# Patient Record
Sex: Female | Born: 1960 | Race: White | Hispanic: No | Marital: Married
Health system: Midwestern US, Academic
[De-identification: ages and names within clinical notes are randomized; demographics above are authoritative.]

---

## 2017-07-27 IMAGING — CR CHEST
2 series · 2 of 2 positions shown · non-contrast
Comparison: none

[chest pa]
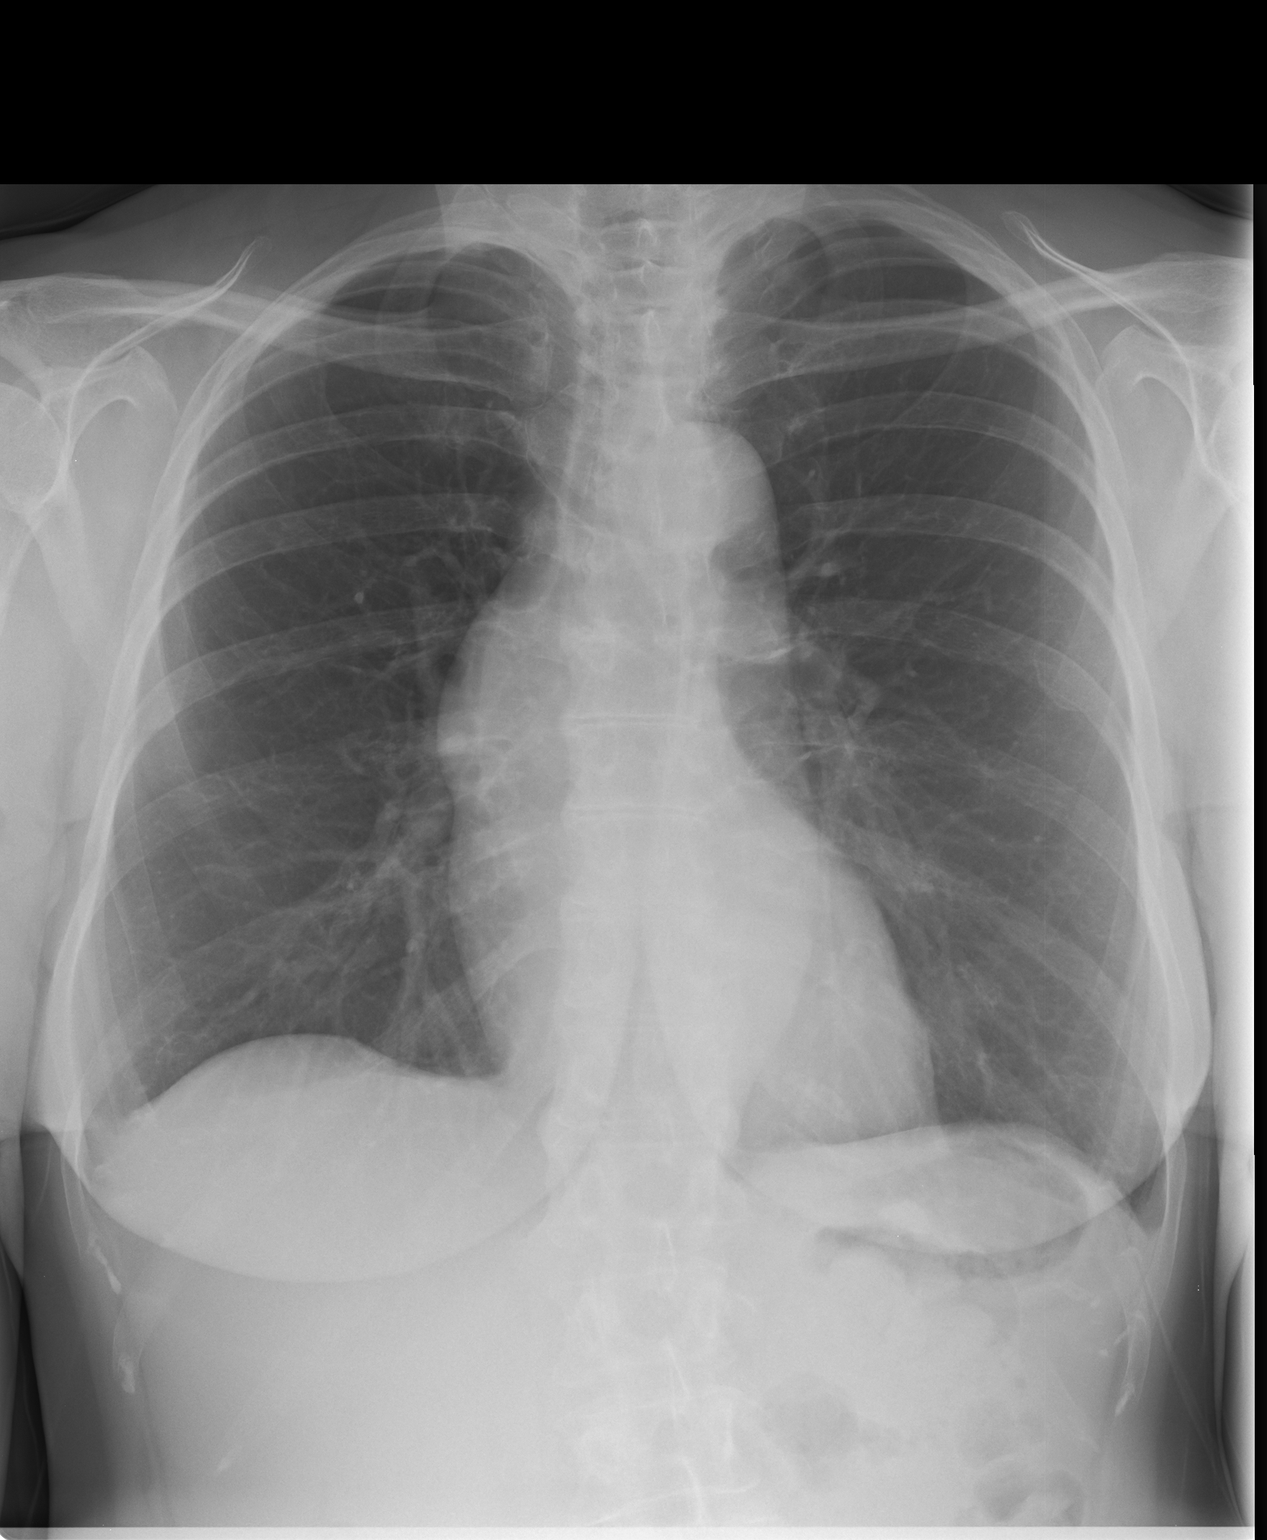

[chest lat]
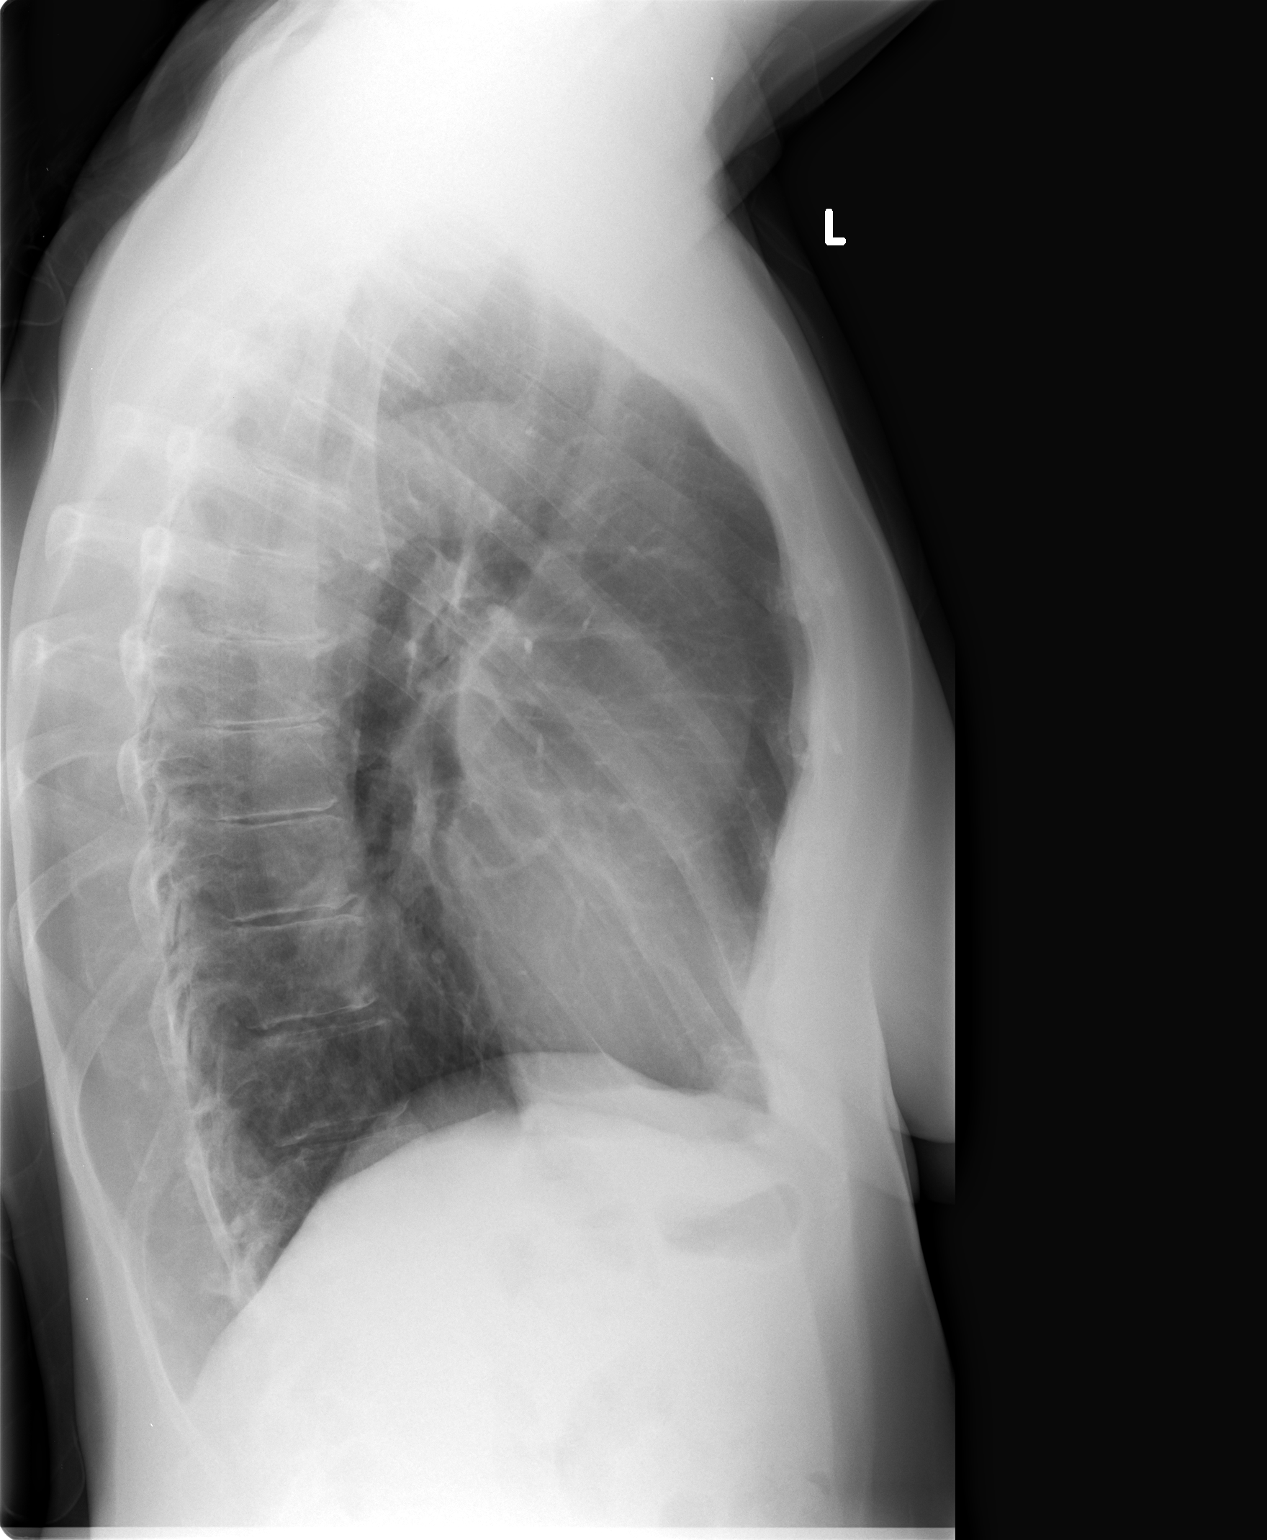

[2 of 2 positions shown; findings below may reference images not displayed]

DIAGNOSTIC STUDIES

EXAM

Chest radiographs.

INDICATION

SEE DIAGNOSIS
COUGH - DENIES SX HX TO CHEST - AK

TECHNIQUE

PA and lateral chest views.

COMPARISONS

None.

FINDINGS

The lungs are clear without consolidation, effusion, or pneumothorax. The cardiomediastinal
silhouette is not widened. Mild thoracic spondylosis.

IMPRESSION

No acute cardiopulmonary pathology.

## 2017-10-10 ENCOUNTER — Encounter: Admit: 2017-10-10 | Discharge: 2017-10-11

## 2018-06-08 ENCOUNTER — Encounter: Admit: 2018-06-08 | Discharge: 2018-06-09

## 2018-06-20 ENCOUNTER — Encounter: Admit: 2018-06-20 | Discharge: 2018-06-21

## 2018-08-10 ENCOUNTER — Encounter: Admit: 2018-08-10 | Discharge: 2018-08-11

## 2018-08-21 ENCOUNTER — Encounter: Admit: 2018-08-21 | Discharge: 2018-08-21

## 2018-09-15 ENCOUNTER — Encounter: Admit: 2018-09-15 | Discharge: 2018-09-15

## 2018-09-15 NOTE — Telephone Encounter
Called pt regarding upcoming appt. Pt choose tele health appt and will change appt to tele health once pt is active in mychart. Send pt mychart link via email. Pt states she had images done at [facility].     CT scan, MRI and X-ray requested via cloud at [facility]

## 2018-09-21 ENCOUNTER — Encounter: Admit: 2018-09-21 | Discharge: 2018-09-21

## 2018-09-25 ENCOUNTER — Encounter: Admit: 2018-09-25 | Discharge: 2018-09-25

## 2018-09-25 ENCOUNTER — Ambulatory Visit: Admit: 2018-09-25 | Discharge: 2018-09-25

## 2018-09-25 DIAGNOSIS — I10 Essential (primary) hypertension: Secondary | ICD-10-CM

## 2018-09-25 DIAGNOSIS — M4800 Spinal stenosis, site unspecified: ICD-10-CM

## 2018-09-25 DIAGNOSIS — R51 Headache: ICD-10-CM

## 2018-09-25 DIAGNOSIS — M5030 Other cervical disc degeneration, unspecified cervical region: ICD-10-CM

## 2018-09-25 DIAGNOSIS — E079 Disorder of thyroid, unspecified: ICD-10-CM

## 2018-09-25 DIAGNOSIS — M5136 Other intervertebral disc degeneration, lumbar region: ICD-10-CM

## 2018-09-25 DIAGNOSIS — IMO0002 Ulcer: ICD-10-CM

## 2018-09-25 DIAGNOSIS — R011 Cardiac murmur, unspecified: ICD-10-CM

## 2018-09-25 DIAGNOSIS — D539 Nutritional anemia, unspecified: Principal | ICD-10-CM

## 2018-09-25 DIAGNOSIS — F329 Major depressive disorder, single episode, unspecified: ICD-10-CM

## 2018-09-25 DIAGNOSIS — M48062 Spinal stenosis, lumbar region with neurogenic claudication: Principal | ICD-10-CM

## 2018-09-25 DIAGNOSIS — C801 Malignant (primary) neoplasm, unspecified: ICD-10-CM

## 2018-09-25 DIAGNOSIS — F419 Anxiety disorder, unspecified: ICD-10-CM

## 2018-09-25 DIAGNOSIS — M5134 Other intervertebral disc degeneration, thoracic region: ICD-10-CM

## 2018-09-25 DIAGNOSIS — M2550 Pain in unspecified joint: ICD-10-CM

## 2020-02-26 ENCOUNTER — Encounter: Admit: 2020-02-26 | Discharge: 2020-02-26 | Payer: MEDICARE

## 2020-02-26 DIAGNOSIS — M48062 Spinal stenosis, lumbar region with neurogenic claudication: Secondary | ICD-10-CM

## 2020-03-02 ENCOUNTER — Encounter: Admit: 2020-03-02 | Discharge: 2020-03-02 | Payer: MEDICARE

## 2020-03-03 ENCOUNTER — Ambulatory Visit: Admit: 2020-03-03 | Discharge: 2020-03-03 | Payer: MEDICARE

## 2020-03-03 ENCOUNTER — Encounter: Admit: 2020-03-03 | Discharge: 2020-03-03 | Payer: MEDICARE

## 2020-03-03 DIAGNOSIS — IMO0002 Ulcer: Secondary | ICD-10-CM

## 2020-03-03 DIAGNOSIS — D539 Nutritional anemia, unspecified: Secondary | ICD-10-CM

## 2020-03-03 DIAGNOSIS — M4800 Spinal stenosis, site unspecified: Secondary | ICD-10-CM

## 2020-03-03 DIAGNOSIS — M48062 Spinal stenosis, lumbar region with neurogenic claudication: Secondary | ICD-10-CM

## 2020-03-03 DIAGNOSIS — C801 Malignant (primary) neoplasm, unspecified: Secondary | ICD-10-CM

## 2020-03-03 DIAGNOSIS — F419 Anxiety disorder, unspecified: Secondary | ICD-10-CM

## 2020-03-03 DIAGNOSIS — M4316 Spondylolisthesis, lumbar region: Secondary | ICD-10-CM

## 2020-03-03 DIAGNOSIS — M2550 Pain in unspecified joint: Secondary | ICD-10-CM

## 2020-03-03 DIAGNOSIS — I10 Essential (primary) hypertension: Secondary | ICD-10-CM

## 2020-03-03 DIAGNOSIS — E079 Disorder of thyroid, unspecified: Secondary | ICD-10-CM

## 2020-03-03 DIAGNOSIS — R519 Generalized headaches: Secondary | ICD-10-CM

## 2020-03-03 DIAGNOSIS — M5030 Other cervical disc degeneration, unspecified cervical region: Secondary | ICD-10-CM

## 2020-03-03 DIAGNOSIS — M5134 Other intervertebral disc degeneration, thoracic region: Secondary | ICD-10-CM

## 2020-03-03 DIAGNOSIS — R011 Cardiac murmur, unspecified: Secondary | ICD-10-CM

## 2020-03-03 DIAGNOSIS — F32A Depression: Secondary | ICD-10-CM

## 2020-03-03 DIAGNOSIS — M5136 Other intervertebral disc degeneration, lumbar region: Secondary | ICD-10-CM

## 2020-03-24 ENCOUNTER — Encounter: Admit: 2020-03-24 | Discharge: 2020-03-24 | Payer: MEDICARE

## 2020-12-16 ENCOUNTER — Encounter: Admit: 2020-12-16 | Discharge: 2020-12-16 | Payer: MEDICARE

## 2020-12-17 ENCOUNTER — Encounter: Admit: 2020-12-17 | Discharge: 2020-12-17 | Payer: MEDICARE

## 2020-12-17 DIAGNOSIS — C50912 Malignant neoplasm of unspecified site of left female breast: Secondary | ICD-10-CM

## 2020-12-19 ENCOUNTER — Encounter: Admit: 2020-12-19 | Discharge: 2020-12-19 | Payer: MEDICARE

## 2020-12-20 ENCOUNTER — Encounter: Admit: 2020-12-20 | Discharge: 2020-12-20 | Payer: MEDICARE

## 2020-12-21 NOTE — Telephone Encounter
Navigation Intake Assessment    Patient Name:  Cindy Randolph  DOB: 18-Jul-1960  Insurance:  [employer] Bison [employer] and [employer]  Direct Referral: None  Appointment Info:    Future Appointments   Date Time Provider Department Center   12/24/2020 10:15 AM Massie Kluver, MD IC1EXRM [place] Exam   12/24/2020 10:15 AM [facility] IC1 - BIOIMPEDENCE SPECTROSCOPY IC1EXRM [place] Exam   12/24/2020 11:00 AM Oletta Cohn, MD IC1EXRM [place] Exam   12/24/2020 12:00 PM [facility] BREAST RAD ONC PROVIDER IC1EXRM [place] Exam   12/26/2020  9:15 AM MRI - IC ROOM 2 (3T) IC1MRI [facility] Radiolog     Diagnosis & Reason for Visit:  Left breast ILC      Physician Info:  ? Referring Physician:  Dr. Erskine Emery  ? Contact Name & Number:  [facility]    Location of Films:   PACS    Location of Pathology:  Patient notified outside pathology slides will be obtained for review by [place] pathologist and a facility and professional fee will be billed to their insurance. Has been requested but not yet received.    History of Present Illness:  Cindy Randolph, age 60, felt left breast twinges for approximately the last 2 months. She reported this to her provider and imaging was ordered and performed at [facility]. She was diagnosed on follow up biopsy with left breast cancer. The patient has no breast complaints.      TIMELINE:  [facility]  11/25/20   Bilateral screening mammo    12/04/20    Left breast diagnostic mammo and left breast targeted US.      12/10/20   Left breast stereotactic biopsy w/post biopsy mammo for clip verification.        While the patient lived in [place] almost 22 yrs ago she reports having had left breast biopsy that was benign. No other breast biopsy or breast surgery.    Allergies reviewed and verified with the patient, and documented in Epic:  Yes    Family History of Breast Cancer:   Breast Cancer: Mother was diagnosed at the age 43.  Ovarian Cancer: None  Prostate Cancer: None    Menopausal Status:  Post menopausal    Personal History of Other Cancers: In 1980 the patient reports having thyroid cancer, removal of the thyroid and placed on synthroid at [facility].    NEEDS Assessment:    Genetic Counseling:  Genetic Assessment: No identified risk factors    Genetic Intervention: Provided information about available services     Nutrition:  Nutrition Intervention: Provided information about available services    Social & Financial:  Social and Financial Assessment: Reports adequate support system;No needs identified  Tobacco assessment last 30 days: Patient has not used tobacco products within the last 30 days  Social and Financial Intervention: Provided information about available services    Spiritual & Emotional:  Spiritual and Emotional Assessment: Reports adequate support system;No needs identified  Spiritual and Emotional Intervention: Provided information about available services;Emotional Support provided    Physical:  Fall Risk: None identified     Communication:  Communication Barrier: No     Onc Fertility:   Onc Fertility Assessment: Female patient is postmenopausal or has had hysterectomy    Patient Education  COVID-19 guidelines reviewed with patient, including: visitor and universal masking policies, and a temperature check at the facility entrance upon arrival.    Topics Discussed  We discussed in general all of the  many resources available at [facility] and the patient verbalized understanding that if she ever needs a referral placed for her, she will let us know.

## 2020-12-22 ENCOUNTER — Encounter: Admit: 2020-12-22 | Discharge: 2020-12-22 | Payer: MEDICARE

## 2020-12-23 NOTE — Progress Notes
Name: Cindy Randolph          MRN: [id number]      DOB: [birth year]      AGE: 60 y.o.   DATE OF SERVICE: 12/24/2020    Subjective:             Reason for Visit:  Heme/Onc Care      Cindy Randolph is a 60 y.o. female.     Cancer Staging  Malignant neoplasm of left breast in female, estrogen receptor positive (HCC)  Staging form: Breast, AJCC 8th Edition  - Clinical stage from 12/22/2020: Stage IA (cT1c, cN0, cM0, G1, ER+, PR+, HER2-) - Signed by Massie Kluver, MD on 12/22/2020      History of Present Illness  Cindy Randolph presents as a new patient consultation for evaluation of her recently diagnosed left breast cancer.    She noted intermittent left breast discomfort over the last 2 months.  She had routine screening mammogram 11/25/2020 at [facility]  This revealed scattered fibroglandular tissue density.  Benign appearing microcalcifications.  Unchanged right upper central anterior breast low-density circumscribed masses dating back to 2013.  Focal asymmetry located within the left posterior central breast, 12 o'clock position, 7.3 cm from the nipple.    Left diagnostic mammogram 12/04/2020 revealed irregular density at the 12:00 left breast persisted.    Left breast ultrasound 12/04/2020 revealed ill-defined area which was slightly hypoechoic at the 12 o'clock position, 7 cm from nipple.  This was indeterminate and biopsy was recommended.    12/10/2020 ultrasound-guided biopsy revealed a grade 1 invasive lobular carcinoma ER 91 to 100%, PR 11 to 20%, HER2 1+, Ki-67 of 2 to 5% at the 12 o'clock position.    Past medical history: Thyroid cancer diagnosed in 54    Social history: She lives with her husband and 6 children.  She does not smoke or abuse alcohol.  She is a retired Engineer, civil (consulting).    Family history: Her mother had breast cancer at the age of 41.  There is no family history of ovarian or prostate cancer.    Reproductive health:  Age at first Menarche:  88  Age at First Live Birth:  65  Age at Menopause:  39  Gravida:  8  Para: 8  Breastfeeding:  yes     Review of Systems   Constitutional: Negative for activity change, appetite change, fatigue and fever.   HENT: Negative for congestion and sore throat.    Eyes: Negative.    Respiratory: Negative for cough and shortness of breath.    Cardiovascular: Negative.    Gastrointestinal: Negative for abdominal distention, constipation and diarrhea.   Endocrine: Negative.    Genitourinary: Negative for difficulty urinating.   Musculoskeletal: Negative for arthralgias, myalgias, neck pain and neck stiffness.   Skin: Negative for rash.   Neurological: Negative for dizziness, light-headedness and numbness.   Hematological: Negative for adenopathy. Does not bruise/bleed easily.   Psychiatric/Behavioral: Negative.          Objective:         ? ALPRAZolam (XANAX) 0.25 mg tablet Take 0.25 mg by mouth at bedtime as needed for Anxiety.   ? CANNABIDIOL (CBD) 25 MG OIL CAPSULE (COMPOUND) Take 25 mg by mouth twice daily.   ? diclofenac sodium DR (VOLTAREN) 50 mg tablet Take 50 mg by mouth twice daily.   ? Lactobacillus acidophilus (PROBIOTIC) 10 billion cell cap Take  by mouth daily.   ? levothyroxine (SYNTHROID) 100 mcg  tablet Take 100 mcg by mouth daily.   ? lisinopriL (ZESTRIL) 2.5 mg tablet Take 2.5 mg by mouth twice daily.   ? omega 3-dha-epa-fish oil (FISH OIL) 100-160-1,000 mg cap Take  by mouth three times weekly.   ? traMADoL (ULTRAM) 50 mg tablet Take 50 mg by mouth as Needed.   ? vitamins, B complex tab Take 1 tablet by mouth daily.   ? vitamins, multiple cap Take 1 capsule by mouth daily.     Vitals:    12/24/20 1031   BP: 123/84   BP Source: Arm, Right Upper   Pulse: 77   Temp: 36 ?C (96.8 ?F)   SpO2: 98%   TempSrc: Temporal   PainSc: Three   Weight: 67.6 kg (149 lb 0.5 oz)   Height: 160 cm (5' 2.99)     Body mass index is 26.41 kg/m?Marland Kitchen     Pain Score: Three  Pain Loc: Generalized         Pain Addressed:  N/A    Patient Evaluated for a Clinical Trial: No treatment clinical trial available for this patient.     [place] Cooperative Oncology Group performance status is 0, Fully active, able to carry on all pre-disease performance without restriction.Marland Kitchen     Physical Exam  Exam conducted with a chaperone present (Margot).   Constitutional:       Appearance: She is well-developed.   HENT:      Head: Normocephalic.   Eyes:      Conjunctiva/sclera: Conjunctivae normal.      Pupils: Pupils are equal, round, and reactive to light.   Cardiovascular:      Rate and Rhythm: Normal rate and regular rhythm.      Heart sounds: Normal heart sounds. No murmur heard.    No gallop.   Pulmonary:      Effort: Pulmonary effort is normal. No respiratory distress.      Breath sounds: Normal breath sounds. No wheezing or rales.   Chest:   Breasts:      Right: Inverted nipple present. No supraclavicular adenopathy.      Left: Inverted nipple present. No supraclavicular adenopathy.         Abdominal:      General: Bowel sounds are normal. There is no distension.      Palpations: Abdomen is soft.   Musculoskeletal:         General: Normal range of motion.      Cervical back: Normal range of motion and neck supple.   Lymphadenopathy:      Cervical: No cervical adenopathy.      Upper Body:      Right upper body: No supraclavicular or epitrochlear adenopathy.      Left upper body: No supraclavicular or epitrochlear adenopathy.   Neurological:      Mental Status: She is alert and oriented to person, place, and time.               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  I reviewed with her the typical approach to patient with breast cancer and this involves multimodality therapy including surgery, medical oncology and radiation oncology.  She is being seen as part of our breast multidisciplinary clinic met with Dr. Leveda Anna from breast surgery as well as Dr. Berton Bon from radiation oncology.    With her having invasive lobular carcinoma a breast MRI was ordered and this is scheduled for August  5.    I reviewed with her that my role is the role of systemic therapy.  Her tumor is strongly positive for the estrogen and progesterone receptor and she would benefit from adjuvant endocrine therapy.  We discussed tamoxifen and the aromatase inhibitors in detail.  Given that she is postmenopausal the aromatase inhibitors are slightly better than tamoxifen.  We also reviewed that not all patients benefit from systemic chemotherapy.  Based on the results of her definitive pathology we will order an Oncotype DX to see her risk of recurrence and therefore if she has a benefit to systemic chemotherapy.    I will see her back after her definitive surgery to finalize her plans.    She is concerned about her inherited risk and I therefore referred her to a Dentist for evaluation.  I did discuss that this is most likely that is low risk and she is negative.  We will make a routine consult so she will proceed with surgery but in the future will meet with genetic counseling.

## 2020-12-24 ENCOUNTER — Encounter: Admit: 2020-12-24 | Discharge: 2020-12-24 | Payer: MEDICARE

## 2020-12-24 DIAGNOSIS — E039 Hypothyroidism, unspecified: Secondary | ICD-10-CM

## 2020-12-24 DIAGNOSIS — E079 Disorder of thyroid, unspecified: Secondary | ICD-10-CM

## 2020-12-24 DIAGNOSIS — C801 Malignant (primary) neoplasm, unspecified: Secondary | ICD-10-CM

## 2020-12-24 DIAGNOSIS — C50912 Malignant neoplasm of unspecified site of left female breast: Secondary | ICD-10-CM

## 2020-12-24 DIAGNOSIS — M4800 Spinal stenosis, site unspecified: Secondary | ICD-10-CM

## 2020-12-24 DIAGNOSIS — R519 Generalized headaches: Secondary | ICD-10-CM

## 2020-12-24 DIAGNOSIS — B999 Unspecified infectious disease: Secondary | ICD-10-CM

## 2020-12-24 DIAGNOSIS — F32A Depression: Secondary | ICD-10-CM

## 2020-12-24 DIAGNOSIS — F419 Anxiety disorder, unspecified: Secondary | ICD-10-CM

## 2020-12-24 DIAGNOSIS — M2550 Pain in unspecified joint: Secondary | ICD-10-CM

## 2020-12-24 DIAGNOSIS — I10 Essential (primary) hypertension: Secondary | ICD-10-CM

## 2020-12-24 DIAGNOSIS — R011 Cardiac murmur, unspecified: Secondary | ICD-10-CM

## 2020-12-24 DIAGNOSIS — D539 Nutritional anemia, unspecified: Secondary | ICD-10-CM

## 2020-12-24 DIAGNOSIS — M5134 Other intervertebral disc degeneration, thoracic region: Secondary | ICD-10-CM

## 2020-12-24 DIAGNOSIS — M549 Dorsalgia, unspecified: Secondary | ICD-10-CM

## 2020-12-24 DIAGNOSIS — M5136 Other intervertebral disc degeneration, lumbar region: Secondary | ICD-10-CM

## 2020-12-24 DIAGNOSIS — IMO0002 Ulcer: Secondary | ICD-10-CM

## 2020-12-24 DIAGNOSIS — C73 Malignant neoplasm of thyroid gland: Secondary | ICD-10-CM

## 2020-12-24 DIAGNOSIS — Z9189 Other specified personal risk factors, not elsewhere classified: Secondary | ICD-10-CM

## 2020-12-24 DIAGNOSIS — M5030 Other cervical disc degeneration, unspecified cervical region: Secondary | ICD-10-CM

## 2020-12-24 DIAGNOSIS — C50112 Malignant neoplasm of central portion of left female breast: Secondary | ICD-10-CM

## 2020-12-24 NOTE — Progress Notes
Bioimpedance Spectroscopy performed.  Advised patient that additional information will be sent via Mychart (preferred) or phone if indicated by the lymphedema nurse within 24 hours.

## 2020-12-24 NOTE — Patient Instructions
We appreciate the opportunity to see you in clinic today. Please send a My chart message or call if you have any questions regarding your visit with Dr. Leveda Anna.     Take care,     Shanon Brow, RN, CBCN   [phone]      BREAST SURGERY PRE OP INSTRUCTIONS-    EATING - Nothing to eat or drink after midnight the night before your surgery.  The morning of your surgery youmay brush your teeth and rinse your mouth, but your stomach should be completely empty.  The only exception is taking medication if specifically instructed by Anesthesia.  No water, No coffee, No gum, No candy or mints!     WHAT TO WEAR - Dress comfortably and please do not wear or bring valuables.  You will go home from surgery with a post surgical sports bra in place - Please wear this until your post-op appointment unless instructed otherwise.  No underwire bras for 4 weeks after surgery.  Wear post-Op bra or supportive bra with no underwire for 4 weeks after surgery.      MEDICATIONS:  -You will be instructed by anesthesia [facility]) on what prescriptions and medications you should or should not take on the morning of surgery.  For medication questions, call [phone].     -Talk to your doctor about stopping warfarin, Coumadin, and Plavix or other blood thinners that you are taking. You may have to take injections of other agents during the time right before your surgery.     -Stop all herbal, naturopathic, or over the counter supplements 10 days prior to surgery.     -Stop taking aspirin, products containing aspirin, and ibuprofen at least 10 days prior to surgery (Motrin, Advil, Aleve, etc.) unless instructed otherwise by your doctor.  Tylenol is okay to take.      -Stop taking dietary supplements 7 days prior to surgery. Specifically this includes Multivitamin, Vitamin E, Fish Oil and Omega 3.     ALCOHOL/SMOKING - Avoid alcohol 5 days prior to surgery.Smoking increases your risk of complications during and after your operation. Quitting 4 to 6 weeks before your operation and staying smoke-free can decrease your rate of wound complications.      BATHING - Shower using a bar of ?Gold? Dial antibacterial soap. Do not shave any of the surgical sites or put on lotion, powder, perfume or deodorant after you shower.     HOSPITAL DISCHARGE - You MUST have a driver take you home from the hospital and someone to stay with you the first 24 hours after surgery.     INSURANCE - We will contact your insurance for pre-authorization of your surgery.  You will be notified if your insurance is not authorizing surgery.  If you have any questions, we can get you in contact with our Contractor.                  BREAST SURGERY POST-OPERATIVE INSTRUCTIONS-    DIET  -You have no dietary restrictions. Please continue with a healthy balanced diet.  -Do not drink alcohol while taking narcotic pain medication.    ACTIVITY  -No driving while taking narcotic pain medication.  -No lifting greater than 10 pounds.   -No repetitive motions with the arm on the side of surgery (ie. laundry, vacuuming, washing windows/floors, etc.)  -Begin the exercises found in your treatment guide 24 hours after surgery.  -You may resume your normal activity once cleared by your surgeon. This can  be discussed at your post-operative appointment.    INCISION CARE  -Keep your incision clean and dry.    -You may shower 24 hours following your procedure. It is safe for the incisions to get wet unless otherwise instructed by your surgeon. Pat incisions dry after showering.  -Avoid applying deodorants, powders, creams, lotions, etc. to your incision for 4 weeks.  -Usually, there are no stitches to be removed. You will either have a Dermabond (skin glue) or Therabond (silver covering) dressing over your incision.    *Dermabond is an antimicrobial barrier that will begin to flake after approximately 1 week. Do not peel to remove prior to flaking. Do NOT apply ointments to your incisions over the Dermabond, as these substances can weaken the barrier and allow for skin edge separation.    *Therabond is a silver impregnated dressing that promotes wound healing. This dressing is to stay in place until your surgeon instructs you to remove it.  -Your incision should gradually look better each day.  If you notice unusual swelling, redness, drainage, have increasing pain at the site, or have a fever greater than 100 degrees, notify your physician immediately.  -You may have a pink post-surgery bra and fluffs in place.  This can be removed 24 hours after surgery to shower. Continue to wear the pink bra or a snug sports bra until your post-operative appointment. Do NOT wear underwire bras.    SIGNS & SYMPTOMS  -Please contact your doctor if you have any of the following symptoms: temperature higher than 100 degrees F, uncontrolled pain, persistent nausea and/or vomiting, difficulty breathing or breast redness, leakage or incision breakdown/opening.     IF YOU HAVE A DRAIN  *WASH HANDS PRIOR TO ANY HANDLING OF DRAIN.   -Please write down daily (total for 24 hours) drain output and empty multiple times a day if needed or according to physician's instructions. Record the output in milliliters.  -There is a CHG drain dressing in place over the entry site of your drain. This is to remain in place until removed by your surgeon. There is an antimicrobial gel in the center rectangle of the dressing. This is normal and not a sign of fluid leaking. If there is physical liquid leaking on the skin outside the area of the drain, please notify your surgeons office.   -To empty the drain, open the lid on top of the bulb.  Pour the fluid into a measuring cup and record.  Close by squeezing the bulb until as much air as possible is removed, then cap the lid to recreate suction.   -Strip the drain twice daily or as needed by pinching the tubing near the skin with both hands.  Keep hand closest to you steady and with the other, slowly squeeze the liquid toward the bulb.  This will prevent any clots or debris from clogging the drain.   -It is okay to shower with the drain and dressing in place.  Pat the dressing dry after showering.    *When showering consider placing your back to the water and letting the water run over the surgical areas. Soap and water contact over the surgical sites is OK. Please do not soak or scrub the sites.     *A lanyard can be used to hook the drains around your neck to allow freedom of hands for showering.    -Please bring the drain record to your next clinic appointment.   -Secure drains to your post-operative bra or use  a safety pin to pin to clothing. Do not let drains dangle without support.   -DO NOT TRY TO PUSH THE DRAIN BACK IN IF IT GETS PULLED OUT (even a little bit), AS THIS IS A BIG INFECTION RISK.  If you have any problems with the drain (ex. large amounts of fluid, very bloody fluid, or loss of suction), please call your doctor.    MEDICATIONS  -Do not exceed 4000mg  of Tylenol (acetaminophen) in a 24-hour period. The pain medication prescribed may have a narcotic and Tylenol together. Please verify this on your medication bottle prior to taking additional Tylenol.    OPIOID (NARCOTIC) PAIN MEDICATION SAFETY  -We care about your comfort and believe you may need opioid medications at this time to treat your pain.  An opioid is a strong pain medication.  It is only available by prescription for moderate to severe pain.  Usually, these medications are used for only a short time to treat pain.    -When used the right way, opioids are safe and effective medications to treat your pain.  Yet, when used in the wrong way, opioids can be dangerous for you or others.  Opioids do not work for everyone.  Most patients do not get full relief of their pain from opioid medication; full relief of your pain may not be possible.    -For your safety, we ask you to follow these instructions:  *Only take your opioid medication as prescribed.  If your pain is not controlled with the prescribed dose or the medication is not lasting long enough, call your doctor.  *Do not break or crush your opioid medication unless your doctor or pharmacist says you can.  With certain medications this can be dangerous and may cause death.  *Never share your medication with others even if they appear to have a good reason.  Never take someone else?s pain medication - this is dangerous and illegal.  Overdoses and deaths have occurred.  *Keep your opioid medications safe, as you would with cash, in a lock box or similar container.  *Make sure your opioids are going to be secure, especially if you are around children or teens.   *Talk with your doctor or pharmacist if you have questions about taking other medications.   *Avoid driving, operative machinery or drinking alcohol while taking opioid pain medication.  This may be unsafe.     CONSTIPATION PREVENTION  -Most patients require narcotic pain medication following surgery and these medications frequently cause significant constipation. Constipation can also occur post-operatively due to anesthesia, inactivity and decrease in oral intake. Constipation can cause discomfort, bloating, nausea and vomiting. Below are recommendations on how to treat and prevent constipation if you experience this following surgery.    -Start a daily laxative regimen as soon as you start any narcotic pain medication or see any deviation from your normal bowel pattern. You will be given a prescription for Senna-S after surgery. If you stay overnight after your surgery, a laxative will be started while you are in the hospital.      -Medications that can help with constipation:    *Senna-S    *Miralax, 1-2 capfuls daily    *Fiber supplements (Fibercon or Benefiber). Follow manufacturer dosing recommendations.                                          You can  also consider dietary fiber options such as prunes or prune juice.    *Probiotics (Bio-K+, Culturelle, Align or Florastor), 1-2 daily. Do not take a probiotic if you are on chemotherapy unless you have received permission from your oncologist.     *Milk of Magnesia 1,2000mg /53mL, 2-4 doses daily. Do not take if you have kidney issues or are on dialysis.     -If you are still having trouble with constipation despite using the above medications for 1-2 days without a bowel movement or have significant discomfort from constipation you may try the below over the counter option. Do not go longer than 2 days without a bowel movement before you try taking additional laxatives.    *Magnesium Citrate (1-2 bottles in 24 hour period). Do not take if you have kidney issues or are on dialysis.       For questions or concerns regarding your hospital stay:  - DURING BUSINESS HOURS (8:00 AM - 4:30 PM):    Call [phone] to speak to your surgeon?s nurse    - AFTER BUSINESS HOURS (4:30 PM - 8:00 AM, on weekends, or holidays):  Call [phone] and ask to page the BREAST SURGERY team

## 2020-12-24 NOTE — Progress Notes
Spoke with Ms. Bossi regarding L RSL Lumpectomy/L SN /pALND +/- IORT  and the confirmed surgery date of TBD. The patient was given detailed instructions about when and where to check in the day of surgery. She was informed that she will receive a call from the surgery department the day prior to the procedure to confirm the arrival time.     Information describing the surgical procedure and pre and post operative instructions have been given to the patient. We will plan to see her 1-2 weeks post op to review final pathology results and assess surgical recovery.     The patient has been informed of the medications that need to be stopped 7-10 days prior to surgery. We have discussed NPO requirements starting at midnight the night before surgery. Reviewed that a driver will need to be present to accompany home due to the influence of anesthesia including narcotics post procedure. The Pre anesthesia department will call to review pre operative instructions. They will decide if an anesthesia visit is needed. Pre op assessment may include lab work, medication review, health history, anesthesia/ surgical history, and any other additional recommended testing.      The patient verbalizes understanding of the information given.  Encouraged the patient to message or call with any questions or concerns.\    [place]lington Calix, RN

## 2020-12-24 NOTE — Progress Notes
Name: Cindy Randolph          MRN: [id number]      DOB: 1960/12/20      AGE: 60 y.o.   DATE OF SERVICE: 12/24/2020            Reason for Visit:  Heme/Onc Care      Cindy Randolph is a 60 y.o. female.     Cancer Staging  Malignant neoplasm of left breast in female, estrogen receptor positive (HCC)  Staging form: Breast, AJCC 8th Edition  - Clinical stage from 12/22/2020: Stage IA (cT1c, cN0, cM0, G1, ER+, PR+, HER2-) - Signed by Cindy Kluver, MD on 12/22/2020    DIAGNOSIS:  Left grade 1 ILC (ER 91-100, PR 11-20, HER 1+, Ki 67 2-5%) at 12:00 dx 11/2020    History of Present Illness    Cindy Randolph is a female who presented to the [facility] on 12/24/2020 at age 88 for left breast cancer.  Cindy Randolph reports noticing Left breast intermittent discomfort over the last 2 months.  Cindy Randolph had Screening mammogram at [facility] on 11/25/2020 which identified a focal asymmetry in the left breast.  Cindy Randolph returned for left diagnostic mammogram and ultrasound on 12/04/2020 which showed an ill -defined hypoechoic area at 12:00 7 cm FTN measuring 1.7 cm.   Cindy Randolph underwent Left ultrasound guided biopsy on 12/10/2020 which revealed grade 1 hormone positive, HER2 negative ILC with associated LCIS. Cindy Randolph has no breast complaints.    Cindy Randolph is scheduled for MRI 12/26/2020    Patient was seen in [facility] today with Cindy Randolph and Cindy Randolph.  We have reviewed  and discussed her history, exam, imaging, pathology and plan.       BREAST IMAGING:  Mammogram:    - Screening mammogram 11/25/2020 ([facility]) revealed scattered fibroglandular tissue density.  Benign appearing microcalcification. Unchanged right upper central anterior breast low density circumscribed masses dating back to 2013.  Focal asymmetry located within the left posterior central breast, 12:00, 7.3 cm FTN most conspicuous on MLO.  Recommend spot compression.    - Left diagnostic mammogram 12/04/2020 ([facility]) revealed irregular density at 12:00 left breast persists. Ultrasound recommended.    Ultrasound:    Left targeted ultrasound 12/04/2020 ([facility]) revealed an ill -defined area which was slightly hypoechoic at 12:00, 7 cm FTN.  This was indeterminate and biopsy was recommended.       REPRODUCTIVE HEALTH:  Age at first Menarche:  26  Age at First Live Birth:  23  Age at Menopause:  38  Gravida:  8  Para: 8  Breastfeeding:  yes    PROCEDURE:  pending  PERTINENT PMH:  Thyroid Cancer (1980- thyroidectomy), HTN   FAMILY HISTORY:  Mother- Breast cancer (65).  No family history of ovarian or prostate cancer.   PHYSICAL EXAM on PRESENTATION:    MEDICAL ONCOLOGY:    Cindy. Neil Randolph  REFERRED BY:  Cindy Randolph       Review of Systems   Musculoskeletal: Positive for arthralgias, back pain, neck pain and neck stiffness.   All other systems reviewed and are negative.        Objective:         ? ALPRAZolam (XANAX) 0.25 mg tablet Take 0.25 mg by mouth at bedtime as needed for Anxiety.   ? CANNABIDIOL (CBD) 25 MG OIL CAPSULE (COMPOUND) Take 25 mg by mouth twice daily.   ? diclofenac sodium Cindy (VOLTAREN) 50 mg tablet Take 50 mg by mouth  twice daily.   ? Lactobacillus acidophilus (PROBIOTIC) 10 billion cell cap Take  by mouth daily.   ? levothyroxine (SYNTHROID) 100 mcg tablet Take 100 mcg by mouth daily.   ? lisinopriL (ZESTRIL) 2.5 mg tablet Take 2.5 mg by mouth twice daily.   ? omega 3-dha-epa-fish oil (FISH OIL) 100-160-1,000 mg cap Take  by mouth three times weekly.   ? traMADoL (ULTRAM) 50 mg tablet Take 50 mg by mouth as Needed.   ? vitamins, B complex tab Take 1 tablet by mouth daily.   ? vitamins, multiple cap Take 1 capsule by mouth daily.     Vitals:    12/24/20 1009   BP: 123/84   BP Source: Arm, Right Upper   Pulse: 77   Temp: 36 ?C (96.8 ?F)   SpO2: 98%   TempSrc: Temporal   PainSc: Three   Weight: 67.6 kg (149 lb)   Height: 160 cm (5' 2.99)     Body mass index is 26.4 kg/m?Marland Kitchen     Pain Score: Three  Pain Loc: Generalized (arthritis)    Fatigue Scale: 0-None    Pain Addressed: N/A    Patient Evaluated for a Clinical Trial: No treatment clinical trial available for this patient.     [place] Cooperative Oncology Group performance status is 0, Fully active, able to carry on all pre-disease performance without restriction.Marland Kitchen     Physical Exam  Vitals reviewed.   Constitutional:       Appearance: Cindy Randolph is well-developed.   HENT:      Head: Normocephalic.   Cardiovascular:      Rate and Rhythm: Normal rate.   Pulmonary:      Effort: Pulmonary effort is normal.   Chest:   Breasts:      Right: No swelling, bleeding, inverted nipple, mass, nipple discharge, skin change, tenderness, axillary adenopathy or supraclavicular adenopathy.      Left: No swelling, bleeding, inverted nipple, mass, nipple discharge, skin change, tenderness, axillary adenopathy or supraclavicular adenopathy.         Abdominal:      Palpations: Abdomen is soft.   Musculoskeletal:         General: Normal range of motion.      Cervical back: Normal range of motion.   Lymphadenopathy:      Upper Body:      Right upper body: No supraclavicular, axillary or pectoral adenopathy.      Left upper body: No supraclavicular, axillary or pectoral adenopathy.   Skin:     General: Skin is warm and dry.   Neurological:      Mental Status: Cindy Randolph is alert.   Psychiatric:         Behavior: Behavior normal.         Thought Content: Thought content normal.         Judgment: Judgment normal.               Assessment and Plan:  DIAGNOSIS:  Left grade 1 ILC (ER 91-100, PR 11-20, HER 1+, Ki 67 2-5%) at 12:00 dx 11/2020    I met with the Cindy Randolph and her husband, Cindy Randolph, today to discuss her recent diagnosis of breast cancer.  In summary, this is a clinical stage 1A LEFT breast cancer.  We discussed surgical options at this time.  They include a Left localized partial mastectomy/sentinel node biopsy with possible axillary node dissection vs a Left mastectomy/sentinel node biopsy with possible axillary node dissection with or without immediate  reconstruction.  Cindy Randolph understands that there is equal long term survival between these options; however, there is increased recurrence with the PM option. This can be decreased with radiation therapy. If breast conservation is elected, the need for free margins, the possibility of re -excision to achieve free margins, and the need for post-operative radiotherapy were explained. The approach to nodal staging was also described, including the technique, risks and benefits of sentinel node biopsy, the possible need for axillary dissection, and the long -term sequelae of this procedure.     Cindy Randolph is most interested in [facility].  Breast MRI was discussed with the patient. The indications, risks, and benefits of MRI evaluation were discussed. It was explained that the advantage of breast MRI is increased sensitivity over other breast imaging and therefore, we may find something that was not previously seen. The disadvantage, is that due to the increased sensitivity there is up to a 20% rate of false positives, which may lead to additional biopsies that are negative. There is a 10-15% risk of finding another breast cancer in the ipsilateral breast and a 3-4% change in the contralateral breast.  Additionaly, there is lack of evidence regarding improvement of long or short -term outcomes, and the fact that women undergoing MRI tend to have larger resections, and it is unknown if these are necessary. After this discussion we decided to proceed with breast MRI at this time. This is scheduled for 12/26/2020    The indication and process of radioactive seed localization was discussed, and the seed will be placed 1-5 days prior to surgery    We discussed the results of the Z.11 trial. If Cindy Randolph has only one or 2 positive sentinel lymph nodes with at least another one negative, we can forego completion axillary lymph node dissection. Cindy Randolph will require whole breast radiation therapy and consideration of adjuvant systemic therapy (endocrine or chemotherapy)    With regard to systemic therapy, final recommendation will be made following receipt of final pathology post -op, but the possibility of endocrine therapy and  chemotherapy depending on tumor marker profile was outlined. Cindy Randolph is meeting with Cindy Randolph today to discuss this further.  Cindy Randolph will also discuss genetic testing with him.     Cindy Randolph will meet with Cindy Cindy Randolph to discuss adjuvant radiation therapy.  We reviewed that there are different options for radiation after lumpectomy, generally ?traditional? external beam radiation (EBRT) and intraoperative radiation (IORT). From a surgical oncology standpoint, Cindy Randolph is a candidate for IORT but final decision for radiation approach is pending consultation with the radiation oncologist. We reviewed the following information about IORT:  ?IORT is not part of a research study or clinical trial. It is an FDA approved treatment option for appropriately selected breast cancer patients.  ?For a majority of patients (85%), IORT is the only radiation needed. 15% of patients will have ?higher risk findings? identified in their final surgical pathology report and will still need EBRT. For these patients, the EBRT treatment would be shortened since IORT was already administered. The radiation oncologist will give the final recommendation regarding indications for EBRT following surgery.   ?IORT treatment adds between 20-40 minutes to the operative time. During surgery, the lumpectomy is performed in the same way and there is no difference in how the breast tissue and incision are closed. There are no different postoperative instructions for someone who has a lumpectomy with or without IORT. The risks of postop infection requiring antibiotics or postop wound healing problems are low (1-2%).  At [facility], these surgical risks are the same for patients who do lumpectomy with or without IORT.   ?Long-term follow -up results for IORT demonstrate this is a very safe treatment.  With IORT, there is a 1% higher chance of recurrence in the breast; this is considered equal to EBRT in medical terms.   ?If a patient has a recurrence after IORT, Cindy Randolph may have lumpectomy again, followed by EBRT. In contrast, patients who have EBRT and have a recurrence are usually recommended to have a mastectomy.   ?Patients treated with IORT have better overall survival based on a single prior study, but the reasons for this association are unclear. Whether or not this applies to patients outside this single prior study is unclear. No studies have indicated worse survival with IORT.     We also discussed the role of oncoplastic closure if the MRI shows larger volume disease or if Cindy Randolph does not proceed with IORT.  If Cindy Randolph is interested in this, we will place referral to PRS.     Baseline BIS done today. Plan LERN education postoperatively.     Cindy Randolph and her husband asked thoughtful questions and I believe that they are well informed.We will proceed with scheduling.  I have supplied them with copies of our worksheets as well as the pathology reports.       Additional imaging ordered today: MRI of breasts scheduled 12/26/2020  Medical oncology: Established with Cindy. Neil Randolph today in [facility] clinic  Radiation oncology:Established with Cindy. Berton Randolph today in [facility] clinic  Genetic referral: Yes per Cindy Randolph   Plastic surgery referral: N/A unless decides oncoplastic closure   Lymphedema evaluation: Scheduled for BIS; LERN postop education      Operative plan: BCT; final treatment pending MRI     Nigel Berthold, PA-C     ATTESTATION    I personally performed the key portions of the E/M visit, discussed case with Physician Assistant and concur with documentation of history, physical exam, assessment, and treatment plan unless otherwise noted.     Stage IA LEFT breast cancer     MRI     Genetics     Elenora Gamma - ?IORT    ?PRS    Staff name:  Cindy Kluver, MD Date:  12/24/2020

## 2020-12-24 NOTE — Progress Notes
Lymphedema Prevention Bioimpedance Spectroscopy (BIS) Monitoring    BIS obtained for baseline measurements prior to surgery.    Baseline BIS = 2.0, Left  No notification indicated     Dominant hand: right handed  BMI: 26.4    A BIS test (Bioimpedance Spectroscopy test) was done in coordination with you breast surgery appointment today for baseline lymphedema evaluation only. You are not at risk for developing lymphedema until after surgery.  The BIS test is a scale used to help detect early signs of lymphedema and your baseline measurement from today will be used to aid with future lymphedema monitoring in the future.   Our clinic will meet with you for lymphedema education post-operatively and will explain this test in greater detail at that time.

## 2020-12-25 ENCOUNTER — Encounter: Admit: 2020-12-25 | Discharge: 2020-12-25 | Payer: MEDICARE

## 2020-12-25 DIAGNOSIS — M2550 Pain in unspecified joint: Secondary | ICD-10-CM

## 2020-12-25 DIAGNOSIS — C801 Malignant (primary) neoplasm, unspecified: Secondary | ICD-10-CM

## 2020-12-25 DIAGNOSIS — M5030 Other cervical disc degeneration, unspecified cervical region: Secondary | ICD-10-CM

## 2020-12-25 DIAGNOSIS — E079 Disorder of thyroid, unspecified: Secondary | ICD-10-CM

## 2020-12-25 DIAGNOSIS — R011 Cardiac murmur, unspecified: Secondary | ICD-10-CM

## 2020-12-25 DIAGNOSIS — M5134 Other intervertebral disc degeneration, thoracic region: Secondary | ICD-10-CM

## 2020-12-25 DIAGNOSIS — F419 Anxiety disorder, unspecified: Secondary | ICD-10-CM

## 2020-12-25 DIAGNOSIS — M5136 Other intervertebral disc degeneration, lumbar region: Secondary | ICD-10-CM

## 2020-12-25 DIAGNOSIS — B999 Unspecified infectious disease: Secondary | ICD-10-CM

## 2020-12-25 DIAGNOSIS — F32A Depression: Secondary | ICD-10-CM

## 2020-12-25 DIAGNOSIS — IMO0002 Ulcer: Secondary | ICD-10-CM

## 2020-12-25 DIAGNOSIS — R519 Generalized headaches: Secondary | ICD-10-CM

## 2020-12-25 DIAGNOSIS — E039 Hypothyroidism, unspecified: Secondary | ICD-10-CM

## 2020-12-25 DIAGNOSIS — M4800 Spinal stenosis, site unspecified: Secondary | ICD-10-CM

## 2020-12-25 DIAGNOSIS — D539 Nutritional anemia, unspecified: Secondary | ICD-10-CM

## 2020-12-25 DIAGNOSIS — I10 Essential (primary) hypertension: Secondary | ICD-10-CM

## 2020-12-25 DIAGNOSIS — C73 Malignant neoplasm of thyroid gland: Secondary | ICD-10-CM

## 2020-12-25 DIAGNOSIS — M549 Dorsalgia, unspecified: Secondary | ICD-10-CM

## 2020-12-26 ENCOUNTER — Encounter: Admit: 2020-12-26 | Discharge: 2020-12-26 | Payer: MEDICARE

## 2020-12-26 ENCOUNTER — Ambulatory Visit: Admit: 2020-12-26 | Discharge: 2020-12-26 | Payer: MEDICARE

## 2020-12-26 DIAGNOSIS — C50912 Malignant neoplasm of unspecified site of left female breast: Secondary | ICD-10-CM

## 2020-12-26 DIAGNOSIS — Z01818 Encounter for other preprocedural examination: Secondary | ICD-10-CM

## 2020-12-26 DIAGNOSIS — S30860A Insect bite (nonvenomous) of lower back and pelvis, initial encounter: Secondary | ICD-10-CM

## 2020-12-26 DIAGNOSIS — C50112 Malignant neoplasm of central portion of left female breast: Secondary | ICD-10-CM

## 2020-12-26 DIAGNOSIS — Z170 Estrogen receptor positive status [ER+]: Secondary | ICD-10-CM

## 2020-12-26 LAB — IMMUNOGLOBULINS-IGA,IGG,IGM
IGA: 286 mg/dL (ref 70–390)
IGG: 825 mg/dL (ref 762–1488)
IGM: 118 mg/dL (ref 38–328)

## 2020-12-26 MED ORDER — GADOBENATE DIMEGLUMINE 529 MG/ML (0.1MMOL/0.2ML) IV SOLN
14 mL | Freq: Once | INTRAVENOUS | 0 refills | Status: CP
Start: 2020-12-26 — End: ?
  Administered 2020-12-26: 15:00:00 14 mL via INTRAVENOUS

## 2020-12-26 MED ORDER — SODIUM CHLORIDE 0.9 % IJ SOLN
50 mL | Freq: Once | INTRAVENOUS | 0 refills | Status: AC
Start: 2020-12-26 — End: ?

## 2020-12-29 ENCOUNTER — Encounter: Admit: 2020-12-29 | Discharge: 2020-12-29 | Payer: MEDICARE

## 2020-12-29 NOTE — Telephone Encounter
I spoke to the patient and her husband this afternoon re: MRI results   We reviewed the MRi results    MRI LEFT -  multiple faint masses/NME extending in upper and inner quadrant - 6.4cm extent; extends to base of nipple; small level I nodes    Given the possible extent of disease and extension to the nipple, she would prefer to proceed with left mastectomy; we will proceed with left SNBx at the time of surgery unless Dr Marylou Mccoy feels that the information would alter treatment beforehand.     She is interested in reconstruction and we will refer to PRS.     The role of contralateral prophylactic mastectomy was discussed with her. She was informed that contralateral mastectomy will not improve her disease free or overall survival from the current breast cancer. In the absence of an actionable genetic mutation, contralateral mastectomy would not change the treatment of her current cancer nor would it improve her survival.  In the absence of an actionable mutation, her estimated risk of contralateral breast cancer is proximately 0.5% - 1% per year.  Contralateral mastectomy would reduce the risk of a second primary breast cancer in that breast.  I would, however, also double her risk for a surgical complication such as skin necrosis, infection, need for additional procedures.     She would be interested in contralateral symmetry procedure at the time of left mastectomy and will discuss this with the plastic surgeon. The right breast was normal on MRI.

## 2020-12-30 ENCOUNTER — Encounter: Admit: 2020-12-30 | Discharge: 2020-12-30 | Payer: MEDICARE

## 2020-12-30 DIAGNOSIS — C50112 Malignant neoplasm of central portion of left female breast: Secondary | ICD-10-CM

## 2020-12-30 DIAGNOSIS — C50912 Malignant neoplasm of unspecified site of left female breast: Secondary | ICD-10-CM

## 2020-12-30 MED ORDER — CEFAZOLIN INJ 1GM IVP
2 g | Freq: Once | INTRAVENOUS | 0 refills
Start: 2020-12-30 — End: ?

## 2020-12-31 ENCOUNTER — Encounter: Admit: 2020-12-31 | Discharge: 2020-12-31 | Payer: MEDICARE

## 2020-12-31 DIAGNOSIS — C50112 Malignant neoplasm of central portion of left female breast: Secondary | ICD-10-CM

## 2020-12-31 DIAGNOSIS — S83241A Other tear of medial meniscus, current injury, right knee, initial encounter: Secondary | ICD-10-CM

## 2021-01-01 ENCOUNTER — Encounter: Admit: 2021-01-01 | Discharge: 2021-01-01 | Payer: MEDICARE

## 2021-01-02 ENCOUNTER — Encounter: Admit: 2021-01-02 | Discharge: 2021-01-02 | Payer: MEDICARE

## 2021-01-02 ENCOUNTER — Ambulatory Visit: Admit: 2021-01-02 | Discharge: 2021-01-02 | Payer: MEDICARE

## 2021-01-02 DIAGNOSIS — M5136 Other intervertebral disc degeneration, lumbar region: Secondary | ICD-10-CM

## 2021-01-02 DIAGNOSIS — B999 Unspecified infectious disease: Secondary | ICD-10-CM

## 2021-01-02 DIAGNOSIS — S83251A Bucket-handle tear of lateral meniscus, current injury, right knee, initial encounter: Secondary | ICD-10-CM

## 2021-01-02 DIAGNOSIS — I10 Essential (primary) hypertension: Secondary | ICD-10-CM

## 2021-01-02 DIAGNOSIS — M5134 Other intervertebral disc degeneration, thoracic region: Secondary | ICD-10-CM

## 2021-01-02 DIAGNOSIS — C801 Malignant (primary) neoplasm, unspecified: Secondary | ICD-10-CM

## 2021-01-02 DIAGNOSIS — M549 Dorsalgia, unspecified: Secondary | ICD-10-CM

## 2021-01-02 DIAGNOSIS — M25561 Pain in right knee: Secondary | ICD-10-CM

## 2021-01-02 DIAGNOSIS — R011 Cardiac murmur, unspecified: Secondary | ICD-10-CM

## 2021-01-02 DIAGNOSIS — M2550 Pain in unspecified joint: Secondary | ICD-10-CM

## 2021-01-02 DIAGNOSIS — E079 Disorder of thyroid, unspecified: Secondary | ICD-10-CM

## 2021-01-02 DIAGNOSIS — C73 Malignant neoplasm of thyroid gland: Secondary | ICD-10-CM

## 2021-01-02 DIAGNOSIS — F32A Depression: Secondary | ICD-10-CM

## 2021-01-02 DIAGNOSIS — M5030 Other cervical disc degeneration, unspecified cervical region: Secondary | ICD-10-CM

## 2021-01-02 DIAGNOSIS — R519 Generalized headaches: Secondary | ICD-10-CM

## 2021-01-02 DIAGNOSIS — E039 Hypothyroidism, unspecified: Secondary | ICD-10-CM

## 2021-01-02 DIAGNOSIS — F419 Anxiety disorder, unspecified: Secondary | ICD-10-CM

## 2021-01-02 DIAGNOSIS — M4800 Spinal stenosis, site unspecified: Secondary | ICD-10-CM

## 2021-01-02 DIAGNOSIS — IMO0002 Ulcer: Secondary | ICD-10-CM

## 2021-01-02 DIAGNOSIS — D539 Nutritional anemia, unspecified: Secondary | ICD-10-CM

## 2021-01-02 NOTE — Patient Instructions
Our plan -     Ace wrap for compression, swelling reduction, and stability   Non -Weightbearing  with crutches and activity as tolerated using pain as your guide   Ice frequently to painful areas, especially after activity. "10x/day for 10 minutes better than 1x/day for 100 minutes"   Diclofenac pills as prescribed   If you have severe/intolerable pain, fever, weakness, change in bowel/bladder control, or swelling/redness please seek emergency room care for prompt evaluation and treatment of your symptoms         Thank you for choosing [facility]!   Please reach out with any questions via MyChart message or calling our Same Day Clinic nurse line at [phone]  To schedule an appointment in our Ortho/Sports Med department please call [phone]

## 2021-01-02 NOTE — Progress Notes
Date of Service: 01/02/2021     Subjective:          Right knee pain       History of Present Illness    Cindy Randolph is a 60 y.o. female who presents to clinic today with her husband for right knee bucket-handle tear evaluation.  She states that 1 week ago she was stretching in bed and instantly felt significant pain to her knee and was unable to straighten the leg.  She then went via ambulance to [facility] where they manipulated the knee in order to straighten out the meniscus.  She was then still in significant amount of pain despite having IV fentanyl she was to be placed in a knee immobilizer.  She then followed up with Dr. Aundria Rud at [facility] who ordered an MRI that showed a bucket-handle tear.  She feels her pain is now controlled however she was recently diagnosed with breast cancer and is having a mastectomy on September 1.  She currently has a an appointment scheduled with one of our surgeons for August 31 and feels this is not soon enough due to both surgeries.  She would like expedited care.  She would not like her to return to [facility] as she is having her cancer care done here at [facility].       Review of Systems   Musculoskeletal: Positive for arthralgias and joint swelling.   All other systems reviewed and are negative.        Objective:         ? ALPRAZolam (XANAX) 0.25 mg tablet Take 0.25 mg by mouth at bedtime as needed for Anxiety.   ? CANNABIDIOL (CBD) 25 MG OIL CAPSULE (COMPOUND) Take 25 mg by mouth twice daily.   ? diclofenac sodium DR (VOLTAREN) 50 mg tablet Take 50 mg by mouth twice daily.   ? Lactobacillus acidophilus (PROBIOTIC) 10 billion cell cap Take  by mouth daily.   ? levothyroxine (SYNTHROID) 100 mcg tablet Take 100 mcg by mouth daily.   ? lisinopriL (ZESTRIL) 2.5 mg tablet Take 2.5 mg by mouth twice daily.   ? omega 3-dha-epa-fish oil (FISH OIL) 100-160-1,000 mg cap Take  by mouth three times weekly.   ? traMADoL (ULTRAM) 50 mg tablet Take 50 mg by mouth as Needed.   ? vitamins, B complex tab Take 1 tablet by mouth daily.   ? vitamins, multiple cap Take 1 capsule by mouth daily.     Vitals:    01/02/21 1553   BP: 115/79   Pulse: 82   Temp: 37 ?C (98.6 ?F)   SpO2: 96%   PainSc: Five   Weight: 68.5 kg (151 lb)   Height: 160 cm (5' 3)     Body mass index is 26.75 kg/m?Marland Kitchen     Physical Exam  Constitutional:       General: She is not in acute distress.  HENT:      Head: Normocephalic and atraumatic.   Eyes:      Pupils: Pupils are equal, round, and reactive to light.   Pulmonary:      Effort: Pulmonary effort is normal.   Musculoskeletal:      Cervical back: Normal range of motion and neck supple.   Skin:     General: Skin is warm and dry.   Neurological:      Mental Status: She is alert and oriented to person, place, and time.   Psychiatric:  Mood and Affect: Mood normal.         Thought Content: Thought content normal.         Judgment: Judgment normal.       Right Knee Exam     Tenderness   The patient is experiencing tenderness in the lateral joint line and medial hamstring.    Range of Motion   Extension: 5   Flexion: 80     Other   Erythema: absent  Scars: absent  Sensation: normal  Pulse: present  Swelling: mild    Comments:  NV intact distally   No erythema, warmth, or swelling   Difficult exam due to pain and guarding with flexion and extension             Imaging:    Impression (01/02/2021  , radiologist)  MRI right lower extremity joint without contrast  1.  Large bucket-handle tear of the lateral meniscus flipped anteriorly and medially into the intercondylar notch and posterior to the anterior root  2.  Patellofemoral and medial compartment cartilage loss  3.  Large joint effusion    X-ray knee complete right  1.  No acute osseous abnormality noted in the right knee    Assessment:  1. Bucket-handle tear of lateral meniscus of right knee as current injury, initial encounter     Plan:    60 y.o.  female with signs and symptoms consistent with bucket-handle tear of the lateral meniscus of the right knee.  Reviewed her x-ray images and MRI from [facility] in clinic today.  Recommended    ? Ace wrap for compression, swelling reduction, and stability   ? Non -Weightbearing with crutches and activity as tolerated using pain as your guide   ? Ice frequently to painful areas, especially after activity. 10x/day for 10 minutes better than 1x/day for 100 minutes   ? Diclofenac pills as prescribed       Would like patient to follow up with sports medicine surgical specialist for further evaluation and management.  We were able to secure her an earlier appointment time this upcoming Monday 01/05/2021 for surgical consult. Risks/benefits of all imaging, pharmacologic and interventional treatments discussed.  She was agreeable to plan and all questions and concerns addressed. Return to or call with any new or worsening symptoms.             Total Time Today was 30 minutes in the following activities: Preparing to see the patient, Obtaining and/or reviewing separately obtained history, Performing a medically appropriate examination and/or evaluation, Counseling and educating the patient/family/caregiver, Ordering medications, tests, or procedures, Referring and communication with other health care professionals (when not separately reported), Documenting clinical information in the electronic or other health record, Independently interpreting results (not separately reported) and communicating results to the patient/family/caregiver and Care coordination (not separately reported)

## 2021-01-03 ENCOUNTER — Encounter: Admit: 2021-01-03 | Discharge: 2021-01-03 | Payer: MEDICARE

## 2021-01-05 ENCOUNTER — Encounter: Admit: 2021-01-05 | Discharge: 2021-01-05 | Payer: MEDICARE

## 2021-01-05 ENCOUNTER — Ambulatory Visit: Admit: 2021-01-05 | Discharge: 2021-01-05 | Payer: MEDICARE

## 2021-01-05 DIAGNOSIS — D539 Nutritional anemia, unspecified: Secondary | ICD-10-CM

## 2021-01-05 DIAGNOSIS — R519 Generalized headaches: Secondary | ICD-10-CM

## 2021-01-05 DIAGNOSIS — M2550 Pain in unspecified joint: Secondary | ICD-10-CM

## 2021-01-05 DIAGNOSIS — E039 Hypothyroidism, unspecified: Secondary | ICD-10-CM

## 2021-01-05 DIAGNOSIS — M5134 Other intervertebral disc degeneration, thoracic region: Secondary | ICD-10-CM

## 2021-01-05 DIAGNOSIS — F32A Depression: Secondary | ICD-10-CM

## 2021-01-05 DIAGNOSIS — R69 Illness, unspecified: Secondary | ICD-10-CM

## 2021-01-05 DIAGNOSIS — M25561 Pain in right knee: Secondary | ICD-10-CM

## 2021-01-05 DIAGNOSIS — IMO0002 Ulcer: Secondary | ICD-10-CM

## 2021-01-05 DIAGNOSIS — M5136 Other intervertebral disc degeneration, lumbar region: Secondary | ICD-10-CM

## 2021-01-05 DIAGNOSIS — M4800 Spinal stenosis, site unspecified: Secondary | ICD-10-CM

## 2021-01-05 DIAGNOSIS — Z719 Counseling, unspecified: Secondary | ICD-10-CM

## 2021-01-05 DIAGNOSIS — F419 Anxiety disorder, unspecified: Secondary | ICD-10-CM

## 2021-01-05 DIAGNOSIS — B999 Unspecified infectious disease: Secondary | ICD-10-CM

## 2021-01-05 DIAGNOSIS — M5030 Other cervical disc degeneration, unspecified cervical region: Secondary | ICD-10-CM

## 2021-01-05 DIAGNOSIS — R011 Cardiac murmur, unspecified: Secondary | ICD-10-CM

## 2021-01-05 DIAGNOSIS — M549 Dorsalgia, unspecified: Secondary | ICD-10-CM

## 2021-01-05 DIAGNOSIS — I10 Essential (primary) hypertension: Secondary | ICD-10-CM

## 2021-01-05 DIAGNOSIS — E079 Disorder of thyroid, unspecified: Secondary | ICD-10-CM

## 2021-01-05 DIAGNOSIS — C73 Malignant neoplasm of thyroid gland: Secondary | ICD-10-CM

## 2021-01-05 DIAGNOSIS — C801 Malignant (primary) neoplasm, unspecified: Secondary | ICD-10-CM

## 2021-01-05 NOTE — Patient Instructions
It was our pleasure to see you today.  If you need anything further contact our office at [phone]

## 2021-01-06 ENCOUNTER — Encounter: Admit: 2021-01-06 | Discharge: 2021-01-06 | Payer: MEDICARE

## 2021-01-06 ENCOUNTER — Ambulatory Visit: Admit: 2021-01-06 | Discharge: 2021-01-06 | Payer: MEDICARE

## 2021-01-06 DIAGNOSIS — S83251D Bucket-handle tear of lateral meniscus, current injury, right knee, subsequent encounter: Secondary | ICD-10-CM

## 2021-01-06 MED ORDER — CEFAZOLIN INJ 1GM IVP
2 g | Freq: Once | INTRAVENOUS | 0 refills
Start: 2021-01-06 — End: ?

## 2021-01-08 ENCOUNTER — Encounter: Admit: 2021-01-08 | Discharge: 2021-01-08 | Payer: MEDICARE

## 2021-01-08 ENCOUNTER — Ambulatory Visit: Admit: 2021-01-08 | Discharge: 2021-01-08 | Payer: MEDICARE

## 2021-01-08 DIAGNOSIS — C73 Malignant neoplasm of thyroid gland: Secondary | ICD-10-CM

## 2021-01-08 DIAGNOSIS — R011 Cardiac murmur, unspecified: Secondary | ICD-10-CM

## 2021-01-08 DIAGNOSIS — M5136 Other intervertebral disc degeneration, lumbar region: Secondary | ICD-10-CM

## 2021-01-08 DIAGNOSIS — IMO0002 Ulcer: Secondary | ICD-10-CM

## 2021-01-08 DIAGNOSIS — R519 Generalized headaches: Secondary | ICD-10-CM

## 2021-01-08 DIAGNOSIS — I10 Essential (primary) hypertension: Secondary | ICD-10-CM

## 2021-01-08 DIAGNOSIS — M4800 Spinal stenosis, site unspecified: Secondary | ICD-10-CM

## 2021-01-08 DIAGNOSIS — D539 Nutritional anemia, unspecified: Secondary | ICD-10-CM

## 2021-01-08 DIAGNOSIS — M2550 Pain in unspecified joint: Secondary | ICD-10-CM

## 2021-01-08 DIAGNOSIS — M5030 Other cervical disc degeneration, unspecified cervical region: Secondary | ICD-10-CM

## 2021-01-08 DIAGNOSIS — E079 Disorder of thyroid, unspecified: Secondary | ICD-10-CM

## 2021-01-08 DIAGNOSIS — F32A Depression: Secondary | ICD-10-CM

## 2021-01-08 DIAGNOSIS — C801 Malignant (primary) neoplasm, unspecified: Secondary | ICD-10-CM

## 2021-01-08 DIAGNOSIS — M549 Dorsalgia, unspecified: Secondary | ICD-10-CM

## 2021-01-08 DIAGNOSIS — E039 Hypothyroidism, unspecified: Secondary | ICD-10-CM

## 2021-01-08 DIAGNOSIS — F419 Anxiety disorder, unspecified: Secondary | ICD-10-CM

## 2021-01-08 DIAGNOSIS — M5134 Other intervertebral disc degeneration, thoracic region: Secondary | ICD-10-CM

## 2021-01-08 DIAGNOSIS — B999 Unspecified infectious disease: Secondary | ICD-10-CM

## 2021-01-08 MED ORDER — PROPOFOL INJ 10 MG/ML IV VIAL
INTRAVENOUS | 0 refills | Status: DC
Start: 2021-01-08 — End: 2021-01-08
  Administered 2021-01-08: 19:00:00 150 mg via INTRAVENOUS

## 2021-01-08 MED ORDER — LIDOCAINE (PF) 200 MG/10 ML (2 %) IJ SYRG
INTRAVENOUS | 0 refills | Status: DC
Start: 2021-01-08 — End: 2021-01-08
  Administered 2021-01-08: 19:00:00 100 mg via INTRAVENOUS

## 2021-01-08 MED ORDER — MIDAZOLAM 1 MG/ML IJ SOLN
INTRAVENOUS | 0 refills | Status: DC
Start: 2021-01-08 — End: 2021-01-08
  Administered 2021-01-08: 19:00:00 2 mg via INTRAVENOUS

## 2021-01-08 MED ORDER — ONDANSETRON HCL (PF) 4 MG/2 ML IJ SOLN
INTRAVENOUS | 0 refills | Status: DC
Start: 2021-01-08 — End: 2021-01-08
  Administered 2021-01-08: 20:00:00 4 mg via INTRAVENOUS

## 2021-01-08 MED ORDER — ESMOLOL 100 MG/10 ML (10 MG/ML) IV SOLN
INTRAVENOUS | 0 refills | Status: DC
Start: 2021-01-08 — End: 2021-01-08
  Administered 2021-01-08: 20:00:00 50 mg via INTRAVENOUS

## 2021-01-08 MED ORDER — DEXAMETHASONE SODIUM PHOSPHATE 4 MG/ML IJ SOLN
INTRAVENOUS | 0 refills | Status: DC
Start: 2021-01-08 — End: 2021-01-08
  Administered 2021-01-08: 19:00:00 4 mg via INTRAVENOUS

## 2021-01-08 MED ORDER — FENTANYL CITRATE (PF) 50 MCG/ML IJ SOLN
INTRAVENOUS | 0 refills | Status: DC
Start: 2021-01-08 — End: 2021-01-08
  Administered 2021-01-08 (×2): 50 ug via INTRAVENOUS

## 2021-01-08 MED ORDER — EPHEDRINE SULFATE 50 MG/ML IV SOLN
INTRAVENOUS | 0 refills | Status: DC
Start: 2021-01-08 — End: 2021-01-08
  Administered 2021-01-08: 20:00:00 25 mg via INTRAVENOUS

## 2021-01-08 MED ORDER — ARTIFICIAL TEARS (PF) SINGLE DOSE DROPS GROUP
OPHTHALMIC | 0 refills | Status: DC
Start: 2021-01-08 — End: 2021-01-08
  Administered 2021-01-08: 19:00:00 2 [drp] via OPHTHALMIC

## 2021-01-08 MED ADMIN — FENTANYL CITRATE (PF) 50 MCG/ML IJ SOLN [3037]: 25 ug | INTRAVENOUS | @ 21:00:00 | Stop: 2021-01-09 | NDC 00641602701

## 2021-01-08 MED ADMIN — ACETAMINOPHEN 500 MG PO TAB [102]: 1000 mg | ORAL | @ 16:00:00 | Stop: 2021-01-08 | NDC 00904673061

## 2021-01-08 MED ADMIN — LACTATED RINGERS IV SOLP [4318]: 1000.000 mL | INTRAVENOUS | @ 16:00:00 | Stop: 2021-01-09 | NDC 00990795309

## 2021-01-08 MED ADMIN — SCOPOLAMINE BASE 1 MG OVER 3 DAYS TD PT3D [82141]: 1 | TRANSDERMAL | @ 16:00:00 | Stop: 2021-01-09 | NDC 50742050501

## 2021-01-08 MED ADMIN — ROPIVACAINE (PF) 5 MG/ML (0.5 %) IJ SOLN [308365]: 4 mL | @ 20:00:00 | Stop: 2021-01-08 | NDC 63323028695

## 2021-01-08 MED ADMIN — OXYCODONE 5 MG PO TAB [10814]: 5 mg | ORAL | @ 21:00:00 | Stop: 2021-01-08 | NDC 00904696661

## 2021-01-08 MED ADMIN — TRIAMCINOLONE ACETONIDE 40 MG/ML IJ SUSP [80370]: 40 mg | INTRAMUSCULAR | @ 20:00:00 | Stop: 2021-01-08 | NDC 00003029305

## 2021-01-08 MED ADMIN — CEFAZOLIN INJ 1GM IVP [210319]: 2 g | INTRAVENOUS | @ 19:00:00 | Stop: 2021-01-08 | NDC 44567070725

## 2021-01-08 MED FILL — HYDROCODONE-ACETAMINOPHEN 5-325 MG PO TAB: 5/325 mg | ORAL | 2 days supply | Qty: 10 | Fill #1 | Status: CP

## 2021-01-08 MED FILL — ONDANSETRON HCL 4 MG PO TAB: 4 mg | ORAL | 4 days supply | Qty: 10 | Fill #1 | Status: CP

## 2021-01-08 NOTE — Anesthesia Post-Procedure Evaluation
Post-Anesthesia Evaluation    Name: Cindy Randolph      MRN: [id number]     DOB: [birth year]     Age: 60 y.o.     Sex: female   __________________________________________________________________________     Procedure Information     Anesthesia Start Date/Time: 01/08/21 1413    Procedure: ARTHROSCOPY KNEE WITH PARTIAL LATERAL MENISCECTOMY AND LEFT KNEE INJECTION. (Right Knee) - 30 mins.**    Location: [facility] OR 8 / [place] MAIN OR/PERIOP    Surgeons: Ezekiel Ina, MD          Post-Anesthesia Vitals  BP: 139/96 (08/18 1658)  Temp: 36.4 C (97.6 F) (08/18 1658)  Pulse: 93 (08/18 1658)  Respirations: 12 PER MINUTE (08/18 1658)  SpO2: 95 % (08/18 1658)  SpO2 Pulse: 92 (08/18 1658)  O2 Device: None (Room air) (08/18 1658)  Height: 160 cm ('5\' 3"'$ ) (08/18 1018)   Vitals Value Taken Time   BP 139/96 01/08/21 1658   Temp 36.4 C (97.6 F) 01/08/21 1658   Pulse 93 01/08/21 1658   Respirations 12 PER MINUTE 01/08/21 1658   SpO2 95 % 01/08/21 1658   O2 Device None (Room air) 01/08/21 1658   ABP     ART BP           Post Anesthesia Evaluation Note    Evaluation location: pre/post  Patient participation: recovered; patient participated in evaluation  Level of consciousness: alert    Pain score: 3  Pain management: adequate    Hydration: normovolemia  Temperature: 36.0C - 38.4C  Airway patency: adequate    Perioperative Events       Post-op nausea and vomiting: no PONV    Postoperative Status  Cardiovascular status: hemodynamically stable  Respiratory status: spontaneous ventilation  Follow-up needed: none        Perioperative Events  There were no known complications for this encounter.

## 2021-01-08 NOTE — Anesthesia Pre-Procedure Evaluation
Anesthesia Pre-Procedure Evaluation    Name: Cindy Randolph      MRN: [id number]     DOB: 06/01/1960     Age: 60 y.o.     Sex: female   _________________________________________________________________________     Procedure Info:   Procedure Information     Date/Time: 01/08/21 1245    Procedure: ARTHROSCOPY KNEE WITH LATERAL MENISCECTOMY (Right ) - 30 mins.**    Location: [facility] OR [facility] / [facility] MA[facility] OR/PERIOP    Surgeons: Tanja Port, MD          Physical Assessment  Vital Signs (last filed in past 24 hours):  BP: 115/90 (08/18 1018)  Temp: 37.1 ?C (98.8 ?F) (08/18 1018)  Pulse: 70 (08/18 1018)  Respirations: 16 PER MINUTE (08/18 1018)  SpO2: 98 % (08/18 1018)  O2 Device: None (Room air) (08/18 1018)  Height: 160 cm (5' 3) (08/18 1018)  Weight: 66.7 kg (147 lb) (08/18 1018)      Patient History   Allergies   Allergen Reactions   ? Sudafed [Pseudoephedrine Hcl] PALPITATIONS        Current Medications    Medication Directions   ALPRAZolam (XANAX) 0.25 mg tablet Take 0.25 mg by mouth at bedtime as needed for Anxiety.   aspirin EC 81 mg tablet Take one tablet by mouth daily for 42 days. Take with food.   docusate (COLACE) 100 mg capsule Take one capsule by mouth twice daily for 30 days.   HYDROcodone/acetaminophen (NORCO) 5/325 mg tablet Take one tablet by mouth every 4 hours as needed for Pain for up to 20 doses.   Lactobacillus acidophilus (PROBIOTIC) 10 billion cell cap Take  by mouth daily.   levothyroxine (SYNTHROID) 100 mcg tablet Take 100 mcg by mouth daily.   lisinopriL (ZESTRIL) 2.5 mg tablet Take 2.5 mg by mouth twice daily.   omega 3-dha-epa-fish oil (FISH OIL) 100-160-1,000 mg cap Take  by mouth three times weekly.   ondansetron HCL (ZOFRAN) 4 mg tablet Take one tablet by mouth every 8 hours as needed for Nausea or Vomiting for up to 10 days.   vitamins, B complex tab Take 1 tablet by mouth daily.   vitamins, multiple cap Take 1 capsule by mouth daily.         Review of Systems/Medical History      Patient summary reviewed  Nursing notes reviewed  Pertinent labs reviewed    PONV Screening: Female gender, Non-smoker and Postoperative opioids  No history of anesthetic complications  No family history of anesthetic complications      Airway - negative        Pulmonary - negative      Not a current smoker        No indications/hx of asthma    no COPD      No sleep apnea      Cardiovascular         Exercise tolerance: >4 METS      Beta Blocker therapy: No      Beta blockers within 24 hours: n/a        Hypertension, well controlled      No valvular problems/murmurs      No past MI,       No hx of coronary artery disease      No PTCA      No dysrhythmias      No angina      No dyspnea on exertion      GI/Hepatic/Renal  No GERD,       Peptic ulcer disease      No hx of liver disease     No renal disease      Neuro/Psych       No seizures      No CVA        Psychiatric history          Depression          Anxiety      Musculoskeletal         No neck pain      Back pain      Arthritis      Endocrine/Other       No diabetes      Hypothyroidism      No anemia      Malignancy (h/o thyroid and breast cancer)    Constitution - negative   Physical Exam    Airway Findings      Mallampati: II      TM distance: >3 FB      Neck ROM: full      Mouth opening: good      Airway patency: adequate    Dental Findings: Negative      Cardiovascular Findings: Negative      Rhythm: regular      Rate: normal      No murmur    Pulmonary Findings: Negative      Breath sounds clear to auscultation.    Abdominal Findings: Negative      Neurological Findings: Negative      Alert and oriented x 3    Constitutional findings: Negative      No acute distress      Well-developed      Well-nourished       Diagnostic Tests  Hematology:   Lab Results   Component Value Date    HGB 12.9 12/26/2020    HCT 38.6 12/26/2020    PLTCT 165 12/26/2020    WBC 6.5 12/26/2020    NEUT 68 12/26/2020    ANC 4.49 12/26/2020    ALC 1.36 12/26/2020    MONA 9 12/26/2020 AMC 0.58 12/26/2020    EOSA 1 12/26/2020    ABC 0.04 12/26/2020    MCV 91.2 12/26/2020    MCH 30.5 12/26/2020    MCHC 33.5 12/26/2020    MPV 9.8 12/26/2020    RDW 13.8 12/26/2020         General Chemistry:   Lab Results   Component Value Date    NA 129 12/26/2020    K 4.0 12/26/2020    CL 94 12/26/2020    CO2 26 12/26/2020    GAP 9 12/26/2020    BUN 16 12/26/2020    CR 0.84 12/26/2020    GLU 94 12/26/2020    CA 9.0 12/26/2020    ALBUMIN 4.4 12/26/2020    TOTBILI 0.6 12/26/2020      Coagulation: No results found for: PT, PTT, INR      Anesthesia Plan    ASA score: 2   Plan: general  Induction method: intravenous  NPO status: acceptable      Informed Consent  Anesthetic plan and risks discussed with patient.        Plan discussed with: anesthesiologist, CRNA and surgeon/proceduralist.  Comments: (I have discussed the risks/benefits/possible complications of general anesthesia with the patient.  These have included dental/oral injury/tooth loss during airway manipulation, nausea/vomiting, delayed/prolonged emergence, and possible cardiac or pulmonary complications.  The patient expressed understanding and wishes to proceed with a general anesthetic.)

## 2021-01-09 ENCOUNTER — Encounter: Admit: 2021-01-09 | Discharge: 2021-01-09 | Payer: MEDICARE

## 2021-01-09 DIAGNOSIS — Z9889 Other specified postprocedural states: Secondary | ICD-10-CM

## 2021-01-10 ENCOUNTER — Encounter: Admit: 2021-01-10 | Discharge: 2021-01-10 | Payer: MEDICARE

## 2021-01-10 DIAGNOSIS — IMO0002 Ulcer: Secondary | ICD-10-CM

## 2021-01-10 DIAGNOSIS — M4800 Spinal stenosis, site unspecified: Secondary | ICD-10-CM

## 2021-01-10 DIAGNOSIS — I10 Essential (primary) hypertension: Secondary | ICD-10-CM

## 2021-01-10 DIAGNOSIS — M2550 Pain in unspecified joint: Secondary | ICD-10-CM

## 2021-01-10 DIAGNOSIS — M5136 Other intervertebral disc degeneration, lumbar region: Secondary | ICD-10-CM

## 2021-01-10 DIAGNOSIS — R011 Cardiac murmur, unspecified: Secondary | ICD-10-CM

## 2021-01-10 DIAGNOSIS — E079 Disorder of thyroid, unspecified: Secondary | ICD-10-CM

## 2021-01-10 DIAGNOSIS — M5030 Other cervical disc degeneration, unspecified cervical region: Secondary | ICD-10-CM

## 2021-01-10 DIAGNOSIS — M549 Dorsalgia, unspecified: Secondary | ICD-10-CM

## 2021-01-10 DIAGNOSIS — E039 Hypothyroidism, unspecified: Secondary | ICD-10-CM

## 2021-01-10 DIAGNOSIS — F419 Anxiety disorder, unspecified: Secondary | ICD-10-CM

## 2021-01-10 DIAGNOSIS — C73 Malignant neoplasm of thyroid gland: Secondary | ICD-10-CM

## 2021-01-10 DIAGNOSIS — R519 Generalized headaches: Secondary | ICD-10-CM

## 2021-01-10 DIAGNOSIS — M5134 Other intervertebral disc degeneration, thoracic region: Secondary | ICD-10-CM

## 2021-01-10 DIAGNOSIS — B999 Unspecified infectious disease: Secondary | ICD-10-CM

## 2021-01-10 DIAGNOSIS — C801 Malignant (primary) neoplasm, unspecified: Secondary | ICD-10-CM

## 2021-01-10 DIAGNOSIS — F32A Depression: Secondary | ICD-10-CM

## 2021-01-10 DIAGNOSIS — D539 Nutritional anemia, unspecified: Secondary | ICD-10-CM

## 2021-01-13 ENCOUNTER — Encounter: Admit: 2021-01-13 | Discharge: 2021-01-13 | Payer: MEDICARE

## 2021-01-13 DIAGNOSIS — E039 Hypothyroidism, unspecified: Secondary | ICD-10-CM

## 2021-01-13 DIAGNOSIS — M5134 Other intervertebral disc degeneration, thoracic region: Secondary | ICD-10-CM

## 2021-01-13 DIAGNOSIS — R011 Cardiac murmur, unspecified: Secondary | ICD-10-CM

## 2021-01-13 DIAGNOSIS — C801 Malignant (primary) neoplasm, unspecified: Secondary | ICD-10-CM

## 2021-01-13 DIAGNOSIS — M4800 Spinal stenosis, site unspecified: Secondary | ICD-10-CM

## 2021-01-13 DIAGNOSIS — F419 Anxiety disorder, unspecified: Secondary | ICD-10-CM

## 2021-01-13 DIAGNOSIS — F32A Depression: Secondary | ICD-10-CM

## 2021-01-13 DIAGNOSIS — M2550 Pain in unspecified joint: Secondary | ICD-10-CM

## 2021-01-13 DIAGNOSIS — M5136 Other intervertebral disc degeneration, lumbar region: Secondary | ICD-10-CM

## 2021-01-13 DIAGNOSIS — M5030 Other cervical disc degeneration, unspecified cervical region: Secondary | ICD-10-CM

## 2021-01-13 DIAGNOSIS — B999 Unspecified infectious disease: Secondary | ICD-10-CM

## 2021-01-13 DIAGNOSIS — E079 Disorder of thyroid, unspecified: Secondary | ICD-10-CM

## 2021-01-13 DIAGNOSIS — R519 Generalized headaches: Secondary | ICD-10-CM

## 2021-01-13 DIAGNOSIS — D539 Nutritional anemia, unspecified: Secondary | ICD-10-CM

## 2021-01-13 DIAGNOSIS — C73 Malignant neoplasm of thyroid gland: Secondary | ICD-10-CM

## 2021-01-13 DIAGNOSIS — I10 Essential (primary) hypertension: Secondary | ICD-10-CM

## 2021-01-13 DIAGNOSIS — IMO0002 Ulcer: Secondary | ICD-10-CM

## 2021-01-13 DIAGNOSIS — M549 Dorsalgia, unspecified: Secondary | ICD-10-CM

## 2021-01-14 ENCOUNTER — Encounter: Admit: 2021-01-14 | Discharge: 2021-01-14 | Payer: MEDICARE

## 2021-01-14 ENCOUNTER — Ambulatory Visit: Admit: 2021-01-14 | Discharge: 2021-01-14 | Payer: MEDICARE

## 2021-01-14 DIAGNOSIS — C801 Malignant (primary) neoplasm, unspecified: Secondary | ICD-10-CM

## 2021-01-14 DIAGNOSIS — IMO0002 Ulcer: Secondary | ICD-10-CM

## 2021-01-14 DIAGNOSIS — R011 Cardiac murmur, unspecified: Secondary | ICD-10-CM

## 2021-01-14 DIAGNOSIS — M5136 Other intervertebral disc degeneration, lumbar region: Secondary | ICD-10-CM

## 2021-01-14 DIAGNOSIS — M2550 Pain in unspecified joint: Secondary | ICD-10-CM

## 2021-01-14 DIAGNOSIS — S83209A Unspecified tear of unspecified meniscus, current injury, unspecified knee, initial encounter: Secondary | ICD-10-CM

## 2021-01-14 DIAGNOSIS — F32A Depression: Secondary | ICD-10-CM

## 2021-01-14 DIAGNOSIS — M5134 Other intervertebral disc degeneration, thoracic region: Secondary | ICD-10-CM

## 2021-01-14 DIAGNOSIS — C73 Malignant neoplasm of thyroid gland: Secondary | ICD-10-CM

## 2021-01-14 DIAGNOSIS — M5030 Other cervical disc degeneration, unspecified cervical region: Secondary | ICD-10-CM

## 2021-01-14 DIAGNOSIS — I10 Essential (primary) hypertension: Secondary | ICD-10-CM

## 2021-01-14 DIAGNOSIS — E079 Disorder of thyroid, unspecified: Secondary | ICD-10-CM

## 2021-01-14 DIAGNOSIS — E039 Hypothyroidism, unspecified: Secondary | ICD-10-CM

## 2021-01-14 DIAGNOSIS — M4800 Spinal stenosis, site unspecified: Secondary | ICD-10-CM

## 2021-01-14 DIAGNOSIS — R519 Generalized headaches: Secondary | ICD-10-CM

## 2021-01-14 DIAGNOSIS — D539 Nutritional anemia, unspecified: Secondary | ICD-10-CM

## 2021-01-14 DIAGNOSIS — M549 Dorsalgia, unspecified: Secondary | ICD-10-CM

## 2021-01-14 DIAGNOSIS — B999 Unspecified infectious disease: Secondary | ICD-10-CM

## 2021-01-14 DIAGNOSIS — F419 Anxiety disorder, unspecified: Secondary | ICD-10-CM

## 2021-01-15 ENCOUNTER — Encounter: Admit: 2021-01-15 | Discharge: 2021-01-15 | Payer: MEDICARE

## 2021-01-15 DIAGNOSIS — M549 Dorsalgia, unspecified: Secondary | ICD-10-CM

## 2021-01-15 DIAGNOSIS — I10 Essential (primary) hypertension: Secondary | ICD-10-CM

## 2021-01-15 DIAGNOSIS — M5030 Other cervical disc degeneration, unspecified cervical region: Secondary | ICD-10-CM

## 2021-01-15 DIAGNOSIS — M2550 Pain in unspecified joint: Secondary | ICD-10-CM

## 2021-01-15 DIAGNOSIS — F419 Anxiety disorder, unspecified: Secondary | ICD-10-CM

## 2021-01-15 DIAGNOSIS — S83209A Unspecified tear of unspecified meniscus, current injury, unspecified knee, initial encounter: Secondary | ICD-10-CM

## 2021-01-15 DIAGNOSIS — M5134 Other intervertebral disc degeneration, thoracic region: Secondary | ICD-10-CM

## 2021-01-15 DIAGNOSIS — C801 Malignant (primary) neoplasm, unspecified: Secondary | ICD-10-CM

## 2021-01-15 DIAGNOSIS — M4800 Spinal stenosis, site unspecified: Secondary | ICD-10-CM

## 2021-01-15 DIAGNOSIS — R519 Generalized headaches: Secondary | ICD-10-CM

## 2021-01-15 DIAGNOSIS — E079 Disorder of thyroid, unspecified: Secondary | ICD-10-CM

## 2021-01-15 DIAGNOSIS — B999 Unspecified infectious disease: Secondary | ICD-10-CM

## 2021-01-15 DIAGNOSIS — C73 Malignant neoplasm of thyroid gland: Secondary | ICD-10-CM

## 2021-01-15 DIAGNOSIS — R011 Cardiac murmur, unspecified: Secondary | ICD-10-CM

## 2021-01-15 DIAGNOSIS — M5136 Other intervertebral disc degeneration, lumbar region: Secondary | ICD-10-CM

## 2021-01-15 DIAGNOSIS — IMO0002 Ulcer: Secondary | ICD-10-CM

## 2021-01-15 DIAGNOSIS — F32A Depression: Secondary | ICD-10-CM

## 2021-01-15 DIAGNOSIS — E039 Hypothyroidism, unspecified: Secondary | ICD-10-CM

## 2021-01-15 DIAGNOSIS — D539 Nutritional anemia, unspecified: Secondary | ICD-10-CM

## 2021-01-19 ENCOUNTER — Encounter: Admit: 2021-01-19 | Discharge: 2021-01-19 | Payer: MEDICARE

## 2021-01-19 ENCOUNTER — Ambulatory Visit: Admit: 2021-01-19 | Discharge: 2021-01-19 | Payer: MEDICARE

## 2021-01-19 DIAGNOSIS — E079 Disorder of thyroid, unspecified: Secondary | ICD-10-CM

## 2021-01-19 DIAGNOSIS — M2550 Pain in unspecified joint: Secondary | ICD-10-CM

## 2021-01-19 DIAGNOSIS — M4800 Spinal stenosis, site unspecified: Secondary | ICD-10-CM

## 2021-01-19 DIAGNOSIS — B999 Unspecified infectious disease: Secondary | ICD-10-CM

## 2021-01-19 DIAGNOSIS — D539 Nutritional anemia, unspecified: Secondary | ICD-10-CM

## 2021-01-19 DIAGNOSIS — S83209A Unspecified tear of unspecified meniscus, current injury, unspecified knee, initial encounter: Secondary | ICD-10-CM

## 2021-01-19 DIAGNOSIS — C73 Malignant neoplasm of thyroid gland: Secondary | ICD-10-CM

## 2021-01-19 DIAGNOSIS — IMO0002 Ulcer: Secondary | ICD-10-CM

## 2021-01-19 DIAGNOSIS — M5134 Other intervertebral disc degeneration, thoracic region: Secondary | ICD-10-CM

## 2021-01-19 DIAGNOSIS — R011 Cardiac murmur, unspecified: Secondary | ICD-10-CM

## 2021-01-19 DIAGNOSIS — C801 Malignant (primary) neoplasm, unspecified: Secondary | ICD-10-CM

## 2021-01-19 DIAGNOSIS — F419 Anxiety disorder, unspecified: Secondary | ICD-10-CM

## 2021-01-19 DIAGNOSIS — M549 Dorsalgia, unspecified: Secondary | ICD-10-CM

## 2021-01-19 DIAGNOSIS — M5136 Other intervertebral disc degeneration, lumbar region: Secondary | ICD-10-CM

## 2021-01-19 DIAGNOSIS — F32A Depression: Secondary | ICD-10-CM

## 2021-01-19 DIAGNOSIS — M5030 Other cervical disc degeneration, unspecified cervical region: Secondary | ICD-10-CM

## 2021-01-19 DIAGNOSIS — E039 Hypothyroidism, unspecified: Secondary | ICD-10-CM

## 2021-01-19 DIAGNOSIS — Z9889 Other specified postprocedural states: Secondary | ICD-10-CM

## 2021-01-19 DIAGNOSIS — I10 Essential (primary) hypertension: Secondary | ICD-10-CM

## 2021-01-19 DIAGNOSIS — R519 Generalized headaches: Secondary | ICD-10-CM

## 2021-01-19 NOTE — Patient Instructions
Please do not hesitate to contact my office with any questions.    Dr. Bryan Vopat & Stephanie Caldwell PA-C - Orthopedic Surgeon, Sports Medicine  [facility] - Phone [phone] - Fax [phone]   [address], [address] - [place], [place] [address]    To schedule an appointment, please call our scheduling line at [phone]    Suheyb Raucci BSN, RN - Clinical Nurse Coordinator  Casey Conover BSN, RN - Clinical Nurse Coordinator  Stephanie Adamson MS, ATC, LAT - Clinical Athletic Trainer    Thank you for supporting our practice! Your feedback helps us deliver the highest quality of care.  Review us at: [email or web address]

## 2021-01-21 ENCOUNTER — Encounter: Admit: 2021-01-21 | Discharge: 2021-01-21 | Payer: MEDICARE

## 2021-01-22 ENCOUNTER — Encounter: Admit: 2021-01-22 | Discharge: 2021-01-22 | Payer: MEDICARE

## 2021-01-22 ENCOUNTER — Ambulatory Visit: Admit: 2021-01-22 | Discharge: 2021-01-22 | Payer: MEDICARE

## 2021-01-22 DIAGNOSIS — C50112 Malignant neoplasm of central portion of left female breast: Secondary | ICD-10-CM

## 2021-01-22 DIAGNOSIS — M5134 Other intervertebral disc degeneration, thoracic region: Secondary | ICD-10-CM

## 2021-01-22 DIAGNOSIS — M4800 Spinal stenosis, site unspecified: Secondary | ICD-10-CM

## 2021-01-22 DIAGNOSIS — R519 Generalized headaches: Secondary | ICD-10-CM

## 2021-01-22 DIAGNOSIS — M5136 Other intervertebral disc degeneration, lumbar region: Secondary | ICD-10-CM

## 2021-01-22 DIAGNOSIS — D539 Nutritional anemia, unspecified: Secondary | ICD-10-CM

## 2021-01-22 DIAGNOSIS — F419 Anxiety disorder, unspecified: Secondary | ICD-10-CM

## 2021-01-22 DIAGNOSIS — S83209A Unspecified tear of unspecified meniscus, current injury, unspecified knee, initial encounter: Secondary | ICD-10-CM

## 2021-01-22 DIAGNOSIS — C73 Malignant neoplasm of thyroid gland: Secondary | ICD-10-CM

## 2021-01-22 DIAGNOSIS — IMO0002 Ulcer: Secondary | ICD-10-CM

## 2021-01-22 DIAGNOSIS — M5030 Other cervical disc degeneration, unspecified cervical region: Secondary | ICD-10-CM

## 2021-01-22 DIAGNOSIS — M549 Dorsalgia, unspecified: Secondary | ICD-10-CM

## 2021-01-22 DIAGNOSIS — F32A Depression: Secondary | ICD-10-CM

## 2021-01-22 DIAGNOSIS — B999 Unspecified infectious disease: Secondary | ICD-10-CM

## 2021-01-22 DIAGNOSIS — I10 Essential (primary) hypertension: Secondary | ICD-10-CM

## 2021-01-22 DIAGNOSIS — E079 Disorder of thyroid, unspecified: Secondary | ICD-10-CM

## 2021-01-22 DIAGNOSIS — M2550 Pain in unspecified joint: Secondary | ICD-10-CM

## 2021-01-22 DIAGNOSIS — R011 Cardiac murmur, unspecified: Secondary | ICD-10-CM

## 2021-01-22 DIAGNOSIS — C801 Malignant (primary) neoplasm, unspecified: Secondary | ICD-10-CM

## 2021-01-22 DIAGNOSIS — E039 Hypothyroidism, unspecified: Secondary | ICD-10-CM

## 2021-01-22 MED ORDER — PROPOFOL 10 MG/ML IV EMUL 100 ML (INFUSION)(AM)(OR)
INTRAVENOUS | 0 refills | Status: DC
Start: 2021-01-22 — End: 2021-01-22
  Administered 2021-01-22: 15:00:00 150 ug/kg/min via INTRAVENOUS

## 2021-01-22 MED ORDER — LIDOCAINE (PF) 200 MG/10 ML (2 %) IJ SYRG
INTRAVENOUS | 0 refills | Status: DC
Start: 2021-01-22 — End: 2021-01-22
  Administered 2021-01-22: 15:00:00 100 mg via INTRAVENOUS

## 2021-01-22 MED ORDER — DEXAMETHASONE SODIUM PHOSPHATE 4 MG/ML IJ SOLN
INTRAVENOUS | 0 refills | Status: DC
Start: 2021-01-22 — End: 2021-01-22
  Administered 2021-01-22: 15:00:00 4 mg via INTRAVENOUS

## 2021-01-22 MED ORDER — RP DX TC-99M SULF COLL MCI
.5 | Freq: Once | INTRAVENOUS | 0 refills | Status: CP
Start: 2021-01-22 — End: ?
  Administered 2021-01-22: 13:00:00 0.5 via INTRAVENOUS

## 2021-01-22 MED ORDER — DEXMEDETOMIDINE IN 0.9 % NACL 20 MCG/5 ML (4 MCG/ML) IV SYRG
INTRAVENOUS | 0 refills | Status: DC
Start: 2021-01-22 — End: 2021-01-22
  Administered 2021-01-22 (×5): 4 ug via INTRAVENOUS

## 2021-01-22 MED ORDER — MIDAZOLAM 1 MG/ML IJ SOLN
INTRAVENOUS | 0 refills | Status: DC
Start: 2021-01-22 — End: 2021-01-22
  Administered 2021-01-22: 15:00:00 2 mg via INTRAVENOUS

## 2021-01-22 MED ORDER — ONDANSETRON HCL (PF) 4 MG/2 ML IJ SOLN
INTRAVENOUS | 0 refills | Status: DC
Start: 2021-01-22 — End: 2021-01-22
  Administered 2021-01-22: 17:00:00 4 mg via INTRAVENOUS

## 2021-01-22 MED ORDER — FENTANYL CITRATE (PF) 50 MCG/ML IJ SOLN
INTRAVENOUS | 0 refills | Status: DC
Start: 2021-01-22 — End: 2021-01-22
  Administered 2021-01-22 (×4): 100 ug via INTRAVENOUS

## 2021-01-22 MED ORDER — PROPOFOL INJ 10 MG/ML IV VIAL
INTRAVENOUS | 0 refills | Status: DC
Start: 2021-01-22 — End: 2021-01-22
  Administered 2021-01-22: 15:00:00 200 mg via INTRAVENOUS

## 2021-01-22 MED ADMIN — ACETAMINOPHEN 500 MG PO TAB [102]: 1000 mg | ORAL | @ 20:00:00 | NDC 00904673061

## 2021-01-22 MED ADMIN — DIAZEPAM 5 MG PO TAB [2405]: 5 mg | ORAL | @ 23:00:00 | NDC 51079028501

## 2021-01-22 MED ADMIN — FENTANYL CITRATE (PF) 50 MCG/ML IJ SOLN [3037]: 25 ug | INTRAVENOUS | @ 18:00:00 | Stop: 2021-01-22 | NDC 00641602701

## 2021-01-22 MED ADMIN — DIAZEPAM 5 MG PO TAB [2405]: 5 mg | ORAL | @ 18:00:00 | Stop: 2021-01-22 | NDC 51079028501

## 2021-01-22 MED ADMIN — CEFAZOLIN INJ 1GM IVP [210319]: 2 g | INTRAVENOUS | @ 15:00:00 | Stop: 2021-01-22 | NDC 44567070725

## 2021-01-22 MED ADMIN — ACETAMINOPHEN 500 MG PO TAB [102]: 1000 mg | ORAL | @ 14:00:00 | Stop: 2021-01-22 | NDC 00904673061

## 2021-01-22 MED ADMIN — OXYCODONE 5 MG PO TAB [10814]: 5 mg | ORAL | @ 22:00:00 | Stop: 2021-01-23 | NDC 00904696661

## 2021-01-22 MED ADMIN — LACTATED RINGERS IV SOLP [4318]: 1000 mL | INTRAVENOUS | @ 14:00:00 | Stop: 2021-01-22 | NDC 00338011704

## 2021-01-22 MED ADMIN — SCOPOLAMINE BASE 1 MG OVER 3 DAYS TD PT3D [82141]: 1 | TRANSDERMAL | @ 14:00:00 | Stop: 2021-01-25 | NDC 50742050501

## 2021-01-22 MED ADMIN — OXYCODONE 5 MG PO TAB [10814]: 5 mg | ORAL | @ 20:00:00 | Stop: 2021-01-23 | NDC 00904696661

## 2021-01-22 MED ADMIN — DEXTROSE 5%-0.45% SODIUM CHLORIDE & POTASSIUM CHLORIDE 20 MEQ/L IV SOLP [14863]: 1000.000 mL | INTRAVENOUS | @ 20:00:00 | Stop: 2021-01-24 | NDC 00338067104

## 2021-01-22 MED ADMIN — INDOCYANINE GREEN 25 MG IJ SOLR [82549]: 0.2 mL | INTRAMUSCULAR | @ 15:00:00 | Stop: 2021-01-22 | NDC 17478070102

## 2021-01-23 ENCOUNTER — Encounter: Admit: 2021-01-23 | Discharge: 2021-01-23 | Payer: MEDICARE

## 2021-01-23 MED ADMIN — OXYCODONE 5 MG PO TAB [10814]: 10 mg | ORAL | @ 03:00:00 | NDC 00904696661

## 2021-01-23 MED ADMIN — SENNOSIDES-DOCUSATE SODIUM 8.6-50 MG PO TAB [40926]: 1 | ORAL | @ 02:00:00 | NDC 60687062211

## 2021-01-23 MED ADMIN — ACETAMINOPHEN 500 MG PO TAB [102]: 1000 mg | ORAL | @ 07:00:00 | Stop: 2021-01-23 | NDC 00904673061

## 2021-01-23 MED ADMIN — ACETAMINOPHEN 500 MG PO TAB [102]: 1000 mg | ORAL | @ 14:00:00 | Stop: 2021-01-23 | NDC 00904673061

## 2021-01-23 MED ADMIN — ACETAMINOPHEN 500 MG PO TAB [102]: 1000 mg | ORAL | @ 02:00:00 | NDC 00904673061

## 2021-01-23 MED ADMIN — LEVOTHYROXINE 100 MCG PO TAB [4423]: 100 ug | ORAL | @ 11:00:00 | Stop: 2021-01-23 | NDC 00904695361

## 2021-01-23 MED ADMIN — DIAZEPAM 5 MG PO TAB [2405]: 5 mg | ORAL | @ 15:00:00 | Stop: 2021-01-23 | NDC 51079028501

## 2021-01-23 MED ADMIN — OXYCODONE 5 MG PO TAB [10814]: 10 mg | ORAL | @ 11:00:00 | Stop: 2021-01-23 | NDC 00904696661

## 2021-01-23 MED ADMIN — OXYCODONE 5 MG PO TAB [10814]: 10 mg | ORAL | @ 07:00:00 | Stop: 2021-01-23 | NDC 00904696661

## 2021-01-23 MED ADMIN — SENNOSIDES-DOCUSATE SODIUM 8.6-50 MG PO TAB [40926]: 1 | ORAL | @ 14:00:00 | Stop: 2021-01-23 | NDC 60687062211

## 2021-01-23 MED ADMIN — DIAZEPAM 5 MG PO TAB [2405]: 5 mg | ORAL | @ 05:00:00 | NDC 51079028501

## 2021-01-23 MED ADMIN — MORPHINE 2 MG/ML IV SYRG [320653]: 2 mg | INTRAVENOUS | @ 02:00:00 | NDC 63323045200

## 2021-01-23 MED ADMIN — OXYCODONE 5 MG PO TAB [10814]: 5 mg | ORAL | @ 19:00:00 | Stop: 2021-01-23 | NDC 00904696661

## 2021-01-24 ENCOUNTER — Encounter: Admit: 2021-01-24 | Discharge: 2021-01-24 | Payer: MEDICARE

## 2021-01-24 DIAGNOSIS — E079 Disorder of thyroid, unspecified: Secondary | ICD-10-CM

## 2021-01-24 DIAGNOSIS — IMO0002 Ulcer: Secondary | ICD-10-CM

## 2021-01-24 DIAGNOSIS — M549 Dorsalgia, unspecified: Secondary | ICD-10-CM

## 2021-01-24 DIAGNOSIS — E039 Hypothyroidism, unspecified: Secondary | ICD-10-CM

## 2021-01-24 DIAGNOSIS — S83209A Unspecified tear of unspecified meniscus, current injury, unspecified knee, initial encounter: Secondary | ICD-10-CM

## 2021-01-24 DIAGNOSIS — F32A Depression: Secondary | ICD-10-CM

## 2021-01-24 DIAGNOSIS — D539 Nutritional anemia, unspecified: Secondary | ICD-10-CM

## 2021-01-24 DIAGNOSIS — M5136 Other intervertebral disc degeneration, lumbar region: Secondary | ICD-10-CM

## 2021-01-24 DIAGNOSIS — R519 Generalized headaches: Secondary | ICD-10-CM

## 2021-01-24 DIAGNOSIS — M5030 Other cervical disc degeneration, unspecified cervical region: Secondary | ICD-10-CM

## 2021-01-24 DIAGNOSIS — B999 Unspecified infectious disease: Secondary | ICD-10-CM

## 2021-01-24 DIAGNOSIS — C73 Malignant neoplasm of thyroid gland: Secondary | ICD-10-CM

## 2021-01-24 DIAGNOSIS — F419 Anxiety disorder, unspecified: Secondary | ICD-10-CM

## 2021-01-24 DIAGNOSIS — M5134 Other intervertebral disc degeneration, thoracic region: Secondary | ICD-10-CM

## 2021-01-24 DIAGNOSIS — I10 Essential (primary) hypertension: Secondary | ICD-10-CM

## 2021-01-24 DIAGNOSIS — R011 Cardiac murmur, unspecified: Secondary | ICD-10-CM

## 2021-01-24 DIAGNOSIS — M4800 Spinal stenosis, site unspecified: Secondary | ICD-10-CM

## 2021-01-24 DIAGNOSIS — C801 Malignant (primary) neoplasm, unspecified: Secondary | ICD-10-CM

## 2021-01-24 DIAGNOSIS — M2550 Pain in unspecified joint: Secondary | ICD-10-CM

## 2021-01-27 ENCOUNTER — Encounter: Admit: 2021-01-27 | Discharge: 2021-01-27 | Payer: MEDICARE

## 2021-01-27 NOTE — Telephone Encounter
I returned patient's call. She has been off her narcotics and has been doing well with tylenol and Voltaren. Now wondering how to better control her pins and needles pain that sometimes is sharp for the entire front chest.  Drain output #2 and #3 both have been 22.5 cc or less the last 2 days. Drain#4 is the highest with 40 out yesterday. She needed to move her nurse visit with me on Thursday due to driver has appointment then.  I gave pt appointment with [facility] tomorrow to discuss pain management and remove 2 drains. Until then, she will try using small amount Oxycodone and or Valium.

## 2021-01-28 ENCOUNTER — Ambulatory Visit: Admit: 2021-01-28 | Discharge: 2021-01-28 | Payer: MEDICARE

## 2021-01-28 ENCOUNTER — Encounter: Admit: 2021-01-28 | Discharge: 2021-01-28 | Payer: MEDICARE

## 2021-01-28 DIAGNOSIS — F419 Anxiety disorder, unspecified: Secondary | ICD-10-CM

## 2021-01-28 DIAGNOSIS — C73 Malignant neoplasm of thyroid gland: Secondary | ICD-10-CM

## 2021-01-28 DIAGNOSIS — S83209A Unspecified tear of unspecified meniscus, current injury, unspecified knee, initial encounter: Secondary | ICD-10-CM

## 2021-01-28 DIAGNOSIS — L987 Excessive and redundant skin and subcutaneous tissue: Secondary | ICD-10-CM

## 2021-01-28 DIAGNOSIS — F32A Depression: Secondary | ICD-10-CM

## 2021-01-28 DIAGNOSIS — M5134 Other intervertebral disc degeneration, thoracic region: Secondary | ICD-10-CM

## 2021-01-28 DIAGNOSIS — C50919 Malignant neoplasm of unspecified site of unspecified female breast: Secondary | ICD-10-CM

## 2021-01-28 DIAGNOSIS — C50112 Malignant neoplasm of central portion of left female breast: Secondary | ICD-10-CM

## 2021-01-28 DIAGNOSIS — E079 Disorder of thyroid, unspecified: Secondary | ICD-10-CM

## 2021-01-28 DIAGNOSIS — IMO0002 Ulcer: Secondary | ICD-10-CM

## 2021-01-28 DIAGNOSIS — E039 Hypothyroidism, unspecified: Secondary | ICD-10-CM

## 2021-01-28 DIAGNOSIS — R519 Generalized headaches: Secondary | ICD-10-CM

## 2021-01-28 DIAGNOSIS — M549 Dorsalgia, unspecified: Secondary | ICD-10-CM

## 2021-01-28 DIAGNOSIS — D539 Nutritional anemia, unspecified: Secondary | ICD-10-CM

## 2021-01-28 DIAGNOSIS — I10 Essential (primary) hypertension: Secondary | ICD-10-CM

## 2021-01-28 DIAGNOSIS — M4800 Spinal stenosis, site unspecified: Secondary | ICD-10-CM

## 2021-01-28 DIAGNOSIS — M5030 Other cervical disc degeneration, unspecified cervical region: Secondary | ICD-10-CM

## 2021-01-28 DIAGNOSIS — R011 Cardiac murmur, unspecified: Secondary | ICD-10-CM

## 2021-01-28 DIAGNOSIS — B999 Unspecified infectious disease: Secondary | ICD-10-CM

## 2021-01-28 DIAGNOSIS — C801 Malignant (primary) neoplasm, unspecified: Secondary | ICD-10-CM

## 2021-01-28 DIAGNOSIS — M2550 Pain in unspecified joint: Secondary | ICD-10-CM

## 2021-01-28 DIAGNOSIS — M5136 Other intervertebral disc degeneration, lumbar region: Secondary | ICD-10-CM

## 2021-01-28 NOTE — Progress Notes
Subjective:       History of Present Illness  Cindy Randolph is a 60 y.o. female.    09.01.22 s/p bilateral total mastectomy with closure (length of closure is 23 cm on the right and 22 cm on the left, total length of closure is 45 cm)    Here today for postop  Yesterday started having pins and needles and shooting pain to mastectomy   Had weaned off of oxycodone and valium, but took yesterday when started having pain and feels valium helped. Also taking Tylenol and Voltaren    Wearing breast binder   +appetite  +bowel movements         Review of Systems   Constitutional: Negative.    HENT: Negative.    Eyes: Negative.    Respiratory: Negative.    Cardiovascular: Negative.    Gastrointestinal: Negative.    Endocrine: Negative.    Genitourinary: Negative.    Musculoskeletal: Negative.    Skin: Negative.    Allergic/Immunologic: Negative.    Neurological: Negative.    Hematological: Negative.    Psychiatric/Behavioral: Negative.    All other systems reviewed and are negative.        Objective:         ? acetaminophen (TYLENOL) 325 mg tablet Take two tablets by mouth every 6 hours. Take scheduled for 3 days after surgery, then as needed. Do not exceed 4,000mg  in a 24 hour period.   ? aspirin EC 81 mg tablet Take one tablet by mouth daily for 42 days. Take with food.   ? diazePAM (VALIUM) 2 mg tablet Take 2 mg by mouth nightly as needed for Anxiety.   ? diazePAM (VALIUM) 5 mg tablet Take one tablet by mouth every 6 hours as needed (muscle spasm).   ? docusate (COLACE) 100 mg capsule Take one capsule by mouth twice daily for 30 days.   ? HYDROcodone/acetaminophen (NORCO) 5/325 mg tablet Take one tablet by mouth every 4 hours as needed for Pain for up to 20 doses.   ? Lactobacillus acidophilus (PROBIOTIC) 10 billion cell cap Take  by mouth daily.   ? levothyroxine (SYNTHROID) 100 mcg tablet Take 100 mcg by mouth daily 30 minutes before breakfast.   ? lisinopriL (ZESTRIL) 2.5 mg tablet Take 2.5 mg by mouth twice daily.   ? omega 3-dha-epa-fish oil (FISH OIL) 100-160-1,000 mg cap Take  by mouth three times weekly.   ? oxyCODONE (ROXICODONE) 5 mg tablet Take one tablet to two tablets by mouth every 6 hours as needed for Pain (rated 5/10 or higher).  Do not exceed 6 tabs in a 24 hour period.   ? traMADoL (ULTRAM) 50 mg tablet Take 50 mg by mouth every 8 hours as needed for Pain. Takes as little as 12.5 mg tramadol for pain.   ? vitamins, B complex tab Take 1 tablet by mouth three times weekly.   ? vitamins, multiple cap Take 1 capsule by mouth daily.     Vitals:    01/28/21 0944   BP: 108/73   BP Source: Arm, Right Upper   Pulse: 71   Temp: 36.9 ?C (98.4 ?F)   TempSrc: Skin   PainSc: Three   Weight: 65.5 kg (144 lb 6.4 oz)   Height: 160 cm (5' 3)     Body mass index is 25.58 kg/m?Marland Kitchen     Physical Exam  Vitals reviewed.   Pulmonary:      Effort: Pulmonary effort is normal.   Chest:  Comments: Bilateral mastectomy with no reconstruction. Therabond, tegaderm dressing over incisions. Mild ecchymosis, more to right. No s/s infection or fluid collections. JP drains, bilaterally  Skin:     General: Skin is warm and dry.   Neurological:      Mental Status: She is alert and oriented to person, place, and time.              Assessment and Plan:  09.01.22 s/p bilateral total mastectomy [facility]) with closure (length of closure is 23 cm on the right and 22 cm on the left, total length of closure is 45 cm)    Here today for follow up  Cindy Randolph has been experiencing what sounds like nerve pain since mastectomy surgery. We did discuss this is normal to have nerve pain after surgery. Discussed conservative treatment such as stretches and light touch to help desensitize these areas. Also, discussed using lidocaine patches. However, insurance does not usually cover this but can purchase over the counter. Also discussed gabapentin. We would order 100mg  po tid for 30 days, she would need to titrate off of this and not stop abruptly. Instructed that if gabapentin is needed after the 30 days we would refer her to [facility] for pain management. Cindy Randolph expressed that she would like to work on stretches and light touch therapy to see if this helps her discomfort. If she decides she would like to start Gabapentin, she will reach out to Cindy [place] (Cindy Randolph), RN to order this. Cindy Randolph expressed understanding and comfortable with the treatment plan. I also discussed she does have general appropriate postop swelling, which will continue to improve over the course of the next several months    Drain totals reviewed. Drains #1 and #3 meet removal criteria and removed today. CHG dressing applied over existing drain sites. Continue to monitor drain totals, when meets removal criteria (<30cc in 24hrs for 2 consecutive days) may be scheduled for nurse visit to have removed. May also remove therabond/tegaderm dressing at that time    - continue breast binder  - continue activity and lifting restrictions  - follow up 4 weeks with Dr.DeSouza        Cyndi Bender, APRN-NP

## 2021-01-29 ENCOUNTER — Encounter: Admit: 2021-01-29 | Discharge: 2021-01-29 | Payer: MEDICARE

## 2021-02-01 ENCOUNTER — Encounter: Admit: 2021-02-01 | Discharge: 2021-02-01 | Payer: MEDICARE

## 2021-02-02 ENCOUNTER — Encounter: Admit: 2021-02-02 | Discharge: 2021-02-02 | Payer: MEDICARE

## 2021-02-02 NOTE — Progress Notes
Obtained patient's verbal consent to treat them and their agreement to [facility] financial policy and NPP via this telehealth visit.      HPI:  12 days s/p Bilateral Total Mastectomy/Left SLNB with oncoplastic flat closure for grade 1 ILC.  Cindy Randolph is here via telehealth for her postop follow up. She has done well. Her incisions are  healing nicely per report; dressings and drains remain in place.   I have reviewed the pathology with her and supplied her with a copy.       PHYSICAL EXAM:    Breast:  bilateral    Incision:  therabond in place    Erythema:  No    Ecchymosis:  No    Seroma:  No  Axilla:    Incision:  n/a    Erythema:  No    Ecchymosis:  No    Seroma:  No    Upper Extremity:    ROM:  full     Winged scapula: No    Lymphedema:  No    PATHOLOGY:    A. Lymph nodes (2), left axillary sentinel lymph node #1, excision:   Metastatic mammary carcinoma involving two of two lymph nodes. (2/2)   Deeper sections and a pancytokeratin immunostain are positive in   support of the above diagnosis. ?     B. Lymph node (1), left axillary sentinel lymph node #2, excision:   Metastatic mammary carcinoma in one lymph?node. ?(1/1)   Deeper sections and a pancytokeratin immunostain are positive in   support of the above diagnosis. ?     C. Lymph node (1), left axillary sentinel lymph node #3, excision:   Metastatic mammary carcinoma in one lymph node. ?(1/1)   Deeper sections and a pancytokeratin immunostain are positive in   support of the above diagnosis. ? ?     D. Lymph node (1), left axillary sentinel lymph node #4, excision:   Metastatic mammary carcinoma in one lymph node. ?(1/1)   Deeper sections and a pancytokeratin immunostain are positive in   support of the above diagnosis. ? ?     E. Breast, left total mastectomy, mastectomy:   Invasive lobular carcinoma, histologic grade 1.   Lobular carcinoma in situ, low to intermediate grade.   Metallic clips (two) and prior biopsy site changes associated with the largest focus of tumor at 12 o'clock.   See synoptic report below.     F. Fibroadipose tissue and scant skeletal muscle, left additional   superior upper outer quadrant, clips mark true anterior margin, excision:   Negative for malignancy. ?     G. Fibroadipose tissue and scant skeletal muscle, left additional   superior flap, 12-2:00, clips mark new anterior margin, excision:   Negative for malignancy. ?     H. Breast, right total mastectomy, mastectomy:   Benign breast tissue with stromal fibrosis and incidental   fibroadenoma.     Comment:   INVASIVE CARCINOMA OF THE BREAST: RESECTION   CAP Version: Invasive Breast 4.7.0.0   Procedure: Mastectomy   Laterality: Left   Tumor Site: Upper central   Histologic Type: Invasive lobular carcinoma NOS   Histologic Grade (Nottingham Histologic Score): I/III   Tubular Differentiation: 3   Nuclear Grade: 1   Mitotic Rate (40x objective): 1   Total Nottingham Score: 5/9   Size of Invasive Component: Multiple foci of invasive carcinoma are shown   to merge together and involve tissue between the tumors, and therefore   span a  distance of approximately 7.4 x 4.5 x 1.7 cm     Surgical Margins:   Mastectomy   For invasive carcinoma: The closest margin is 1 mm?from anterior margin.   All remaining margins are greater than 2 mm.   For DCIS: N/a     Ductal Carcinoma In-situ (DCIS): Absent   Extensive intraductal component (>25%): N/a   DCIS within and/or adjacent to invasive carcinoma: N/a   DCIS separate from invasive carcinoma: N/a   Lobular Carcinoma In-situ (LCIS): Present, multiple foci   Lymph-Vascular Invasion: Not identified   Nipple Involvement: Absent   Skin Involvement: Absent   Skeletal Muscle: Not present   Regional Lymph Node Sampling:   Sentinel lymph node(s) only   Total Number of Lymph Nodes Examined: 5   Number of Sentinel Nodes Examined: 5   Number of lymph nodes with macrometastases (greater than 2 mm):   5   Number of lymph nodes with micrometastases (>0.2 mm to 2 mm   and/or greater than 200 cells): 0   Number of lymph nodes with isolated tumor cells (0.2 mm or less   or 200 cells or less): 0   Size of largest metastatic deposit (millimeters): 1.8 cm   Extranodal extension: Present; <46mm from capsule   Treatment Effect in the Breast: No known presurgical therapy   Distant Site(s) Involved, if applicable: Not applicable   Biomarker Testing: Ordered on block E37. The results will be issued as an   addendum.   Time between tumor removal and placement into formalin < 1 hour: Yes   Fixation Time between 6-72 hours: Yes   Pathologic Staging (pTNM, AJCC 8th edition): ?mpT3 snN2a        ASSESSMENT/PLAN:  S/P:  12 days s/p Bilateral Total Mastectomy/Left SLNB with oncoplastic flat closure for grade 1 ILC.     The pathology was reviewed with Cindy Randolph and she was given a copy of the results.  We recommend proceeding with ALND due to number of involved lymph nodes at diagnosis.  She will meet with Dr. Neil Crouch and discuss adjuvant therapy recommendations.  We will also consult with Dr. Neil Crouch to determine if ALND should take place prior to adjuvant therapy or following it.   She continues to follow closely with Dr. Dayton Bailiff post operatively.  We recommend consultation with radiation oncology, this will be arranged following ALND.   We will arrange education visit with lymphedema clinic as well.  She will return to clinic pending medical oncology discussion.  She was given ample time to ask questions all of which were answered to her satisfaction. She was encouraged to call with any interval questions or concerns.     1. Continue following with Dr. Neil Crouch (scheduled 9/15)  2. Follow up with Dr. Dayton Bailiff  3. LERN education visit  4. Plan ALND pending medical recommendations  5. Return to clinic pending     Nigel Berthold, PA-C    ATTESTATION    I personally performed the key portions of the E/M visit, discussed case with Physician Assistant and concur with documentation of history, physical exam, assessment, and treatment plan unless otherwise noted.     Patient is doing well overall   We reviewed our discussion from discuss yesterday   She is meeting with Dr Neil Crouch on 9/15 to discuss chemotherapy options, staging studies   If she opts to proceed with chemotherapy, we will proceed with cANd after treatment  If no chemotherapy is done, we will proceed with CAND now  Plan radiation oncology consult to discuss the role of PMRT   [facility] education   Rx for bras/prosthesis   Plan follow up with Dr Dayton Bailiff this week for drain removal   We will see her in 4 -6 months to plan surgical treatment - she will call us if no chemo is planned       Staff name:  Massie Kluver, MD Date:  02/03/2021

## 2021-02-02 NOTE — Progress Notes
Pathology is back from surgery. Dr. Marylou Mccoy told me to schedule post op visit and he needs 30 minutes.

## 2021-02-02 NOTE — Telephone Encounter
I spoke with patient and reviewed her pathology results     Discussed recommendation for  cAND and discussed timing pending systemic treatment decision; possible chemo; PMRT; drain removal this week     interested in staging studies - will discuss further with Dr Marylou Mccoy    She will follow up with Dr Marylou Mccoy and I will see her via [facility] as planned tomorrow for further discussion; will need rad onc follow up

## 2021-02-03 ENCOUNTER — Encounter: Admit: 2021-02-03 | Discharge: 2021-02-03 | Payer: MEDICARE

## 2021-02-03 DIAGNOSIS — C50919 Malignant neoplasm of unspecified site of unspecified female breast: Secondary | ICD-10-CM

## 2021-02-03 DIAGNOSIS — IMO0002 Ulcer: Secondary | ICD-10-CM

## 2021-02-03 DIAGNOSIS — M4800 Spinal stenosis, site unspecified: Secondary | ICD-10-CM

## 2021-02-03 DIAGNOSIS — D539 Nutritional anemia, unspecified: Secondary | ICD-10-CM

## 2021-02-03 DIAGNOSIS — I10 Essential (primary) hypertension: Secondary | ICD-10-CM

## 2021-02-03 DIAGNOSIS — R519 Generalized headaches: Secondary | ICD-10-CM

## 2021-02-03 DIAGNOSIS — F419 Anxiety disorder, unspecified: Secondary | ICD-10-CM

## 2021-02-03 DIAGNOSIS — R011 Cardiac murmur, unspecified: Secondary | ICD-10-CM

## 2021-02-03 DIAGNOSIS — M5030 Other cervical disc degeneration, unspecified cervical region: Secondary | ICD-10-CM

## 2021-02-03 DIAGNOSIS — M5136 Other intervertebral disc degeneration, lumbar region: Secondary | ICD-10-CM

## 2021-02-03 DIAGNOSIS — C50112 Malignant neoplasm of central portion of left female breast: Secondary | ICD-10-CM

## 2021-02-03 DIAGNOSIS — S83209A Unspecified tear of unspecified meniscus, current injury, unspecified knee, initial encounter: Secondary | ICD-10-CM

## 2021-02-03 DIAGNOSIS — E039 Hypothyroidism, unspecified: Secondary | ICD-10-CM

## 2021-02-03 DIAGNOSIS — F32A Depression: Secondary | ICD-10-CM

## 2021-02-03 DIAGNOSIS — M5134 Other intervertebral disc degeneration, thoracic region: Secondary | ICD-10-CM

## 2021-02-03 DIAGNOSIS — B999 Unspecified infectious disease: Secondary | ICD-10-CM

## 2021-02-03 DIAGNOSIS — C801 Malignant (primary) neoplasm, unspecified: Secondary | ICD-10-CM

## 2021-02-03 DIAGNOSIS — E079 Disorder of thyroid, unspecified: Secondary | ICD-10-CM

## 2021-02-03 DIAGNOSIS — Z9013 Acquired absence of bilateral breasts and nipples: Secondary | ICD-10-CM

## 2021-02-03 DIAGNOSIS — M549 Dorsalgia, unspecified: Secondary | ICD-10-CM

## 2021-02-03 DIAGNOSIS — M2550 Pain in unspecified joint: Secondary | ICD-10-CM

## 2021-02-03 DIAGNOSIS — C73 Malignant neoplasm of thyroid gland: Secondary | ICD-10-CM

## 2021-02-04 ENCOUNTER — Encounter: Admit: 2021-02-04 | Discharge: 2021-02-04 | Payer: MEDICARE

## 2021-02-04 ENCOUNTER — Ambulatory Visit: Admit: 2021-02-04 | Discharge: 2021-02-04 | Payer: MEDICARE

## 2021-02-04 DIAGNOSIS — Z4889 Encounter for other specified surgical aftercare: Secondary | ICD-10-CM

## 2021-02-04 NOTE — Progress Notes
Patient here for drain removal.  Dressings removed.  Incisions healing well, intact and no erythema. Any swelling within normal limits. Drain output 23cc or less each of the last 2 days. Pt states the white band was rubbing/irritating. Came in with just a snug cami shirt. She agreed to an ace wrap which I put an ABD over each side and the ace wrap. She will follow up on 10/5. She is pleased so far. Procedure: Drain Removal    The drain was removed with gentle traction and inspected and found to be intact.  The site was covered with bacitracin and band aid.    Drain Location: right and left chest    Removal date: [id number]    Complications: none    Removal Reason: Clinically Indicated

## 2021-02-05 ENCOUNTER — Encounter: Admit: 2021-02-05 | Discharge: 2021-02-05 | Payer: MEDICARE

## 2021-02-05 DIAGNOSIS — C773 Secondary and unspecified malignant neoplasm of axilla and upper limb lymph nodes: Secondary | ICD-10-CM

## 2021-02-05 DIAGNOSIS — S83209A Unspecified tear of unspecified meniscus, current injury, unspecified knee, initial encounter: Secondary | ICD-10-CM

## 2021-02-05 DIAGNOSIS — E039 Hypothyroidism, unspecified: Secondary | ICD-10-CM

## 2021-02-05 DIAGNOSIS — D539 Nutritional anemia, unspecified: Secondary | ICD-10-CM

## 2021-02-05 DIAGNOSIS — M4800 Spinal stenosis, site unspecified: Secondary | ICD-10-CM

## 2021-02-05 DIAGNOSIS — C50912 Malignant neoplasm of unspecified site of left female breast: Secondary | ICD-10-CM

## 2021-02-05 DIAGNOSIS — R519 Generalized headaches: Secondary | ICD-10-CM

## 2021-02-05 DIAGNOSIS — F32A Depression: Secondary | ICD-10-CM

## 2021-02-05 DIAGNOSIS — M5030 Other cervical disc degeneration, unspecified cervical region: Secondary | ICD-10-CM

## 2021-02-05 DIAGNOSIS — R011 Cardiac murmur, unspecified: Secondary | ICD-10-CM

## 2021-02-05 DIAGNOSIS — E079 Disorder of thyroid, unspecified: Secondary | ICD-10-CM

## 2021-02-05 DIAGNOSIS — IMO0002 Ulcer: Secondary | ICD-10-CM

## 2021-02-05 DIAGNOSIS — M549 Dorsalgia, unspecified: Secondary | ICD-10-CM

## 2021-02-05 DIAGNOSIS — M2550 Pain in unspecified joint: Secondary | ICD-10-CM

## 2021-02-05 DIAGNOSIS — B999 Unspecified infectious disease: Secondary | ICD-10-CM

## 2021-02-05 DIAGNOSIS — I10 Essential (primary) hypertension: Secondary | ICD-10-CM

## 2021-02-05 DIAGNOSIS — C50919 Malignant neoplasm of unspecified site of unspecified female breast: Secondary | ICD-10-CM

## 2021-02-05 DIAGNOSIS — C73 Malignant neoplasm of thyroid gland: Secondary | ICD-10-CM

## 2021-02-05 DIAGNOSIS — F419 Anxiety disorder, unspecified: Secondary | ICD-10-CM

## 2021-02-05 DIAGNOSIS — M5136 Other intervertebral disc degeneration, lumbar region: Secondary | ICD-10-CM

## 2021-02-05 DIAGNOSIS — C801 Malignant (primary) neoplasm, unspecified: Secondary | ICD-10-CM

## 2021-02-05 DIAGNOSIS — M5134 Other intervertebral disc degeneration, thoracic region: Secondary | ICD-10-CM

## 2021-02-05 MED ORDER — DEXAMETHASONE 6 MG PO TAB
12 mg | Freq: Once | ORAL | 0 refills
Start: 2021-02-05 — End: ?

## 2021-02-05 MED ORDER — CYCLOPHOSPHAMIDE IVPB
600 mg/m2 | Freq: Once | INTRAVENOUS | 0 refills
Start: 2021-02-05 — End: ?

## 2021-02-05 MED ORDER — PALONOSETRON 0.25 MG/5 ML IV SOLN
.25 mg | Freq: Once | INTRAVENOUS | 0 refills
Start: 2021-02-05 — End: ?

## 2021-02-05 MED ORDER — APREPITANT 7.2 MG/ML IV EMUL
130 mg | Freq: Once | INTRAVENOUS | 0 refills
Start: 2021-02-05 — End: ?

## 2021-02-05 MED ORDER — PEGFILGRASTIM-BMEZ 6 MG/0.6 ML SC SYRG
6 mg | Freq: Once | SUBCUTANEOUS | 0 refills
Start: 2021-02-05 — End: ?

## 2021-02-05 MED ORDER — DOXORUBICIN 2 MG/ML IV SOLN
60 mg/m2 | Freq: Once | INTRAVENOUS | 0 refills
Start: 2021-02-05 — End: ?

## 2021-02-06 ENCOUNTER — Encounter: Admit: 2021-02-06 | Discharge: 2021-02-06 | Payer: MEDICARE

## 2021-02-06 ENCOUNTER — Ambulatory Visit: Admit: 2021-02-06 | Discharge: 2021-02-06 | Payer: MEDICARE

## 2021-02-06 DIAGNOSIS — C50912 Malignant neoplasm of unspecified site of left female breast: Secondary | ICD-10-CM

## 2021-02-06 MED ORDER — CEFAZOLIN INJ 1GM IVP
2 g | Freq: Once | INTRAVENOUS | 0 refills
Start: 2021-02-06 — End: ?

## 2021-02-06 NOTE — Telephone Encounter
Spoke with Cindy Randolph. Arm measurement appointment scheduled on Tuesday 9/20 at 10:30 am. Verified patient is aware of appointment details.

## 2021-02-06 NOTE — Telephone Encounter
Pt scheduled for Port Insert with Dr. Joaquim Lai on 02/10/21 at [facility].    Instructed use of anesthesia vs. local only would be determined the morning of surgery.    Instructions given, pt states understanding.

## 2021-02-06 NOTE — Pre-Anesthesia Medication Instructions
YOUR MEDICATION LIST     acetaminophen (TYLENOL) 325 mg tablet Take two tablets by mouth every 6 hours. Take scheduled for 3 days after surgery, then as needed. Do not exceed 4,000mg  in a 24 hour period.    aspirin EC 81 mg tablet Take one tablet by mouth daily for 42 days. Take with food.    cholecalciferol (vitamin D3) (VITAMIN D3 PO) Take  by mouth.    diazePAM (VALIUM) 2 mg tablet Take 2 mg by mouth nightly as needed for Anxiety.    diazePAM (VALIUM) 5 mg tablet Take one tablet by mouth every 6 hours as needed (muscle spasm).    docusate (COLACE) 100 mg capsule Take one capsule by mouth twice daily for 30 days.    HYDROcodone/acetaminophen (NORCO) 5/325 mg tablet Take one tablet by mouth every 4 hours as needed for Pain for up to 20 doses. (Patient not taking: Reported on 02/05/2021)    Lactobacillus acidophilus (PROBIOTIC) 10 billion cell cap Take  by mouth daily.    levothyroxine (SYNTHROID) 100 mcg tablet Take 100 mcg by mouth daily 30 minutes before breakfast.    lisinopriL (ZESTRIL) 2.5 mg tablet Take 2.5 mg by mouth twice daily.    omega 3-dha-epa-fish oil (FISH OIL) 100-160-1,000 mg cap Take  by mouth three times weekly.    oxyCODONE (ROXICODONE) 5 mg tablet Take one tablet to two tablets by mouth every 6 hours as needed for Pain (rated 5/10 or higher).  Do not exceed 6 tabs in a 24 hour period. (Patient not taking: Reported on 02/05/2021)    traMADoL (ULTRAM) 50 mg tablet Take 50 mg by mouth every 8 hours as needed for Pain. Takes as little as 12.5 mg tramadol for pain.    vitamins, B complex tab Take 1 tablet by mouth three times weekly.    vitamins, multiple cap Take 1 capsule by mouth daily.         YOUR MEDICATION INSTRUCTIONS FOR SURGERY    Please continue taking your medicine as your doctor has told you to UNLESS it is listed to stop below.      14 DAYS BEFORE SURGERY  Do not take the following vitamins, herbals, and supplements:  Fish Oil  Vitamin B Complex  Multivitamin  Do not start any new vitamins, herbals, or natural supplements before surgery.      7 DAYS BEFORE SURGERY  DO NOT TAKE the following anti-inflammatory medications such as ibuprofen (Advil, Motrin) and naproxen (Aleve)  You may use acetaminophen (Tylenol)      PHARMACY WILL CONTACT YOU REGARDING THE FOLLOWING MEDICATION:  Aspirin      DAY BEFORE SURGERY  TAKE your medications the day before surgery as usual unless told differently      MORNING OF SURGERY  DO NOT take these medications:  Remaining vitamins/supplements  Ointments/creams/lotions  Docusate  Probiotic  Lisinopril    TAKE these medications with a sip (1-2 ounces) of water:  Levothyroxine  Use all inhalers, nasal sprays, and eye drops as usual  May take if needed:  Diazepam  Tramadol      Please contact Debbie, RN, with any changes to your medication list or medication questions before surgery.  E-mail:  [email or web address]  Phone: [phone]    Before going home from the hospital, please ask your doctor when you should re-start any medicine that was stopped for surgery.

## 2021-02-06 NOTE — Telephone Encounter
Spoke with Cindy Randolph. Per her request, lymphedema education telehealth appointment scheduled with Anson [place], RN on Monday 02/09/21 at 9 am. Plan to schedule in clinic appointment for bilateral circumferential arm measurements in coordination with upcoming future appointment. Verified patient is aware of appointment details.

## 2021-02-06 NOTE — Telephone Encounter
I called Cindy Randolph to schedule a telehealth visit for her post operative lymphedema education. She did not answer. I left a message including my call back number.

## 2021-02-06 NOTE — Progress Notes
[facility] phone triage completed with patient for surgery on 02/10/21 with Dr. Joaquim Lai.  Medications, allergies and medical history reviewed and updated in chart.  She has a hx of HTN that is controlled with medication.  She states there have been no changes in her health status since she was last seen in the OR 01/22/21.  Her medication list was updated.  Denies URI symptoms at this time. No PAC visit indicated.    Preop and medication instructions reviewed with patient. No vitamins or supplements for 14 days and no NSAIDS for 7 days before surgery. NPO after 11 pm the night before surgery but ok to drink water until 2 hours before arrival at hospital. Patient verbalized understanding and copy of instructions were placed in MyChart.    Patient was informed of mask and visitor policy and expressed understanding.  They were informed that all nail polish, jewelry and body piercings need to be removed prior to coming to the hospital.

## 2021-02-06 NOTE — Pre-Anesthesia Patient Instructions
GENERAL INFORMATION    Before you come to the hospital  If you are having an outpatient procedure, you will need to arrange for a responsible ride/person to accompany you home due to sedation or anesthesia with your procedure. A responsible person is a person who has the ability to identify a change in the patient's status and notify medical personnel.  This is typically a family member or friend.  Public transportation is permitted if you have a responsible person to accompany you.  An [employer], taxi or other public transportation driver is not considered a responsible person to accompany you home.  Bath/Shower Instructions  Take a bath or shower with antibacterial soap the night before or the morning of your procedure. Use clean towels.  Put on clean clothes after bath or shower.  Avoid using lotion and oils.  If you are having surgery above the waist, wear a shirt that fastens up the front.  Sleep on clean sheets if bath or shower is done the night before procedure.  Leave money, credit cards, jewelry, and any other valuables at home. [facility] is not responsible for the loss or breakage of personal items.  Remove nail polish, makeup and all jewelry (including piercings) before coming to the hospital.  The morning of your procedure:  brush your teeth and tongue  do not smoke, vape, chew tobacco or use any tobacco products  do not shave the area where you will have surgery    What to bring to the hospital  ID/ Insurance Card  Medical Device card  Official documents for legal guardianship   Copy of your Living Will, Advanced Directives, and/or Durable Power of Attorney.  If you have these documents, please bring them to the admissions office on the day of your surgery to be scanned into your records.  Small bag with a few personal belongings  Cases for glasses/hearing aids/contact lens (bring solutions for contacts)  Dress in clean, loose, comfortable clothing     Eating or drinking before surgery  Do not eat or drink anything after 11:00 p.m. the day before your procedure (including gum, mints, candy, or chewing tobacco) OR follow the specific instructions you were given by your Surgeon.  You may have WATER ONLY up to 2 hours before arriving at the hospital.     Other instructions  Notify your surgeon if:  there is a possibility that you are pregnant  you become ill with a cough, fever, sore throat, nausea, vomiting or flu-like symptoms  you have any open wounds/sores that are red, painful, draining, or are new since you last saw the doctor  you need to cancel your procedure    Notify us at [place]: [phone]  if you need to cancel your procedure  if you are going to be late    Arrival at the hospital  [facility]  [address]  [place], [place] [address]    This is at the [address] of [address]  Use the main entrance of the hospital, at the [facility] corner of the building. Parking is free.  Check-in for surgery is inside the main entrance.  If you are a woman between the ages of 38 and 62, and have not had a hysterectomy, you will be asked for a urine sample prior to surgery.  Please do not urinate before arriving in the Surgery Waiting Room.  Once there, check in and let the attendant know if  you need to provide a sample.  Phone carriers that use spam blockers will sometimes block our phone numbers. If your phone contact number is a mobile phone, please adjust your settings to make sure you receive our call.  In your phone settings, turn OFF the setting ?silence unknown callers.?  Please add these phone numbers to your contacts [phone] & [phone]      Your surgery is scheduled on 02/10/21 at 1:25 pm. Your arrival time is 11:55 am          For the safety of all patients, visitors and staff as we work to contain COVID-19, we must restrict patient visitors.    Current Visitor Policy (12/22/20):    Our current, and ongoing, visitor rules in surgery and procedural areas are:    2 visitors per patient will be allowed to accompany the patient and wait in the Waiting Room.  1 visitor will be allowed into the pre-op and post-op areas - Timing of this is at the discretion of the RN and will likely be upon completion of all pre-op activities and post-op assessments.     Patients in inpatient and pediatric units, Emergency Department, ambulatory clinics and lab appointments may only have two visitors.       For inpatient stays, patients may have 2 visitors at a time at their bedside. The two visitors can change throughout the day, but no more than two at a time may be bedside.  The policy applies to [facility] [place] [facility] [place], [address], [place] and [place] campuses and clinics.    Exceptions include:  No visitors allowed for patients with active COVID-19 infections.  Children younger than age 28 are allowed to visit inpatients.  Two parents/guardians are allowed for surgical or procedural patients younger than 59 years old.  Adult inpatients in semiprivate rooms may have visitors, but visits should be coordinated so only two total visitors are in a room at a time due to space limitations.    Visitors must be free of fever and symptoms to be in our facilities. We ask visitors to follow these guidelines:  Wear a mask at all times, unless under the age of 2, have trouble breathing or are unconscious, incapacitated or otherwise unable to remove the cover without assistance.  Go directly to the nursing station in the unit you are visiting and do not linger in public areas.  Check in at the nursing station before going to the patient's room.  Maintain a physical distance of six feet from all others.  Follow elevator restrictions to four riding at a time - peak times are 6:30-7:30 a.m., noon and 6:30-7:30 p.m.  Be aware cafeteria peak times are 11 a.m. - 1 p.m.  Wash your hands frequently and cover your coughs and sneezes.

## 2021-02-07 ENCOUNTER — Encounter: Admit: 2021-02-07 | Discharge: 2021-02-07 | Payer: MEDICARE

## 2021-02-08 ENCOUNTER — Encounter: Admit: 2021-02-08 | Discharge: 2021-02-08 | Payer: MEDICARE

## 2021-02-08 NOTE — Progress Notes
LYMPHEDEMA DATASHEET-BASELINE    Obtained patient's verbal consent to treat them and their agreement to [facility] financial policy and NPP via this telehealth visit during the Coronavirus Public Health Emergency     DATE:  02/08/2021    PATIENT NAME:   Cindy Randolph  DATE OF BIRTH:   06-05-1960  MRN:   [id number]    The patient presents today for post-operative lymphedema education.     Medical Team:   Surgeon: Leveda Anna  Plastic surgeon: Dayton Bailiff   Medical oncologist: Neil Crouch  Radiation oncologist: Berton Bon    Surgical Procedure: 01/22/21 - Bilateral mastectomy, left SLNB (5/5), complex closure.     BASELINE MEASUREMENTS  BIS = 2.0, Left - completed on 12/24/20    Handedness:  right handed    Risk factors for the development of breast cancer treatment related lymphedema include:   1. Bilateral mastectomy  2. Left SLNB (5/5) - Planning for ALND  3. Radiation Therapy - Plan pending  4. Chemotherapy - Plan pending  5. Other medical comorbidity: HTN    Lymphedema evaluation, teaching and care plan:     1. General review of lymphedema pathophysiology in breast cancer patients completed. Patient reports no previous history or experience/personal knowledge of lymphedema. No barriers to learning noted, patient willing and active participant.  Very interested in learning about topic.  Thoughtful questions demonstrated good insight into etiology and prevention.      2. Baseline assessment and measurements completed 12/24/20 including L-Dex Bioimpedance test (SOZO). Due to planned ALND, bilateral arm circumference utilizing a Juzo tape measure scheduled with Laural Benes, RN on 9/20.       3. Signs and symptoms to watch for once initial symptoms from surgery have resolved reviewed including but not limited to swelling, aching, heaviness, fullness in at risk area. Symptoms related to cellulitis or DVT should be reported urgently to surgery or medical oncology team.     4. Risk Factors/Precautions:   ? Obesity/weight gain (BMI greater than 30).  Optimal weight management is ideal for overall health.  Exercise without restrictions may be resumed when approved by surgeon.   ? Cancer and cancer treatments including surgery, radiation therapy, and certain types of chemotherapy are risk factors for developing lymphedema.   ? Precautions regarding meticulous skin care reviewed including keeping skin moisturized, protecting skin from burns and injuries, and reporting signs of infection.  Avoidance of blood pressures and needlesticks including medication injections, finger sticks, lab draws, and tattoos on affected arm discussed.   ? Early intervention can often reverse or prevent further swelling. Interventions may include the use of a compression sleeve, at home self-massage, and stretching exercises.    Discussed with [name] that she is currently at low risk for developing lymphedema. Since she will be eventually undergoing an ALND we reviewed ALND additional risk reduction recommendations, including the use of a compression sleeve for air travel, exercise, lifting and activities that require repetitive arm movements. Compression sleeve instructions discussed. A prescription for a prophylactic compression sleeve, compression sleeve instructions handout and list of area vendors provided. Instructed patient to wait until all surgical drains have been removed prior to being fitted for a compression sleeve.    5. When to call the office: Unrelenting aching, heaviness, fullness or swelling in arm or upper chest on the affected side. Signs and symptoms related to cellulitis or DVT should be reported urgently to the surgery or medical oncology team.      6. Lymphedema surveillance: BIS surveillance to be coordinated with  routine surgical follow-ups.     Written handouts regarding covered material provided prior to today's visit via MyChart.  Patient verified they received the attachments.    Total time 30 minutes. Estimated counseling time 25 minutes regarding signs symptoms, risk factors, monitoring, when to contact us and follow up plan.  Patient agreeable to follow up plan.     Provided patient with contact information for follow up should questions or concerns arise.

## 2021-02-09 ENCOUNTER — Encounter: Admit: 2021-02-09 | Discharge: 2021-02-09 | Payer: MEDICARE

## 2021-02-09 DIAGNOSIS — Z9889 Other specified postprocedural states: Secondary | ICD-10-CM

## 2021-02-09 DIAGNOSIS — M79661 Pain in right lower leg: Secondary | ICD-10-CM

## 2021-02-10 ENCOUNTER — Encounter: Admit: 2021-02-10 | Discharge: 2021-02-10 | Payer: MEDICARE

## 2021-02-10 ENCOUNTER — Ambulatory Visit: Admit: 2021-02-10 | Discharge: 2021-02-10 | Payer: MEDICARE

## 2021-02-10 DIAGNOSIS — C773 Secondary and unspecified malignant neoplasm of axilla and upper limb lymph nodes: Secondary | ICD-10-CM

## 2021-02-10 DIAGNOSIS — C50912 Malignant neoplasm of unspecified site of left female breast: Secondary | ICD-10-CM

## 2021-02-10 DIAGNOSIS — M79661 Pain in right lower leg: Secondary | ICD-10-CM

## 2021-02-10 DIAGNOSIS — Z9189 Other specified personal risk factors, not elsewhere classified: Secondary | ICD-10-CM

## 2021-02-10 DIAGNOSIS — Z9889 Other specified postprocedural states: Secondary | ICD-10-CM

## 2021-02-10 MED ORDER — IOHEXOL 350 MG IODINE/ML IV SOLN
100 mL | Freq: Once | INTRAVENOUS | 0 refills | Status: CP
Start: 2021-02-10 — End: ?
  Administered 2021-02-10: 16:00:00 100 mL via INTRAVENOUS

## 2021-02-10 MED ORDER — RP DX TC-99M MEDRONATE MCI
25 | Freq: Once | INTRAVENOUS | 0 refills | Status: CP
Start: 2021-02-10 — End: ?
  Administered 2021-02-10: 16:00:00 25.4 via INTRAVENOUS

## 2021-02-10 MED ORDER — SODIUM CHLORIDE 0.9 % IJ SOLN
50 mL | Freq: Once | INTRAVENOUS | 0 refills | Status: CP
Start: 2021-02-10 — End: ?
  Administered 2021-02-10: 16:00:00 50 mL via INTRAVENOUS

## 2021-02-10 NOTE — Progress Notes
LYMPHEDEMA DATASHEET-BASELINE    DATE:  02/10/2021    PATIENT NAME:   Cindy Randolph  DATE OF BIRTH:   1961/02/14  MRN:   [id number]    Breast Surgery History: 01/22/21 - Bilateral mastectomy, left SLNB (5/5), with Dr. Bea Graff. Closure by Dr. Leverne Humbles     Circumferential Measurements   RUE/LUE cm   Hand:  18.8 /  18.7    Wrist:  15.8 /  15.9     8 cm:  18.2 /  17.8   16 cm:  22.8 /  22.1   Elbow cm:  24.9 /  24.4   8 cm:  27.2 /  25.6   16 cm:  30.1 /  28.5   24 cm:  33.1 /  33.5   Left axillary crease: 29.5   Left arm length: 42.0     Saw Cindy Randolph today to obtain arm measurements before completing left ALND. Reviewed goal of visit. Explained goal of evaluation is to aid with future lymphedema monitoring. Assessment and measurements completed, including bilateral arm circumference utilizing a Juzo tape measure,ROM, strength and functional status. All assessments WNL. Provided Cindy Randolph with prescription for compression sleeve (Juzo size 2 max reg CCL1). Cindy Randolph plans to obtain sleeve today at [place].      Plan: ALND education reviewed yesterday. Follow up after procedure (no surgery date yet) to review ALND precautions.          Follow up: Verified patient has contact information should any questions or concerns arise.

## 2021-02-11 ENCOUNTER — Encounter: Admit: 2021-02-11 | Discharge: 2021-02-11 | Payer: MEDICARE

## 2021-02-11 DIAGNOSIS — M2550 Pain in unspecified joint: Secondary | ICD-10-CM

## 2021-02-11 DIAGNOSIS — R519 Generalized headaches: Secondary | ICD-10-CM

## 2021-02-11 DIAGNOSIS — C801 Malignant (primary) neoplasm, unspecified: Secondary | ICD-10-CM

## 2021-02-11 DIAGNOSIS — E079 Disorder of thyroid, unspecified: Secondary | ICD-10-CM

## 2021-02-11 DIAGNOSIS — E039 Hypothyroidism, unspecified: Secondary | ICD-10-CM

## 2021-02-11 DIAGNOSIS — I10 Essential (primary) hypertension: Secondary | ICD-10-CM

## 2021-02-11 DIAGNOSIS — S83209A Unspecified tear of unspecified meniscus, current injury, unspecified knee, initial encounter: Secondary | ICD-10-CM

## 2021-02-11 DIAGNOSIS — M5030 Other cervical disc degeneration, unspecified cervical region: Secondary | ICD-10-CM

## 2021-02-11 DIAGNOSIS — IMO0002 Ulcer: Secondary | ICD-10-CM

## 2021-02-11 DIAGNOSIS — M5134 Other intervertebral disc degeneration, thoracic region: Secondary | ICD-10-CM

## 2021-02-11 DIAGNOSIS — F32A Depression: Secondary | ICD-10-CM

## 2021-02-11 DIAGNOSIS — B999 Unspecified infectious disease: Secondary | ICD-10-CM

## 2021-02-11 DIAGNOSIS — M5136 Other intervertebral disc degeneration, lumbar region: Secondary | ICD-10-CM

## 2021-02-11 DIAGNOSIS — C50919 Malignant neoplasm of unspecified site of unspecified female breast: Secondary | ICD-10-CM

## 2021-02-11 DIAGNOSIS — M4800 Spinal stenosis, site unspecified: Secondary | ICD-10-CM

## 2021-02-11 DIAGNOSIS — M549 Dorsalgia, unspecified: Secondary | ICD-10-CM

## 2021-02-11 DIAGNOSIS — D539 Nutritional anemia, unspecified: Secondary | ICD-10-CM

## 2021-02-11 DIAGNOSIS — F419 Anxiety disorder, unspecified: Secondary | ICD-10-CM

## 2021-02-11 DIAGNOSIS — C73 Malignant neoplasm of thyroid gland: Secondary | ICD-10-CM

## 2021-02-11 DIAGNOSIS — R011 Cardiac murmur, unspecified: Secondary | ICD-10-CM

## 2021-02-11 NOTE — Telephone Encounter
Cindy Randolph called with some questions regarding her upcoming appointments. She wanted to know if she would be getting labs every Friday before chemo, then seeing Dr. Marylou Mccoy one week after each treatment. I explained that the labs are being drawn at her teach visit with Margreta Journey on Friday in order to get a baseline chemistry panel and CBC, but after this her labs will be drawn the same day as treatment. I explained then to plan on seeing either Dr. Marylou Mccoy or Margreta Journey every couple of weeks prior to treatment, then she will come back to clinic the day after treatment for her Neulasta injection. She voiced understanding and thanked me for clarifying, and I encouraged her to call if she had any additional questions.

## 2021-02-12 ENCOUNTER — Encounter: Admit: 2021-02-12 | Discharge: 2021-02-12 | Payer: MEDICARE

## 2021-02-12 DIAGNOSIS — C50912 Malignant neoplasm of unspecified site of left female breast: Secondary | ICD-10-CM

## 2021-02-12 NOTE — Progress Notes
see My Chart message.  I entered order for cranial prosthesis and will fax to [facility].

## 2021-02-13 ENCOUNTER — Encounter: Admit: 2021-02-13 | Discharge: 2021-02-13 | Payer: MEDICARE

## 2021-02-13 ENCOUNTER — Ambulatory Visit: Admit: 2021-02-13 | Discharge: 2021-02-13 | Payer: MEDICARE

## 2021-02-13 DIAGNOSIS — M5136 Other intervertebral disc degeneration, lumbar region: Secondary | ICD-10-CM

## 2021-02-13 DIAGNOSIS — C73 Malignant neoplasm of thyroid gland: Secondary | ICD-10-CM

## 2021-02-13 DIAGNOSIS — C50919 Malignant neoplasm of unspecified site of unspecified female breast: Secondary | ICD-10-CM

## 2021-02-13 DIAGNOSIS — C50912 Malignant neoplasm of unspecified site of left female breast: Secondary | ICD-10-CM

## 2021-02-13 DIAGNOSIS — M5134 Other intervertebral disc degeneration, thoracic region: Secondary | ICD-10-CM

## 2021-02-13 DIAGNOSIS — F32A Depression: Secondary | ICD-10-CM

## 2021-02-13 DIAGNOSIS — B999 Unspecified infectious disease: Secondary | ICD-10-CM

## 2021-02-13 DIAGNOSIS — I10 Essential (primary) hypertension: Secondary | ICD-10-CM

## 2021-02-13 DIAGNOSIS — R011 Cardiac murmur, unspecified: Secondary | ICD-10-CM

## 2021-02-13 DIAGNOSIS — M4800 Spinal stenosis, site unspecified: Secondary | ICD-10-CM

## 2021-02-13 DIAGNOSIS — D539 Nutritional anemia, unspecified: Secondary | ICD-10-CM

## 2021-02-13 DIAGNOSIS — C773 Secondary and unspecified malignant neoplasm of axilla and upper limb lymph nodes: Secondary | ICD-10-CM

## 2021-02-13 DIAGNOSIS — F419 Anxiety disorder, unspecified: Secondary | ICD-10-CM

## 2021-02-13 DIAGNOSIS — M5030 Other cervical disc degeneration, unspecified cervical region: Secondary | ICD-10-CM

## 2021-02-13 DIAGNOSIS — IMO0002 Ulcer: Secondary | ICD-10-CM

## 2021-02-13 DIAGNOSIS — Z719 Counseling, unspecified: Secondary | ICD-10-CM

## 2021-02-13 DIAGNOSIS — R519 Generalized headaches: Secondary | ICD-10-CM

## 2021-02-13 DIAGNOSIS — C801 Malignant (primary) neoplasm, unspecified: Secondary | ICD-10-CM

## 2021-02-13 DIAGNOSIS — E039 Hypothyroidism, unspecified: Secondary | ICD-10-CM

## 2021-02-13 DIAGNOSIS — C50112 Malignant neoplasm of central portion of left female breast: Secondary | ICD-10-CM

## 2021-02-13 DIAGNOSIS — M2550 Pain in unspecified joint: Secondary | ICD-10-CM

## 2021-02-13 DIAGNOSIS — M549 Dorsalgia, unspecified: Secondary | ICD-10-CM

## 2021-02-13 DIAGNOSIS — S83209A Unspecified tear of unspecified meniscus, current injury, unspecified knee, initial encounter: Secondary | ICD-10-CM

## 2021-02-13 DIAGNOSIS — E079 Disorder of thyroid, unspecified: Secondary | ICD-10-CM

## 2021-02-13 LAB — CBC AND DIFF
ABSOLUTE BASO COUNT: 0.1 K/UL (ref 0–0.20)
ABSOLUTE EOS COUNT: 0.2 K/UL (ref 0–0.45)
ABSOLUTE LYMPH COUNT: 1.5 K/UL (ref 1.0–4.8)
ABSOLUTE MONO COUNT: 0.6 K/UL (ref 0–0.80)
ABSOLUTE NEUTROPHIL: 3.3 K/UL (ref 1.8–7.0)
BASOPHILS %: 1 % (ref 0–2)
EOSINOPHILS %: 3 % (ref >60)
HEMATOCRIT: 36 % (ref >60)
HEMOGLOBIN: 12 g/dL (ref 12.0–15.0)
LYMPHOCYTES %: 27 % (ref 24–44)
MCH: 31 pg (ref 26–34)
MCHC: 33 g/dL (ref 32.0–36.0)
MCV: 94 FL (ref 80–100)
MONOCYTES %: 10 % (ref 4–12)
MPV: 8.7 FL (ref 7–11)
NEUTROPHILS %: 59 % (ref 41–77)
PLATELET COUNT: 197 K/UL (ref 150–400)
RBC COUNT: 3.8 M/UL — ABNORMAL LOW (ref 4.0–5.0)
RDW: 14 % (ref 11–15)
WBC COUNT: 5.6 K/UL — ABNORMAL LOW (ref 4.5–11.0)

## 2021-02-13 LAB — COMPREHENSIVE METABOLIC PANEL
POTASSIUM: 4.1 MMOL/L (ref 3.5–5.1)
SODIUM: 135 MMOL/L — ABNORMAL LOW (ref 137–147)

## 2021-02-13 MED ORDER — PROCHLORPERAZINE MALEATE 10 MG PO TAB
10 mg | ORAL_TABLET | ORAL | 2 refills | Status: AC | PRN
Start: 2021-02-13 — End: ?

## 2021-02-13 MED ORDER — LIDOCAINE-PRILOCAINE 2.5-2.5 % TP CREA
3 refills | Status: AC
Start: 2021-02-13 — End: ?

## 2021-02-13 MED ORDER — ONDANSETRON HCL 8 MG PO TAB
8 mg | ORAL_TABLET | ORAL | 2 refills | 8.00000 days | Status: AC | PRN
Start: 2021-02-13 — End: ?

## 2021-02-13 MED ORDER — DEXAMETHASONE 4 MG PO TAB
8 mg | ORAL_TABLET | Freq: Every day | ORAL | 0 refills | 12.50000 days | Status: AC
Start: 2021-02-13 — End: ?

## 2021-02-14 ENCOUNTER — Encounter: Admit: 2021-02-14 | Discharge: 2021-02-14 | Payer: MEDICARE

## 2021-02-16 ENCOUNTER — Encounter: Admit: 2021-02-16 | Discharge: 2021-02-16 | Payer: MEDICARE

## 2021-02-16 NOTE — Progress Notes
NAME: Cindy Randolph    [place] MRN: [id number]    DOB: Feb 08, 1961    [facility]   Genetic Counseling  Visit Date: 02/20/2021     Referring physician: Oletta Cohn, MD     Cindy Randolph, 60 y.o., was called for discussion of her personal history of breast cancer and family history of cancer. The patient's verbal consent was obtained to provide this telehealth visit due to the [facility].     Indication  Personal history of unilateral breast cancer, stage I, and thyroid cancer  Family history of cancer (breast)    Summary of Session   ? Based on NCCN guidelines, Cindy Randolph meets the clinical criteria for a multi-gene panel test. Her genetic testing results could affect her clinical management recommendations.  ? After discussing the risks, benefits, and limitations of genetic testing, Cindy Randolph elected to proceed with testing. We ordered the following test:  Karna Dupes' CustomNext-Cancer?  +RNA insight?:  46 genes: APC, ATM, AXIN2, BARD1, BMPR1A, BRCA1, BRCA2, BRIP1, CDH1, CDK4, CDKN1B, CDKN2A, CHEK2, DICER1, EPCAM (deletion/duplication), [id number] (sequencing), GREM1 (deletion/duplication), HOXB13 (sequencing), MEN1, MLH1, MSH2, MSH3, MSH6, MUTYH, NBN, NF1, NTHL1, PALB2, PALLD (sequencing), PMS2, POLD1 (sequencing), POLE (sequencing), PRKAR1A, PTEN, RAD51C, RAD51D, RECQL, RET, RINT1 (sequencing), SDHB, SDHD, SMAD4, SMARCA4, STK11, TERT (sequencing), TP53  ? A follow-up appointment to receive the results will be scheduled following today's visit. Medical management recommendations will be made based on the result of her genetic testing.    Clinical History   Cindy Randolph was diagnosed with thyroid cancer in 1979, at the age of 65 (unknown type, but it was called a slow growing cancer by her doctor). This was treated with complete thyroidectomy at [facility].     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 This was treated with bilateral mastectomy, and there was no evidence of disease in the right breast. She will be starting chemotherapy on Monday with plans to do radiation following completion of chemotherapy.     Patient Reported Cancer Surveillance History:  ? Mammograms: most recent in July 2022  ? Breast biopsies: July 2022 most recent; had a fine needle biopsy 20 years ago of the L breast after concerns noted on mammogram but came back benign   ? Uterus and ovaries intact? yes  ? Last Pap: May 2022, normal; no hx of abnormal paps   ? Colonoscopies: 2017, due for one soon   ? EGD: yes, hx of esophagitis (stress induced)  ? Total # of polyps: 1 benign colon polyp  ? Dermatology: annually; removed pre-cancer spot near hairline with no recurrence    Medical History:   Diagnosis Date   ? Acquired hypothyroidism    ? Anxiety 2016    from pain   ? Back pain    ? Breast cancer in female [facility]) 12/2020    Left   ? Cancer of thyroid (HCC)    ? Degenerative disc disease, cervical 1994   ? Degenerative disc disease, lumbar 2010   ? Degenerative disc disease, thoracic 2010   ? Depression situational, transient    after divorce, after death of 2nd husband   ? Essential hypertension began in 3rd trimester most pregnancies    remained after last pregnancy   ? Essential hypertension, benign as above   ? Generalized headaches 1996-2010    probably hormonally related   ? Heart murmur at birth    Dr. Maisie Fus said  he didn't hear it a few years ago   ? Infection    ? Joint pain 1994   ? Other malignant neoplasm without specification of site thyroid, 1980    thyroidectomy   ? Spinal stenosis 2017 to present    per MRIs   ? Thyroid disorder as noted   ? Torn meniscus 12/25/2020   ? Ulcer 1987    with divorce; resolved   ? Unspecified deficiency anemia 2009    from excessive bleeding post-partum; treated iron     Cindy Randolph informed me that she has hereditary degenerative osteoarthritis, and that her paternal grandmother also had ostoearthritis. Cindy Randolph has one heberden nodule on her pointer finger.     Surgical History:   Procedure Laterality Date   ? HX WRIST FRACTURE SURGERY  1993    Baker's thumb with fixation   ? ARTHROPLASTY  2001    L ACL   ? COLONOSCOPY  2017    normal   ? ARTHROSCOPY KNEE WITH PARTIAL LATERAL MENISCECTOMY AND LEFT KNEE INJECTION. Right 01/08/2021    Performed by Vopat, Lowry Ram, MD at [facility]   ? BILATERAL TOTAL MASTECTOMIES Bilateral 01/22/2021    Performed by Massie Kluver, MD at [facility]   ? INTRAOPERATIVE SENTINEL LYMPH NODE IDENTIFICATION WITH/ WITHOUT NON-RADIOACTIVE DYE INJECTION Left 01/22/2021    Performed by Massie Kluver, MD at [facility]   ? INJECTION RADIOACTIVE TRACER FOR SENTINEL NODE IDENTIFICATION Left 01/22/2021    Performed by Massie Kluver, MD at [facility]   ? LEFT AXILLARY SENTINEL LYMPH NODE BIOPSY Left 01/22/2021    Performed by Massie Kluver, MD at [facility]   ? BILATERAL CHEST FLAT CLOSURE Bilateral 01/22/2021    Performed by Erlene Quan, Zachary George, MD at [facility]   ? BILATERAL CHEST FLAT CLOSURE x 8 Bilateral 01/22/2021    Performed by Stevenson Clinch, MD at [facility]   ? EXCISION BENIGN LESION 0.5 CM OR LESS - TORSO Right 01/22/2021    Performed by Erlene Quan, Zachary George, MD at [facility]   ? KNEE SURGERY  L ACL as above   ? THYROIDECTOMY          A detailed, 3-generation family history was obtained. The pedigree is available under the History tab, and will be scanned and uploaded to the Media tab in the patient's chart under document type Clinician Attachment to MyChart.    Family History   Problem Relation Age of Onset   ? Arthritis Paternal Grandmother         (osteoarthritis) multiple heberdens noduls and deformities   ? Arthritis Mother         wear and tear   ? Cancer-Breast Mother 78        was on HRT for 10-15 years. Unilateral mastectomy   ? Basal Cell Carcinoma Brother    ? Basal Cell Carcinoma Brother    ? Basal Cell Carcinoma Brother    ? Birth Defect Daughter         PRS, congenital diaphragmatic hernia, malrotation, grey matter heterotopia   ? Thyroid Disease  (goiter, removed) Maternal Uncle    ? Birth Defect Nephew         craniosynostosis   ? Cancer-Breast Maternal Great-Aunt 34     Jewish Ancestry: Ashkenazi Jewish on paternal side    The information provided is based on the patient's recollection of the family history and in the absence of complete  medical records. If the family history changes or if more information is obtained, Cindy Randolph can contact our offices as this may alter their recommendations for testing.       Discussion   Hereditary cancer syndrome risk assessment:  The majority of cases of cancer are sporadic, meaning cancer occurs by chance. The second largest proportion of cancer diagnoses are familial, meaning shared environmental and hereditary factors cause cancer in multiple family members, but we are not able to identify one cause in the family. Hereditary cancers make up the smallest proportion of all cancer diagnoses (in general, around 5-10% of cancer diagnoses). Inheriting a mutation does not automatically mean that one will develop cancer, rather that there is an increased chance for developing certain types of cancer.      Hereditary cancers are typically identified through observing multiple family members with cancer through multiple generations of a family. Additionally, these cancers tend to have an earlier age of diagnosis in comparison to the general population, often with a diagnosis of cancer prior to the age of 59 years old. Lastly, multiple primary cancers in the one individual and clusters of certain types of cancer seen in family members can be indicative of a potential genetic predisposition to cancer. Cindy Randolph's personal and family history is suggestive of a hereditary cancer predisposition considering her personal history of two primary cancers, maternal family history of breast cancer, and paternal limited family structure and Ashkenazi Jewish ancestry.     Genetic education:   We discussed hereditary cancer syndromes involving the cancers in the family.  Female breast cancer accounts for approximately 15% of all new cancer diagnoses each year. An average female's risk of developing breast cancer during her lifetime is about 1 in 8 (12.9%). Most cases of breast cancer occur sporadically (by chance) or by a combination of genetic and environmental factors, but approximately 10% of female breast cancers are caused by an identifiable hereditary cancer syndrome. One of the risk factors for breast cancer is an inherited genetic mutation in one of two genes: BRCA1 and BRCA2; however, other hereditary cancer syndromes can also increase an individual's risk for breast cancer.   Thyroid cancer accounts for approximately 2.3% of all new cancer diagnoses each year. An average person's risk of developing thyroid during their lifetime is about 1 in 83 (1.2%). Most cases of thyroid cancer occur sporadically (by chance), but approximately 5-10% are due to an identifiable hereditary cancer syndrome. Examples of hereditary cancer syndromes involving thyroid cancer includes mutations in the following genes: RET and TP53; however, other hereditary cancer syndromes can also increase an individual's risk for thyroid cancer.     The majority of hereditary cancer syndromes are inherited in an autosomal dominant pattern, meaning an individual with a mutation that increases their risk for cancer has a 50% chance of passing the mutation on to each child. Likewise, a child of an individual with a mutation associated with hereditary cancer risk has a 50% chance of having inherited that mutation from their parent.      It is important to note that the genes listed above are not the only genes that cause hereditary cancer syndromes which increases risk for cancer. Testing for moderate to high-risk cancer genes can inform the patient and at-risk family members of a potential familial pathogenic (cancer-causing) variant. Appropriate management and screening for individuals is not always clear for individuals with moderate-risk gene mutations. In some of these cases, even with a positive test result, medical management recommendations may still be based  upon personal and family history.     Genetic testing guidelines and recommendations:   We discussed the [employer] (NCCN) guidelines regarding genetic testing for hereditary cancer syndromes. Based on NCCN guidelines, Cindy Randolph meets the clinical criteria (Ashkenazi Jewish ancestry, at least 3 total breast cancer diagnoses in the patient and/or close blood relatives [first-, second-, and third degree relatives on the same side of the family]) for a multi-gene panel test, thus Cindy Randolph was recommended to have genetic testing.     Types of genetic testing results and their medical management implications:   There are 3 types of results that can come back from a gene panel including: positive (a known mutation was identified), negative (no known mutations were identified), and a variant of uncertain significance / VUS (a genetic change was identified, but the effect of that change is not known).   ? Positive: For an individual with cancer, a positive result may help explain the cause of their cancer. Gene-specific recommendations for medical management, which may include cancer screenings and other risk-reduction options, will be provided to the patient. However, not all gene mutations that are associated with cancer have clear management guidelines when a pathogenic variant is identified. In some of these cases, even with a positive test result, medical management recommendations may still be based upon personal and family history. In these cases, if a mutation is detected, the patient will be referred back to the referring provider and to any additional appropriate care providers to discuss the relevant options for management.  ? Negative: Known common hereditary genetic mutations would be ruled out for the patient. Recommendations for the patient will be made based on personal and family history factors in accordance with relevant NCCN and American Cancer Society guidelines for cancer surveillance. There are a few possible reasons someone may receive a negative test result:  o There is no identifiable mutation in Cindy Randolph's genetic code that increases their risk for hereditary cancer and/or that may explain the cause of their cancer.   o There may be a cancer-causing mutation in Cindy Randolph's family, but due to the 50% chance of it being passed on, Cindy Randolph did not inherit the mutation.   o There may be a cancer-causing mutation in the family that has not yet been discovered that we are unable to test for.   o Limitations in current methodology of testing may have not detected a mutation. Currently methodology for genetic testing identifies most gene mutations; however, a small percentage of mutations may not be identified.   ? VUS: A VUS result means the laboratory does not have enough information at this time to determine whether the change is a benign (harmless) genetic variant or if it is associated with an increased risk of cancer. As more information becomes available, some VUS are eventually reclassified, and research shows that if a VUS is reclassified approximately 90% will turn out to be negative/benign. Recommendations for the patient will be made based on personal and family history factors in accordance with relevant NCCN and American Cancer Society guidelines for cancer surveillance.    Familial implications by type of result:   ? Positive: There is a 50% chance of passing the mutation to each child. In addition, siblings have a 50% chance to have inherited the same pathogenic variant. Other family members may also be at risk of having inherited the familial pathogenic variant. Cancer predisposing gene mutations can exist in males and females and can be passed on to both sons  and daughters. Testing of family members of an individual with a positive result is recommended.   ? Negative: There is nothing identifiable that the patient can pass on; however, the patient and their family members should still be screened for cancer based on personal and family history and the national general population guidelines.    ? VUS: We do not recommend testing at-risk family members unless the VUS is later reclassified as a positive (pathogenic) result.    Cost and Insurance:   Regarding cost of testing, Karna Dupes will reach out to [place] by calling the number [phone] if there is a cost over $100 after running insurance. The maximum out-of-pocket cost for testing is $250 via [facility] patient self-pay option. Additionally, [employer] offers a patient assistance program that uses household size and income information to determine if patients can qualify for reduced prices. If contacted by Cindy Hazy also has the option to cancel testing at this time if they so choose. If Cindy Randolph has any questions or concerns regarding billing or financial assistance, Cindy Randolph can contact the billing department by calling [phone] or emailing [email or web address].     Federal laws (GINA, ACA) protect from medical insurance discrimination; however, it is important to note that employer with less than 15 employees do not have to comply with the Genetic Information Nondiscrimination Act (GINA) of 2008. This private sector leads to about 15% of the [place] workforce not having federal-level protections against genetic discrimination in employment. Some individuals still have these protections as several states extend the employment protections to include businesses with fewer than fifteen employees. If you have specific questions, please reach out to a lawyer to discuss your specific state laws. There are currently no federal laws in place which protect an individual who is found to have a hereditary cancer syndrome from discrimination for life insurance, long term care insurance, or disability insurance. For more information regarding potential insurance coverage please refer to [email or web address].     Other topics discussed included:   We discussed that testing for hereditary cancer syndromes is recommended for individuals 18 years or older, in order to promote patient autonomy and in recognition that the majority of hereditary cancer syndromes include risks for adult-onset cancers that have no screening or management recommendations in childhood. Pending results of Cindy Randolph's genetic testing, we will discuss any relevant childhood screening recommendations at their return appointment.     Decision about genetic testing:   We discussed what size of gene panel that Cindy Randolph would want to pursue. There are a select number of genes that have known guidelines when a mutation is identified; whereas additional genes can be added on for a more thorough investigation of the potential inherited genetic variants that increase risk for cancer. However, there are not clear management guidelines for all of these additional genes. Cindy Randolph decided that a panel focused on cancers in the family (breast, thyroid) as well as other common hereditary cancers would be all the genetic testing that she would like to have investigated at this time to increase chances of gaining actionable information from this testing to guide her medical management and inform her family.     After discussing the risks, benefits and limitations of undergoing genetic testing including the potential implications of test results on clinical management and the fact that the clinical use and understanding of some of these genes included on the multi-gene panel analysis is limited, the patient provided informed consent for the following test:    Karna Dupes' CustomNext-Cancer?  +  RNA insight?:  46 genes: APC, ATM, AXIN2, BARD1, BMPR1A, BRCA1, BRCA2, BRIP1, CDH1, CDK4, CDKN1B, CDKN2A, CHEK2, DICER1, EPCAM (deletion/duplication), [id number] (sequencing), GREM1 (deletion/duplication), HOXB13 (sequencing), MEN1, MLH1, MSH2, MSH3, MSH6, MUTYH, NBN, NF1, NTHL1, PALB2, PALLD (sequencing), PMS2, POLD1 (sequencing), POLE (sequencing), PRKAR1A, PTEN, RAD51C, RAD51D, RECQL, RET, RINT1 (sequencing), SDHB, SDHD, SMAD4, SMARCA4, STK11, TERT (sequencing), TP53    Unless otherwise stated, Next Generation Sequencing (NGS) and deletion / duplication analysis is performed for all genes. RNA data is routinely analyzed for use in variant interpretation for all genes.    This custom multi-gene panel includes genes associated with the following hereditary cancers: breast, ovarian, pancreatic, prostate, melanoma, colon, stomach, uterine, kidney, brain, and endocrine.     Plan   1. Orders for genetic testing will be placed with [facility].   2. For convienience, the schedulers will add on her blood draw for genetic testing to [facility] exisitng lab appointment on October 3rd, 2022.   3. Once the blood is drawn, the sample will be sent to the testing lab for analysis, and results are expected to be available in 3-4 weeks.   4. Results will then be discussed at the patient's follow up visit.   Our schedulers will reach out to [facility] soon to schedule a return appointment. If results are not available around the time of the scheduled return appointment, the schedulers will reach out to [facility] to reschedule.     Cindy Randolph is encouraged to reach out the [facility]red[facility] Cancer Clinic at the [facility] with any questions or concerns following today's visit at [phone].     Time: Direct patient care: 60 minutes.  Indirect patient care: 45 minutes. Obtained and reviewed medical, surgical, and family history.  Discussed hereditary cancer syndromes, autosomal dominant inheritance / other inheritance as relevant, pre-symptomatic surveillance, management, and treatment. Possible testing outcomes, including possibility of uncertain variants, strategies for testing other family members if a gene alteration is found. Insurance information, current protections under federal legislation, and out-of-pocket price discussed. Completed paperwork including verbal consent for genetic testing.    Roma Kayser, MS, [facility] (she/her)  Patent examiner of [facility] [employer]  [address] [place], [place]  [address]    Email: [email or web address]  Telephone: [phone]  Fax: [phone]  Appointments: [phone]

## 2021-02-18 ENCOUNTER — Encounter: Admit: 2021-02-18 | Discharge: 2021-02-18 | Payer: MEDICARE

## 2021-02-18 ENCOUNTER — Ambulatory Visit: Admit: 2021-02-18 | Discharge: 2021-02-18 | Payer: MEDICARE

## 2021-02-18 DIAGNOSIS — L987 Excessive and redundant skin and subcutaneous tissue: Secondary | ICD-10-CM

## 2021-02-18 DIAGNOSIS — R011 Cardiac murmur, unspecified: Secondary | ICD-10-CM

## 2021-02-18 DIAGNOSIS — F32A Depression: Secondary | ICD-10-CM

## 2021-02-18 DIAGNOSIS — E079 Disorder of thyroid, unspecified: Secondary | ICD-10-CM

## 2021-02-18 DIAGNOSIS — S83209A Unspecified tear of unspecified meniscus, current injury, unspecified knee, initial encounter: Secondary | ICD-10-CM

## 2021-02-18 DIAGNOSIS — M5136 Other intervertebral disc degeneration, lumbar region: Secondary | ICD-10-CM

## 2021-02-18 DIAGNOSIS — F419 Anxiety disorder, unspecified: Secondary | ICD-10-CM

## 2021-02-18 DIAGNOSIS — C50919 Malignant neoplasm of unspecified site of unspecified female breast: Secondary | ICD-10-CM

## 2021-02-18 DIAGNOSIS — M5030 Other cervical disc degeneration, unspecified cervical region: Secondary | ICD-10-CM

## 2021-02-18 DIAGNOSIS — E039 Hypothyroidism, unspecified: Secondary | ICD-10-CM

## 2021-02-18 DIAGNOSIS — M2550 Pain in unspecified joint: Secondary | ICD-10-CM

## 2021-02-18 DIAGNOSIS — M5134 Other intervertebral disc degeneration, thoracic region: Secondary | ICD-10-CM

## 2021-02-18 DIAGNOSIS — B999 Unspecified infectious disease: Secondary | ICD-10-CM

## 2021-02-18 DIAGNOSIS — R519 Generalized headaches: Secondary | ICD-10-CM

## 2021-02-18 DIAGNOSIS — I10 Essential (primary) hypertension: Secondary | ICD-10-CM

## 2021-02-18 DIAGNOSIS — M549 Dorsalgia, unspecified: Secondary | ICD-10-CM

## 2021-02-18 DIAGNOSIS — C73 Malignant neoplasm of thyroid gland: Secondary | ICD-10-CM

## 2021-02-18 DIAGNOSIS — M4800 Spinal stenosis, site unspecified: Secondary | ICD-10-CM

## 2021-02-18 DIAGNOSIS — IMO0002 Ulcer: Secondary | ICD-10-CM

## 2021-02-18 DIAGNOSIS — D539 Nutritional anemia, unspecified: Secondary | ICD-10-CM

## 2021-02-18 DIAGNOSIS — C801 Malignant (primary) neoplasm, unspecified: Secondary | ICD-10-CM

## 2021-02-18 DIAGNOSIS — C50112 Malignant neoplasm of central portion of left female breast: Secondary | ICD-10-CM

## 2021-02-18 MED ORDER — DIAZEPAM 2 MG PO TAB
2 mg | ORAL_TABLET | ORAL | 0 refills | 7.00000 days | Status: AC | PRN
Start: 2021-02-18 — End: ?

## 2021-02-18 NOTE — Progress Notes
Clinical Nutrition Assessment Summary    Cindy Randolph is a 60 y.o. female with  Malignant neoplasm of left breast in female, estrogen receptor positive     Past Medical History: Anxiety, heachaches    Current Treatment Plan: ddAC    Nutrition Assessment of Patient:  Height: 158.8 cm (5' 2.52)  BMI Categories Adult: Over Weight: 25-29.9  Desired Weight: 62 kg  Estimated Calorie Needs: 1300-1550 (21-25)  Estimated Protein Needs: 74 (1.2)  Needs to promote: weight maintainence (prevent weight gain)    Wt Readings from Last 5 Encounters:   02/18/21 63.5 kg (140 lb)   02/13/21 63.5 kg (140 lb)   02/13/21 63.6 kg (140 lb 3.2 oz)   02/05/21 63.7 kg (140 lb 6.4 oz)   01/28/21 65.5 kg (144 lb 6.4 oz)      Body mass index is 25.18 kg/m?Marland Kitchen    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 She does not drink alcohol and plans to do walking for exercise. She explained she is a big water drinker, says her serum sodium tends to run on the lower side.    Malnutrition Assessment: Adequately nourished.    Pertinent Labs: Na 135    Pertinent Food Allergies/Intolerance: No known food allergies.    Nutrition Diagnosis:  Food & nutrition-related knowledge deficit related to Breast Cancer Nutrition.    Intervention/Plan:  ? Discussed nutritional/lifestyle guidelines for breast cancer, along with calorie and protein needs.  ? We discussed importance of preventing weight gain and working toward healthier weight as to decrease risk for comorbidities and cancer recurrence.  ? Encouraged small, frequent meals and healthful snacks.A Plant based diet was encouraged; recommending 1? - 2 cups of fruits and 2 - 3 cups of vegetables.  ? We discussed healthy diet in relation to weight control. I educated the patient on MyPlate eating plan (1/4 plate lean protein, 1/4 plate starches, 1/2 plate low starch veggies). We discussed appropriate portion sizes. Aim for at least 5 servings of fruits and vegetables per day. Also focus on whole grains to obtain 25-30g fiber per day. Include more lean protein sources such as fish, poultry, legumes verses red and processed meat. Patient was provided with handout. Provided patient with a 1600 calorie meal plan, tips for increasing fruits/vegetables and healthy protein shake recipes to be used for healthy snacks or meal replacements.   ? Discussed avoiding processed meats limiting red and to AICR recommendation of less than 18 ounces per week.   ? Physical activity was encouraged as tolerated as strategy for weight control and healthy lifestyle behavior changes.   ? Reinforced need for adequate hydration during chemotherapy.  ? Patient was provided with RD's contact information for further assistance if needed. Will be available PRN.    Nutrition Monitoring and Evaluation:  Goal:prevent weight gain  Time Frame:During treatment    The patient was allowed to ask questions and actively participated in creating plan of care. I provided the patient with my contact information and instructed pt on how to get in touch with me if questions or concerns arise. Thank you for allowing nutrition services to participate in this patients care.     Doree Albee RDN, CSO, [place]  Oncology Nutrition Specialist  [phone]

## 2021-02-18 NOTE — Progress Notes
Subjective:       History of Present Illness  Cindy Randolph is a 60 y.o. female.  S/p closure after bilateral mastectomy 01/22/21  Starts chemo soon. Will get 16-20 weeks.   Would like to know if she is healed to start chemo.       Review of Systems   Constitutional: Negative.    HENT: Negative.    Eyes: Negative.    Respiratory: Negative.    Cardiovascular: Negative.    Gastrointestinal: Positive for nausea.   Endocrine: Negative.    Genitourinary: Negative.    Musculoskeletal: Negative.    Skin: Negative.    Allergic/Immunologic: Negative.    Neurological: Negative.    Hematological: Negative.    Psychiatric/Behavioral: Negative.          Objective:         ? acetaminophen (TYLENOL) 325 mg tablet Take two tablets by mouth every 6 hours. Take scheduled for 3 days after surgery, then as needed. Do not exceed 4,000mg  in a 24 hour period.   ? cholecalciferol (vitamin D3) (VITAMIN D3 PO) Take  by mouth.   ? [START ON 02/22/2021] dexAMETHasone (DECADRON) 4 mg tablet Take two tablets by mouth daily. On Days 2-4 of each cycle.   ? diazePAM (VALIUM) 2 mg tablet Take 2 mg by mouth nightly as needed for Anxiety.   ? diclofenac sodium DR (VOLTAREN) 50 mg tablet Take 50 mg by mouth twice daily. Take with food.   ? Lactobacillus acidophilus (PROBIOTIC) 10 billion cell cap Take  by mouth daily.   ? levothyroxine (SYNTHROID) 100 mcg tablet Take 100 mcg by mouth daily 30 minutes before breakfast.   ? lidocaine/prilocaine (EMLA) 2.5/2.5 % topical cream Apply a thin film over port one hour prior to treatment. Do not rub in. Cover with plastic wrap after application   ? lisinopriL (ZESTRIL) 2.5 mg tablet Take 2.5 mg by mouth twice daily.   ? omega 3-dha-epa-fish oil (FISH OIL) 100-160-1,000 mg cap Take  by mouth three times weekly.   ? [START ON 02/22/2021] ondansetron HCL (ZOFRAN) 8 mg tablet Take one tablet by mouth every 8 hours as needed (nausea and vomiting).   ? [START ON 02/22/2021] prochlorperazine maleate (COMPAZINE) 10 mg tablet Take one tablet by mouth every 6 hours as needed for Nausea or Vomiting.   ? traMADoL (ULTRAM) 50 mg tablet Take 50 mg by mouth every 8 hours as needed for Pain. Takes as little as 12.5 mg tramadol for pain.   ? vitamins, B complex tab Take 1 tablet by mouth three times weekly.   ? vitamins, multiple cap Take 1 capsule by mouth daily.     Vitals:    02/18/21 0819   BP: 116/80   BP Source: Arm, Right Upper   Pulse: 73   Temp: 36.7 ?C (98.1 ?F)   TempSrc: Skin   PainSc: Zero   Weight: 63.5 kg (140 lb)   Height: 158.8 cm (5' 2.5)     Body mass index is 25.2 kg/m?Marland Kitchen     Physical Exam  Vitals reviewed.   Constitutional:       General: She is not in acute distress.  Cardiovascular:      Rate and Rhythm: Normal rate.   Pulmonary:      Effort: Pulmonary effort is normal.   Chest:      Comments: Incisions intact.  Contour is smooth  Neurological:      Mental Status: She is alert.   Psychiatric:  Mood and Affect: Mood normal.         Thought Content: Thought content normal.              Assessment and Plan:    Refilled Valium, which she is taking for poking/stabbing pain.   She cuts the 5 mg into 4 doses and takes TID.  We discussed that she may need to start gabapentin if this is a long lasting feeling.    She can massage the scar, or apply silicone ointment.   Avoid the sun.     Return after chemo.  She needs to have ALND, left side. If she needs a revision, would plan to do as a combined surgery. She expects that surgery will be in February.     Photos at next visit.    Problem List Items Addressed This Visit     Malignant neoplasm of left breast in female, estrogen receptor positive (HCC) - Primary    Excess skin of breast

## 2021-02-19 ENCOUNTER — Encounter: Admit: 2021-02-19 | Discharge: 2021-02-19 | Payer: MEDICARE

## 2021-02-19 ENCOUNTER — Ambulatory Visit: Admit: 2021-02-19 | Discharge: 2021-02-19 | Payer: MEDICARE

## 2021-02-19 MED ORDER — PROPOFOL 10 MG/ML IV EMUL 50 ML (INFUSION)(AM)(OR)
INTRAVENOUS | 0 refills | Status: DC
Start: 2021-02-19 — End: 2021-02-19

## 2021-02-19 MED FILL — TRAMADOL 50 MG PO TAB: 50 mg | ORAL | 2 days supply | Qty: 6 | Fill #1 | Status: CP

## 2021-02-20 ENCOUNTER — Encounter: Admit: 2021-02-20 | Discharge: 2021-02-20 | Payer: MEDICARE

## 2021-02-20 DIAGNOSIS — M5136 Other intervertebral disc degeneration, lumbar region: Secondary | ICD-10-CM

## 2021-02-20 DIAGNOSIS — I10 Essential (primary) hypertension: Secondary | ICD-10-CM

## 2021-02-20 DIAGNOSIS — E039 Hypothyroidism, unspecified: Secondary | ICD-10-CM

## 2021-02-20 DIAGNOSIS — M549 Dorsalgia, unspecified: Secondary | ICD-10-CM

## 2021-02-20 DIAGNOSIS — B999 Unspecified infectious disease: Secondary | ICD-10-CM

## 2021-02-20 DIAGNOSIS — C50112 Malignant neoplasm of central portion of left female breast: Secondary | ICD-10-CM

## 2021-02-20 DIAGNOSIS — M5134 Other intervertebral disc degeneration, thoracic region: Secondary | ICD-10-CM

## 2021-02-20 DIAGNOSIS — C73 Malignant neoplasm of thyroid gland: Secondary | ICD-10-CM

## 2021-02-20 DIAGNOSIS — M4800 Spinal stenosis, site unspecified: Secondary | ICD-10-CM

## 2021-02-20 DIAGNOSIS — IMO0002 Ulcer: Secondary | ICD-10-CM

## 2021-02-20 DIAGNOSIS — C801 Malignant (primary) neoplasm, unspecified: Secondary | ICD-10-CM

## 2021-02-20 DIAGNOSIS — Z803 Family history of malignant neoplasm of breast: Secondary | ICD-10-CM

## 2021-02-20 DIAGNOSIS — F32A Depression: Secondary | ICD-10-CM

## 2021-02-20 DIAGNOSIS — S83209A Unspecified tear of unspecified meniscus, current injury, unspecified knee, initial encounter: Secondary | ICD-10-CM

## 2021-02-20 DIAGNOSIS — R519 Generalized headaches: Secondary | ICD-10-CM

## 2021-02-20 DIAGNOSIS — D539 Nutritional anemia, unspecified: Secondary | ICD-10-CM

## 2021-02-20 DIAGNOSIS — F419 Anxiety disorder, unspecified: Secondary | ICD-10-CM

## 2021-02-20 DIAGNOSIS — R011 Cardiac murmur, unspecified: Secondary | ICD-10-CM

## 2021-02-20 DIAGNOSIS — C50919 Malignant neoplasm of unspecified site of unspecified female breast: Secondary | ICD-10-CM

## 2021-02-20 DIAGNOSIS — M5030 Other cervical disc degeneration, unspecified cervical region: Secondary | ICD-10-CM

## 2021-02-20 DIAGNOSIS — E079 Disorder of thyroid, unspecified: Secondary | ICD-10-CM

## 2021-02-20 DIAGNOSIS — M2550 Pain in unspecified joint: Secondary | ICD-10-CM

## 2021-02-23 ENCOUNTER — Encounter: Admit: 2021-02-23 | Discharge: 2021-02-23 | Payer: MEDICARE

## 2021-02-23 DIAGNOSIS — E079 Disorder of thyroid, unspecified: Secondary | ICD-10-CM

## 2021-02-23 DIAGNOSIS — M5136 Other intervertebral disc degeneration, lumbar region: Secondary | ICD-10-CM

## 2021-02-23 DIAGNOSIS — M549 Dorsalgia, unspecified: Secondary | ICD-10-CM

## 2021-02-23 DIAGNOSIS — C801 Malignant (primary) neoplasm, unspecified: Secondary | ICD-10-CM

## 2021-02-23 DIAGNOSIS — C50112 Malignant neoplasm of central portion of left female breast: Secondary | ICD-10-CM

## 2021-02-23 DIAGNOSIS — I10 Essential (primary) hypertension: Secondary | ICD-10-CM

## 2021-02-23 DIAGNOSIS — R519 Generalized headaches: Secondary | ICD-10-CM

## 2021-02-23 DIAGNOSIS — C73 Malignant neoplasm of thyroid gland: Secondary | ICD-10-CM

## 2021-02-23 DIAGNOSIS — R011 Cardiac murmur, unspecified: Secondary | ICD-10-CM

## 2021-02-23 DIAGNOSIS — E039 Hypothyroidism, unspecified: Secondary | ICD-10-CM

## 2021-02-23 DIAGNOSIS — IMO0002 Ulcer: Secondary | ICD-10-CM

## 2021-02-23 DIAGNOSIS — M5030 Other cervical disc degeneration, unspecified cervical region: Secondary | ICD-10-CM

## 2021-02-23 DIAGNOSIS — F419 Anxiety disorder, unspecified: Secondary | ICD-10-CM

## 2021-02-23 DIAGNOSIS — Z803 Family history of malignant neoplasm of breast: Secondary | ICD-10-CM

## 2021-02-23 DIAGNOSIS — C50919 Malignant neoplasm of unspecified site of unspecified female breast: Secondary | ICD-10-CM

## 2021-02-23 DIAGNOSIS — F32A Depression: Secondary | ICD-10-CM

## 2021-02-23 DIAGNOSIS — B999 Unspecified infectious disease: Secondary | ICD-10-CM

## 2021-02-23 DIAGNOSIS — S83209A Unspecified tear of unspecified meniscus, current injury, unspecified knee, initial encounter: Secondary | ICD-10-CM

## 2021-02-23 DIAGNOSIS — M4800 Spinal stenosis, site unspecified: Secondary | ICD-10-CM

## 2021-02-23 DIAGNOSIS — M2550 Pain in unspecified joint: Secondary | ICD-10-CM

## 2021-02-23 DIAGNOSIS — M5134 Other intervertebral disc degeneration, thoracic region: Secondary | ICD-10-CM

## 2021-02-23 DIAGNOSIS — D539 Nutritional anemia, unspecified: Secondary | ICD-10-CM

## 2021-02-23 MED ORDER — HEPARIN, PORCINE (PF) 100 UNIT/ML IV SYRG
500 [IU] | Freq: Once | 0 refills | Status: CP
Start: 2021-02-23 — End: ?

## 2021-02-23 MED ORDER — APREPITANT 7.2 MG/ML IV EMUL
130 mg | Freq: Once | INTRAVENOUS | 0 refills | Status: CP
Start: 2021-02-23 — End: ?
  Administered 2021-02-23: 15:00:00 130 mg via INTRAVENOUS

## 2021-02-23 MED ORDER — DEXAMETHASONE 6 MG PO TAB
12 mg | Freq: Once | ORAL | 0 refills | Status: CP
Start: 2021-02-23 — End: ?
  Administered 2021-02-23: 15:00:00 12 mg via ORAL

## 2021-02-23 MED ORDER — LORAZEPAM 0.5 MG PO TAB
.5 mg | ORAL_TABLET | ORAL | 0 refills | 12.00000 days | Status: AC | PRN
Start: 2021-02-23 — End: ?

## 2021-02-23 MED ORDER — CYCLOPHOSPHAMIDE IVPB
600 mg/m2 | Freq: Once | INTRAVENOUS | 0 refills | Status: CP
Start: 2021-02-23 — End: ?
  Administered 2021-02-23 (×2): 1000 mg via INTRAVENOUS

## 2021-02-23 MED ORDER — PALONOSETRON 0.25 MG/5 ML IV SOLN
.25 mg | Freq: Once | INTRAVENOUS | 0 refills | Status: CP
Start: 2021-02-23 — End: ?
  Administered 2021-02-23: 15:00:00 0.25 mg via INTRAVENOUS

## 2021-02-23 MED ORDER — DOXORUBICIN 2 MG/ML IV SOLN
60 mg/m2 | Freq: Once | INTRAVENOUS | 0 refills | Status: CP
Start: 2021-02-23 — End: ?
  Administered 2021-02-23: 16:00:00 100.8 mg via INTRAVENOUS

## 2021-02-23 NOTE — Telephone Encounter
Cindy Randolph left a message saying she was nauseas since her chemo today. She said she had the watering in her mouth and some stomach cramps. She got home after chemo and took one Compazine but called to let us know her nausea was not controlled. I called her back and got her voicemail, I explained that she should start alternating the compazine and zofran every 6-8 hours to help with nausea. I told her if she did this and then was still throwing up persistently to let us know, otherwise her body just needed to settle from today. I told her we would touch base in the morning as well.

## 2021-02-23 NOTE — Progress Notes
Day 1 Cycle 1 AC    Port accessed, labs collected.  OK to treat, labs OK to treat.  Tolerated infusion well.   Port de-accessed per protocol, heparinized.   Discharged in stable condition.       CHEMO NOTE  Verified chemo consent signed and in chart.    Blood return positive via: Port (Single)    BSA and dose double checked (agree with orders as written) with: yes     Labs/applicable tests checked: CBC and Comprehensive Metabolic Panel (CMP)    Chemo regimen: Drug/cycle/day  C1 D1 AC    Rate verified and armband double check with second RN: yes, see eMar    Patient education offered and stated understanding. Denies questions at this time.

## 2021-02-23 NOTE — Patient Instructions
Call Immediately to report the following:  Uncontrolled nausea and/or vomiting, uncontrolled pain, or unusual bleeding.  Temperature of 100.4 F or greater and/or any sign/symptom of infection (redness, warmth, tenderness)  Painful mouth or difficulty swallowing  Red, cracked, or painful hands and/or feet  Diarrhea   Swelling of arms or legs  Rash    Important Phone Numbers:  [facility] Main Number (answered 24 hours a day) [phone]  Cancer Center Scheduling (appointments) [phone] OR [phone]  [facility] (for nutritional supplements) [phone]        Port Maintenance - If you have a port, it should be flushed every 6-8 weeks when not in use.  Please check with your MD, nurse, or the scheduler.

## 2021-02-24 ENCOUNTER — Encounter: Admit: 2021-02-24 | Discharge: 2021-02-24 | Payer: MEDICARE

## 2021-02-24 ENCOUNTER — Emergency Department: Admit: 2021-02-24 | Discharge: 2021-02-24 | Discharge status: Against Medical Advice | Payer: MEDICARE

## 2021-02-24 NOTE — Telephone Encounter
I called Cindy Randolph to follow up on her night. I got a hold of her husband Cindy Randolph who said that she was currently admitted at [facility]. He said that they had waiting in the hallway at [facility] for 3 hours so they had an ambulance bring them to [place] instead. He explained that she had started throwing up then developed chest pain and called the on call doctor.They were instructed to go to the ER. I told Bruce that our on-call physicians have very limited resources after hours, so going to the emergency room was the most appropriate thing to do.     He explained that at [facility] they found that her electrolytes were all off, and she had already received 3 bags of fluids. He said they were thinking about discharging her today. I told him that sounded okay but also reminded him that she needs to get her Neulasta injection either today or tomorrow. He is to call me back after sharing this information with Cindy Randolph's nurse to see if it would be possible to administer the injection there, otherwise she will need come to clinic tomorrow.

## 2021-02-24 NOTE — Telephone Encounter
Had 1st [facility] today and called with ongoing nausea.    Has been alternating zofran and compazine and still with nausea and HA.    Trying natural things and discussed ginger ale, but thought sweet, not appealing.    Discussed Ativan.  They think local pharmacy closes at 8pm, so not sure if they can get.  Did send script for 0.5mg  to use prn and warned about sedation.      Due in clinic for neulasta tomorrow and told her to let the clinic know in the am if she might need extra fluids.

## 2021-02-24 NOTE — Telephone Encounter
Pt and husband had called back just before midnight with c/o continued nausea, headache, chest burning.  They were not able to get the ativan as pharmacy had closed.  Advised they should go to ER for evaluation, potential IVF and medication.  Called back around 230am from [place] [facility] - had not been seen yet, in hallway.  Concerned about immune system and being around other pts.    I told them I thought she was in right place and needed evaluation.  He mentioned someone said might be admitted and if felt appropriate, they should follow those directions.  If not able to make clinic appt for neulasta, can be rescheduled.

## 2021-02-24 NOTE — ED Notes
PT LEFT C SO

## 2021-02-25 ENCOUNTER — Encounter: Admit: 2021-02-25 | Discharge: 2021-02-25 | Payer: MEDICARE

## 2021-02-25 DIAGNOSIS — M549 Dorsalgia, unspecified: Secondary | ICD-10-CM

## 2021-02-25 DIAGNOSIS — M5136 Other intervertebral disc degeneration, lumbar region: Secondary | ICD-10-CM

## 2021-02-25 DIAGNOSIS — M2550 Pain in unspecified joint: Secondary | ICD-10-CM

## 2021-02-25 DIAGNOSIS — E079 Disorder of thyroid, unspecified: Secondary | ICD-10-CM

## 2021-02-25 DIAGNOSIS — B999 Unspecified infectious disease: Secondary | ICD-10-CM

## 2021-02-25 DIAGNOSIS — E039 Hypothyroidism, unspecified: Secondary | ICD-10-CM

## 2021-02-25 DIAGNOSIS — M5030 Other cervical disc degeneration, unspecified cervical region: Secondary | ICD-10-CM

## 2021-02-25 DIAGNOSIS — C50112 Malignant neoplasm of central portion of left female breast: Secondary | ICD-10-CM

## 2021-02-25 DIAGNOSIS — S83209A Unspecified tear of unspecified meniscus, current injury, unspecified knee, initial encounter: Secondary | ICD-10-CM

## 2021-02-25 DIAGNOSIS — M4800 Spinal stenosis, site unspecified: Secondary | ICD-10-CM

## 2021-02-25 DIAGNOSIS — C50919 Malignant neoplasm of unspecified site of unspecified female breast: Secondary | ICD-10-CM

## 2021-02-25 DIAGNOSIS — F419 Anxiety disorder, unspecified: Secondary | ICD-10-CM

## 2021-02-25 DIAGNOSIS — IMO0002 Ulcer: Secondary | ICD-10-CM

## 2021-02-25 DIAGNOSIS — I10 Essential (primary) hypertension: Secondary | ICD-10-CM

## 2021-02-25 DIAGNOSIS — D539 Nutritional anemia, unspecified: Secondary | ICD-10-CM

## 2021-02-25 DIAGNOSIS — M5134 Other intervertebral disc degeneration, thoracic region: Secondary | ICD-10-CM

## 2021-02-25 DIAGNOSIS — F32A Depression: Secondary | ICD-10-CM

## 2021-02-25 DIAGNOSIS — C801 Malignant (primary) neoplasm, unspecified: Secondary | ICD-10-CM

## 2021-02-25 DIAGNOSIS — C73 Malignant neoplasm of thyroid gland: Secondary | ICD-10-CM

## 2021-02-25 DIAGNOSIS — R011 Cardiac murmur, unspecified: Secondary | ICD-10-CM

## 2021-02-25 DIAGNOSIS — R519 Generalized headaches: Secondary | ICD-10-CM

## 2021-02-25 MED ORDER — PEGFILGRASTIM-BMEZ 6 MG/0.6 ML SC SYRG
6 mg | Freq: Once | SUBCUTANEOUS | 0 refills | Status: CP
Start: 2021-02-25 — End: ?
  Administered 2021-02-25: 15:00:00 6 mg via SUBCUTANEOUS

## 2021-02-25 MED ORDER — SODIUM CHLORIDE 0.9 % IV SOLP
1000 mL | Freq: Once | INTRAVENOUS | 0 refills
Start: 2021-02-25 — End: ?

## 2021-02-25 NOTE — Telephone Encounter
I called Cindy Randolph to see how she was feeling. She had come into clinic for her Neulasta injection earlier and there was a question about whether she needed IV fluids for the symptoms she was still having. Cindy Randolph said that she is feeling so much better than she was earlier this week. I explained that since she had already gotten so much fluid at [facility] yesterday, we wanted to hold off today to avoid overloading her. She understood, and said she has been able to eat and drink more today, but was still feeling a little lightheaded.     We discussed coming in on Friday to get her chemistry panel drawn again to see where her sodium was. I told her if she was still feeling dehydrated and not able to eat or drink as much then we could give her fluids then. I explained also that we will discuss on Monday with Dr. Marylou Mccoy what we will do going forward to make sure she doesn't have a week like this every time she gets treatment. She thanked me, and denied further questions at this time.

## 2021-02-25 NOTE — Progress Notes
Patient presented to clinic for ziextenzo injection. Patient tolerated injection well in abdominal tissue. Patient discharged off unit in stable condition.

## 2021-02-27 ENCOUNTER — Encounter: Admit: 2021-02-27 | Discharge: 2021-02-27 | Payer: MEDICARE

## 2021-02-27 DIAGNOSIS — B999 Unspecified infectious disease: Secondary | ICD-10-CM

## 2021-02-27 DIAGNOSIS — F32A Depression: Secondary | ICD-10-CM

## 2021-02-27 DIAGNOSIS — S83209A Unspecified tear of unspecified meniscus, current injury, unspecified knee, initial encounter: Secondary | ICD-10-CM

## 2021-02-27 DIAGNOSIS — C73 Malignant neoplasm of thyroid gland: Secondary | ICD-10-CM

## 2021-02-27 DIAGNOSIS — C801 Malignant (primary) neoplasm, unspecified: Secondary | ICD-10-CM

## 2021-02-27 DIAGNOSIS — M2550 Pain in unspecified joint: Secondary | ICD-10-CM

## 2021-02-27 DIAGNOSIS — C50112 Malignant neoplasm of central portion of left female breast: Secondary | ICD-10-CM

## 2021-02-27 DIAGNOSIS — E079 Disorder of thyroid, unspecified: Secondary | ICD-10-CM

## 2021-02-27 DIAGNOSIS — R011 Cardiac murmur, unspecified: Secondary | ICD-10-CM

## 2021-02-27 DIAGNOSIS — R519 Generalized headaches: Secondary | ICD-10-CM

## 2021-02-27 DIAGNOSIS — M5030 Other cervical disc degeneration, unspecified cervical region: Secondary | ICD-10-CM

## 2021-02-27 DIAGNOSIS — I10 Essential (primary) hypertension: Secondary | ICD-10-CM

## 2021-02-27 DIAGNOSIS — F419 Anxiety disorder, unspecified: Secondary | ICD-10-CM

## 2021-02-27 DIAGNOSIS — M5136 Other intervertebral disc degeneration, lumbar region: Secondary | ICD-10-CM

## 2021-02-27 DIAGNOSIS — M549 Dorsalgia, unspecified: Secondary | ICD-10-CM

## 2021-02-27 DIAGNOSIS — C50919 Malignant neoplasm of unspecified site of unspecified female breast: Secondary | ICD-10-CM

## 2021-02-27 DIAGNOSIS — M4800 Spinal stenosis, site unspecified: Secondary | ICD-10-CM

## 2021-02-27 DIAGNOSIS — M5134 Other intervertebral disc degeneration, thoracic region: Secondary | ICD-10-CM

## 2021-02-27 DIAGNOSIS — D539 Nutritional anemia, unspecified: Secondary | ICD-10-CM

## 2021-02-27 DIAGNOSIS — IMO0002 Ulcer: Secondary | ICD-10-CM

## 2021-02-27 DIAGNOSIS — E039 Hypothyroidism, unspecified: Secondary | ICD-10-CM

## 2021-02-27 LAB — COMPREHENSIVE METABOLIC PANEL
ALBUMIN: 4.6 g/dL (ref 3.5–5.0)
ALK PHOSPHATASE: 64 U/L (ref 25–110)
ALT: 18 U/L (ref 7–56)
ANION GAP: 10 (ref 3–12)
AST: 15 U/L (ref 7–40)
BLD UREA NITROGEN: 20 mg/dL (ref 7–25)
CALCIUM: 9.2 mg/dL (ref 8.5–10.6)
CHLORIDE: 99 MMOL/L (ref 98–110)
CO2: 25 MMOL/L (ref 21–30)
CREATININE: 0.6 mg/dL (ref 0.4–1.00)
EGFR: >60 mL/min (ref >60)
GLUCOSE,PANEL: 80 mg/dL (ref 70–100)
POTASSIUM: 3.5 MMOL/L (ref 3.5–5.1)
SODIUM: 134 MMOL/L — ABNORMAL LOW (ref 137–147)
TOTAL BILIRUBIN: 0.5 mg/dL (ref 0.3–1.2)
TOTAL PROTEIN: 6.6 g/dL (ref 6.0–8.0)

## 2021-02-27 MED ORDER — SODIUM CHLORIDE 0.9 % IV SOLP
1000 mL | Freq: Once | INTRAVENOUS | 0 refills | Status: CP
Start: 2021-02-27 — End: ?
  Administered 2021-02-27: 14:00:00 1000 mL via INTRAVENOUS

## 2021-02-27 MED ORDER — HEPARIN, PORCINE (PF) 100 UNIT/ML IV SYRG
500 [IU] | Freq: Once | 0 refills | Status: CP
Start: 2021-02-27 — End: ?

## 2021-02-27 NOTE — Progress Notes
Patient here for IVF.  Port accessed, labs collected.  Tolerated infusion well.   Port de-accessed per protocol, heparinized.   Discharged in stable condition.

## 2021-02-28 ENCOUNTER — Encounter: Admit: 2021-02-28 | Discharge: 2021-02-28 | Payer: MEDICARE

## 2021-02-28 DIAGNOSIS — C50112 Malignant neoplasm of central portion of left female breast: Secondary | ICD-10-CM

## 2021-02-28 MED ORDER — PROCHLORPERAZINE MALEATE 10 MG PO TAB
ORAL_TABLET | Freq: Four times a day (QID) | 2 refills | PRN
Start: 2021-02-28 — End: ?

## 2021-03-02 ENCOUNTER — Encounter: Admit: 2021-03-02 | Discharge: 2021-03-02 | Payer: MEDICARE

## 2021-03-02 DIAGNOSIS — R519 Generalized headaches: Secondary | ICD-10-CM

## 2021-03-02 DIAGNOSIS — F419 Anxiety disorder, unspecified: Secondary | ICD-10-CM

## 2021-03-02 DIAGNOSIS — C73 Malignant neoplasm of thyroid gland: Secondary | ICD-10-CM

## 2021-03-02 DIAGNOSIS — C50112 Malignant neoplasm of central portion of left female breast: Secondary | ICD-10-CM

## 2021-03-02 DIAGNOSIS — C50919 Malignant neoplasm of unspecified site of unspecified female breast: Secondary | ICD-10-CM

## 2021-03-02 DIAGNOSIS — D539 Nutritional anemia, unspecified: Secondary | ICD-10-CM

## 2021-03-02 DIAGNOSIS — B999 Unspecified infectious disease: Secondary | ICD-10-CM

## 2021-03-02 DIAGNOSIS — M5030 Other cervical disc degeneration, unspecified cervical region: Secondary | ICD-10-CM

## 2021-03-02 DIAGNOSIS — F32A Depression: Secondary | ICD-10-CM

## 2021-03-02 DIAGNOSIS — M5134 Other intervertebral disc degeneration, thoracic region: Secondary | ICD-10-CM

## 2021-03-02 DIAGNOSIS — M5136 Other intervertebral disc degeneration, lumbar region: Secondary | ICD-10-CM

## 2021-03-02 DIAGNOSIS — R011 Cardiac murmur, unspecified: Secondary | ICD-10-CM

## 2021-03-02 DIAGNOSIS — C801 Malignant (primary) neoplasm, unspecified: Secondary | ICD-10-CM

## 2021-03-02 DIAGNOSIS — M4800 Spinal stenosis, site unspecified: Secondary | ICD-10-CM

## 2021-03-02 DIAGNOSIS — M2550 Pain in unspecified joint: Secondary | ICD-10-CM

## 2021-03-02 DIAGNOSIS — M549 Dorsalgia, unspecified: Secondary | ICD-10-CM

## 2021-03-02 DIAGNOSIS — I10 Essential (primary) hypertension: Secondary | ICD-10-CM

## 2021-03-02 DIAGNOSIS — S83209A Unspecified tear of unspecified meniscus, current injury, unspecified knee, initial encounter: Secondary | ICD-10-CM

## 2021-03-02 DIAGNOSIS — E079 Disorder of thyroid, unspecified: Secondary | ICD-10-CM

## 2021-03-02 DIAGNOSIS — E039 Hypothyroidism, unspecified: Secondary | ICD-10-CM

## 2021-03-02 DIAGNOSIS — IMO0002 Ulcer: Secondary | ICD-10-CM

## 2021-03-02 LAB — CBC AND DIFF
ABSOLUTE BASO COUNT: 0 K/UL (ref 0–0.20)
ABSOLUTE EOS COUNT: 0.1 K/UL (ref 0–0.45)
ABSOLUTE LYMPH COUNT: 0.8 K/UL — ABNORMAL LOW (ref 1.0–4.8)
ABSOLUTE MONO COUNT: 0.4 K/UL (ref 0–0.80)
ABSOLUTE NEUTROPHIL: 1.4 K/UL — ABNORMAL LOW (ref 1.8–7.0)
BASOPHILS %: 0 % (ref 0–2)
EOSINOPHILS %: 3 % (ref 0–5)
HEMATOCRIT: 36 % (ref 36–45)
LYMPHOCYTES %: 30 % (ref 24–44)
MCH: 31 pg (ref 26–34)
MCHC: 32 g/dL (ref 32.0–36.0)
MCV: 97 FL (ref 80–100)
MONOCYTES %: 16 % — ABNORMAL HIGH (ref 4–12)
MPV: 8.8 FL (ref 7–11)
NEUTROPHILS %: 51 % (ref 41–77)
PLATELET COUNT: 148 K/UL — ABNORMAL LOW (ref 150–400)
RBC COUNT: 3.7 M/UL — ABNORMAL LOW (ref 4.0–5.0)
RDW: 13 % (ref 11–15)
WBC COUNT: 2.7 K/UL — ABNORMAL LOW (ref 4.5–11.0)

## 2021-03-02 MED ORDER — SODIUM CHLORIDE 0.9 % IV SOLP
1000 mL | Freq: Once | INTRAVENOUS | 0 refills
Start: 2021-03-02 — End: ?

## 2021-03-02 MED ORDER — OLANZAPINE 5 MG PO TBDI
ORAL_TABLET | Freq: Every evening | ORAL | 2 refills | 30.00000 days | Status: AC | PRN
Start: 2021-03-02 — End: ?

## 2021-03-03 ENCOUNTER — Encounter: Admit: 2021-03-03 | Discharge: 2021-03-03 | Payer: MEDICARE

## 2021-03-05 ENCOUNTER — Encounter: Admit: 2021-03-05 | Discharge: 2021-03-05 | Payer: MEDICARE

## 2021-03-05 DIAGNOSIS — B999 Unspecified infectious disease: Secondary | ICD-10-CM

## 2021-03-05 DIAGNOSIS — C73 Malignant neoplasm of thyroid gland: Secondary | ICD-10-CM

## 2021-03-05 DIAGNOSIS — C801 Malignant (primary) neoplasm, unspecified: Secondary | ICD-10-CM

## 2021-03-05 DIAGNOSIS — IMO0002 Ulcer: Secondary | ICD-10-CM

## 2021-03-05 DIAGNOSIS — M2550 Pain in unspecified joint: Secondary | ICD-10-CM

## 2021-03-05 DIAGNOSIS — F32A Depression: Secondary | ICD-10-CM

## 2021-03-05 DIAGNOSIS — R519 Generalized headaches: Secondary | ICD-10-CM

## 2021-03-05 DIAGNOSIS — M5030 Other cervical disc degeneration, unspecified cervical region: Secondary | ICD-10-CM

## 2021-03-05 DIAGNOSIS — M5136 Other intervertebral disc degeneration, lumbar region: Secondary | ICD-10-CM

## 2021-03-05 DIAGNOSIS — F419 Anxiety disorder, unspecified: Secondary | ICD-10-CM

## 2021-03-05 DIAGNOSIS — S83209A Unspecified tear of unspecified meniscus, current injury, unspecified knee, initial encounter: Secondary | ICD-10-CM

## 2021-03-05 DIAGNOSIS — R011 Cardiac murmur, unspecified: Secondary | ICD-10-CM

## 2021-03-05 DIAGNOSIS — D539 Nutritional anemia, unspecified: Secondary | ICD-10-CM

## 2021-03-05 DIAGNOSIS — E079 Disorder of thyroid, unspecified: Secondary | ICD-10-CM

## 2021-03-05 DIAGNOSIS — I10 Essential (primary) hypertension: Secondary | ICD-10-CM

## 2021-03-05 DIAGNOSIS — C50919 Malignant neoplasm of unspecified site of unspecified female breast: Secondary | ICD-10-CM

## 2021-03-05 DIAGNOSIS — M549 Dorsalgia, unspecified: Secondary | ICD-10-CM

## 2021-03-05 DIAGNOSIS — E039 Hypothyroidism, unspecified: Secondary | ICD-10-CM

## 2021-03-05 DIAGNOSIS — M4800 Spinal stenosis, site unspecified: Secondary | ICD-10-CM

## 2021-03-05 DIAGNOSIS — M5134 Other intervertebral disc degeneration, thoracic region: Secondary | ICD-10-CM

## 2021-03-05 NOTE — Progress Notes
NAME: Cindy Randolph    Short MRN: [id number]    DOB: 1961-02-24    [facility]   Genetic Counseling  Date: 03/11/2021     Cindy Randolph, age 60 y.o., was seen in clinic today for a discussion of the results of the genetic testing that was performed during our previous visit. The patient's verbal consent was obtained to provide this telehealth visit due to the [facility].     Genetic Test Results   Testing was ordered for Cindy Randolph based on her personal history of thyroid and breast cancers as well as her family history of cancer. Included within the testing that was performed were genes that are associated with an increased risk for cancer. The testing that was ordered was:     Karna Dupes' CustomNext-Cancer?  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   ? 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 The arginine at codon 844 is replaced by leucine, an amino acid with dissimilar properties. This alteration has been reported in individuals with familial medullary thyroid cancer; however, these individuals also carried another RET mutation, p.V804M Cindy Randolph et al. Exp Clin Endocrinol Diabetes 2000 ;108(2):128-32; Lebeault M et al. Thyroid 2017 12;27(12):1511-1522). This amino acid position is highly conserved in available vertebrate species. In addition, this alteration is predicted to be deleterious by in silico analysis. Since supporting evidence is limited at this time, the clinical significance of this alterationremains unclear.    The test report will be scanned and uploaded under Genetic Test in the patient's Labs tab in their chart following today's appointment.    Discussion   All individual carry DNA changes (variants) and most variants do not increase an individual's risk of cancer or other diseases. Variants of uncertain significance (VUS) are identified when a change in a gene was discovered but the lab is unable to determine at this time if the change is pathogenic (cancer-causing) or benign (normal human variation).     As more individuals are tested and more knowledge accumulates, variants of uncertain significance eventually become reclassified as pathogenic (cancer-causing) or benign (normal human variation). Approximately 90% of variants are reclassified as negative/benign. Cindy Randolph sends providers updates on reclassifications when applicable. Any updates provided to me in the future will be sent to [facility] with an opportunity to discuss in more detail if they would like. Additionally, Cindy Randolph may contact our offices every 1-2 years to inquire about additional updates on the VUS if no information has been sent to them yet.      We do not recommend any changes to Cindy Randolph medical management based on this gene change alone - no clinical action should be taken for a VUS.     As a reminder, the American Cancer Society recommends preventative cancer screening for all individuals, even if their (or a family member's) genetic  testing was negative. These screenings include the following for individuals aged 38-64:  Annual mammograms are recommended for those ages 32-54, with a change to every 2 years starting at age 17 based on individual preference. Breast awareness and personal breast exams are also recommended to keep track of any changes. Cindy Randolph is encouraged to report any breast changes to her primary care physician.   Cervical cancer screening, consisting of a primary HPV test and Pap smear every 5 years, or a Pap smear alone every 3 years. Screening is not needed if the cervix has been surgically removed for a non-cancer related reason.   Colonoscopies to assess colon cancer risk are recommended to begin at 45.   Prostate cancer screening, including a digital rectal exam and/or blood test, should be considered at 50 following a discussion with your primary care physician regarding the benefits and limitations of screening.   For individuals 8 and older, it's recommended to discuss lung cancer screening with your primary care physician based on your smoking history.     Cindy Randolph testing did not find a mutation in any of the other known genes associated with hereditary cancer syndromes. This means that at this time we cannot explain the cause for Cindy Randolph 's cancers, nor her family history of cancer.     We do not recommend that Cindy Randolph unaffected family members seek testing for the VUS that was found in her.   There may still be a hereditary cancer syndrome in Cindy Randolph family that she just did not inherit. Cindy Randolph was the most informative individual in her family to test as she has been diagnosed with cancer. A negative result for her is more reassuring for her family members in that there's a lower chance that her first degree relatives (brothers, parents, children) carry a cancer predisposing mutation. If interested, Cindy Randolph relatives could pursue comprehensive genetic testing as they would meet NCCN criteria for testing by way of family history. If there is a pathogenic (cancer-causing) genetic mutation found in Cindy Randolph family members, that information would greatly reduce Cindy Randolph risk for related cancers due to her testing being negative. If family members would like to pursue genetic testing at [facility], they can make an appointment with our offices at [phone]. If family members live outside [place] or [place], they can find a Patent attorney on the [employer] ([email or web address]).     If Cindy Randolph female relatives are worried about their risk for breast cancer, they may qualify for additional screening. If an individual's lifetime risk for breast cancer is 20% or higher, they are considered to be high risk to develop breast cancer. Recommendations for these women are to alternate breast MRIs and mammograms every 6 months. If they would like to discuss their breast cancer risk and screening, they can make an appointment with the [facility] Cancer Prevention Center at [phone] or to get more information they can visit [email or web address]    It is still possible that Cindy Randolph children could have inherited a hereditary cancer syndrome from their fathers's side of the family. If there is a paternal family history of cancer we recommend they consider seeing a Dentist to review the family history and determine if genetic testing is appropriate. We discussed that testing for hereditary cancer syndromes is recommended for individuals 18 years or older, in order to promote patient autonomy and in recognition that the majority of hereditary cancer syndromes include risks for adult-onset cancers that have no screening or management recommendations in childhood.  Due to the family history of breast cancer and meeting NCCN criteria, Cindy Randolph was offered a referral to the Breast Cancer Prevention clinic. A referral was not placed at this time as Cindy Randolph has established care with her providers.    Plan   1. Cindy Randolph is advised to contact us if there are any new cancer diagnoses in the family and/or in about 2-3 years to see if there may be additional genetic testing or improved testing technologies that have been developed that would be worthwhile for herself or for her family members.  They can also contact us every 1-2 years to inquire about any new information regarding the VUS result if we have not already sent updated information.     Cindy Randolph is encouraged to reach out the [facility] at the [facility] with any questions or concerns following today's visit at [phone].     Time: Direct patient care: 9 minutes.  Indirect patient care: 30 minutes. Discussed test results and implications for family members. Determined who in the family still should consider pursuing genetic testing.      Roma Kayser, MS, [facility] (she/her)  Patent examiner of [facility]  [address] [place], [place]  [address]    Email: [email or web address]  Telephone: [phone]  Fax: [phone]  Appointments: [phone]

## 2021-03-07 ENCOUNTER — Encounter: Admit: 2021-03-07 | Discharge: 2021-03-07 | Payer: MEDICARE

## 2021-03-09 ENCOUNTER — Encounter: Admit: 2021-03-09 | Discharge: 2021-03-09 | Payer: MEDICARE

## 2021-03-09 DIAGNOSIS — C50112 Malignant neoplasm of central portion of left female breast: Secondary | ICD-10-CM

## 2021-03-09 MED ORDER — PALONOSETRON 0.25 MG/5 ML IV SOLN
.25 mg | Freq: Once | INTRAVENOUS | 0 refills | Status: CP
Start: 2021-03-09 — End: ?
  Administered 2021-03-09: 14:00:00 0.25 mg via INTRAVENOUS

## 2021-03-09 MED ORDER — APREPITANT 7.2 MG/ML IV EMUL
130 mg | Freq: Once | INTRAVENOUS | 0 refills | Status: CP
Start: 2021-03-09 — End: ?
  Administered 2021-03-09: 14:00:00 130 mg via INTRAVENOUS

## 2021-03-09 MED ORDER — HEPARIN, PORCINE (PF) 100 UNIT/ML IV SYRG
500 [IU] | Freq: Once | 0 refills | Status: CP
Start: 2021-03-09 — End: ?

## 2021-03-09 MED ORDER — DOXORUBICIN 2 MG/ML IV SOLN
60 mg/m2 | Freq: Once | INTRAVENOUS | 0 refills | Status: CP
Start: 2021-03-09 — End: ?
  Administered 2021-03-09: 15:00:00 100.8 mg via INTRAVENOUS

## 2021-03-09 MED ORDER — ACETAMINOPHEN 325 MG PO TAB
650 mg | Freq: Once | ORAL | 0 refills | Status: CP
Start: 2021-03-09 — End: ?
  Administered 2021-03-09: 16:00:00 650 mg via ORAL

## 2021-03-09 MED ORDER — CYCLOPHOSPHAMIDE IVPB
600 mg/m2 | Freq: Once | INTRAVENOUS | 0 refills | Status: CP
Start: 2021-03-09 — End: ?
  Administered 2021-03-09 (×2): 1000 mg via INTRAVENOUS

## 2021-03-09 MED ORDER — DEXAMETHASONE 6 MG PO TAB
12 mg | Freq: Once | ORAL | 0 refills | Status: CP
Start: 2021-03-09 — End: ?
  Administered 2021-03-09: 14:00:00 12 mg via ORAL

## 2021-03-09 MED ORDER — FAMOTIDINE (PF) 20 MG/2 ML IV SOLN
20 mg | Freq: Once | INTRAVENOUS | 0 refills | Status: CP
Start: 2021-03-09 — End: ?
  Administered 2021-03-09: 14:00:00 20 mg via INTRAVENOUS

## 2021-03-09 NOTE — Progress Notes
CHEMO NOTE  Verified chemo consent signed and in chart.    Blood return positive via: Port (Single)    BSA and dose double checked (agree with orders as written) with: yes, see EMAR.    Labs/applicable tests checked: CBC and Comprehensive Metabolic Panel (CMP)    Chemo regimen: Day 1, Cycle 2; Adriamycin and Cytoxan.    Rate verified and armband double check with second RN: yes    Patient education offered and stated understanding. Denies questions at this time.      Pt arrived for treatment. Port was accessed, flushed with good blood return. Port labs obtained. Pt seen by provider and ok to treat. Pre-medication was given. Then Adriamycin and Cytoxan was given. Pt experienced a headache towards the end of Cytoxan infusion and was given Tylenol. Pt tolerated rest of treatment well without other issues. Pt denied any questions. Port was flushed, heparinized and de-accessed. Pt discharged ambulatory.

## 2021-03-10 ENCOUNTER — Encounter: Admit: 2021-03-10 | Discharge: 2021-03-10 | Payer: MEDICARE

## 2021-03-10 DIAGNOSIS — M2550 Pain in unspecified joint: Secondary | ICD-10-CM

## 2021-03-10 DIAGNOSIS — M5136 Other intervertebral disc degeneration, lumbar region: Secondary | ICD-10-CM

## 2021-03-10 DIAGNOSIS — IMO0002 Ulcer: Secondary | ICD-10-CM

## 2021-03-10 DIAGNOSIS — M5030 Other cervical disc degeneration, unspecified cervical region: Secondary | ICD-10-CM

## 2021-03-10 DIAGNOSIS — C50919 Malignant neoplasm of unspecified site of unspecified female breast: Secondary | ICD-10-CM

## 2021-03-10 DIAGNOSIS — R519 Generalized headaches: Secondary | ICD-10-CM

## 2021-03-10 DIAGNOSIS — E039 Hypothyroidism, unspecified: Secondary | ICD-10-CM

## 2021-03-10 DIAGNOSIS — C73 Malignant neoplasm of thyroid gland: Secondary | ICD-10-CM

## 2021-03-10 DIAGNOSIS — R011 Cardiac murmur, unspecified: Secondary | ICD-10-CM

## 2021-03-10 DIAGNOSIS — I10 Essential (primary) hypertension: Secondary | ICD-10-CM

## 2021-03-10 DIAGNOSIS — D539 Nutritional anemia, unspecified: Secondary | ICD-10-CM

## 2021-03-10 DIAGNOSIS — C50112 Malignant neoplasm of central portion of left female breast: Secondary | ICD-10-CM

## 2021-03-10 DIAGNOSIS — F32A Depression: Secondary | ICD-10-CM

## 2021-03-10 DIAGNOSIS — M549 Dorsalgia, unspecified: Secondary | ICD-10-CM

## 2021-03-10 DIAGNOSIS — B999 Unspecified infectious disease: Secondary | ICD-10-CM

## 2021-03-10 DIAGNOSIS — F419 Anxiety disorder, unspecified: Secondary | ICD-10-CM

## 2021-03-10 DIAGNOSIS — M5134 Other intervertebral disc degeneration, thoracic region: Secondary | ICD-10-CM

## 2021-03-10 DIAGNOSIS — E079 Disorder of thyroid, unspecified: Secondary | ICD-10-CM

## 2021-03-10 DIAGNOSIS — C801 Malignant (primary) neoplasm, unspecified: Secondary | ICD-10-CM

## 2021-03-10 DIAGNOSIS — M4800 Spinal stenosis, site unspecified: Secondary | ICD-10-CM

## 2021-03-10 DIAGNOSIS — S83209A Unspecified tear of unspecified meniscus, current injury, unspecified knee, initial encounter: Secondary | ICD-10-CM

## 2021-03-10 MED ORDER — PEGFILGRASTIM-BMEZ 6 MG/0.6 ML SC SYRG
6 mg | Freq: Once | SUBCUTANEOUS | 0 refills | Status: CP
Start: 2021-03-10 — End: ?
  Administered 2021-03-10: 18:00:00 6 mg via SUBCUTANEOUS

## 2021-03-10 NOTE — Progress Notes
Ziextenzo; Day 2, Cycle 2    Pt arrived for injection and IVF. Pt denied any need for IVF and stated she feels good and has been drinking plenty of water. Her only complaint is being a little tired. Ziextenzo injection given and tolerated without issues. Pt denied any questions or concerns and was discharged ambulatory in stable condition.

## 2021-03-11 ENCOUNTER — Encounter: Admit: 2021-03-11 | Discharge: 2021-03-11 | Payer: MEDICARE

## 2021-03-11 DIAGNOSIS — Z85850 Personal history of malignant neoplasm of thyroid: Secondary | ICD-10-CM

## 2021-03-11 DIAGNOSIS — C50112 Malignant neoplasm of central portion of left female breast: Secondary | ICD-10-CM

## 2021-03-11 DIAGNOSIS — Z803 Family history of malignant neoplasm of breast: Secondary | ICD-10-CM

## 2021-03-11 MED ORDER — LORAZEPAM 0.5 MG PO TAB
.5 mg | ORAL_TABLET | ORAL | 0 refills | 12.00000 days | Status: AC | PRN
Start: 2021-03-11 — End: ?

## 2021-03-16 ENCOUNTER — Encounter: Admit: 2021-03-16 | Discharge: 2021-03-16 | Payer: MEDICARE

## 2021-03-16 ENCOUNTER — Ambulatory Visit: Admit: 2021-03-16 | Discharge: 2021-03-16 | Payer: MEDICARE

## 2021-03-16 DIAGNOSIS — C801 Malignant (primary) neoplasm, unspecified: Secondary | ICD-10-CM

## 2021-03-16 DIAGNOSIS — C73 Malignant neoplasm of thyroid gland: Secondary | ICD-10-CM

## 2021-03-16 DIAGNOSIS — M5134 Other intervertebral disc degeneration, thoracic region: Secondary | ICD-10-CM

## 2021-03-16 DIAGNOSIS — M4800 Spinal stenosis, site unspecified: Secondary | ICD-10-CM

## 2021-03-16 DIAGNOSIS — M5136 Other intervertebral disc degeneration, lumbar region: Secondary | ICD-10-CM

## 2021-03-16 DIAGNOSIS — E039 Hypothyroidism, unspecified: Secondary | ICD-10-CM

## 2021-03-16 DIAGNOSIS — M5030 Other cervical disc degeneration, unspecified cervical region: Secondary | ICD-10-CM

## 2021-03-16 DIAGNOSIS — F419 Anxiety disorder, unspecified: Secondary | ICD-10-CM

## 2021-03-16 DIAGNOSIS — Z9889 Other specified postprocedural states: Secondary | ICD-10-CM

## 2021-03-16 DIAGNOSIS — E079 Disorder of thyroid, unspecified: Secondary | ICD-10-CM

## 2021-03-16 DIAGNOSIS — C50112 Malignant neoplasm of central portion of left female breast: Secondary | ICD-10-CM

## 2021-03-16 DIAGNOSIS — S83209A Unspecified tear of unspecified meniscus, current injury, unspecified knee, initial encounter: Secondary | ICD-10-CM

## 2021-03-16 DIAGNOSIS — K649 Unspecified hemorrhoids: Secondary | ICD-10-CM

## 2021-03-16 DIAGNOSIS — M2550 Pain in unspecified joint: Secondary | ICD-10-CM

## 2021-03-16 DIAGNOSIS — D539 Nutritional anemia, unspecified: Secondary | ICD-10-CM

## 2021-03-16 DIAGNOSIS — I10 Essential (primary) hypertension: Secondary | ICD-10-CM

## 2021-03-16 DIAGNOSIS — F32A Depression: Secondary | ICD-10-CM

## 2021-03-16 DIAGNOSIS — C50919 Malignant neoplasm of unspecified site of unspecified female breast: Secondary | ICD-10-CM

## 2021-03-16 DIAGNOSIS — R011 Cardiac murmur, unspecified: Secondary | ICD-10-CM

## 2021-03-16 DIAGNOSIS — M549 Dorsalgia, unspecified: Secondary | ICD-10-CM

## 2021-03-16 DIAGNOSIS — R519 Generalized headaches: Secondary | ICD-10-CM

## 2021-03-16 DIAGNOSIS — IMO0002 Ulcer: Secondary | ICD-10-CM

## 2021-03-16 DIAGNOSIS — B999 Unspecified infectious disease: Secondary | ICD-10-CM

## 2021-03-16 DIAGNOSIS — Z85850 Personal history of malignant neoplasm of thyroid: Secondary | ICD-10-CM

## 2021-03-16 NOTE — Patient Instructions
Please do not hesitate to contact my office with any questions.    Dr. Bryan Vopat & Stephanie Caldwell PA-C - Orthopedic Surgeon, Sports Medicine  [facility] - Phone [phone] - Fax [phone]   [address], [address] - [place], [place] [address]    To schedule an appointment, please call our scheduling line at [phone]    Maeve Debord BSN, RN - Clinical Nurse Coordinator  Casey Conover BSN, RN - Clinical Nurse Coordinator  Stephanie Adamson ATC, LAT - Clinical Athletic Trainer    Thank you for supporting our practice! Your feedback helps us deliver the highest quality of care.  Review us at: [email or web address]

## 2021-03-19 ENCOUNTER — Encounter: Admit: 2021-03-19 | Discharge: 2021-03-19 | Payer: MEDICARE

## 2021-03-19 NOTE — Telephone Encounter
New Patient Appointment with Colorectal Surgery (CRS)  Date: Tues (03/31/21)  Time: 9:30AM  CRS Provider Name (full name): Deberah Pelton, APRN    Clinic Location [facility] or [facility] w/address): [employer], [address] [place], [address], [place], [place]  [address]    Verbal directions to appointment given on (date): 10.27.22    Patient has MyChart Access (yes, no w/reason or date instructions sent to pt): yes      Other Appt/Tests Coordinated with this Appt (n/a or test name w/date & time): n/a    Need Hoyer Lift (yes or n/a. For disabled patients.): n/a    Engineer, technical sales (n/a, Language or Language & ID # if used for this appt): n/a    Patient expressed understanding of below:   -Check-in 30 min early (yes/no): yes   -Wait List (yes, n/a or pt declined): Pt declined  -No-Show/Late Cancellation Policy (yes, or no w/reason): yes  The surgeons ask that we let all patients know of the NS/LC Policy here at [facility] for all appointments: If you need to cancel/reschedule, please try and let us know as far in advance as possible. If you cancel after 12PM the day before the appointment, it is considered a NS/LC. When you accumulate NS/LC, it gives the provider the option to review and possibly decline future appointments.   -Insurance Referral Authorization ([employer]are, [place] CCN, [employer], Designer, multimedia White)/Self-Pay (n/a, Self-Pay or Auth # & date range): n/a    CRS Provider Preference (any or name of provider(s)): Any    Referring Provider (name & specialty): Dr. Morene Antu (MED ONC)  Care Team Updated on (date): 10.27.22    Diagnosis: hemorrhoid while on chemotherapy, h/o breast cancer    Pt replied:    No - recent physical exam  Yes - script from PCP  No - soaking in bath/sitz bath instructions  Yes - diet reviewed  Pt understands PCP to follow and assist with managing symptoms until seen by a specialist, and advocate as needed.     Verbal records history from (pt or name of family member, caregiver, etc.): patient    Continuation of Care    GI Lab Testing/Procedure Reports with any Associated Pathology (FROM LAST 10 YEARS):   Colonoscopy, EGD, Flexible Sigmoidoscopy, Anorectal Manometry, Capsule Endoscopy, Endoscopic Ultrasound   Patient GI Testing History (Approx year, name of test & facility name, or provider name as needed. Or document, Pt with no memory of provider/facility name.):     approx 10 yrs ago - colonoscopy at [facility], [place], [place]    approx 5 yrs ago - EGD and colonoscopy at [facility], [place], [place]    approx 2 yrs ago - EGD at [facility], [place]    Operative Report, Surgical Pathology & Discharge Summary (FROM LAST 10 YEARS):   Abdominal & Pelvic Surgeries, Colon, Rectal & Anal Surgeries (Please also note lifetime history.)  Patient Abdominal/Pelvic Surgery History (Approx year, non-clinical name of surgery & facility name, or provider name as needed. Or document, Pt with no memory of provider/facility name.):     No hx    Radiology Imaging Reports & Cloud Images (FROM LAST 5 YEARS):    CT c/a/p, MRI a/p, MRE, Xray: Abdomen (KUB), Pelvis, Acute Abdominal Series, Small Bowel Follow Thru, PET Scan: Full Body or Skull to Thighs. Colon Single Contrast &/or MRI Defecography, Barium/Gastrographin Enemas, Sitz Marker Study  Patient Radiology History (List name of Hospital(s)/Facilities(s) only):     [facility], [place],  [place]     Pelvic Floor Physical Therapy Progress Notes or Biofeedback Testing/Pelvic EMG Testing (FROM LAST 5 YEARS).  Patient Physical Therapy History (n/a or list timeframe w/facility name. Common diagnoses w/PT: constipation, fecal incontinence, pelvic floor dysfunction, rectocele & rectal prolapse): n/a    Gastroenterology Office Visit Notes/Progress Notes (FROM LAST 5 YEARS).  Patient Office Visit Notes/Progress Notes History: CNC/RN to review and request as needed.     Emailed Records/Referral from Physician Consult to [email or web address] (scan into O2) and corresponding CNC/RN (n/a or RN name w/date): n/a

## 2021-03-20 ENCOUNTER — Encounter: Admit: 2021-03-20 | Discharge: 2021-03-20 | Payer: MEDICARE

## 2021-03-23 ENCOUNTER — Encounter: Admit: 2021-03-23 | Discharge: 2021-03-23 | Payer: MEDICARE

## 2021-03-23 DIAGNOSIS — C73 Malignant neoplasm of thyroid gland: Secondary | ICD-10-CM

## 2021-03-23 DIAGNOSIS — R011 Cardiac murmur, unspecified: Secondary | ICD-10-CM

## 2021-03-23 DIAGNOSIS — C50919 Malignant neoplasm of unspecified site of unspecified female breast: Secondary | ICD-10-CM

## 2021-03-23 DIAGNOSIS — C50112 Malignant neoplasm of central portion of left female breast: Secondary | ICD-10-CM

## 2021-03-23 DIAGNOSIS — M549 Dorsalgia, unspecified: Secondary | ICD-10-CM

## 2021-03-23 DIAGNOSIS — F32A Depression: Secondary | ICD-10-CM

## 2021-03-23 DIAGNOSIS — M2550 Pain in unspecified joint: Secondary | ICD-10-CM

## 2021-03-23 DIAGNOSIS — M5030 Other cervical disc degeneration, unspecified cervical region: Secondary | ICD-10-CM

## 2021-03-23 DIAGNOSIS — IMO0002 Ulcer: Secondary | ICD-10-CM

## 2021-03-23 DIAGNOSIS — B999 Unspecified infectious disease: Secondary | ICD-10-CM

## 2021-03-23 DIAGNOSIS — D539 Nutritional anemia, unspecified: Secondary | ICD-10-CM

## 2021-03-23 DIAGNOSIS — E039 Hypothyroidism, unspecified: Secondary | ICD-10-CM

## 2021-03-23 DIAGNOSIS — I10 Essential (primary) hypertension: Secondary | ICD-10-CM

## 2021-03-23 DIAGNOSIS — E079 Disorder of thyroid, unspecified: Secondary | ICD-10-CM

## 2021-03-23 DIAGNOSIS — M5136 Other intervertebral disc degeneration, lumbar region: Secondary | ICD-10-CM

## 2021-03-23 DIAGNOSIS — M4800 Spinal stenosis, site unspecified: Secondary | ICD-10-CM

## 2021-03-23 DIAGNOSIS — F419 Anxiety disorder, unspecified: Secondary | ICD-10-CM

## 2021-03-23 DIAGNOSIS — R519 Generalized headaches: Secondary | ICD-10-CM

## 2021-03-23 DIAGNOSIS — S83209A Unspecified tear of unspecified meniscus, current injury, unspecified knee, initial encounter: Secondary | ICD-10-CM

## 2021-03-23 DIAGNOSIS — M5134 Other intervertebral disc degeneration, thoracic region: Secondary | ICD-10-CM

## 2021-03-23 DIAGNOSIS — C801 Malignant (primary) neoplasm, unspecified: Secondary | ICD-10-CM

## 2021-03-23 MED ORDER — HEPARIN, PORCINE (PF) 100 UNIT/ML IV SYRG
500 [IU] | Freq: Once | 0 refills | Status: CP
Start: 2021-03-23 — End: ?

## 2021-03-23 MED ORDER — DEXAMETHASONE 6 MG PO TAB
12 mg | Freq: Once | ORAL | 0 refills | Status: CP
Start: 2021-03-23 — End: ?
  Administered 2021-03-23: 15:00:00 12 mg via ORAL

## 2021-03-23 MED ORDER — APREPITANT 7.2 MG/ML IV EMUL
130 mg | Freq: Once | INTRAVENOUS | 0 refills | Status: CP
Start: 2021-03-23 — End: ?
  Administered 2021-03-23: 15:00:00 130 mg via INTRAVENOUS

## 2021-03-23 MED ORDER — SODIUM CHLORIDE 0.9 % IV SOLP
500 mL | Freq: Once | INTRAVENOUS | 0 refills | Status: CP
Start: 2021-03-23 — End: ?
  Administered 2021-03-23: 16:00:00 500 mL via INTRAVENOUS

## 2021-03-23 MED ORDER — CYCLOPHOSPHAMIDE IVPB
600 mg/m2 | Freq: Once | INTRAVENOUS | 0 refills | Status: CP
Start: 2021-03-23 — End: ?
  Administered 2021-03-23 (×2): 1000 mg via INTRAVENOUS

## 2021-03-23 MED ORDER — FAMOTIDINE (PF) 20 MG/2 ML IV SOLN
20 mg | Freq: Once | INTRAVENOUS | 0 refills | Status: CP
Start: 2021-03-23 — End: ?
  Administered 2021-03-23: 15:00:00 20 mg via INTRAVENOUS

## 2021-03-23 MED ORDER — DOXORUBICIN 2 MG/ML IV SOLN
60 mg/m2 | Freq: Once | INTRAVENOUS | 0 refills | Status: CP
Start: 2021-03-23 — End: ?
  Administered 2021-03-23: 16:00:00 100.8 mg via INTRAVENOUS

## 2021-03-23 MED ORDER — ACETAMINOPHEN 500 MG PO TAB
500 mg | ORAL | 0 refills | Status: AC | PRN
Start: 2021-03-23 — End: ?
  Administered 2021-03-23: 17:00:00 500 mg via ORAL

## 2021-03-23 MED ORDER — PALONOSETRON 0.25 MG/5 ML IV SOLN
.25 mg | Freq: Once | INTRAVENOUS | 0 refills | Status: CP
Start: 2021-03-23 — End: ?
  Administered 2021-03-23: 15:00:00 0.25 mg via INTRAVENOUS

## 2021-03-23 NOTE — Progress Notes
Pt in tx for port access/labs and scheduled chemotherapy. Port accessed without difficulties. Labs drawn per order.   Lab results reviewed, pt seen by C.Rosiland Oz APP  today, ok to treat. Pt requesting for IV fluid, pt been travelling so feels symptoms of dehydration, C. Schmidt APP ordered to give 500 NS, infused over 1 hour.   Pt c/o sinus headache at the completion of cytoxan infusion.   Tylenol 500mg  po given per Benard Halsted APP's order.         Premeds given per plan.     CHEMO NOTE    Verified chemo consent signed and in chart.    Verified initiate chemo order in O2    Blood return positive via: Port (single)    BSA and dose double checked (agree with orders as written): Yes    Labs/applicable tests checked: cbc, cmp    Chemo regime: cycle 3 day 1 AC.    Rate verified and armband double checkwith second RN: see EMAR    Patient education offered and stated understanding. Denies questions at this time.      Port flushed, heparinized and de-accessed. Pt tolerated the infusion well, left clinic in good condition.

## 2021-03-23 NOTE — Progress Notes
PATIENT NOTIFIED 03/23/21@1 :58 ON CELL REGARDING TELEHEALTH TEACH TAXOL---RGL

## 2021-03-23 NOTE — Progress Notes
Name: Cindy Randolph          MRN: [id number]      DOB: 04/26/1961      AGE: 60 y.o.   DATE OF SERVICE: 03/23/2021    Subjective:             Reason for Visit:  Follow Up      Cindy Randolph is a 60 y.o. female.     Cancer Staging  Malignant neoplasm of left breast in female, estrogen receptor positive (HCC)  Staging form: Breast, AJCC 8th Edition  - Clinical stage from 12/22/2020: Stage IA (cT1c, cN0, cM0, G1, ER+, PR+, HER2-) - Signed by Massie Kluver, MD on 12/22/2020  - Pathologic stage from 02/02/2021: Stage IB (pT3, pN2(sn), cM0, G1, ER+, PR+, HER2-) - Signed by Nigel Berthold, PA-C on 02/02/2021      History of Present Illness    Cindy Randolph is a patient of Dr. Shaune Pascal with the following oncology history:    She noted intermittent left breast discomfort over several months.      -- A routine screening mammogram on 11/25/2020 at [facility] revealed scattered fibroglandular tissue density. Benign appearing microcalcifications.  Unchanged right upper central anterior breast low-density circumscribed masses dating back to 2013.  Focal asymmetry located within the left posterior central breast, 12 o'clock position, 7.3 cm from the nipple.    -- Left diagnostic mammogram 12/04/2020 revealed irregular density at the 12:00 left breast persisted.    -- Left breast ultrasound on 12/04/2020 revealed ill-defined area which was slightly hypoechoic at the 12 o'clock position, 7 cm from nipple.  This was indeterminate and biopsy was recommended.    -- On 12/10/2020 an ultrasound-guided biopsy revealed a grade 1 invasive lobular carcinoma ER 91 to 100%, PR 11 to 20%, HER2 1+, Ki-67 of 2 to 5% at the 12 o'clock position.    -- MRI breast 12/26/20 MRI: ?RIGH T- benignLEFT - ?multiple faint masses/NME extending in upper and inner quadrant - 6.4cm extent; extends to base of nipple; small level I nodes.    -- She underwent bilateral mastectomy on  01/22/21 by Dr. Joya San by Dr. Dayton Bailiff.  Pathology: LEFT mastectomy ? ?ILC, grade I; ?multifocal with connection/merge together - total extent 7.4cm; LCIS; margins clear; 5/5 nodes Profile - ER 96%; PR 10%; HER 2 negative, KI 67 3%. RIGHT mastectomy - benign .    -- Staging CT CAP and bone scan on 02/10/21 showed no obvious metastatic disease.    -- Started dd AC on 02/23/21.    Interval Events:  Cindy Randolph presents to clinic for her next cycle of chemotherapy. She is with her husband. They went to [place] this weekend and had a very relaxing time. She is eating and drinking well though feels dehydrated after her trip home yesterday. She would like fluids today if possible. Struggling with her bowels. She has been using docusate and then had mushy stool. Hemorrhoids are irritated and bleeding. She is using 2% hydrocortisone for them and it is working. Nausea was better controlled after her most recent cycle of treatment. She uses olanzapine and zofran. She has compazine to use on the days that she doesn't take the olanzapine. No fevers. She holds her lisinopril on the days she takes her olanzapine because when she takes them both she gets orthostatic hypotension.     Review of Systems   Constitutional: Negative for fatigue.   HENT: Positive for rhinorrhea (on and off) and sore throat (on  and off).    Eyes: Positive for discharge (dryer).   Respiratory: Negative for cough and shortness of breath.    Gastrointestinal: Positive for abdominal distention, abdominal pain (bubbly discomfort), constipation, nausea (week 1) and rectal pain (has appointment to see colorectal surgeons).   Musculoskeletal: Positive for arthralgias, back pain, gait problem, joint swelling, myalgias and neck pain.   Skin: Positive for color change (? more freckles).   Neurological: Positive for dizziness, speech difficulty, weakness (fatigue) and headaches.   Psychiatric/Behavioral: Positive for sleep disturbance.         Objective:         ? acetaminophen (TYLENOL) 325 mg tablet Take two tablets by mouth every 6 hours. Take scheduled for 3 days after surgery, then as needed. Do not exceed 4,000mg  in a 24 hour period.   ? cholecalciferol (vitamin D3) (VITAMIN D3 PO) Take  by mouth.   ? dexAMETHasone (DECADRON) 4 mg tablet Take two tablets by mouth daily. On Days 2-4 of each cycle.   ? diazePAM (VALIUM) 2 mg tablet Take one tablet by mouth every 6 hours as needed for Anxiety.   ? diclofenac sodium DR (VOLTAREN) 50 mg tablet Take 50 mg by mouth twice daily. Take with food.   ? Lactobacillus acidophilus (PROBIOTIC) 10 billion cell cap Take  by mouth daily.   ? levothyroxine (SYNTHROID) 100 mcg tablet Take 112 mcg by mouth daily 30 minutes before breakfast.   ? lidocaine/prilocaine (EMLA) 2.5/2.5 % topical cream Apply a thin film over port one hour prior to treatment. Do not rub in. Cover with plastic wrap after application   ? lisinopriL (ZESTRIL) 2.5 mg tablet Take 2.5 mg by mouth twice daily.   ? LORazepam (ATIVAN) 0.5 mg tablet Take one tablet by mouth every 6 hours as needed for Nausea.   ? OLANZapine (ZYPREXA ZYDIS) 5 mg rapid dissolve tablet Take nightly Days 1-4 of each chemo cycle, then nightly as needed for nausea   ? omega 3-dha-epa-fish oil (FISH OIL) 100-160-1,000 mg cap Take  by mouth three times weekly.   ? ondansetron HCL (ZOFRAN) 8 mg tablet Take one tablet by mouth every 8 hours as needed (nausea and vomiting).   ? prochlorperazine maleate (COMPAZINE) 10 mg tablet TAKE 1 TABLET BY MOUTH EVERY 6 HOURS AS NEEDED FOR NAUSEA OR VOMITING   ? traMADoL (ULTRAM) 50 mg tablet Take one tablet by mouth every 8 hours as needed for Pain. Indications: pain   ? traMADoL (ULTRAM) 50 mg tablet Take 50 mg by mouth every 8 hours as needed for Pain. Takes as little as 12.5 mg tramadol for pain.   ? vitamins, B complex tab Take 1 tablet by mouth three times weekly.   ? vitamins, multiple cap Take 1 capsule by mouth daily.     Vitals:    03/23/21 0900 03/23/21 0902   BP: (!) 138/100    BP Source: Arm, Right Upper Arm, Right Upper   Pulse: 92    Temp: 36.8 ?C (98.2 ?F)    Resp: 16    SpO2: 97%    O2 Device:  None (Room air)   TempSrc: Oral Oral   PainSc: Zero Zero   Weight: 64.7 kg (142 lb 9.6 oz) 64.7 kg (142 lb 9.6 oz)   Height: 158.8 cm (5' 2.52) 158.8 cm (5' 2.52)     Body mass index is 25.65 kg/m?Marland Kitchen     Pain Score: Zero       Fatigue Scale: 4  Pain Addressed:  N/A    Patient Evaluated for a Clinical Trial: Patient not eligible for a treatment trial (including not needing treatment, needs palliative care, in remission).     [place] Cooperative Oncology Group performance status is 0, Fully active, able to carry on all pre-disease performance without restriction.Marland Kitchen     Physical Exam  Constitutional:       Appearance: Normal appearance.   HENT:      Head: Normocephalic and atraumatic.      Comments: Alopecia      Mouth/Throat:      Mouth: Mucous membranes are moist.      Pharynx: Oropharynx is clear. No oropharyngeal exudate.   Eyes:      General: No scleral icterus.  Cardiovascular:      Rate and Rhythm: Normal rate and regular rhythm.      Heart sounds: Normal heart sounds.   Pulmonary:      Effort: Pulmonary effort is normal.      Breath sounds: Normal breath sounds.   Abdominal:      General: Bowel sounds are normal.      Palpations: Abdomen is soft.   Musculoskeletal:      Right lower leg: No edema.      Left lower leg: No edema.   Skin:     General: Skin is warm and dry.      Findings: No rash.      Comments: Left chest port accessed and intact without erythema   Neurological:      Mental Status: She is alert and oriented to person, place, and time.          CBC w diff  CBC with Diff Latest Ref Rng & Units 03/23/2021 03/09/2021 03/02/2021 02/23/2021 02/13/2021   WBC 4.5 - 11.0 K/UL 8.6 9.1 2.7(L) 6.0 5.6   RBC 4.0 - 5.0 M/UL 3.32(L) 3.64(L) 3.73(L) 3.57(L) 3.85(L)   HGB 12.0 - 15.0 GM/DL 10.5(L) 11.6(L) 11.8(L) 11.3(L) 12.3   HCT 36 - 45 % 31.8(L) 34.6(L) 36.2 34.0(L) 36.3   MCV 80 - 100 FL 95.7 95.1 97.0 95.2 94.3   MCH 26 - 34 PG 31.5 31.8 31.6 31.6 31.9   MCHC 32.0 - 36.0 G/DL 04.5 40.9 81.1 91.4 78.2   RDW 11 - 15 % 14.4 13.9 13.6 13.7 14.2   PLT 150 - 400 K/UL 163 168 148(L) 178 197   MPV 7 - 11 FL 7.8 8.4 8.8 8.5 8.7   NEUT 41 - 77 % - 72 51 60 59   ANC 1.8 - 7.0 K/UL - 6.60 1.40(L) 3.60 3.30   LYMA 24 - 44 % - 16(L) 30 25 27    ALYM 1.0 - 4.8 K/UL - 1.50 0.80(L) 1.50 1.50   MONA 4 - 12 % - 10 16(H) 12 10   AMONO 0 - 0.80 K/UL - 0.90(H) 0.40 0.70 0.60   EOSA 0 - 5 % - 1 3 2 3    AEOS 0 - 0.45 K/UL - 0.10 0.10 0.10 0.20   BASA 0 - 2 % - 1 0 1 1   ABAS 0 - 0.20 K/UL - 0.00 0.00 0.10 0.10     Comprehensive Metabolic Profile  CMP Latest Ref Rng & Units 03/16/2021 03/09/2021 02/27/2021 02/23/2021 02/13/2021   NA 137 - 147 MMOL/L 136(L) 139 134(L) 131(L) 135(L)   K 3.5 - 5.1 MMOL/L 3.8 4.3 3.5 4.2 4.1   CL 98 - 110 MMOL/L 100 101 99 95(L) 97(L)   CO2  21 - 30 MMOL/L 27 27 25 26 27    GAP 3 - 12 9 11 10 10 11    BUN 7 - 25 MG/DL 15 14 20 16 9    CR 0.4 - 1.00 MG/DL 4.54 0.98 1.19 1.47 8.29   GLUX 70 - 100 MG/DL 562(Z) 80 80 79 84   CA 8.5 - 10.6 MG/DL 9.4 9.4 9.2 8.9 9.2   TP 6.0 - 8.0 G/DL 6.7 6.6 6.6 6.5 6.9   ALB 3.5 - 5.0 G/DL 4.5 4.3 4.6 4.5 4.7   ALKP 25 - 110 U/L 101 74 64 42 46   ALT 7 - 56 U/L 80(H) 48 18 13 18    TBILI 0.3 - 1.2 MG/DL 3.0(Q) 6.5(H) 0.5 0.4 0.6             Assessment and Plan:    Invasive lobular carcinoma, ER, PR positive, HER-2 negative diagnosed on 12/10/20. She underwent bilateral mastectomy and had 5/5 nodes involved. Plan to proceed with adjuvant chemotherapy with dd AC followed by taxol.    -- AC started on 02/23/21. Proceed with Cycle 3 on 03/23/21.    Constipation: We discussed adding MiraLax as she might have more results than with docusate. She is going to try 1/2 dose TIW and see how effective this is. She will see the Colorectal team next week and they may have more recommendations.    Hemorrhoids: Using 2% hydrocortisone topically. Will see Colorectal Surgery for evaluation and recommendations.    FEN/Renal: CMP on 03/16/21 showed creatinine of 0.71 and transaminases within normal range. Drinking well though feels dehydrated. Will give NS 500 ml after treatment. CMP pending 03/23/21.     Anemia: Grade 1 anemia with a hemoglobin of 10.5 g/dl. Repeat in two weeks.       RTC: 2 weeks for MD visit, labs and treatment. Call sooner for questions or concerns.

## 2021-03-24 ENCOUNTER — Encounter: Admit: 2021-03-24 | Discharge: 2021-03-24 | Payer: MEDICARE

## 2021-03-24 DIAGNOSIS — C73 Malignant neoplasm of thyroid gland: Secondary | ICD-10-CM

## 2021-03-24 DIAGNOSIS — F419 Anxiety disorder, unspecified: Secondary | ICD-10-CM

## 2021-03-24 DIAGNOSIS — M4800 Spinal stenosis, site unspecified: Secondary | ICD-10-CM

## 2021-03-24 DIAGNOSIS — M5030 Other cervical disc degeneration, unspecified cervical region: Secondary | ICD-10-CM

## 2021-03-24 DIAGNOSIS — IMO0002 Ulcer: Secondary | ICD-10-CM

## 2021-03-24 DIAGNOSIS — R519 Generalized headaches: Secondary | ICD-10-CM

## 2021-03-24 DIAGNOSIS — M5134 Other intervertebral disc degeneration, thoracic region: Secondary | ICD-10-CM

## 2021-03-24 DIAGNOSIS — E039 Hypothyroidism, unspecified: Secondary | ICD-10-CM

## 2021-03-24 DIAGNOSIS — C801 Malignant (primary) neoplasm, unspecified: Secondary | ICD-10-CM

## 2021-03-24 DIAGNOSIS — M5136 Other intervertebral disc degeneration, lumbar region: Secondary | ICD-10-CM

## 2021-03-24 DIAGNOSIS — F32A Depression: Secondary | ICD-10-CM

## 2021-03-24 DIAGNOSIS — D539 Nutritional anemia, unspecified: Secondary | ICD-10-CM

## 2021-03-24 DIAGNOSIS — M549 Dorsalgia, unspecified: Secondary | ICD-10-CM

## 2021-03-24 DIAGNOSIS — C50112 Malignant neoplasm of central portion of left female breast: Secondary | ICD-10-CM

## 2021-03-24 DIAGNOSIS — E079 Disorder of thyroid, unspecified: Secondary | ICD-10-CM

## 2021-03-24 DIAGNOSIS — S83209A Unspecified tear of unspecified meniscus, current injury, unspecified knee, initial encounter: Secondary | ICD-10-CM

## 2021-03-24 DIAGNOSIS — C50919 Malignant neoplasm of unspecified site of unspecified female breast: Secondary | ICD-10-CM

## 2021-03-24 DIAGNOSIS — R011 Cardiac murmur, unspecified: Secondary | ICD-10-CM

## 2021-03-24 DIAGNOSIS — I10 Essential (primary) hypertension: Secondary | ICD-10-CM

## 2021-03-24 DIAGNOSIS — M2550 Pain in unspecified joint: Secondary | ICD-10-CM

## 2021-03-24 DIAGNOSIS — B999 Unspecified infectious disease: Secondary | ICD-10-CM

## 2021-03-24 MED ORDER — PEGFILGRASTIM-BMEZ 6 MG/0.6 ML SC SYRG
6 mg | Freq: Once | SUBCUTANEOUS | 0 refills | Status: CP
Start: 2021-03-24 — End: ?
  Administered 2021-03-24: 21:00:00 6 mg via SUBCUTANEOUS

## 2021-03-24 MED ORDER — FAMOTIDINE 20 MG PO TAB
20 mg | Freq: Two times a day (BID) | ORAL | 0 refills | Status: DC
Start: 2021-03-24 — End: 2021-03-24

## 2021-03-24 MED ORDER — FAMOTIDINE 20 MG PO TAB
20 mg | Freq: Once | ORAL | 0 refills | Status: CP
Start: 2021-03-24 — End: ?
  Administered 2021-03-24: 21:00:00 20 mg via ORAL

## 2021-03-24 NOTE — Progress Notes
Cindy Randolph; Day 2, Cycle 3    Patient arrived for an injection. Cindy Randolph was given and tolerated without issues. Patient requests some pepcid as she is having issues with acid reflux and forgot to bring medications with her today. Pepcid was ordered and given. Patient denied any further issues and was discharged ambulatory.

## 2021-03-26 NOTE — Progress Notes
..  This prechart is intended to be a reference for patient appointments. Information is gathered from in chart as well as external records review. Information will be clarified/verified/updated in final documentation in the office visit.     Pre Clinic Pre Chart:    Problem List:  Hemorrhoid  Breast Cancer    03/16/2021 - Med Onc:  I,ve used the Prep H, baths and witch Hazel wipes, but the external hemorrhoid continues, and is frustrating. Ive dealt with a much smaller, infrequent problem since my last couple of pregnancies but the increased irregularity since Chemo, and more irritated state  of the intestine means Im chasing constipation vs very soft stool. Im normally much more regular. It is not strangulated--that did happen once a decade ago and Prep H and sitz baths fixed it. But this is just stubborn. Is there a prescription ointment to switch to that Dr. Marylou Mccoy could prescribe? Im past the recommended usage time of PrepH.  To the good- Ive only needed Zofran or Compazine one a day, if that. Nausea is certainly better these last few days! Re: diet, I eat fruits and vegetables and some whole grains and legumes (little to no white flour/sugar) so I do get natural soluble and insoluble fiber.  Metamucil and or Miralax which used as directed when indicated have proved overrated in results (and make one more crampy).  This really is tiring and I could use one less very annoying consequence of Chemo.  I'm seeing Dr. Alba Cory today at [facility] for a scheduled F/U for my right knee. It still has swelling, & is sore at times. Dealing with so much. But again, in general, its much better than the first cycle.   I did shave my head when the hair came off in handfuls this week and Im dealing well with that aspect of Chemo--at least! Getting walks and stationary bike sessions in!    2017 - Colonoscopy:

## 2021-03-31 ENCOUNTER — Encounter: Admit: 2021-03-31 | Discharge: 2021-03-31 | Payer: MEDICARE

## 2021-03-31 DIAGNOSIS — K602 Anal fissure, unspecified: Secondary | ICD-10-CM

## 2021-03-31 DIAGNOSIS — R519 Generalized headaches: Secondary | ICD-10-CM

## 2021-03-31 DIAGNOSIS — S83209A Unspecified tear of unspecified meniscus, current injury, unspecified knee, initial encounter: Secondary | ICD-10-CM

## 2021-03-31 DIAGNOSIS — C801 Malignant (primary) neoplasm, unspecified: Secondary | ICD-10-CM

## 2021-03-31 DIAGNOSIS — M4800 Spinal stenosis, site unspecified: Secondary | ICD-10-CM

## 2021-03-31 DIAGNOSIS — I10 Essential (primary) hypertension: Secondary | ICD-10-CM

## 2021-03-31 DIAGNOSIS — E039 Hypothyroidism, unspecified: Secondary | ICD-10-CM

## 2021-03-31 DIAGNOSIS — F32A Depression: Secondary | ICD-10-CM

## 2021-03-31 DIAGNOSIS — M5136 Other intervertebral disc degeneration, lumbar region: Secondary | ICD-10-CM

## 2021-03-31 DIAGNOSIS — M5030 Other cervical disc degeneration, unspecified cervical region: Secondary | ICD-10-CM

## 2021-03-31 DIAGNOSIS — M5134 Other intervertebral disc degeneration, thoracic region: Secondary | ICD-10-CM

## 2021-03-31 DIAGNOSIS — F419 Anxiety disorder, unspecified: Secondary | ICD-10-CM

## 2021-03-31 DIAGNOSIS — B999 Unspecified infectious disease: Secondary | ICD-10-CM

## 2021-03-31 DIAGNOSIS — E079 Disorder of thyroid, unspecified: Secondary | ICD-10-CM

## 2021-03-31 DIAGNOSIS — C73 Malignant neoplasm of thyroid gland: Secondary | ICD-10-CM

## 2021-03-31 DIAGNOSIS — M549 Dorsalgia, unspecified: Secondary | ICD-10-CM

## 2021-03-31 DIAGNOSIS — IMO0002 Ulcer: Secondary | ICD-10-CM

## 2021-03-31 DIAGNOSIS — R011 Cardiac murmur, unspecified: Secondary | ICD-10-CM

## 2021-03-31 DIAGNOSIS — C50919 Malignant neoplasm of unspecified site of unspecified female breast: Secondary | ICD-10-CM

## 2021-03-31 DIAGNOSIS — M2550 Pain in unspecified joint: Secondary | ICD-10-CM

## 2021-03-31 DIAGNOSIS — D539 Nutritional anemia, unspecified: Secondary | ICD-10-CM

## 2021-03-31 MED ORDER — NIFEDIPINE 0.3%/LIDOCAINE 1.5%/WHITE PETROLATUM(#) 100G
Freq: Four times a day (QID) | TOPICAL | 0 refills | 30.00000 days | Status: AC
Start: 2021-03-31 — End: ?

## 2021-03-31 NOTE — Progress Notes
Name: Cindy Randolph          MRN: [id number]      DOB: [birth year]      AGE: 60 y.o.   DATE OF SERVICE: 03/31/2021    Subjective:             Reason for Visit:  New Patient      Cindy Randolph is a 60 y.o. female.     Cancer Staging  Malignant neoplasm of left breast in female, estrogen receptor positive (HCC)  Staging form: Breast, AJCC 8th Edition  - Clinical stage from 12/22/2020: Stage IA (cT1c, cN0, cM0, G1, ER+, PR+, HER2-) - Signed by Massie Kluver, MD on 12/22/2020  - Pathologic stage from 02/02/2021: Stage IB (pT3, pN2(sn), cM0, G1, ER+, PR+, HER2-) - Signed by Nigel Berthold, PA-C on 02/02/2021      History of Present Illness  Cindy Randolph is a 60 y.o. female. with a past medical history of hemorrhoids and breast cancer. She presents today with complaints of pain and bleeding of the rectum. Patient has a history of eight vaginal births and has had a history of hemorrhoids - including a strangulated hemorrhoid. She has currently on chemo for the last six weeks on [facility] and has had constipation since. When she passes the hard stools she has severe anal pain. She reports prolapsing hemorrhoids and was pushing the hemorrhoids back inside of the rectum when they first occurred, but stopped doing this when they would continue to pop out after a bowel movement. She reports bleeding with bowel movements; blood is on the toilet paper after wiping. She also reports itching; but it is not constant. She has been using witch hazel with some relief. She denies use of suppositories. She is having some issues with cleaning herself when there is diarrhea. She has been trying to take OTC medications to combat the constipation/diarrhea; which has led to back and forth constipation and diarrhea. . She has used supplemental and oral fiber, stool softeners, and miralax in the past without much relief. She is not consistent with her bowel regime because she feels overwhelmed with recommendations and is unsure of what will actually help with her symptoms. . She was having normal soft bowel movements before chemo once a day. She is now having pebble like bowel movements every morning since on chemo, and it is very painful. She does report having to push to get her stool out occasionally because it feels stuck, but denies anal splinting. She reports an uncomfortable feeling all the time and bloating, is able to pass gas. Denies nausea and vomiting. She was prescribed Hydrocortisone 2% topical ointment to apply to her rectum by her PCP, and has not seen any relief with this medication. Her last colonoscopy was performed in 2017 which was negative. She denies family history of colon or rectal cancer. Denies abdominal pain or unintentional weight loss. No prior procedures on hemorrhoids before.     03/16/2021 - Med Onc:  I,ve used the Prep H, baths and witch Hazel wipes, but the external hemorrhoid continues, and is frustrating. I?ve dealt with a much smaller, infrequent problem since my last couple of pregnancies but the increased irregularity since Chemo, and more irritated state  of the intestine means I?m chasing constipation vs very soft stool. I?m normally much more regular. It is not strangulated--that did happen once a decade ago and Prep H and sitz baths fixed it. But this is just stubborn. Is  there a prescription ointment to switch to that Dr. Neil Crouch could prescribe? I?m past the recommended usage time of PrepH.  To the good- I?ve only needed Zofran or Compazine one a day, if that. Nausea is certainly better these last few days! Re: diet, I eat fruits and vegetables and some whole grains and legumes (little to no white flour/sugar) so I do get natural soluble and insoluble fiber.  Metamucil and or Miralax which used as directed when indicated have proved overrated in results (and make one more crampy).  This really is tiring and I could use one less very annoying consequence of Chemo.  I'm seeing Dr. Essie Christine today at [facility] for a scheduled F/U for my right knee. It still has swelling, & is sore at times. Dealing with so much. But again, in general, it?s much better than the first cycle.   I did shave my head when the hair came off in handfuls this week and I?m dealing well with that aspect of Chemo--at least! Getting walks and stationary bike sessions in!    2017 - Colonoscopy:  Marland Kitchen  Medical History:   Diagnosis Date   ? Acquired hypothyroidism    ? Anxiety 2016    from pain   ? Back pain    ? Breast cancer in female [facility]) 12/2020    Left   ? Cancer of thyroid (HCC)    ? Degenerative disc disease, cervical 1994   ? Degenerative disc disease, lumbar 2010   ? Degenerative disc disease, thoracic 2010   ? Depression situational, transient    after divorce, after death of 2nd husband   ? Essential hypertension began in 3rd trimester most pregnancies    remained after last pregnancy   ? Essential hypertension, benign as above   ? Generalized headaches 1996-2010    probably hormonally related   ? Heart murmur at birth    Dr. Maisie Fus said he didn't hear it a few years ago   ? Infection    ? Joint pain 1994   ? Other malignant neoplasm without specification of site thyroid, 1980    thyroidectomy   ? Spinal stenosis 2017 to present    per MRIs   ? Thyroid disorder as noted   ? Torn meniscus 12/25/2020   ? Ulcer 1987    with divorce; resolved   ? Unspecified deficiency anemia 2009    from excessive bleeding post-partum; treated iron     Surgical History:   Procedure Laterality Date   ? HX WRIST FRACTURE SURGERY  1993    Baker's thumb with fixation   ? ARTHROPLASTY  2001    L ACL   ? COLONOSCOPY  2017    normal   ? ARTHROSCOPY KNEE WITH PARTIAL LATERAL MENISCECTOMY AND LEFT KNEE INJECTION. Right 01/08/2021    Performed by Vopat, Lowry Ram, MD at [facility] OR   ? BILATERAL TOTAL MASTECTOMIES Bilateral 01/22/2021    Performed by Massie Kluver, MD at [facility] OR   ? INTRAOPERATIVE SENTINEL LYMPH NODE IDENTIFICATION WITH/ WITHOUT NON-RADIOACTIVE DYE INJECTION Left 01/22/2021    Performed by Massie Kluver, MD at [facility] OR   ? INJECTION RADIOACTIVE TRACER FOR SENTINEL NODE IDENTIFICATION Left 01/22/2021    Performed by Massie Kluver, MD at [facility]   ? LEFT AXILLARY SENTINEL LYMPH NODE BIOPSY Left 01/22/2021    Performed by Massie Kluver, MD at [facility]   ? BILATERAL CHEST FLAT CLOSURE Bilateral 01/22/2021    Performed  by Erlene Quan, Zachary George, MD at [facility] OR   ? BILATERAL CHEST FLAT CLOSURE x 8 Bilateral 01/22/2021    Performed by Stevenson Clinch, MD at [facility] OR   ? EXCISION BENIGN LESION 0.5 CM OR LESS - TORSO Right 01/22/2021    Performed by Erlene Quan, Zachary George, MD at [facility] OR   ? Placement of port-a-cath - 39F Right 02/19/2021    Performed by Freund, Alecia Lemming., MD at [facility] OR   ? FLUOROSCOPIC GUIDANCE CENTRAL VENOUS ACCESS DEVICE PLACEMENT/ REPLACEMENT/ REMOVAL N/A 02/19/2021    Performed by Flo Shanks, Alecia Lemming., MD at [facility] OR   ? KNEE SURGERY  L ACL as above   ? THYROIDECTOMY       Family History   Problem Relation Age of Onset   ? Arthritis Paternal Grandmother         (osteoarthritis) multiple heberdens noduls and deformities   ? Back pain Paternal Grandmother    ? Arthritis Mother         wear and tear   ? Back pain Mother    ? Hypertension Mother    ? Joint Pain Mother    ? Neck Pain Mother    ? Cancer-Breast Mother 71        was on HRT for 10-15 years   ? Basal Cell Carcinoma Brother    ? Back pain Brother    ? Hypertension Brother    ? Basal Cell Carcinoma Brother    ? Back pain Brother         has had surgery   ? Hypertension Brother    ? Basal Cell Carcinoma Brother    ? Back pain Brother    ? Hypertension Brother    ? Diabetes Father         Type 2   ? Heart problem Father         CABG 3   ? Heart Disease Father    ? Hypertension Father    ? Diabetes Maternal Grandfather         Type 2   ? Birth Defect Daughter         PRS, congenital diaphragmatic hernia, malrotation, grey matter heterotopia   ? Stroke Maternal Uncle    ? Thyroid Disease Maternal Uncle    ? Diabetes Paternal Grandfather    ? Birth Defect Nephew         craniosynostosis   ? Cancer-Breast Maternal Great-Aunt 15     Social History     Socioeconomic History   ? Marital status: Married   Occupational History   ? Occupation: retired Engineer, civil (consulting)   Tobacco Use   ? Smoking status: Never Smoker   ? Smokeless tobacco: Never Used   Vaping Use   ? Vaping Use: Never used   Substance and Sexual Activity   ? Alcohol use: Never     Comment: one per month beer   ? Drug use: Never     Comment: in late teens, early twenties, a few times and not since   ? Sexual activity: Yes     Partners: Male     Birth control/protection: Post-menopausal, None     Vaping/E-liquid Use   ? Vaping Use Never User         CBD   ? Product Type Oil    ? Route Oral    ? Current Frequency Daily    ? Indication Pain    ?  Indication Comments Stopped 3 weeks ago              Review of Systems   Constitutional: Negative.  Negative for chills, fever and unexpected weight change.   HENT: Negative.    Eyes: Negative.    Respiratory: Negative.    Cardiovascular: Negative.    Gastrointestinal: Positive for abdominal distention, anal bleeding, constipation, diarrhea and rectal pain. Negative for blood in stool, nausea and vomiting.   Endocrine: Negative.    Genitourinary: Negative.    Musculoskeletal: Negative.    Skin: Negative.  Negative for color change and wound.   Allergic/Immunologic: Negative.    Neurological: Negative.  Negative for weakness and light-headedness.   Hematological: Negative.    Psychiatric/Behavioral: Negative.          Objective:         ? acetaminophen (TYLENOL) 325 mg tablet Take two tablets by mouth every 6 hours. Take scheduled for 3 days after surgery, then as needed. Do not exceed 4,000mg  in a 24 hour period.   ? cholecalciferol (vitamin D3) (VITAMIN D3 PO) Take  by mouth.   ? dexAMETHasone (DECADRON) 4 mg tablet Take two tablets by mouth daily. On Days 2-4 of each cycle.   ? diazePAM (VALIUM) 2 mg tablet Take one tablet by mouth every 6 hours as needed for Anxiety.   ? diclofenac sodium DR (VOLTAREN) 50 mg tablet Take 50 mg by mouth twice daily. Take with food.   ? hydrocortisone (ANUSOL-HC) 2.5 % rectal cream APPLY RECTALLY TO THE AFFECTED AREA 2 TO 4 TIMES PER DAY AS NEEDED FOR HEMORRHOIDS   ? Lactobacillus acidophilus (PROBIOTIC) 10 billion cell cap Take  by mouth daily.   ? levothyroxine (SYNTHROID) 100 mcg tablet Take 112 mcg by mouth daily 30 minutes before breakfast.   ? lidocaine/prilocaine (EMLA) 2.5/2.5 % topical cream Apply a thin film over port one hour prior to treatment. Do not rub in. Cover with plastic wrap after application   ? lisinopriL (ZESTRIL) 2.5 mg tablet Take 2.5 mg by mouth twice daily.   ? LORazepam (ATIVAN) 0.5 mg tablet Take one tablet by mouth every 6 hours as needed for Nausea.   ? OLANZapine (ZYPREXA ZYDIS) 5 mg rapid dissolve tablet Take nightly Days 1-4 of each chemo cycle, then nightly as needed for nausea   ? omega 3-dha-epa-fish oil (FISH OIL) 100-160-1,000 mg cap Take  by mouth three times weekly.   ? ondansetron HCL (ZOFRAN) 8 mg tablet Take one tablet by mouth every 8 hours as needed (nausea and vomiting).   ? prochlorperazine maleate (COMPAZINE) 10 mg tablet TAKE 1 TABLET BY MOUTH EVERY 6 HOURS AS NEEDED FOR NAUSEA OR VOMITING   ? traMADoL (ULTRAM) 50 mg tablet Take one tablet by mouth every 8 hours as needed for Pain. Indications: pain   ? traMADoL (ULTRAM) 50 mg tablet Take 50 mg by mouth every 8 hours as needed for Pain. Takes as little as 12.5 mg tramadol for pain.   ? vitamins, B complex tab Take 1 tablet by mouth three times weekly.   ? vitamins, multiple cap Take 1 capsule by mouth daily.     Vitals:    03/31/21 0926   BP: 117/86   BP Source: Arm, Right Upper   Pulse: 82   Temp: 36.6 ?C (97.8 ?F)   Resp: 16   SpO2: 100%   PainSc: Four     There is no height or weight on file to calculate BMI.  Pain Score: Four  Pain Loc: Rectum    Fatigue Scale: 2 Physical Exam  Vitals reviewed. Exam conducted with a chaperone present.   Constitutional:       Appearance: Normal appearance.   HENT:      Head: Normocephalic and atraumatic.   Cardiovascular:      Rate and Rhythm: Normal rate and regular rhythm.   Pulmonary:      Effort: Pulmonary effort is normal. No respiratory distress.   Genitourinary:     Comments: External: Anterior anal fissure. Old external hemorrhoids.   Musculoskeletal:         General: Normal range of motion.      Cervical back: Normal range of motion.   Neurological:      Mental Status: She is alert.   Psychiatric:         Mood and Affect: Mood normal.         Behavior: Behavior normal.         Thought Content: Thought content normal.         Judgment: Judgment normal.               Assessment and Plan:  60yo female with breast cancer on chemotherapy with anterior anal fissure.     Anal Fissure:  - We discussed that an anal fissure is a tear in the epithelial lining of the anal canal. These can cause severe anal pain, bright red anal bleeding, and anal spasm.  - For treatment of acute anal fissure we will initiate conservative measures which include increasing fiber and hydration, along with laxative and stool softeners to avoid constipation. The patient can use sitz baths and topical lidocaine as needed.  - We had a thorough discussion on fiber consumption and how to maximize it. Cindy Randolph was instructed on dietary fiber totals and how to increase this. She also understands that the patient can use supplemental fiber through over the counter citrocel, metamucil, or benefiber. I advise 1 scoop of powdered fiber daily for one week and then increase to 2 scoops on week two. Avoid fiber pills or gummies.  She also knows to maximize water consumption with eight 8 ounce glasses per day. A handout was provided.   - Will prescribe topical nifedipine 0.3%/lidocaine compounded ointment which will be used three to four times a day to the outer anal opening. The mechanism of action of nifedipine and its use in anal fissures was discussed. These measures can be used for up to 12weeks.   - Prescription sent to [facility]. The processing time and cost were discussed with the patient.   - If the patient has not seen significant improvement we discussed the next steps which include Botox injection to the anal sphincter. Lateral internal sphincterotomy is an additional option if the patient fails conservative measures and anal Botox.   - Recommend patient start taking one colace daily for two weeks.    Rectocele:  - Educated patient on what a rectocele is and what causes them.  - Discussed with patient that this is most likely caused by her multiple vaginal deliveries.  - Discussed that there is nothing that needs to be done at this point.   - Educated that priority is to get bowel regime stable and to treat anal fissue.    Plan:  - RTC 6wks     The patient and family were allowed to ask questions and voice concerns; these were addressed to the best of our ability. They  expressed understanding of what was explained to them, and they agreed with the present plan. Patient has the phone numbers for the [facility] and was instructed on how to contact us with any questions or concerns.    My collaborating provider on this patient is Dr. Hassell Done MD.    Fabio Asa. Kae Heller MSN, APRN, AGCNS-BC, AGPCNP-BC  Advanced Practice Provider  Colon and Rectal Surgery  Surgical Oncology  APP for Dr. Cena Benton, Dr. Hassell Done and Dr. Bethanie Dicker  Pager: [phone]  Available via [place] and [place]

## 2021-04-06 ENCOUNTER — Encounter: Admit: 2021-04-06 | Discharge: 2021-04-06 | Payer: MEDICARE

## 2021-04-06 DIAGNOSIS — IMO0002 Ulcer: Secondary | ICD-10-CM

## 2021-04-06 DIAGNOSIS — M5134 Other intervertebral disc degeneration, thoracic region: Secondary | ICD-10-CM

## 2021-04-06 DIAGNOSIS — D539 Nutritional anemia, unspecified: Secondary | ICD-10-CM

## 2021-04-06 DIAGNOSIS — C801 Malignant (primary) neoplasm, unspecified: Secondary | ICD-10-CM

## 2021-04-06 DIAGNOSIS — F419 Anxiety disorder, unspecified: Secondary | ICD-10-CM

## 2021-04-06 DIAGNOSIS — M549 Dorsalgia, unspecified: Secondary | ICD-10-CM

## 2021-04-06 DIAGNOSIS — E079 Disorder of thyroid, unspecified: Secondary | ICD-10-CM

## 2021-04-06 DIAGNOSIS — C73 Malignant neoplasm of thyroid gland: Secondary | ICD-10-CM

## 2021-04-06 DIAGNOSIS — I10 Essential (primary) hypertension: Secondary | ICD-10-CM

## 2021-04-06 DIAGNOSIS — R519 Generalized headaches: Secondary | ICD-10-CM

## 2021-04-06 DIAGNOSIS — F32A Depression: Secondary | ICD-10-CM

## 2021-04-06 DIAGNOSIS — M5136 Other intervertebral disc degeneration, lumbar region: Secondary | ICD-10-CM

## 2021-04-06 DIAGNOSIS — M4800 Spinal stenosis, site unspecified: Secondary | ICD-10-CM

## 2021-04-06 DIAGNOSIS — S83209A Unspecified tear of unspecified meniscus, current injury, unspecified knee, initial encounter: Secondary | ICD-10-CM

## 2021-04-06 DIAGNOSIS — R011 Cardiac murmur, unspecified: Secondary | ICD-10-CM

## 2021-04-06 DIAGNOSIS — C50112 Malignant neoplasm of central portion of left female breast: Secondary | ICD-10-CM

## 2021-04-06 DIAGNOSIS — E039 Hypothyroidism, unspecified: Secondary | ICD-10-CM

## 2021-04-06 DIAGNOSIS — M2550 Pain in unspecified joint: Secondary | ICD-10-CM

## 2021-04-06 DIAGNOSIS — C50919 Malignant neoplasm of unspecified site of unspecified female breast: Secondary | ICD-10-CM

## 2021-04-06 DIAGNOSIS — M5030 Other cervical disc degeneration, unspecified cervical region: Secondary | ICD-10-CM

## 2021-04-06 DIAGNOSIS — B999 Unspecified infectious disease: Secondary | ICD-10-CM

## 2021-04-06 MED ORDER — FAMOTIDINE (PF) 20 MG/2 ML IV SOLN
20 mg | Freq: Once | INTRAVENOUS | 0 refills | Status: CP
Start: 2021-04-06 — End: ?
  Administered 2021-04-06: 16:00:00 20 mg via INTRAVENOUS

## 2021-04-06 MED ORDER — APREPITANT 7.2 MG/ML IV EMUL
130 mg | Freq: Once | INTRAVENOUS | 0 refills | Status: CP
Start: 2021-04-06 — End: ?
  Administered 2021-04-06: 16:00:00 130 mg via INTRAVENOUS

## 2021-04-06 MED ORDER — CYCLOPHOSPHAMIDE IVPB
600 mg/m2 | Freq: Once | INTRAVENOUS | 0 refills | Status: CP
Start: 2021-04-06 — End: ?
  Administered 2021-04-06 (×2): 1000 mg via INTRAVENOUS

## 2021-04-06 MED ORDER — PALONOSETRON 0.25 MG/5 ML IV SOLN
.25 mg | Freq: Once | INTRAVENOUS | 0 refills | Status: CP
Start: 2021-04-06 — End: ?
  Administered 2021-04-06: 16:00:00 0.25 mg via INTRAVENOUS

## 2021-04-06 MED ORDER — DEXAMETHASONE 6 MG PO TAB
12 mg | Freq: Once | ORAL | 0 refills | Status: CP
Start: 2021-04-06 — End: ?
  Administered 2021-04-06: 16:00:00 12 mg via ORAL

## 2021-04-06 MED ORDER — DOXORUBICIN 2 MG/ML IV SOLN
60 mg/m2 | Freq: Once | INTRAVENOUS | 0 refills | Status: CP
Start: 2021-04-06 — End: ?
  Administered 2021-04-06: 16:00:00 100.8 mg via INTRAVENOUS

## 2021-04-06 MED ORDER — HEPARIN, PORCINE (PF) 100 UNIT/ML IV SYRG
500 [IU] | Freq: Once | 0 refills | Status: CP
Start: 2021-04-06 — End: ?

## 2021-04-06 NOTE — Progress Notes
Pt in tx for port access/labs and scheduled chemotherapy. Port accessed without difficulties. Labs drawn per order.   Lab results reviewed, pt seen by Dr. Marylou Mccoy  today, ok to treat. Pt c/o nausea and ongoing issues with constipation, pt  Voiced understanding of the treatment regimen.     Premeds given per plan.     CHEMO NOTE  Verified chemo consent signed and in chart.    Verified initiate chemo order in [facility]    Blood return positive via: Port     BSA and dose double checked (agree with orders as written) : Yes    Labs/applicable tests checked: cbc, cmp, EF    Chemo regime: AC cycle 4 day 1     Rate verified and armband double checkwith second RN: See EMAR    Patient education offered and stated understanding. Denies questions at this time.    Port flushed, heparinized and de-accessed. Pt tolerated the infusion well, left clinic in good condition.

## 2021-04-07 ENCOUNTER — Encounter: Admit: 2021-04-07 | Discharge: 2021-04-07 | Payer: MEDICARE

## 2021-04-07 DIAGNOSIS — R519 Generalized headaches: Secondary | ICD-10-CM

## 2021-04-07 DIAGNOSIS — C73 Malignant neoplasm of thyroid gland: Secondary | ICD-10-CM

## 2021-04-07 DIAGNOSIS — M4800 Spinal stenosis, site unspecified: Secondary | ICD-10-CM

## 2021-04-07 DIAGNOSIS — M5134 Other intervertebral disc degeneration, thoracic region: Secondary | ICD-10-CM

## 2021-04-07 DIAGNOSIS — M5030 Other cervical disc degeneration, unspecified cervical region: Secondary | ICD-10-CM

## 2021-04-07 DIAGNOSIS — IMO0002 Ulcer: Secondary | ICD-10-CM

## 2021-04-07 DIAGNOSIS — F419 Anxiety disorder, unspecified: Secondary | ICD-10-CM

## 2021-04-07 DIAGNOSIS — F32A Depression: Secondary | ICD-10-CM

## 2021-04-07 DIAGNOSIS — C801 Malignant (primary) neoplasm, unspecified: Secondary | ICD-10-CM

## 2021-04-07 DIAGNOSIS — C50112 Malignant neoplasm of central portion of left female breast: Secondary | ICD-10-CM

## 2021-04-07 DIAGNOSIS — E039 Hypothyroidism, unspecified: Secondary | ICD-10-CM

## 2021-04-07 DIAGNOSIS — E079 Disorder of thyroid, unspecified: Secondary | ICD-10-CM

## 2021-04-07 DIAGNOSIS — R011 Cardiac murmur, unspecified: Secondary | ICD-10-CM

## 2021-04-07 DIAGNOSIS — C50919 Malignant neoplasm of unspecified site of unspecified female breast: Secondary | ICD-10-CM

## 2021-04-07 DIAGNOSIS — D539 Nutritional anemia, unspecified: Secondary | ICD-10-CM

## 2021-04-07 DIAGNOSIS — M549 Dorsalgia, unspecified: Secondary | ICD-10-CM

## 2021-04-07 DIAGNOSIS — I10 Essential (primary) hypertension: Secondary | ICD-10-CM

## 2021-04-07 DIAGNOSIS — M5136 Other intervertebral disc degeneration, lumbar region: Secondary | ICD-10-CM

## 2021-04-07 DIAGNOSIS — B999 Unspecified infectious disease: Secondary | ICD-10-CM

## 2021-04-07 DIAGNOSIS — M2550 Pain in unspecified joint: Secondary | ICD-10-CM

## 2021-04-07 DIAGNOSIS — S83209A Unspecified tear of unspecified meniscus, current injury, unspecified knee, initial encounter: Secondary | ICD-10-CM

## 2021-04-07 MED ORDER — PEGFILGRASTIM-BMEZ 6 MG/0.6 ML SC SYRG
6 mg | Freq: Once | SUBCUTANEOUS | 0 refills | Status: CP
Start: 2021-04-07 — End: ?
  Administered 2021-04-07: 19:00:00 6 mg via SUBCUTANEOUS

## 2021-04-07 NOTE — Progress Notes
Cindy Randolph; Day 2, Cycle 4    Pt arrived for injection. Cindy Randolph injection given in abdominal tissue without issues. Pt denied any questions or concerns and was discharged ambulatory in stable condition.

## 2021-04-08 ENCOUNTER — Encounter: Admit: 2021-04-08 | Discharge: 2021-04-08 | Payer: MEDICARE

## 2021-04-09 ENCOUNTER — Encounter: Admit: 2021-04-09 | Discharge: 2021-04-09 | Payer: MEDICARE

## 2021-04-09 DIAGNOSIS — C50112 Malignant neoplasm of central portion of left female breast: Secondary | ICD-10-CM

## 2021-04-09 MED ORDER — DEXAMETHASONE 4 MG PO TAB
8 mg | ORAL_TABLET | Freq: Every day | ORAL | 0 refills | 12.50000 days | Status: AC
Start: 2021-04-09 — End: ?

## 2021-04-13 ENCOUNTER — Encounter: Admit: 2021-04-13 | Discharge: 2021-04-13 | Payer: MEDICARE

## 2021-04-13 DIAGNOSIS — C50112 Malignant neoplasm of central portion of left female breast: Secondary | ICD-10-CM

## 2021-04-13 MED ORDER — FAMOTIDINE (PF) 20 MG/2 ML IV SOLN
20 mg | Freq: Once | INTRAVENOUS | 0 refills
Start: 2021-04-13 — End: ?

## 2021-04-13 MED ORDER — DEXAMETHASONE SODIUM PHOSPHATE 10 MG/ML IJ SOLN
10 mg | Freq: Once | INTRAVENOUS | 0 refills
Start: 2021-04-13 — End: ?

## 2021-04-13 MED ORDER — DIPHENHYDRAMINE HCL 50 MG/ML IJ SOLN
50 mg | Freq: Once | INTRAVENOUS | 0 refills
Start: 2021-04-13 — End: ?

## 2021-04-13 MED ORDER — PACLITAXEL 250ML IVPB
175 mg/m2 | Freq: Once | INTRAVENOUS | 0 refills
Start: 2021-04-13 — End: ?

## 2021-04-13 MED ORDER — PEGFILGRASTIM-BMEZ 6 MG/0.6 ML SC SYRG
6 mg | Freq: Once | SUBCUTANEOUS | 0 refills
Start: 2021-04-13 — End: ?

## 2021-04-13 NOTE — Progress Notes
Name: Cindy Randolph          MRN: [id number]      DOB: [birth year]      AGE: 60 y.o.   DATE OF SERVICE: 04/13/2021    Subjective:             Reason for Visit:  No chief complaint on file.      Cindy Randolph is a 60 y.o. female.     Cancer Staging  Malignant neoplasm of left breast in female, estrogen receptor positive (HCC)  Staging form: Breast, AJCC 8th Edition  - Clinical stage from 12/22/2020: Stage IA (cT1c, cN0, cM0, G1, ER+, PR+, HER2-) - Signed by Massie Kluver, MD on 12/22/2020  - Pathologic stage from 02/02/2021: Stage IB (pT3, pN2(sn), cM0, G1, ER+, PR+, HER2-) - Signed by Nigel Berthold, PA-C on 02/02/2021      Obtained patient's verbal consent to treat them and their agreement to [facility] financial policy and NPP via this telehealth visit during the [facility]    History of Present Illness    Cindy Randolph is a patient of Dr. Shaune Pascal with the following oncology history:    She noted intermittent left breast discomfort over several months.      -- A routine screening mammogram on 11/25/2020 at [facility] revealed scattered fibroglandular tissue density. Benign appearing microcalcifications.  Unchanged right upper central anterior breast low-density circumscribed masses dating back to 2013.  Focal asymmetry located within the left posterior central breast, 12 o'clock position, 7.3 cm from the nipple.    -- Left diagnostic mammogram 12/04/2020 revealed irregular density at the 12:00 left breast persisted.    -- Left breast ultrasound on 12/04/2020 revealed ill-defined area which was slightly hypoechoic at the 12 o'clock position, 7 cm from nipple.  This was indeterminate and biopsy was recommended.    -- On 12/10/2020 an ultrasound-guided biopsy revealed a grade 1 invasive lobular carcinoma ER 91 to 100%, PR 11 to 20%, HER2 1+, Ki-67 of 2 to 5% at the 12 o'clock position.    -- MRI breast 12/26/20 MRI: ?RIGH T- benignLEFT - ?multiple faint masses/NME extending in upper and inner quadrant - 6.4cm extent; extends to base of nipple; small level I nodes.    -- She underwent bilateral mastectomy on  01/22/21 by Dr. Joya San by Dr. Dayton Bailiff.  Pathology: LEFT mastectomy ? ?ILC, grade I; ?multifocal with connection/merge together - total extent 7.4cm; LCIS; margins clear; 5/5 nodes Profile - ER 96%; PR 10%; HER 2 negative, KI 67 3%. RIGHT mastectomy - benign .    -- Staging CT CAP and bone scan on 02/10/21 showed no obvious metastatic disease.    -- Started dd AC on 02/23/21.  Dose 4 given on 04/06/2021.    -- Plan to start taxol on 04/20/21.    Interval Events:  Cindy Randolph presents for education about her treatment with paclitaxel.  She has had a rough week since receiving her last cycle of AC.     Review of Systems      Objective:         ? acetaminophen (TYLENOL) 325 mg tablet Take two tablets by mouth every 6 hours. Take scheduled for 3 days after surgery, then as needed. Do not exceed 4,000mg  in a 24 hour period.   ? cholecalciferol (vitamin D3) (VITAMIN D3 PO) Take  by mouth.   ? dexAMETHasone (DECADRON) 4 mg tablet Take two tablets by mouth daily. On Days 2-4 of each  cycle.   ? diazePAM (VALIUM) 2 mg tablet Take one tablet by mouth every 6 hours as needed for Anxiety.   ? diclofenac sodium DR (VOLTAREN) 50 mg tablet Take 50 mg by mouth twice daily. Take with food.   ? hydrocortisone (ANUSOL-HC) 2.5 % rectal cream APPLY RECTALLY TO THE AFFECTED AREA 2 TO 4 TIMES PER DAY AS NEEDED FOR HEMORRHOIDS   ? Lactobacillus acidophilus (PROBIOTIC) 10 billion cell cap Take  by mouth daily.   ? levothyroxine (SYNTHROID) 100 mcg tablet Take 112 mcg by mouth daily 30 minutes before breakfast.   ? lidocaine/prilocaine (EMLA) 2.5/2.5 % topical cream Apply a thin film over port one hour prior to treatment. Do not rub in. Cover with plastic wrap after application   ? lisinopriL (ZESTRIL) 2.5 mg tablet Take 2.5 mg by mouth twice daily.   ? LORazepam (ATIVAN) 0.5 mg tablet Take one tablet by mouth every 6 hours as needed for Nausea.   ? nifedipine 0.3%/lidocaine 1.5%/white petrolatum(#) Apply a pea sized amount to the anal opening two to four times a day. Do NOT insert into anus.   ? OLANZapine (ZYPREXA ZYDIS) 5 mg rapid dissolve tablet Take nightly Days 1-4 of each chemo cycle, then nightly as needed for nausea   ? omega 3-dha-epa-fish oil (FISH OIL) 100-160-1,000 mg cap Take  by mouth three times weekly.   ? ondansetron HCL (ZOFRAN) 8 mg tablet Take one tablet by mouth every 8 hours as needed (nausea and vomiting).   ? prochlorperazine maleate (COMPAZINE) 10 mg tablet TAKE 1 TABLET BY MOUTH EVERY 6 HOURS AS NEEDED FOR NAUSEA OR VOMITING   ? traMADoL (ULTRAM) 50 mg tablet Take one tablet by mouth every 8 hours as needed for Pain. Indications: pain   ? traMADoL (ULTRAM) 50 mg tablet Take 50 mg by mouth every 8 hours as needed for Pain. Takes as little as 12.5 mg tramadol for pain.   ? vitamins, B complex tab Take 1 tablet by mouth three times weekly.   ? vitamins, multiple cap Take 1 capsule by mouth daily.     There were no vitals filed for this visit.  There is no height or weight on file to calculate BMI.                  Pain Addressed:  N/A    Patient Evaluated for a Clinical Trial: Patient not eligible for a treatment trial (including not needing treatment, needs palliative care, in remission).     [place] Cooperative Oncology Group performance status is 0, Fully active, able to carry on all pre-disease performance without restriction.Marland Kitchen     Physical Exam     CBC w diff  CBC with Diff Latest Ref Rng & Units 04/06/2021 03/23/2021 03/09/2021 03/02/2021 02/23/2021   WBC 4.5 - 11.0 K/UL 9.3 8.6 9.1 2.7(L) 6.0   RBC 4.0 - 5.0 M/UL 3.46(L) 3.32(L) 3.64(L) 3.73(L) 3.57(L)   HGB 12.0 - 15.0 GM/DL 11.2(L) 10.5(L) 11.6(L) 11.8(L) 11.3(L)   HCT 36 - 45 % 33.2(L) 31.8(L) 34.6(L) 36.2 34.0(L)   MCV 80 - 100 FL 96.0 95.7 95.1 97.0 95.2   MCH 26 - 34 PG 32.4 31.5 31.8 31.6 31.6   MCHC 32.0 - 36.0 G/DL 38.7 56.4 33.2 95.1 88.4 RDW 11 - 15 % 15.1(H) 14.4 13.9 13.6 13.7   PLT 150 - 400 K/UL 174 163 168 148(L) 178   MPV 7 - 11 FL 7.9 7.8 8.4 8.8 8.5   NEUT 41 -  77 % 75 - 72 51 60   ANC 1.8 - 7.0 K/UL 7.00 - 6.60 1.40(L) 3.60   LYMA 24 - 44 % 11(L) - 16(L) 30 25   ALYM 1.0 - 4.8 K/UL 1.00 - 1.50 0.80(L) 1.50   MONA 4 - 12 % 12 - 10 16(H) 12   AMONO 0 - 0.80 K/UL 1.10(H) - 0.90(H) 0.40 0.70   EOSA 0 - 5 % 1 - 1 3 2    AEOS 0 - 0.45 K/UL 0.10 - 0.10 0.10 0.10   BASA 0 - 2 % 1 - 1 0 1   ABAS 0 - 0.20 K/UL 0.10 - 0.00 0.00 0.10     Comprehensive Metabolic Profile  CMP Latest Ref Rng & Units 04/06/2021 03/23/2021 03/16/2021 03/09/2021 02/27/2021   NA 137 - 147 MMOL/L 137 140 136(L) 139 134(L)   K 3.5 - 5.1 MMOL/L 4.0 4.3 3.8 4.3 3.5   CL 98 - 110 MMOL/L 102 103 100 101 99   CO2 21 - 30 MMOL/L 27 27 27 27 25    GAP 3 - 12 8 10 9 11 10    BUN 7 - 25 MG/DL 12 10 15 14 20    CR 0.4 - 1.00 MG/DL 1.61 0.96 0.45 4.09 8.11   GLUX 70 - 100 MG/DL 83 77 914(N) 80 80   CA 8.5 - 10.6 MG/DL 8.8 9.0 9.4 9.4 9.2   TP 6.0 - 8.0 G/DL 6.7 6.2 6.7 6.6 6.6   ALB 3.5 - 5.0 G/DL 4.4 4.0 4.5 4.3 4.6   ALKP 25 - 110 U/L 71 77 101 74 64   ALT 7 - 56 U/L 12 33 80(H) 48 18   TBILI 0.3 - 1.2 MG/DL 8.2(N) 0.3 5.6(O) 1.3(Y) 0.5             Assessment and Plan:    Invasive lobular carcinoma, ER, PR positive, HER-2 negative diagnosed on 12/10/20. She underwent bilateral mastectomy and had 5/5 nodes involved. Plan to proceed with adjuvant chemotherapy with dd AC followed by taxol.  See 4 cycles of AC from 02/23/2021 through 04/06/2021.    --Plan to start paclitaxel on 04/20/2021.    APP Chemotherapy Education    IV Chemotherapy: The following is a summary of the patient's IV Chemotherapy Education.    A thorough pre-assessment and teaching session explaining the mechanism of action, possible side effects, precautions and instructions regarding Taxol for control intent was conducted. The patient will return on 04/20/2021 to initiate treatment. The cycle will repeat every 14 days for a total of 4 cycles.   Plan of administration was reviewed.    Both written and verbal information were given to the patient.    The planned course of treatment, anticipated benefits, material risks and potential side effects that may occur with this course of treatment were explained to the patient.  Side effects and their management were discussed in detail and include, but are not limited to: Anemia, thrombocytopenia, and neutropenia. We discussed the possibility of needing transfusion of blood or platelets if counts fall low enough to warrant. Also discussed implications of low white blood cell count including risk of infection including febrile neutropenia.  Other side effects include alopecia, nausea, vomiting, allergic reaction, mucositis, diarrhea, edema, alteration in liver function, numbness/tingling, nail changes, muscle and joint pain, skin changes.    Reproductive concerns were not discussed with the patient  Infertility risks were not applicable and therefore not discussed    Appropriate handling of body secretions  and waste at home were reviewed as applicable.    No prescriptions for supportive medications were e-scripted to their pharmacy. She has antiemetics to use as needed and is familiar with their use. Drug to drug interactions were reviewed as applicable.     The patient has received contact information for the clinic and was instructed on when and who to call.     The patient verbalized understanding, was given the opportunity to ask questions, and the consent form was signed.     Follow up appointment with physician on 04/20/21 with labs at that time.     This was a 30 minute face to face encounter with 30 minutes spent in counseling and coordination of care.      RTC: 1 week for MD visit, labs and treatment. Call sooner for questions or concerns.

## 2021-04-14 ENCOUNTER — Encounter: Admit: 2021-04-14 | Discharge: 2021-04-14 | Payer: MEDICARE

## 2021-04-14 NOTE — Progress Notes
Name: Cindy Randolph          MRN: [id number]      DOB: [birth year]      AGE: 60 y.o.   DATE OF SERVICE: 04/20/2021    Subjective:             Reason for Visit:  Cancer Follow up      Cindy Randolph is a 60 y.o. female.     Cancer Staging  Malignant neoplasm of left breast in female, estrogen receptor positive (HCC)  Staging form: Breast, AJCC 8th Edition  - Clinical stage from 12/22/2020: Stage IA (cT1c, cN0, cM0, G1, ER+, PR+, HER2-) - Signed by Massie Kluver, MD on 12/22/2020  - Pathologic stage from 02/02/2021: Stage IB (pT3, pN2(sn), cM0, G1, ER+, PR+, HER2-) - Signed by Nigel Berthold, PA-C on 02/02/2021      History of Present Illness  Left breast cancer.    1.  Noted intermittent left breast discomfort over several months.    2.  Routine screening mammogram 11/25/2020 at [facility].  This revealed scattered fibroglandular tissue density.  Benign appearing microcalcifications.  Unchanged right upper central anterior breast low-density circumscribed masses dating back to 2013.  Focal asymmetry located within the left posterior central breast, 12 o'clock position, 7.3 cm from the nipple.  3.  Left diagnostic mammogram 12/04/2020 revealed irregular density at the 12:00 left breast persisted.  4.  Left breast ultrasound 12/04/2020 revealed ill-defined area which was slightly hypoechoic at the 12 o'clock position, 7 cm from nipple.  This was indeterminate and biopsy was recommended.  5.  12/10/2020 ultrasound-guided biopsy revealed a grade 1 invasive lobular carcinoma ER 91 to 100%, PR 11 to 20%, HER2 1+, Ki-67 of 2 to 5% at the 12 o'clock position.  6.  MRI Breast 12/26/20 MRI: ?RIGH T- benignLEFT - ?multiple faint masses/NME extending in upper and inner quadrant - 6.4cm extent; extends to base of nipple; small level I nodes  7.  01/22/21 bilateral mastectomy by Dr. Joya Randolph by Dr. Dayton Randolph  8.  Pathology: LEFT mastectomy ? ?ILC, grade I; ?multifocal with connection/merge together - total extent 7.4cm; LCIS; margins clear; 5/5 nodes Profile - er 96%; pr 10%; her 2 negative; ki 67 3%   RIGHT mastectomy - benign   9.  02/10/21 CT C/A/P No evidence of met disease; Bone scan negative  10.  Dose Dense AC X4 02/23/21-04/06/21; Scheduled to start Taxol 04/20/21 but patient declined      Genetics: [place] panel (46 genes) VUS in RET    Past medical history: Thyroid cancer diagnosed in 1980    Social history: She lives with her husband and 6 children.  She does not smoke or abuse alcohol.  She is a retired Engineer, civil (consulting).    Family history: Her mother had breast cancer at the age of 69.  There is no family history of ovarian or prostate cancer.    Interval History:  Cindy Randolph presents in follow-up of her breast cancer.  She has completed 4 cycles of before meals and today she was due to start Taxol.  She has concerns and wants to discuss pros and cons of proceeding with therapy.         Review of Systems   Constitutional: Negative for activity change, appetite change, fatigue and fever.   HENT: Negative for congestion and sore throat.    Eyes: Negative.    Respiratory: Negative for cough and shortness of breath.    Cardiovascular: Negative.  Gastrointestinal: Negative for abdominal distention, constipation and diarrhea.   Endocrine: Negative.    Genitourinary: Negative for difficulty urinating.   Musculoskeletal: Negative for arthralgias, myalgias, neck pain and neck stiffness.   Skin: Negative for rash.   Neurological: Negative for dizziness, light-headedness and numbness.   Hematological: Negative for adenopathy. Does not bruise/bleed easily.   Psychiatric/Behavioral: Negative.          Objective:         ? acetaminophen (TYLENOL) 325 mg tablet Take two tablets by mouth every 6 hours. Take scheduled for 3 days after surgery, then as needed. Do not exceed 4,000mg  in a 24 hour period.   ? cholecalciferol (vitamin D3) (VITAMIN D3 PO) Take  by mouth.   ? dexAMETHasone (DECADRON) 4 mg tablet Take two tablets by mouth daily. On Days 2-4 of each cycle.   ? diazePAM (VALIUM) 2 mg tablet Take one tablet by mouth every 6 hours as needed for Anxiety.   ? diclofenac sodium DR (VOLTAREN) 50 mg tablet Take 50 mg by mouth twice daily. Take with food.   ? hydrocortisone (ANUSOL-HC) 2.5 % rectal cream APPLY RECTALLY TO THE AFFECTED AREA 2 TO 4 TIMES PER DAY AS NEEDED FOR HEMORRHOIDS   ? Lactobacillus acidophilus (PROBIOTIC) 10 billion cell cap Take  by mouth daily.   ? levothyroxine (SYNTHROID) 100 mcg tablet Take 112 mcg by mouth daily 30 minutes before breakfast.   ? lidocaine/prilocaine (EMLA) 2.5/2.5 % topical cream Apply a thin film over port one hour prior to treatment. Do not rub in. Cover with plastic wrap after application   ? lisinopriL (ZESTRIL) 2.5 mg tablet Take 2.5 mg by mouth twice daily.   ? LORazepam (ATIVAN) 0.5 mg tablet Take one tablet by mouth every 6 hours as needed for Nausea.   ? nifedipine 0.3%/lidocaine 1.5%/white petrolatum(#) Apply a pea sized amount to the anal opening two to four times a day. Do NOT insert into anus.   ? OLANZapine (ZYPREXA ZYDIS) 5 mg rapid dissolve tablet Take nightly Days 1-4 of each chemo cycle, then nightly as needed for nausea   ? omega 3-dha-epa-fish oil (FISH OIL) 100-160-1,000 mg cap Take  by mouth three times weekly.   ? ondansetron HCL (ZOFRAN) 8 mg tablet Take one tablet by mouth every 8 hours as needed (nausea and vomiting).   ? prochlorperazine maleate (COMPAZINE) 10 mg tablet TAKE 1 TABLET BY MOUTH EVERY 6 HOURS AS NEEDED FOR NAUSEA OR VOMITING   ? traMADoL (ULTRAM) 50 mg tablet Take one tablet by mouth every 8 hours as needed for Pain. Indications: pain   ? traMADoL (ULTRAM) 50 mg tablet Take 50 mg by mouth every 8 hours as needed for Pain. Takes as little as 12.5 mg tramadol for pain.   ? vitamins, B complex tab Take 1 tablet by mouth three times weekly.   ? vitamins, multiple cap Take 1 capsule by mouth daily.     Vitals:    04/20/21 0815   BP: (!) 147/83   BP Source: Arm, Right Upper   Pulse: 83   Temp: 36.9 ?C (98.5 ?F)   Resp: 16   SpO2: 97%   O2 Device: None (Room air)   TempSrc: Oral   PainSc: Five   Weight: 65.6 kg (144 lb 9.6 oz)     Body mass index is 26.01 kg/m?Marland Kitchen     Pain Score: Five  Pain Loc: Back (hands, neck)    Fatigue Scale: 7    Pain Addressed:  N/A    Patient Evaluated for a Clinical Trial: No treatment clinical trial available for this patient.     [place] Cooperative Oncology Group performance status is 0, Fully active, able to carry on all pre-disease performance without restriction.Marland Kitchen     Physical Exam  Constitutional:       Appearance: She is well-developed.   HENT:      Head: Normocephalic.   Eyes:      Conjunctiva/sclera: Conjunctivae normal.      Pupils: Pupils are equal, round, and reactive to light.   Cardiovascular:      Rate and Rhythm: Normal rate and regular rhythm.      Heart sounds: Normal heart sounds. No murmur heard.    No gallop.   Pulmonary:      Effort: Pulmonary effort is normal. No respiratory distress.      Breath sounds: Normal breath sounds. No wheezing or rales.   Abdominal:      General: Bowel sounds are normal. There is no distension.      Palpations: Abdomen is soft.   Musculoskeletal:         General: Normal range of motion.      Cervical back: Normal range of motion and neck supple.   Lymphadenopathy:      Cervical: No cervical adenopathy.      Upper Body:      Right upper body: No supraclavicular or epitrochlear adenopathy.      Left upper body: No supraclavicular or epitrochlear adenopathy.   Neurological:      Mental Status: She is alert and oriented to person, place, and time.               Assessment and Plan:       Left breast, ILC, T3N2, ER/PR positive, her2 negative.  I reviewed with [name] that she completed 4 cycles of dose dense AC and the best regimen is to proceed with [facility] followed by Taxol either weekly or every 2 weeks for 4 doses.  She is very concerned about neuropathy.  She was asking about Abraxane.  We discussed that Abraxane can be substituted for regular Taxol if somebody has a hypersensitivity reaction but standard therapy is regular Taxol.  She is anxious about the Taxol and I discussed whether or not she proceeded with therapy we should not give it today.  After our detailed discussion she does not want to pursue any further chemotherapy.  I will get her back into see Dr. Leveda Anna to undergo her axillary lymph node dissection and she would also benefit from adjuvant radiation.    From my standpoint she would benefit from adjuvant endocrine therapy.  I did call in letrozole into her pharmacy and reviewed side effects with her.  With her having lots of lymph nodes involved I also did discuss with her the benefit of adjuvant Abemaciclib.  She does seem open to the Abemaciclib.  I would like to see how she does on letrozole by itself before we add in the Abemaciclib and we will discuss this after she completes radiation.    When she undergoes her axillary lymph node dissection if possible her port can be removed at the same time.    GI.  She is doing well from this standpoint.      Genetics.  She has a VUS in Ret

## 2021-04-20 ENCOUNTER — Encounter: Admit: 2021-04-20 | Discharge: 2021-04-20 | Payer: MEDICARE

## 2021-04-20 DIAGNOSIS — E079 Disorder of thyroid, unspecified: Secondary | ICD-10-CM

## 2021-04-20 DIAGNOSIS — M4800 Spinal stenosis, site unspecified: Secondary | ICD-10-CM

## 2021-04-20 DIAGNOSIS — C50919 Malignant neoplasm of unspecified site of unspecified female breast: Secondary | ICD-10-CM

## 2021-04-20 DIAGNOSIS — M2550 Pain in unspecified joint: Secondary | ICD-10-CM

## 2021-04-20 DIAGNOSIS — B999 Unspecified infectious disease: Secondary | ICD-10-CM

## 2021-04-20 DIAGNOSIS — IMO0002 Ulcer: Secondary | ICD-10-CM

## 2021-04-20 DIAGNOSIS — M5136 Other intervertebral disc degeneration, lumbar region: Secondary | ICD-10-CM

## 2021-04-20 DIAGNOSIS — R011 Cardiac murmur, unspecified: Secondary | ICD-10-CM

## 2021-04-20 DIAGNOSIS — D539 Nutritional anemia, unspecified: Secondary | ICD-10-CM

## 2021-04-20 DIAGNOSIS — C73 Malignant neoplasm of thyroid gland: Secondary | ICD-10-CM

## 2021-04-20 DIAGNOSIS — M5134 Other intervertebral disc degeneration, thoracic region: Secondary | ICD-10-CM

## 2021-04-20 DIAGNOSIS — R519 Generalized headaches: Secondary | ICD-10-CM

## 2021-04-20 DIAGNOSIS — I10 Essential (primary) hypertension: Secondary | ICD-10-CM

## 2021-04-20 DIAGNOSIS — M549 Dorsalgia, unspecified: Secondary | ICD-10-CM

## 2021-04-20 DIAGNOSIS — S83209A Unspecified tear of unspecified meniscus, current injury, unspecified knee, initial encounter: Secondary | ICD-10-CM

## 2021-04-20 DIAGNOSIS — C801 Malignant (primary) neoplasm, unspecified: Secondary | ICD-10-CM

## 2021-04-20 DIAGNOSIS — E039 Hypothyroidism, unspecified: Secondary | ICD-10-CM

## 2021-04-20 DIAGNOSIS — F419 Anxiety disorder, unspecified: Secondary | ICD-10-CM

## 2021-04-20 DIAGNOSIS — C50112 Malignant neoplasm of central portion of left female breast: Secondary | ICD-10-CM

## 2021-04-20 DIAGNOSIS — M5030 Other cervical disc degeneration, unspecified cervical region: Secondary | ICD-10-CM

## 2021-04-20 DIAGNOSIS — F32A Depression: Secondary | ICD-10-CM

## 2021-04-20 MED ORDER — HEPARIN, PORCINE (PF) 100 UNIT/ML IV SYRG
500 [IU] | Freq: Once | 0 refills | Status: AC
Start: 2021-04-20 — End: ?

## 2021-04-20 MED ORDER — LETROZOLE 2.5 MG PO TAB
2.5 mg | ORAL_TABLET | Freq: Every day | ORAL | 3 refills | 16.50000 days | Status: AC
Start: 2021-04-20 — End: ?

## 2021-04-20 NOTE — Progress Notes
PATIENT NOTIFIED 04/20/21@9 :23 ON CELL REGARDING BONE DENSITY---RGL

## 2021-04-20 NOTE — Progress Notes
Patient presents to the infusion room.  Patient declined using port for lab draw. Labs drawn peripherally from R AC.  Patient was seen by Dr.Crane for an office visit.   She will not get treatment today.

## 2021-04-22 ENCOUNTER — Ambulatory Visit: Admit: 2021-04-22 | Discharge: 2021-04-22 | Payer: MEDICARE

## 2021-04-22 ENCOUNTER — Encounter: Admit: 2021-04-22 | Discharge: 2021-04-22 | Payer: MEDICARE

## 2021-04-22 DIAGNOSIS — Z170 Estrogen receptor positive status [ER+]: Secondary | ICD-10-CM

## 2021-04-22 DIAGNOSIS — C50112 Malignant neoplasm of central portion of left female breast: Principal | ICD-10-CM

## 2021-04-24 ENCOUNTER — Encounter: Admit: 2021-04-24 | Discharge: 2021-04-24 | Payer: MEDICARE

## 2021-04-29 ENCOUNTER — Encounter: Admit: 2021-04-29 | Discharge: 2021-04-29 | Payer: MEDICARE

## 2021-04-29 DIAGNOSIS — M549 Dorsalgia, unspecified: Secondary | ICD-10-CM

## 2021-04-29 DIAGNOSIS — M2550 Pain in unspecified joint: Secondary | ICD-10-CM

## 2021-04-29 DIAGNOSIS — F419 Anxiety disorder, unspecified: Secondary | ICD-10-CM

## 2021-04-29 DIAGNOSIS — C50112 Malignant neoplasm of central portion of left female breast: Secondary | ICD-10-CM

## 2021-04-29 DIAGNOSIS — D539 Nutritional anemia, unspecified: Secondary | ICD-10-CM

## 2021-04-29 DIAGNOSIS — M5030 Other cervical disc degeneration, unspecified cervical region: Secondary | ICD-10-CM

## 2021-04-29 DIAGNOSIS — Z01818 Encounter for other preprocedural examination: Secondary | ICD-10-CM

## 2021-04-29 DIAGNOSIS — R011 Cardiac murmur, unspecified: Secondary | ICD-10-CM

## 2021-04-29 DIAGNOSIS — M4800 Spinal stenosis, site unspecified: Secondary | ICD-10-CM

## 2021-04-29 DIAGNOSIS — E079 Disorder of thyroid, unspecified: Secondary | ICD-10-CM

## 2021-04-29 DIAGNOSIS — I10 Essential (primary) hypertension: Secondary | ICD-10-CM

## 2021-04-29 DIAGNOSIS — M5134 Other intervertebral disc degeneration, thoracic region: Secondary | ICD-10-CM

## 2021-04-29 DIAGNOSIS — S83209A Unspecified tear of unspecified meniscus, current injury, unspecified knee, initial encounter: Secondary | ICD-10-CM

## 2021-04-29 DIAGNOSIS — F32A Depression: Secondary | ICD-10-CM

## 2021-04-29 DIAGNOSIS — E039 Hypothyroidism, unspecified: Secondary | ICD-10-CM

## 2021-04-29 DIAGNOSIS — C801 Malignant (primary) neoplasm, unspecified: Secondary | ICD-10-CM

## 2021-04-29 DIAGNOSIS — IMO0002 Ulcer: Secondary | ICD-10-CM

## 2021-04-29 DIAGNOSIS — M5136 Other intervertebral disc degeneration, lumbar region: Secondary | ICD-10-CM

## 2021-04-29 DIAGNOSIS — B999 Unspecified infectious disease: Secondary | ICD-10-CM

## 2021-04-29 DIAGNOSIS — C50919 Malignant neoplasm of unspecified site of unspecified female breast: Secondary | ICD-10-CM

## 2021-04-29 DIAGNOSIS — C73 Malignant neoplasm of thyroid gland: Secondary | ICD-10-CM

## 2021-04-29 DIAGNOSIS — R519 Generalized headaches: Secondary | ICD-10-CM

## 2021-04-29 MED ORDER — CEFAZOLIN INJ 1GM IVP
2 g | Freq: Once | INTRAVENOUS | 0 refills
Start: 2021-04-29 — End: ?

## 2021-04-29 NOTE — Patient Instructions
BREAST SURGERY POST-OPERATIVE INSTRUCTIONS    DIET  -You have no dietary restrictions. Please continue with a healthy balanced diet.  -Do not drink alcohol while taking narcotic pain medication.    ACTIVITY  -No driving while taking narcotic pain medication.  -No lifting greater than 10 pounds.   -No repetitive motions with the arm on the side of surgery (ie. laundry, vacuuming, washing windows/floors, etc.)  -Begin the exercises found in your treatment guide 24 hours after surgery.  -You may resume your normal activity once cleared by your surgeon. This can be discussed at your post-operative appointment.    INCISION CARE  -Keep your incision clean and dry.    -You may shower 24 hours following your procedure. It is safe for the incisions to get wet unless otherwise instructed by your surgeon. Pat incisions dry after showering.  -Avoid applying deodorants, powders, creams, lotions, etc. to your incision for 4 weeks.  -Usually, there are no stitches to be removed. You will either have a Dermabond (skin glue) or Therabond (silver covering) dressing over your incision.    *Dermabond is an antimicrobial barrier that will begin to flake after approximately 1 week. Do not peel to remove prior to flaking. Do NOT apply ointments to your incisions over the Dermabond, as these substances can weaken the barrier and allow for skin edge separation.    *Therabond is a silver impregnated dressing that promotes wound healing. This dressing is to stay in place until your surgeon instructs you to remove it.  -Your incision should gradually look better each day.  If you notice unusual swelling, redness, drainage, have increasing pain at the site, or have a fever greater than 100 degrees, notify your physician immediately.  -You may have a pink post-surgery bra and fluffs in place.  This can be removed 24 hours after surgery to shower. Continue to wear the pink bra or a snug sports bra until your post-operative appointment. Do NOT wear underwire bras.    SIGNS & SYMPTOMS  -Please contact your doctor if you have any of the following symptoms: temperature higher than 100 degrees F, uncontrolled pain, persistent nausea and/or vomiting, difficulty breathing or breast redness, leakage or incision breakdown/opening.     IF YOU HAVE A DRAIN  *WASH HANDS PRIOR TO ANY HANDLING OF DRAIN.   -Please write down daily (total for 24 hours) drain output and empty multiple times a day if needed or according to physician's instructions. Record the output in milliliters.  -There is a CHG drain dressing in place over the entry site of your drain. This is to remain in place until removed by your surgeon. There is an antimicrobial gel in the center rectangle of the dressing. This is normal and not a sign of fluid leaking. If there is physical liquid leaking on the skin outside the area of the drain, please notify your surgeons office.   -To empty the drain, open the lid on top of the bulb.  Pour the fluid into a measuring cup and record.  Close by squeezing the bulb until as much air as possible is removed, then cap the lid to recreate suction.   -Strip the drain twice daily or as needed by pinching the tubing near the skin with both hands.  Keep hand closest to you steady and with the other, slowly squeeze the liquid toward the bulb.  This will prevent any clots or debris from clogging the drain.   -It is okay to shower with the drain  and dressing in place.  Pat the dressing dry after showering.    *When showering consider placing your back to the water and letting the water run over the surgical areas. Soap and water contact over the surgical sites is OK. Please do not soak or scrub the sites.     *A lanyard can be used to hook the drains around your neck to allow freedom of hands for showering.    -Please bring the drain record to your next clinic appointment.   -Secure drains to your post-operative bra or use a safety pin to pin to clothing. Do not let drains dangle without support.   -DO NOT TRY TO PUSH THE DRAIN BACK IN IF IT GETS PULLED OUT (even a little bit), AS THIS IS A BIG INFECTION RISK.  If you have any problems with the drain (ex. large amounts of fluid, very bloody fluid, or loss of suction), please call your doctor.    MEDICATIONS  -Do not exceed 4000mg  of Tylenol (acetaminophen) in a 24-hour period. The pain medication prescribed may have a narcotic and Tylenol together. Please verify this on your medication bottle prior to taking additional Tylenol.    OPIOID (NARCOTIC) PAIN MEDICATION SAFETY  -We care about your comfort and believe you may need opioid medications at this time to treat your pain.  An opioid is a strong pain medication.  It is only available by prescription for moderate to severe pain.  Usually, these medications are used for only a short time to treat pain.    -When used the right way, opioids are safe and effective medications to treat your pain.  Yet, when used in the wrong way, opioids can be dangerous for you or others.  Opioids do not work for everyone.  Most patients do not get full relief of their pain from opioid medication; full relief of your pain may not be possible.    -For your safety, we ask you to follow these instructions:  *Only take your opioid medication as prescribed.  If your pain is not controlled with the prescribed dose or the medication is not lasting long enough, call your doctor.  *Do not break or crush your opioid medication unless your doctor or pharmacist says you can.  With certain medications this can be dangerous and may cause death.  *Never share your medication with others even if they appear to have a good reason.  Never take someone else?s pain medication - this is dangerous and illegal.  Overdoses and deaths have occurred.  *Keep your opioid medications safe, as you would with cash, in a lock box or similar container.  *Make sure your opioids are going to be secure, especially if you are around children or teens.   *Talk with your doctor or pharmacist if you have questions about taking other medications.   *Avoid driving, operative machinery or drinking alcohol while taking opioid pain medication.  This may be unsafe.     CONSTIPATION PREVENTION  -Most patients require narcotic pain medication following surgery and these medications frequently cause significant constipation. Constipation can also occur post-operatively due to anesthesia, inactivity and decrease in oral intake. Constipation can cause discomfort, bloating, nausea and vomiting. Below are recommendations on how to treat and prevent constipation if you experience this following surgery.    -Start a daily laxative regimen as soon as you start any narcotic pain medication or see any deviation from your normal bowel pattern. You will be given a prescription for Senna-S after  surgery. If you stay overnight after your surgery, a laxative will be started while you are in the hospital.      -Medications that can help with constipation:    *Senna-S    *Miralax, 1-2 capfuls daily    *Fiber supplements (Fibercon or Benefiber). Follow manufacturer dosing recommendations.                                          You can also consider dietary fiber options such as prunes or prune juice.    *Probiotics (Bio-K+, Culturelle, Align or Florastor), 1-2 daily. Do not take a probiotic if you are on chemotherapy unless you have received permission from your oncologist.     *Milk of Magnesia 1,2000mg /19mL, 2-4 doses daily. Do not take if you have kidney issues or are on dialysis.     -If you are still having trouble with constipation despite using the above medications for 1-2 days without a bowel movement or have significant discomfort from constipation you may try the below over the counter option. Do not go longer than 2 days without a bowel movement before you try taking additional laxatives.    *Magnesium Citrate (1-2 bottles in 24 hour period). Do not take if you have kidney issues or are on dialysis.       For questions or concerns regarding your hospital stay:  - DURING BUSINESS HOURS (8:00 AM - 4:30 PM):    Call [phone] to speak to your surgeon?s nurse    - AFTER BUSINESS HOURS (4:30 PM - 8:00 AM, on weekends, or holidays):  Call [phone] and ask to page the BREAST SURGERY team      IF A PLASTIC SURGEON WAS INVOLVED IN YOUR SURGERY:    - DURING BUSINESS HOURS (8:00 AM - 4:30 PM):    Call the office at [phone]    - AFTER BUSINESS HOURS (4:30 PM - 8:00 AM, on weekends, or holidays):  Call [phone] and ask to page the PLASTIC SURGERY team    *If you had reconstruction with a plastic surgeon, all pain medication refills will be completed by their office.        POST-OPERATIVE EXERCISES WITHOUT DRAINS    If you don?t have drains - or if your drains have been removed -  you can now begin this set of exercises. These are designed to improve your range of motion and strengthen your arm and shoulder.  Be sure to complete the exercises carefully to achieve a slow stretch, and avoid bouncing or jerking your arm.  Full range of motion should return within one to two months following your surgery, so if you continue to have trouble completing your normal activities (such as dressing, grooming, etc.) talk to your doctor, who may refer you to a physical therapist.      Exercise 1.  In this exercise, you will rotate your shoulders slowly in a backward motion,  then in a forward motion.    Lie down face up and relax your shoulders.  Rotate your shoulders slowly in a backward motion.  Reverse the motion and slowly rotate them forward.  Repeat five times, three sets per day.      Exercise 2. This exercise has you squeeze your shoulder blades together.    Lie down face up and keep your arms at your sides.  Squeeze your shoulder blades together.  Hold this  position for five seconds.  Repeat five times, three sets per day.       Exercise 3.  During this exercise, you will slowly pull your elbow and arm across your chest.    Lie down face up and grab the elbow that?s on the side of your surgery with your other hand.  Slowly pull your elbow and arm across your chest.  You should feel a stretch.  Hold this position for five seconds.  Repeat five times, three sets per day      Exercise 4.  Here, you will raise your arm (on your surgery side) over your head as far as you can reach.    Lie down face up and raise your arm (on your surgery side) over your head as far as you can reach.  Keep your thumb down and your elbow straight.  Hold this position for five seconds and then slowly return your arm to the original position.  Repeat five times, three sets per day      Exercise 5.  This exercise stretches across your chest as you slowly lower your elbows down to the sides.    Lie down face up and place your hands behind your neck.  Point your elbows up.  Slowly lower your elbows down to touch the bed.  Repeat five times, three sets per day       Exercise 6.  You will use a small, rolled towel during this exercise.    Lie down on your back on a small, rolled towel (about 2-3 inches thick) placed between your shoulder blades.  Remain in this position on the towel for 5 minutes.  Repeat three times each day       Exercise 7.  You should feel a stretch in your shoulder as you gently pull your arm (on the surgery side) behind you.    Stand and reach behind you with the arm oth the surgery side.  Grasp that arm with your other hand.  Gently pull your arm up.  You should feel a stretch in your shoulder.  Hold this position for five seconds.  Repeat five times, three sets per day       Exercise 8.  This stretch requires the use of a wall to stretch your shoulders.    Stand in a corner 1 to 2 feet away from the wall.  Place your hands on the wall.  Lean in to gently stretch your chest and your shoulders on the surgery side.  Vary the exercise by placing your hands higher or lower on the wall, or by stepping back from the corner slightly.  Hold the stretch for 30 seconds.  Repeat five times, three sets per day       EXAMPLES OF POSTOPERATIVE BRAS    -You will go home from surgery with a pink bra in place.     -This is only a guideline with examples of other appropriate postoperative bras.  You do not need to purchase these unless you would like additional bras to wear.  [facility] does not profit from any of these bras  - Wear non-under wire bras for 4 weeks.   - Sports bras that snap or zip in front are usually the easiest after breast surgery.     [employer]:  women?s power shape max support front closure   Price: $24.99 + tax       -    [employer]: Fruit [employer]oom Comfort  front close       Price: $7-$8 + tax       [employer]:  [employer] Bra [phone]  Samuella Cota [phone] + tax          [employer] Armour- ARMOUR eclipse high impact zip bra   Higher quality.  Will last forever if you hang up to dry.  Has best support for exercise.  Online or in stores: [employer]our zip front sports bra  Can find at [employer], sports stores, some [employer], or online.    Price varies: $59.99 and up + tax          -     [employer]: [employer]'s Easy Does It No Dig Wire-Free Bra - [id number]        Price: $19.99 - $38 + Tax

## 2021-04-30 ENCOUNTER — Encounter: Admit: 2021-04-30 | Discharge: 2021-04-30 | Payer: MEDICARE

## 2021-04-30 DIAGNOSIS — C50919 Malignant neoplasm of unspecified site of unspecified female breast: Secondary | ICD-10-CM

## 2021-04-30 DIAGNOSIS — R519 Generalized headaches: Secondary | ICD-10-CM

## 2021-04-30 DIAGNOSIS — S83209A Unspecified tear of unspecified meniscus, current injury, unspecified knee, initial encounter: Secondary | ICD-10-CM

## 2021-04-30 DIAGNOSIS — C73 Malignant neoplasm of thyroid gland: Secondary | ICD-10-CM

## 2021-04-30 DIAGNOSIS — M5136 Other intervertebral disc degeneration, lumbar region: Secondary | ICD-10-CM

## 2021-04-30 DIAGNOSIS — D539 Nutritional anemia, unspecified: Secondary | ICD-10-CM

## 2021-04-30 DIAGNOSIS — E039 Hypothyroidism, unspecified: Secondary | ICD-10-CM

## 2021-04-30 DIAGNOSIS — M549 Dorsalgia, unspecified: Secondary | ICD-10-CM

## 2021-04-30 DIAGNOSIS — M4800 Spinal stenosis, site unspecified: Secondary | ICD-10-CM

## 2021-04-30 DIAGNOSIS — I10 Essential (primary) hypertension: Secondary | ICD-10-CM

## 2021-04-30 DIAGNOSIS — B999 Unspecified infectious disease: Secondary | ICD-10-CM

## 2021-04-30 DIAGNOSIS — F419 Anxiety disorder, unspecified: Secondary | ICD-10-CM

## 2021-04-30 DIAGNOSIS — C801 Malignant (primary) neoplasm, unspecified: Secondary | ICD-10-CM

## 2021-04-30 DIAGNOSIS — E079 Disorder of thyroid, unspecified: Secondary | ICD-10-CM

## 2021-04-30 DIAGNOSIS — R011 Cardiac murmur, unspecified: Secondary | ICD-10-CM

## 2021-04-30 DIAGNOSIS — F32A Depression: Secondary | ICD-10-CM

## 2021-04-30 DIAGNOSIS — IMO0002 Ulcer: Secondary | ICD-10-CM

## 2021-04-30 DIAGNOSIS — M5030 Other cervical disc degeneration, unspecified cervical region: Secondary | ICD-10-CM

## 2021-04-30 DIAGNOSIS — M5134 Other intervertebral disc degeneration, thoracic region: Secondary | ICD-10-CM

## 2021-04-30 DIAGNOSIS — M2550 Pain in unspecified joint: Secondary | ICD-10-CM

## 2021-04-30 NOTE — Progress Notes
AMB Clinical Nutrition  Returned call to Patient with questions about calcium supplement and if she needs Vitamin K and Magnesium.  Patient eating vegetables, greens, yogurt and cheese, small amounts of milk, nuts and seeds.  Advise 1200 mg calcium daily from calcium carbonate given as 600 g twice daily. Discussed marketplace options.  Advise 2 calcium rich foods daily.  2.5 cups fruits and vegetables daily.  1 serving nuts/seeds daily.    Philippa Chester RDN, [place], [place]  Oncology Nutrition Specialist  [phone]

## 2021-05-01 ENCOUNTER — Encounter: Admit: 2021-05-01 | Discharge: 2021-05-01 | Payer: MEDICARE

## 2021-05-04 NOTE — Progress Notes
..  This prechart is intended to be a reference for patient appointments. Information is gathered from in chart as well as external records review. Information will be clarified/verified/updated in final documentation in the office visit.     Pre Clinic Pre Chart:    Cindy Randolph is a 60yo female with a history of breast cancer on chemotherapy who follows up ~6wks after starting nifedipine/lidocaine for anal fissure.    Problem List:  Hemorrhoid  Breast Cancer    03/16/2021 - Med Onc:  I,ve used the Prep H, baths and witch Hazel wipes, but the external hemorrhoid continues, and is frustrating. I?ve dealt with a much smaller, infrequent problem since my last couple of pregnancies but the increased irregularity since Chemo, and more irritated state  of the intestine means I?m chasing constipation vs very soft stool. I?m normally much more regular. It is not strangulated--that did happen once a decade ago and Prep H and sitz baths fixed it. But this is just stubborn. Is there a prescription ointment to switch to that Dr. Neil Crouch could prescribe? I?m past the recommended usage time of PrepH.  To the good- I?ve only needed Zofran or Compazine one a day, if that. Nausea is certainly better these last few days! Re: diet, I eat fruits and vegetables and some whole grains and legumes (little to no white flour/sugar) so I do get natural soluble and insoluble fiber.  Metamucil and or Miralax which used as directed when indicated have proved overrated in results (and make one more crampy).  This really is tiring and I could use one less very annoying consequence of Chemo.  I'm seeing Dr. Essie Christine today at [facility] for a scheduled F/U for my right knee. It still has swelling, & is sore at times. Dealing with so much. But again, in general, it?s much better than the first cycle.   I did shave my head when the hair came off in handfuls this week and I?m dealing well with that aspect of Chemo--at least! Getting walks and stationary bike sessions in!    2017 - Colonoscopy:

## 2021-05-06 ENCOUNTER — Encounter: Admit: 2021-05-06 | Discharge: 2021-05-06 | Payer: MEDICARE

## 2021-05-06 DIAGNOSIS — C50919 Malignant neoplasm of unspecified site of unspecified female breast: Secondary | ICD-10-CM

## 2021-05-06 DIAGNOSIS — M4800 Spinal stenosis, site unspecified: Secondary | ICD-10-CM

## 2021-05-06 DIAGNOSIS — E039 Hypothyroidism, unspecified: Secondary | ICD-10-CM

## 2021-05-06 DIAGNOSIS — F32A Depression: Secondary | ICD-10-CM

## 2021-05-06 DIAGNOSIS — R519 Generalized headaches: Secondary | ICD-10-CM

## 2021-05-06 DIAGNOSIS — F419 Anxiety disorder, unspecified: Secondary | ICD-10-CM

## 2021-05-06 DIAGNOSIS — B999 Unspecified infectious disease: Secondary | ICD-10-CM

## 2021-05-06 DIAGNOSIS — S83209A Unspecified tear of unspecified meniscus, current injury, unspecified knee, initial encounter: Secondary | ICD-10-CM

## 2021-05-06 DIAGNOSIS — E079 Disorder of thyroid, unspecified: Secondary | ICD-10-CM

## 2021-05-06 DIAGNOSIS — IMO0002 Ulcer: Secondary | ICD-10-CM

## 2021-05-06 DIAGNOSIS — R011 Cardiac murmur, unspecified: Secondary | ICD-10-CM

## 2021-05-06 DIAGNOSIS — M5136 Other intervertebral disc degeneration, lumbar region: Secondary | ICD-10-CM

## 2021-05-06 DIAGNOSIS — M5134 Other intervertebral disc degeneration, thoracic region: Secondary | ICD-10-CM

## 2021-05-06 DIAGNOSIS — K602 Anal fissure, unspecified: Secondary | ICD-10-CM

## 2021-05-06 DIAGNOSIS — D539 Nutritional anemia, unspecified: Secondary | ICD-10-CM

## 2021-05-06 DIAGNOSIS — M5030 Other cervical disc degeneration, unspecified cervical region: Secondary | ICD-10-CM

## 2021-05-06 DIAGNOSIS — C73 Malignant neoplasm of thyroid gland: Secondary | ICD-10-CM

## 2021-05-06 DIAGNOSIS — C801 Malignant (primary) neoplasm, unspecified: Secondary | ICD-10-CM

## 2021-05-06 DIAGNOSIS — M2550 Pain in unspecified joint: Secondary | ICD-10-CM

## 2021-05-06 DIAGNOSIS — I10 Essential (primary) hypertension: Secondary | ICD-10-CM

## 2021-05-06 DIAGNOSIS — M549 Dorsalgia, unspecified: Secondary | ICD-10-CM

## 2021-05-06 NOTE — Patient Instructions
Thank you for visiting me today. Your time is important and if you had to wait at all today, I apologize. My goal is to run exactly on time; however, on occasion, I get behind in clinic due to unexpected patient issues or space constraints. Please use the nurses line below if you have any questions or concerns at any time. Leave a message if necessary and your call will be returned. Feel free to send us a MyChart message as well. Messages are reviewed Monday through Friday 8am to 4pm. If it is after 4pm, it may be returned the following business day. Our office is closed on weekends and major holidays. If you have an urgent need after hours please call [phone] and ask for the surgical oncology resident on call to be paged.     If testing was ordered today please know it will automatically be released to your mychart. You may review this before I do. I will send you a message with this result once I have reviewed this.     Please call my nurse listed below for questions or concerns.  Kiersten Galemore (Dr. Martin and Joshlyn Beadle APRN) - ph. [phone] fax. [phone]     Karolyn Messing M. Senie Lanese MSN, APRN, AGCNS-BC, AGPCNP-BC  Advanced Practice Provider  Colon and Rectal Surgery  Surgical Oncology  APP for Dr. Ashcraft, Dr. Martin, and Dr. Luka

## 2021-05-06 NOTE — Progress Notes
Date of Service: 05/06/2021    Subjective:             Cindy Randolph is a 60 y.o. female.    History of Present Illness  Cindy Randolph is a 60yo female with a history of breast cancer on chemotherapy who follows up ~6wks after starting nifedipine/lidocaine for anal fissure. She reports greater than 50% improvement in her symptoms. She is having no pain or bleeding. Working on avoiding constipation. Started taking 3 fiber gummies a day. This is helping. Rectocele still causing troubles. She describes bowels as like trying to birth a breech baby. Stopped using the nifedipine cream about 2-3wks ago. Has upcoming lymph node dissection for breast cancer followed by radiation.     03/16/2021 - Med Onc:  I,ve used the Prep H, baths and witch Hazel wipes, but the external hemorrhoid continues, and is frustrating. I?ve dealt with a much smaller, infrequent problem since my last couple of pregnancies but the increased irregularity since Chemo, and more irritated state  of the intestine means I?m chasing constipation vs very soft stool. I?m normally much more regular. It is not strangulated--that did happen once a decade ago and Prep H and sitz baths fixed it. But this is just stubborn. Is there a prescription ointment to switch to that Dr. Neil Crouch could prescribe? I?m past the recommended usage time of PrepH.  To the good- I?ve only needed Zofran or Compazine one a day, if that. Nausea is certainly better these last few days! Re: diet, I eat fruits and vegetables and some whole grains and legumes (little to no white flour/sugar) so I do get natural soluble and insoluble fiber.  Metamucil and or Miralax which used as directed when indicated have proved overrated in results (and make one more crampy).  This really is tiring and I could use one less very annoying consequence of Chemo.  I'm seeing Dr. Essie Christine today at [facility] for a scheduled F/U for my right knee. It still has swelling, & is sore at times. Dealing with so much. But again, in general, it?s much better than the first cycle.   I did shave my head when the hair came off in handfuls this week and I?m dealing well with that aspect of Chemo--at least! Getting walks and stationary bike sessions in!    2017 - Colonoscopy:    Medical History:   Diagnosis Date   ? Acquired hypothyroidism    ? Anxiety 2016    from pain   ? Back pain    ? Breast cancer in female [facility] 12/2020    Left   ? Cancer of thyroid (HCC) 1980   ? Degenerative disc disease, cervical 1994   ? Degenerative disc disease, lumbar 2010   ? Degenerative disc disease, thoracic 2010   ? Depression situational, transient    after divorce, after death of 2nd husband   ? Essential hypertension began in 3rd trimester most pregnancies    remained after last pregnancy   ? Essential hypertension, benign as above   ? Generalized headaches 1996-2010    probably hormonally related   ? Heart murmur at birth    Dr. Maisie Fus said he didn't hear it a few years ago   ? Infection    ? Joint pain 1994   ? Other malignant neoplasm without specification of site thyroid, 1980    thyroidectomy   ? Spinal stenosis 2017 to present    per MRIs   ? Thyroid disorder as  noted   ? Torn meniscus 12/25/2020   ? Ulcer 1987    with divorce; resolved   ? Unspecified deficiency anemia 2009    from excessive bleeding post-partum; treated iron     Surgical History:   Procedure Laterality Date   ? HX WRIST FRACTURE SURGERY  1993    Baker's thumb with fixation   ? ARTHROPLASTY  2001    L ACL   ? COLONOSCOPY  2017    normal   ? ARTHROSCOPY KNEE WITH PARTIAL LATERAL MENISCECTOMY AND LEFT KNEE INJECTION. Right 01/08/2021    Performed by Vopat, Lowry Ram, MD at [facility] OR   ? BILATERAL TOTAL MASTECTOMIES Bilateral 01/22/2021    Performed by Massie Kluver, MD at [facility] OR   ? INTRAOPERATIVE SENTINEL LYMPH NODE IDENTIFICATION WITH/ WITHOUT NON-RADIOACTIVE DYE INJECTION Left 01/22/2021    Performed by Massie Kluver, MD at [facility] OR   ? INJECTION RADIOACTIVE TRACER FOR SENTINEL NODE IDENTIFICATION Left 01/22/2021    Performed by Massie Kluver, MD at [facility] OR   ? LEFT AXILLARY SENTINEL LYMPH NODE BIOPSY Left 01/22/2021    Performed by Massie Kluver, MD at [facility] OR   ? BILATERAL CHEST FLAT CLOSURE Bilateral 01/22/2021    Performed by Erlene Quan, Zachary George, MD at [facility] OR   ? BILATERAL CHEST FLAT CLOSURE x 8 Bilateral 01/22/2021    Performed by Stevenson Clinch, MD at [facility] OR   ? EXCISION BENIGN LESION 0.5 CM OR LESS - TORSO Right 01/22/2021    Performed by Erlene Quan, Zachary George, MD at [facility] OR   ? TUNNELED VENOUS PORT PLACEMENT Right 02/19/2021   ? Placement of port-a-cath - 33F Right 02/19/2021    Performed by Freund, Alecia Lemming., MD at [facility] OR   ? FLUOROSCOPIC GUIDANCE CENTRAL VENOUS ACCESS DEVICE PLACEMENT/ REPLACEMENT/ REMOVAL N/A 02/19/2021    Performed by Flo Shanks, Alecia Lemming., MD at [facility] OR   ? KNEE SURGERY  L ACL as above   ? THYROIDECTOMY       Family History   Problem Relation Age of Onset   ? Arthritis Paternal Grandmother         (osteoarthritis) multiple heberdens noduls and deformities   ? Back pain Paternal Grandmother    ? Arthritis Mother         wear and tear   ? Back pain Mother    ? Hypertension Mother    ? Joint Pain Mother    ? Neck Pain Mother    ? Cancer-Breast Mother 40        was on HRT for 10-15 years   ? Basal Cell Carcinoma Brother    ? Back pain Brother    ? Hypertension Brother    ? Basal Cell Carcinoma Brother    ? Back pain Brother         has had surgery   ? Hypertension Brother    ? Basal Cell Carcinoma Brother    ? Back pain Brother    ? Hypertension Brother    ? Diabetes Father         Type 2   ? Heart problem Father         CABG 3   ? Heart Disease Father    ? Hypertension Father    ? Diabetes Maternal Grandfather         Type 2   ? Birth Defect Daughter  PRS, congenital diaphragmatic hernia, malrotation, grey matter heterotopia   ? Stroke Maternal Uncle    ? Thyroid Disease Maternal Uncle    ? Diabetes Paternal Grandfather    ? Birth Defect Nephew         craniosynostosis   ? Cancer-Breast Maternal Great-Aunt 58     Social History     Socioeconomic History   ? Marital status: Married   Occupational History   ? Occupation: retired Engineer, civil (consulting)   Tobacco Use   ? Smoking status: Never   ? Smokeless tobacco: Never   Vaping Use   ? Vaping Use: Never used   Substance and Sexual Activity   ? Alcohol use: Never     Comment: one per month beer   ? Drug use: Never     Comment: in late teens, early twenties, a few times and not since   ? Sexual activity: Yes     Partners: Male     Birth control/protection: Post-menopausal, None     Vaping/E-liquid Use   ? Vaping Use Never User         CBD   ? Product Type Oil    ? Route Oral    ? Current Frequency None    ? Indication Pain    ? Indication Comments Stopped 3 weeks ago              Review of Systems   Constitutional: Negative.  Negative for chills, fever and unexpected weight change.   HENT: Positive for postnasal drip and rhinorrhea.    Eyes: Negative.    Respiratory: Negative.    Cardiovascular: Negative.    Gastrointestinal: Negative.  Negative for abdominal distention, abdominal pain, anal bleeding, blood in stool, constipation, diarrhea, nausea, rectal pain and vomiting.   Endocrine: Negative.    Genitourinary: Negative.    Musculoskeletal: Positive for arthralgias, back pain and neck pain.   Skin: Negative.  Negative for color change and wound.   Allergic/Immunologic: Negative.    Neurological: Negative.  Negative for weakness and light-headedness.   Hematological: Negative.    Psychiatric/Behavioral: Negative.          Objective:         ? acetaminophen (TYLENOL) 325 mg tablet Take two tablets by mouth every 6 hours. Take scheduled for 3 days after surgery, then as needed. Do not exceed 4,000mg  in a 24 hour period.   ? cholecalciferol (vitamin D3) (VITAMIN D3 PO) Take  by mouth.   ? diazePAM (VALIUM) 2 mg tablet Take one tablet by mouth every 6 hours as needed for Anxiety.   ? diclofenac sodium DR (VOLTAREN) 50 mg tablet Take 50 mg by mouth twice daily. Take with food.   ? hydrocortisone (ANUSOL-HC) 2.5 % rectal cream APPLY RECTALLY TO THE AFFECTED AREA 2 TO 4 TIMES PER DAY AS NEEDED FOR HEMORRHOIDS   ? Lactobacillus acidophilus (PROBIOTIC) 10 billion cell cap Take  by mouth daily.   ? letrozole [facility]) 2.5 mg tablet Take one tablet by mouth daily. (Patient taking differently: Take 2.5 mg by mouth daily. will start after surgery)   ? levothyroxine (SYNTHROID) 100 mcg tablet Take 112 mcg by mouth daily 30 minutes before breakfast.   ? lidocaine/prilocaine (EMLA) 2.5/2.5 % topical cream Apply a thin film over port one hour prior to treatment. Do not rub in. Cover with plastic wrap after application   ? lisinopriL (ZESTRIL) 2.5 mg tablet Take 2.5 mg by mouth twice daily.   ? nifedipine 0.3%/lidocaine 1.5%/white  petrolatum(#) Apply a pea sized amount to the anal opening two to four times a day. Do NOT insert into anus.   ? omega 3-dha-epa-fish oil (FISH OIL) 100-160-1,000 mg cap Take  by mouth three times weekly.   ? ondansetron HCL (ZOFRAN) 8 mg tablet Take one tablet by mouth every 8 hours as needed (nausea and vomiting).   ? prochlorperazine maleate (COMPAZINE) 10 mg tablet TAKE 1 TABLET BY MOUTH EVERY 6 HOURS AS NEEDED FOR NAUSEA OR VOMITING   ? traMADoL (ULTRAM) 50 mg tablet Take one tablet by mouth every 8 hours as needed for Pain. Indications: pain   ? traMADoL (ULTRAM) 50 mg tablet Take 50 mg by mouth every 8 hours as needed for Pain. Takes as little as 12.5 mg tramadol for pain.   ? vitamins, B complex tab Take 1 tablet by mouth three times weekly.   ? vitamins, multiple cap Take 1 capsule by mouth daily.     Vitals:    05/06/21 1028   PainSc: Zero   Weight: 65.3 kg (144 lb)   Height: 158.8 cm (5' 2.52)     Body mass index is 25.9 kg/m?Marland Kitchen     Physical Exam  Vitals reviewed.   Constitutional:       General: She is not in acute distress.     Appearance: Normal appearance. She is well-developed.   HENT: Head: Normocephalic and atraumatic.      Nose: Nose normal.   Eyes:      General: Lids are normal.      Conjunctiva/sclera: Conjunctivae normal.   Pulmonary:      Effort: Pulmonary effort is normal. No respiratory distress.   Genitourinary:     Comments: External: Healing anterior fissure.  Musculoskeletal:         General: Normal range of motion.      Cervical back: Normal range of motion.   Neurological:      Mental Status: She is alert and oriented to person, place, and time.   Psychiatric:         Speech: Speech normal.         Behavior: Behavior normal.         Thought Content: Thought content normal.         Judgment: Judgment normal.              Assessment and Plan:  60yo female with breast cancer and healing anterior anal fissure.    Anal Fissure:  - Still present on exam today. Resume topical nifedipine/lidocaine 2-4x a day. Continue this until follow up.   - Avoid constipation.     Breast Cancer:  - Upcoming lymph node dissection. Long discussion with anticipated opioid needs and constipation.   - Advised taking a Colace or 1/2cap of Miralax when taking a dose of pain medication.   - Continue fiber gummies. Consider adding or switching to 1tsp of Benefiber a day.     Rectocele:  - Continued optimization of bowel habits.     Plan:  - RTC 6wks.     .The patient and family were allowed to ask questions and voice concerns; these were addressed to the best of our ability. They expressed understanding of what was explained to them, and they agreed with the present plan. Patient has the phone numbers for the Cancer Center and was instructed on how to contact us with any questions or concerns.    My collaborating provider on this patient is Dr. Deedra Ehrich MD.  Brunilda Payor. Fredricka Bonine MSN, APRN, AGCNS-BC, AGPCNP-BC  Advanced Practice Provider  Colon and Rectal Surgery  Surgical Oncology  APP for Dr. Kathee Delton, Dr. Daphine Deutscher and Dr. Daiva Nakayama  Pager: [phone]  Available via Amie Critchley and [facility]

## 2021-05-11 ENCOUNTER — Encounter: Admit: 2021-05-11 | Discharge: 2021-05-11 | Payer: MEDICARE

## 2021-05-14 ENCOUNTER — Encounter: Admit: 2021-05-14 | Discharge: 2021-05-14 | Payer: MEDICARE

## 2021-05-14 DIAGNOSIS — M4800 Spinal stenosis, site unspecified: Secondary | ICD-10-CM

## 2021-05-14 DIAGNOSIS — M5136 Other intervertebral disc degeneration, lumbar region: Secondary | ICD-10-CM

## 2021-05-14 DIAGNOSIS — F419 Anxiety disorder, unspecified: Secondary | ICD-10-CM

## 2021-05-14 DIAGNOSIS — IMO0002 Ulcer: Secondary | ICD-10-CM

## 2021-05-14 DIAGNOSIS — I10 Essential (primary) hypertension: Secondary | ICD-10-CM

## 2021-05-14 DIAGNOSIS — M5030 Other cervical disc degeneration, unspecified cervical region: Secondary | ICD-10-CM

## 2021-05-14 DIAGNOSIS — M2550 Pain in unspecified joint: Secondary | ICD-10-CM

## 2021-05-14 DIAGNOSIS — M5134 Other intervertebral disc degeneration, thoracic region: Secondary | ICD-10-CM

## 2021-05-14 DIAGNOSIS — E079 Disorder of thyroid, unspecified: Secondary | ICD-10-CM

## 2021-05-14 DIAGNOSIS — M549 Dorsalgia, unspecified: Secondary | ICD-10-CM

## 2021-05-14 DIAGNOSIS — C73 Malignant neoplasm of thyroid gland: Secondary | ICD-10-CM

## 2021-05-14 DIAGNOSIS — B999 Unspecified infectious disease: Secondary | ICD-10-CM

## 2021-05-14 DIAGNOSIS — R519 Generalized headaches: Secondary | ICD-10-CM

## 2021-05-14 DIAGNOSIS — D539 Nutritional anemia, unspecified: Secondary | ICD-10-CM

## 2021-05-14 DIAGNOSIS — E039 Hypothyroidism, unspecified: Secondary | ICD-10-CM

## 2021-05-14 DIAGNOSIS — F32A Depression: Secondary | ICD-10-CM

## 2021-05-14 DIAGNOSIS — C50919 Malignant neoplasm of unspecified site of unspecified female breast: Secondary | ICD-10-CM

## 2021-05-14 DIAGNOSIS — S83209A Unspecified tear of unspecified meniscus, current injury, unspecified knee, initial encounter: Secondary | ICD-10-CM

## 2021-05-14 DIAGNOSIS — R011 Cardiac murmur, unspecified: Secondary | ICD-10-CM

## 2021-05-14 DIAGNOSIS — C801 Malignant (primary) neoplasm, unspecified: Secondary | ICD-10-CM

## 2021-05-14 MED ORDER — PROPOFOL INJ 10 MG/ML IV VIAL
INTRAVENOUS | 0 refills | Status: DC
Start: 2021-05-14 — End: 2021-05-14
  Administered 2021-05-14: 18:00:00 150 mg via INTRAVENOUS

## 2021-05-14 MED ORDER — MIDAZOLAM 1 MG/ML IJ SOLN
INTRAVENOUS | 0 refills | Status: DC
Start: 2021-05-14 — End: 2021-05-14
  Administered 2021-05-14: 18:00:00 2 mg via INTRAVENOUS

## 2021-05-14 MED ORDER — PROPOFOL 10 MG/ML IV EMUL 20 ML (INFUSION)(AM)(OR)
INTRAVENOUS | 0 refills | Status: DC
Start: 2021-05-14 — End: 2021-05-14
  Administered 2021-05-14: 19:00:00 140 ug/kg/min via INTRAVENOUS

## 2021-05-14 MED ORDER — DEXAMETHASONE SODIUM PHOSPHATE 4 MG/ML IJ SOLN
INTRAVENOUS | 0 refills | Status: DC
Start: 2021-05-14 — End: 2021-05-14
  Administered 2021-05-14: 18:00:00 4 mg via INTRAVENOUS

## 2021-05-14 MED ORDER — ACETAMINOPHEN 1,000 MG/100 ML (10 MG/ML) IV SOLN
INTRAVENOUS | 0 refills | Status: DC
Start: 2021-05-14 — End: 2021-05-14
  Administered 2021-05-14: 19:00:00 1000 mg via INTRAVENOUS

## 2021-05-14 MED ORDER — LABETALOL 5 MG/ML IV SYRG
INTRAVENOUS | 0 refills | Status: DC
Start: 2021-05-14 — End: 2021-05-14
  Administered 2021-05-14 (×3): 5 mg via INTRAVENOUS

## 2021-05-14 MED ORDER — PROPOFOL 10 MG/ML IV EMUL 100 ML (INFUSION)(AM)(OR)
INTRAVENOUS | 0 refills | Status: DC
Start: 2021-05-14 — End: 2021-05-14
  Administered 2021-05-14: 18:00:00 200 ug/kg/min via INTRAVENOUS

## 2021-05-14 MED ORDER — FENTANYL CITRATE (PF) 50 MCG/ML IJ SOLN
INTRAVENOUS | 0 refills | Status: DC
Start: 2021-05-14 — End: 2021-05-14
  Administered 2021-05-14 (×2): 50 ug via INTRAVENOUS

## 2021-05-14 MED ORDER — ARTIFICIAL TEARS (PF) SINGLE DOSE DROPS GROUP
OPHTHALMIC | 0 refills | Status: DC
Start: 2021-05-14 — End: 2021-05-14
  Administered 2021-05-14: 18:00:00 2 [drp] via OPHTHALMIC

## 2021-05-14 MED ORDER — LIDOCAINE (PF) 200 MG/10 ML (2 %) IJ SYRG
INTRAVENOUS | 0 refills | Status: DC
Start: 2021-05-14 — End: 2021-05-14
  Administered 2021-05-14: 18:00:00 60 mg via INTRAVENOUS

## 2021-05-14 MED ORDER — ONDANSETRON HCL (PF) 4 MG/2 ML IJ SOLN
INTRAVENOUS | 0 refills | Status: DC
Start: 2021-05-14 — End: 2021-05-14
  Administered 2021-05-14: 19:00:00 4 mg via INTRAVENOUS

## 2021-05-14 MED ADMIN — TRAMADOL 50 MG PO TAB [14632]: 50 mg | ORAL | @ 20:00:00 | Stop: 2021-05-14 | NDC 68084080811

## 2021-05-14 MED ADMIN — DEXTROSE 5%-0.45% SODIUM CHLORIDE & POTASSIUM CHLORIDE 20 MEQ/L IV SOLP [14863]: 1000.000 mL | INTRAVENOUS | @ 21:00:00 | Stop: 2021-05-16 | NDC 00338067104

## 2021-05-14 MED ADMIN — FENTANYL CITRATE (PF) 50 MCG/ML IJ SOLN [3037]: 25 ug | INTRAVENOUS | @ 20:00:00 | Stop: 2021-05-14 | NDC 00409909412

## 2021-05-14 MED ADMIN — BUPIVACAINE HCL 0.25 % (2.5 MG/ML) IJ SOLN [1222]: 10 mL | INTRAMUSCULAR | @ 19:00:00 | Stop: 2021-05-14 | NDC 63323046557

## 2021-05-14 MED ADMIN — LACTATED RINGERS IV SOLP [4318]: 1000.000 mL | INTRAVENOUS | @ 16:00:00 | Stop: 2021-05-16 | NDC 00338011704

## 2021-05-14 MED ADMIN — CEFAZOLIN INJ 1GM IVP [210319]: 2 g | INTRAVENOUS | @ 18:00:00 | Stop: 2021-05-14 | NDC 60505614200

## 2021-05-14 MED ADMIN — DIAZEPAM 5 MG PO TAB [2405]: 5 mg | ORAL | @ 21:00:00 | NDC 51079028501

## 2021-05-14 MED ADMIN — OXYCODONE 5 MG PO TAB [10814]: 5 mg | ORAL | @ 23:00:00 | NDC 00904696661

## 2021-05-14 NOTE — Anesthesia Post-Procedure Evaluation
Post-Anesthesia Evaluation    Name: Cindy Randolph      MRN: [id number]     DOB: [birth year]     Age: 60 y.o.     Sex: female   __________________________________________________________________________     Procedure Information     Anesthesia Start Date/Time: 05/14/21 1149    Procedures:       Left Completion Axillary Lymph Node Dissection (Left: Axilla) - **Dr. Bea Graff case length: 1 hour      REMOVAL TUNNELED CENTRAL VENOUS ACCESS DEVICE INCLUDING PORT/ PUMP (Right: Chest)    Location: [facility] OR [facility] / [place] [facility] OR/PERIOP    Surgeons: Mathis Dad, MD          Post-Anesthesia Vitals  BP: 129/87 (12/22 1515)  Temp: 37 C (98.6 F) (12/22 1446)  Pulse: 63 (12/22 1515)  Respirations: 16 PER MINUTE (12/22 1515)  SpO2: 96 % (12/22 1515)  SpO2 Pulse: 69 (12/22 1515)  O2 Device: None (Room air) (12/22 1515)  Height: 157.5 cm (5\' 2" ) (12/22 0931)   Vitals Value Taken Time   BP 128/81 05/14/21 1430   Temp 37.2 C (99 F) 05/14/21 1430   Pulse 69 05/14/21 1430   Respirations 15 PER MINUTE 05/14/21 1430   SpO2 94 % 05/14/21 1430   O2 Device None (Room air) 05/14/21 1430   ABP     ART BP           Post Anesthesia Evaluation Note    Evaluation location: pre/post  Patient participation: recovered; patient participated in evaluation  Level of consciousness: alert    Pain score: 4  Pain management: adequate    Hydration: normovolemia  Temperature: 36.0C - 38.4C  Airway patency: adequate    Perioperative Events      Postoperative Status  Cardiovascular status: hemodynamically stable  Respiratory status: spontaneous ventilation        Perioperative Events  There were no known notable events for this encounter.

## 2021-05-15 ENCOUNTER — Encounter: Admit: 2021-05-15 | Discharge: 2021-05-15 | Payer: MEDICARE

## 2021-05-15 MED ADMIN — SENNOSIDES-DOCUSATE SODIUM 8.6-50 MG PO TAB [40926]: 1 | ORAL | @ 14:00:00 | Stop: 2021-05-15 | NDC 60687062211

## 2021-05-15 MED ADMIN — ACETAMINOPHEN 500 MG PO TAB [102]: 1000 mg | ORAL | @ 03:00:00 | NDC 00904673061

## 2021-05-15 MED ADMIN — SENNOSIDES-DOCUSATE SODIUM 8.6-50 MG PO TAB [40926]: 1 | ORAL | @ 03:00:00 | NDC 60687062211

## 2021-05-15 MED ADMIN — LEVOTHYROXINE 112 MCG PO TAB [10404]: 112 ug | ORAL | @ 11:00:00 | Stop: 2021-05-15 | NDC 00904695461

## 2021-05-15 MED ADMIN — ACETAMINOPHEN 500 MG PO TAB [102]: 1000 mg | ORAL | @ 14:00:00 | Stop: 2021-05-15 | NDC 00904673061

## 2021-05-15 MED ADMIN — ACETAMINOPHEN 500 MG PO TAB [102]: 1000 mg | ORAL | @ 07:00:00 | Stop: 2021-05-15 | NDC 00904673061

## 2021-05-15 MED ADMIN — OXYCODONE 5 MG PO TAB [10814]: 5 mg | ORAL | @ 03:00:00 | NDC 00904696661

## 2021-05-15 MED ADMIN — OXYCODONE 5 MG PO TAB [10814]: 5 mg | ORAL | @ 11:00:00 | Stop: 2021-05-15 | NDC 00904696661

## 2021-05-15 MED ADMIN — DIAZEPAM 5 MG PO TAB [2405]: 5 mg | ORAL | @ 07:00:00 | Stop: 2021-05-15 | NDC 51079028501

## 2021-05-15 MED FILL — TRAMADOL 50 MG PO TAB: 50 mg | ORAL | 4 days supply | Qty: 10 | Fill #1 | Status: AC

## 2021-05-16 ENCOUNTER — Encounter: Admit: 2021-05-16 | Discharge: 2021-05-16 | Payer: MEDICARE

## 2021-05-16 DIAGNOSIS — F419 Anxiety disorder, unspecified: Secondary | ICD-10-CM

## 2021-05-16 DIAGNOSIS — C801 Malignant (primary) neoplasm, unspecified: Secondary | ICD-10-CM

## 2021-05-16 DIAGNOSIS — IMO0002 Ulcer: Secondary | ICD-10-CM

## 2021-05-16 DIAGNOSIS — E079 Disorder of thyroid, unspecified: Secondary | ICD-10-CM

## 2021-05-16 DIAGNOSIS — E039 Hypothyroidism, unspecified: Secondary | ICD-10-CM

## 2021-05-16 DIAGNOSIS — B999 Unspecified infectious disease: Secondary | ICD-10-CM

## 2021-05-16 DIAGNOSIS — F32A Depression: Secondary | ICD-10-CM

## 2021-05-16 DIAGNOSIS — C73 Malignant neoplasm of thyroid gland: Secondary | ICD-10-CM

## 2021-05-16 DIAGNOSIS — M549 Dorsalgia, unspecified: Secondary | ICD-10-CM

## 2021-05-16 DIAGNOSIS — M5136 Other intervertebral disc degeneration, lumbar region: Secondary | ICD-10-CM

## 2021-05-16 DIAGNOSIS — M2550 Pain in unspecified joint: Secondary | ICD-10-CM

## 2021-05-16 DIAGNOSIS — M5134 Other intervertebral disc degeneration, thoracic region: Secondary | ICD-10-CM

## 2021-05-16 DIAGNOSIS — S83209A Unspecified tear of unspecified meniscus, current injury, unspecified knee, initial encounter: Secondary | ICD-10-CM

## 2021-05-16 DIAGNOSIS — C50919 Malignant neoplasm of unspecified site of unspecified female breast: Secondary | ICD-10-CM

## 2021-05-16 DIAGNOSIS — M4800 Spinal stenosis, site unspecified: Secondary | ICD-10-CM

## 2021-05-16 DIAGNOSIS — R519 Generalized headaches: Secondary | ICD-10-CM

## 2021-05-16 DIAGNOSIS — M5030 Other cervical disc degeneration, unspecified cervical region: Secondary | ICD-10-CM

## 2021-05-16 DIAGNOSIS — I10 Essential (primary) hypertension: Secondary | ICD-10-CM

## 2021-05-16 DIAGNOSIS — D539 Nutritional anemia, unspecified: Secondary | ICD-10-CM

## 2021-05-16 DIAGNOSIS — R011 Cardiac murmur, unspecified: Secondary | ICD-10-CM

## 2021-05-20 ENCOUNTER — Encounter: Admit: 2021-05-20 | Discharge: 2021-05-20 | Payer: MEDICARE

## 2021-05-20 DIAGNOSIS — C50112 Malignant neoplasm of central portion of left female breast: Secondary | ICD-10-CM

## 2021-05-20 NOTE — Progress Notes
Treatment Planning Note    Encounter Date: 05/20/2021 at 11:04 AM  Creation Date: 05/20/2021     Cindy Randolph "Cindy Randolph" Marcou is a 60 y.o. female patient that presents with Malignant neoplasm of central portion of left breast in female, estrogen receptor positive ([place]) [M08.022, Z17.0].      The patient will be treated with:     TX Technique: 3D Radiation Therapy; Breath Hold     Laterality: Left      Specific Area Treatment Site: L CW+ nodes    Number of Treatments: 30      Medical Imaging will be utilized to help ensure accuracy of positioning and field configuration.     I have personally formulated the patients clinical treatment plan.  A finalized treatment plan will be formulated, approved, and available in the patient's chart post simulation and pretreatment.     Special Treatment Required: No    Concurrent Chemo: No    There are no questions and answers to display.       This document is electronically approved by Hebert Soho, MD.

## 2021-05-22 ENCOUNTER — Encounter: Admit: 2021-05-22 | Discharge: 2021-05-22 | Payer: MEDICARE

## 2021-05-22 NOTE — Progress Notes
Assessed JP drain site, site within normal limits.  New chg dressing placed over JP site.  Surgical dressing left intact, no redness or swelling noted from underneath surgical dressing.  Patient is now rating her pain a 4, down from an 8-9 previously.  Instructed patient to notify our office if she notices any increase in redness, swelling, if her drain is not holding suction, fever, or any other issues or concerns. Reiterated that pain can come and go after axillary surgery and encouraged her to continue performing her stretching exercises as she is able.  Patient was appreciative and has contact information.

## 2021-05-22 NOTE — Telephone Encounter
Spoke with Cindy Randolph by phone. She will come in at 1100 today for further assessment of the drain. I have recommended use of NSAIDS and compression along with gentle stretching and movement. Advised that she stay well hydrated and increase protein intake to help slow fluid output. She stated understanding of all and was appreciative.

## 2021-05-23 ENCOUNTER — Encounter: Admit: 2021-05-23 | Discharge: 2021-05-23 | Payer: MEDICARE

## 2021-05-23 MED ORDER — DOXYCYCLINE HYCLATE 100 MG PO TAB
100 mg | ORAL_TABLET | Freq: Two times a day (BID) | ORAL | 0 refills | 8.00000 days | Status: AC
Start: 2021-05-23 — End: ?

## 2021-05-25 ENCOUNTER — Encounter: Admit: 2021-05-25 | Discharge: 2021-05-25 | Payer: MEDICARE

## 2021-05-25 NOTE — Telephone Encounter
Surgery Telephone Encounter    Cindy Randolph is a 61 y.o. 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 Overall symptoms have been improving since 12/31 with the antibiotics. Photos document decreasing redness, she reports pain has overall been improving with one episode of sharp pain last night that improved with valium. JP drainage is becoming less cloudy. Denies fever, chills, myalgias, nausea, vomiting, and changes in PO intake.    Assessment/Plan:     Cellulitis  1. Continue antibiotics   2. Maintain JP  3. Continue multimodal pain control.   4. Plan to be seen in clinic tomorrow vs Wednesday    Discussed with Dr. Obie Dredge Natale Thoma 8:39 AM   Pager: [phone]

## 2021-05-26 ENCOUNTER — Encounter: Admit: 2021-05-26 | Discharge: 2021-05-26 | Payer: MEDICARE

## 2021-05-26 DIAGNOSIS — D539 Nutritional anemia, unspecified: Secondary | ICD-10-CM

## 2021-05-26 DIAGNOSIS — C801 Malignant (primary) neoplasm, unspecified: Secondary | ICD-10-CM

## 2021-05-26 DIAGNOSIS — M4800 Spinal stenosis, site unspecified: Secondary | ICD-10-CM

## 2021-05-26 DIAGNOSIS — C73 Malignant neoplasm of thyroid gland: Secondary | ICD-10-CM

## 2021-05-26 DIAGNOSIS — R519 Generalized headaches: Secondary | ICD-10-CM

## 2021-05-26 DIAGNOSIS — IMO0002 Ulcer: Secondary | ICD-10-CM

## 2021-05-26 DIAGNOSIS — B999 Unspecified infectious disease: Secondary | ICD-10-CM

## 2021-05-26 DIAGNOSIS — F419 Anxiety disorder, unspecified: Secondary | ICD-10-CM

## 2021-05-26 DIAGNOSIS — R011 Cardiac murmur, unspecified: Secondary | ICD-10-CM

## 2021-05-26 DIAGNOSIS — M5030 Other cervical disc degeneration, unspecified cervical region: Secondary | ICD-10-CM

## 2021-05-26 DIAGNOSIS — M5134 Other intervertebral disc degeneration, thoracic region: Secondary | ICD-10-CM

## 2021-05-26 DIAGNOSIS — M5136 Other intervertebral disc degeneration, lumbar region: Secondary | ICD-10-CM

## 2021-05-26 DIAGNOSIS — I10 Essential (primary) hypertension: Secondary | ICD-10-CM

## 2021-05-26 DIAGNOSIS — C50919 Malignant neoplasm of unspecified site of unspecified female breast: Secondary | ICD-10-CM

## 2021-05-26 DIAGNOSIS — M549 Dorsalgia, unspecified: Secondary | ICD-10-CM

## 2021-05-26 DIAGNOSIS — E039 Hypothyroidism, unspecified: Secondary | ICD-10-CM

## 2021-05-26 DIAGNOSIS — M2550 Pain in unspecified joint: Secondary | ICD-10-CM

## 2021-05-26 DIAGNOSIS — E079 Disorder of thyroid, unspecified: Secondary | ICD-10-CM

## 2021-05-26 DIAGNOSIS — F32A Depression: Secondary | ICD-10-CM

## 2021-05-26 DIAGNOSIS — S83209A Unspecified tear of unspecified meniscus, current injury, unspecified knee, initial encounter: Secondary | ICD-10-CM

## 2021-05-26 NOTE — Progress Notes
HPI:  Patient is here for her postop followup. She has done well. Her incision is healing nicely. She will complete doxy tonight.  No further redness or abnormal drainage.  I have reviewed the pathology with her and supplied her with a copy. She has follow up with Dr Laurence Compton and Marylou Mccoy planned.  Her drain output in minimal.    PHYSICAL EXAM:    Breast:  left    Incision:  therabond removed; incision healing well     Erythema:  No    Ecchymosis:  No    Seroma:  No  RIGHT port site with dermabond intact; healing well     Upper Extremity:    ROM:  limited extension    Winged scapula: No    Lymphedema:  No    PATHOLOGY:  Final Diagnosis:     A. 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   The largest metastatic focus measures 1.0 cm.   No extranodal extension is identified.     Comment:   All metastatic foci are macrometastases and range in size from 0.25 cm to   1.0cm.         ASSESSMENT/PLAN:  S/P:  LEFT CAND and port removal for LEFT ILC s/p chemotherapy.  Patient is doing well - complete course of doxy tonight    Procedure: Drain Removal    The drain was removed with gentle traction and inspected and found to be intact.  The site was covered with gauze.    Drain Location: left axilla    Removal date: 01/23/1193    Complications: none    Removal Reason: Clinically Indicated     follow up with Dr Marylou Mccoy  follow up with Dr Laurence Compton  [facility] education   Rx bras/ prosthesis  We will see her as planned in 6 months with imaging, BIS     I have reviewed the distress thermometer and needs assessment, including physical, psychological, social, spiritual and behavioral needs.  The patient completed the assessment during this visit to identify psychosocial needs that may interfere with the patient's plan of care.  I have made the following referrals based on this assessment and discussion with the patient:  Need not indicated

## 2021-05-27 ENCOUNTER — Encounter: Admit: 2021-05-27 | Discharge: 2021-05-27 | Payer: MEDICARE

## 2021-05-27 DIAGNOSIS — M2550 Pain in unspecified joint: Secondary | ICD-10-CM

## 2021-05-27 DIAGNOSIS — E039 Hypothyroidism, unspecified: Secondary | ICD-10-CM

## 2021-05-27 DIAGNOSIS — R011 Cardiac murmur, unspecified: Secondary | ICD-10-CM

## 2021-05-27 DIAGNOSIS — B999 Unspecified infectious disease: Secondary | ICD-10-CM

## 2021-05-27 DIAGNOSIS — F419 Anxiety disorder, unspecified: Secondary | ICD-10-CM

## 2021-05-27 DIAGNOSIS — F32A Depression: Secondary | ICD-10-CM

## 2021-05-27 DIAGNOSIS — S83209A Unspecified tear of unspecified meniscus, current injury, unspecified knee, initial encounter: Secondary | ICD-10-CM

## 2021-05-27 DIAGNOSIS — C801 Malignant (primary) neoplasm, unspecified: Secondary | ICD-10-CM

## 2021-05-27 DIAGNOSIS — D539 Nutritional anemia, unspecified: Secondary | ICD-10-CM

## 2021-05-27 DIAGNOSIS — M5134 Other intervertebral disc degeneration, thoracic region: Secondary | ICD-10-CM

## 2021-05-27 DIAGNOSIS — M4800 Spinal stenosis, site unspecified: Secondary | ICD-10-CM

## 2021-05-27 DIAGNOSIS — IMO0002 Ulcer: Secondary | ICD-10-CM

## 2021-05-27 DIAGNOSIS — R519 Generalized headaches: Secondary | ICD-10-CM

## 2021-05-27 DIAGNOSIS — M5136 Other intervertebral disc degeneration, lumbar region: Secondary | ICD-10-CM

## 2021-05-27 DIAGNOSIS — I10 Essential (primary) hypertension: Secondary | ICD-10-CM

## 2021-05-27 DIAGNOSIS — C50112 Malignant neoplasm of central portion of left female breast: Secondary | ICD-10-CM

## 2021-05-27 DIAGNOSIS — C50919 Malignant neoplasm of unspecified site of unspecified female breast: Secondary | ICD-10-CM

## 2021-05-27 DIAGNOSIS — E079 Disorder of thyroid, unspecified: Secondary | ICD-10-CM

## 2021-05-27 DIAGNOSIS — M5030 Other cervical disc degeneration, unspecified cervical region: Secondary | ICD-10-CM

## 2021-05-27 DIAGNOSIS — M549 Dorsalgia, unspecified: Secondary | ICD-10-CM

## 2021-05-27 DIAGNOSIS — C73 Malignant neoplasm of thyroid gland: Secondary | ICD-10-CM

## 2021-05-27 NOTE — Progress Notes
Lymphedema Post-Operative Education     DATE:  05/27/2021    PATIENT NAME:   Cindy Randolph  DATE OF BIRTH:   1961/04/05  MRN:   [id number]      Cindy Randolph presents today for review of initial lymphedema education.     Medical Team:   Surgeon: Leveda Anna  Plastic Surgeon: Dayton Bailiff  Medical oncologist: Neil Crouch  Radiation oncologist: Haskel Khan    Surgical Procedure: Left completion ALND on 05/14/2021, Bilateral total mastectomy/Left SLNB with oncoplastic flat closure on 01/22/2021    BASELINE MEASUREMENTS  BIS = 2.0, Left   Hand Dominance:  right handed    Risk factors for the development of breast cancer treatment related lymphedema include:   1. Bilateral mastectomy  2. Left SLNB (5/5), Left ALND (6/14)  3. Radiation Therapy  4. Chemotherapy- adjuvant chemotherapy of dd AC from 10/3 to 04/06/2021    Lymphedema evaluation, teaching and care plan:     1. General review of lymphedema pathophysiology in breast cancer patients completed. No barriers to learning noted, patient willing and active participant. Very interested in learning about topic. Thoughtful questions demonstrated good insight into etiology and prevention.      2. Baseline L-Dex Bioimpedance test (SOZO) completed on 12/24/2020. Bilateral circumferential arm measurements utilizing a Juzo tape measure completed on 02/10/2021.      3. Signs and symptoms to watch for once initial symptoms from surgery have resolved reviewed including but not limited to swelling, aching, heaviness, fullness in at risk area. Symptoms related to cellulitis or DVT should be reported urgently to surgery or medical oncology team.     4. Risk Factors/Precautions:   ? Obesity/weight gain (BMI greater than 30). Optimal weight management is ideal for overall health. Exercise without restrictions may be resumed when approved by surgeon.   ? Cancer and cancer treatments including surgery, radiation therapy, and certain types of chemotherapy are risk factors for developing lymphedema. ? Precautions regarding meticulous skin care reviewed including keeping skin moisturized, protecting skin from burns and injuries, and reporting signs of infection. Avoidance of blood pressures and needlesticks including medication injections, finger sticks, lab draws, and tattoos on affected arm discussed.   ? Early intervention can often reverse or prevent further swelling. Interventions may include the use of a compression sleeve, at home self-massage, and stretching exercises.    Reviewed ALND status along with additional risk reduction recommendations, including the use of a compression sleeve for air travel, exercise, strenuous/lifting activities, and activities that require repetitive arm movements. Compression sleeve instructions discussed. Patient was provided with a prophylactic compression sleeve prescription at her initial education visit in 01/2021. She obtained a sleeve prior to her ALND surgery.     5. ROM stretching exercises reduce stiffness, reduce scar tissue, and promote lymphatic fluid movement. ROM stretching exercises are safe to start now. Post breast surgery stretching exercises in surgical guidebook encouraged.  Affected side crawl the wall stretch in both abduction and flexion discussed and demonstrated.    6. When to call the office: Unrelenting aching, heaviness, fullness or swelling in arm or upper chest on the affected side. Signs and symptoms related to cellulitis or DVT should be reported urgently to the surgery or medical oncology team.      7. Lymphedema surveillance: Plan close follow-up with BIS surveillance starting 4-5 months post-op. If no concerns or issues will continue with routine BIS surveillance only in coordination with routine surgical follow-ups. Provided patient with written handouts regarding covered material, including contact information should  questions or concerns arise.     Total time 25 minutes. Estimated counseling time 20 minutes regarding signs symptoms, risk factors, monitoring, when to contact us and follow up plan. Patient agreeable to follow up plan.

## 2021-05-29 ENCOUNTER — Encounter: Admit: 2021-05-29 | Discharge: 2021-05-29 | Payer: MEDICARE

## 2021-05-29 ENCOUNTER — Ambulatory Visit: Admit: 2021-05-29 | Discharge: 2021-05-29 | Payer: MEDICARE

## 2021-05-29 DIAGNOSIS — C50112 Malignant neoplasm of central portion of left female breast: Secondary | ICD-10-CM

## 2021-05-29 NOTE — Progress Notes
Simulation Procedure Note    Date: 05/29/2021   Cindy Randolph is a 61 y.o. female.     There were no encounter diagnoses.  Anatomical Site: L CW+ nodes    On 05/29/2021,  Cindy Randolph underwent 3-DIMENSIONAL RADIATION THERAPY and BREATH HOLD  simulation procedure on the CT Simulator.      Informed consent has been obtained for the radiation oncology treatment.     The thoracic region was scanned in order to establish treatment fields for radiation therapy and is medically necessary to establish the treatment fields to the L CW+ nodes.    During the simulation process Cindy Randolph was setup in the supine position and a customized wingboard immobilization device was used.   No contrast agent(s) were administered under my supervision to help visualize the n/a.    Axial CT images were acquired through the region of interest in order to establish the appropriate isocenter.  These images will be transferred to the treatment planning system in order to identify the dose volumes for the targeted treatment area and for delineation of the normal critical structures, including the heart, lungs, spinal cord  which are in close proximity of the targeted area of treatment.                   Hebert Soho, MD

## 2021-05-29 NOTE — Research Notes
NCT Number:  [id number]  Phase of study: II  Expected treatment start date:   Outpatient or Inpatient: Outpatient     Study Coordinator: Carlyon Prows  Ordering Physician: Dr. Dorothyann Peng  Diagnosis: Malignant neoplasm of left breast in female, estrogen receptor positive ([place])  Diagnosis Code: Code: C50.912        Patient plans to participate in clinical trial: Hypofractionated radiation therapy for patients with breast cancer receiving regional nodal irradiation.     Drug/Dose/Frequency/Cycles:  Hypofractionation (Daily Monday through Friday for 4 Weeks - 20 fractions)    Sub-Arm A: Chest Wall (for patients who have undergone mastectomy) 4005 cGy / 15 fractions / 267 cGy each fraction to chest wall and regional nodes    Sub-Arm B: - Breast (for patients who have undergone lumpectomy) 4005 cGy / 15 fractions / 267 cGy each fraction to breast and regional nodes    BOTH Sub-Arms: 1000 cGy boost/5 fractions/200 cGy each to lumpectomy bed / scar / undissected involved nodes. (This will start after the completion of the 4005 cGy listed above)    Premeds: N/A  Potential screening procedures: N/A    Radiation Therapy will be billed to insurance

## 2021-05-29 NOTE — Research Notes
Study Title: Hypofractionated Radiation Therapy for Patients with Breast Cancer Receiving Regional Nodal Irradiation  HSC: [id number]  Consent Version Date: 01/29/2021-01/28/2022        Clinical trial participation and research nature of the trial were discussed with the participant Bonnell Placzek. Donzetta Sprung, by this coordinator during this visit. Participant was alert and oriented during consent discussion. Participant was informed that clinical trial is voluntary and she may withdraw consent at any time for any reason by notifying study team. Study purpose, procedures, tests, samples to be obtained, potential side effects, benefits, foreseeable risks and duration of study were discussed. As she is of postmenopausal status, pregnancy risks were not discussed. Participant was informed study participation can be terminated due to disease progression, participant withdrawal, unanticipated toxicity, investigator discretion, sponsor or FDA. HIPAA information, compensation and insurance pre-certification were discussed per consent form. An appointment with the financial counselor will be scheduled for the participant. Alternative courses of treatment were also discussed per consent. Participant verbalized understanding.      Participant was given time to review the consent form and to discuss participation in this study. Questions asked were answered to her satisfaction and participant voiced desire to sign consent today. Participant signed consent without coercion and undue influence. A copy of the signed consent was given to the participant and emailed to [employer] [employer]) for scanning into the participant's medical record in [employer]. Contact information for the study team was given to the participant.     No research specific procedures took place prior to consenting.    [place]

## 2021-06-02 ENCOUNTER — Encounter: Admit: 2021-06-02 | Discharge: 2021-06-02 | Payer: MEDICARE

## 2021-06-02 MED ORDER — MUPIROCIN 2 % TP OINT
Freq: Two times a day (BID) | TOPICAL | 1 refills | 11.00000 days | Status: AC
Start: 2021-06-02 — End: ?

## 2021-06-03 ENCOUNTER — Encounter: Admit: 2021-06-03 | Discharge: 2021-06-03 | Payer: MEDICARE

## 2021-06-03 DIAGNOSIS — F419 Anxiety disorder, unspecified: Secondary | ICD-10-CM

## 2021-06-03 DIAGNOSIS — M5136 Other intervertebral disc degeneration, lumbar region: Secondary | ICD-10-CM

## 2021-06-03 DIAGNOSIS — E079 Disorder of thyroid, unspecified: Secondary | ICD-10-CM

## 2021-06-03 DIAGNOSIS — E039 Hypothyroidism, unspecified: Secondary | ICD-10-CM

## 2021-06-03 DIAGNOSIS — C50919 Malignant neoplasm of unspecified site of unspecified female breast: Secondary | ICD-10-CM

## 2021-06-03 DIAGNOSIS — M5134 Other intervertebral disc degeneration, thoracic region: Secondary | ICD-10-CM

## 2021-06-03 DIAGNOSIS — S83209A Unspecified tear of unspecified meniscus, current injury, unspecified knee, initial encounter: Secondary | ICD-10-CM

## 2021-06-03 DIAGNOSIS — D539 Nutritional anemia, unspecified: Secondary | ICD-10-CM

## 2021-06-03 DIAGNOSIS — F32A Depression: Secondary | ICD-10-CM

## 2021-06-03 DIAGNOSIS — I10 Essential (primary) hypertension: Secondary | ICD-10-CM

## 2021-06-03 DIAGNOSIS — C73 Malignant neoplasm of thyroid gland: Secondary | ICD-10-CM

## 2021-06-03 DIAGNOSIS — M4800 Spinal stenosis, site unspecified: Secondary | ICD-10-CM

## 2021-06-03 DIAGNOSIS — C50112 Malignant neoplasm of central portion of left female breast: Secondary | ICD-10-CM

## 2021-06-03 DIAGNOSIS — M5030 Other cervical disc degeneration, unspecified cervical region: Secondary | ICD-10-CM

## 2021-06-03 DIAGNOSIS — M549 Dorsalgia, unspecified: Secondary | ICD-10-CM

## 2021-06-03 DIAGNOSIS — R011 Cardiac murmur, unspecified: Secondary | ICD-10-CM

## 2021-06-03 DIAGNOSIS — B999 Unspecified infectious disease: Secondary | ICD-10-CM

## 2021-06-03 DIAGNOSIS — R519 Generalized headaches: Secondary | ICD-10-CM

## 2021-06-03 DIAGNOSIS — IMO0002 Ulcer: Secondary | ICD-10-CM

## 2021-06-03 DIAGNOSIS — M2550 Pain in unspecified joint: Secondary | ICD-10-CM

## 2021-06-03 DIAGNOSIS — C801 Malignant (primary) neoplasm, unspecified: Secondary | ICD-10-CM

## 2021-06-03 NOTE — Progress Notes
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  Cindy Randolph returns to clinic today for evaluation of surgical incision.  She continues to notice a small area of erythema and is concerned about it resolving prior to radiation therapy.  No systemic concerns; no f/c; no drainage from the incision    PHYSICAL EXAM:    Breast:  left    Incision:  C/d/i; Small suture granulomas with focal edema at incision with small area of erythema laterally.  No sign of fluid collection.   Erythema localized to where bandage had been placed.      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    We discussed that skin changes are consistent with benign suture granuloma and there are no signs of infection on exam today.  We recommended that she stop wearing tight compression or putting dressing over this area.  She should continue using Bactroban but leave open to air wearing loose fitting clothing.  She will return to clinic as scheduled.  She was given ample time to ask questions all of which were answered to her satisfaction. She was encouraged to call with any interval questions or concerns.     1. Continue following with Dr. Laurence Compton  2. Continue following with Dr. Marylou Mccoy  3. Return to clinic as scheduled.     Merlyn Albert, PA-C    ATTESTATION    I personally performed the key portions of the E/M visit, discussed case with Physician Assistant and concur with documentation of history, physical exam, assessment, and treatment plan unless otherwise noted.     patient doing well   focal irritation of the incision; suture granuloma; focal edema/healing ridge  continue topical abx  no compression/no tape to the area   ok to start radiation next week as planned     Staff name:  Mathis Dad, MD Date of Service:  06/03/2021

## 2021-06-08 ENCOUNTER — Encounter: Admit: 2021-06-08 | Discharge: 2021-06-08 | Payer: MEDICARE

## 2021-06-10 ENCOUNTER — Encounter: Admit: 2021-06-10 | Discharge: 2021-06-10 | Payer: MEDICARE

## 2021-06-10 ENCOUNTER — Ambulatory Visit: Admit: 2021-06-10 | Discharge: 2021-06-10 | Payer: MEDICARE

## 2021-06-10 DIAGNOSIS — C73 Malignant neoplasm of thyroid gland: Secondary | ICD-10-CM

## 2021-06-10 DIAGNOSIS — M4800 Spinal stenosis, site unspecified: Secondary | ICD-10-CM

## 2021-06-10 DIAGNOSIS — C50112 Malignant neoplasm of central portion of left female breast: Secondary | ICD-10-CM

## 2021-06-10 DIAGNOSIS — E079 Disorder of thyroid, unspecified: Secondary | ICD-10-CM

## 2021-06-10 DIAGNOSIS — S83209A Unspecified tear of unspecified meniscus, current injury, unspecified knee, initial encounter: Secondary | ICD-10-CM

## 2021-06-10 DIAGNOSIS — R519 Generalized headaches: Secondary | ICD-10-CM

## 2021-06-10 DIAGNOSIS — I10 Essential (primary) hypertension: Secondary | ICD-10-CM

## 2021-06-10 DIAGNOSIS — D539 Nutritional anemia, unspecified: Secondary | ICD-10-CM

## 2021-06-10 DIAGNOSIS — F419 Anxiety disorder, unspecified: Secondary | ICD-10-CM

## 2021-06-10 DIAGNOSIS — B999 Unspecified infectious disease: Secondary | ICD-10-CM

## 2021-06-10 DIAGNOSIS — IMO0002 Ulcer: Secondary | ICD-10-CM

## 2021-06-10 DIAGNOSIS — M5134 Other intervertebral disc degeneration, thoracic region: Secondary | ICD-10-CM

## 2021-06-10 DIAGNOSIS — M5136 Other intervertebral disc degeneration, lumbar region: Secondary | ICD-10-CM

## 2021-06-10 DIAGNOSIS — C801 Malignant (primary) neoplasm, unspecified: Secondary | ICD-10-CM

## 2021-06-10 DIAGNOSIS — C50919 Malignant neoplasm of unspecified site of unspecified female breast: Secondary | ICD-10-CM

## 2021-06-10 DIAGNOSIS — L987 Excessive and redundant skin and subcutaneous tissue: Secondary | ICD-10-CM

## 2021-06-10 DIAGNOSIS — M5030 Other cervical disc degeneration, unspecified cervical region: Secondary | ICD-10-CM

## 2021-06-10 DIAGNOSIS — M549 Dorsalgia, unspecified: Secondary | ICD-10-CM

## 2021-06-10 DIAGNOSIS — R011 Cardiac murmur, unspecified: Secondary | ICD-10-CM

## 2021-06-10 DIAGNOSIS — M2550 Pain in unspecified joint: Secondary | ICD-10-CM

## 2021-06-10 DIAGNOSIS — E039 Hypothyroidism, unspecified: Secondary | ICD-10-CM

## 2021-06-10 DIAGNOSIS — F32A Depression: Secondary | ICD-10-CM

## 2021-06-10 NOTE — Progress Notes
Subjective:       History of Present Illness  Cindy Randolph is a 61 y.o. female.    09.01.22 s/p bilateral total mastectomy with closure (length of closure is 23 cm on the right and 22 cm on the left, total length of closure is 45 cm)    Here today for follow up after completion of chemo  She is scheduled to start radiation today  States she had a cellulitis infection after completion of her axillary dissection in December 2022. She had reached out to [facility] office and was prescribed doxycycline, which she has completed today and applying Bactroban to the area  She is considering possible fat grafting surgery in the future           Review of Systems   Constitutional: Negative.    HENT: Negative.    Eyes: Negative.    Respiratory: Negative.    Cardiovascular: Negative.    Gastrointestinal: Negative.    Endocrine: Negative.    Genitourinary: Negative.    Musculoskeletal: Negative.    Skin: Negative.    Allergic/Immunologic: Negative.    Neurological: Negative.    Hematological: Negative.    Psychiatric/Behavioral: Negative.    All other systems reviewed and are negative.        Objective:         ? acetaminophen (TYLENOL) 325 mg tablet Take two tablets by mouth every 6 hours. Take scheduled for 3 days after surgery, then as needed. Do not exceed 4,000mg  in a 24 hour period.   ? cholecalciferol (vitamin D3) (VITAMIN D3 PO) Take  by mouth.   ? letrozole (FEMARA) 2.5 mg tablet Take one tablet by mouth daily. (Patient taking differently: Take 2.5 mg by mouth daily. will start after surgery)   ? levothyroxine (SYNTHROID) 112 mcg tablet Take 112 mcg by mouth daily 30 minutes before breakfast.   ? lisinopriL (ZESTRIL) 2.5 mg tablet Take 2.5 mg by mouth twice daily.   ? mupirocin (CENTANY) 2 % topical ointment Apply  topically to affected area twice daily.   ? nifedipine 0.3%/lidocaine 1.5%/white petrolatum(#) Apply a pea sized amount to the anal opening two to four times a day. Do NOT insert into anus.   ? vitamins, B complex tab Take 1 tablet by mouth three times weekly.   ? vitamins, multiple cap Take 1 capsule by mouth daily.     Vitals:    06/10/21 1139   BP: 120/81   Pulse: 80   Temp: 36.3 ?C (97.3 ?F)   TempSrc: Skin   PainSc: Four   Weight: 63.9 kg (140 lb 12.8 oz)   Height: 158.8 cm (5' 2.5)     Body mass index is 25.34 kg/m?Marland Kitchen     Physical Exam  Vitals reviewed.   Pulmonary:      Effort: Pulmonary effort is normal.   Chest:      Comments: Bilateral mastectomy with no reconstruction. Incisions clean dry and intact. No s/s infection or fluid collections. Residual localized erythema to left lateral incision, no s/s infection or fluid collections, edema improved in comparison to previous photos on file        Skin:     General: Skin is warm and dry.   Neurological:      Mental Status: She is alert and oriented to person, place, and time.              Assessment and Plan:  09.01.22 s/p bilateral total mastectomy (Dr.Balanoff) with closure (length of closure  is 23 cm on the right and 22 cm on the left, total length of closure is 45 cm)    Here today for postop, doing well  She starts radiation today. She is able to start radiation from plastic surgery standpoint. This has improved since she last saw Dr.Balanoff. Will continue to monitor. She may want to have fat grafting surgery after completion of radiation. We will plan on seeing her back after completion of radiation and discuss surgical options with Dr.DeSouza at that time. She was happy with the follow up plan    Photos today      Total time spent 30 minutes, of which greater than 50% was spent on counseling of care      Cyndi Bender, APRN-NP

## 2021-06-10 NOTE — Progress Notes
Subjective:       History of Present Illness  Cindy Randolph is a 61 y.o. female.       Review of Systems   Constitutional: Negative.    HENT: Negative.    Eyes: Negative.    Respiratory: Negative.    Cardiovascular: Negative.    Gastrointestinal: Positive for abdominal pain.   Endocrine: Negative.    Genitourinary: Negative.    Musculoskeletal: Negative.    Skin: Negative.    Allergic/Immunologic: Negative.    Neurological: Negative.    Hematological: Negative.    Psychiatric/Behavioral: Negative.          Objective:          acetaminophen (TYLENOL) 325 mg tablet Take two tablets by mouth every 6 hours. Take scheduled for 3 days after surgery, then as needed. Do not exceed 4,000mg  in a 24 hour period.    cholecalciferol (vitamin D3) (VITAMIN D3 PO) Take  by mouth.    letrozole [facility]) 2.5 mg tablet Take one tablet by mouth daily. (Patient taking differently: Take 2.5 mg by mouth daily. will start after surgery)    levothyroxine (SYNTHROID) 112 mcg tablet Take 112 mcg by mouth daily 30 minutes before breakfast.    lisinopriL (ZESTRIL) 2.5 mg tablet Take 2.5 mg by mouth twice daily.    mupirocin (CENTANY) 2 % topical ointment Apply  topically to affected area twice daily.    nifedipine 0.3%/lidocaine 1.5%/white petrolatum(#) Apply a pea sized amount to the anal opening two to four times a day. Do NOT insert into anus.    vitamins, B complex tab Take 1 tablet by mouth three times weekly.    vitamins, multiple cap Take 1 capsule by mouth daily.     Vitals:    06/10/21 1139   PainSc: Four     There is no height or weight on file to calculate BMI.     Physical Exam         Assessment and Plan:

## 2021-06-11 ENCOUNTER — Ambulatory Visit: Admit: 2021-06-11 | Discharge: 2021-06-11 | Payer: MEDICARE

## 2021-06-11 ENCOUNTER — Encounter: Admit: 2021-06-11 | Discharge: 2021-06-11 | Payer: MEDICARE

## 2021-06-12 ENCOUNTER — Ambulatory Visit: Admit: 2021-06-12 | Discharge: 2021-06-12 | Payer: MEDICARE

## 2021-06-12 ENCOUNTER — Encounter: Admit: 2021-06-12 | Discharge: 2021-06-12 | Payer: MEDICARE

## 2021-06-12 MED ORDER — SILVER SULFADIAZINE 1 % TP CREA
Freq: Four times a day (QID) | TOPICAL | 3 refills | 10.00000 days | Status: AC | PRN
Start: 2021-06-12 — End: ?

## 2021-06-15 ENCOUNTER — Encounter: Admit: 2021-06-15 | Discharge: 2021-06-15 | Payer: MEDICARE

## 2021-06-15 ENCOUNTER — Ambulatory Visit: Admit: 2021-06-15 | Discharge: 2021-06-15 | Payer: MEDICARE

## 2021-06-16 ENCOUNTER — Encounter: Admit: 2021-06-16 | Discharge: 2021-06-16 | Payer: MEDICARE

## 2021-06-16 ENCOUNTER — Ambulatory Visit: Admit: 2021-06-16 | Discharge: 2021-06-16 | Payer: MEDICARE

## 2021-06-16 DIAGNOSIS — I10 Essential (primary) hypertension: Secondary | ICD-10-CM

## 2021-06-16 DIAGNOSIS — M2550 Pain in unspecified joint: Secondary | ICD-10-CM

## 2021-06-16 DIAGNOSIS — B999 Unspecified infectious disease: Secondary | ICD-10-CM

## 2021-06-16 DIAGNOSIS — F32A Depression: Secondary | ICD-10-CM

## 2021-06-16 DIAGNOSIS — M5134 Other intervertebral disc degeneration, thoracic region: Secondary | ICD-10-CM

## 2021-06-16 DIAGNOSIS — E039 Hypothyroidism, unspecified: Secondary | ICD-10-CM

## 2021-06-16 DIAGNOSIS — C801 Malignant (primary) neoplasm, unspecified: Secondary | ICD-10-CM

## 2021-06-16 DIAGNOSIS — M5136 Other intervertebral disc degeneration, lumbar region: Secondary | ICD-10-CM

## 2021-06-16 DIAGNOSIS — C50112 Malignant neoplasm of central portion of left female breast: Secondary | ICD-10-CM

## 2021-06-16 DIAGNOSIS — R011 Cardiac murmur, unspecified: Secondary | ICD-10-CM

## 2021-06-16 DIAGNOSIS — M4800 Spinal stenosis, site unspecified: Secondary | ICD-10-CM

## 2021-06-16 DIAGNOSIS — F419 Anxiety disorder, unspecified: Secondary | ICD-10-CM

## 2021-06-16 DIAGNOSIS — C50919 Malignant neoplasm of unspecified site of unspecified female breast: Secondary | ICD-10-CM

## 2021-06-16 DIAGNOSIS — M5030 Other cervical disc degeneration, unspecified cervical region: Secondary | ICD-10-CM

## 2021-06-16 DIAGNOSIS — C73 Malignant neoplasm of thyroid gland: Secondary | ICD-10-CM

## 2021-06-16 DIAGNOSIS — E079 Disorder of thyroid, unspecified: Secondary | ICD-10-CM

## 2021-06-16 DIAGNOSIS — D539 Nutritional anemia, unspecified: Secondary | ICD-10-CM

## 2021-06-16 DIAGNOSIS — S83209A Unspecified tear of unspecified meniscus, current injury, unspecified knee, initial encounter: Secondary | ICD-10-CM

## 2021-06-16 DIAGNOSIS — IMO0002 Ulcer: Secondary | ICD-10-CM

## 2021-06-16 DIAGNOSIS — R519 Generalized headaches: Secondary | ICD-10-CM

## 2021-06-16 DIAGNOSIS — M549 Dorsalgia, unspecified: Secondary | ICD-10-CM

## 2021-06-16 NOTE — Progress Notes
Weekly Management Progress Note    Date: 06/16/2021    Cindy Randolph is a 61 y.o. female.     Vitals:  Vitals:    06/16/21 1040 06/16/21 1042 06/16/21 1043   BP: 129/81     BP Source: Arm, Right Upper Arm, Right Upper    Pulse: 60     Temp: 36.8 ?C (98.2 ?F)     Resp: 16     SpO2: 100%     O2 Device:  None (Room air)    TempSrc: Oral Oral    PainSc: Zero  Zero   Weight: 64.9 kg (143 lb)     Height: 158.8 cm (5' 2.52) 158.8 cm (5' 2.52)      Subjective   There were no encounter diagnoses.  Staging: Cancer Staging  Malignant neoplasm of left breast in female, estrogen receptor positive ([facility])  Staging form: Breast, AJCC 8th Edition  - Clinical stage from 12/22/2020: Stage IA (cT1c, cN0, cM0, G1, ER+, PR+, HER2-) - Signed by Massie Kluver, MD on 12/22/2020  - Pathologic stage from 02/02/2021: Stage IIIA (pT3, pN3, cM0, G1, ER+, PR+, HER2-) - Signed by Massie Kluver, MD on 05/27/2021      Body mass index is 25.72 kg/m?Marland Kitchen  Pain Score: Zero     Fatigue Scale: 6    Treatment Data Summary:      Treatment Data 06/11/2021 06/12/2021 06/12/2021 06/15/2021 06/15/2021 06/16/2021 06/16/2021   Course ID - C1-Left CW - C1-Left CW - C1-Left CW -   Plan ID Lt Sclav-FiF Lt CW-FiF Lt Sclav-FiF Lt CW-FiF Lt Sclav-FiF Lt CW-FiF Lt Sclav-FiF   Prescription Dose (cGy) 4,256 4,256 4,256 4,256 4,256 4,256 4,256   Prescribed Dose per Fraction (Gy) 2.66 2.66 2.66 2.66 2.66 2.66 2.66   Fractions Treated to Date 2 3 3 4 4 5 5    Total Fractions on Plan 16 16 16 16 16 16 16    Treatment Elapsed Days - 2 - 5 - 6 -   Reference Point ID Lt Sclav Lt CW Lt Sclav Lt CW Lt Sclav Lt CW Lt Sclav   Dosage Given to Date (Gy) 5.32 7.98 7.98 10.64 10.64 13.3 13.3        Subjective: Patient is status post 5 out of 16 treatments.  She has mild fatigue but denies odynophagia, pruritus or soreness. She has been having a rash over her left eye lid which may be related to gluten sensitivity.      Objective: Images have been checked.       Assessment: Tolerating radiation treatment well.     Plan: Continue radiation without changes. I will send over prescription for 2.5% hydrocortisone cream in case she develops indications. She already has the SSD cream. I will refer her to [place] dermatology, but she will also contact her PCP for the rash.                Bosie Helper MD  Radiation Oncology  [address]  [place], [place] [address]  Clinic: [phone]

## 2021-06-17 ENCOUNTER — Ambulatory Visit: Admit: 2021-06-17 | Discharge: 2021-06-17 | Payer: MEDICARE

## 2021-06-17 ENCOUNTER — Encounter: Admit: 2021-06-17 | Discharge: 2021-06-17 | Payer: MEDICARE

## 2021-06-17 DIAGNOSIS — B999 Unspecified infectious disease: Secondary | ICD-10-CM

## 2021-06-17 DIAGNOSIS — C50919 Malignant neoplasm of unspecified site of unspecified female breast: Secondary | ICD-10-CM

## 2021-06-17 DIAGNOSIS — M2550 Pain in unspecified joint: Secondary | ICD-10-CM

## 2021-06-17 DIAGNOSIS — E039 Hypothyroidism, unspecified: Secondary | ICD-10-CM

## 2021-06-17 DIAGNOSIS — C801 Malignant (primary) neoplasm, unspecified: Secondary | ICD-10-CM

## 2021-06-17 DIAGNOSIS — M549 Dorsalgia, unspecified: Secondary | ICD-10-CM

## 2021-06-17 DIAGNOSIS — F32A Depression: Secondary | ICD-10-CM

## 2021-06-17 DIAGNOSIS — M4800 Spinal stenosis, site unspecified: Secondary | ICD-10-CM

## 2021-06-17 DIAGNOSIS — S83209A Unspecified tear of unspecified meniscus, current injury, unspecified knee, initial encounter: Secondary | ICD-10-CM

## 2021-06-17 DIAGNOSIS — M5030 Other cervical disc degeneration, unspecified cervical region: Secondary | ICD-10-CM

## 2021-06-17 DIAGNOSIS — C50112 Malignant neoplasm of central portion of left female breast: Secondary | ICD-10-CM

## 2021-06-17 DIAGNOSIS — M5134 Other intervertebral disc degeneration, thoracic region: Secondary | ICD-10-CM

## 2021-06-17 DIAGNOSIS — IMO0002 Ulcer: Secondary | ICD-10-CM

## 2021-06-17 DIAGNOSIS — R011 Cardiac murmur, unspecified: Secondary | ICD-10-CM

## 2021-06-17 DIAGNOSIS — I10 Essential (primary) hypertension: Secondary | ICD-10-CM

## 2021-06-17 DIAGNOSIS — D539 Nutritional anemia, unspecified: Secondary | ICD-10-CM

## 2021-06-17 DIAGNOSIS — F419 Anxiety disorder, unspecified: Secondary | ICD-10-CM

## 2021-06-17 DIAGNOSIS — M5136 Other intervertebral disc degeneration, lumbar region: Secondary | ICD-10-CM

## 2021-06-17 DIAGNOSIS — E079 Disorder of thyroid, unspecified: Secondary | ICD-10-CM

## 2021-06-17 DIAGNOSIS — C73 Malignant neoplasm of thyroid gland: Secondary | ICD-10-CM

## 2021-06-17 DIAGNOSIS — R519 Generalized headaches: Secondary | ICD-10-CM

## 2021-06-17 NOTE — Progress Notes
BREAST RADIATION ONCOLOGY WEEKLY PROGRESS NOTE    Date: 06/17/2021      Vitals:  Vitals:    06/17/21 1030 06/17/21 1033   BP: (!) 116/92    BP Source: Arm, Right Upper    Pulse: 76    Resp: 16    SpO2: 100%    PainSc:  One   Weight: 63.5 kg (140 lb)      Subjective   The encounter diagnosis was Malignant neoplasm of central portion of left breast in female, estrogen receptor positive ([facility]).  Staging: Cancer Staging  Malignant neoplasm of left breast in female, estrogen receptor positive ([facility])  Staging form: Breast, AJCC 8th Edition  - Clinical stage from 12/22/2020: Stage IA (cT1c, cN0, cM0, G1, ER+, PR+, HER2-) - Signed by Massie Kluver, MD on 12/22/2020  - Pathologic stage from 02/02/2021: Stage IIIA (pT3, pN3, cM0, G1, ER+, PR+, HER2-) - Signed by Massie Kluver, MD on 05/27/2021    Body mass index is 25.18 kg/m?Marland Kitchen  Pain Score: One     Fatigue Scale: 5    Treatment Data Summary:      Treatment Data 06/12/2021 06/15/2021 06/15/2021 06/16/2021 06/16/2021 06/17/2021 06/17/2021   Course ID - C1-Left CW - C1-Left CW - C1-Left CW -   Plan ID Lt Sclav-FiF Lt CW-FiF Lt Sclav-FiF Lt CW-FiF Lt Sclav-FiF Lt CW-FiF Lt Sclav-FiF   Prescription Dose (cGy) 4,256 4,256 4,256 4,256 4,256 4,256 4,256   Prescribed Dose per Fraction (Gy) 2.66 2.66 2.66 2.66 2.66 2.66 2.66   Fractions Treated to Date 3 4 4 5 5 6 6    Total Fractions on Plan 16 16 16 16 16 16 16    Treatment Elapsed Days - 5 - 6 - 7 -   Reference Point ID Lt Sclav Lt CW Lt Sclav Lt CW Lt Sclav Lt CW Lt Sclav   Dosage Given to Date (Gy) 7.98 10.64 10.64 13.3 13.3 15.96 15.96        Subjective  Overall, she is doing well. Intermittent, self-resolving chest wall pain. Mild fatigue. No esophagitis.    Objective  KARNOFSKY PERFORMANCE SCORE:  80% Normal activity with effort; some symptoms of disease    Fatigue:  Grade 1: Fatigue relieved by rest  Pain:  None  Dermatitis: None   Esophagitis: None     Assessment: Cindy Randolph is a 61 y.o. female undergoing left comprehensive post-mastectomy radiotherapy for left breast cancer. Tolerating treatment with expected, minimal toxicity.    Plan: Continue treatment as planned. Port films reviewed. Skin care instructions provided.     Rolla Flatten, MD  Radiation Oncology

## 2021-06-18 ENCOUNTER — Encounter: Admit: 2021-06-18 | Discharge: 2021-06-18 | Payer: MEDICARE

## 2021-06-18 ENCOUNTER — Ambulatory Visit: Admit: 2021-06-18 | Discharge: 2021-06-18 | Payer: MEDICARE

## 2021-06-18 MED ORDER — DIPHENHYDRAMINE(#)/LIDOCAINE/ANTACID 1:1:1 PO SUSP
10 mL | ORAL | 3 refills | 30.00000 days | Status: AC | PRN
Start: 2021-06-18 — End: ?

## 2021-06-19 ENCOUNTER — Encounter: Admit: 2021-06-19 | Discharge: 2021-06-19 | Payer: MEDICARE

## 2021-06-19 ENCOUNTER — Ambulatory Visit: Admit: 2021-06-19 | Discharge: 2021-06-19 | Payer: MEDICARE

## 2021-06-19 MED FILL — RXAMB DIPH/LIDO/ANTACID 1:1:1 SUSPENSION (COMPOUND): ORAL | 8 days supply | Qty: 100 | Fill #1 | Status: CP

## 2021-06-22 ENCOUNTER — Ambulatory Visit: Admit: 2021-06-22 | Discharge: 2021-06-22 | Payer: MEDICARE

## 2021-06-22 ENCOUNTER — Encounter: Admit: 2021-06-22 | Discharge: 2021-06-22 | Payer: MEDICARE

## 2021-06-23 ENCOUNTER — Encounter: Admit: 2021-06-23 | Discharge: 2021-06-23 | Payer: MEDICARE

## 2021-06-23 ENCOUNTER — Ambulatory Visit: Admit: 2021-06-23 | Discharge: 2021-06-23 | Payer: MEDICARE

## 2021-06-23 NOTE — Progress Notes
Left vm 1/31 @ 10:40--tl

## 2021-06-24 ENCOUNTER — Encounter: Admit: 2021-06-24 | Discharge: 2021-06-24 | Payer: MEDICARE

## 2021-06-24 ENCOUNTER — Ambulatory Visit: Admit: 2021-06-24 | Discharge: 2021-06-24 | Payer: MEDICARE

## 2021-06-24 DIAGNOSIS — R011 Cardiac murmur, unspecified: Secondary | ICD-10-CM

## 2021-06-24 DIAGNOSIS — M5030 Other cervical disc degeneration, unspecified cervical region: Secondary | ICD-10-CM

## 2021-06-24 DIAGNOSIS — M5136 Other intervertebral disc degeneration, lumbar region: Secondary | ICD-10-CM

## 2021-06-24 DIAGNOSIS — M4800 Spinal stenosis, site unspecified: Secondary | ICD-10-CM

## 2021-06-24 DIAGNOSIS — E079 Disorder of thyroid, unspecified: Secondary | ICD-10-CM

## 2021-06-24 DIAGNOSIS — B999 Unspecified infectious disease: Secondary | ICD-10-CM

## 2021-06-24 DIAGNOSIS — F419 Anxiety disorder, unspecified: Secondary | ICD-10-CM

## 2021-06-24 DIAGNOSIS — R519 Generalized headaches: Secondary | ICD-10-CM

## 2021-06-24 DIAGNOSIS — M2550 Pain in unspecified joint: Secondary | ICD-10-CM

## 2021-06-24 DIAGNOSIS — F32A Depression: Secondary | ICD-10-CM

## 2021-06-24 DIAGNOSIS — C50112 Malignant neoplasm of central portion of left female breast: Secondary | ICD-10-CM

## 2021-06-24 DIAGNOSIS — C73 Malignant neoplasm of thyroid gland: Secondary | ICD-10-CM

## 2021-06-24 DIAGNOSIS — IMO0002 Ulcer: Secondary | ICD-10-CM

## 2021-06-24 DIAGNOSIS — S83209A Unspecified tear of unspecified meniscus, current injury, unspecified knee, initial encounter: Secondary | ICD-10-CM

## 2021-06-24 DIAGNOSIS — E039 Hypothyroidism, unspecified: Secondary | ICD-10-CM

## 2021-06-24 DIAGNOSIS — D539 Nutritional anemia, unspecified: Secondary | ICD-10-CM

## 2021-06-24 DIAGNOSIS — C50919 Malignant neoplasm of unspecified site of unspecified female breast: Secondary | ICD-10-CM

## 2021-06-24 DIAGNOSIS — C801 Malignant (primary) neoplasm, unspecified: Secondary | ICD-10-CM

## 2021-06-24 DIAGNOSIS — M5134 Other intervertebral disc degeneration, thoracic region: Secondary | ICD-10-CM

## 2021-06-24 DIAGNOSIS — M549 Dorsalgia, unspecified: Secondary | ICD-10-CM

## 2021-06-24 DIAGNOSIS — I10 Essential (primary) hypertension: Secondary | ICD-10-CM

## 2021-06-24 NOTE — Progress Notes
Weekly Management Progress Note    Date: 06/24/2021    Cindy Randolph is a 61 y.o. female.     Vitals:  Vitals:    06/24/21 1041   BP: 124/82   BP Source: Arm, Right Upper   Pulse: 84   Temp: 37.2 C (98.9 F)   Resp: 16   SpO2: 99%   TempSrc: Oral   PainSc: Six   Weight: 63.7 kg (140 lb 6.4 oz)     Subjective   There were no encounter diagnoses.  Staging: Cancer Staging  Malignant neoplasm of left breast in female, estrogen receptor positive ([place])  Staging form: Breast, AJCC 8th Edition  - Clinical stage from 12/22/2020: Stage IA (cT1c, cN0, cM0, G1, ER+, PR+, HER2-) - Signed by Mathis Dad, MD on 12/22/2020  - Pathologic stage from 02/02/2021: Stage IIIA (pT3, pN3, cM0, G1, ER+, PR+, HER2-) - Signed by Mathis Dad, MD on 05/27/2021      Body mass index is 25.25 kg/m.  Pain Score: Six  Pain Loc: Throat       Treatment Data Summary:      Treatment Data 06/19/2021 06/22/2021 06/22/2021 06/23/2021 06/23/2021 06/24/2021 06/24/2021   Course ID - C1-Left CW - C1-Left CW - C1-Left CW -   Plan ID Lt Sclav-FiF Lt CW-FiF Lt Sclav-FiF Lt CW-FiF Lt Sclav-FiF Lt CW-FiF Lt Sclav-FiF   Prescription Dose (cGy) 4,256 4,256 4,256 4,256 4,256 4,256 4,256   Prescribed Dose per Fraction (Gy) 2.66 2.66 2.66 2.66 2.66 2.66 2.66   Fractions Treated to Date 8 9 9 10 10 11 11    Total Fractions on Plan 16 16 16 16 16 16 16    Treatment Elapsed Days - 12 - 13 - 14 -   Reference Point ID Lt Sclav Lt CW Lt Sclav Lt CW Lt Sclav Lt CW Lt Sclav   Dosage Given to Date (Gy) 21.28 23.94 23.94 26.6 26.6 29.26 29.26        Subjective:  Doing well, no major issues at this time. Discussed sxs management issues and skin care.     Objective: NAD, VSS, Gr 1 erythema, mild hyperpigmentation     Assessment: Lt Chest wall/SCV LN    Plan: 1) cont RT; 2) discussed skin care

## 2021-06-25 ENCOUNTER — Ambulatory Visit: Admit: 2021-06-25 | Discharge: 2021-06-25 | Payer: MEDICARE

## 2021-06-25 ENCOUNTER — Encounter: Admit: 2021-06-25 | Discharge: 2021-06-25 | Payer: MEDICARE

## 2021-06-26 ENCOUNTER — Encounter: Admit: 2021-06-26 | Discharge: 2021-06-26 | Payer: MEDICARE

## 2021-06-26 ENCOUNTER — Ambulatory Visit: Admit: 2021-06-26 | Discharge: 2021-06-26 | Payer: MEDICARE

## 2021-06-26 MED ORDER — TRAMADOL 50 MG PO TAB
50 mg | ORAL_TABLET | Freq: Every evening | ORAL | 0 refills | PRN
Start: 2021-06-26 — End: ?

## 2021-06-29 ENCOUNTER — Encounter: Admit: 2021-06-29 | Discharge: 2021-06-29 | Payer: MEDICARE

## 2021-06-29 ENCOUNTER — Ambulatory Visit: Admit: 2021-06-29 | Discharge: 2021-06-29 | Payer: MEDICARE

## 2021-06-29 DIAGNOSIS — C50112 Malignant neoplasm of central portion of left female breast: Secondary | ICD-10-CM

## 2021-06-29 LAB — CBC
HEMOGLOBIN: 12 g/dL (ref 12.0–15.0)
MCH: 31 pg (ref 26–34)
MCHC: 33 g/dL (ref 32.0–36.0)
MCV: 96 FL (ref 80–100)
MPV: 8.2 FL (ref 7–11)
PLATELET COUNT: 185 K/UL (ref 150–400)
RBC COUNT: 3.8 M/UL — ABNORMAL LOW (ref 4.0–5.0)
RDW: 12 % (ref 11–15)
WBC COUNT: 7.8 K/UL (ref 4.5–11.0)

## 2021-06-29 LAB — COMPREHENSIVE METABOLIC PANEL
ALT: 10 U/L (ref 7–56)
ANION GAP: 8 (ref 3–12)
CO2: 28 MMOL/L (ref 21–30)
EGFR: >60 mL/min (ref >60)
POTASSIUM: 3.9 MMOL/L (ref 3.5–5.1)
SODIUM: 140 MMOL/L (ref 137–147)

## 2021-06-29 LAB — URINALYSIS DIPSTICK
GLUCOSE,UA: NEGATIVE
NITRITE: NEGATIVE
URINE BILE: NEGATIVE
URINE KETONE: NEGATIVE
URINE PH: 6.5
URINE SPEC GRAVITY: 1

## 2021-06-29 NOTE — Telephone Encounter
06/29/21. I spoke with Cindy Randolph and she will have a UA done today and see Dr. Laurence Compton tomorrow before treatment to talk about her symptoms. EAlvey.

## 2021-06-30 ENCOUNTER — Encounter: Admit: 2021-06-30 | Discharge: 2021-06-30 | Payer: MEDICARE

## 2021-06-30 ENCOUNTER — Ambulatory Visit: Admit: 2021-06-30 | Discharge: 2021-06-30 | Payer: MEDICARE

## 2021-06-30 DIAGNOSIS — S83209A Unspecified tear of unspecified meniscus, current injury, unspecified knee, initial encounter: Secondary | ICD-10-CM

## 2021-06-30 DIAGNOSIS — B999 Unspecified infectious disease: Secondary | ICD-10-CM

## 2021-06-30 DIAGNOSIS — R519 Generalized headaches: Secondary | ICD-10-CM

## 2021-06-30 DIAGNOSIS — C50112 Malignant neoplasm of central portion of left female breast: Secondary | ICD-10-CM

## 2021-06-30 DIAGNOSIS — IMO0002 Ulcer: Secondary | ICD-10-CM

## 2021-06-30 DIAGNOSIS — M2550 Pain in unspecified joint: Secondary | ICD-10-CM

## 2021-06-30 DIAGNOSIS — M5134 Other intervertebral disc degeneration, thoracic region: Secondary | ICD-10-CM

## 2021-06-30 DIAGNOSIS — M4800 Spinal stenosis, site unspecified: Secondary | ICD-10-CM

## 2021-06-30 DIAGNOSIS — M5136 Other intervertebral disc degeneration, lumbar region: Secondary | ICD-10-CM

## 2021-06-30 DIAGNOSIS — R011 Cardiac murmur, unspecified: Secondary | ICD-10-CM

## 2021-06-30 DIAGNOSIS — N390 Urinary tract infection, site not specified: Secondary | ICD-10-CM

## 2021-06-30 DIAGNOSIS — C801 Malignant (primary) neoplasm, unspecified: Secondary | ICD-10-CM

## 2021-06-30 DIAGNOSIS — I10 Essential (primary) hypertension: Secondary | ICD-10-CM

## 2021-06-30 DIAGNOSIS — C50919 Malignant neoplasm of unspecified site of unspecified female breast: Secondary | ICD-10-CM

## 2021-06-30 DIAGNOSIS — D539 Nutritional anemia, unspecified: Secondary | ICD-10-CM

## 2021-06-30 DIAGNOSIS — F32A Depression: Secondary | ICD-10-CM

## 2021-06-30 DIAGNOSIS — F419 Anxiety disorder, unspecified: Secondary | ICD-10-CM

## 2021-06-30 DIAGNOSIS — M549 Dorsalgia, unspecified: Secondary | ICD-10-CM

## 2021-06-30 DIAGNOSIS — M5030 Other cervical disc degeneration, unspecified cervical region: Secondary | ICD-10-CM

## 2021-06-30 DIAGNOSIS — E079 Disorder of thyroid, unspecified: Secondary | ICD-10-CM

## 2021-06-30 DIAGNOSIS — C73 Malignant neoplasm of thyroid gland: Secondary | ICD-10-CM

## 2021-06-30 DIAGNOSIS — E039 Hypothyroidism, unspecified: Secondary | ICD-10-CM

## 2021-06-30 MED ORDER — SULFAMETHOXAZOLE-TRIMETHOPRIM 800-160 MG PO TAB
1 | Freq: Two times a day (BID) | ORAL | 0 refills
Start: 2021-06-30 — End: ?

## 2021-06-30 MED ORDER — SULFAMETHOXAZOLE-TRIMETHOPRIM 800-160 MG PO TAB
1 | ORAL_TABLET | Freq: Two times a day (BID) | ORAL | 0 refills | Status: AC
Start: 2021-06-30 — End: ?

## 2021-06-30 NOTE — Telephone Encounter
Left VM with callback # regarding patient's request to transfer care to another provider.

## 2021-06-30 NOTE — Telephone Encounter
Patient called this RN back and left VM. Called patient back and was able to connect with patient. Discussed reasons for transfer of care request to female breast oncologist. Overall patient feels that given her disease and her original request for a female breast oncologist she would like to transfer care. I informed the patient of the process and that I would be sending the nurse manager of [facility] exam an email requesting for a female breast oncologist. Informed patient that myself or the nurse manager of exam would reach out with more information.

## 2021-06-30 NOTE — Progress Notes
Weekly Management Progress Note    Date: 06/30/2021    Cindy Randolph is a 61 y.o. female.     Vitals:  There were no vitals filed for this visit.  Subjective   There were no encounter diagnoses.  Staging: Cancer Staging  Malignant neoplasm of left breast in female, estrogen receptor positive ([facility])  Staging form: Breast, AJCC 8th Edition  - Clinical stage from 12/22/2020: Stage IA (cT1c, cN0, cM0, G1, ER+, PR+, HER2-) - Signed by Massie Kluver, MD on 12/22/2020  - Pathologic stage from 02/02/2021: Stage IIIA (pT3, pN3, cM0, G1, ER+, PR+, HER2-) - Signed by Massie Kluver, MD on 05/27/2021      There is no height or weight on file to calculate BMI.             Treatment Data Summary:      Treatment Data 06/24/2021 06/25/2021 06/25/2021 06/26/2021 06/26/2021 06/29/2021 06/29/2021   Course ID - C1-Left CW - C1-Left CW - C1-Left CW -   Plan ID Lt Sclav-FiF Lt CW-FiF Lt Sclav-FiF Lt CW-FiF Lt Sclav-FiF Lt CW-FiF Lt Sclav-FiF   Prescription Dose (cGy) 4,256 4,256 4,256 4,256 4,256 4,256 4,256   Prescribed Dose per Fraction (Gy) 2.66 2.66 2.66 2.66 2.66 2.66 2.66   Fractions Treated to Date 11 12 12 13 13 14 14    Total Fractions on Plan 16 16 16 16 16 16 16    Treatment Elapsed Days - 15 - 16 - 19 -   Reference Point ID Lt Sclav Lt CW Lt Sclav Lt CW Lt Sclav Lt CW Lt Sclav   Dosage Given to Date (Gy) 29.26 31.92 31.92 34.58 34.58 37.24 37.24        Subjective: Patient is status post 15 out of 16 treatments.  She has odynophagia (not left sided that started at the end of last week) that radiate up to her ears and causing her to have headaches, right flank pain and symptomatic UTI.  She states that Magic mouthwash did nothing for her throat pain, and she has been taking Tylenol instead.  She took ibuprofen once and wonders if she had kidney issues.  Recent UA showed for her to have UTI, and so I will prescribe antibiotics and order a culture.  She states that her kids have all had a cold.       Objective: Images have been checked. Patient gave me verbal consent to proceed with the physical exam, and Ellie was present for the entire duration of the exam.  Mild erythema but no desquamation.      Assessment: Tolerating radiation treatment well.     Plan: Continue radiation without changes. I discussed that radiation-induced odynophagia is typically ipsilateral and begins earlier on in the treatment timeline.  Given what is going on with her family and her kids, this is most likely related to her having a cold.  She has no evident evidence of having kidney damage from taking ibuprofen, I suggested for [place] to treat her UTI and go from there. I will send a prescription for Bactrim to treat her UTI and adjust when the culture returns.              Bosie Helper MD  Radiation Oncology  [address]  [place], [place] [address]  Clinic: [phone]

## 2021-07-01 ENCOUNTER — Encounter: Admit: 2021-07-01 | Discharge: 2021-07-01 | Payer: MEDICARE

## 2021-07-01 ENCOUNTER — Ambulatory Visit: Admit: 2021-07-01 | Discharge: 2021-07-01 | Payer: MEDICARE

## 2021-07-02 ENCOUNTER — Encounter: Admit: 2021-07-02 | Discharge: 2021-07-02 | Payer: MEDICARE

## 2021-07-02 NOTE — Telephone Encounter
Second call with VM left this week. Was calling patient regarding change in upcoming appointment with Dr. Marylou Mccoy. Left callback # on VM.

## 2021-07-02 NOTE — Telephone Encounter
Patient called this RN back and left VM. This RN called patient back and was able to connect with patient. Explained to patient that upcoming appointment with Dr. Marylou Mccoy on 2/24 was cancelled due to the request for transfer of care and the reason for the appointment was to discuss treatment. This is also why the nurse practitioner could not see patient on 2/24. Explained to patient that if transfer of care is happening it is the most beneficial to wait to see new provider vs starting new treatment from existing provider. Informed patient that nurse manager at [facility] was assisting with transfer and would reach out once new provider had been identified. Patient understood. This RN also communicated with Midwife at [employer]. Will continue to follow to ensure transfer of care is established.

## 2021-07-06 ENCOUNTER — Encounter: Admit: 2021-07-06 | Discharge: 2021-07-06 | Payer: MEDICARE

## 2021-07-06 NOTE — Telephone Encounter
07/06/21. I called Cindy Randolph to follow up on her throat. She says it still hurts, but would like to see if it will get better in the next few days. I also apologized, because her urine culture was lost. She stated she did not need to come in to do another one right now, because her symptoms have mostly resolved. Cindy Randolph will reach out if she has any additional issues. EAlvey.

## 2021-07-07 ENCOUNTER — Encounter: Admit: 2021-07-07 | Discharge: 2021-07-07 | Payer: MEDICARE

## 2021-07-07 DIAGNOSIS — S83251D Bucket-handle tear of lateral meniscus, current injury, right knee, subsequent encounter: Secondary | ICD-10-CM

## 2021-07-10 ENCOUNTER — Encounter: Admit: 2021-07-10 | Discharge: 2021-07-10 | Payer: MEDICARE

## 2021-07-10 NOTE — Patient Instructions
Thank you for coming to see us today.   Please call our office or send a message through MyChart if you have any questions or concerns.                                                                                                                 [facility]    [place] [facility]  [address]  [place], [place] [address]      [facility]  [address]  [place], [place] [address]      Dr. Lauren Nye  [phone] (Nurse Camie Mourad Cwikla)  [phone] (Fax)      [email or web address]

## 2021-07-12 ENCOUNTER — Encounter: Admit: 2021-07-12 | Discharge: 2021-07-12 | Payer: MEDICARE

## 2021-07-13 ENCOUNTER — Encounter: Admit: 2021-07-13 | Discharge: 2021-07-13 | Payer: MEDICARE

## 2021-07-13 ENCOUNTER — Ambulatory Visit: Admit: 2021-07-13 | Discharge: 2021-07-13 | Payer: MEDICARE

## 2021-07-13 DIAGNOSIS — B999 Unspecified infectious disease: Secondary | ICD-10-CM

## 2021-07-13 DIAGNOSIS — S83209A Unspecified tear of unspecified meniscus, current injury, unspecified knee, initial encounter: Secondary | ICD-10-CM

## 2021-07-13 DIAGNOSIS — E079 Disorder of thyroid, unspecified: Secondary | ICD-10-CM

## 2021-07-13 DIAGNOSIS — R011 Cardiac murmur, unspecified: Secondary | ICD-10-CM

## 2021-07-13 DIAGNOSIS — F32A Depression: Secondary | ICD-10-CM

## 2021-07-13 DIAGNOSIS — C50919 Malignant neoplasm of unspecified site of unspecified female breast: Secondary | ICD-10-CM

## 2021-07-13 DIAGNOSIS — R519 Generalized headaches: Secondary | ICD-10-CM

## 2021-07-13 DIAGNOSIS — C801 Malignant (primary) neoplasm, unspecified: Secondary | ICD-10-CM

## 2021-07-13 DIAGNOSIS — M4800 Spinal stenosis, site unspecified: Secondary | ICD-10-CM

## 2021-07-13 DIAGNOSIS — M1711 Unilateral primary osteoarthritis, right knee: Secondary | ICD-10-CM

## 2021-07-13 DIAGNOSIS — M5030 Other cervical disc degeneration, unspecified cervical region: Secondary | ICD-10-CM

## 2021-07-13 DIAGNOSIS — M5136 Other intervertebral disc degeneration, lumbar region: Secondary | ICD-10-CM

## 2021-07-13 DIAGNOSIS — C73 Malignant neoplasm of thyroid gland: Secondary | ICD-10-CM

## 2021-07-13 DIAGNOSIS — Z5181 Encounter for therapeutic drug level monitoring: Secondary | ICD-10-CM

## 2021-07-13 DIAGNOSIS — E039 Hypothyroidism, unspecified: Secondary | ICD-10-CM

## 2021-07-13 DIAGNOSIS — S83251D Bucket-handle tear of lateral meniscus, current injury, right knee, subsequent encounter: Secondary | ICD-10-CM

## 2021-07-13 DIAGNOSIS — M5134 Other intervertebral disc degeneration, thoracic region: Secondary | ICD-10-CM

## 2021-07-13 DIAGNOSIS — IMO0002 Ulcer: Secondary | ICD-10-CM

## 2021-07-13 DIAGNOSIS — D539 Nutritional anemia, unspecified: Secondary | ICD-10-CM

## 2021-07-13 DIAGNOSIS — C50112 Malignant neoplasm of central portion of left female breast: Secondary | ICD-10-CM

## 2021-07-13 DIAGNOSIS — F419 Anxiety disorder, unspecified: Secondary | ICD-10-CM

## 2021-07-13 DIAGNOSIS — M549 Dorsalgia, unspecified: Secondary | ICD-10-CM

## 2021-07-13 DIAGNOSIS — M2550 Pain in unspecified joint: Secondary | ICD-10-CM

## 2021-07-13 DIAGNOSIS — I10 Essential (primary) hypertension: Secondary | ICD-10-CM

## 2021-07-13 DIAGNOSIS — M810 Age-related osteoporosis without current pathological fracture: Secondary | ICD-10-CM

## 2021-07-13 MED ORDER — ZOLEDRONIC AC-MANNITOL-0.9NACL 4 MG/100 ML IV PGBK
4 mg | Freq: Once | INTRAVENOUS | 0 refills
Start: 2021-07-13 — End: ?

## 2021-07-13 NOTE — Telephone Encounter
Was contacted by Dr Bea Graff team to take a look a recent photos of radiated left breast wound. Pt reports drainage coming from wound. Denies odor. She also reports fatigue, headache, chills but she feels like symptoms from radiation. She also has a head cold right now. The wound is painful. She has been scheduled with Dr. Keenan Bachelor on Wednesday am at [facility]. Encouraged pt to call back if she develops fever or worsening symptoms. Instructed her to continue silvadene as previously instructed. She also reports struggling leaving the wound open to air because everything is getting soiled. She can loosely place gauze to absorb drainage as needed, but not to seal dressing.

## 2021-07-13 NOTE — Progress Notes
Name: Cindy Randolph          MRN: [id number]      DOB: 1960-10-23      AGE: 61 y.o.   DATE OF SERVICE: 07/13/2021    Subjective:             Reason for Visit:  New CA Pt      Cindy Randolph is a 61 y.o. female.      Cancer Staging   Malignant neoplasm of left breast in female, estrogen receptor positive (HCC)  Staging form: Breast, AJCC 8th Edition  - Clinical stage from 12/22/2020: Stage IA (cT1c, cN0, cM0, G1, ER+, PR+, HER2-) - Signed by Massie Kluver, MD on 12/22/2020  - Pathologic stage from 02/02/2021: Stage IIIA (pT3, pN3, cM0, G1, ER+, PR+, HER2-) - Signed by Massie Kluver, MD on 05/27/2021      History of Present Illness  [name] is a 61 y.o. woman with a recent diagnosis of left hormone positive breast cancer.    She presented for a routine mammogram in July 2022 which showed benign-appearing microcalcifications and an unchanged low-density circumscribed mass in the right upper upper central breast.  There was also a focal asymmetry in the left breast.  She had a diagnostic left mammogram on 12/04/2020 which showed an irregular density at 12:00 in the left breast and on target ultrasound there was an ill-defined area with slightly hypoechoic at 12:00 7 cm from the nipple.  She underwent ultrasound-guided biopsy on 12/10/2020 which showed grade 1 invasive lobular carcinoma ER 91 to 100%, PR 11 to 20%, HER2 1+ (negative by IHC), Ki-67 2 to 5%.  She then had an MRI on 12/26/2020 which showed multiple faint masses in the left breast with non-mass enhancement extending in the upper and inner quadrant.     On 01/22/2021 she underwent bilateral mastectomy with final pathology showing left breast invasive lobular carcinoma, grade 1, multifocal with connections that merged together for a total extent of 7.4 cm, 5 out of 5 lymph nodes were positive for carcinoma.  Markers were ER 96%, PR 10%, HER2 0, Ki-67 3%.  Right breast was benign.    She had staging scans 02/10/2021 which were negative for metastatic disease.  She then received adjuvant chemotherapy with dose dense AC from 02/23/2021 until 04/06/2021.  She was scheduled to start paclitaxel on 04/20/2021 but declined due to concern for neuropathy.     She underwent left axillary lymph node dissection on 05/14/2021 with final pathology showing carcinoma in 6 of 14 lymph nodes with the largest metastatic focus measuring 1 cm.  This is for a total of 11 out of 19 lymph nodes positive for carcinoma.    She completed adjuvant PMRT on 07/01/2021. She is healing well, as expected. Having some slothing under axilla. She has an area on her chest that she thinks is looking worse. She had an area of cellulitis in this area post-operatively. It was having a yellow-green exudate in the last 24-48hrs.    She has a history of thyroid cancer s/p thyroidectomy but records are no longer available. She also was recently diagnosed with osteoporosis. She is currently taking calcium (600mg ) and vitamin D (5000iu). She is having some hot flashes but they have declined since chemotherapy, 1-2 during day and 1-2 at night.               Medical History:   Diagnosis Date   ? Acquired hypothyroidism    ?  Anxiety 2016    from pain   ? Back pain    ? Breast cancer in female [facility]) 12/2020    Left   ? Cancer of thyroid (HCC) 1980   ? Degenerative disc disease, cervical 1994   ? Degenerative disc disease, lumbar 2010   ? Degenerative disc disease, thoracic 2010   ? Depression situational, transient    after divorce, after death of 2nd husband   ? Essential hypertension began in 3rd trimester most pregnancies    remained after last pregnancy   ? Essential hypertension, benign as above   ? Generalized headaches 1996-2010    probably hormonally related   ? Heart murmur at birth    Dr. Maisie Fus said he didn't hear it a few years ago   ? Infection    ? Joint pain 1994   ? Other malignant neoplasm without specification of site thyroid, 1980    thyroidectomy   ? Spinal stenosis 2017 to present    per MRIs   ? Thyroid disorder as noted   ? Torn meniscus 12/25/2020   ? Ulcer 1987    with divorce; resolved   ? Unspecified deficiency anemia 2009    from excessive bleeding post-partum; treated iron       Surgical History:   Procedure Laterality Date   ? HX WRIST FRACTURE SURGERY  1993    Baker's thumb with fixation   ? ARTHROPLASTY  2001    L ACL   ? COLONOSCOPY  2017    normal   ? ARTHROSCOPY KNEE WITH PARTIAL LATERAL MENISCECTOMY AND LEFT KNEE INJECTION. Right 01/08/2021    Performed by Vopat, Lowry Ram, MD at [facility] OR   ? BILATERAL TOTAL MASTECTOMIES Bilateral 01/22/2021    Performed by Massie Kluver, MD at [facility]   ? INTRAOPERATIVE SENTINEL LYMPH NODE IDENTIFICATION WITH/ WITHOUT NON-RADIOACTIVE DYE INJECTION Left 01/22/2021    Performed by Massie Kluver, MD at [facility]   ? INJECTION RADIOACTIVE TRACER FOR SENTINEL NODE IDENTIFICATION Left 01/22/2021    Performed by Massie Kluver, MD at [facility]   ? LEFT AXILLARY SENTINEL LYMPH NODE BIOPSY Left 01/22/2021    Performed by Massie Kluver, MD at [facility]   ? BILATERAL CHEST FLAT CLOSURE Bilateral 01/22/2021    Performed by Erlene Quan, Zachary George, MD at [facility]   ? BILATERAL CHEST FLAT CLOSURE x 8 Bilateral 01/22/2021    Performed by Stevenson Clinch, MD at [facility]   ? EXCISION BENIGN LESION 0.5 CM OR LESS - TORSO Right 01/22/2021    Performed by Erlene Quan, Zachary George, MD at [facility]   ? TUNNELED VENOUS PORT PLACEMENT Right 02/19/2021   ? Placement of port-a-cath - 31F Right 02/19/2021    Performed by Freund, Alecia Lemming., MD at [facility]   ? FLUOROSCOPIC GUIDANCE CENTRAL VENOUS ACCESS DEVICE PLACEMENT/ REPLACEMENT/ REMOVAL N/A 02/19/2021    Performed by Flo Shanks, Alecia Lemming., MD at [facility] OR   ? Left Completion Axillary Lymph Node Dissection Left 05/14/2021    Performed by Massie Kluver, MD at [facility]   ? REMOVAL TUNNELED CENTRAL VENOUS ACCESS DEVICE INCLUDING PORT/ PUMP Right 05/14/2021    Performed by Massie Kluver, MD at [facility]   ? KNEE SURGERY  L ACL as above   ? THYROIDECTOMY           Family History   Problem Relation Age of Onset   ?  Arthritis Paternal Grandmother         (osteoarthritis) multiple heberdens noduls and deformities   ? Back pain Paternal Grandmother    ? Arthritis Mother         wear and tear   ? Back pain Mother    ? Hypertension Mother    ? Joint Pain Mother    ? Neck Pain Mother    ? Cancer-Breast Mother 77        was on HRT for 10-15 years   ? Basal Cell Carcinoma Brother    ? Back pain Brother    ? Hypertension Brother    ? Basal Cell Carcinoma Brother    ? Back pain Brother         has had surgery   ? Hypertension Brother    ? Basal Cell Carcinoma Brother    ? Back pain Brother    ? Hypertension Brother    ? Diabetes Father         Type 2   ? Heart problem Father         CABG 3   ? Heart Disease Father    ? Hypertension Father    ? Diabetes Maternal Grandfather         Type 2   ? Birth Defect Daughter         PRS, congenital diaphragmatic hernia, malrotation, grey matter heterotopia   ? Stroke Maternal Uncle    ? Thyroid Disease Maternal Uncle    ? Diabetes Paternal Grandfather    ? Birth Defect Nephew         craniosynostosis   ? Cancer-Breast Maternal Great-Aunt 73       Social History     Socioeconomic History   ? Marital status: Married   Occupational History   ? Occupation: retired Engineer, civil (consulting)   Tobacco Use   ? Smoking status: Never   ? Smokeless tobacco: Never   Vaping Use   ? Vaping Use: Never used   Substance and Sexual Activity   ? Alcohol use: Never     Comment: one per month beer   ? Drug use: Never     Comment: in late teens, early twenties, a few times and not since   ? Sexual activity: Yes     Partners: Male     Birth control/protection: Post-menopausal, None            Review of Systems   Constitutional: Positive for activity change and fatigue. Negative for chills, diaphoresis, fever and unexpected weight change.   HENT: Positive for congestion, ear pain, postnasal drip and rhinorrhea. Negative for mouth sores, nosebleeds and sinus pressure.    Eyes: Negative for pain and visual disturbance.   Respiratory: Negative for cough, shortness of breath and wheezing.    Cardiovascular: Negative for chest pain and palpitations.   Gastrointestinal: Negative for abdominal distention, abdominal pain, constipation, diarrhea, nausea and vomiting.   Genitourinary: Negative for menstrual problem and vaginal bleeding.   Musculoskeletal: Negative for arthralgias, back pain and myalgias.   Skin: Negative for rash and wound.   Neurological: Negative for dizziness, weakness, light-headedness, numbness and headaches.   Hematological: Negative for adenopathy. Does not bruise/bleed easily.   Psychiatric/Behavioral: Negative for sleep disturbance. The patient is not nervous/anxious.          Objective:         ? acetaminophen (TYLENOL) 325 mg tablet Take two tablets by mouth every 6 hours. Take scheduled for 3  days after surgery, then as needed. Do not exceed 4,000mg  in a 24 hour period.   ? cholecalciferol (vitamin D3) (VITAMIN D3 PO) Take  by mouth.   ? DIPH/LIDO/ANTACID 1:1:1 ORAL SUSPENSION (COMPOUND) Swish and Swallow 10 mL by mouth as directed as Needed for Mouth Pain. As needed 3-4 times a day   ? letrozole [facility]) 2.5 mg tablet Take one tablet by mouth daily. (Patient taking differently: Take one tablet by mouth daily. will start after surgery)   ? levothyroxine (SYNTHROID) 112 mcg tablet Take one tablet by mouth daily 30 minutes before breakfast.   ? lisinopriL (ZESTRIL) 2.5 mg tablet Take one tablet by mouth twice daily.   ? mupirocin (CENTANY) 2 % topical ointment Apply  topically to affected area twice daily.   ? nifedipine 0.3%/lidocaine 1.5%/white petrolatum(#) Apply a pea sized amount to the anal opening two to four times a day. Do NOT insert into anus.   ? silver sulfADIAZINE (SSD) 1 % topical cream Apply to affected area up to 3-4 times a day as needed.   ? traMADoL (ULTRAM) 50 mg tablet Take one tablet by mouth at bedtime as needed for Pain.   ? vitamins, B complex tab Take one tablet by mouth three times weekly.   ? vitamins, multiple cap Take one capsule by mouth daily.     Vitals:    07/13/21 0854   BP: 122/74   BP Source: Arm, Right Upper   Pulse: 79   Temp: 36.9 ?C (98.4 ?F)   Resp: 18   SpO2: 99%   TempSrc: Temporal   PainSc: Three   Weight: 63.9 kg (140 lb 12.8 oz)     Body mass index is 25.33 kg/m?Marland Kitchen     Pain Score: Three  Pain Loc: Knee    Fatigue Scale: 6    Pain Addressed:  Current regimen working to control pain. Torn meniscus s/p repair    Patient Evaluated for a Clinical Trial: No treatment clinical trial available for this patient.     [place] Cooperative Oncology Group performance status is 0, Fully active, able to carry on all pre-disease performance without restriction.        Physical Exam  Constitutional:       Appearance: She is well-developed.   HENT:      Head: Normocephalic and atraumatic.   Eyes:      General: No scleral icterus.     Conjunctiva/sclera: Conjunctivae normal.   Cardiovascular:      Rate and Rhythm: Normal rate.   Pulmonary:      Effort: Pulmonary effort is normal. No respiratory distress.      Breath sounds: Normal breath sounds. No wheezing.   Chest:   Breasts:     Right: No inverted nipple, mass, nipple discharge, skin change or tenderness.      Left: Skin change present. No inverted nipple, mass, nipple discharge or tenderness.       Abdominal:      General: There is no distension.      Palpations: Abdomen is soft.      Tenderness: There is no abdominal tenderness.   Musculoskeletal:         General: No tenderness.      Cervical back: Normal range of motion and neck supple.   Lymphadenopathy:      Upper Body:      Right upper body: No supraclavicular or axillary adenopathy.      Left upper body: No supraclavicular or axillary adenopathy.  Skin:     General: Skin is warm and dry.      Findings: No rash.   Neurological:      Mental Status: She is alert and oriented to person, place, and time. Psychiatric:         Behavior: Behavior normal.         Thought Content: Thought content normal.            Imaging and pathology reviewed and pertinent findings summarized in HPI.          Assessment and Plan:  Neliah Cuyler is a 62 y.o. postmenopausal woman with a history of Stage IIIA (pT3pN3cN0) hormone positive invasive lobular carcinoma.     Reviewed history, imaging and pathology as detailed above.     Discussed recommendation for adjuvant endocrine therapy for at least 5 years with an aromatase inhibitor, however consider extended therapy given extent of disease. We reviewed the aromatase inhibitor letrozole which is taken daily. Discussed possible side effects including but not limited to post-menopausal changes such as hot flashes and vaginal dryness as well as arthralgias. In addition there is a risk of osteoporosis and very low risk of thromboembolism (< 0.5%). Letrozole was prescribed already.     Hot flashes - reviewed available medications to control hot flashes including gabapentin, oxybutynin and venlafaxine. She is not feeling like she needs these at this time, but may consider in the future.    After initiation of aromatase inhibitor, if tolerating well after first 6-8 weeks, I would recommend adjuvant abemaciclib. Abemaciclib is recommended in combination with endocrine therapy for the adjuvant treatment of adult patients with hormone receptor-positive, HER2-negative, node-positive, early breast cancer at high risk of recurrence (Any N2 or N3 disease or N1 with either T3 or grade 3 or Ki-67 of 20%) (MONARCHE study, Annals of Oncology 2021).  Abemaciclib is given for 2 years along with AI. Plan to start at 100mg  BID dose and if well tolerated increase to standard 150mg  BID dosing.     Will need annual bone density while on an aromatase inhibitor. Also recommend getting 2-3 servings of calcium in diet and taking 2000IU vitamin D daily. Bone density on 04/22/21 with osteoporosis. Recommend adjuvant IV bisphosphonate (zolendonric acid) while on AI in setting of osteoporosis.     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    RTC in 2 months for EOT visit with labs and zolendronic acid.              Joni Reining, MD  Breast Medical Oncology  Associate Professor of Internal Medicine, Division of Medical Oncology  [facility]

## 2021-07-13 NOTE — Patient Instructions
Please do not hesitate to contact my office with any questions.    Dr. Bryan Vopat & Micaylah Bertucci Caldwell PA-C - Orthopedic Surgeon, Sports Medicine  [facility] - Phone [phone] - Fax [phone]   [address], [address] - [place], [place] [address]    To schedule an appointment, please call our scheduling line at [phone]    Kara Schuessler BSN, RN - Clinical Nurse Coordinator  Casey Conover BSN, RN - Clinical Nurse Coordinator  Gregg Holster ATC, LAT - Clinical Athletic Trainer    Thank you for supporting our practice! Your feedback helps us deliver the highest quality of care.  Review us at: [email or web address]

## 2021-07-14 NOTE — Progress Notes
..  This prechart is intended to be a reference for patient appointments. Information is gathered from in chart as well as external records review. Information will be clarified/verified/updated in final documentation in the office visit.     Pre Clinic Pre Chart:    Cindy Randolph is a 61yo female with a history of breast cancer on chemotherapy who follows up ~6wks after starting nifedipine/lidocaine for anal fissure. She felt her symptoms significantly improved after 2-3wks of topical nifedipine/liodcaine so she stopped. At her 6wk follow up the fissure was still present. She was asked to resume topical nifedipine/lidocaine and continue for 6wks despite symptom improvement.     Problem List:  Fissure  Breast Cancer    03/16/2021 - Med Onc:  I,ve used the Prep H, baths and witch Hazel wipes, but the external hemorrhoid continues, and is frustrating. I?ve dealt with a much smaller, infrequent problem since my last couple of pregnancies but the increased irregularity since Chemo, and more irritated state  of the intestine means I?m chasing constipation vs very soft stool. I?m normally much more regular. It is not strangulated--that did happen once a decade ago and Prep H and sitz baths fixed it. But this is just stubborn. Is there a prescription ointment to switch to that Dr. Neil Crouch could prescribe? I?m past the recommended usage time of PrepH.  To the good- I?ve only needed Zofran or Compazine one a day, if that. Nausea is certainly better these last few days! Re: diet, I eat fruits and vegetables and some whole grains and legumes (little to no white flour/sugar) so I do get natural soluble and insoluble fiber.  Metamucil and or Miralax which used as directed when indicated have proved overrated in results (and make one more crampy).  This really is tiring and I could use one less very annoying consequence of Chemo.  I'm seeing Dr. Essie Christine today at [facility] for a scheduled F/U for my right knee. It still has swelling, & is sore at times. Dealing with so much. But again, in general, it?s much better than the first cycle.   I did shave my head when the hair came off in handfuls this week and I?m dealing well with that aspect of Chemo--at least! Getting walks and stationary bike sessions in!    2017 - Colonoscopy:

## 2021-07-15 ENCOUNTER — Encounter: Admit: 2021-07-15 | Discharge: 2021-07-15 | Payer: MEDICARE

## 2021-07-15 ENCOUNTER — Ambulatory Visit: Admit: 2021-07-15 | Discharge: 2021-07-16 | Payer: MEDICARE

## 2021-07-15 ENCOUNTER — Ambulatory Visit: Admit: 2021-07-15 | Discharge: 2021-07-15 | Payer: MEDICARE

## 2021-07-15 DIAGNOSIS — S83209A Unspecified tear of unspecified meniscus, current injury, unspecified knee, initial encounter: Secondary | ICD-10-CM

## 2021-07-15 DIAGNOSIS — F419 Anxiety disorder, unspecified: Secondary | ICD-10-CM

## 2021-07-15 DIAGNOSIS — M5134 Other intervertebral disc degeneration, thoracic region: Secondary | ICD-10-CM

## 2021-07-15 DIAGNOSIS — Z923 Personal history of irradiation: Secondary | ICD-10-CM

## 2021-07-15 DIAGNOSIS — K602 Anal fissure, unspecified: Secondary | ICD-10-CM

## 2021-07-15 DIAGNOSIS — C50112 Malignant neoplasm of central portion of left female breast: Secondary | ICD-10-CM

## 2021-07-15 DIAGNOSIS — C801 Malignant (primary) neoplasm, unspecified: Secondary | ICD-10-CM

## 2021-07-15 DIAGNOSIS — E079 Disorder of thyroid, unspecified: Secondary | ICD-10-CM

## 2021-07-15 DIAGNOSIS — F32A Depression: Secondary | ICD-10-CM

## 2021-07-15 DIAGNOSIS — D539 Nutritional anemia, unspecified: Secondary | ICD-10-CM

## 2021-07-15 DIAGNOSIS — R519 Generalized headaches: Secondary | ICD-10-CM

## 2021-07-15 DIAGNOSIS — M4800 Spinal stenosis, site unspecified: Secondary | ICD-10-CM

## 2021-07-15 DIAGNOSIS — S21102A Unspecified open wound of left front wall of thorax without penetration into thoracic cavity, initial encounter: Secondary | ICD-10-CM

## 2021-07-15 DIAGNOSIS — IMO0002 Ulcer: Secondary | ICD-10-CM

## 2021-07-15 DIAGNOSIS — R011 Cardiac murmur, unspecified: Secondary | ICD-10-CM

## 2021-07-15 DIAGNOSIS — M549 Dorsalgia, unspecified: Secondary | ICD-10-CM

## 2021-07-15 DIAGNOSIS — C73 Malignant neoplasm of thyroid gland: Secondary | ICD-10-CM

## 2021-07-15 DIAGNOSIS — B999 Unspecified infectious disease: Secondary | ICD-10-CM

## 2021-07-15 DIAGNOSIS — M5030 Other cervical disc degeneration, unspecified cervical region: Secondary | ICD-10-CM

## 2021-07-15 DIAGNOSIS — E039 Hypothyroidism, unspecified: Secondary | ICD-10-CM

## 2021-07-15 DIAGNOSIS — M5136 Other intervertebral disc degeneration, lumbar region: Secondary | ICD-10-CM

## 2021-07-15 DIAGNOSIS — L987 Excessive and redundant skin and subcutaneous tissue: Secondary | ICD-10-CM

## 2021-07-15 DIAGNOSIS — C50919 Malignant neoplasm of unspecified site of unspecified female breast: Secondary | ICD-10-CM

## 2021-07-15 DIAGNOSIS — I10 Essential (primary) hypertension: Secondary | ICD-10-CM

## 2021-07-15 DIAGNOSIS — K601 Chronic anal fissure: Secondary | ICD-10-CM

## 2021-07-15 DIAGNOSIS — M2550 Pain in unspecified joint: Secondary | ICD-10-CM

## 2021-07-15 MED ORDER — SODIUM CHLORIDE 0.9 % IV SOLP
250 mL | INTRAVENOUS | 0 refills
Start: 2021-07-15 — End: ?

## 2021-07-15 NOTE — Patient Instructions
[facility]  Surgery Instructions    Surgery Date: August 14, 2021  (Please be aware that this appointment for your surgery will not be visible in the MyChart portal.)    Location:  Your surgery will be held at [facility] ([address] [place], [place] [address].)  Where to park on the day of surgery: [address]age (located just [place] [address] on [address])  Where to check in on the day of surgery: In the Admitting office (located on Level 1 of hospital of the [employer] [facility])      Arrival Time:   The OR scheduling office must finalize the OR schedule before we can provide a time for your surgery. Please do not call your physicians office about surgery arrival time. Your surgery start time and arrive times will be called out to you the day before your procedure between 2pm-4:30pm. If you have not heard from the hospital regarding your surgery time by 4:30PM the evening before your surgery, please call [phone].  The day of the procedure, if you cannot keep your appointment, or are delayed, please contact the surgery center immediately at [phone].      COVID-19 Testing:   No testing needed for planned surgery. If you develop any COVID-19 symptoms within 14 days of your procedure, please call [phone]. Ask to be connected to Einar Pheasant (Nurse for Dr. Daphine Deutscher)..     Symptoms of COVID-19 may include any of the following:  Fever or chills, Cough, Shortness of breath or difficulty breathing, Fatigue, Muscle or body aches, Headache, New loss of taste or smell, Sore throat, Congestion or runny nose, Nausea or vomiting, Diarrhea    Eating and drinking:   Do not eat after 11PM the night before your surgery unless otherwise instructed. No gum, No candy or mints! Sips or small amounts of water and clear liquids may be consumed after 11pm, but must be stopped 2 hours before you need to arrive to the hospital the day of your surgery.     Bowel Prep:   No Bowel Prep Required     Hygiene:    Shower the night before and the morning of your procedure , you will not need to perform your showers with any special soaps, just be sure you clean on the day of your surgery.    Personal Items:    Do not wear makeup, fingernail polish, lotion, deodorant, or perfume  Remove all metal jewelry and piercings.   Bring a Retail buyer for your glasses, contacts, hearing aids, dentures, etc.     Thing to Bring:    Personal identification (ID)   Insurance card    Medications:    You will be given medication instructions by the Pre-Operative Assessment Clinic [facility]). If you have NOT been scheduled a Pre-Operative Assessment appointment, you will receive a phone call to be ?triaged? by the anesthesia team. Please be sure to speak with the anesthesia team or attend your [facility] appointment if setup for you. If you do not, this may result in cancellation or delay of your surgery.  If you did not receive medication instructions, or have questions about any of your current medications, as it relates to the day of your surgery, please call [phone].    Transportation/Visitor Policy:    During the COVID-19 crisis, for the safety of our patients, the health system has limited visitors.  Please be aware of the  following:     You are having an outpatient surgery, a responsible adult you know and trust is required to drive you home and stay with you for 12-24 hours after surgery. If you do not have this arranged prior to surgery, your surgery may be delayed or cancelled. Remember, you are not allowed to drive for 24 hours after surgery.  A maximum of 2 total visitors are allowed during your hospital admission each day.   During the time of your surgery, due to the size of the perioperative waiting room, only 1 visitor is allowed in the designated waiting room.  If you have 2 visitors during the time of your surgery, please know your visitors will be asked to find a place to wait in the hospital lobby or cafeteria and will be notified by the nurse liaison team, when you are out of surgery.  No visitors are allowed in pre/post patient care areas until a patient is transferred to a patient room.  If you would like more than 1 visitor to be at the hospital during surgery, please be aware you are only allowed   No visitors are allowed for patients with active COVID-19 infections.    All visitors must:     Wear a mask at all times until further notice.  Maintain physical distancing of at least six feet from others.  Wash hands or use hand sanitizer when entering or exiting patient rooms.   Any visitors who have a fever or other cold- or flu-like symptoms will not be allowed in [facility] [place] [facility] facilities.     Thank you for understanding our visitation policies. We will continue to evaluate our recommendations and keep you updated on revisions based on public health guidance.       FMLA:   If you have any FMLA or Short Term Disability Paperwork needs, you may have these faxed to [phone]- Attention to Einar Pheasant, RN. Be sure your name and birthday are on the forms, along with a return fax number for this office to return them to once completed. Please allow up to 7-10 business days for completion of paperwork.     Billing Questions:   A member of the business office will verify your medical benefits with your insurance company prior to your surgery. If you have questions about understanding the cost of your care please call the Financial Customer Service at [phone].         If you have questions, please call [phone]. Ask to be connected to Einar Pheasant (Nurse for Dr. Daphine Deutscher).    If your needs are URGENT and it?s after hours   please call [phone] and have the on call provider with Surgical Oncology paged.

## 2021-07-15 NOTE — Pre-Anesthesia Medication Instructions
YOUR MEDICATION LIST     acetaminophen (TYLENOL) 325 mg tablet Take two tablets by mouth every 6 hours. Take scheduled for 3 days after surgery, then as needed. Do not exceed 4,000mg  in a 24 hour period.    ascorbic acid (VITAMIN C PO) Take  by mouth.    CALCIUM PO Take  by mouth.    cholecalciferol (vitamin D3) (VITAMIN D3 PO) Take  by mouth.    DIPH/LIDO/ANTACID 1:1:1 ORAL SUSPENSION (COMPOUND) Swish and Swallow 10 mL by mouth as directed as Needed for Mouth Pain. As needed 3-4 times a day (Patient not taking: Reported on 07/15/2021)    letrozole [facility]) 2.5 mg tablet Take one tablet by mouth daily. (Patient taking differently: Take one tablet by mouth daily. will start after surgery)    levothyroxine (SYNTHROID) 112 mcg tablet Take one tablet by mouth daily 30 minutes before breakfast.    lisinopriL (ZESTRIL) 2.5 mg tablet Take one tablet by mouth twice daily.    mupirocin (CENTANY) 2 % topical ointment Apply  topically to affected area twice daily.    nifedipine 0.3%/lidocaine 1.5%/white petrolatum(#) Apply a pea sized amount to the anal opening two to four times a day. Do NOT insert into anus.    silver sulfADIAZINE (SSD) 1 % topical cream Apply to affected area up to 3-4 times a day as needed.    traMADoL (ULTRAM) 50 mg tablet Take one tablet by mouth at bedtime as needed for Pain.    vitamins, B complex tab Take one tablet by mouth three times weekly.    vitamins, multiple cap Take one capsule by mouth daily.         YOUR MEDICATION INSTRUCTIONS FOR SURGERY    Please continue taking your medicine as your doctor has told you to UNLESS it is listed to stop below.      14 DAYS BEFORE SURGERY  Do not take the following vitamins, herbals, and supplements:  Vitamin B Complex  Multivitamin  Vitamin C  Do not start any new vitamins, herbals, or natural supplements before surgery.      7 DAYS BEFORE SURGERY  DO NOT TAKE the following anti-inflammatory medications such as ibuprofen (Advil, Motrin) and naproxen (Aleve)  You may use acetaminophen (Tylenol)      DAY BEFORE SURGERY  TAKE your medications the day before surgery as usual unless told differently      MORNING OF SURGERY  DO NOT take these medications:  Remaining vitamins/supplements  Ointments/creams/lotions  Letrozole  Lisinopril      TAKE these medications with a sip (1-2 ounces) of water:  Levothyroxine  Use all inhalers, nasal sprays, and eye drops as usual  May take if needed:  Tramadol      Please contact Debbie, RN, with any changes to your medication list or medication questions before surgery.  E-mail:  [email or web address]  Phone: [phone]    Before going home from the hospital, please ask your doctor when you should re-start any medicine that was stopped for surgery.

## 2021-07-15 NOTE — Pre-Anesthesia Patient Instructions
GENERAL INFORMATION    Before you come to the hospital  If you are having an outpatient procedure, you will need to arrange for a responsible ride/person to accompany you home due to sedation or anesthesia with your procedure. A responsible person is a person who has the ability to identify a change in the patient's status and notify medical personnel.  This is typically a family member or friend.  Public transportation is permitted if you have a responsible person to accompany you.  An Benedetto Goad, taxi or other public transportation driver is not considered a responsible person to accompany you home.  Bath/Shower Instructions  Take a bath or shower with antibacterial soap the night before or the morning of your procedure. Use clean towels.  Put on clean clothes after bath or shower.  Avoid using lotion and oils.  If you are having surgery above the waist, wear a shirt that fastens up the front.  Sleep on clean sheets if bath or shower is done the night before procedure.  Leave money, credit cards, jewelry, and any other valuables at home. The [facility] is not responsible for the loss or breakage of personal items.  Remove nail polish, makeup and all jewelry (including piercings) before coming to the hospital.  The morning of your procedure:  brush your teeth and tongue  do not smoke, vape, chew or use any tobacco products  do not shave the area where you will have surgery    What to bring to the hospital  ID/ Insurance Card  Medical Device card  Official documents for legal guardianship   Copy of your Living Will, Advanced Directives, and/or Durable Power of Attorney.  If you have these documents, please bring them to the admissions office on the day of your surgery to be scanned into your records.  Small bag with a few personal belongings  Cases for glasses/hearing aids/contact lens (bring solutions for contacts)  Dress in clean, loose, comfortable clothing     Preparing to get your medications at discharge  Your surgeon may prescribe you medications to take after your procedure.  If you would like the convenience of having your medications filled here at [place] please do one of the following:  Go to [facility] after your [facility] appointment to put a credit card on file.  Call [facility] pharmacy at [phone] (Monday-Friday 7am-9pm or Saturday and Sunday 9am-5pm) to put a credit card on file.  Bring a credit card or cash on the day of your procedure- please leave with a family member rather than bringing it into the preop area.     Eating or drinking before surgery  Do not eat or drink anything after 11:00 p.m. the day before your procedure (including gum, mints, candy, or chewing tobacco) OR follow the specific instructions you were given by your Surgeon.  You may have WATER ONLY up to 2 hours before arriving at the hospital.     Other instructions  Notify your surgeon if:  there is a possibility that you are pregnant   you become ill with a cough, fever, sore throat, nausea, vomiting or flu-like symptoms  you have any open wounds/sores that are red, painful, draining, or are new since you last saw the doctor  you need to cancel your procedure    On the day of your procedure, notify us at [place]: [phone]  if you need to cancel your procedure  if you are going to be late  Arrival at the hospital  [facility]  [address]  [place], [place] [address]    Park in the P5 parking garage located at [address], [place], [place] [address].   If parking in the P5 garage, take the east elevators in the parking garage to the second level and walk to the entrance of the [employer].    Enter through the 1st floor main entrance and check in with Information Desk.   If you are a woman between the ages of 40 and 18, and have not had a hysterectomy, you will be asked for a urine sample prior to surgery.  Please do not urinate before arriving in the Surgery Waiting Room.  Once there, check in and let the attendant know if you need to provide a sample.  Phone carriers that use spam blockers will sometimes block our phone numbers. If your phone contact number is a mobile phone, please adjust your settings to make sure you receive our call.  In your phone settings, turn OFF the setting ?silence unknown callers.?  Please add these phone numbers to your contacts [phone] & [phone]     You will receive a call with your surgery arrival time between 2:30pm and 4:30pm the last business day before your procedure.  If you do not receive a call, please call [phone] before 4:30pm or [phone] after 4:30pm.          For the safety of all patients, visitors and staff as we work to contain COVID-19, we must restrict patient visitors.    Current Visitor Policy (12/22/20):    Our current, and ongoing, visitor rules in surgery and procedural areas are:    2 visitors per patient will be allowed to accompany the patient and wait in the Waiting Room.     Patients in inpatient and pediatric units, Emergency Department, ambulatory clinics and lab appointments may only have two visitors.       For inpatient stays, patients may have 2 visitors at a time at their bedside. The two visitors can change throughout the day, but no more than two at a time may be bedside.  The policy applies to [facility] [place] [facility]s [place], [address], [employer] and [place] campuses and clinics.    Exceptions include:  No visitors allowed for patients with active COVID-19 infections.  Children younger than age 73 are allowed to visit inpatients.  Two parents/guardians are allowed for surgical or procedural patients younger than 60 years old.  Adult inpatients in semiprivate rooms may have visitors, but visits should be coordinated so only two total visitors are in a room at a time due to space limitations.    Visitors must be free of fever and symptoms to be in our facilities. We ask visitors to follow these guidelines:  Wear a mask at all times, unless under the age of 2, have trouble breathing or are unconscious, incapacitated or otherwise unable to remove the cover without assistance.  Go directly to the nursing station in the unit you are visiting and do not linger in public areas.  Check in at the nursing station before going to the patient's room.  Maintain a physical distance of six feet from all others.  Follow elevator restrictions to four riding at a time - peak times are 6:30-7:30 a.m., noon and 6:30-7:30 p.m.  Be aware cafeteria peak times are 11 a.m. - 1 p.m.  Wash your hands frequently  and cover your coughs and sneezes.    [name], it was a pleasure speaking with you today.  These are the instructions we discussed.  If you have any changes in your health or questions about your instructions, please reach out to me via email or phone call.     Debbie, RN   [phone]  [email or web address]

## 2021-07-15 NOTE — Progress Notes
[facility] phone triage completed with patient for surgery on 08/14/21 with Dr. Hassell Done.  Medications, allergies and medical history reviewed and updated in chart.  Patient states she started developing cold-like symptoms 3 weeks ago and described as sinus pressure, post-nasal drip aggravating esophagitis and right ear pain and states symptoms are improving.  She denies positive COVID-19 test.  She states she also has a left chest wound from her lymph node dissection on 05/14/21 that developed cellulitis and was since treated with antibiotics; however it is now classified as delayed wound healing and is seeing a wound care specialist Monday, July 20, 2021.  She states she will be contacting Dr. Earlie Server office after that procedure to discuss this current procedure further.  She denies any other changes in her health status since she was last seen in the OR 05/14/21 at this time.  She states she can climb 2 flights of stairs without CP/DOE.  Denies URI symptoms at this time. No PAC visit indicated.    Preop and medication instructions reviewed with patient. No vitamins or supplements for 14 days and no NSAIDS for 7 days before surgery.  NPO after 11 pm the night before surgery but ok to drink water until 2 hours before arrival at hospital. Patient verbalized understanding and copy of instructions were placed in MyChart.    Patient was informed of mask and visitor policy and expressed understanding.  They were informed that all nail polish, jewelry and body piercings need to be removed prior to coming to the hospital.

## 2021-07-15 NOTE — Progress Notes
Name: Cindy Randolph          MRN: [id number]      DOB: July 08, 1960      AGE: 61 y.o.   DATE OF SERVICE: 07/15/2021    Subjective:             Reason for Visit:  Follow Up      Cindy Randolph is a 61 y.o. female.      Cancer Staging   Malignant neoplasm of left breast in female, estrogen receptor positive (HCC)  Staging form: Breast, AJCC 8th Edition  - Clinical stage from 12/22/2020: Stage IA (cT1c, cN0, cM0, G1, ER+, PR+, HER2-) - Signed by Massie Kluver, MD on 12/22/2020  - Pathologic stage from 02/02/2021: Stage IIIA (pT3, pN3, cM0, G1, ER+, PR+, HER2-) - Signed by Massie Kluver, MD on 05/27/2021      History of Present Illness  Cindy Randolph is a 61yo female with a history of breast cancer on chemotherapy who follows up ~6wks after starting nifedipine/lidocaine for anal fissure. She felt her symptoms significantly improved after 2-3wks of topical nifedipine/liodcaine so she stopped. At her 6wk follow up the fissure was still present. She was asked to resume topical nifedipine/lidocaine and continue for 6wks despite symptom improvement.     She returns to clinic today for follow-up.  She feels that the fissure is larger.  Her symptoms have largely improved however she had 1 episode of bad constipation a few weeks ago and feels that things kind of got reset.  She did complete her radiation for her breast cancer treatment however this left her with some pretty severe esophagitis with altered her diet.  This prompted the episode of constipation.  She is now back having soft bowel movements as the esophagitis is resolving.  Continues to use the topical nifedipine about 2 times a day.  Only had bleeding the 1 time she had a large stool.  Definitely had pain without large stool as well.    03/16/2021 - Med Onc:  I,ve used the Prep H, baths and witch Hazel wipes, but the external hemorrhoid continues, and is frustrating. I?ve dealt with a much smaller, infrequent problem since my last couple of pregnancies but the increased irregularity since Chemo, and more irritated state  of the intestine means I?m chasing constipation vs very soft stool. I?m normally much more regular. It is not strangulated--that did happen once a decade ago and Prep H and sitz baths fixed it. But this is just stubborn. Is there a prescription ointment to switch to that Dr. Neil Crouch could prescribe? I?m past the recommended usage time of PrepH.  To the good- I?ve only needed Zofran or Compazine one a day, if that. Nausea is certainly better these last few days! Re: diet, I eat fruits and vegetables and some whole grains and legumes (little to no white flour/sugar) so I do get natural soluble and insoluble fiber.  Metamucil and or Miralax which used as directed when indicated have proved overrated in results (and make one more crampy).  This really is tiring and I could use one less very annoying consequence of Chemo.  I'm seeing Dr. Essie Christine today at [facility] for a scheduled F/U for my right knee. It still has swelling, & is sore at times. Dealing with so much. But again, in general, it?s much better than the first cycle.   I did shave my head when the hair came off in handfuls this week and I?m dealing  well with that aspect of Chemo--at least! Getting walks and stationary bike sessions in!    2017 - Colonoscopy:    Medical History:   Diagnosis Date   ? Acquired hypothyroidism    ? Anxiety 2016    from pain   ? Back pain    ? Breast cancer in female [facility]) 12/2020    Left   ? Cancer of thyroid (HCC) 1980   ? Degenerative disc disease, cervical 1994   ? Degenerative disc disease, lumbar 2010   ? Degenerative disc disease, thoracic 2010   ? Depression situational, transient    after divorce, after death of 2nd husband   ? Essential hypertension began in 3rd trimester most pregnancies    remained after last pregnancy   ? Essential hypertension, benign as above   ? Generalized headaches 1996-2010    probably hormonally related   ? Heart murmur at birth    Dr. Maisie Fus said he didn't hear it a few years ago   ? Infection    ? Joint pain 1994   ? Other malignant neoplasm without specification of site thyroid, 1980    thyroidectomy   ? Spinal stenosis 2017 to present    per MRIs   ? Thyroid disorder as noted   ? Torn meniscus 12/25/2020   ? Ulcer 1987    with divorce; resolved   ? Unspecified deficiency anemia 2009    from excessive bleeding post-partum; treated iron     Surgical History:   Procedure Laterality Date   ? HX WRIST FRACTURE SURGERY  1993    Baker's thumb with fixation   ? ARTHROPLASTY  2001    L ACL   ? COLONOSCOPY  2017    normal   ? ARTHROSCOPY KNEE WITH PARTIAL LATERAL MENISCECTOMY AND LEFT KNEE INJECTION. Right 01/08/2021    Performed by Vopat, Lowry Ram, MD at [facility] OR   ? BILATERAL TOTAL MASTECTOMIES Bilateral 01/22/2021    Performed by Massie Kluver, MD at [facility] OR   ? INTRAOPERATIVE SENTINEL LYMPH NODE IDENTIFICATION WITH/ WITHOUT NON-RADIOACTIVE DYE INJECTION Left 01/22/2021    Performed by Massie Kluver, MD at [facility] OR   ? INJECTION RADIOACTIVE TRACER FOR SENTINEL NODE IDENTIFICATION Left 01/22/2021    Performed by Massie Kluver, MD at [facility]   ? LEFT AXILLARY SENTINEL LYMPH NODE BIOPSY Left 01/22/2021    Performed by Massie Kluver, MD at [facility]   ? BILATERAL CHEST FLAT CLOSURE Bilateral 01/22/2021    Performed by Erlene Quan, Zachary George, MD at [facility] OR   ? BILATERAL CHEST FLAT CLOSURE x 8 Bilateral 01/22/2021    Performed by Stevenson Clinch, MD at [facility] OR   ? EXCISION BENIGN LESION 0.5 CM OR LESS - TORSO Right 01/22/2021    Performed by Erlene Quan, Zachary George, MD at [facility] OR   ? TUNNELED VENOUS PORT PLACEMENT Right 02/19/2021   ? Placement of port-a-cath - 30F Right 02/19/2021    Performed by Freund, Alecia Lemming., MD at [facility] OR   ? FLUOROSCOPIC GUIDANCE CENTRAL VENOUS ACCESS DEVICE PLACEMENT/ REPLACEMENT/ REMOVAL N/A 02/19/2021    Performed by Flo Shanks, Alecia Lemming., MD at [facility] OR   ? Left Completion Axillary Lymph Node Dissection Left 05/14/2021    Performed by Massie Kluver, MD at [facility] OR   ? REMOVAL TUNNELED CENTRAL VENOUS ACCESS DEVICE INCLUDING PORT/ PUMP Right 05/14/2021    Performed by Massie Kluver, MD  at [facility] OR   ? KNEE SURGERY  L ACL as above   ? THYROIDECTOMY       Family History   Problem Relation Age of Onset   ? Arthritis Paternal Grandmother         (osteoarthritis) multiple heberdens noduls and deformities   ? Back pain Paternal Grandmother    ? Arthritis Mother         wear and tear   ? Back pain Mother    ? Hypertension Mother    ? Joint Pain Mother    ? Neck Pain Mother    ? Cancer-Breast Mother 6        was on HRT for 10-15 years   ? Basal Cell Carcinoma Brother    ? Back pain Brother    ? Hypertension Brother    ? Basal Cell Carcinoma Brother    ? Back pain Brother         has had surgery   ? Hypertension Brother    ? Basal Cell Carcinoma Brother    ? Back pain Brother    ? Hypertension Brother    ? Diabetes Father         Type 2   ? Heart problem Father         CABG 3   ? Heart Disease Father    ? Hypertension Father    ? Diabetes Maternal Grandfather         Type 2   ? Birth Defect Daughter         PRS, congenital diaphragmatic hernia, malrotation, grey matter heterotopia   ? Stroke Maternal Uncle    ? Thyroid Disease Maternal Uncle    ? Diabetes Paternal Grandfather    ? Birth Defect Nephew         craniosynostosis   ? Cancer-Breast Maternal Great-Aunt 35     Social History     Socioeconomic History   ? Marital status: Married   Occupational History   ? Occupation: retired Engineer, civil (consulting)   Tobacco Use   ? Smoking status: Never     Passive exposure: Past   ? Smokeless tobacco: Never   Vaping Use   ? Vaping Use: Never used   Substance and Sexual Activity   ? Alcohol use: Never     Comment: one per month beer   ? Drug use: Never     Comment: in late teens, early twenties, a few times and not since   ? Sexual activity: Yes     Partners: Male     Birth control/protection: Post-menopausal, None Vaping/E-liquid Use   ? Vaping Use Never User                      Review of Systems   Constitutional: Positive for chills and fatigue. Negative for fever and unexpected weight change.   HENT: Positive for congestion, ear pain, postnasal drip, sinus pressure and sore throat.    Eyes: Negative.    Respiratory: Negative.    Cardiovascular: Negative.    Gastrointestinal: Positive for blood in stool, constipation, diarrhea and nausea. Negative for abdominal distention, abdominal pain, anal bleeding, rectal pain and vomiting.   Endocrine: Negative.    Genitourinary: Positive for dysuria.   Musculoskeletal: Positive for arthralgias, back pain, joint swelling and neck pain.   Skin: Positive for color change and rash. Negative for wound.   Allergic/Immunologic: Negative.    Neurological: Positive for headaches. Negative for weakness and  light-headedness.   Hematological: Negative.    Psychiatric/Behavioral: Negative.          Objective:         ? acetaminophen (TYLENOL) 325 mg tablet Take two tablets by mouth every 6 hours. Take scheduled for 3 days after surgery, then as needed. Do not exceed 4,000mg  in a 24 hour period.   ? cholecalciferol (vitamin D3) (VITAMIN D3 PO) Take  by mouth.   ? DIPH/LIDO/ANTACID 1:1:1 ORAL SUSPENSION (COMPOUND) Swish and Swallow 10 mL by mouth as directed as Needed for Mouth Pain. As needed 3-4 times a day   ? letrozole [facility]) 2.5 mg tablet Take one tablet by mouth daily. (Patient taking differently: Take one tablet by mouth daily. will start after surgery)   ? levothyroxine (SYNTHROID) 112 mcg tablet Take one tablet by mouth daily 30 minutes before breakfast.   ? lisinopriL (ZESTRIL) 2.5 mg tablet Take one tablet by mouth twice daily.   ? mupirocin (CENTANY) 2 % topical ointment Apply  topically to affected area twice daily.   ? nifedipine 0.3%/lidocaine 1.5%/white petrolatum(#) Apply a pea sized amount to the anal opening two to four times a day. Do NOT insert into anus.   ? silver sulfADIAZINE (SSD) 1 % topical cream Apply to affected area up to 3-4 times a day as needed.   ? traMADoL (ULTRAM) 50 mg tablet Take one tablet by mouth at bedtime as needed for Pain.   ? vitamins, B complex tab Take one tablet by mouth three times weekly.   ? vitamins, multiple cap Take one capsule by mouth daily.     Vitals:    07/15/21 0812   BP: 125/81   BP Source: Arm, Right Upper   Pulse: 72   Temp: 36.2 ?C (97.1 ?F)   Resp: 14   SpO2: 99%   TempSrc: Temporal   PainSc: Four   Weight: 63.4 kg (139 lb 12.8 oz)     Body mass index is 25.16 kg/m?Marland Kitchen     Pain Score: Four  Pain Loc: Chest    Fatigue Scale: 0-None     Physical Exam  Vitals reviewed.   Constitutional:       General: She is not in acute distress.     Appearance: Normal appearance. She is well-developed.   HENT:      Head: Normocephalic and atraumatic.      Nose: Nose normal.   Eyes:      General: Lids are normal.      Conjunctiva/sclera: Conjunctivae normal.   Pulmonary:      Effort: Pulmonary effort is normal. No respiratory distress.   Genitourinary:     Comments: External: Anterior anal fissure present with point tenderness.   Musculoskeletal:         General: Normal range of motion.      Cervical back: Normal range of motion.   Neurological:      Mental Status: She is alert and oriented to person, place, and time.   Psychiatric:         Speech: Speech normal.         Behavior: Behavior normal.         Thought Content: Thought content normal.         Judgment: Judgment normal.               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    Chronic Anal Fissure:  - Persistent despite consistent  use of nifedipine for the last 8wks. She has used it for a combined 13wks.  - Likely exacerbated by one bad episode of constipation.   - Continue constipation management now.  - Visible fissure with persistent pain on exam. I recommend EUA with botox injection.  - We discussed the risks and benefits of this. She knows to continue to use the topical cream post operatively.   - CONSENT DAY OF SURGERY.     Health Maintenance:  - Discussed repeat colonoscopy, however would not do at same time of fissure.     Plan:  - RTC post operatively.     .The patient and family were allowed to ask questions and voice concerns; these were addressed to the best of our ability. They expressed understanding of what was explained to them, and they agreed with the present plan. Patient has the phone numbers for the Cancer Center and was instructed on how to contact us with any questions or concerns.    My collaborating provider on this patient is Dr. Deedra Ehrich MD.    Brunilda Payor. Fredricka Bonine MSN, APRN, AGCNS-BC, AGPCNP-BC  Advanced Practice Provider  Colon and Rectal Surgery  Surgical Oncology  APP for Dr. Kathee Delton, Dr. Daphine Deutscher and Dr. Daiva Nakayama  Pager: [phone]  Available via Amie Critchley and [facility] [employer]

## 2021-07-15 NOTE — Progress Notes
Subjective:       History of Present Illness  Cindy Randolph is a 61 y.o. female.  Finished radiation.  Had an accelerated course.  Has a wound on the left chest wall in the radiated field.  Minimal drainage, but gets and eschar.      Review of Systems   Constitutional: Negative.    HENT: Positive for sore throat.    Eyes: Negative.    Respiratory: Negative.    Cardiovascular: Negative.    Gastrointestinal: Positive for nausea.   Endocrine: Negative.    Genitourinary: Negative.    Musculoskeletal: Positive for arthralgias.   Skin: Positive for wound.   Allergic/Immunologic: Negative.    Neurological: Negative.    Hematological: Negative.    Psychiatric/Behavioral: Negative.          Objective:         ? acetaminophen (TYLENOL) 325 mg tablet Take two tablets by mouth every 6 hours. Take scheduled for 3 days after surgery, then as needed. Do not exceed 4,000mg  in a 24 hour period.   ? cholecalciferol (vitamin D3) (VITAMIN D3 PO) Take  by mouth.   ? DIPH/LIDO/ANTACID 1:1:1 ORAL SUSPENSION (COMPOUND) Swish and Swallow 10 mL by mouth as directed as Needed for Mouth Pain. As needed 3-4 times a day   ? letrozole [facility]) 2.5 mg tablet Take one tablet by mouth daily. (Patient taking differently: Take one tablet by mouth daily. will start after surgery)   ? levothyroxine (SYNTHROID) 112 mcg tablet Take one tablet by mouth daily 30 minutes before breakfast.   ? lisinopriL (ZESTRIL) 2.5 mg tablet Take one tablet by mouth twice daily.   ? mupirocin (CENTANY) 2 % topical ointment Apply  topically to affected area twice daily.   ? nifedipine 0.3%/lidocaine 1.5%/white petrolatum(#) Apply a pea sized amount to the anal opening two to four times a day. Do NOT insert into anus.   ? silver sulfADIAZINE (SSD) 1 % topical cream Apply to affected area up to 3-4 times a day as needed.   ? traMADoL (ULTRAM) 50 mg tablet Take one tablet by mouth at bedtime as needed for Pain.   ? vitamins, B complex tab Take one tablet by mouth three times weekly.   ? vitamins, multiple cap Take one capsule by mouth daily.     Vitals:    07/15/21 0900   BP: 125/81   Pulse: 72   Temp: 36.2 ?C (97.1 ?F)   TempSrc: Skin   PainSc: Two   Weight: 63 kg (139 lb)   Height: 158.8 cm (5' 2.5)     Body mass index is 25.02 kg/m?Marland Kitchen     Physical Exam  Vitals reviewed.   Constitutional:       General: She is not in acute distress.  Cardiovascular:      Rate and Rhythm: Normal rate.   Pulmonary:      Effort: Pulmonary effort is normal.   Chest:          Comments: Incisions intact.  Except an area on the left chest with exposed dermis and weeping.   No purulence.   No fluid collection.   Neurological:      Mental Status: She is alert.   Psychiatric:         Mood and Affect: Mood normal.         Thought Content: Thought content normal.                  Assessment and  Plan:    For now, I changed her dressing to Aquacel Ag.  Will refer to wound care at [facility].  She is agreeable to go there.  Since this is a radiated wound, she may need attention from wound care clinic.    Will place a referral.     Problem List Items Addressed This Visit     Malignant neoplasm of left breast in female, estrogen receptor positive (HCC) - Primary    Excess skin of breast    Open wound of left chest wall    History of external beam radiation therapy

## 2021-07-17 ENCOUNTER — Encounter: Admit: 2021-07-17 | Discharge: 2021-07-17 | Payer: MEDICARE

## 2021-07-20 ENCOUNTER — Encounter: Admit: 2021-07-20 | Discharge: 2021-07-20 | Payer: MEDICARE

## 2021-07-20 ENCOUNTER — Ambulatory Visit: Admit: 2021-07-20 | Discharge: 2021-07-21 | Payer: MEDICARE

## 2021-07-20 DIAGNOSIS — F32A Depression: Secondary | ICD-10-CM

## 2021-07-20 DIAGNOSIS — C50919 Malignant neoplasm of unspecified site of unspecified female breast: Secondary | ICD-10-CM

## 2021-07-20 DIAGNOSIS — S83209A Unspecified tear of unspecified meniscus, current injury, unspecified knee, initial encounter: Secondary | ICD-10-CM

## 2021-07-20 DIAGNOSIS — M5134 Other intervertebral disc degeneration, thoracic region: Secondary | ICD-10-CM

## 2021-07-20 DIAGNOSIS — F419 Anxiety disorder, unspecified: Secondary | ICD-10-CM

## 2021-07-20 DIAGNOSIS — R011 Cardiac murmur, unspecified: Secondary | ICD-10-CM

## 2021-07-20 DIAGNOSIS — M5030 Other cervical disc degeneration, unspecified cervical region: Secondary | ICD-10-CM

## 2021-07-20 DIAGNOSIS — R519 Generalized headaches: Secondary | ICD-10-CM

## 2021-07-20 DIAGNOSIS — C50112 Malignant neoplasm of central portion of left female breast: Secondary | ICD-10-CM

## 2021-07-20 DIAGNOSIS — IMO0002 Ulcer: Secondary | ICD-10-CM

## 2021-07-20 DIAGNOSIS — L598 Other specified disorders of the skin and subcutaneous tissue related to radiation: Secondary | ICD-10-CM

## 2021-07-20 DIAGNOSIS — E039 Hypothyroidism, unspecified: Secondary | ICD-10-CM

## 2021-07-20 DIAGNOSIS — M4800 Spinal stenosis, site unspecified: Secondary | ICD-10-CM

## 2021-07-20 DIAGNOSIS — C801 Malignant (primary) neoplasm, unspecified: Secondary | ICD-10-CM

## 2021-07-20 DIAGNOSIS — C73 Malignant neoplasm of thyroid gland: Secondary | ICD-10-CM

## 2021-07-20 DIAGNOSIS — I10 Essential (primary) hypertension: Secondary | ICD-10-CM

## 2021-07-20 DIAGNOSIS — D539 Nutritional anemia, unspecified: Secondary | ICD-10-CM

## 2021-07-20 DIAGNOSIS — M5136 Other intervertebral disc degeneration, lumbar region: Secondary | ICD-10-CM

## 2021-07-20 DIAGNOSIS — M549 Dorsalgia, unspecified: Secondary | ICD-10-CM

## 2021-07-20 DIAGNOSIS — M2550 Pain in unspecified joint: Secondary | ICD-10-CM

## 2021-07-20 DIAGNOSIS — B999 Unspecified infectious disease: Secondary | ICD-10-CM

## 2021-07-20 DIAGNOSIS — E079 Disorder of thyroid, unspecified: Secondary | ICD-10-CM

## 2021-07-20 MED ORDER — COLLAGENASE CLOSTRIDIUM HISTO. 250 UNIT/GRAM TP OINT
Freq: Once | TOPICAL | 0 refills | Status: CP
Start: 2021-07-20 — End: ?
  Administered 2021-07-20: 21:00:00 via TOPICAL

## 2021-07-20 NOTE — Progress Notes
Outpatient Burn Wound Clinic Note  Name: Cindy Randolph  MRN: [id number]  DOB: June 21, 1960  Age: 61 y.o.   Date of Service: 07/20/2021     Chief Complaint   Patient presents with   ? Wound Care     1st visit to clinic-Nonhealing surgical from surgery on 05/14/21       Subjective     HISTORY OF PRESENT ILLNESS:    Cindy Randolph is a 61 y.o. 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  She has undergone chemotherapy and XRT (completed mid Feb 2023).  She was referred by Dr. Erlene Quan for evaluation of a left breast non-healing surgical wound (in the area of the JP drain). Previous treatment SSD and most recently Aquacel Ag.  She is scheduled to start Letrozole but wants to defer until left breast wound is healed.    Nutrition: reports normal appetite  Pain: no sig discomfort?    01/22/21 [place]  Procedure(s) (LRB):  BILATERAL TOTAL MASTECTOMIES (Bilateral)  INTRAOPERATIVE SENTINEL LYMPH NODE IDENTIFICATION WITH/ WITHOUT NON-RADIOACTIVE DYE INJECTION (Left)  INJECTION RADIOACTIVE TRACER FOR SENTINEL NODE IDENTIFICATION (Left)  LEFT AXILLARY SENTINEL LYMPH NODE BIOPSY (Left)  BILATERAL CHEST FLAT CLOSURE (Bilateral)  BILATERAL CHEST FLAT CLOSURE x 8 (Bilateral)  EXCISION BENIGN LESION 0.5 CM OR LESS - TORSO (Right)         Review of Systems   Constitutional: Negative.    Skin: Positive for wound.       Allergies   Allergen Reactions   ? Oxycodone NAUSEA ONLY     Prefers tramadol   ? Sudafed [Pseudoephedrine Hcl] PALPITATIONS       Current Outpatient Medications on File Prior to Visit   Medication Sig Dispense Refill   ? acetaminophen (TYLENOL) 325 mg tablet Take two tablets by mouth every 6 hours. Take scheduled for 3 days after surgery, then as needed. Do not exceed 4,000mg  in a 24 hour period. 40 tablet 0   ? ascorbic acid (VITAMIN C PO) Take  by mouth.     ? CALCIUM PO Take  by mouth.     ? cholecalciferol (vitamin D3) (VITAMIN D3 PO) Take  by mouth.     ? DIPH/LIDO/ANTACID 1:1:1 ORAL SUSPENSION (COMPOUND) Swish and Swallow 10 mL by mouth as directed as Needed for Mouth Pain. As needed 3-4 times a day (Patient not taking: Reported on 07/15/2021) 300 mL 3   ? letrozole [facility]) 2.5 mg tablet Take one tablet by mouth daily. (Patient taking differently: Take one tablet by mouth daily. will start after surgery) 90 tablet 3   ? levothyroxine (SYNTHROID) 112 mcg tablet Take one tablet by mouth daily 30 minutes before breakfast.     ? lisinopriL (ZESTRIL) 2.5 mg tablet Take one tablet by mouth twice daily.     ? mupirocin (CENTANY) 2 % topical ointment Apply  topically to affected area twice daily. 15 g 1   ? nifedipine 0.3%/lidocaine 1.5%/white petrolatum(#) Apply a pea sized amount to the anal opening two to four times a day. Do NOT insert into anus. 30 g 0   ? silver sulfADIAZINE (SSD) 1 % topical cream Apply to affected area up to 3-4 times a day as needed. 400 g 3   ? traMADoL (ULTRAM) 50 mg tablet Take one tablet by mouth at bedtime as needed for Pain. 10 tablet 0   ? vitamins, B complex tab Take one  tablet by mouth three times weekly.     ? vitamins, multiple cap Take one capsule by mouth daily.       No current facility-administered medications on file prior to visit.       Medical History:   Diagnosis Date   ? Acquired hypothyroidism    ? Anxiety 2016    from pain   ? Back pain    ? Breast cancer in female [facility]) 12/2020    Left   ? Cancer of thyroid (HCC) 1980   ? Degenerative disc disease, cervical 1994   ? Degenerative disc disease, lumbar 2010   ? Degenerative disc disease, thoracic 2010   ? Depression situational, transient    after divorce, after death of 2nd husband   ? Essential hypertension began in 3rd trimester most pregnancies    remained after last pregnancy   ? Essential hypertension, benign as above   ? Generalized headaches 1996-2010    probably hormonally related   ? Heart murmur at birth    Dr. Maisie Fus said he didn't hear it a few years ago   ? Infection    ? Joint pain 1994   ? Other malignant neoplasm without specification of site thyroid, 1980    thyroidectomy   ? Spinal stenosis 2017 to present    per MRIs   ? Thyroid disorder as noted   ? Torn meniscus 12/25/2020   ? Ulcer 1987    with divorce; resolved   ? Unspecified deficiency anemia 2009    from excessive bleeding post-partum; treated iron       Surgical History:   Procedure Laterality Date   ? HX WRIST FRACTURE SURGERY  1993    Baker's thumb with fixation   ? ARTHROPLASTY  2001    L ACL   ? COLONOSCOPY  2017    normal   ? ARTHROSCOPY KNEE WITH PARTIAL LATERAL MENISCECTOMY AND LEFT KNEE INJECTION. Right 01/08/2021    Performed by Vopat, Lowry Ram, MD at [facility] OR   ? BILATERAL TOTAL MASTECTOMIES Bilateral 01/22/2021    Performed by Massie Kluver, MD at [facility] OR   ? INTRAOPERATIVE SENTINEL LYMPH NODE IDENTIFICATION WITH/ WITHOUT NON-RADIOACTIVE DYE INJECTION Left 01/22/2021    Performed by Massie Kluver, MD at [facility] OR   ? INJECTION RADIOACTIVE TRACER FOR SENTINEL NODE IDENTIFICATION Left 01/22/2021    Performed by Massie Kluver, MD at [facility]   ? LEFT AXILLARY SENTINEL LYMPH NODE BIOPSY Left 01/22/2021    Performed by Massie Kluver, MD at [facility]   ? BILATERAL CHEST FLAT CLOSURE Bilateral 01/22/2021    Performed by Erlene Quan, Zachary George, MD at [facility] OR   ? BILATERAL CHEST FLAT CLOSURE x 8 Bilateral 01/22/2021    Performed by Stevenson Clinch, MD at [facility] OR   ? EXCISION BENIGN LESION 0.5 CM OR LESS - TORSO Right 01/22/2021    Performed by Erlene Quan, Zachary George, MD at [facility] OR   ? TUNNELED VENOUS PORT PLACEMENT Right 02/19/2021   ? Placement of port-a-cath - 50F Right 02/19/2021    Performed by Freund, Alecia Lemming., MD at [facility] OR   ? FLUOROSCOPIC GUIDANCE CENTRAL VENOUS ACCESS DEVICE PLACEMENT/ REPLACEMENT/ REMOVAL N/A 02/19/2021    Performed by Flo Shanks, Alecia Lemming., MD at [facility] OR   ? Left Completion Axillary Lymph Node Dissection Left 05/14/2021    Performed by Massie Kluver, MD at [facility] OR   ?  REMOVAL TUNNELED CENTRAL VENOUS ACCESS DEVICE INCLUDING PORT/ PUMP Right 05/14/2021    Performed by Massie Kluver, MD at [facility] OR   ? KNEE SURGERY  L ACL as above   ? THYROIDECTOMY         family history includes Arthritis in her mother and paternal grandmother; Back pain in her brother, brother, brother, mother, and paternal grandmother; Basal Cell Carcinoma in her brother, brother, and brother; Birth Defect in her daughter and nephew; Physiological scientist (age of onset: 64) in her mother; Essie Christine (age of onset: 72) in her maternal great-aunt; Diabetes in her father, maternal grandfather, and paternal grandfather; Heart Disease in her father; Heart problem in her father; Hypertension in her brother, brother, brother, father, and mother; Joint Pain in her mother; Neck Pain in her mother; Stroke in her maternal uncle; Thyroid Disease in her maternal uncle.    Social History     Socioeconomic History   ? Marital status: Married   Occupational History   ? Occupation: retired Engineer, civil (consulting)   Tobacco Use   ? Smoking status: Never     Passive exposure: Past   ? Smokeless tobacco: Never   Vaping Use   ? Vaping Use: Never used   Substance and Sexual Activity   ? Alcohol use: Never     Comment: one per month beer   ? Drug use: Never     Comment: in late teens, early twenties, a few times and not since   ? Sexual activity: Yes     Partners: Male     Birth control/protection: Post-menopausal, None                   PHYSICAL EXAM:  Vitals:    07/20/21 1423   BP: (!) 151/60   BP Source: Arm, Left Upper   Pulse: 89   Temp: 36.5 ?C (97.7 ?F)   Resp: 18   SpO2: 100%   TempSrc: Oral   PainSc: Three       Pain Score: Three    There is no height or weight on file to calculate BMI.    Physical Exam  Constitutional:       Appearance: Normal appearance. She is normal weight.   HENT:      Head: Normocephalic.   Skin:     Comments: Left breast: superficial wound, minimal depth, 100% slough noted, surrounding tissue with erythema c/w XRT  Moderate drainage   Neurological:      Mental Status: She is alert and oriented to person, place, and time.   Psychiatric:         Mood and Affect: Mood normal.         Behavior: Behavior normal.         Judgment: Judgment normal.                Wounds 07/20/21 1427 Surgical incision Left Chest (Active)   07/20/21 1427 Chest   Wound Type: Surgical incision   Pressure Injury Stages (For Pressure Injury Wound Type Only):    Pressure Injury Present On Inpatient Admission:    If this pressure injury is suspected to be device related, please select the device::    Wound/Pressure Injury Orientation: Left   Wound Location Comments:    ZXWound Location:    Wound Description (Comments):    Wound Type::    Image    07/20/21 1400   Wound Dressing Status Changed 07/20/21 1400   Wound Dressing and / or Treatment Collagenase;Aquacel  AG;Foam 07/20/21 1400   Wound Drainage Description Creamy;Tan 07/20/21 1400   Wound Drainage Amount Moderate 07/20/21 1400   Wound Base Assessment [facility] 07/20/21 1400   Surrounding Skin Assessment Red;Other (Comment) 07/20/21 1400   Wound Status (Wound Team Only) Being Treated 07/20/21 1400   Wound Length (cm) (Wound Team Only) 0.5 cm 07/20/21 1400   Wound Width (cm) (Wound Team Only) 2.8 cm 07/20/21 1400   Wound Depth (cm) (Wound Team Only) 0.3 cm 07/20/21 1400   Wound Surface Area (cm^2) (Wound Team Only) 1.4 cm^2 07/20/21 1400   Wound Volume (cm^3) (Wound Team Only) 0.42 cm^3 07/20/21 1400   Undermining in CM (Wound Team Only) 0 cm 07/20/21 1400   Tunneling in CM (Wound Team Only) 0 cm 07/20/21 1400   Number of days: 0              Wound closure met (95% or greater): No  ALL wounds debrided using: enzymatic    Sodium   Date Value Ref Range Status   06/29/2021 140 137 - 147 MMOL/L Final     Potassium   Date Value Ref Range Status   06/29/2021 3.9 3.5 - 5.1 MMOL/L Final     Chloride   Date Value Ref Range Status   06/29/2021 104 98 - 110 MMOL/L Final     Glucose   Date Value Ref Range Status   06/29/2021 105 (H) 70 - 100 MG/DL Final     Blood Urea Nitrogen   Date Value Ref Range Status   06/29/2021 12 7 - 25 MG/DL Final     Creatinine   Date Value Ref Range Status   06/29/2021 0.71 0.4 - 1.00 MG/DL Final     Calcium   Date Value Ref Range Status   06/29/2021 9.4 8.5 - 10.6 MG/DL Final     Total Protein   Date Value Ref Range Status   06/29/2021 6.9 6.0 - 8.0 G/DL Final     Total Bilirubin   Date Value Ref Range Status   06/29/2021 0.3 0.3 - 1.2 MG/DL Final     Albumin   Date Value Ref Range Status   06/29/2021 4.0 3.5 - 5.0 G/DL Final     Alk Phosphatase   Date Value Ref Range Status   06/29/2021 52 25 - 110 U/L Final     AST (SGOT)   Date Value Ref Range Status   06/29/2021 14 7 - 40 U/L Final     CO2   Date Value Ref Range Status   06/29/2021 28 21 - 30 MMOL/L Final     ALT (SGPT)   Date Value Ref Range Status   06/29/2021 10 7 - 56 U/L Final     Anion Gap   Date Value Ref Range Status   06/29/2021 8 3 - 12 Final     Hematocrit   Date Value Ref Range Status   06/29/2021 37.2 36 - 45 % Final     Hemoglobin   Date Value Ref Range Status   06/29/2021 12.3 12.0 - 15.0 GM/DL Final      No results found for: AMP, BAR, COC, BENZO, PCP, THC, OPI, OPIATES300       IMPRESSION:    1. Soft tissue radionecrosis    2. Malignant neoplasm of central portion of left breast in female, estrogen receptor positive (HCC)    3. History of external beam radiation therapy          PLAN:  Wash left breast with soap and water  Apply Santyl, nickel thick, then Aquacel Ag, Allevyn Foam and change on Mon/Thursday  If you develop any worsening of erythema, please reach out to burn clinic  RTC Thursday March 9

## 2021-07-20 NOTE — Telephone Encounter
Patient left a VM asking [place] to send a ROI to [facility], Dr. Judeen Hammans Zhou's office for thyroid cancer records. Patient has new patient appointment 08/10/21. Records sent with referral are from 2022 and 2021 but do not include op report, pathology or any imaging. ROI faxed to [phone]. Patient requested a reply to my chart or her phone number in chart. My chart message sent.

## 2021-07-20 NOTE — Patient Instructions
Wash left breast with soap and water  Apply Santyl, nickel thick, then Aquacel Ag, Allevyn Foam and change on Mon/Thursday  If you develop any worsening of erythema, please reach out to burn clinic

## 2021-07-21 ENCOUNTER — Encounter: Admit: 2021-07-21 | Discharge: 2021-07-21 | Payer: MEDICARE

## 2021-07-21 DIAGNOSIS — Z923 Personal history of irradiation: Secondary | ICD-10-CM

## 2021-07-21 DIAGNOSIS — S21102A Unspecified open wound of left front wall of thorax without penetration into thoracic cavity, initial encounter: Secondary | ICD-10-CM

## 2021-07-22 NOTE — Progress Notes
Date: 07/30/2021       Cindy Randolph is a 61 y.o. female.     The encounter diagnosis was Malignant neoplasm of central portion of left breast in female, estrogen receptor positive (HCC).  Staging:  Cancer Staging   Malignant neoplasm of left breast in female, estrogen receptor positive (HCC)  Staging form: Breast, AJCC 8th Edition  - Clinical stage from 12/22/2020: Stage IA (cT1c, cN0, cM0, G1, ER+, PR+, HER2-) - Signed by Massie Kluver, MD on 12/22/2020  - Pathologic stage from 02/02/2021: Stage IIIA (pT3, pN3, cM0, G1, ER+, PR+, HER2-) - Signed by Massie Kluver, MD on 05/27/2021        Subjective:           Cancer Staging   Malignant neoplasm of left breast in female, estrogen receptor positive (HCC)  Staging form: Breast, AJCC 8th Edition  - Clinical stage from 12/22/2020: Stage IA (cT1c, cN0, cM0, G1, ER+, PR+, HER2-) - Signed by Massie Kluver, MD on 12/22/2020  - Pathologic stage from 02/02/2021: Stage IIIA (pT3, pN3, cM0, G1, ER+, PR+, HER2-) - Signed by Massie Kluver, MD on 05/27/2021        History of Present Illness    Treating Physicians: Drs. Balanoff, Donneta Romberg  ?  Diagnosis:   61 y.o. female with cT1cN0 ILC of left breast (ER+ HER2- with Ki-67 of 2-5%) status post bilateral mastectomy and L SLNB. Pathology showed 7.4 cm grade 1 ILC, resected with ideal margins, negative LVSI, 5/5 sentinel lymph nodes involved (largest metastatic deposit measuring 1.8 cm with positive ECE). She completed adjuvant ddAC then had left ALND for which pathology revealed 6/14 positive lymph nodes (total of 11/19 positive lymph nodes). Stage [id number]. She was referred for a discussion regarding adjuvant radiotherapy.     Treatment History:   Left CW + Nodes (3D): 06/10/21 - 07/01/21  266 cGy in 16 fractions. 4256 cGy in total.      07/01/2021   Course ID C1-Left CW   Plan ID Lt [email or web address]   Prescribed Dose per Fraction 2.66   Fractions Treated to Date 16   Total Fractions on Plan 16   Reference Point ID Lt Sclav   Session Dosage Given 2.66   First Treatment Date 06-10-2021 02:17PM   Last Treatment Date 07-01-2021 10:36AM   Treatment Elapsed Days 21     Trial: IIT Hypofractionated radiation therapy for patients with breast cancer receiving regional nodal irradiation.    Interruptions: No    RX during treatment: SSD, Magic Mouthwash, and Tramadol     Patient underwent adjuvant radiotherapy to her left breast to 4256 cGy in 16 fractions using 3D conformal radiation treatment planning. She tolerated treatment well and had no treatment interruptions. As expected, she had odynophagia, mild erythema of treatment area without desquamation. She was prescribed Magic Mouthwash, SSD cream and 2.5% hydrocortisone cream.    Interval History:   [name] returns today for routine follow up after completing radiation therapy on 07/01/2021. She is doing well overall and feels she is improving. She continues to have some fatigue but has been able to resume some of her previous activities like cooking. She notes that she often feels she needs to rest after periods of increased activity. She reports ongoing post-nasal drip as well as some throat pain on the right side, though this has improved a lot. She notes some difficulty with swallowing larger bits of meat though this has not been a big issue. She notes  some continues mild erythema of the left chest wall along with some peeling of the skin. She feels this has improved. She continues to follow with a wound care nurse and has noticed marked improvement of the wound on her left lateral chest wall. She mentions that movement of her left arm feels tighter than her right but she feels range of motion remains adequate. She has had continued UTI symptoms, particularly pain/tenderness in her flank. She was seen in urgent care about 1 week ago and was told her UA was abnormal but urine culture did not grow anything. She has f/u with her PCP in this regard tomorrow.     She has not yet started taking Letrozole. She had planned to do this but with ongoing UTI symptoms as well as a migraine last week she has deferred as she is concerned about developing side effects from the medication as well. She plans to start this possibly this weekend or after her birthday next week. She will also be starting abemaciclib per Dr. Dorise Hiss recommendation.    Radiology:  No new imaging to review.       Review of Systems   Constitutional: Positive for fatigue.   HENT: Positive for postnasal drip and sore throat. Negative for congestion, ear pain and sneezing.    Eyes: Negative.    Respiratory: Negative.    Cardiovascular: Negative.    Gastrointestinal: Negative.    Endocrine: Negative.    Genitourinary: Positive for dyspareunia. Negative for frequency and urgency.   Musculoskeletal: Negative for arthralgias and back pain.   Skin: Positive for wound (left chest wall).   Allergic/Immunologic: Negative.    Neurological: Negative.    Hematological: Negative.    Psychiatric/Behavioral: Negative.          Objective:         ? acetaminophen (TYLENOL) 325 mg tablet Take two tablets by mouth every 6 hours. Take scheduled for 3 days after surgery, then as needed. Do not exceed 4,000mg  in a 24 hour period.   ? ascorbic acid (VITAMIN C PO) Take  by mouth.   ? CALCIUM PO Take  by mouth.   ? cholecalciferol (vitamin D3) (VITAMIN D3 PO) Take  by mouth.   ? DIPH/LIDO/ANTACID 1:1:1 ORAL SUSPENSION (COMPOUND) Swish and Swallow 10 mL by mouth as directed as Needed for Mouth Pain. As needed 3-4 times a day   ? letrozole [facility]) 2.5 mg tablet Take one tablet by mouth daily. (Patient taking differently: Take one tablet by mouth daily. will start after surgery)   ? levothyroxine (SYNTHROID) 112 mcg tablet Take one tablet by mouth daily 30 minutes before breakfast.   ? lisinopriL (ZESTRIL) 2.5 mg tablet Take one tablet by mouth twice daily.   ? mupirocin (CENTANY) 2 % topical ointment Apply  topically to affected area twice daily.   ? nifedipine 0.3%/lidocaine 1.5%/white petrolatum(#) Apply a pea sized amount to the anal opening two to four times a day. Do NOT insert into anus.   ? silver sulfADIAZINE (SSD) 1 % topical cream Apply to affected area up to 3-4 times a day as needed.   ? traMADoL (ULTRAM) 50 mg tablet Take one tablet by mouth at bedtime as needed for Pain.   ? vitamins, B complex tab Take one tablet by mouth three times weekly.   ? vitamins, multiple cap Take one capsule by mouth daily.     Vitals:    07/30/21 1507   BP: 123/82   BP Source: Arm, Right  Upper   Pulse: 74   Temp: 36.8 ?C (98.3 ?F)   Resp: 16   SpO2: 99%   TempSrc: Oral   PainSc: Two   Weight: 64.9 kg (143 lb)     Body mass index is 25.74 kg/m?Marland Kitchen     Pain Score: Two  Pain Loc: Throat     Fatigue Scale: 4    KARNOFSKY PERFORMANCE SCORE:  90% Able to carry on normal activity; minor signs of disease     Physical Exam  Vitals reviewed. Exam conducted with a chaperone present.   Constitutional:       General: She is not in acute distress.     Appearance: Normal appearance. She is not ill-appearing.   HENT:      Head: Normocephalic and atraumatic.      Right Ear: External ear normal.      Left Ear: External ear normal.   Eyes:      Extraocular Movements: Extraocular movements intact.   Pulmonary:      Effort: Pulmonary effort is normal. No respiratory distress.   Chest:   Breasts:     Left: Swelling and skin change present.      Comments: Bilateral mastectomy. Slight erythema to left anterior chest wall, wound covered by dressing on left lateral chest wall  Musculoskeletal:      Cervical back: Neck supple.   Lymphadenopathy:      Cervical: No cervical adenopathy.      Upper Body:      Right upper body: No supraclavicular, axillary or pectoral adenopathy.      Left upper body: No supraclavicular, axillary or pectoral adenopathy.   Skin:     General: Skin is warm and dry.   Neurological:      General: No focal deficit present.      Mental Status: She is alert and oriented to person, place, and time.   Psychiatric:         Mood and Affect: Mood normal.          Vitals:    07/30/21 1507   BP: 123/82   BP Source: Arm, Right Upper   Pulse: 74   Temp: 36.8 ?C (98.3 ?F)   Resp: 16   SpO2: 99%   TempSrc: Oral   PainSc: Two   Weight: 64.9 kg (143 lb)              Assessment and Plan:  Kirsti Deleeuw is a 61 y/o female with hx of stage IIIA ILC (ER+, HER2- with Ki-67 of 2-5%) s/p bilateral mastectomy and L SLNB. She completed adjuvant chemotherapy followed by left ALND with 6/14 positive nodes and was referred for radiation therapy. She has now completed radiation therapy with 16 fractions and 4256 cGy in total to the left chest wall and lymph nodes from 06/08/2021 to 07/01/2021.    - Ms. Brusso was seen today in routine follow-up after completion of radiation therapy on 07/01/2021.  At this time, she is clinically stable with no clinical evidence of disease recurrence or progression. She has mild erythema of the left chest wall with some peeling as well as a healing wound on the left lateral chest wall. She will continue to follow with wound care for management of this.   - We will plan for the patient to return to our clinic in 6 months for continued follow-up per the clinical trial protocol. Patient may contact us in the interim if there are any questions or concerns requiring  earlier assessment.  - Patient will continue to follow with other managing providers, including Dr. Donneta Romberg with medical oncology. She is planned to start Letrozole in the near future with addition of abemaciclib after 6-8 weeks on AI if tolerated. She will also receive bisphosphonate therapy while on AI due to osteoporosis.  - Ongoing urinary symptoms, possible residual UTI? Plan for f/u with PCP tomorrow.      This note was created with partial dictation using a dragon dictation system, please notify us if you notice any errors of omissions or content.    Total Time Today was 40 minutes in the following activities: Preparing to see the patient, Obtaining and/or reviewing separately obtained history, Performing a medically appropriate examination and/or evaluation, Counseling and educating the patient/family/caregiver, Ordering medications, tests, or procedures, Referring and communication with other health care professionals (when not separately reported), Documenting clinical information in the electronic or other health record, Independently interpreting results (not separately reported) and communicating results to the patient/family/caregiver and Care coordination (not separately reported).       Bosie Helper MD  Radiation Oncology  [address]  [place], [place] [address]  Clinic: [phone]

## 2021-07-23 ENCOUNTER — Encounter: Admit: 2021-07-23 | Discharge: 2021-07-23 | Payer: MEDICARE

## 2021-07-23 NOTE — Telephone Encounter
Pt has been seen by wound clinic with the following plan: Apply Santyl, nickel thick, then Aquacel Ag, Allevyn Foam and change on Mon/Thursday. She reports her left breast wound has improved and she returns to [facility] 3/9. She appreciates the follow up.

## 2021-07-29 ENCOUNTER — Encounter: Admit: 2021-07-29 | Discharge: 2021-07-29 | Payer: MEDICARE

## 2021-07-29 NOTE — Telephone Encounter
Cindy Randolph was called and advised due to unexpected schedule changes, her DOS on 3/24 with Dr. Hassell Done will need to be rescheduled. She was offered 3/20 as alternative date. She would not like to postpone her endocrinology visit that day as she has been waiting awhile to get in with Dr. Kalman Shan. She was offered next available date 09/16/2021 with DR. Hassell Done, but declined.     Pt is okay to cancel procedure at this time as she feels her anal fissure has really gotten "a lot" better and has also been more compliant with her daily mirlax and bowel regimen needs.     Pt okay to reschedule to date in end of April or May if Cindy Sarna, NP feels it is still appropriate to proceed with Botox injection after seeing he on 3/15.     Pt aware case will be cancelled from 3/24 and if needs to be re-ordered/rescheduled, can discuss with Cindy Sarna, NP on 3/15.

## 2021-07-30 ENCOUNTER — Encounter: Admit: 2021-07-30 | Discharge: 2021-07-30 | Payer: MEDICARE

## 2021-07-30 ENCOUNTER — Ambulatory Visit: Admit: 2021-07-30 | Discharge: 2021-07-31 | Payer: MEDICARE

## 2021-07-30 ENCOUNTER — Ambulatory Visit: Admit: 2021-07-30 | Discharge: 2021-07-30 | Payer: MEDICARE

## 2021-07-30 NOTE — Progress Notes
Outpatient Burn Wound Clinic Note  Name: Cindy Randolph  MRN: [id number]  DOB: 08-14-1960  Age: 61 y.o.   Date of Service: 07/30/2021     Chief Complaint   Patient presents with   ? Wound Care       Subjective     HISTORY OF PRESENT ILLNESS:    Cindy Randolph is a 60 y.o. 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  She has undergone chemotherapy and XRT (completed mid Feb 2023).  She was referred by Dr. Erlene Quan for evaluation of a left breast non-healing surgical wound (in the area of the JP drain).   Pain: no sig discomfort?  Started Santyl and Aquacel Ag last week with excellent results.  Should be able to start Letrozole.    01/22/21 [place]  Procedure(s) (LRB):  BILATERAL TOTAL MASTECTOMIES (Bilateral)  INTRAOPERATIVE SENTINEL LYMPH NODE IDENTIFICATION WITH/ WITHOUT NON-RADIOACTIVE DYE INJECTION (Left)  INJECTION RADIOACTIVE TRACER FOR SENTINEL NODE IDENTIFICATION (Left)  LEFT AXILLARY SENTINEL LYMPH NODE BIOPSY (Left)  BILATERAL CHEST FLAT CLOSURE (Bilateral)         Review of Systems   Constitutional: Negative.    Skin: Positive for wound.       Allergies   Allergen Reactions   ? Oxycodone NAUSEA ONLY     Prefers tramadol   ? Sudafed [Pseudoephedrine Hcl] PALPITATIONS       Current Outpatient Medications on File Prior to Visit   Medication Sig Dispense Refill   ? acetaminophen (TYLENOL) 325 mg tablet Take two tablets by mouth every 6 hours. Take scheduled for 3 days after surgery, then as needed. Do not exceed 4,000mg  in a 24 hour period. 40 tablet 0   ? ascorbic acid (VITAMIN C PO) Take  by mouth.     ? CALCIUM PO Take  by mouth.     ? cholecalciferol (vitamin D3) (VITAMIN D3 PO) Take  by mouth.     ? DIPH/LIDO/ANTACID 1:1:1 ORAL SUSPENSION (COMPOUND) Swish and Swallow 10 mL by mouth as directed as Needed for Mouth Pain. As needed 3-4 times a day 300 mL 3   ? letrozole [facility]) 2.5 mg tablet Take one tablet by mouth daily. (Patient taking differently: Take one tablet by mouth daily. will start after surgery) 90 tablet 3   ? levothyroxine (SYNTHROID) 112 mcg tablet Take one tablet by mouth daily 30 minutes before breakfast.     ? lisinopriL (ZESTRIL) 2.5 mg tablet Take one tablet by mouth twice daily.     ? mupirocin (CENTANY) 2 % topical ointment Apply  topically to affected area twice daily. 15 g 1   ? nifedipine 0.3%/lidocaine 1.5%/white petrolatum(#) Apply a pea sized amount to the anal opening two to four times a day. Do NOT insert into anus. 30 g 0   ? silver sulfADIAZINE (SSD) 1 % topical cream Apply to affected area up to 3-4 times a day as needed. 400 g 3   ? traMADoL (ULTRAM) 50 mg tablet Take one tablet by mouth at bedtime as needed for Pain. 10 tablet 0   ? vitamins, B complex tab Take one tablet by mouth three times weekly.     ? vitamins, multiple cap Take one capsule by mouth daily.       No current facility-administered medications on file prior to visit.       Medical History:   Diagnosis Date   ? Acquired  hypothyroidism    ? Anxiety 2016    from pain   ? Back pain    ? Breast cancer in female [facility]) 12/2020    Left   ? Cancer of thyroid (HCC) 1980   ? Degenerative disc disease, cervical 1994   ? Degenerative disc disease, lumbar 2010   ? Degenerative disc disease, thoracic 2010   ? Depression situational, transient    after divorce, after death of 2nd husband   ? Essential hypertension began in 3rd trimester most pregnancies    remained after last pregnancy   ? Essential hypertension, benign as above   ? Generalized headaches 1996-2010    probably hormonally related   ? Heart murmur at birth    Dr. Maisie Fus said he didn't hear it a few years ago   ? Infection    ? Joint pain 1994   ? Other malignant neoplasm without specification of site thyroid, 1980    thyroidectomy   ? Spinal stenosis 2017 to present    per MRIs   ? Thyroid disorder as noted   ? Torn meniscus 12/25/2020   ? Ulcer 1987    with divorce; resolved   ? Unspecified deficiency anemia 2009    from excessive bleeding post-partum; treated iron       Surgical History:   Procedure Laterality Date   ? HX WRIST FRACTURE SURGERY  1993    Baker's thumb with fixation   ? ARTHROPLASTY  2001    L ACL   ? COLONOSCOPY  2017    normal   ? ARTHROSCOPY KNEE WITH PARTIAL LATERAL MENISCECTOMY AND LEFT KNEE INJECTION. Right 01/08/2021    Performed by Vopat, Lowry Ram, MD at [facility]   ? BILATERAL TOTAL MASTECTOMIES Bilateral 01/22/2021    Performed by Massie Kluver, MD at [facility] OR   ? INTRAOPERATIVE SENTINEL LYMPH NODE IDENTIFICATION WITH/ WITHOUT NON-RADIOACTIVE DYE INJECTION Left 01/22/2021    Performed by Massie Kluver, MD at [facility]   ? INJECTION RADIOACTIVE TRACER FOR SENTINEL NODE IDENTIFICATION Left 01/22/2021    Performed by Massie Kluver, MD at [facility]   ? LEFT AXILLARY SENTINEL LYMPH NODE BIOPSY Left 01/22/2021    Performed by Massie Kluver, MD at [facility]   ? BILATERAL CHEST FLAT CLOSURE Bilateral 01/22/2021    Performed by Erlene Quan, Zachary George, MD at [facility]   ? BILATERAL CHEST FLAT CLOSURE x 8 Bilateral 01/22/2021    Performed by Stevenson Clinch, MD at [facility]   ? EXCISION BENIGN LESION 0.5 CM OR LESS - TORSO Right 01/22/2021    Performed by Erlene Quan, Zachary George, MD at [facility]   ? TUNNELED VENOUS PORT PLACEMENT Right 02/19/2021   ? Placement of port-a-cath - 80F Right 02/19/2021    Performed by Freund, Alecia Lemming., MD at [facility] OR   ? FLUOROSCOPIC GUIDANCE CENTRAL VENOUS ACCESS DEVICE PLACEMENT/ REPLACEMENT/ REMOVAL N/A 02/19/2021    Performed by Flo Shanks, Alecia Lemming., MD at [facility] OR   ? Left Completion Axillary Lymph Node Dissection Left 05/14/2021    Performed by Massie Kluver, MD at [facility]   ? REMOVAL TUNNELED CENTRAL VENOUS ACCESS DEVICE INCLUDING PORT/ PUMP Right 05/14/2021    Performed by Massie Kluver, MD at [facility] OR   ? KNEE SURGERY  L ACL as above   ? THYROIDECTOMY         family history includes Arthritis in her  mother and paternal grandmother; Back pain in her brother, brother, brother, mother, and paternal grandmother; Basal Cell Carcinoma in her brother, brother, and brother; Birth Defect in her daughter and nephew; Essie Christine (age of onset: 40) in her mother; Essie Christine (age of onset: 55) in her maternal great-aunt; Diabetes in her father, maternal grandfather, and paternal grandfather; Heart Disease in her father; Heart problem in her father; Hypertension in her brother, brother, brother, father, and mother; Joint Pain in her mother; Neck Pain in her mother; Stroke in her maternal uncle; Thyroid Disease in her maternal uncle.    Social History     Socioeconomic History   ? Marital status: Married   Occupational History   ? Occupation: retired Engineer, civil (consulting)   Tobacco Use   ? Smoking status: Never     Passive exposure: Past   ? Smokeless tobacco: Never   Vaping Use   ? Vaping Use: Never used   Substance and Sexual Activity   ? Alcohol use: Never     Comment: one per month beer   ? Drug use: Never     Comment: in late teens, early twenties, a few times and not since   ? Sexual activity: Yes     Partners: Male     Birth control/protection: Post-menopausal, None                   PHYSICAL EXAM:  Vitals:    07/30/21 1419   BP: 129/71   Pulse: 75   Resp: 20   SpO2: 98%   PainSc: One       Pain Score: One    There is no height or weight on file to calculate BMI.    Physical Exam  Constitutional:       Appearance: Normal appearance. She is normal weight.   Skin:     Comments: Left breast:  99% healed, wound bed pink, surrounding scar tissue intact   Neurological:      Mental Status: She is alert and oriented to person, place, and time.   Psychiatric:         Mood and Affect: Mood normal.         Behavior: Behavior normal.                Wounds 07/20/21 1427 Surgical incision Left Chest (Active)   07/20/21 1427 Chest   Wound Type: Surgical incision   Pressure Injury Stages (For Pressure Injury Wound Type Only):    Pressure Injury Present On Inpatient Admission: If this pressure injury is suspected to be device related, please select the device::    Wound/Pressure Injury Orientation: Left   Wound Location Comments:    ZXWound Location:    Wound Description (Comments):    Wound Type::    Image   07/30/21 1400   Wound Dressing Status Changed 07/30/21 1400   Wound Dressing and / or Treatment Collagenase;Foam 07/30/21 1400   Wound Drainage Description Sanguineous 07/30/21 1400   Wound Drainage Amount Small 07/30/21 1400   Wound Base Assessment Moist;Granulation 07/30/21 1400   Surrounding Skin Assessment Intact 07/30/21 1400   Wound Site Closure None 07/30/21 1400   Wound Status (Wound Team Only) Being Treated 07/30/21 1400   Wound Length (cm) (Wound Team Only) 0.3 cm 07/30/21 1400   Wound Width (cm) (Wound Team Only) 0.1 cm 07/30/21 1400   Wound Depth (cm) (Wound Team Only) 0.1 cm 07/30/21 1400   Wound Surface Area (cm^2) (Wound Team Only) 0.03 cm^2 07/30/21  1400   Wound Volume (cm^3) (Wound Team Only) 0.003 cm^3 07/30/21 1400   Wound Healing % (Wound Team Only) 99.29 07/30/21 1400   Undermining in CM (Wound Team Only) 0 cm 07/30/21 1400   Tunneling in CM (Wound Team Only) 0 cm 07/30/21 1400   Number of days: 10              Wound closure met (95% or greater): Yes  ALL wounds debrided using: enzymatic    Sodium   Date Value Ref Range Status   06/29/2021 140 137 - 147 MMOL/L Final     Potassium   Date Value Ref Range Status   06/29/2021 3.9 3.5 - 5.1 MMOL/L Final     Chloride   Date Value Ref Range Status   06/29/2021 104 98 - 110 MMOL/L Final     Glucose   Date Value Ref Range Status   06/29/2021 105 (H) 70 - 100 MG/DL Final     Blood Urea Nitrogen   Date Value Ref Range Status   06/29/2021 12 7 - 25 MG/DL Final     Creatinine   Date Value Ref Range Status   06/29/2021 0.71 0.4 - 1.00 MG/DL Final     Calcium   Date Value Ref Range Status   06/29/2021 9.4 8.5 - 10.6 MG/DL Final     Total Protein   Date Value Ref Range Status   06/29/2021 6.9 6.0 - 8.0 G/DL Final     Total Bilirubin   Date Value Ref Range Status   06/29/2021 0.3 0.3 - 1.2 MG/DL Final     Albumin   Date Value Ref Range Status   06/29/2021 4.0 3.5 - 5.0 G/DL Final     Alk Phosphatase   Date Value Ref Range Status   06/29/2021 52 25 - 110 U/L Final     AST (SGOT)   Date Value Ref Range Status   06/29/2021 14 7 - 40 U/L Final     CO2   Date Value Ref Range Status   06/29/2021 28 21 - 30 MMOL/L Final     ALT (SGPT)   Date Value Ref Range Status   06/29/2021 10 7 - 56 U/L Final     Anion Gap   Date Value Ref Range Status   06/29/2021 8 3 - 12 Final     Hematocrit   Date Value Ref Range Status   06/29/2021 37.2 36 - 45 % Final     Hemoglobin   Date Value Ref Range Status   06/29/2021 12.3 12.0 - 15.0 GM/DL Final      No results found for: AMP, BAR, COC, BENZO, PCP, THC, OPI, OPIATES300       IMPRESSION:    1. Soft tissue radionecrosis          PLAN:   Orders Placed This Encounter   ? Apply Dressing     Wash left breast with soap and water  Apply Santyl, nickel thick, then Allevyn Foam and change on Mon/Thursday  If you develop any worsening of erythema, please reach out to burn clinic  RTC Wed March 22 or sooner if needed

## 2021-07-30 NOTE — Progress Notes
..  This prechart is intended to be a reference for patient appointments. Information is gathered from in chart as well as external records review. Information will be clarified/verified/updated in final documentation in the office visit.     Pre Clinic Pre Chart:    [name] is a 61yo female with a history of breast cancer on chemotherapy who follows up ~6wks after starting nifedipine/lidocaine for anal fissure. She felt her symptoms significantly improved after 2-3wks of topical nifedipine/liodcaine so she stopped. At her 6wk follow up the fissure was still present. She was asked to resume topical nifedipine/lidocaine and continue for 6wks despite symptom improvement.     Problem List:  Fissure  Breast Cancer    03/16/2021 - Med Onc:  I,ve used the Prep H, baths and witch Hazel wipes, but the external hemorrhoid continues, and is frustrating. I?ve dealt with a much smaller, infrequent problem since my last couple of pregnancies but the increased irregularity since Chemo, and more irritated state  of the intestine means I?m chasing constipation vs very soft stool. I?m normally much more regular. It is not strangulated--that did happen once a decade ago and Prep H and sitz baths fixed it. But this is just stubborn. Is there a prescription ointment to switch to that Dr. Neil Crouch could prescribe? I?m past the recommended usage time of PrepH.  To the good- I?ve only needed Zofran or Compazine one a day, if that. Nausea is certainly better these last few days! Re: diet, I eat fruits and vegetables and some whole grains and legumes (little to no white flour/sugar) so I do get natural soluble and insoluble fiber.  Metamucil and or Miralax which used as directed when indicated have proved overrated in results (and make one more crampy).  This really is tiring and I could use one less very annoying consequence of Chemo.  I'm seeing Dr. Essie Christine today at [id number] [facility] for a scheduled F/U for my right knee. It still has swelling, & is sore at times. Dealing with so much. But again, in general, it?s much better than the first cycle.   I did shave my head when the hair came off in handfuls this week and I?m dealing well with that aspect of Chemo--at least! Getting walks and stationary bike sessions in!    2017 - Colonoscopy:

## 2021-08-04 ENCOUNTER — Encounter: Admit: 2021-08-04 | Discharge: 2021-08-04 | Payer: MEDICARE

## 2021-08-04 NOTE — Progress Notes
RECEIVED MESSAGE THAT POST OP APPOINTMENT FOR 09/30/21 WAS CANCELLED AND ASKED TO GET PATIENT RESCHEDULED.  I WAS INFORMED BY K. GALEMORE THAT SHE WAS NO LONGER ON THE BOOKS FOR SURGERY AND THAT POST OP WAS NOT NEEDED.  THE PATIENT WILL ALSO BE FOLLOWING UP W/EMILY CONNOR.  RRC

## 2021-08-05 ENCOUNTER — Encounter: Admit: 2021-08-05 | Discharge: 2021-08-05 | Payer: MEDICARE

## 2021-08-05 DIAGNOSIS — IMO0002 Ulcer: Secondary | ICD-10-CM

## 2021-08-05 DIAGNOSIS — S83209A Unspecified tear of unspecified meniscus, current injury, unspecified knee, initial encounter: Secondary | ICD-10-CM

## 2021-08-05 DIAGNOSIS — C50919 Malignant neoplasm of unspecified site of unspecified female breast: Secondary | ICD-10-CM

## 2021-08-05 DIAGNOSIS — I10 Essential (primary) hypertension: Secondary | ICD-10-CM

## 2021-08-05 DIAGNOSIS — M549 Dorsalgia, unspecified: Secondary | ICD-10-CM

## 2021-08-05 DIAGNOSIS — M4800 Spinal stenosis, site unspecified: Secondary | ICD-10-CM

## 2021-08-05 DIAGNOSIS — C801 Malignant (primary) neoplasm, unspecified: Secondary | ICD-10-CM

## 2021-08-05 DIAGNOSIS — M5136 Other intervertebral disc degeneration, lumbar region: Secondary | ICD-10-CM

## 2021-08-05 DIAGNOSIS — M5134 Other intervertebral disc degeneration, thoracic region: Secondary | ICD-10-CM

## 2021-08-05 DIAGNOSIS — R519 Generalized headaches: Secondary | ICD-10-CM

## 2021-08-05 DIAGNOSIS — E039 Hypothyroidism, unspecified: Secondary | ICD-10-CM

## 2021-08-05 DIAGNOSIS — D539 Nutritional anemia, unspecified: Secondary | ICD-10-CM

## 2021-08-05 DIAGNOSIS — F419 Anxiety disorder, unspecified: Secondary | ICD-10-CM

## 2021-08-05 DIAGNOSIS — M5030 Other cervical disc degeneration, unspecified cervical region: Secondary | ICD-10-CM

## 2021-08-05 DIAGNOSIS — R011 Cardiac murmur, unspecified: Secondary | ICD-10-CM

## 2021-08-05 DIAGNOSIS — F32A Depression: Secondary | ICD-10-CM

## 2021-08-05 DIAGNOSIS — B999 Unspecified infectious disease: Secondary | ICD-10-CM

## 2021-08-05 DIAGNOSIS — M2550 Pain in unspecified joint: Secondary | ICD-10-CM

## 2021-08-05 DIAGNOSIS — C73 Malignant neoplasm of thyroid gland: Secondary | ICD-10-CM

## 2021-08-05 DIAGNOSIS — Z85850 Personal history of malignant neoplasm of thyroid: Secondary | ICD-10-CM

## 2021-08-05 DIAGNOSIS — E079 Disorder of thyroid, unspecified: Secondary | ICD-10-CM

## 2021-08-05 DIAGNOSIS — K602 Anal fissure, unspecified: Secondary | ICD-10-CM

## 2021-08-05 LAB — BASIC METABOLIC PANEL
BLD UREA NITROGEN: 17 mg/dL (ref 7–25)
CALCIUM: 10 mg/dL (ref 8.5–10.6)
CO2: 26 MMOL/L (ref 21–30)
CREATININE: 0.8 mg/dL (ref 0.4–1.00)
EGFR: >60 mL/min (ref >60)
GLUCOSE,PANEL: 86 mg/dL (ref 70–100)
POTASSIUM: 4 MMOL/L (ref 3.5–5.1)
SODIUM: 135 MMOL/L — ABNORMAL LOW (ref 137–147)

## 2021-08-05 LAB — FREE T4 (FREE THYROXINE) ONLY: FREE T4: 2.6 ng/dL — ABNORMAL HIGH (ref 0.6–1.6)

## 2021-08-05 LAB — THYROID STIMULATING HORMONE-TSH: TSH: 0.8 uU/mL (ref 0.35–5.00)

## 2021-08-05 NOTE — Progress Notes
Name: Cindy Randolph          MRN: [id number]      DOB: 09-Mar-1961      AGE: 61 y.o.   DATE OF SERVICE: 08/05/2021    Subjective:             Reason for Visit:  Follow Up      Cindy Randolph is a 61 y.o. female.      Cancer Staging   Malignant neoplasm of left breast in female, estrogen receptor positive (HCC)  Staging form: Breast, AJCC 8th Edition  - Clinical stage from 12/22/2020: Stage IA (cT1c, cN0, cM0, G1, ER+, PR+, HER2-) - Signed by Massie Kluver, MD on 12/22/2020  - Pathologic stage from 02/02/2021: Stage IIIA (pT3, pN3, cM0, G1, ER+, PR+, HER2-) - Signed by Massie Kluver, MD on 05/27/2021      History of Present Illness  Cindy Randolph is a 61yo female with a history of breast cancer on chemotherapy who follows up ~6wks after starting nifedipine/lidocaine for anal fissure. She felt her symptoms significantly improved after 2-3wks of topical nifedipine/liodcaine so she stopped. At her 6wk follow up the fissure was still present. She was asked to resume topical nifedipine/lidocaine and continue for 6wks despite symptom improvement.  At follow-up there is a persistent fissure on exam and she was set up to have a Botox.    She returns to clinic today for recheck prior to her Botox injection.  She has been more religious about taking her MiraLAX and making sure her stools are soft and easy.  She is also been more meticulous about using the nifedipine cream at least twice a day.  She feels that things are a Randolph better.  Feels that the area of pain is much smaller when she applies the cream.  She is hoping that she can cancel her Botox injection.  She has not had any large or hard stools.  Therefore she has not had any anal pain or bleeding.    03/16/2021 - Med Onc:  I,ve used the Prep H, baths and witch Hazel wipes, but the external hemorrhoid continues, and is frustrating. I?ve dealt with a much smaller, infrequent problem since my last couple of pregnancies but the increased irregularity since Chemo, and more irritated state  of the intestine means I?m chasing constipation vs very soft stool. I?m normally much more regular. It is not strangulated--that did happen once a decade ago and Prep H and sitz baths fixed it. But this is just stubborn. Is there a prescription ointment to switch to that Dr. Neil Crouch could prescribe? I?m past the recommended usage time of PrepH.  To the good- I?ve only needed Zofran or Compazine one a day, if that. Nausea is certainly better these last few days! Re: diet, I eat fruits and vegetables and some whole grains and legumes (little to no white flour/sugar) so I do get natural soluble and insoluble fiber.  Metamucil and or Miralax which used as directed when indicated have proved overrated in results (and make one more crampy).  This really is tiring and I could use one less very annoying consequence of Chemo.  I'm seeing Dr. Essie Christine today at [facility] for a scheduled F/U for my right knee. It still has swelling, & is sore at times. Dealing with so much. But again, in general, it?s much better than the first cycle.   I did shave my head when the hair came off in handfuls this week  and I?m dealing well with that aspect of Chemo--at least! Getting walks and stationary bike sessions in!    2017 - Colonoscopy:    Medical History:   Diagnosis Date   ? Acquired hypothyroidism    ? Anxiety 2016    from pain   ? Back pain    ? Breast cancer in female [facility]) 12/2020    Left   ? Cancer of thyroid (HCC) 1980   ? Degenerative disc disease, cervical 1994   ? Degenerative disc disease, lumbar 2010   ? Degenerative disc disease, thoracic 2010   ? Depression situational, transient    after divorce, after death of 2nd husband   ? Essential hypertension began in 3rd trimester most pregnancies    remained after last pregnancy   ? Essential hypertension, benign as above   ? Generalized headaches 1996-2010    probably hormonally related   ? Heart murmur at birth    Dr. Maisie Fus said he didn't hear it a few years ago   ? Infection    ? Joint pain 1994   ? Other malignant neoplasm without specification of site thyroid, 1980    thyroidectomy   ? Spinal stenosis 2017 to present    per MRIs   ? Thyroid disorder as noted   ? Torn meniscus 12/25/2020   ? Ulcer 1987    with divorce; resolved   ? Unspecified deficiency anemia 2009    from excessive bleeding post-partum; treated iron     Surgical History:   Procedure Laterality Date   ? HX WRIST FRACTURE SURGERY  1993    Baker's thumb with fixation   ? ARTHROPLASTY  2001    L ACL   ? COLONOSCOPY  2017    normal   ? ARTHROSCOPY KNEE WITH PARTIAL LATERAL MENISCECTOMY AND LEFT KNEE INJECTION. Right 01/08/2021    Performed by Vopat, Lowry Ram, MD at [facility] OR   ? BILATERAL TOTAL MASTECTOMIES Bilateral 01/22/2021    Performed by Massie Kluver, MD at [facility] OR   ? INTRAOPERATIVE SENTINEL LYMPH NODE IDENTIFICATION WITH/ WITHOUT NON-RADIOACTIVE DYE INJECTION Left 01/22/2021    Performed by Massie Kluver, MD at [facility] OR   ? INJECTION RADIOACTIVE TRACER FOR SENTINEL NODE IDENTIFICATION Left 01/22/2021    Performed by Massie Kluver, MD at [facility] OR   ? LEFT AXILLARY SENTINEL LYMPH NODE BIOPSY Left 01/22/2021    Performed by Massie Kluver, MD at [facility]   ? BILATERAL CHEST FLAT CLOSURE Bilateral 01/22/2021    Performed by Erlene Quan, Zachary George, MD at [facility] OR   ? BILATERAL CHEST FLAT CLOSURE x 8 Bilateral 01/22/2021    Performed by Stevenson Clinch, MD at [facility] OR   ? EXCISION BENIGN LESION 0.5 CM OR LESS - TORSO Right 01/22/2021    Performed by Erlene Quan, Zachary George, MD at [facility] OR   ? TUNNELED VENOUS PORT PLACEMENT Right 02/19/2021   ? Placement of port-a-cath - 57F Right 02/19/2021    Performed by Freund, Alecia Lemming., MD at [facility] OR   ? FLUOROSCOPIC GUIDANCE CENTRAL VENOUS ACCESS DEVICE PLACEMENT/ REPLACEMENT/ REMOVAL N/A 02/19/2021    Performed by Flo Shanks, Alecia Lemming., MD at [facility] OR   ? Left Completion Axillary Lymph Node Dissection Left 05/14/2021    Performed by Massie Kluver, MD at [facility] OR   ? REMOVAL TUNNELED CENTRAL VENOUS ACCESS DEVICE INCLUDING PORT/ PUMP Right 05/14/2021    Performed by Leveda Anna,  Rosalita Chessman, MD at [facility] OR   ? KNEE SURGERY  L ACL as above   ? THYROIDECTOMY       Family History   Problem Relation Age of Onset   ? Arthritis Paternal Grandmother         (osteoarthritis) multiple heberdens noduls and deformities   ? Back pain Paternal Grandmother    ? Arthritis Mother         wear and tear   ? Back pain Mother    ? Hypertension Mother    ? Joint Pain Mother    ? Neck Pain Mother    ? Cancer-Breast Mother 28        was on HRT for 10-15 years   ? Basal Cell Carcinoma Brother    ? Back pain Brother    ? Hypertension Brother    ? Basal Cell Carcinoma Brother    ? Back pain Brother         has had surgery   ? Hypertension Brother    ? Basal Cell Carcinoma Brother    ? Back pain Brother    ? Hypertension Brother    ? Diabetes Father         Type 2   ? Heart problem Father         CABG 3   ? Heart Disease Father    ? Hypertension Father    ? Diabetes Maternal Grandfather         Type 2   ? Birth Defect Daughter         PRS, congenital diaphragmatic hernia, malrotation, grey matter heterotopia   ? Stroke Maternal Uncle    ? Thyroid Disease Maternal Uncle    ? Diabetes Paternal Grandfather    ? Birth Defect Nephew         craniosynostosis   ? Cancer-Breast Maternal Great-Aunt 78     Social History     Socioeconomic History   ? Marital status: Married   Occupational History   ? Occupation: retired Engineer, civil (consulting)   Tobacco Use   ? Smoking status: Never     Passive exposure: Past   ? Smokeless tobacco: Never   Vaping Use   ? Vaping Use: Never used   Substance and Sexual Activity   ? Alcohol use: Never     Comment: one per month beer   ? Drug use: Never     Comment: in late teens, early twenties, a few times and not since   ? Sexual activity: Yes     Partners: Male     Birth control/protection: Post-menopausal, None     Vaping/E-liquid Use   ? Vaping Use Never User                      Review of Systems   Constitutional: Negative.  Negative for chills, fever and unexpected weight change.   HENT: Negative.    Eyes: Negative.    Respiratory: Negative.    Cardiovascular: Negative.    Gastrointestinal: Negative.  Negative for abdominal distention, abdominal pain, anal bleeding, blood in stool, constipation, diarrhea, nausea, rectal pain and vomiting.   Endocrine: Negative.    Genitourinary: Negative.    Musculoskeletal: Positive for arthralgias, back pain and neck pain.   Skin: Negative.  Negative for color change and wound.   Allergic/Immunologic: Negative.    Neurological: Negative.  Negative for weakness and light-headedness.   Hematological: Negative.    Psychiatric/Behavioral: Negative.  Objective:         ? acetaminophen (TYLENOL) 325 mg tablet Take two tablets by mouth every 6 hours. Take scheduled for 3 days after surgery, then as needed. Do not exceed 4,000mg  in a 24 hour period.   ? ascorbic acid (VITAMIN C PO) Take  by mouth.   ? CALCIUM PO Take  by mouth.   ? cholecalciferol (vitamin D3) (VITAMIN D3 PO) Take  by mouth.   ? DIPH/LIDO/ANTACID 1:1:1 ORAL SUSPENSION (COMPOUND) Swish and Swallow 10 mL by mouth as directed as Needed for Mouth Pain. As needed 3-4 times a day   ? letrozole [facility]) 2.5 mg tablet Take one tablet by mouth daily. (Patient taking differently: Take one tablet by mouth daily. will start after surgery)   ? levothyroxine (SYNTHROID) 112 mcg tablet Take one tablet by mouth daily 30 minutes before breakfast.   ? lisinopriL (ZESTRIL) 2.5 mg tablet Take one tablet by mouth twice daily.   ? mupirocin (CENTANY) 2 % topical ointment Apply  topically to affected area twice daily.   ? nifedipine 0.3%/lidocaine 1.5%/white petrolatum(#) Apply a pea sized amount to the anal opening two to four times a day. Do NOT insert into anus.   ? silver sulfADIAZINE (SSD) 1 % topical cream Apply to affected area up to 3-4 times a day as needed.   ? traMADoL (ULTRAM) 50 mg tablet Take one tablet by mouth at bedtime as needed for Pain.   ? vitamins, B complex tab Take one tablet by mouth three times weekly.   ? vitamins, multiple cap Take one capsule by mouth daily.     Vitals:    08/05/21 0832   BP: 113/74   BP Source: Arm, Left Upper   Pulse: 87   Temp: 36.8 ?C (98.2 ?F)   Resp: 16   SpO2: 100%   O2 Device: None (Room air)   TempSrc: Temporal   PainSc: Three   Weight: 64.6 kg (142 lb 6.4 oz)     Body mass index is 25.63 kg/m?Marland Kitchen     Pain Score: Three  Pain Loc: Back (Neck)    Fatigue Scale: 3       Physical Exam  Vitals reviewed.   Constitutional:       General: She is not in acute distress.     Appearance: Normal appearance. She is well-developed.   HENT:      Head: Normocephalic and atraumatic.      Nose: Nose normal.   Eyes:      General: Lids are normal.      Conjunctiva/sclera: Conjunctivae normal.   Pulmonary:      Effort: Pulmonary effort is normal. No respiratory distress.   Genitourinary:     Comments: External: Healing anterior anal fissure.  Musculoskeletal:         General: Normal range of motion.      Cervical back: Normal range of motion.   Neurological:      Mental Status: She is alert and oriented to person, place, and time.   Psychiatric:         Speech: Speech normal.         Behavior: Behavior normal.         Thought Content: Thought content normal.         Judgment: Judgment normal.               Assessment and Plan:  61yo female with anal fissure, improving with topical nifedipine/lidocaine.  Anal Fissure:  - She has been more religious about staying on stool softeners and applying nifedipine. There is improvement on exam.   - Offered to proceed with Botox or continue on nifedipine cream.   - She elects to proceed with topical cream.     Plan:  - RTC 6-8wks.     Total Time Today was 15 minutes in the following activities: Preparing to see the patient, Performing a medically appropriate examination and/or evaluation and Documenting clinical information in the electronic or other health record    .The patient and family were allowed to ask questions and voice concerns; these were addressed to the best of our ability. They expressed understanding of what was explained to them, and they agreed with the present plan. Patient has the phone numbers for the Cancer Center and was instructed on how to contact us with any questions or concerns.    My collaborating provider on this patient is Dr. Deedra Ehrich MD.    Brunilda Payor. Fredricka Bonine MSN, APRN, AGCNS-BC, AGPCNP-BC  Advanced Practice Provider  Colon and Rectal Surgery  Surgical Oncology  APP for Dr. Kathee Delton, Dr. Daphine Deutscher and Dr. Daiva Nakayama  Pager: [phone]  Available via Amie Critchley and [employer] Connect

## 2021-08-07 ENCOUNTER — Encounter: Admit: 2021-08-07 | Discharge: 2021-08-07 | Payer: MEDICARE

## 2021-08-10 ENCOUNTER — Ambulatory Visit: Admit: 2021-08-10 | Discharge: 2021-08-11 | Payer: MEDICARE

## 2021-08-10 ENCOUNTER — Encounter: Admit: 2021-08-10 | Discharge: 2021-08-10 | Payer: MEDICARE

## 2021-08-10 VITALS — BP 142/90 | HR 85 | Temp 97.90000°F | Resp 16 | Ht 63.0 in | Wt 144.2 lb

## 2021-08-10 DIAGNOSIS — I10 Essential (primary) hypertension: Secondary | ICD-10-CM

## 2021-08-10 DIAGNOSIS — M5134 Other intervertebral disc degeneration, thoracic region: Secondary | ICD-10-CM

## 2021-08-10 DIAGNOSIS — M549 Dorsalgia, unspecified: Secondary | ICD-10-CM

## 2021-08-10 DIAGNOSIS — B999 Unspecified infectious disease: Secondary | ICD-10-CM

## 2021-08-10 DIAGNOSIS — F419 Anxiety disorder, unspecified: Secondary | ICD-10-CM

## 2021-08-10 DIAGNOSIS — IMO0002 Ulcer: Secondary | ICD-10-CM

## 2021-08-10 DIAGNOSIS — E039 Hypothyroidism, unspecified: Secondary | ICD-10-CM

## 2021-08-10 DIAGNOSIS — F32A Depression: Secondary | ICD-10-CM

## 2021-08-10 DIAGNOSIS — M4800 Spinal stenosis, site unspecified: Secondary | ICD-10-CM

## 2021-08-10 DIAGNOSIS — M2550 Pain in unspecified joint: Secondary | ICD-10-CM

## 2021-08-10 DIAGNOSIS — E079 Disorder of thyroid, unspecified: Secondary | ICD-10-CM

## 2021-08-10 DIAGNOSIS — C801 Malignant (primary) neoplasm, unspecified: Secondary | ICD-10-CM

## 2021-08-10 DIAGNOSIS — M5136 Other intervertebral disc degeneration, lumbar region: Secondary | ICD-10-CM

## 2021-08-10 DIAGNOSIS — C73 Malignant neoplasm of thyroid gland: Secondary | ICD-10-CM

## 2021-08-10 DIAGNOSIS — R011 Cardiac murmur, unspecified: Secondary | ICD-10-CM

## 2021-08-10 DIAGNOSIS — R519 Generalized headaches: Secondary | ICD-10-CM

## 2021-08-10 DIAGNOSIS — S83209A Unspecified tear of unspecified meniscus, current injury, unspecified knee, initial encounter: Secondary | ICD-10-CM

## 2021-08-10 DIAGNOSIS — D539 Nutritional anemia, unspecified: Secondary | ICD-10-CM

## 2021-08-10 DIAGNOSIS — C50919 Malignant neoplasm of unspecified site of unspecified female breast: Secondary | ICD-10-CM

## 2021-08-10 DIAGNOSIS — M5030 Other cervical disc degeneration, unspecified cervical region: Secondary | ICD-10-CM

## 2021-08-10 MED ORDER — SYNTHROID 125 MCG PO TAB
125 ug | ORAL_TABLET | Freq: Every day | ORAL | 4 refills | 30.00000 days | Status: AC
Start: 2021-08-10 — End: ?

## 2021-08-10 NOTE — Patient Instructions
It is nice to meet you today Cindy Randolph  Continue the Synthroid 125 mcg/d  Please have labs checked in about a month, ordered for 09/14/21, though it doesn't have to be this date

## 2021-08-11 ENCOUNTER — Encounter: Admit: 2021-08-11 | Discharge: 2021-08-11 | Payer: MEDICARE

## 2021-08-11 DIAGNOSIS — M810 Age-related osteoporosis without current pathological fracture: Secondary | ICD-10-CM

## 2021-08-11 DIAGNOSIS — E890 Postprocedural hypothyroidism: Principal | ICD-10-CM

## 2021-08-12 ENCOUNTER — Encounter: Admit: 2021-08-12 | Discharge: 2021-08-12 | Payer: MEDICARE

## 2021-08-12 ENCOUNTER — Ambulatory Visit: Admit: 2021-08-12 | Discharge: 2021-08-12 | Payer: MEDICARE

## 2021-08-12 DIAGNOSIS — C801 Malignant (primary) neoplasm, unspecified: Secondary | ICD-10-CM

## 2021-08-12 DIAGNOSIS — C73 Malignant neoplasm of thyroid gland: Secondary | ICD-10-CM

## 2021-08-12 DIAGNOSIS — B999 Unspecified infectious disease: Secondary | ICD-10-CM

## 2021-08-12 DIAGNOSIS — L598 Other specified disorders of the skin and subcutaneous tissue related to radiation: Secondary | ICD-10-CM

## 2021-08-12 DIAGNOSIS — C50919 Malignant neoplasm of unspecified site of unspecified female breast: Secondary | ICD-10-CM

## 2021-08-12 DIAGNOSIS — M4800 Spinal stenosis, site unspecified: Secondary | ICD-10-CM

## 2021-08-12 DIAGNOSIS — M549 Dorsalgia, unspecified: Secondary | ICD-10-CM

## 2021-08-12 DIAGNOSIS — F419 Anxiety disorder, unspecified: Secondary | ICD-10-CM

## 2021-08-12 DIAGNOSIS — R011 Cardiac murmur, unspecified: Secondary | ICD-10-CM

## 2021-08-12 DIAGNOSIS — M5134 Other intervertebral disc degeneration, thoracic region: Secondary | ICD-10-CM

## 2021-08-12 DIAGNOSIS — E039 Hypothyroidism, unspecified: Secondary | ICD-10-CM

## 2021-08-12 DIAGNOSIS — R519 Generalized headaches: Secondary | ICD-10-CM

## 2021-08-12 DIAGNOSIS — S83209A Unspecified tear of unspecified meniscus, current injury, unspecified knee, initial encounter: Secondary | ICD-10-CM

## 2021-08-12 DIAGNOSIS — IMO0002 Ulcer: Secondary | ICD-10-CM

## 2021-08-12 DIAGNOSIS — M5136 Other intervertebral disc degeneration, lumbar region: Secondary | ICD-10-CM

## 2021-08-12 DIAGNOSIS — D539 Nutritional anemia, unspecified: Secondary | ICD-10-CM

## 2021-08-12 DIAGNOSIS — E079 Disorder of thyroid, unspecified: Secondary | ICD-10-CM

## 2021-08-12 DIAGNOSIS — M5030 Other cervical disc degeneration, unspecified cervical region: Secondary | ICD-10-CM

## 2021-08-12 DIAGNOSIS — M2550 Pain in unspecified joint: Secondary | ICD-10-CM

## 2021-08-12 DIAGNOSIS — F32A Depression: Secondary | ICD-10-CM

## 2021-08-12 DIAGNOSIS — I10 Essential (primary) hypertension: Secondary | ICD-10-CM

## 2021-08-14 ENCOUNTER — Encounter: Admit: 2021-08-14 | Discharge: 2021-08-14 | Payer: MEDICARE

## 2021-08-18 ENCOUNTER — Encounter: Admit: 2021-08-18 | Discharge: 2021-08-18 | Payer: MEDICARE

## 2021-08-26 ENCOUNTER — Ambulatory Visit: Admit: 2021-08-26 | Discharge: 2021-08-27 | Payer: MEDICARE

## 2021-08-26 ENCOUNTER — Encounter: Admit: 2021-08-26 | Discharge: 2021-08-26 | Payer: MEDICARE

## 2021-08-26 DIAGNOSIS — C50112 Malignant neoplasm of central portion of left female breast: Secondary | ICD-10-CM

## 2021-08-26 DIAGNOSIS — M4800 Spinal stenosis, site unspecified: Secondary | ICD-10-CM

## 2021-08-26 DIAGNOSIS — E079 Disorder of thyroid, unspecified: Secondary | ICD-10-CM

## 2021-08-26 DIAGNOSIS — D539 Nutritional anemia, unspecified: Secondary | ICD-10-CM

## 2021-08-26 DIAGNOSIS — F419 Anxiety disorder, unspecified: Secondary | ICD-10-CM

## 2021-08-26 DIAGNOSIS — M5134 Other intervertebral disc degeneration, thoracic region: Secondary | ICD-10-CM

## 2021-08-26 DIAGNOSIS — Z923 Personal history of irradiation: Secondary | ICD-10-CM

## 2021-08-26 DIAGNOSIS — B999 Unspecified infectious disease: Secondary | ICD-10-CM

## 2021-08-26 DIAGNOSIS — C73 Malignant neoplasm of thyroid gland: Secondary | ICD-10-CM

## 2021-08-26 DIAGNOSIS — C50919 Malignant neoplasm of unspecified site of unspecified female breast: Secondary | ICD-10-CM

## 2021-08-26 DIAGNOSIS — S21102A Unspecified open wound of left front wall of thorax without penetration into thoracic cavity, initial encounter: Secondary | ICD-10-CM

## 2021-08-26 DIAGNOSIS — R011 Cardiac murmur, unspecified: Secondary | ICD-10-CM

## 2021-08-26 DIAGNOSIS — IMO0002 Ulcer: Secondary | ICD-10-CM

## 2021-08-26 DIAGNOSIS — M5136 Other intervertebral disc degeneration, lumbar region: Secondary | ICD-10-CM

## 2021-08-26 DIAGNOSIS — C801 Malignant (primary) neoplasm, unspecified: Secondary | ICD-10-CM

## 2021-08-26 DIAGNOSIS — M549 Dorsalgia, unspecified: Secondary | ICD-10-CM

## 2021-08-26 DIAGNOSIS — S83209A Unspecified tear of unspecified meniscus, current injury, unspecified knee, initial encounter: Secondary | ICD-10-CM

## 2021-08-26 DIAGNOSIS — M2550 Pain in unspecified joint: Secondary | ICD-10-CM

## 2021-08-26 DIAGNOSIS — I10 Essential (primary) hypertension: Secondary | ICD-10-CM

## 2021-08-26 DIAGNOSIS — M5030 Other cervical disc degeneration, unspecified cervical region: Secondary | ICD-10-CM

## 2021-08-26 DIAGNOSIS — F32A Depression: Secondary | ICD-10-CM

## 2021-08-26 DIAGNOSIS — E039 Hypothyroidism, unspecified: Secondary | ICD-10-CM

## 2021-08-26 DIAGNOSIS — R519 Generalized headaches: Secondary | ICD-10-CM

## 2021-08-26 NOTE — Progress Notes
Subjective:       History of Present Illness  Cindy Randolph is a 61 y.o. female.    09.01.22 s/p bilateral total mastectomy with closure (length of closure is 23 cm on the right and 22 cm on the left, total length of closure is 45 cm)    Here today for follow up  Had been being treated by Rosalita Chessman in Wound clinic for left breast non-healing wound. The wound is now healed. She completed chemo and radiation mid February 2023  Is currently taking Letrozole  She is interested in possible liposuction to chest wall, but not for awhile as she would like to adjust to the medications she is currently taking  She is potentially interested in upper bleph in the future as well             Review of Systems   Constitutional: Negative.    HENT: Negative.    Eyes: Negative.    Respiratory: Negative.    Cardiovascular: Negative.    Gastrointestinal: Negative.    Endocrine: Negative.    Genitourinary: Negative.    Musculoskeletal: Negative.    Skin: Negative.    Allergic/Immunologic: Negative.    Neurological: Negative.    Hematological: Negative.    Psychiatric/Behavioral: Negative.    All other systems reviewed and are negative.        Objective:         ? acetaminophen (TYLENOL) 325 mg tablet Take two tablets by mouth every 6 hours. Take scheduled for 3 days after surgery, then as needed. Do not exceed 4,000mg  in a 24 hour period.   ? ascorbic acid (VITAMIN C PO) Take  by mouth.   ? CALCIUM PO Take  by mouth.   ? cholecalciferol (vitamin D3) (VITAMIN D3 PO) Take  by mouth.   ? letrozole (FEMARA) 2.5 mg tablet Take one tablet by mouth daily.   ? lisinopriL (ZESTRIL) 2.5 mg tablet Take one tablet by mouth twice daily.   ? mupirocin (CENTANY) 2 % topical ointment Apply  topically to affected area twice daily.   ? nifedipine 0.3%/lidocaine 1.5%/white petrolatum(#) Apply a pea sized amount to the anal opening two to four times a day. Do NOT insert into anus.   ? SYNTHROID 125 mcg tablet Take one tablet by mouth daily 30 minutes before breakfast. (Patient taking differently: Take 112 mcg by mouth daily 30 minutes before breakfast.)   ? traMADoL (ULTRAM) 50 mg tablet Take one tablet by mouth at bedtime as needed for Pain.   ? vitamins, B complex tab Take one tablet by mouth three times weekly.   ? vitamins, multiple cap Take one capsule by mouth daily.     Vitals:    08/26/21 1301   BP: 118/79   BP Source: Arm, Right Upper   Pulse: 90   Temp: 36.9 ?C (98.4 ?F)   TempSrc: Skin   PainSc: Four   Weight: 66.3 kg (146 lb 1.6 oz)   Height: 160 cm (5' 3)     Body mass index is 25.88 kg/m?Marland Kitchen     Physical Exam  Vitals reviewed.   Pulmonary:      Effort: Pulmonary effort is normal.   Chest:      Comments: Bilateral mastectomy with no reconstruction. Incisions clean dry and intact. No s/s infection or fluid collections. Previous left chest wound healed, with depression of the skin where wound healed        Skin:     General: Skin is  warm and dry.   Neurological:      Mental Status: She is alert and oriented to person, place, and time.              Assessment and Plan:  09.01.22 s/p bilateral total mastectomy (Dr.Balanoff) with closure (length of closure is 23 cm on the right and 22 cm on the left, total length of closure is 45 cm)    Here today for follow up. Her left chest wound is now healed. She is interested in possible liposuction to bilateral chest wall, but has recently started Letrozole and would like to wait about a year or so to adjust to her medications before pursuing possible surgery. Discussed we are happy to see her back at any point to further discuss fat grafting surgery    She also mentioned possible interest in an upper bleph. Dr.DeSouza did discuss this with her. Discussed for insurance to cover surgery, would need to have visual fields test performed by eye doctor to see if her vision is affected. If test is normal, then would consider this cosmetic procedure and could provide her a quote of the cost for in-office vs OR upper bleph.    [name] will reach out to our office if she decides to pursue further surgery    Photos today      Total Time Today was 20 minutes in the following activities: Preparing to see the patient, Obtaining and/or reviewing separately obtained history, Performing a medically appropriate examination and/or evaluation, Counseling and educating the patient/family/caregiver, Documenting clinical information in the electronic or other health record and Care coordination (not separately reported)  Cyndi Bender, APRN-NP

## 2021-08-26 NOTE — Progress Notes
Subjective:       History of Present Illness  Cindy Randolph is a 61 y.o. female.       Review of Systems   Constitutional: Positive for fatigue.   HENT: Negative.    Eyes: Negative.    Respiratory: Negative.    Cardiovascular: Negative.    Gastrointestinal: Negative.    Endocrine: Negative.    Genitourinary: Negative.    Musculoskeletal: Positive for arthralgias and myalgias.   Skin: Negative.    Allergic/Immunologic: Negative.    Neurological: Positive for light-headedness.   Hematological: Negative.    Psychiatric/Behavioral: Negative.          Objective:          acetaminophen (TYLENOL) 325 mg tablet Take two tablets by mouth every 6 hours. Take scheduled for 3 days after surgery, then as needed. Do not exceed 4,'000mg'$  in a 24 hour period.    ascorbic acid (VITAMIN C PO) Take  by mouth.    CALCIUM PO Take  by mouth.    cholecalciferol (vitamin D3) (VITAMIN D3 PO) Take  by mouth.    letrozole [facility]) 2.5 mg tablet Take one tablet by mouth daily.    lisinopriL (ZESTRIL) 2.5 mg tablet Take one tablet by mouth twice daily.    mupirocin (CENTANY) 2 % topical ointment Apply  topically to affected area twice daily.    nifedipine 0.3%/lidocaine 1.5%/white petrolatum(#) Apply a pea sized amount to the anal opening two to four times a day. Do NOT insert into anus.    SYNTHROID 125 mcg tablet Take one tablet by mouth daily 30 minutes before breakfast. (Patient taking differently: Take 112 mcg by mouth daily 30 minutes before breakfast.)    traMADoL (ULTRAM) 50 mg tablet Take one tablet by mouth at bedtime as needed for Pain.    vitamins, B complex tab Take one tablet by mouth three times weekly.    vitamins, multiple cap Take one capsule by mouth daily.     Vitals:    08/26/21 1301   PainSc: Four     There is no height or weight on file to calculate BMI.     Physical Exam         Assessment and Plan:

## 2021-08-28 ENCOUNTER — Encounter: Admit: 2021-08-28 | Discharge: 2021-08-28 | Payer: MEDICARE

## 2021-08-30 ENCOUNTER — Encounter: Admit: 2021-08-30 | Discharge: 2021-08-30 | Payer: MEDICARE

## 2021-09-04 ENCOUNTER — Encounter: Admit: 2021-09-04 | Discharge: 2021-09-04 | Payer: MEDICARE

## 2021-09-06 ENCOUNTER — Encounter: Admit: 2021-09-06 | Discharge: 2021-09-06 | Payer: MEDICARE

## 2021-09-07 ENCOUNTER — Encounter: Admit: 2021-09-07 | Discharge: 2021-09-07 | Payer: MEDICARE

## 2021-09-07 DIAGNOSIS — C50112 Malignant neoplasm of central portion of left female breast: Secondary | ICD-10-CM

## 2021-09-07 MED ORDER — VERZENIO 100 MG PO TAB
100 mg | ORAL_TABLET | Freq: Two times a day (BID) | ORAL | 0 refills | Status: AC
Start: 2021-09-07 — End: ?
  Filled 2021-09-15: qty 56, 28d supply, fill #1

## 2021-09-14 ENCOUNTER — Encounter: Admit: 2021-09-14 | Discharge: 2021-09-14 | Payer: MEDICARE

## 2021-09-14 DIAGNOSIS — M5030 Other cervical disc degeneration, unspecified cervical region: Secondary | ICD-10-CM

## 2021-09-14 DIAGNOSIS — C50112 Malignant neoplasm of central portion of left female breast: Secondary | ICD-10-CM

## 2021-09-14 DIAGNOSIS — E039 Hypothyroidism, unspecified: Secondary | ICD-10-CM

## 2021-09-14 DIAGNOSIS — E890 Postprocedural hypothyroidism: Secondary | ICD-10-CM

## 2021-09-14 DIAGNOSIS — M5136 Other intervertebral disc degeneration, lumbar region: Secondary | ICD-10-CM

## 2021-09-14 DIAGNOSIS — M549 Dorsalgia, unspecified: Secondary | ICD-10-CM

## 2021-09-14 DIAGNOSIS — B999 Unspecified infectious disease: Secondary | ICD-10-CM

## 2021-09-14 DIAGNOSIS — IMO0002 Ulcer: Secondary | ICD-10-CM

## 2021-09-14 DIAGNOSIS — E079 Disorder of thyroid, unspecified: Secondary | ICD-10-CM

## 2021-09-14 DIAGNOSIS — C73 Malignant neoplasm of thyroid gland: Secondary | ICD-10-CM

## 2021-09-14 DIAGNOSIS — M2550 Pain in unspecified joint: Secondary | ICD-10-CM

## 2021-09-14 DIAGNOSIS — Z79811 Long term (current) use of aromatase inhibitors: Secondary | ICD-10-CM

## 2021-09-14 DIAGNOSIS — S83209A Unspecified tear of unspecified meniscus, current injury, unspecified knee, initial encounter: Secondary | ICD-10-CM

## 2021-09-14 DIAGNOSIS — C50919 Malignant neoplasm of unspecified site of unspecified female breast: Secondary | ICD-10-CM

## 2021-09-14 DIAGNOSIS — M810 Age-related osteoporosis without current pathological fracture: Secondary | ICD-10-CM

## 2021-09-14 DIAGNOSIS — M4800 Spinal stenosis, site unspecified: Secondary | ICD-10-CM

## 2021-09-14 DIAGNOSIS — R519 Generalized headaches: Secondary | ICD-10-CM

## 2021-09-14 DIAGNOSIS — M5134 Other intervertebral disc degeneration, thoracic region: Secondary | ICD-10-CM

## 2021-09-14 DIAGNOSIS — D539 Nutritional anemia, unspecified: Secondary | ICD-10-CM

## 2021-09-14 DIAGNOSIS — F32A Depression: Secondary | ICD-10-CM

## 2021-09-14 DIAGNOSIS — I10 Essential (primary) hypertension: Secondary | ICD-10-CM

## 2021-09-14 DIAGNOSIS — F419 Anxiety disorder, unspecified: Secondary | ICD-10-CM

## 2021-09-14 DIAGNOSIS — C801 Malignant (primary) neoplasm, unspecified: Secondary | ICD-10-CM

## 2021-09-14 DIAGNOSIS — R011 Cardiac murmur, unspecified: Secondary | ICD-10-CM

## 2021-09-14 DIAGNOSIS — Z9189 Other specified personal risk factors, not elsewhere classified: Secondary | ICD-10-CM

## 2021-09-14 LAB — PARATHYROID HORMONE: PTH HORMONE: 48 pg/mL (ref 10–65)

## 2021-09-14 LAB — THYROID STIMULATING HORMONE-TSH: TSH: 0.3 uU/mL — ABNORMAL LOW (ref 0.35–5.00)

## 2021-09-14 LAB — FREE T4 (FREE THYROXINE) ONLY: FREE T4: 1.4 ng/dL (ref 0.6–1.6)

## 2021-09-14 NOTE — Progress Notes
LYMPHEDEMA DATASHEET-FOLLOW-UP    DATE:  09/14/2021    PATIENT NAME:  Cindy Randolph  DATE OF BIRTH:  [birth year]  MRN:   [id number]    Follow-Up Visit:  4 month   Breast Surgery History: Bilateral total mastectomy/left SLNB (5/5) with oncoplastic flat closure on 01/22/2021, left completion ALND (6/14) on 05/14/2021     Lymphedema Diagnosis: No     Diagnostic Definition: Other:         Risk Factors:  BMI over 30? No  Change in BMI from initial diagnosis? Stable   Recent infections? No  Radiation therapy? Yes, left chest wall + nodes 06/10/21 - 07/01/21  Chemotherapy? Yes, adjuvant ddAC 02/23/21 - 04/06/21, declined Taxol  Use of compression sleeve? Unilateral    Cindy Randolph presents to clinic today for ALND follow-up. Reviewed with patient any general concerns as it relates to lymphedema. None indicated. Reviewed signs and symptoms of early lymphedema in affected arm. She reports occasional transient episodes of aching, heaviness, and/or fullness in her LUE. She has a compression sleeve, but admits she has not worn it. Discussed current activity level. She is able to perform daily activities without difficulty. Reinforced and verified adherence to precautions from initial visit. Reviewed compression sleeve instructions and recommended times of use. Verified patient is aware of the early lymphedema signs and symptoms to watch for as well as when to contact us.      Compression Garment Size:  Sleeve: unknown    Bioimpedance Analysis  Current: 3.8  Baseline: 2.0  Change from Baseline: 1.8  Normal, less than 3 standard deviations increase from baseline.    Hand Dominance:  right handed    Circumferential Measurements   RUE/LUE cm   Hand cm:  19.4 /  18.3    Wrist cm:  16.4 /  16.0     8 cm:  18.8 /  18.9   16 cm:  23.0 /  22.5   Elbow cm:  24.7 /  24.6   8 cm:  29.2 /  28.9   16 cm:  30.7 /  30.5   24 cm:  33.4 /  33.0          Assessment:  Bilateral upper extremity skin is pink, soft and intact. No rash, erythema or ulcerations noted.   No edema noted.  Visualization of vasculature and bony prominences equal bilaterally.   ROM: WNL, able to fully extend bilateral upper extremities above head. Patient reports tightness in left axilla and lateral chest wall when left arm is fully raised.   Scar tissue noted along lateral portion of mastectomy scar     Reviewed assessment with patient. No change in assessment to indicate presence of lymphedema. Discussed and demonstrated self-massage using stretch and pull (stationary circles) technique over area of scar tissue.    Lymphedema Stage:  Not applicable, no indication of lymphedema.     Recommendations:  Wear prophylactic compression sleeve per initial ALND recommended activities for air travel, exercise, strenuous/lifting activities, activities that require repetitive arm movements. Self-massage along lateral portion of mastectomy scar and range of motion stretching exercises daily. Continue with ALND risk-reduction precautions and meticulous skin care. Weight management as well as an active healthy lifestyle are beneficial. Verified no referrals indicated at this time for any issues or concerns.    Plan: Return to clinic for routine follow-up as scheduled on 11/30/21, in coordination with routine surgical follow-up. All questions answered. Patient is agreeable to recommendations and follow-up. Verified patient has  contact information should any questions or concerns arise.

## 2021-09-14 NOTE — Progress Notes
Oral Chemotherapy Counseling  Abemaciclib (Verzenio?)    Cindy Randolph was provided medication education regarding her new oral chemotherapy.     I reviewed the role of [facility], including access to medication assistance specialists if needed.     How to Take the Medication  Cindy Randolph was educated on Abemaciclib [facility]?), the indication for treatment, dose, route, frequency and duration of therapy.     ? Directions:   ? Adjuvant combination therapy:  100 mg by mouth twice daily with or without food in combination with endocrine therapy (aromatase inhibitor or tamoxifen). Starting at a lower dose to see how patient tolerates, with plan to increase to 150 mg by mouth twice daily.  ? Patient was educated to swallow tablets whole and not to crush, chew or cut tablets  ? Patient was educated to avoid grapefruit and grapefruit juice.    Medication Administration Assessment  The patient's ability to self-administer medication was assessed  ? The patient has the ability to self-administer this medication.     Adherence  Patient was educated on the importance of adherence and that the consequences of non-adherence could include disease progression. The patient's ability to be adherent with drug therapies was discussed and the patient was provided options for tools/resources that promote adherence to therapy. For [employer] alarms, calendars, pill box, and technology (reminder apps) were recommended and/or provided.     How to Manage Missed Doses  I instructed the patient that if a dose is missed to skip the dose and resume the usual dosing schedule at the next time a dose is due. If the patient vomits following administration of a dose, no additional doses should be administered that day; the next prescribed dose should be taken at the usual time.    How to Store Medication  Cindy Randolph was educated to store Abemaciclib (Verzenio?) at room temperature in a safe place away from humidity, pets, and children. I instructed the patient that it was okay to store Abemaciclib (Verzenio?) in a pill box, if needed.   I recommended if family members would be handling the medication, they should use gloves.  Additionally, I recommended cleaning any surfaces touched by abemaciclib with bleach, if possible.     Contraindications / Safety Precautions / Adverse Effects  Contraindications to therapy, safety precautions, and common adverse effects (listed below) were discussed with the patient.  I explained that most patients do NOT experience all these side effects and that this list was not inclusive.  I instructed patient to report any adverse effects to their doctor, pharmacist or nurse.     ? Diarrhea (may be severe, most often occurs in the first month)  ? Fatigue  ? Headache  ? Decreased appetite  ? Abdominal pain  ? Nausea  ? Vomiting  ? Anemia  ? Neutropenia   ? Increased liver function tests  ? Infection  ? Increased serum creatinine  ? Interstitial lung disease / pneumonitis (rare, but potentially fatal)    Patient was instructed to call and seek help immediately if she had:   ? Signs of an infection such as fever, chills, cough, pain or burning when you urinate.   ? Signs of bleeding problems such as black tarry stools; blood in urine; pinpoint red or purple spots on skin.   ? Signs of liver problems such as yellow eyes or skin, white or clay-colored stools.     Patient was instructed to call nurse or doctor  within 24 hours if she had:   ? Diarrhea with four stools a day more than usual, or diarrhea during the night.   ? Nausea that causes you to eat a lot less than usual or vomiting more than 2 times in 24 hours.   ? Signs of anemia such as unusual tiredness or weakness.   ? Severe abdominal or stomach cramping or pain.     Medication Reconciliation:  Home Medications    Medication Sig   abemaciclib (VERZENIO) 100 mg tablet Take one tablet by mouth twice daily.   acetaminophen (TYLENOL) 325 mg tablet Take two tablets by mouth every 6 hours. Take scheduled for 3 days after surgery, then as needed. Do not exceed 4,000mg  in a 24 hour period.   ascorbic acid (VITAMIN C PO) Take  by mouth.   CALCIUM PO Take  by mouth.   cholecalciferol (vitamin D3) (VITAMIN D3 PO) Take  by mouth.   letrozole [facility]) 2.5 mg tablet Take one tablet by mouth daily.   lisinopriL (ZESTRIL) 2.5 mg tablet Take one tablet by mouth twice daily.   mupirocin (CENTANY) 2 % topical ointment Apply  topically to affected area twice daily.   nifedipine 0.3%/lidocaine 1.5%/white petrolatum(#) Apply a pea sized amount to the anal opening two to four times a day. Do NOT insert into anus.   SYNTHROID 125 mcg tablet Take one tablet by mouth daily 30 minutes before breakfast.  Patient taking differently: Take 112 mcg by mouth daily 30 minutes before breakfast.   traMADoL (ULTRAM) 50 mg tablet Take one tablet by mouth at bedtime as needed for Pain.   vitamins, B complex tab Take one tablet by mouth three times weekly.   vitamins, multiple cap Take one capsule by mouth daily.       A medication history and reconciliation was performed (including prescription medications, supplements, over the counter medications, and herbal products). The medication list was updated and the patient's current medication list is included below.  I stressed the importance of maintaining an accurate medication list and informing their medical team prior to taking any new medications.     Drug Interaction Assessment  Drug-Drug Interactions (DDIs)  ? DDIs were evaluated and No significant drug-drug interactions were identified.      Drug-Food Interactions  ? Drug-food interactions were evaluated and No significant drug-food interactions were identified.   ? Avoid grapefruit and grapefruit juice while taking?     Vaccination Status Education   Appropriate recommended vaccinations were reviewed and discussed with the patient as indicated. The patient was also reminded about the importance of receiving an annual influenza vaccine as indicated.    REMS Program   No REMS is required for this medication.    Reproductive Concerns  Reproductive concerns were reviewed with the patient. As patient is a female not of child-bearing potential, education was not applicable.     What to Do With Any Unused or Expired Medications  Appropriate safe handling and disposal procedures were reviewed with the patient. Cindy Randolph was instructed to return any unused or expired oral chemotherapy medication to a designated disposal bin at one of the [facility] locations or to utilize a community drug take back program. Instructed not to flush down the toilet or to crush and dispose of medication in the trash.    Monitoring  Monitoring and follow-up plan was discussed with patient. Cindy Randolph was instructed to contact the oral chemotherapy pharmacist at [phone] if they have  any questions or concerns regarding their medication therapy.  Informed the patient that we would send the prescription to a specialty pharmacy and that the pharmacy would be calling the patient to schedule a shipment.  Emphasized if their phone calls were not answered, the specialty pharmacy would not ship the medication.    This medication is considered medium risk per our internal oral chemotherapy risk categorization and the patient will be contacted for education, toxicity check at 2 weeks, one reassessment at 3 months and then annually, if applicable (medium risk monitoring).      Questions  Patient was given the opportunity to ask questions. Patient verbalized understanding, agreed with the plan and had no questions or concerns regarding therapy.     Carolyn Stare, [facility]  Clinical Pharmacist  09/14/21

## 2021-09-14 NOTE — Progress Notes
Initial Assessment: Oral Chemotherapy    Cindy Randolph is a 61 y.o. female with a diagnosis of breast cancer (hormone receptor positive, HER2 negative).     Indication/Regimen      Abemaciclib (Verzenio) is being used appropriately for the adjuvant treatment of breast cancer. The dosing regimen of 100 mg by mouth twice daily is appropriate for Cindy Randolph. It is planned to continue until chemotherapy regimen complete.        Therapeutic Goals    Plan Name OP SUPPORT ZOLEDRONIC ACID 4MG  (EVERY 6 MONTHS)   Treatment Goal Supportive Care Plan   Treatment Plan Start Date 09/07/2021 (Planned)       Plan Name OP BREAST ABEMACICLIB + ENDOCRINE THERAPY (ADJUVANT)   Treatment Goal Curative    Treatment Plan Start Date 09/14/2021 (Planned)            Of note, patient will be starting at a lower dose (100 mg twice daily) to see how she tolerates the side effect profile. The plan is to increase to 150 mg twice daily at that time.     The patient has the ability to self-administer this medication.       Medical History:   Diagnosis Date   ? Acquired hypothyroidism    ? Anxiety 2016    from pain   ? Back pain    ? Breast cancer in female [facility]) 12/2020    Left   ? Cancer of thyroid (HCC) 1980   ? Degenerative disc disease, cervical 1994   ? Degenerative disc disease, lumbar 2010   ? Degenerative disc disease, thoracic 2010   ? Depression situational, transient    after divorce, after death of 2nd husband   ? Essential hypertension began in 3rd trimester most pregnancies    remained after last pregnancy   ? Essential hypertension, benign as above   ? Generalized headaches 1996-2010    probably hormonally related   ? Heart murmur at birth    Dr. Maisie Fus said he didn't hear it a few years ago   ? Infection    ? Joint pain 1994   ? Other malignant neoplasm without specification of site thyroid, 1980    thyroidectomy   ? Spinal stenosis 2017 to present    per MRIs   ? Thyroid disorder as noted   ? Torn meniscus 12/25/2020 ? Ulcer 1987    with divorce; resolved   ? Unspecified deficiency anemia 2009    from excessive bleeding post-partum; treated iron        Patient History     Cancer Diagnosis: Cancer Staging   Malignant neoplasm of left breast in female, estrogen receptor positive (HCC)  Staging form: Breast, AJCC 8th Edition  - Clinical stage from 12/22/2020: Stage IA (cT1c, cN0, cM0, G1, ER+, PR+, HER2-) - Signed by Massie Kluver, MD on 12/22/2020  - Pathologic stage from 02/02/2021: Stage IIIA (pT3, pN3, cM0, G1, ER+, PR+, HER2-) - Signed by Massie Kluver, MD on 05/27/2021     Relevant tumor markers:   Past treatment regimens:   Past Treatment Plans    ONCOLOGY 1   Plan Name Cycles Start Date Discontinue Date Discontinue Reason Discontinue User    OP BREAST PACLITAXEL (DOSE-DENSE) Treatment not started 04/20/2021  04/20/2021 Patient Preference Oletta Cohn, MD    OP BREAST DOXORUBICIN + CYCLOPHOSPHAMIDE (DOSE-DENSE AC) 4 of 4 cycles started 02/23/2021  04/13/2021 Therapy Complete Renette Butters, APRN-NP  Height, Weight, BSA    Estimated body surface area is 1.73 meters squared as calculated from the following:    Height as of 08/26/21: 160 cm (5' 3).    Weight as of this encounter: 67.5 kg (148 lb 12.8 oz).         Allergies   Allergen Reactions   ? Oxycodone NAUSEA ONLY     Prefers tramadol   ? Sudafed [Pseudoephedrine Hcl] PALPITATIONS       Baseline Labs:   CBC w diff    Lab Results   Component Value Date/Time    WBC 5.8 09/14/2021 01:27 PM    RBC 3.86 (L) 09/14/2021 01:27 PM    HGB 12.1 09/14/2021 01:27 PM    HCT 35.6 (L) 09/14/2021 01:27 PM    MCV 92.4 09/14/2021 01:27 PM    MCH 31.4 09/14/2021 01:27 PM    MCHC 34.0 09/14/2021 01:27 PM    RDW 14.2 09/14/2021 01:27 PM    PLTCT 205 09/14/2021 01:27 PM    MPV 8.4 09/14/2021 01:27 PM    Lab Results   Component Value Date/Time    NEUT 69 09/14/2021 01:27 PM    ANC 4.10 09/14/2021 01:27 PM    LYMA 18 (L) 09/14/2021 01:27 PM    ALC 1.00 09/14/2021 01:27 PM MONA 9 09/14/2021 01:27 PM    AMC 0.50 09/14/2021 01:27 PM    EOSA 3 09/14/2021 01:27 PM    AEC 0.20 09/14/2021 01:27 PM    BASA 1 09/14/2021 01:27 PM    ABC 0.00 09/14/2021 01:27 PM        Comprehensive Metabolic Profile    Lab Results   Component Value Date/Time    NA 135 (L) 08/05/2021 09:10 AM    K 4.0 08/05/2021 09:10 AM    CL 99 08/05/2021 09:10 AM    CO2 26 08/05/2021 09:10 AM    GAP 10 08/05/2021 09:10 AM    BUN 17 08/05/2021 09:10 AM    CR 0.82 08/05/2021 09:10 AM    GLU 86 08/05/2021 09:10 AM    Lab Results   Component Value Date/Time    CA 10.0 08/05/2021 09:10 AM    ALBUMIN 4.0 06/29/2021 10:05 AM    TOTPROT 6.9 06/29/2021 10:05 AM    ALKPHOS 52 06/29/2021 10:05 AM    AST 14 06/29/2021 10:05 AM    ALT 10 06/29/2021 10:05 AM    TOTBILI 0.3 06/29/2021 10:05 AM             Creatinine clearance cannot be calculated (Patient's most recent lab result is older than the maximum 30 days allowed.)     Pregnancy status    The patient?s pregnancy status was assessed. As patient is a female not of child-bearing potential, education is not applicable.     Medication Reconciliation    Home Medications    Medication Sig   abemaciclib (VERZENIO) 100 mg tablet Take one tablet by mouth twice daily.   acetaminophen (TYLENOL) 325 mg tablet Take two tablets by mouth every 6 hours. Take scheduled for 3 days after surgery, then as needed. Do not exceed 4,000mg  in a 24 hour period.   ascorbic acid (VITAMIN C PO) Take  by mouth.   CALCIUM PO Take  by mouth.   cholecalciferol (vitamin D3) (VITAMIN D3 PO) Take  by mouth.   letrozole [facility]) 2.5 mg tablet Take one tablet by mouth daily.   lisinopriL (ZESTRIL) 2.5 mg tablet Take one tablet by mouth twice daily.  mupirocin (CENTANY) 2 % topical ointment Apply  topically to affected area twice daily.   nifedipine 0.3%/lidocaine 1.5%/white petrolatum(#) Apply a pea sized amount to the anal opening two to four times a day. Do NOT insert into anus.   SYNTHROID 125 mcg tablet Take one tablet by mouth daily 30 minutes before breakfast.  Patient taking differently: Take 112 mcg by mouth daily 30 minutes before breakfast.   traMADoL (ULTRAM) 50 mg tablet Take one tablet by mouth at bedtime as needed for Pain.   vitamins, B complex tab Take one tablet by mouth three times weekly.   vitamins, multiple cap Take one capsule by mouth daily.       Medication reconciliation is based on the patient?s most recent medication list in the electronic medical record (EMR) including herbal products and OTC medications. The patient's medication list will be updated during patient education, after speaking with the patient and prior to dispensing the medication.     Drug-drug interactions (DDIs)    DDIs were evaluated: No significant drug-drug interactions were identified.     Follow up plan: will discuss with Patient and spouse and determine if alternative therapy is appropriate.      Drug-Food Interactions    Drug-food interactions were evaluated: No significant drug-food interactions were identified.      Contraindications    Contraindications to the use of abemaciclib were reviewed and no contraindications were identified for Ms. Kraska.     Vaccination Status Assessment       There is no immunization history on file for this patient.    Appropriate recommended vaccinations were reviewed.    Vaccine history was reviewed. The patient will be reminded about the importance of receiving an annual influenza vaccine as indicated.      Safety Precautions    Safety precautions for this medication have been reviewed. No concerns have been identified.     Risk Evaluation and Mitigation Strategy (REMS) Assessment    No REMS is required for this medication.    Initial therapy assessment has been completed and Ms. Canete will be contacted to complete education on her regimen.      Carolyn Stare, [facility]  Oncology Clinical Pharmacist  09/14/2021

## 2021-09-14 NOTE — Progress Notes
The Prior Authorization for Cindy Randolph has been submitted for Cindy Randolph via Cover My Meds.  Will continue to follow.    [place] Patient Advocate  [phone]

## 2021-09-14 NOTE — Progress Notes
Name: Cindy Randolph          MRN: [id number]      DOB: [birth year]      AGE: 61 y.o.   DATE OF SERVICE: 09/14/2021    Subjective:             Reason for Visit:  Follow Up      Cindy Randolph is a 61 y.o. female.      Cancer Staging   Malignant neoplasm of left breast in female, estrogen receptor positive (HCC)  Staging form: Breast, AJCC 8th Edition  - Clinical stage from 12/22/2020: Stage IA (cT1c, cN0, cM0, G1, ER+, PR+, HER2-) - Signed by Massie Kluver, MD on 12/22/2020  - Pathologic stage from 02/02/2021: Stage IIIA (pT3, pN3, cM0, G1, ER+, PR+, HER2-) - Signed by Massie Kluver, MD on 05/27/2021      History of Present Illness  Cindy Randolph is a 61 y.o. woman with a recent diagnosis of left hormone positive breast cancer.    She presented for a routine mammogram in July 2022 which showed benign-appearing microcalcifications and an unchanged low-density circumscribed mass in the right upper upper central breast.  There was also a focal asymmetry in the left breast.  She had a diagnostic left mammogram on 12/04/2020 which showed an irregular density at 12:00 in the left breast and on target ultrasound there was an ill-defined area with slightly hypoechoic at 12:00 7 cm from the nipple.  She underwent ultrasound-guided biopsy on 12/10/2020 which showed grade 1 invasive lobular carcinoma ER 91 to 100%, PR 11 to 20%, HER2 1+ (negative by IHC), Ki-67 2 to 5%.  She then had an MRI on 12/26/2020 which showed multiple faint masses in the left breast with non-mass enhancement extending in the upper and inner quadrant.     On 01/22/2021 she underwent bilateral mastectomy with final pathology showing left breast invasive lobular carcinoma, grade 1, multifocal with connections that merged together for a total extent of 7.4 cm, 5 out of 5 lymph nodes were positive for carcinoma.  Markers were ER 96%, PR 10%, HER2 0, Ki-67 3%.  Right breast was benign.    She had staging scans 02/10/2021 which were negative for metastatic disease.  She then received adjuvant chemotherapy with dose dense AC from 02/23/2021 until 04/06/2021.  She was scheduled to start paclitaxel on 04/20/2021 but declined due to concern for neuropathy.     She underwent left axillary lymph node dissection on 05/14/2021 with final pathology showing carcinoma in 6 of 14 lymph nodes with the largest metastatic focus measuring 1 cm.  This is for a total of 11 out of 19 lymph nodes positive for carcinoma.    She completed adjuvant PMRT on 07/01/2021 and then started letrozole. She reports feeling well overall. She is a little more fatigued since starting endocrine therapy, but is starting to get more exercise.              Review of Systems   Constitutional: Positive for fatigue. Negative for activity change, chills, diaphoresis, fever and unexpected weight change.   HENT: Negative for mouth sores, nosebleeds and sinus pressure.    Eyes: Negative for pain and visual disturbance.   Respiratory: Negative for cough, shortness of breath and wheezing.    Cardiovascular: Negative for chest pain and palpitations.   Gastrointestinal: Negative for abdominal distention, abdominal pain, constipation, diarrhea, nausea and vomiting.   Genitourinary: Negative for menstrual problem and vaginal bleeding.  Musculoskeletal: Positive for arthralgias (tolerable). Negative for back pain and myalgias.   Skin: Negative for rash and wound.   Neurological: Negative for dizziness, weakness, light-headedness, numbness and headaches.   Hematological: Negative for adenopathy. Does not bruise/bleed easily.   Psychiatric/Behavioral: Negative for sleep disturbance. The patient is not nervous/anxious.          Objective:         ? abemaciclib (VERZENIO) 100 mg tablet Take one tablet by mouth twice daily.   ? acetaminophen (TYLENOL) 325 mg tablet Take two tablets by mouth every 6 hours. Take scheduled for 3 days after surgery, then as needed. Do not exceed 4,000mg  in a 24 hour period. ? ascorbic acid (VITAMIN C PO) Take  by mouth.   ? CALCIUM PO Take  by mouth.   ? cholecalciferol (vitamin D3) (VITAMIN D3 PO) Take  by mouth.   ? letrozole (FEMARA) 2.5 mg tablet Take one tablet by mouth daily.   ? lisinopriL (ZESTRIL) 2.5 mg tablet Take one tablet by mouth twice daily.   ? mupirocin (CENTANY) 2 % topical ointment Apply  topically to affected area twice daily.   ? nifedipine 0.3%/lidocaine 1.5%/white petrolatum(#) Apply a pea sized amount to the anal opening two to four times a day. Do NOT insert into anus.   ? SYNTHROID 125 mcg tablet Take one tablet by mouth daily 30 minutes before breakfast. (Patient taking differently: Take 112 mcg by mouth daily 30 minutes before breakfast.)   ? traMADoL (ULTRAM) 50 mg tablet Take one tablet by mouth at bedtime as needed for Pain.   ? vitamins, B complex tab Take one tablet by mouth three times weekly.   ? vitamins, multiple cap Take one capsule by mouth daily.     Vitals:    09/14/21 1343   BP: (!) 135/96   BP Source: Arm, Right Upper   Pulse: 80   Temp: 36.7 ?C (98 ?F)   Resp: 18   SpO2: 100%   TempSrc: Temporal   PainSc: Four   Weight: 67.5 kg (148 lb 12.8 oz)     Body mass index is 26.36 kg/m?Marland Kitchen     Pain Score: Four  Pain Loc:  (Generalized Pain)    Fatigue Scale: 4    Pain Addressed:  Current regimen working to control pain. Torn meniscus s/p repair    Patient Evaluated for a Clinical Trial: No treatment clinical trial available for this patient.     [place] Cooperative Oncology Group performance status is 0, Fully active, able to carry on all pre-disease performance without restriction.        Physical Exam  Constitutional:       Appearance: She is well-developed.   HENT:      Head: Normocephalic and atraumatic.   Eyes:      General: No scleral icterus.     Conjunctiva/sclera: Conjunctivae normal.   Cardiovascular:      Rate and Rhythm: Normal rate.   Pulmonary:      Effort: Pulmonary effort is normal. No respiratory distress.      Breath sounds: Normal breath sounds. No wheezing.   Chest:   Breasts:     Right: No inverted nipple, mass, nipple discharge, skin change or tenderness.      Left: Skin change present. No inverted nipple, mass, nipple discharge or tenderness.          Comments: Breast exam deferred  Abdominal:      Tenderness: There is no abdominal tenderness.  Musculoskeletal:         General: No tenderness.      Cervical back: Normal range of motion and neck supple.   Lymphadenopathy:      Upper Body:      Right upper body: No supraclavicular or axillary adenopathy.      Left upper body: No supraclavicular or axillary adenopathy.   Skin:     General: Skin is warm and dry.      Findings: No rash.   Neurological:      Mental Status: She is alert and oriented to person, place, and time.      Cranial Nerves: No cranial nerve deficit.      Sensory: No sensory deficit.      Motor: No weakness.   Psychiatric:         Behavior: Behavior normal.         Thought Content: Thought content normal.            CBC w diff    Lab Results   Component Value Date/Time    WBC 5.8 09/14/2021 01:27 PM    RBC 3.86 (L) 09/14/2021 01:27 PM    HGB 12.1 09/14/2021 01:27 PM    HCT 35.6 (L) 09/14/2021 01:27 PM    MCV 92.4 09/14/2021 01:27 PM    MCH 31.4 09/14/2021 01:27 PM    MCHC 34.0 09/14/2021 01:27 PM    RDW 14.2 09/14/2021 01:27 PM    PLTCT 205 09/14/2021 01:27 PM    MPV 8.4 09/14/2021 01:27 PM    Lab Results   Component Value Date/Time    NEUT 69 09/14/2021 01:27 PM    ANC 4.10 09/14/2021 01:27 PM    LYMA 18 (L) 09/14/2021 01:27 PM    ALC 1.00 09/14/2021 01:27 PM    MONA 9 09/14/2021 01:27 PM    AMC 0.50 09/14/2021 01:27 PM    EOSA 3 09/14/2021 01:27 PM    AEC 0.20 09/14/2021 01:27 PM    BASA 1 09/14/2021 01:27 PM    ABC 0.00 09/14/2021 01:27 PM        Comprehensive Metabolic Profile    Lab Results   Component Value Date/Time    NA 136 (L) 09/14/2021 01:27 PM    K 4.0 09/14/2021 01:27 PM    CL 100 09/14/2021 01:27 PM    CO2 28 09/14/2021 01:27 PM    GAP 8 09/14/2021 01:27 PM BUN 14 09/14/2021 01:27 PM    CR 0.73 09/14/2021 01:27 PM    GLU 130 (H) 09/14/2021 01:27 PM    Lab Results   Component Value Date/Time    CA 9.6 09/14/2021 01:27 PM    PO4 3.8 09/14/2021 01:27 PM    ALBUMIN 4.5 09/14/2021 01:27 PM    TOTPROT 7.1 09/14/2021 01:27 PM    ALKPHOS 60 09/14/2021 01:27 PM    AST 19 09/14/2021 01:27 PM    ALT 18 09/14/2021 01:27 PM    TOTBILI 0.4 09/14/2021 01:27 PM                 Assessment and Plan:  Cindy Randolph is a 61 y.o. postmenopausal woman with a history of Stage IIIA (pT3pN3cN0) hormone positive invasive lobular carcinoma.     She completed 4 cycles of adjuvant AC, declined Taxol. She completed radiation 07/01/21.    Long term plan includes at least 5 years of endocrine therapy. She started letrozole 06/2021 and is tolerating it well. In addition recommend adjuvant abemaciclib x2 years.  Reviewed side effects including fatigue, diarrhea, neutropenia, etc. Plan to start at 100mg  BID dose and if well tolerated increase to standard 150mg  BID dosing. She has kids graduation and is moving from [place] to [facility] so she is going to start abema the end of May.  Consent signed.     Will need annual bone density while on an aromatase inhibitor. Also recommend getting 2-3 servings of calcium in diet and taking 2000IU vitamin D daily. Bone density on 04/22/21 with osteoporosis. Recommend adjuvant IV bisphosphonate (zolendonric acid) while on AI in setting of osteoporosis. Will start at return visit.     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    RTC in 2 months with lab, cycle 2 and zometa     Francie Massing, APRN-NP    Joni Reining collaborating MD

## 2021-09-15 ENCOUNTER — Ambulatory Visit: Admit: 2021-09-15 | Discharge: 2021-09-16 | Payer: MEDICARE

## 2021-09-15 ENCOUNTER — Encounter: Admit: 2021-09-15 | Discharge: 2021-09-15 | Payer: MEDICARE

## 2021-09-15 DIAGNOSIS — E079 Disorder of thyroid, unspecified: Secondary | ICD-10-CM

## 2021-09-15 DIAGNOSIS — M5136 Other intervertebral disc degeneration, lumbar region: Secondary | ICD-10-CM

## 2021-09-15 DIAGNOSIS — C73 Malignant neoplasm of thyroid gland: Secondary | ICD-10-CM

## 2021-09-15 DIAGNOSIS — M4800 Spinal stenosis, site unspecified: Secondary | ICD-10-CM

## 2021-09-15 DIAGNOSIS — E039 Hypothyroidism, unspecified: Secondary | ICD-10-CM

## 2021-09-15 DIAGNOSIS — M170 Bilateral primary osteoarthritis of knee: Secondary | ICD-10-CM

## 2021-09-15 DIAGNOSIS — M5134 Other intervertebral disc degeneration, thoracic region: Secondary | ICD-10-CM

## 2021-09-15 DIAGNOSIS — IMO0002 Ulcer: Secondary | ICD-10-CM

## 2021-09-15 DIAGNOSIS — F419 Anxiety disorder, unspecified: Secondary | ICD-10-CM

## 2021-09-15 DIAGNOSIS — M549 Dorsalgia, unspecified: Secondary | ICD-10-CM

## 2021-09-15 DIAGNOSIS — C801 Malignant (primary) neoplasm, unspecified: Secondary | ICD-10-CM

## 2021-09-15 DIAGNOSIS — M2550 Pain in unspecified joint: Secondary | ICD-10-CM

## 2021-09-15 DIAGNOSIS — D539 Nutritional anemia, unspecified: Secondary | ICD-10-CM

## 2021-09-15 DIAGNOSIS — M5030 Other cervical disc degeneration, unspecified cervical region: Secondary | ICD-10-CM

## 2021-09-15 DIAGNOSIS — F32A Depression: Secondary | ICD-10-CM

## 2021-09-15 DIAGNOSIS — I10 Essential (primary) hypertension: Secondary | ICD-10-CM

## 2021-09-15 DIAGNOSIS — S83209A Unspecified tear of unspecified meniscus, current injury, unspecified knee, initial encounter: Secondary | ICD-10-CM

## 2021-09-15 DIAGNOSIS — R011 Cardiac murmur, unspecified: Secondary | ICD-10-CM

## 2021-09-15 DIAGNOSIS — B999 Unspecified infectious disease: Secondary | ICD-10-CM

## 2021-09-15 DIAGNOSIS — R519 Generalized headaches: Secondary | ICD-10-CM

## 2021-09-15 DIAGNOSIS — C50919 Malignant neoplasm of unspecified site of unspecified female breast: Secondary | ICD-10-CM

## 2021-09-15 NOTE — Patient Instructions
Please do not hesitate to contact my office with any questions.    Dr. Bryan Vopat & Tallyn Holroyd Caldwell PA-C - Orthopedic Surgeon, Sports Medicine  [facility] - Phone [phone] - Fax [phone]   [address], [address] - [place], [place] [address]    To schedule an appointment, please call our scheduling line at [phone]    Kara Schuessler BSN, RN - Clinical Nurse Coordinator  Casey Conover BSN, RN - Clinical Nurse Coordinator  Yoshika Vensel ATC, LAT - Clinical Athletic Trainer    Thank you for supporting our practice! Your feedback helps us deliver the highest quality of care.  Review us at: [email or web address]

## 2021-09-15 NOTE — Progress Notes
The Prior Authorization for Melynda Keller was approved for Rilea Arutyunyan from 09/14/2021 to 12/31/20233.  The copay is $38.00.      Nandana Krolikowski has stated this copay is affordable.  The specialty pharmacy will pursue additional copay assistance as necessary.  The specialty pharmacy will reach out to the provider's nurse if the copay becomes unaffordable.    The medication will be delivered to patient's prescription address per the patient's request.    [facility] Patient Advocate  [phone]

## 2021-09-21 NOTE — Progress Notes
..  This prechart is intended to be a reference for patient appointments. Information is gathered from in chart as well as external records review. Information will be clarified/verified/updated in final documentation in the office visit.     Pre Clinic Pre Chart:    [name] is a 61yo female with a history of breast cancer on chemotherapy who follows up ~6wks after starting nifedipine/lidocaine for anal fissure. She felt her symptoms significantly improved after 2-3wks of topical nifedipine/liodcaine so she stopped. At her 6wk follow up the fissure was still present. She was asked to resume topical nifedipine/lidocaine and continue for 6wks despite symptom improvement. She has now completed 12wks of topical treatment.     COLONOSCOPY? BOTOX?    Problem List:  Fissure  Breast Cancer    03/16/2021 - Med Onc:  I,ve used the Prep H, baths and witch Hazel wipes, but the external hemorrhoid continues, and is frustrating. I?ve dealt with a much smaller, infrequent problem since my last couple of pregnancies but the increased irregularity since Chemo, and more irritated state  of the intestine means I?m chasing constipation vs very soft stool. I?m normally much more regular. It is not strangulated--that did happen once a decade ago and Prep H and sitz baths fixed it. But this is just stubborn. Is there a prescription ointment to switch to that Dr. Neil Crouch could prescribe? I?m past the recommended usage time of PrepH.  To the good- I?ve only needed Zofran or Compazine one a day, if that. Nausea is certainly better these last few days! Re: diet, I eat fruits and vegetables and some whole grains and legumes (little to no white flour/sugar) so I do get natural soluble and insoluble fiber.  Metamucil and or Miralax which used as directed when indicated have proved overrated in results (and make one more crampy).  This really is tiring and I could use one less very annoying consequence of Chemo.  I'm seeing Dr. Essie Christine today at [facility] for a scheduled F/U for my right knee. It still has swelling, & is sore at times. Dealing with so much. But again, in general, it?s much better than the first cycle.   I did shave my head when the hair came off in handfuls this week and I?m dealing well with that aspect of Chemo--at least! Getting walks and stationary bike sessions in!    2017 - Colonoscopy:

## 2021-09-22 ENCOUNTER — Encounter: Admit: 2021-09-22 | Discharge: 2021-09-22 | Payer: MEDICARE

## 2021-09-22 DIAGNOSIS — M5416 Radiculopathy, lumbar region: Secondary | ICD-10-CM

## 2021-09-23 ENCOUNTER — Encounter: Admit: 2021-09-23 | Discharge: 2021-09-23 | Payer: MEDICARE

## 2021-09-23 DIAGNOSIS — C73 Malignant neoplasm of thyroid gland: Secondary | ICD-10-CM

## 2021-09-23 DIAGNOSIS — B999 Unspecified infectious disease: Secondary | ICD-10-CM

## 2021-09-23 DIAGNOSIS — R011 Cardiac murmur, unspecified: Secondary | ICD-10-CM

## 2021-09-23 DIAGNOSIS — IMO0002 Ulcer: Secondary | ICD-10-CM

## 2021-09-23 DIAGNOSIS — M5136 Other intervertebral disc degeneration, lumbar region: Secondary | ICD-10-CM

## 2021-09-23 DIAGNOSIS — C801 Malignant (primary) neoplasm, unspecified: Secondary | ICD-10-CM

## 2021-09-23 DIAGNOSIS — Z1211 Encounter for screening for malignant neoplasm of colon: Secondary | ICD-10-CM

## 2021-09-23 DIAGNOSIS — M4800 Spinal stenosis, site unspecified: Secondary | ICD-10-CM

## 2021-09-23 DIAGNOSIS — R519 Generalized headaches: Secondary | ICD-10-CM

## 2021-09-23 DIAGNOSIS — F32A Depression: Secondary | ICD-10-CM

## 2021-09-23 DIAGNOSIS — K601 Chronic anal fissure: Secondary | ICD-10-CM

## 2021-09-23 DIAGNOSIS — F419 Anxiety disorder, unspecified: Secondary | ICD-10-CM

## 2021-09-23 DIAGNOSIS — M549 Dorsalgia, unspecified: Secondary | ICD-10-CM

## 2021-09-23 DIAGNOSIS — I10 Essential (primary) hypertension: Secondary | ICD-10-CM

## 2021-09-23 DIAGNOSIS — M5030 Other cervical disc degeneration, unspecified cervical region: Secondary | ICD-10-CM

## 2021-09-23 DIAGNOSIS — E039 Hypothyroidism, unspecified: Secondary | ICD-10-CM

## 2021-09-23 DIAGNOSIS — M5134 Other intervertebral disc degeneration, thoracic region: Secondary | ICD-10-CM

## 2021-09-23 DIAGNOSIS — D539 Nutritional anemia, unspecified: Secondary | ICD-10-CM

## 2021-09-23 DIAGNOSIS — M2550 Pain in unspecified joint: Secondary | ICD-10-CM

## 2021-09-23 DIAGNOSIS — C50919 Malignant neoplasm of unspecified site of unspecified female breast: Secondary | ICD-10-CM

## 2021-09-23 DIAGNOSIS — S83209A Unspecified tear of unspecified meniscus, current injury, unspecified knee, initial encounter: Secondary | ICD-10-CM

## 2021-09-23 DIAGNOSIS — E079 Disorder of thyroid, unspecified: Secondary | ICD-10-CM

## 2021-09-23 MED ORDER — SODIUM CHLORIDE 0.9 % IV SOLP
250 mL | INTRAVENOUS | 0 refills
Start: 2021-09-23 — End: ?

## 2021-09-23 NOTE — Progress Notes
Name: Cindy Randolph          MRN: [id number]      DOB: 04-06-1961      AGE: 61 y.o.   DATE OF SERVICE: 09/23/2021    Subjective:             Reason for Visit:  Follow Up      Cindy Randolph is a 61 y.o. female.      Cancer Staging   Malignant neoplasm of left breast in female, estrogen receptor positive (HCC)  Staging form: Breast, AJCC 8th Edition  - Clinical stage from 12/22/2020: Stage IA (cT1c, cN0, cM0, G1, ER+, PR+, HER2-) - Signed by Massie Kluver, MD on 12/22/2020  - Pathologic stage from 02/02/2021: Stage IIIA (pT3, pN3, cM0, G1, ER+, PR+, HER2-) - Signed by Massie Kluver, MD on 05/27/2021      History of Present Illness  Cindy Randolph is a 61yo female with a history of breast cancer on chemotherapy who follows up ~6wks after starting nifedipine/lidocaine for anal fissure. She felt her symptoms significantly improved after 2-3wks of topical nifedipine/liodcaine so she stopped. At her 6wk follow up the fissure was still present. She was asked to resume topical nifedipine/lidocaine and continue for 6wks despite symptom improvement. She has now completed 12wks of topical treatment.     She returns to clinic today for follow-up.  She has not had any pain or bleeding from her anus.  She has stopped using the topical nifedipine/lidocaine.  She started letrozole which has made her stools a little on the softer side.  She has not needed any stool softeners.  Not taking any supplemental fiber.  She is getting ready to start Verzenio in the beginning of June.  She has also read that this may cause some looser stools.  Otherwise feeling well.  Reports last colonoscopy was in 2017.  She was told at that time that she had polyps and needs to repeat in 5 years.    03/16/2021 - Med Onc:  I,ve used the Prep H, baths and witch Hazel wipes, but the external hemorrhoid continues, and is frustrating. I?ve dealt with a much smaller, infrequent problem since my last couple of pregnancies but the increased irregularity since Chemo, and more irritated state  of the intestine means I?m chasing constipation vs very soft stool. I?m normally much more regular. It is not strangulated--that did happen once a decade ago and Prep H and sitz baths fixed it. But this is just stubborn. Is there a prescription ointment to switch to that Dr. Neil Crouch could prescribe? I?m past the recommended usage time of PrepH.  To the good- I?ve only needed Zofran or Compazine one a day, if that. Nausea is certainly better these last few days! Re: diet, I eat fruits and vegetables and some whole grains and legumes (little to no white flour/sugar) so I do get natural soluble and insoluble fiber.  Metamucil and or Miralax which used as directed when indicated have proved overrated in results (and make one more crampy).  This really is tiring and I could use one less very annoying consequence of Chemo.  I'm seeing Dr. Essie Christine today at [facility] for a scheduled F/U for my right knee. It still has swelling, & is sore at times. Dealing with so much. But again, in general, it?s much better than the first cycle.   I did shave my head when the hair came off in handfuls this week and I?m dealing well  with that aspect of Chemo--at least! Getting walks and stationary bike sessions in!    2017 - Colonoscopy:    Medical History:   Diagnosis Date   ? Acquired hypothyroidism    ? Anxiety 2016    from pain   ? Back pain    ? Breast cancer in female [facility]) 12/2020    Left   ? Cancer of thyroid (HCC) 1980   ? Degenerative disc disease, cervical 1994   ? Degenerative disc disease, lumbar 2010   ? Degenerative disc disease, thoracic 2010   ? Depression situational, transient    after divorce, after death of 2nd husband   ? Essential hypertension began in 3rd trimester most pregnancies    remained after last pregnancy   ? Essential hypertension, benign as above   ? Generalized headaches 1996-2010    probably hormonally related   ? Heart murmur at birth    Dr. Maisie Fus said he didn't hear it a few years ago   ? Infection    ? Joint pain 1994   ? Other malignant neoplasm without specification of site thyroid, 1980    thyroidectomy   ? Spinal stenosis 2017 to present    per MRIs   ? Thyroid disorder as noted   ? Torn meniscus 12/25/2020   ? Ulcer 1987    with divorce; resolved   ? Unspecified deficiency anemia 2009    from excessive bleeding post-partum; treated iron     Surgical History:   Procedure Laterality Date   ? HX WRIST FRACTURE SURGERY  1993    Baker's thumb with fixation   ? ARTHROPLASTY  2001    L ACL   ? COLONOSCOPY  2017    normal   ? ARTHROSCOPY KNEE WITH PARTIAL LATERAL MENISCECTOMY AND LEFT KNEE INJECTION. Right 01/08/2021    Performed by Vopat, Lowry Ram, MD at [facility] OR   ? BILATERAL TOTAL MASTECTOMIES Bilateral 01/22/2021    Performed by Massie Kluver, MD at [facility] OR   ? INTRAOPERATIVE SENTINEL LYMPH NODE IDENTIFICATION WITH/ WITHOUT NON-RADIOACTIVE DYE INJECTION Left 01/22/2021    Performed by Massie Kluver, MD at [facility] OR   ? INJECTION RADIOACTIVE TRACER FOR SENTINEL NODE IDENTIFICATION Left 01/22/2021    Performed by Massie Kluver, MD at [facility] OR   ? LEFT AXILLARY SENTINEL LYMPH NODE BIOPSY Left 01/22/2021    Performed by Massie Kluver, MD at [facility] OR   ? BILATERAL CHEST FLAT CLOSURE Bilateral 01/22/2021    Performed by Erlene Quan, Zachary George, MD at [facility] OR   ? BILATERAL CHEST FLAT CLOSURE x 8 Bilateral 01/22/2021    Performed by Stevenson Clinch, MD at [facility] OR   ? EXCISION BENIGN LESION 0.5 CM OR LESS - TORSO Right 01/22/2021    Performed by Erlene Quan, Zachary George, MD at [facility] OR   ? TUNNELED VENOUS PORT PLACEMENT Right 02/19/2021   ? Placement of port-a-cath - 80F Right 02/19/2021    Performed by Freund, Alecia Lemming., MD at [facility] OR   ? FLUOROSCOPIC GUIDANCE CENTRAL VENOUS ACCESS DEVICE PLACEMENT/ REPLACEMENT/ REMOVAL N/A 02/19/2021    Performed by Flo Shanks, Alecia Lemming., MD at [facility] OR   ? Left Completion Axillary Lymph Node Dissection Left 05/14/2021    Performed by Massie Kluver, MD at [facility] OR   ? REMOVAL TUNNELED CENTRAL VENOUS ACCESS DEVICE INCLUDING PORT/ PUMP Right 05/14/2021    Performed by Massie Kluver, MD at  [facility] OR   ? KNEE SURGERY  L ACL as above   ? THYROIDECTOMY       Family History   Problem Relation Age of Onset   ? Arthritis Paternal Grandmother         (osteoarthritis) multiple heberdens noduls and deformities   ? Back pain Paternal Grandmother    ? Arthritis Mother         wear and tear   ? Back pain Mother    ? Hypertension Mother    ? Joint Pain Mother    ? Neck Pain Mother    ? Cancer-Breast Mother 74        was on HRT for 10-15 years   ? Basal Cell Carcinoma Brother    ? Back pain Brother    ? Hypertension Brother    ? Basal Cell Carcinoma Brother    ? Back pain Brother         has had surgery   ? Hypertension Brother    ? Basal Cell Carcinoma Brother    ? Back pain Brother    ? Hypertension Brother    ? Diabetes Father         Type 2   ? Heart problem Father         CABG 3   ? Heart Disease Father    ? Hypertension Father    ? Diabetes Maternal Grandfather         Type 2   ? Birth Defect Daughter         PRS, congenital diaphragmatic hernia, malrotation, grey matter heterotopia   ? Stroke Maternal Uncle    ? Thyroid Disease Maternal Uncle    ? Diabetes Paternal Grandfather    ? Birth Defect Nephew         craniosynostosis   ? Cancer-Breast Maternal Great-Aunt 41     Social History     Socioeconomic History   ? Marital status: Married   Occupational History   ? Occupation: retired Engineer, civil (consulting)   Tobacco Use   ? Smoking status: Never     Passive exposure: Past   ? Smokeless tobacco: Never   Vaping Use   ? Vaping Use: Never used   Substance and Sexual Activity   ? Alcohol use: Never     Comment: one per month beer   ? Drug use: Never     Comment: in late teens, early twenties, a few times and not since   ? Sexual activity: Yes     Partners: Male     Birth control/protection: Post-menopausal, None     Vaping/E-liquid Use   ? Vaping Use Never User Review of Systems  Review of Systems   Constitutional: Positive for appetite change and fatigue.   HENT: Positive for postnasal drip and tinnitus.    Respiratory: Positive for apnea and chest tightness.    Endocrine: Positive for polyphagia.   Musculoskeletal: Positive for arthralgias, back pain, joint swelling and myalgias.   Allergic/Immunologic: Positive for environmental allergies.   Neurological: Positive for dizziness, light-headedness and numbness.   Psychiatric/Behavioral: Positive for agitation, decreased concentration, dysphoric mood and sleep disturbance.   All other systems reviewed and are negative.    Objective:         ? abemaciclib (VERZENIO) 100 mg tablet Take one tablet by mouth twice daily.   ? acetaminophen (TYLENOL) 325 mg tablet Take two tablets by mouth every 6 hours. Take scheduled for 3 days after surgery, then as  needed. Do not exceed 4,000mg  in a 24 hour period.   ? ascorbic acid (VITAMIN C PO) Take  by mouth.   ? biotin 1 mg cap Take  by mouth.   ? CALCIUM PO Take  by mouth.   ? cholecalciferol (vitamin D3) (VITAMIN D3 PO) Take  by mouth.   ? letrozole (FEMARA) 2.5 mg tablet Take one tablet by mouth daily.   ? lisinopriL (ZESTRIL) 2.5 mg tablet Take one tablet by mouth daily.   ? mupirocin (CENTANY) 2 % topical ointment Apply  topically to affected area twice daily.   ? nifedipine 0.3%/lidocaine 1.5%/white petrolatum(#) Apply a pea sized amount to the anal opening two to four times a day. Do NOT insert into anus.   ? SYNTHROID 125 mcg tablet Take one tablet by mouth daily 30 minutes before breakfast. (Patient taking differently: Take 112 mcg by mouth daily 30 minutes before breakfast.)   ? traMADoL (ULTRAM) 50 mg tablet Take one tablet by mouth at bedtime as needed for Pain.   ? vitamins, B complex tab Take one tablet by mouth three times weekly.   ? vitamins, multiple cap Take one capsule by mouth daily.     Vitals:    09/23/21 0829   BP: (!) 116/90   BP Source: Arm, Right Upper   Pulse: 73   Temp: 36.6 ?C (97.8 ?F)   Resp: 14   SpO2: 99%   TempSrc: Temporal   PainSc: Five   Weight: 67.5 kg (148 lb 12.8 oz)     Body mass index is 26.36 kg/m?Marland Kitchen     Pain Score: Five  Pain Loc: Generalized    Fatigue Scale: 5         Physical Exam  Vitals reviewed.   Constitutional:       General: She is not in acute distress.     Appearance: Normal appearance. She is well-developed.   HENT:      Head: Normocephalic and atraumatic.      Nose: Nose normal.   Eyes:      General: Lids are normal.      Conjunctiva/sclera: Conjunctivae normal.   Pulmonary:      Effort: Pulmonary effort is normal. No respiratory distress.   Genitourinary:     Comments: External: Healed anal fissure. Small enlargement of external hemorrhoids. Otherwise normal perianal skin.  Musculoskeletal:         General: Normal range of motion.      Cervical back: Normal range of motion.   Neurological:      Mental Status: She is alert and oriented to person, place, and time.   Psychiatric:         Speech: Speech normal.         Behavior: Behavior normal.         Thought Content: Thought content normal.         Judgment: Judgment normal.               Assessment and Plan:  61yo female with anal fissure now healed.     Health Maintenance:  - Last colonoscopy reportedly in 2017 and was found to have polyps. She was told to repeat in 37yrs. This report has been attempted to be obtained but we have been unable to get this.   - Will arrange colonoscopy prior to starting new medication for breast cancer.   - Plan to proceed with colonoscopy.   - CONSENT DAY OF SURGERY.     Anal  Fissure:  - Resolved.   - We had a thorough discussion on fiber consumption and how to maximize it. Kalei Meda was instructed on dietary fiber totals and how to increase this. She also understands that the patient can use supplemental fiber through over the counter citrocel, metamucil, or benefiber. I advise 1 scoop of powdered fiber daily for one week and then increase to 2 scoops on week two. Avoid fiber pills or gummies.  She also knows to maximize water consumption with eight 8 ounce glasses per day. A handout was provided.    Plan:  - RTC post operatively.     .The patient and family were allowed to ask questions and voice concerns; these were addressed to the best of our ability. They expressed understanding of what was explained to them, and they agreed with the present plan. Patient has the phone numbers for the Cancer Center and was instructed on how to contact us with any questions or concerns.    My collaborating provider on this patient is Dr. Deedra Ehrich MD.    Brunilda Payor. Fredricka Bonine MSN, APRN, AGCNS-BC, AGPCNP-BC  Advanced Practice Provider  Colon and Rectal Surgery  Surgical Oncology  APP for Dr. Kathee Delton, Dr. Daphine Deutscher and Dr. Daiva Nakayama  Pager: [phone]  Available via Amie Critchley and [facility]

## 2021-09-24 ENCOUNTER — Encounter: Admit: 2021-09-24 | Discharge: 2021-09-24 | Payer: MEDICARE

## 2021-09-24 ENCOUNTER — Ambulatory Visit: Admit: 2021-09-24 | Discharge: 2021-09-24 | Payer: MEDICARE

## 2021-09-24 DIAGNOSIS — Z1211 Encounter for screening for malignant neoplasm of colon: Secondary | ICD-10-CM

## 2021-09-25 ENCOUNTER — Encounter: Admit: 2021-09-25 | Discharge: 2021-09-25 | Payer: MEDICARE

## 2021-09-25 MED ORDER — ESCITALOPRAM OXALATE 10 MG PO TAB
10 mg | ORAL_TABLET | Freq: Every day | ORAL | 3 refills | Status: AC
Start: 2021-09-25 — End: ?

## 2021-10-02 ENCOUNTER — Encounter: Admit: 2021-10-02 | Discharge: 2021-10-02 | Payer: MEDICARE

## 2021-10-02 DIAGNOSIS — I10 Essential (primary) hypertension: Secondary | ICD-10-CM

## 2021-10-02 DIAGNOSIS — E039 Hypothyroidism, unspecified: Secondary | ICD-10-CM

## 2021-10-02 DIAGNOSIS — M4800 Spinal stenosis, site unspecified: Secondary | ICD-10-CM

## 2021-10-02 DIAGNOSIS — IMO0002 Ulcer: Secondary | ICD-10-CM

## 2021-10-02 DIAGNOSIS — F419 Anxiety disorder, unspecified: Secondary | ICD-10-CM

## 2021-10-02 DIAGNOSIS — D539 Nutritional anemia, unspecified: Secondary | ICD-10-CM

## 2021-10-02 DIAGNOSIS — Z789 Other specified health status: Secondary | ICD-10-CM

## 2021-10-02 DIAGNOSIS — R011 Cardiac murmur, unspecified: Secondary | ICD-10-CM

## 2021-10-02 DIAGNOSIS — S83209A Unspecified tear of unspecified meniscus, current injury, unspecified knee, initial encounter: Secondary | ICD-10-CM

## 2021-10-02 DIAGNOSIS — C73 Malignant neoplasm of thyroid gland: Secondary | ICD-10-CM

## 2021-10-02 DIAGNOSIS — M5134 Other intervertebral disc degeneration, thoracic region: Secondary | ICD-10-CM

## 2021-10-02 DIAGNOSIS — R519 Generalized headaches: Secondary | ICD-10-CM

## 2021-10-02 DIAGNOSIS — M5030 Other cervical disc degeneration, unspecified cervical region: Secondary | ICD-10-CM

## 2021-10-02 DIAGNOSIS — F32A Depression: Secondary | ICD-10-CM

## 2021-10-02 DIAGNOSIS — M2550 Pain in unspecified joint: Secondary | ICD-10-CM

## 2021-10-02 DIAGNOSIS — C801 Malignant (primary) neoplasm, unspecified: Secondary | ICD-10-CM

## 2021-10-02 DIAGNOSIS — E079 Disorder of thyroid, unspecified: Secondary | ICD-10-CM

## 2021-10-02 DIAGNOSIS — B999 Unspecified infectious disease: Secondary | ICD-10-CM

## 2021-10-02 DIAGNOSIS — M549 Dorsalgia, unspecified: Secondary | ICD-10-CM

## 2021-10-02 DIAGNOSIS — C50919 Malignant neoplasm of unspecified site of unspecified female breast: Secondary | ICD-10-CM

## 2021-10-02 DIAGNOSIS — M5136 Other intervertebral disc degeneration, lumbar region: Secondary | ICD-10-CM

## 2021-10-08 ENCOUNTER — Encounter: Admit: 2021-10-08 | Discharge: 2021-10-08 | Payer: MEDICARE

## 2021-10-08 DIAGNOSIS — C50112 Malignant neoplasm of central portion of left female breast: Secondary | ICD-10-CM

## 2021-10-08 MED ORDER — VERZENIO 100 MG PO TAB
100 mg | ORAL_TABLET | Freq: Two times a day (BID) | ORAL | 0 refills
Start: 2021-10-08 — End: ?

## 2021-10-12 ENCOUNTER — Encounter: Admit: 2021-10-12 | Discharge: 2021-10-12 | Payer: MEDICARE

## 2021-10-12 MED ORDER — LEVOTHYROXINE 112 MCG PO TAB
112 ug | ORAL_TABLET | Freq: Every day | ORAL | 3 refills | 30.00000 days | Status: AC
Start: 2021-10-12 — End: ?

## 2021-10-13 ENCOUNTER — Encounter: Admit: 2021-10-13 | Discharge: 2021-10-13 | Payer: MEDICARE

## 2021-10-13 NOTE — Anesthesia Pre-Procedure Evaluation
Anesthesia Pre-Procedure Evaluation    Name: Cindy Randolph      MRN: [id number]     DOB: [birth year]     Age: 61 y.o.     Sex: female   _________________________________________________________________________     Procedure Info:   Procedure Information     Date/Time: 10/14/21 0945    Procedure: COLONOSCOPY DIAGNOSTIC WITH SPECIMEN COLLECTION BY BRUSHING/ WASHING - FLEXIBLE    [facility]    Surgeons: Benetta Spar, MD          Physical Assessment  Vital Signs (last filed in past 24 hours):         Patient History   Allergies   Allergen Reactions   ? Oxycodone NAUSEA ONLY     Prefers tramadol   ? Sudafed [Pseudoephedrine Hcl] PALPITATIONS        Current Medications    Medication Directions   abemaciclib (VERZENIO) 100 mg tablet Take one tablet by mouth twice daily.   acetaminophen (TYLENOL) 325 mg tablet Take two tablets by mouth every 6 hours. Take scheduled for 3 days after surgery, then as needed. Do not exceed 4,000mg  in a 24 hour period.   ascorbic acid (VITAMIN C PO) Take  by mouth.   biotin 1 mg cap Take  by mouth.   CALCIUM PO Take  by mouth.   cholecalciferol (vitamin D3) (VITAMIN D3 PO) Take  by mouth.   escitalopram oxalate (LEXAPRO) 10 mg tablet Take one tablet by mouth daily.   letrozole [facility]) 2.5 mg tablet Take one tablet by mouth daily.   levothyroxine (SYNTHROID) 112 mcg tablet Take one tablet by mouth daily 30 minutes before breakfast.   lisinopriL (ZESTRIL) 2.5 mg tablet Take one tablet by mouth daily.   mupirocin (CENTANY) 2 % topical ointment Apply  topically to affected area twice daily.   nifedipine 0.3%/lidocaine 1.5%/white petrolatum(#) Apply a pea sized amount to the anal opening two to four times a day. Do NOT insert into anus.   traMADoL (ULTRAM) 50 mg tablet Take one tablet by mouth at bedtime as needed for Pain.   vitamins, B complex tab Take one tablet by mouth three times weekly.   vitamins, multiple cap Take one capsule by mouth daily.         Review of Systems/Medical History      Patient summary reviewed  Nursing notes reviewed  Pertinent labs reviewed    PONV Screening: Non-smoker, Female sex and Postoperative opioids  No history of anesthetic complications  No family history of anesthetic complications      Airway - negative        Pulmonary - negative          Cardiovascular         Exercise tolerance: >4 METS      Beta Blocker therapy: No      Beta blockers within 24 hours: n/a        Hypertension, well controlled        Pt denies any h/o MI, CHF or significant arrhythmias.  Pt denies any CP or SOB with > 4 metabolic equivalents.      TTE 01/2021:  ? Left Ventricle: The left ventricular size is normal. Concentric remodeling. The left ventricular systolic function is normal. The ejection fraction by Simpson's biplane method is 55%. The global longitudinal strain is -17%. There are no segmental wall motion abnormalities. Normal left ventricular diastolic function. Normal left atrial pressure.  ?  Right Ventricle: The right ventricular size is normal. The right ventricular systolic function is normal.  ? Left Atrium: Normal size. Right Atrium: Normal size.  ? No hemodynamically significant valvular abnormalities.  ? The aortic root is normal in size. Mildly dilated ascending aorta.  ? Estimated Peak Systolic PA Pressure 22 mmHg  ? Normal central venous pressure        GI/Hepatic/Renal         Peptic ulcer disease (h/o)        Neuro/Psych         Headaches      Musculoskeletal         Back pain      Arthritis:       Endocrine/Other         Hypothyroidism      Malignancy (h/o breast and thyroid cancer):      Constitution - negative   Physical Exam    Airway Findings      Mallampati: II      TM distance: >3 FB      Neck ROM: full      Mouth opening: good      Airway patency: adequate    Dental Findings: Negative      Cardiovascular Findings:       Rhythm: regular      Rate: normal    Pulmonary Findings:       Breath sounds clear to auscultation.    Neurological Findings:       Alert and oriented x 3    Constitutional findings:       No acute distress       Diagnostic Tests  Hematology:   Lab Results   Component Value Date    HGB 12.1 09/14/2021    HCT 35.6 09/14/2021    PLTCT 205 09/14/2021    WBC 5.8 09/14/2021    NEUT 69 09/14/2021    ANC 4.10 09/14/2021    LYMPH 11 04/20/2021    ALC 1.00 09/14/2021    MONA 9 09/14/2021    AMC 0.50 09/14/2021    EOSA 3 09/14/2021    ABC 0.00 09/14/2021    BASOPHILS 2 04/20/2021    MCV 92.4 09/14/2021    MCH 31.4 09/14/2021    MCHC 34.0 09/14/2021    MPV 8.4 09/14/2021    RDW 14.2 09/14/2021         General Chemistry:   Lab Results   Component Value Date    NA 136 09/14/2021    K 4.0 09/14/2021    CL 100 09/14/2021    CO2 28 09/14/2021    GAP 8 09/14/2021    BUN 14 09/14/2021    CR 0.73 09/14/2021    GLU 130 09/14/2021    CA 9.6 09/14/2021    ALBUMIN 4.5 09/14/2021    TOTBILI 0.4 09/14/2021    PO4 3.8 09/14/2021      Coagulation: No results found for: PT, PTT, INR      Anesthesia Plan    ASA score: 2   Plan: MAC  NPO status: acceptable      Informed Consent  Anesthetic plan and risks discussed with patient.  Use of blood products discussed with patient      Plan discussed with: CRNA.

## 2021-10-14 ENCOUNTER — Encounter: Admit: 2021-10-14 | Discharge: 2021-10-14 | Payer: MEDICARE

## 2021-10-14 ENCOUNTER — Ambulatory Visit: Admit: 2021-10-14 | Discharge: 2021-10-14 | Payer: MEDICARE

## 2021-10-14 DIAGNOSIS — Z789 Other specified health status: Secondary | ICD-10-CM

## 2021-10-14 DIAGNOSIS — D539 Nutritional anemia, unspecified: Secondary | ICD-10-CM

## 2021-10-14 DIAGNOSIS — M2550 Pain in unspecified joint: Secondary | ICD-10-CM

## 2021-10-14 DIAGNOSIS — C50919 Malignant neoplasm of unspecified site of unspecified female breast: Secondary | ICD-10-CM

## 2021-10-14 DIAGNOSIS — C73 Malignant neoplasm of thyroid gland: Secondary | ICD-10-CM

## 2021-10-14 DIAGNOSIS — F419 Anxiety disorder, unspecified: Secondary | ICD-10-CM

## 2021-10-14 DIAGNOSIS — R519 Generalized headaches: Secondary | ICD-10-CM

## 2021-10-14 DIAGNOSIS — M4800 Spinal stenosis, site unspecified: Secondary | ICD-10-CM

## 2021-10-14 DIAGNOSIS — IMO0002 Ulcer: Secondary | ICD-10-CM

## 2021-10-14 DIAGNOSIS — M549 Dorsalgia, unspecified: Secondary | ICD-10-CM

## 2021-10-14 DIAGNOSIS — E039 Hypothyroidism, unspecified: Secondary | ICD-10-CM

## 2021-10-14 DIAGNOSIS — C801 Malignant (primary) neoplasm, unspecified: Secondary | ICD-10-CM

## 2021-10-14 DIAGNOSIS — M5030 Other cervical disc degeneration, unspecified cervical region: Secondary | ICD-10-CM

## 2021-10-14 DIAGNOSIS — E079 Disorder of thyroid, unspecified: Secondary | ICD-10-CM

## 2021-10-14 DIAGNOSIS — B999 Unspecified infectious disease: Secondary | ICD-10-CM

## 2021-10-14 DIAGNOSIS — I10 Essential (primary) hypertension: Secondary | ICD-10-CM

## 2021-10-14 DIAGNOSIS — S83209A Unspecified tear of unspecified meniscus, current injury, unspecified knee, initial encounter: Secondary | ICD-10-CM

## 2021-10-14 DIAGNOSIS — F32A Depression: Secondary | ICD-10-CM

## 2021-10-14 DIAGNOSIS — R011 Cardiac murmur, unspecified: Secondary | ICD-10-CM

## 2021-10-14 DIAGNOSIS — M5134 Other intervertebral disc degeneration, thoracic region: Secondary | ICD-10-CM

## 2021-10-14 DIAGNOSIS — M5136 Other intervertebral disc degeneration, lumbar region: Secondary | ICD-10-CM

## 2021-10-14 MED ORDER — LIDOCAINE (PF) 20 MG/ML (2 %) IJ SOLN
INTRAVENOUS | 0 refills | Status: DC
Start: 2021-10-14 — End: 2021-10-14

## 2021-10-14 MED ORDER — PROPOFOL 10 MG/ML IV EMUL 50 ML (INFUSION)(AM)(OR)
INTRAVENOUS | 0 refills | Status: DC
Start: 2021-10-14 — End: 2021-10-14

## 2021-10-15 ENCOUNTER — Encounter: Admit: 2021-10-15 | Discharge: 2021-10-15 | Payer: MEDICARE

## 2021-10-15 DIAGNOSIS — M5136 Other intervertebral disc degeneration, lumbar region: Secondary | ICD-10-CM

## 2021-10-15 DIAGNOSIS — E039 Hypothyroidism, unspecified: Secondary | ICD-10-CM

## 2021-10-15 DIAGNOSIS — I10 Essential (primary) hypertension: Secondary | ICD-10-CM

## 2021-10-15 DIAGNOSIS — F32A Depression: Secondary | ICD-10-CM

## 2021-10-15 DIAGNOSIS — M2550 Pain in unspecified joint: Secondary | ICD-10-CM

## 2021-10-15 DIAGNOSIS — D539 Nutritional anemia, unspecified: Secondary | ICD-10-CM

## 2021-10-15 DIAGNOSIS — M5134 Other intervertebral disc degeneration, thoracic region: Secondary | ICD-10-CM

## 2021-10-15 DIAGNOSIS — S83209A Unspecified tear of unspecified meniscus, current injury, unspecified knee, initial encounter: Secondary | ICD-10-CM

## 2021-10-15 DIAGNOSIS — C73 Malignant neoplasm of thyroid gland: Secondary | ICD-10-CM

## 2021-10-15 DIAGNOSIS — F419 Anxiety disorder, unspecified: Secondary | ICD-10-CM

## 2021-10-15 DIAGNOSIS — R519 Generalized headaches: Secondary | ICD-10-CM

## 2021-10-15 DIAGNOSIS — M549 Dorsalgia, unspecified: Secondary | ICD-10-CM

## 2021-10-15 DIAGNOSIS — C801 Malignant (primary) neoplasm, unspecified: Secondary | ICD-10-CM

## 2021-10-15 DIAGNOSIS — E079 Disorder of thyroid, unspecified: Secondary | ICD-10-CM

## 2021-10-15 DIAGNOSIS — C50919 Malignant neoplasm of unspecified site of unspecified female breast: Secondary | ICD-10-CM

## 2021-10-15 DIAGNOSIS — R011 Cardiac murmur, unspecified: Secondary | ICD-10-CM

## 2021-10-15 DIAGNOSIS — IMO0002 Ulcer: Secondary | ICD-10-CM

## 2021-10-15 DIAGNOSIS — Z789 Other specified health status: Secondary | ICD-10-CM

## 2021-10-15 DIAGNOSIS — M4800 Spinal stenosis, site unspecified: Secondary | ICD-10-CM

## 2021-10-15 DIAGNOSIS — B999 Unspecified infectious disease: Secondary | ICD-10-CM

## 2021-10-15 DIAGNOSIS — M5030 Other cervical disc degeneration, unspecified cervical region: Secondary | ICD-10-CM

## 2021-10-25 ENCOUNTER — Encounter: Admit: 2021-10-25 | Discharge: 2021-10-25 | Payer: MEDICARE

## 2021-10-26 ENCOUNTER — Encounter: Admit: 2021-10-26 | Discharge: 2021-10-26 | Payer: MEDICARE

## 2021-10-28 ENCOUNTER — Encounter: Admit: 2021-10-28 | Discharge: 2021-10-28 | Payer: MEDICARE

## 2021-10-28 DIAGNOSIS — C50112 Malignant neoplasm of central portion of left female breast: Secondary | ICD-10-CM

## 2021-10-28 NOTE — Progress Notes
Clinical Nutrition Assessment Summary    Cindy Randolph is a 61 y.o. female with Malignant neoplasm of left breast in female, estrogen receptor positive    Past Medical History:Anxiety, heachaches    Current Treatment Plan:ABEMACICLIB + ENDOCRINE THERAPY (ADJUVANT), Zometa    Nutrition Assessment of Patient:  Height: 158.8 cm (5' 2.52")  BMI Categories Adult: Over Weight: 25-29.9  Desired Weight: 62 kg  Estimated Calorie Needs: 1300-1550 (21-25)  Estimated Protein Needs: 74 (1.2)  Needs to promote: weight maintainence (prevent weight gain)        Wt Readings from Last 5 Encounters:   10/14/21 63.5 kg (140 lb)   09/23/21 67.5 kg (148 lb 12.8 oz)   09/15/21 67.8 kg (149 lb 6.4 oz)   09/14/21 67.5 kg (148 lb 12.8 oz)   08/26/21 66.3 kg (146 lb 1.6 oz)      Was able to reach Cindy Randolph later today. She has questions about diarrhea and what to eat, specific nutrition goals &  how much water to drink.  Explained diarrhea a common side effect of Verzenio and discussed foods to avoid with diarrhea.  Discussed nutrition goal for calories and protein and importance of protein intake to reduce fatigue.   Encourage 64 oz total fluid intake daily.  Remain available prn. Will cancel next week's appointment.    Cindy Randolph RDN, [place], [place]  Oncology Nutrition Specialist  [phone]

## 2021-10-28 NOTE — Progress Notes
Clinical Nutrition Assessment Summary    Cindy Randolph is a 61 y.o. female with Malignant neoplasm of left breast in female, estrogen receptor positive     Past Medical History: Anxiety, heachaches    Current Treatment Plan: ABEMACICLIB + ENDOCRINE THERAPY (ADJUVANT), Zometa    Nutrition Assessment of Patient:  Height: 158.8 cm (5' 2.52")  BMI Categories Adult: Over Weight: 25-29.9  Desired Weight: 62 kg  Estimated Calorie Needs: 2023-3435 (21-25)  Estimated Protein Needs: 74 (1.2)  Needs to promote: weight maintainence (prevent weight gain)    Wt Readings from Last 5 Encounters:   10/14/21 63.5 kg (140 lb)   09/23/21 67.5 kg (148 lb 12.8 oz)   09/15/21 67.8 kg (149 lb 6.4 oz)   09/14/21 67.5 kg (148 lb 12.8 oz)   08/26/21 66.3 kg (146 lb 1.6 oz)      Unable to reach Patient for consultation 2 times today. Left VM with contact information and plan to contact Patient 11/04/21.    Philippa Chester RDN, [place], [place]  Oncology Nutrition Specialist  [phone]

## 2021-11-06 NOTE — Progress Notes
..  This prechart is intended to be a reference for patient appointments. Information is gathered from in chart as well as external records review. Information will be clarified/verified/updated in final documentation in the office visit.     Pre Clinic Pre Chart:    Cindy Randolph is a 61 year old female with a history of stage IIIA left breast cancer and recently healed anal fissure who presents for a surveillance colonoscopy.  Her last colonoscopy was in 2017 at which point she reports some polyps were removed. She also relates recurrent anal pain over the last few weeks with concern for recurrent fissure.  She underwent a colonoscopy on 10/14/2021 which was normal.     Problem List:  Fissure  Breast Cancer    10/14/2021 - 1) Colonoscopy  Findings:  Clonoscopy with no polyps found; small diverticulum noted in transverse colon.

## 2021-11-09 ENCOUNTER — Encounter: Admit: 2021-11-09 | Discharge: 2021-11-09 | Payer: MEDICARE

## 2021-11-09 ENCOUNTER — Ambulatory Visit: Admit: 2021-11-09 | Discharge: 2021-11-09 | Payer: MEDICARE

## 2021-11-09 DIAGNOSIS — C50112 Malignant neoplasm of central portion of left female breast: Secondary | ICD-10-CM

## 2021-11-09 LAB — COMPREHENSIVE METABOLIC PANEL
ALK PHOSPHATASE: 56 U/L (ref 25–110)
BLD UREA NITROGEN: 16 mg/dL (ref 7–25)
CHLORIDE: 102 MMOL/L — ABNORMAL LOW (ref 98–110)
EGFR: >60 mL/min (ref >60)
TOTAL BILIRUBIN: 0.5 mg/dL (ref 0.3–1.2)

## 2021-11-09 LAB — CBC AND DIFF
ABSOLUTE NEUTROPHIL: 1.6 K/UL — ABNORMAL LOW (ref 1.8–7.0)
HEMATOCRIT: 37 % — ABNORMAL HIGH (ref 36–45)
LYMPHOCYTES %: 30 % (ref 24–44)
MCH: 31 pg (ref 26–34)
MONOCYTES %: 8 % (ref 4–12)
NEUTROPHILS %: 56 % (ref 41–77)
RDW: 13 % (ref >60)

## 2021-11-11 ENCOUNTER — Encounter: Admit: 2021-11-11 | Discharge: 2021-11-11 | Payer: MEDICARE

## 2021-11-11 DIAGNOSIS — S83209A Unspecified tear of unspecified meniscus, current injury, unspecified knee, initial encounter: Secondary | ICD-10-CM

## 2021-11-11 DIAGNOSIS — M5136 Other intervertebral disc degeneration, lumbar region: Secondary | ICD-10-CM

## 2021-11-11 DIAGNOSIS — K601 Chronic anal fissure: Secondary | ICD-10-CM

## 2021-11-11 DIAGNOSIS — M2550 Pain in unspecified joint: Secondary | ICD-10-CM

## 2021-11-11 DIAGNOSIS — C801 Malignant (primary) neoplasm, unspecified: Secondary | ICD-10-CM

## 2021-11-11 DIAGNOSIS — M4800 Spinal stenosis, site unspecified: Secondary | ICD-10-CM

## 2021-11-11 DIAGNOSIS — E079 Disorder of thyroid, unspecified: Secondary | ICD-10-CM

## 2021-11-11 DIAGNOSIS — C50919 Malignant neoplasm of unspecified site of unspecified female breast: Secondary | ICD-10-CM

## 2021-11-11 DIAGNOSIS — R011 Cardiac murmur, unspecified: Secondary | ICD-10-CM

## 2021-11-11 DIAGNOSIS — B999 Unspecified infectious disease: Secondary | ICD-10-CM

## 2021-11-11 DIAGNOSIS — Z789 Other specified health status: Secondary | ICD-10-CM

## 2021-11-11 DIAGNOSIS — M549 Dorsalgia, unspecified: Secondary | ICD-10-CM

## 2021-11-11 DIAGNOSIS — F32A Depression: Secondary | ICD-10-CM

## 2021-11-11 DIAGNOSIS — M5030 Other cervical disc degeneration, unspecified cervical region: Secondary | ICD-10-CM

## 2021-11-11 DIAGNOSIS — IMO0002 Ulcer: Secondary | ICD-10-CM

## 2021-11-11 DIAGNOSIS — F419 Anxiety disorder, unspecified: Secondary | ICD-10-CM

## 2021-11-11 DIAGNOSIS — M5134 Other intervertebral disc degeneration, thoracic region: Secondary | ICD-10-CM

## 2021-11-11 DIAGNOSIS — R519 Generalized headaches: Secondary | ICD-10-CM

## 2021-11-11 DIAGNOSIS — E039 Hypothyroidism, unspecified: Secondary | ICD-10-CM

## 2021-11-11 DIAGNOSIS — C73 Malignant neoplasm of thyroid gland: Secondary | ICD-10-CM

## 2021-11-11 DIAGNOSIS — D539 Nutritional anemia, unspecified: Secondary | ICD-10-CM

## 2021-11-11 DIAGNOSIS — I10 Essential (primary) hypertension: Secondary | ICD-10-CM

## 2021-11-11 NOTE — Progress Notes
Name: Cindy Randolph          MRN: [id number]      DOB: Apr 25, 1961      AGE: 61 y.o.   DATE OF SERVICE: 11/11/2021    Subjective:             Reason for Visit:  Post Operative Visit      Cindy Randolph is a 61 y.o. female.      Cancer Staging   Malignant neoplasm of left breast in female, estrogen receptor positive (HCC)  Staging form: Breast, AJCC 8th Edition  - Clinical stage from 12/22/2020: Stage IA (cT1c, cN0, cM0, G1, ER+, PR+, HER2-) - Signed by Massie Kluver, MD on 12/22/2020  - Pathologic stage from 02/02/2021: Stage IIIA (pT3, pN3, cM0, G1, ER+, PR+, HER2-) - Signed by Massie Kluver, MD on 05/27/2021      History of Present Illness  Cindy Randolph is a 61 year old female with a history of stage IIIA left breast cancer and recently healed anal fissure who presents for a surveillance colonoscopy.  Her last colonoscopy was in 2017 at which point she reports some polyps were removed. She also relates recurrent anal pain over with concern for recurrent fissure.  She underwent a colonoscopy on 10/14/2021 which was normal. She returns to clinic today for post operative appointment. She is doing well. No perianal pain or bleeding. Had 1 episode of diarrhea likely treatment related. Treated with 1tab of imodium and she didn't have a bowel movement for 1 day. Not taking any fiber because stools are already soft and she had an episode of constipation with it.     10/14/2021 - 1) Colonoscopy  Findings:  Clonoscopy with no polyps found; small diverticulum noted in transverse colon.    Medical History:   Diagnosis Date   ? Acquired hypothyroidism    ? Anxiety 2016    from pain   ? Back pain    ? Breast cancer in female [facility]) 12/2020    Left   ? Cancer of thyroid (HCC) 1980   ? Degenerative disc disease, cervical 1994   ? Degenerative disc disease, lumbar 2010   ? Degenerative disc disease, thoracic 2010   ? Depression situational, transient    after divorce, after death of 2nd husband   ? Essential hypertension began in 3rd trimester most pregnancies    remained after last pregnancy   ? Essential hypertension, benign as above   ? Generalized headaches 1996-2010    probably hormonally related   ? Heart murmur at birth    Dr. Maisie Fus said he didn't hear it a few years ago   ? Infection    ? Joint pain 1994   ? Limb alert care status     No access on R-arm   ? Other malignant neoplasm without specification of site thyroid, 1980    thyroidectomy   ? Spinal stenosis 2017 to present    per MRIs   ? Thyroid disorder as noted   ? Torn meniscus 12/25/2020   ? Ulcer 1987    with divorce; resolved   ? Unspecified deficiency anemia 2009    from excessive bleeding post-partum; treated iron     Surgical History:   Procedure Laterality Date   ? HX WRIST FRACTURE SURGERY  1993    Baker's thumb with fixation   ? ARTHROPLASTY  2001    L ACL   ? COLONOSCOPY  2017    normal   ?  ARTHROSCOPY KNEE WITH PARTIAL LATERAL MENISCECTOMY AND LEFT KNEE INJECTION. Right 01/08/2021    Performed by Vopat, Lowry Ram, MD at [facility]   ? BILATERAL TOTAL MASTECTOMIES Bilateral 01/22/2021    Performed by Massie Kluver, MD at [facility] OR   ? INTRAOPERATIVE SENTINEL LYMPH NODE IDENTIFICATION WITH/ WITHOUT NON-RADIOACTIVE DYE INJECTION Left 01/22/2021    Performed by Massie Kluver, MD at [facility]   ? INJECTION RADIOACTIVE TRACER FOR SENTINEL NODE IDENTIFICATION Left 01/22/2021    Performed by Massie Kluver, MD at [facility]   ? LEFT AXILLARY SENTINEL LYMPH NODE BIOPSY Left 01/22/2021    Performed by Massie Kluver, MD at [facility]   ? BILATERAL CHEST FLAT CLOSURE Bilateral 01/22/2021    Performed by Erlene Quan, Zachary George, MD at [facility]   ? BILATERAL CHEST FLAT CLOSURE x 8 Bilateral 01/22/2021    Performed by Stevenson Clinch, MD at [facility]   ? EXCISION BENIGN LESION 0.5 CM OR LESS - TORSO Right 01/22/2021    Performed by Erlene Quan, Zachary George, MD at [facility]   ? TUNNELED VENOUS PORT PLACEMENT Right 02/19/2021   ? Placement of port-a-cath - 36F Right 02/19/2021 Performed by Freund, Alecia Lemming., MD at [facility] OR   ? FLUOROSCOPIC GUIDANCE CENTRAL VENOUS ACCESS DEVICE PLACEMENT/ REPLACEMENT/ REMOVAL N/A 02/19/2021    Performed by Flo Shanks, Alecia Lemming., MD at [facility] OR   ? Left Completion Axillary Lymph Node Dissection Left 05/14/2021    Performed by Massie Kluver, MD at [facility]   ? REMOVAL TUNNELED CENTRAL VENOUS ACCESS DEVICE INCLUDING PORT/ PUMP Right 05/14/2021    Performed by Massie Kluver, MD at [facility]   ? COLONOSCOPY DIAGNOSTIC WITH SPECIMEN COLLECTION BY BRUSHING/ WASHING - FLEXIBLE N/A 10/14/2021    Performed by Benetta Spar, MD at [facility] OR   ? KNEE SURGERY  L ACL as above   ? THYROIDECTOMY       Family History   Problem Relation Age of Onset   ? Arthritis Paternal Grandmother         (osteoarthritis) multiple heberdens noduls and deformities   ? Back pain Paternal Grandmother    ? Arthritis Mother         wear and tear   ? Back pain Mother    ? Hypertension Mother    ? Joint Pain Mother    ? Neck Pain Mother    ? Cancer-Breast Mother 85        was on HRT for 10-15 years   ? Basal Cell Carcinoma Brother    ? Back pain Brother    ? Hypertension Brother    ? Basal Cell Carcinoma Brother    ? Back pain Brother         has had surgery   ? Hypertension Brother    ? Basal Cell Carcinoma Brother    ? Back pain Brother    ? Hypertension Brother    ? Diabetes Father         Type 2   ? Heart problem Father         CABG 3   ? Heart Disease Father    ? Hypertension Father    ? Diabetes Maternal Grandfather         Type 2   ? Birth Defect Daughter         PRS, congenital diaphragmatic hernia, malrotation, grey matter heterotopia   ?  Stroke Maternal Uncle    ? Thyroid Disease Maternal Uncle    ? Diabetes Paternal Grandfather    ? Birth Defect Nephew         craniosynostosis   ? Cancer-Breast Maternal Great-Aunt 1     Social History     Socioeconomic History   ? Marital status: Married   Occupational History   ? Occupation: retired Engineer, civil (consulting)   Tobacco Use   ? Smoking status: Never     Passive exposure: Past   ? Smokeless tobacco: Never   Vaping Use   ? Vaping Use: Never used   Substance and Sexual Activity   ? Alcohol use: Never     Comment: one per month beer   ? Drug use: Never     Comment: in late teens, early twenties, a few times and not since   ? Sexual activity: Yes     Partners: Male     Birth control/protection: Post-menopausal, None     Vaping/E-liquid Use   ? Vaping Use Never User                      Review of Systems   Constitutional: Negative.  Negative for chills, fever and unexpected weight change.   HENT: Negative.    Eyes: Negative.    Respiratory: Negative.    Cardiovascular: Negative.    Gastrointestinal: Negative.  Negative for abdominal distention, abdominal pain, anal bleeding, blood in stool, constipation, diarrhea, nausea, rectal pain and vomiting.   Endocrine: Negative.    Genitourinary: Negative.    Musculoskeletal: Negative.    Skin: Negative.  Negative for color change and wound.   Allergic/Immunologic: Negative.    Neurological: Negative.  Negative for weakness and light-headedness.   Hematological: Negative.    Psychiatric/Behavioral: Negative.    All other systems reviewed and are negative.        Objective:         ? abemaciclib (VERZENIO) 100 mg tablet Take one tablet by mouth twice daily.   ? acetaminophen (TYLENOL) 325 mg tablet Take two tablets by mouth every 6 hours. Take scheduled for 3 days after surgery, then as needed. Do not exceed 4,000mg  in a 24 hour period.   ? ascorbic acid (VITAMIN C PO) Take  by mouth.   ? biotin 1 mg cap Take  by mouth.   ? CALCIUM PO Take  by mouth.   ? cholecalciferol (vitamin D3) (VITAMIN D3 PO) Take  by mouth.   ? diclofenac sodium DR (VOLTAREN) 50 mg tablet Take one tablet by mouth as Needed. Take with food.   ? escitalopram oxalate (LEXAPRO) 10 mg tablet Take one tablet by mouth daily.   ? letrozole [facility]) 2.5 mg tablet Take one tablet by mouth daily.   ? levothyroxine (SYNTHROID) 112 mcg tablet Take one tablet by mouth daily 30 minutes before breakfast.   ? lisinopriL (ZESTRIL) 2.5 mg tablet Take one tablet by mouth daily.   ? mupirocin (CENTANY) 2 % topical ointment Apply  topically to affected area twice daily.   ? nifedipine 0.3%/lidocaine 1.5%/white petrolatum(#) Apply a pea sized amount to the anal opening two to four times a day. Do NOT insert into anus.   ? traMADoL (ULTRAM) 50 mg tablet Take one tablet by mouth at bedtime as needed for Pain.   ? vitamins, B complex tab Take one tablet by mouth three times weekly.   ? vitamins, multiple cap Take one capsule by mouth daily.  Vitals:    11/11/21 0810   BP: 104/77   BP Source: Arm, Right Upper   Pulse: 71   Temp: 36.2 ?C (97.1 ?F)   Resp: 16   SpO2: 100%   O2 Device: None (Room air)   TempSrc: Temporal   PainSc: Five   Weight: 67.3 kg (148 lb 6.4 oz)     Body mass index is 26.29 kg/m?Marland Kitchen     Pain Score: Five  Pain Loc: Generalized    Fatigue Scale: 5     Physical Exam  Vitals reviewed.   Constitutional:       General: She is not in acute distress.     Appearance: Normal appearance. She is well-developed.   HENT:      Head: Normocephalic and atraumatic.      Nose: Nose normal.   Eyes:      General: Lids are normal.      Conjunctiva/sclera: Conjunctivae normal.   Pulmonary:      Effort: Pulmonary effort is normal. No respiratory distress.   Musculoskeletal:         General: Normal range of motion.      Cervical back: Normal range of motion.   Neurological:      Mental Status: She is alert and oriented to person, place, and time.   Psychiatric:         Speech: Speech normal.         Behavior: Behavior normal.         Thought Content: Thought content normal.         Judgment: Judgment normal.               Assessment and Plan:  61yo female with anal fissure s/p colonoscopy on 10/14/2021. Doing well without any complaints.    Post-Operative Care:  - Procedure and findings reviewed.    Diarrhea:  - Intermittent imodium is absolutely ok. Will try 1/2tab next time.     Hemorrhoids/Anal Fissure:  - Continued optimized bowel habits.   - Discussed thrombosed hemorrhoid finding on exam at colonoscopy. Likely from bowel prep. No symptoms now.     Plan:  - RTC PRN.     .The patient and family were allowed to ask questions and voice concerns; these were addressed to the best of our ability. They expressed understanding of what was explained to them, and they agreed with the present plan. Patient has the phone numbers for the Cancer Center and was instructed on how to contact us with any questions or concerns.    My collaborating provider on this patient is Dr. Deedra Ehrich MD.    Brunilda Payor. Fredricka Bonine MSN, APRN, AGCNS-BC, AGPCNP-BC  Advanced Practice Provider  Colon and Rectal Surgery  Surgical Oncology  APP for Dr. Kathee Delton, Dr. Daphine Deutscher and Dr. Daiva Nakayama  Pager: [phone]  Available via Amie Critchley and [facility] [employer]

## 2021-11-13 ENCOUNTER — Encounter: Admit: 2021-11-13 | Discharge: 2021-11-13 | Payer: MEDICARE

## 2021-11-13 DIAGNOSIS — C50112 Malignant neoplasm of central portion of left female breast: Secondary | ICD-10-CM

## 2021-11-13 MED ORDER — VERZENIO 100 MG PO TAB
100 mg | ORAL_TABLET | Freq: Two times a day (BID) | ORAL | 0 refills | Status: AC
Start: 2021-11-13 — End: ?
  Filled 2021-11-19: qty 56, 28d supply, fill #1

## 2021-11-13 NOTE — Patient Instructions
Thank you for coming to see us today.   Please call our office or send a message through MyChart if you have any questions or concerns.                                                                                                                 [facility]    [place] [facility]  [address]  [place], [place] [address]      [facility]  [address]  [place], [place] [address]      Dr. Lauren Nye  [phone] (Nurse Camie Klint Lezcano)  [phone] (Fax)      [email or web address]

## 2021-11-16 ENCOUNTER — Encounter: Admit: 2021-11-16 | Discharge: 2021-11-16 | Payer: MEDICARE

## 2021-11-16 DIAGNOSIS — Z5181 Encounter for therapeutic drug level monitoring: Secondary | ICD-10-CM

## 2021-11-16 DIAGNOSIS — B999 Unspecified infectious disease: Secondary | ICD-10-CM

## 2021-11-16 DIAGNOSIS — E039 Hypothyroidism, unspecified: Secondary | ICD-10-CM

## 2021-11-16 DIAGNOSIS — C801 Malignant (primary) neoplasm, unspecified: Secondary | ICD-10-CM

## 2021-11-16 DIAGNOSIS — Z79811 Long term (current) use of aromatase inhibitors: Secondary | ICD-10-CM

## 2021-11-16 DIAGNOSIS — M2550 Pain in unspecified joint: Secondary | ICD-10-CM

## 2021-11-16 DIAGNOSIS — M549 Dorsalgia, unspecified: Secondary | ICD-10-CM

## 2021-11-16 DIAGNOSIS — F32A Depression: Secondary | ICD-10-CM

## 2021-11-16 DIAGNOSIS — E079 Disorder of thyroid, unspecified: Secondary | ICD-10-CM

## 2021-11-16 DIAGNOSIS — C50112 Malignant neoplasm of central portion of left female breast: Secondary | ICD-10-CM

## 2021-11-16 DIAGNOSIS — I10 Essential (primary) hypertension: Secondary | ICD-10-CM

## 2021-11-16 DIAGNOSIS — M4800 Spinal stenosis, site unspecified: Secondary | ICD-10-CM

## 2021-11-16 DIAGNOSIS — M5134 Other intervertebral disc degeneration, thoracic region: Secondary | ICD-10-CM

## 2021-11-16 DIAGNOSIS — F419 Anxiety disorder, unspecified: Secondary | ICD-10-CM

## 2021-11-16 DIAGNOSIS — IMO0002 Ulcer: Secondary | ICD-10-CM

## 2021-11-16 DIAGNOSIS — D539 Nutritional anemia, unspecified: Secondary | ICD-10-CM

## 2021-11-16 DIAGNOSIS — S83209A Unspecified tear of unspecified meniscus, current injury, unspecified knee, initial encounter: Secondary | ICD-10-CM

## 2021-11-16 DIAGNOSIS — R011 Cardiac murmur, unspecified: Secondary | ICD-10-CM

## 2021-11-16 DIAGNOSIS — Z9013 Acquired absence of bilateral breasts and nipples: Secondary | ICD-10-CM

## 2021-11-16 DIAGNOSIS — C73 Malignant neoplasm of thyroid gland: Secondary | ICD-10-CM

## 2021-11-16 DIAGNOSIS — M5030 Other cervical disc degeneration, unspecified cervical region: Secondary | ICD-10-CM

## 2021-11-16 DIAGNOSIS — R519 Generalized headaches: Secondary | ICD-10-CM

## 2021-11-16 DIAGNOSIS — C50919 Malignant neoplasm of unspecified site of unspecified female breast: Secondary | ICD-10-CM

## 2021-11-16 DIAGNOSIS — Z789 Other specified health status: Secondary | ICD-10-CM

## 2021-11-16 DIAGNOSIS — M810 Age-related osteoporosis without current pathological fracture: Secondary | ICD-10-CM

## 2021-11-16 DIAGNOSIS — M5136 Other intervertebral disc degeneration, lumbar region: Secondary | ICD-10-CM

## 2021-11-16 LAB — CBC AND DIFF
ABSOLUTE BASO COUNT: 0 K/UL (ref 0–0.20)
ABSOLUTE EOS COUNT: 0.1 K/UL (ref 0–0.45)
ABSOLUTE LYMPH COUNT: 0.8 K/UL — ABNORMAL LOW (ref 1.0–4.8)
ABSOLUTE MONO COUNT: 0.3 K/UL (ref 0–0.80)
ABSOLUTE NEUTROPHIL: 1.8 K/UL (ref 1.8–7.0)
BASOPHILS %: 1 % (ref 0–2)
HEMOGLOBIN: 12 g/dL (ref 12.0–15.0)
LYMPHOCYTES %: 25 % (ref 24–44)
MCH: 32 pg (ref 26–34)
MCHC: 33 g/dL (ref 32.0–36.0)
MCV: 94 FL (ref 80–100)
MONOCYTES %: 11 % (ref 4–12)
NEUTROPHILS %: 59 % (ref 41–77)
PLATELET COUNT: 156 K/UL (ref 150–400)
RDW: 13 % (ref 11–15)
WBC COUNT: 3.1 K/UL — ABNORMAL LOW (ref 4.5–11.0)

## 2021-11-16 LAB — COMPREHENSIVE METABOLIC PANEL
AST: 20 U/L (ref 7–40)
CREATININE: 1 mg/dL — ABNORMAL HIGH (ref 0.4–1.00)
EGFR: >60 mL/min (ref >60)
GLUCOSE,PANEL: 62 mg/dL — ABNORMAL LOW (ref 70–100)
POTASSIUM: 3.9 MMOL/L (ref 3.5–5.1)
SODIUM: 135 MMOL/L — ABNORMAL LOW (ref 137–147)

## 2021-11-16 MED ORDER — ZOLEDRONIC ACID IVPB
3.5 mg | Freq: Once | INTRAVENOUS | 0 refills | Status: CP
Start: 2021-11-16 — End: ?
  Administered 2021-11-16 (×2): 3.5 mg via INTRAVENOUS

## 2021-11-16 MED ORDER — ZOLEDRONIC ACID IVPB
3.5 mg | Freq: Once | INTRAVENOUS | 0 refills | Status: CN
Start: 2021-11-16 — End: ?

## 2021-11-16 NOTE — Progress Notes
Patient presented to clinic for Zometa infusion. 22G Peripheral IV flushed with positive blood return. Patient's labs okay to treat. Patient tolerated infusion well. Peripheral IV flushed with saline and removed. Patient discharged off unit in stable condition.     zoledronic acid (ZOMETA) 3.5 mg in sodium chloride 0.9% (NS) 104.375 mL IVPB [[id number]] (Order [id number])

## 2021-11-16 NOTE — Progress Notes
Contacted Cindy Randolph to discuss filling their medication: Verzenio.  Left voicemail asking patient to return call to the specialty pharmacy [phone]).    [name]  [phone]

## 2021-11-16 NOTE — Progress Notes
Oral Chemotherapy Pharmacist Cycle 1 Toxicity Check    Summary of Therapy   Yamel Bale continues abemaciclib (in combination with letrozole) for the adjuvant treatment of breast cancer.  Cycle 1 start date was 10/25/21. Ms. Crayton confirms she is taking the medication as prescribed - abemaciclib 100 mg PO BID (will plan to evaluate for dose increase at cycle 4 per Dr. Donneta Romberg).     Adverse Effects and Adherence Assessment   Ms. Cavenaugh is having the following adverse effects: worsening arthralgias, brain fog, intermittent abdominal cramping, mild nausea, and transient diarrhea (associated with high fat meals, improved with limiting high fat meals).   None of the adverse effects were severe enough to interfere with adherence.     The patient's ability to self-administer medication was re-assessed. The patient has the ability to self-administer this medication. . Ms. Maulding reports missing 0 doses since the start of therapy. she was re-educated on importance of adherence.    Dose Appropriateness Assessment  No renal or hepatic dose adjustment is required at this time No dose adjustments based on toxicity are required at this time and treatment will continue until chemotherapy regimen complete.      CBC w/Diff    Lab Results   Component Value Date/Time    WBC 3.1 (L) 11/16/2021 08:29 AM    RBC 3.77 (L) 11/16/2021 08:29 AM    HGB 12.1 11/16/2021 08:29 AM    HCT 35.7 (L) 11/16/2021 08:29 AM    MCV 94.8 11/16/2021 08:29 AM    MCH 32.1 11/16/2021 08:29 AM    MCHC 33.9 11/16/2021 08:29 AM    RDW 13.5 11/16/2021 08:29 AM    PLTCT 156 11/16/2021 08:29 AM    MPV 7.5 11/16/2021 08:29 AM    Lab Results   Component Value Date/Time    NEUT 59 11/16/2021 08:29 AM    ANC 1.80 11/16/2021 08:29 AM    LYMA 25 11/16/2021 08:29 AM    ALC 0.80 (L) 11/16/2021 08:29 AM    MONA 11 11/16/2021 08:29 AM    AMC 0.30 11/16/2021 08:29 AM    EOSA 4 11/16/2021 08:29 AM    AEC 0.10 11/16/2021 08:29 AM    BASA 1 11/16/2021 08:29 AM ABC 0.00 11/16/2021 08:29 AM        Comprehensive Metabolic Profile    Lab Results   Component Value Date/Time    NA 135 (L) 11/16/2021 08:29 AM    K 3.9 11/16/2021 08:29 AM    CL 99 11/16/2021 08:29 AM    CO2 29 11/16/2021 08:29 AM    GAP 7 11/16/2021 08:29 AM    BUN 17 11/16/2021 08:29 AM    CR 1.03 (H) 11/16/2021 08:29 AM    GLU 62 (L) 11/16/2021 08:29 AM    Lab Results   Component Value Date/Time    CA 9.1 11/16/2021 08:29 AM    PO4 3.8 09/14/2021 01:27 PM    ALBUMIN 4.2 11/16/2021 08:29 AM    TOTPROT 6.8 11/16/2021 08:29 AM    ALKPHOS 53 11/16/2021 08:29 AM    AST 20 11/16/2021 08:29 AM    ALT 24 11/16/2021 08:29 AM    TOTBILI 0.4 11/16/2021 08:29 AM            Serum creatinine: 1.03 mg/dL (H) 41/32/44 0102  Estimated creatinine clearance: 53.2 mL/min (A)    Medication Reconciliation and Drug/Food Interaction Assessment  Ms. Onken reports no medication changes since her last medication history. she was instructed to  speak with her healthcare provider and/or the oral chemotherapy pharmacist before starting any new drug, including, but not limited to, prescription, over the counter, natural / herbal products, or vitamins and supplements.     Drug-drug and drug-food interactions were assessed between the patients? specialty medication(s) and her most current medication list. No significant drug-drug interactions were identified.     Allergies   Allergen Reactions   ? Oxycodone NAUSEA ONLY     Prefers tramadol   ? Sudafed [Pseudoephedrine Hcl] PALPITATIONS       Immunization Assessment   Immunization History   Administered Date(s) Administered   ? COVID-19 (PFIZER), mRNA vacc, 30 mcg/0.3 mL (PF) 11/15/2019, 12/06/2019       Vaccine history and recommended vaccinations were reviewed and will be recommended as appropriate. The patient will be reminded about the importance of receiving an annual influenza vaccine as indicated.    Safety Assessment  Risk Evaluation and Mitigation Strategy (REMS)  No REMS is required for this medication.    Reproductive Risk  Ms. Sartini is a 61 y.o. female. As patient is a female not of child-bearing potential, education was not applicable.     Handling and Disposal  Appropriate safe handling and disposal procedures were reviewed with the patient. Ms. Drum was instructed to return any unused or expired oral chemotherapy medication to a designated disposal bin at one of the [facility] locations or to utilize a community drug take back program. Instructed not to flush down the toilet or to crush the medication    Follow-up Plan   Oneshia Ubaldo was encouraged to call the oral chemotherapy pharmacist at [phone] with questions. This medication is considered medium risk per our internal oral chemotherapy risk categorization. This re-assessment has been completed and the next reassessment planned for 3 months.    Allena Napoleon, Jacklynn Bue, BCOP  Oncology Clinical Pharmacist  11/16/2021

## 2021-11-16 NOTE — Progress Notes
Name: Cindy Randolph          MRN: [id number]      DOB: 12/02/1960      AGE: 61 y.o.   DATE OF SERVICE: 11/16/2021    Subjective:             Reason for Visit:  Follow Up      Cindy Randolph is a 61 y.o. female.      Cancer Staging   Malignant neoplasm of left breast in female, estrogen receptor positive (HCC)  Staging form: Breast, AJCC 8th Edition  - Clinical stage from 12/22/2020: Stage IA (cT1c, cN0, cM0, G1, ER+, PR+, HER2-) - Signed by Massie Kluver, MD on 12/22/2020  - Pathologic stage from 02/02/2021: Stage IIIA (pT3, pN3, cM0, G1, ER+, PR+, HER2-) - Signed by Massie Kluver, MD on 05/27/2021      History of Present Illness  Cindy Randolph is a 61 y.o. woman with a diagnosis of left hormone positive breast cancer in July 2022.    She presented for a routine mammogram in July 2022 which showed benign-appearing microcalcifications and an unchanged low-density circumscribed mass in the right upper upper central breast.  There was also a focal asymmetry in the left breast.  She had a diagnostic left mammogram on 12/04/2020 which showed an irregular density at 12:00 in the left breast and on target ultrasound there was an ill-defined area with slightly hypoechoic at 12:00 7 cm from the nipple.  She underwent ultrasound-guided biopsy on 12/10/2020 which showed grade 1 invasive lobular carcinoma ER 91 to 100%, PR 11 to 20%, HER2 1+ (negative by IHC), Ki-67 2 to 5%.  She then had an MRI on 12/26/2020 which showed multiple faint masses in the left breast with non-mass enhancement extending in the upper and inner quadrant.     On 01/22/2021 she underwent bilateral mastectomy with final pathology showing left breast invasive lobular carcinoma, grade 1, multifocal with connections that merged together for a total extent of 7.4 cm, 5 out of 5 lymph nodes were positive for carcinoma.  Markers were ER 96%, PR 10%, HER2 0, Ki-67 3%.  Right breast was benign.    She had staging scans 02/10/2021 which were negative for metastatic disease.  She then received adjuvant chemotherapy with dose dense AC from 02/23/2021 until 04/06/2021.  She was scheduled to start paclitaxel on 04/20/2021 but declined due to concern for neuropathy.     She underwent left axillary lymph node dissection on 05/14/2021 with final pathology showing carcinoma in 6 of 14 lymph nodes with the largest metastatic focus measuring 1 cm.  This is for a total of 11 out of 19 lymph nodes positive for carcinoma.    She completed adjuvant PMRT on 07/01/2021 and then started letrozole.    She reports feeling well overall. She reports some increased weight gain extra squish but is working with a nutritionist. She reports the biggest concern is fatigue. She is still able to do all ADLs by herself and is able to keep up with her toddler grandson, she is just more fatigued than before chemotherapy. She started [facility] 10/25/21 and is tolerating it well. She denies changes to her neuropathy symptoms but increased symptoms of radiculopathy. She has been doing stretches in the morning with some relief in symptoms.             Review of Systems   Constitutional: Positive for fatigue and unexpected weight change. Negative for activity change,  chills, diaphoresis and fever.   HENT: Negative for mouth sores, nosebleeds and sinus pressure.    Eyes: Negative for pain and visual disturbance.   Respiratory: Negative for cough, shortness of breath and wheezing.    Cardiovascular: Negative for chest pain and palpitations.   Gastrointestinal: Negative for abdominal distention, abdominal pain, constipation, diarrhea and vomiting.   Genitourinary: Negative for menstrual problem and vaginal bleeding.   Musculoskeletal: Positive for arthralgias (tolerable ), back pain (stable), myalgias (stable) and neck pain (stable).   Skin: Negative for rash and wound.   Neurological: Positive for weakness (tolerable) and headaches (mild). Negative for dizziness, light-headedness and numbness.   Hematological: Negative for adenopathy. Does not bruise/bleed easily.   Psychiatric/Behavioral: Negative for sleep disturbance. The patient is not nervous/anxious.          Objective:         ? abemaciclib (VERZENIO) 100 mg tablet Take one tablet by mouth twice daily.   ? acetaminophen (TYLENOL) 325 mg tablet Take two tablets by mouth every 6 hours. Take scheduled for 3 days after surgery, then as needed. Do not exceed 4,000mg  in a 24 hour period.   ? biotin 1 mg cap Take  by mouth.   ? CALCIUM PO Take  by mouth.   ? cholecalciferol (vitamin D3) (VITAMIN D3 PO) Take  by mouth.   ? diclofenac sodium DR (VOLTAREN) 50 mg tablet Take one tablet by mouth as Needed. Take with food.   ? escitalopram oxalate (LEXAPRO) 10 mg tablet Take one tablet by mouth daily.   ? letrozole [facility]) 2.5 mg tablet Take one tablet by mouth daily.   ? levothyroxine (SYNTHROID) 112 mcg tablet Take one tablet by mouth daily 30 minutes before breakfast.   ? lisinopriL (ZESTRIL) 2.5 mg tablet Take one tablet by mouth daily.   ? mupirocin (CENTANY) 2 % topical ointment Apply  topically to affected area twice daily.   ? nifedipine 0.3%/lidocaine 1.5%/white petrolatum(#) Apply a pea sized amount to the anal opening two to four times a day. Do NOT insert into anus.   ? traMADoL (ULTRAM) 50 mg tablet Take one tablet by mouth at bedtime as needed for Pain.   ? vitamins, B complex tab Take one tablet by mouth three times weekly.   ? vitamins, multiple cap Take one capsule by mouth daily.     Vitals:    11/16/21 0850   BP: 119/81   BP Source: Arm, Right Upper   Pulse: 70   Temp: 36.2 ?C (97.1 ?F)   Resp: 16   SpO2: 100%   TempSrc: Temporal   PainSc: Four   Weight: 68 kg (150 lb)     Body mass index is 26.57 kg/m?Marland Kitchen     Pain Score: Four  Pain Loc:  (general joint pain)    Fatigue Scale: 4    Pain Addressed:  Current regimen working to control pain. Torn meniscus s/p repair    Patient Evaluated for a Clinical Trial: No treatment clinical trial available for this patient.     [place] Cooperative Oncology Group performance status is 0, Fully active, able to carry on all pre-disease performance without restriction.        Physical Exam  Constitutional:       Appearance: She is well-developed.   HENT:      Head: Normocephalic and atraumatic.   Eyes:      General: No scleral icterus.     Conjunctiva/sclera: Conjunctivae normal.   Cardiovascular:  Rate and Rhythm: Normal rate.   Pulmonary:      Effort: Pulmonary effort is normal. No respiratory distress.      Breath sounds: Normal breath sounds. No wheezing.   Chest:   Breasts:     Right: No inverted nipple, mass, nipple discharge, skin change or tenderness.      Left: Skin change present. No inverted nipple, mass, nipple discharge or tenderness.       Abdominal:      Tenderness: There is no abdominal tenderness.   Musculoskeletal:         General: No tenderness.      Cervical back: Normal range of motion and neck supple.   Lymphadenopathy:      Upper Body:      Right upper body: No supraclavicular or axillary adenopathy.      Left upper body: No supraclavicular or axillary adenopathy.   Skin:     General: Skin is warm and dry.      Findings: No rash.   Neurological:      Mental Status: She is alert and oriented to person, place, and time.      Cranial Nerves: No cranial nerve deficit.      Sensory: No sensory deficit.      Motor: No weakness.   Psychiatric:         Behavior: Behavior normal.         Thought Content: Thought content normal.            CBC w diff    Lab Results   Component Value Date/Time    WBC 3.1 (L) 11/16/2021 08:29 AM    RBC 3.77 (L) 11/16/2021 08:29 AM    HGB 12.1 11/16/2021 08:29 AM    HCT 35.7 (L) 11/16/2021 08:29 AM    MCV 94.8 11/16/2021 08:29 AM    MCH 32.1 11/16/2021 08:29 AM    MCHC 33.9 11/16/2021 08:29 AM    RDW 13.5 11/16/2021 08:29 AM    PLTCT 156 11/16/2021 08:29 AM    MPV 7.5 11/16/2021 08:29 AM    Lab Results   Component Value Date/Time    NEUT 59 11/16/2021 08:29 AM    ANC 1.80 11/16/2021 08:29 AM    LYMA 25 11/16/2021 08:29 AM    ALC 0.80 (L) 11/16/2021 08:29 AM    MONA 11 11/16/2021 08:29 AM    AMC 0.30 11/16/2021 08:29 AM    EOSA 4 11/16/2021 08:29 AM    AEC 0.10 11/16/2021 08:29 AM    BASA 1 11/16/2021 08:29 AM    ABC 0.00 11/16/2021 08:29 AM        Comprehensive Metabolic Profile    Lab Results   Component Value Date/Time    NA 135 (L) 11/16/2021 08:29 AM    K 3.9 11/16/2021 08:29 AM    CL 99 11/16/2021 08:29 AM    CO2 29 11/16/2021 08:29 AM    GAP 7 11/16/2021 08:29 AM    BUN 17 11/16/2021 08:29 AM    CR 1.03 (H) 11/16/2021 08:29 AM    GLU 62 (L) 11/16/2021 08:29 AM    Lab Results   Component Value Date/Time    CA 9.1 11/16/2021 08:29 AM    PO4 3.8 09/14/2021 01:27 PM    ALBUMIN 4.2 11/16/2021 08:29 AM    TOTPROT 6.8 11/16/2021 08:29 AM    ALKPHOS 53 11/16/2021 08:29 AM    AST 20 11/16/2021 08:29 AM    ALT 24 11/16/2021 08:29 AM  TOTBILI 0.4 11/16/2021 08:29 AM                 Assessment and Plan:  Cindy Randolph is a 61 y.o. postmenopausal woman with a history of Stage IIIA (pT3pN3cN0) hormone positive invasive lobular carcinoma.     She completed 4 cycles of adjuvant AC, declined Taxol. She completed radiation 07/01/21.    Long term plan includes at least 5 years of endocrine therapy. She started letrozole 06/2021 and is tolerating it well. In addition recommend adjuvant abemaciclib x2 years. Reviewed side effects including fatigue, diarrhea, neutropenia, etc. Plan to start at 100mg  BID dose and if well tolerated increase to standard 150mg  BID dosing.     Abemacicilb started 10/25/21. Presents for cycle 2 (to start 7/2). Consider dose increase with cycle 4 depending on tolerability.    Will need annual bone density while on an aromatase inhibitor. Also recommend getting 2-3 servings of calcium in diet and taking 2000IU vitamin D daily. Bone density on 04/22/21 with osteoporosis. Recommend adjuvant IV bisphosphonate (zolendonric acid) while on AI in setting of osteoporosis. Started zolendronic acid 11/16/21. Discussed side effects again with patient and provided handout. Per patient request, will extend out Zometa injection to 30 min. Patient request fluids following injection but unable to provide to due time constraints in treatment. Patient will reach out to office is having side effects from Zometa and we can add on fluids to another day.     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    Fatigue; Recommend continuing morning stretches and increasing walks throughout the week to build up stamina.      RTC in 2 months with lab, visit and cycle 4. Labs C2D15 and C3D1 planned.    Cordie Grice Sistrunk, APRN-NP     ATTESTATION    I personally interviewed and examined the patient.  I have reviewed the history, physical, impression and plan outlined by the Nurse Practitioner.    The patient presents with (HPI) 28 F with Stage IIIA ER+ breast cancer s/p chemotherapy [facility]), radiation and started on letrozole in 06/2021. Now on adjuvant abemaciclib. Notes some fatigue.   On examination there is well appearing female, breathing comfortably on room air, in no acute distress, no edema, no focal neuro deficits.   My impression is 89 F with ER+ breast cancer on adjuvant abemaciclib.   My plan is    - continue abemaciclib.   - plan to reconsider increasing to full dose after re-evaluation at cycle 4.   - discussed anticipated recovery from chemotherapy in regards to cardio-pulmonary fitness can take over a year and expect slow recovery with need to slowly rebuild stamina      Staff name:  Odie Sera, MD Date: 11/16/2021                      Joni Reining, MD  Breast Medical Oncology  Associate Professor of Internal Medicine, Division of Medical Oncology  [facility]

## 2021-11-18 ENCOUNTER — Encounter: Admit: 2021-11-18 | Discharge: 2021-11-18 | Payer: MEDICARE

## 2021-11-18 DIAGNOSIS — C50912 Malignant neoplasm of unspecified site of left female breast: Secondary | ICD-10-CM

## 2021-11-19 ENCOUNTER — Encounter: Admit: 2021-11-19 | Discharge: 2021-11-19 | Payer: MEDICARE

## 2021-11-25 ENCOUNTER — Encounter: Admit: 2021-11-25 | Discharge: 2021-11-25 | Payer: MEDICARE

## 2021-11-27 ENCOUNTER — Encounter: Admit: 2021-11-27 | Discharge: 2021-11-27 | Payer: MEDICARE

## 2021-11-30 ENCOUNTER — Encounter: Admit: 2021-11-30 | Discharge: 2021-11-30 | Payer: MEDICARE

## 2021-11-30 DIAGNOSIS — M549 Dorsalgia, unspecified: Secondary | ICD-10-CM

## 2021-11-30 DIAGNOSIS — M5136 Other intervertebral disc degeneration, lumbar region: Secondary | ICD-10-CM

## 2021-11-30 DIAGNOSIS — F32A Depression: Secondary | ICD-10-CM

## 2021-11-30 DIAGNOSIS — M5030 Other cervical disc degeneration, unspecified cervical region: Secondary | ICD-10-CM

## 2021-11-30 DIAGNOSIS — Z789 Other specified health status: Secondary | ICD-10-CM

## 2021-11-30 DIAGNOSIS — R011 Cardiac murmur, unspecified: Secondary | ICD-10-CM

## 2021-11-30 DIAGNOSIS — C73 Malignant neoplasm of thyroid gland: Secondary | ICD-10-CM

## 2021-11-30 DIAGNOSIS — C50112 Malignant neoplasm of central portion of left female breast: Secondary | ICD-10-CM

## 2021-11-30 DIAGNOSIS — I890 Lymphedema, not elsewhere classified: Secondary | ICD-10-CM

## 2021-11-30 DIAGNOSIS — R519 Generalized headaches: Secondary | ICD-10-CM

## 2021-11-30 DIAGNOSIS — E039 Hypothyroidism, unspecified: Secondary | ICD-10-CM

## 2021-11-30 DIAGNOSIS — D539 Nutritional anemia, unspecified: Secondary | ICD-10-CM

## 2021-11-30 DIAGNOSIS — IMO0002 Ulcer: Secondary | ICD-10-CM

## 2021-11-30 DIAGNOSIS — I10 Essential (primary) hypertension: Secondary | ICD-10-CM

## 2021-11-30 DIAGNOSIS — M4800 Spinal stenosis, site unspecified: Secondary | ICD-10-CM

## 2021-11-30 DIAGNOSIS — C801 Malignant (primary) neoplasm, unspecified: Secondary | ICD-10-CM

## 2021-11-30 DIAGNOSIS — F419 Anxiety disorder, unspecified: Secondary | ICD-10-CM

## 2021-11-30 DIAGNOSIS — E079 Disorder of thyroid, unspecified: Secondary | ICD-10-CM

## 2021-11-30 DIAGNOSIS — S83209A Unspecified tear of unspecified meniscus, current injury, unspecified knee, initial encounter: Secondary | ICD-10-CM

## 2021-11-30 DIAGNOSIS — C50919 Malignant neoplasm of unspecified site of unspecified female breast: Secondary | ICD-10-CM

## 2021-11-30 DIAGNOSIS — M2550 Pain in unspecified joint: Secondary | ICD-10-CM

## 2021-11-30 DIAGNOSIS — Z08 Encounter for follow-up examination after completed treatment for malignant neoplasm: Secondary | ICD-10-CM

## 2021-11-30 DIAGNOSIS — B999 Unspecified infectious disease: Secondary | ICD-10-CM

## 2021-11-30 DIAGNOSIS — M5134 Other intervertebral disc degeneration, thoracic region: Secondary | ICD-10-CM

## 2021-11-30 NOTE — Progress Notes
Name: Cindy Randolph          MRN: [id number]      DOB: 1961/04/11      AGE: 61 y.o.   DATE OF SERVICE: 11/30/2021                Reason for Visit:  Heme/Onc Care      Cindy Randolph is a 61 y.o. female.      Cancer Staging   Malignant neoplasm of left breast in female, estrogen receptor positive (HCC)  Staging form: Breast, AJCC 8th Edition  - Clinical stage from 12/22/2020: Stage IA (cT1c, cN0, cM0, G1, ER+, PR+, HER2-) - Signed by Massie Kluver, MD on 12/22/2020  - Pathologic stage from 02/02/2021: Stage IIIA (pT3, pN3, cM0, G1, ER+, PR+, HER2-) - Signed by Massie Kluver, MD on 05/27/2021    DIAGNOSIS:  Left grade 1 ILC (ER ?96%, PR 10%, HER2?0, Ki-67?3%) at 12:00 dx 11/2020    History of Present Illness    Cindy Randolph returns to the clinic for 6 month follow up of left breast cancer.     HISTORY:  Cindy Randolph is a female who presented to the [facility] on 12/24/2020 at age 78 for left breast cancer.  She reports noticing Left breast intermittent discomfort over the last 2 months.  She had Screening mammogram at [facility] on 11/25/2020 which identified a focal asymmetry in the left breast.  She returned for left diagnostic mammogram and ultrasound on 12/04/2020 which showed an ill-defined hypoechoic area at 12:00 7 cm FTN measuring 1.7 cm.   She underwent Left ultrasound guided biopsy on 12/10/2020 which revealed grade 1 hormone positive, HER2 negative ILC with associated LCIS. She has no breast complaints.  She proceeded with Bilateral total mastectomy/Left SLNB with oncoplastic flat closure on 01/22/2021.  Final surgical pathology revealed multifocal grade 1 ILC that merge to measuring 7.4 cm with associated LCIS, clear margins and 5/5 lymph nodes. The right breast is benign.   Adjuvant chemotherapy and cALND were recommended.  She completed adjuvant chemotherapy of dd AC from 10/3 to 04/06/2021, taxol was recommended but she declined.  She returned for Left completion ALND on 05/14/2021.  Final surgical pathology revealed 6/14 lymph nodes positive. She finished radiation with Dr. Haskel Khan on 07/01/21.    BREAST IMAGING:  Mammogram:    - Screening mammogram 11/25/2020 ([facility]) revealed scattered fibroglandular tissue density.  Benign appearing microcalcification. Unchanged right upper central anterior breast low density circumscribed masses dating back to 2013.  Focal asymmetry located within the left posterior central breast, 12:00, 7.3 cm FTN most conspicuous on MLO.  Recommend spot compression.    - Left diagnostic mammogram 12/04/2020 ([facility]) revealed irregular density at 12:00 left breast persists.  Ultrasound recommended.    Ultrasound:    Left targeted ultrasound 12/04/2020 ([facility]) revealed an ill-defined area which was slightly hypoechoic at 12:00, 7 cm FTN.  This was indeterminate and biopsy was recommended.   MRI:    - Breast MRI 12/26/2020 ([place]) The breast tissue is scattered areas of fibroglandular tissue. ? There is mild background parenchymal enhancement. Left breast: There are multiple faint discontiguous irregular enhancing masses and discontiguous intervening nonmass enhancement involving predominantly the upper and inner left breast from anterior to posterior depth measuring in aggregate 6.3 cm AP by 3.6 cm transverse by 6.4 cm craniocaudal. Several areas demonstrate persistent enhancement kinetics. The artifact from tissue marker clips (ribbon and heart) at the site of biopsy demonstrating  malignancy are seen within the upper left breast at the 12:00 position, middle depth (image 80). A representative discontiguous irregular enhancing mass is seen within the inner left breast at the 9:00 position, middle depth as on image 111, which demonstrates a mammographic correlate on outside CC tomosynthesis slice 18. Anterior extent of abnormal enhancement extends to the base of the left nipple. Posterior extent of abnormal enhancement is approximately 2.1 cm anterior to the underlying pectoralis muscle. There are greater than 5 small round morphologically abnormal and asymmetric level 1 left axillary lymph nodes. No suspicious level 2 or level 3 lymph nodes are seen. No suspicious internal mammary lymph nodes are seen. Right breast: No suspicious mass or nonmass enhancement is seen within the right breast. No suspicious right axillary or internal mammary lymph nodes are identified. Incidental Findings: None     REPRODUCTIVE HEALTH:  Age at first Menarche:  27  Age at First Live Birth:  49  Age at Menopause:  68  Gravida:  8  Para: 8  Breastfeeding:  yes    PROCEDURE:  Bilateral Total mastectomy/Left SLNB oncoplastic flat closure 01/22/2021 ([facility])  2. Left ALND 05/14/2021 [facility])  PATHOLOGY: multifocal grade 1 ILC that merge to measuring 7.4 cm with associated LCIS, clear margins and 5/5 lymph nodes. The right breast is benign.   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  No family history of ovarian or prostate cancer.   MEDICAL ONCOLOGY:    Dr. Neil Crouch Adjuvant chemotherapy:  ddACx 4 completed 04/06/2021, Taxol recommended but declined; Letrozole/Verzenio  REFERRED BY:  Dr. Erskine Emery       Review of Systems      Objective:         ? abemaciclib (VERZENIO) 100 mg tablet Take one tablet by mouth twice daily.   ? acetaminophen (TYLENOL) 325 mg tablet Take two tablets by mouth every 6 hours. Take scheduled for 3 days after surgery, then as needed. Do not exceed 4,000mg  in a 24 hour period.   ? biotin 1 mg cap Take  by mouth.   ? CALCIUM PO Take  by mouth.   ? cholecalciferol (vitamin D3) (VITAMIN D3 PO) Take  by mouth.   ? diclofenac sodium DR (VOLTAREN) 50 mg tablet Take one tablet by mouth as Needed. Take with food.   ? escitalopram oxalate (LEXAPRO) 10 mg tablet Take one tablet by mouth daily.   ? letrozole [facility]) 2.5 mg tablet Take one tablet by mouth daily.   ? levothyroxine (SYNTHROID) 112 mcg tablet Take one tablet by mouth daily 30 minutes before breakfast.   ? lisinopriL (ZESTRIL) 2.5 mg tablet Take one tablet by mouth daily.   ? mupirocin (CENTANY) 2 % topical ointment Apply  topically to affected area twice daily.   ? nifedipine 0.3%/lidocaine 1.5%/white petrolatum(#) Apply a pea sized amount to the anal opening two to four times a day. Do NOT insert into anus.   ? traMADoL (ULTRAM) 50 mg tablet Take one tablet by mouth at bedtime as needed for Pain.   ? vitamins, B complex tab Take one tablet by mouth three times weekly.   ? vitamins, multiple cap Take one capsule by mouth daily.     Vitals:    11/30/21 1604   BP: 127/86   BP Source: Arm, Right Upper   Pulse: 70   Temp: 36.8 ?C (98.2 ?F)   SpO2: 98%   TempSrc: Temporal   PainSc:  Zero   Weight: 68 kg (150 lb)     Body mass index is 26.38 kg/m?Marland Kitchen     Pain Score: Zero            Pain Addressed:  N/A    Patient Evaluated for a Clinical Trial: Patient currently enrolled in a [facility] treatment clinical trial.     [place] Cooperative Oncology Group performance status is 0, Fully active, able to carry on all pre-disease performance without restriction.Marland Kitchen     Physical Exam  Vitals reviewed.   Chest:         RIGHT BREAST EXAM:  Breast:  S/p total mastectomy. No palpable masses  Skin Erythema:  No  Attachment of Overlying Skin:  No  Peau d' orange:  No  Chest Wall Attachment:  No    LEFT BREAST EXAM:  Breast: S/p modified radical mastectomy. No palpable masses. healed scab on lateral incision  Skin Erythema:  No  Attachment of Overlying Skin:  No  Peau d' orange:  No  Chest Wall Attachment: No      RIGHT NODAL BASIN EXAM:  Axillary:  negative  Infraclavicular:  negative  Supraclavicular:  negative    LEFT NODAL BASIN EXAM:  Axillary:  negative  Infraclavicular: negative  Supraclavicular:  negative      Constitutional: No acute distress.  HEENT:  Head: Normocephalic and atraumatic.  Eyes: No discharge. No scleral icterus.  Pulmonary/Chest: No respiratory distress. Neurological: Alert and oriented to person, place and time. No cranial nerve deficit.  Skin: Warm and dry. No rash noted. No erythema. No pallor. left axillary webbing and mild upper arm lymphedema  Psychiatric: Normal mood and affect. Behavior is normal. Judgement and thought content normal.            Assessment and Plan:  Left grade 1 ILC (ER ?96%, PR 10%, HER2?0, Ki-67?3%) at 12:00 dx 11/2020    Cindy Randolph reports she is doing well. She went back for a left ALND in December 2022 with Dr. Leveda Anna. She had a BIS today which was elevated. She had been doing some gardening without her sleeve last week. She does have left axillary cording on exam. I would like her to start wearing her compression sleeve during the day tomorrow. We also discussed stretching exercises that can help with cording. The lymphedema nurse will follow up with her tomorrow via phone. Cindy Randolph continues follow up with Dr. Neil Crouch for medical oncology. She is taking Verzenio and letrozole and is tolerating. She has also started prolia for osteopenia. She has no new chest complaints. She does have a scab on the left lateral incision in an area that folds that she reports heals and then scabs again. I would like her to start using antibiotic ointment x 5 days to see if that helps. She will follow up in 1 year for clinical exam. She and her husband were given ample time to ask questions all of which were answered to their satisfaction. She was given direct contact information and encouraged to call or utilize MyChart with any interval questions or concerns.    1. Continue follow up with Dr. Neil Crouch  2. Start using compression sleeve during day  3. Axillary webbing exercises provided  4. Continue follow up with Lymphedema Clinic  5. RTC in 1 year    Guy Begin, [place]

## 2021-11-30 NOTE — Progress Notes
Bioimpedance Spectroscopy performed.  Advised patient that additional information will be sent via Mychart (preferred) or phone if indicated by the lymphedema nurse within 24 hours.

## 2021-12-01 ENCOUNTER — Encounter: Admit: 2021-12-01 | Discharge: 2021-12-01 | Payer: MEDICARE

## 2021-12-01 NOTE — Telephone Encounter
Cindy Randolph returned Campbell, RN's call regarding her elevated BIS from yesterday and left a voicemail. I returned Cindy Randolph's call and we discussed her BIS results. She stated that she was performing yard work on Sunday and Monday. She was not wearing her sleeve during these activities. She reported that she received a bug bite in her left axilla as well. She is currently experiencing aching, heaviness, and fullness in her left arm. Denies redness or increased warmth of the skin. Cindy Randolph reported that she wore her compression sleeve for about an hour today while she finished up her yard work. She stated that the sleeve felt very tight at the wrist and upper cuff and her hand became puffy and bluish so she removed the sleeve. I discussed with Cindy Randolph that her sleeve does sound too tight and she should not continue to wear it. I recommended for her to elevate her arm. Cindy Randolph stated that she has performed MLD before, I encouraged that intervention as well. Cindy Randolph reported experiening cording symptoms in her left arm which was noted on her exam with Cindy Sauer, PA yesterday. We discussed a follow up visit this week. She stated that she would prefer to be seen tomorrow afternoon. I scheduled a visit with Cindy Harvest, RN at 1300 at [facility]. Cindy Randolph verbalized understanding of the new appt details.      Breast Surgery History:   01/22/21 - Bilateral mastectomy, left SLNB (5/5), with Dr. Leveda Anna. Closure by Dr. Dayton Bailiff   05/14/21 - Completion left ALND (6/14) with Dr. Leveda Anna.     Chemo: Adjuvant chemotherapy: ddACx 4 completed 04/06/2021, Taxol recommended but declined; Letrozole/Verzenio  Radiation: 06/10/21 - 07/01/21

## 2021-12-02 ENCOUNTER — Encounter: Admit: 2021-12-02 | Discharge: 2021-12-02 | Payer: MEDICARE

## 2021-12-02 DIAGNOSIS — I890 Lymphedema, not elsewhere classified: Secondary | ICD-10-CM

## 2021-12-02 DIAGNOSIS — Z9189 Other specified personal risk factors, not elsewhere classified: Secondary | ICD-10-CM

## 2021-12-07 ENCOUNTER — Encounter: Admit: 2021-12-07 | Discharge: 2021-12-07 | Payer: MEDICARE

## 2021-12-08 ENCOUNTER — Encounter: Admit: 2021-12-08 | Discharge: 2021-12-08 | Payer: MEDICARE

## 2021-12-11 ENCOUNTER — Encounter: Admit: 2021-12-11 | Discharge: 2021-12-11 | Payer: MEDICARE

## 2021-12-11 DIAGNOSIS — C50112 Malignant neoplasm of central portion of left female breast: Secondary | ICD-10-CM

## 2021-12-11 MED ORDER — VERZENIO 100 MG PO TAB
100 mg | ORAL_TABLET | Freq: Two times a day (BID) | ORAL | 0 refills
Start: 2021-12-11 — End: ?

## 2021-12-14 ENCOUNTER — Encounter: Admit: 2021-12-14 | Discharge: 2021-12-14 | Payer: MEDICARE

## 2021-12-21 ENCOUNTER — Encounter: Admit: 2021-12-21 | Discharge: 2021-12-21 | Payer: MEDICARE

## 2021-12-21 DIAGNOSIS — R053 Persistent dry cough: Secondary | ICD-10-CM

## 2021-12-21 DIAGNOSIS — C50112 Malignant neoplasm of central portion of left female breast: Secondary | ICD-10-CM

## 2021-12-23 ENCOUNTER — Encounter: Admit: 2021-12-23 | Discharge: 2021-12-23 | Payer: MEDICARE

## 2021-12-23 DIAGNOSIS — C50112 Malignant neoplasm of central portion of left female breast: Secondary | ICD-10-CM

## 2021-12-23 DIAGNOSIS — R053 Persistent dry cough: Secondary | ICD-10-CM

## 2021-12-28 ENCOUNTER — Encounter: Admit: 2021-12-28 | Discharge: 2021-12-28 | Payer: MEDICARE

## 2021-12-28 DIAGNOSIS — R918 Other nonspecific abnormal finding of lung field: Secondary | ICD-10-CM

## 2021-12-28 DIAGNOSIS — R053 Persistent dry cough: Secondary | ICD-10-CM

## 2021-12-30 ENCOUNTER — Encounter: Admit: 2021-12-30 | Discharge: 2021-12-30 | Payer: MEDICARE

## 2022-01-01 ENCOUNTER — Encounter: Admit: 2022-01-01 | Discharge: 2022-01-01 | Payer: MEDICARE

## 2022-01-03 ENCOUNTER — Encounter: Admit: 2022-01-03 | Discharge: 2022-01-03 | Payer: MEDICARE

## 2022-01-03 DIAGNOSIS — C50112 Malignant neoplasm of central portion of left female breast: Secondary | ICD-10-CM

## 2022-01-03 DIAGNOSIS — M810 Age-related osteoporosis without current pathological fracture: Secondary | ICD-10-CM

## 2022-01-05 ENCOUNTER — Encounter: Admit: 2022-01-05 | Discharge: 2022-01-05 | Payer: MEDICARE

## 2022-01-05 DIAGNOSIS — M810 Age-related osteoporosis without current pathological fracture: Secondary | ICD-10-CM

## 2022-01-05 DIAGNOSIS — C50112 Malignant neoplasm of central portion of left female breast: Secondary | ICD-10-CM

## 2022-01-06 ENCOUNTER — Encounter: Admit: 2022-01-06 | Discharge: 2022-01-06 | Payer: MEDICARE

## 2022-01-06 NOTE — Telephone Encounter
Left vague message about scheduling labs on 08/21 with scheduling number to call for more info.

## 2022-01-07 ENCOUNTER — Encounter: Admit: 2022-01-07 | Discharge: 2022-01-07 | Payer: MEDICARE

## 2022-01-11 ENCOUNTER — Encounter: Admit: 2022-01-11 | Discharge: 2022-01-11 | Payer: MEDICARE

## 2022-01-11 DIAGNOSIS — B999 Unspecified infectious disease: Secondary | ICD-10-CM

## 2022-01-11 DIAGNOSIS — Z5181 Encounter for therapeutic drug level monitoring: Secondary | ICD-10-CM

## 2022-01-11 DIAGNOSIS — C801 Malignant (primary) neoplasm, unspecified: Secondary | ICD-10-CM

## 2022-01-11 DIAGNOSIS — F419 Anxiety disorder, unspecified: Secondary | ICD-10-CM

## 2022-01-11 DIAGNOSIS — M2550 Pain in unspecified joint: Secondary | ICD-10-CM

## 2022-01-11 DIAGNOSIS — S83209A Unspecified tear of unspecified meniscus, current injury, unspecified knee, initial encounter: Secondary | ICD-10-CM

## 2022-01-11 DIAGNOSIS — Z789 Other specified health status: Secondary | ICD-10-CM

## 2022-01-11 DIAGNOSIS — M5030 Other cervical disc degeneration, unspecified cervical region: Secondary | ICD-10-CM

## 2022-01-11 DIAGNOSIS — Z08 Encounter for follow-up examination after completed treatment for malignant neoplasm: Secondary | ICD-10-CM

## 2022-01-11 DIAGNOSIS — C50919 Malignant neoplasm of unspecified site of unspecified female breast: Secondary | ICD-10-CM

## 2022-01-11 DIAGNOSIS — M4800 Spinal stenosis, site unspecified: Secondary | ICD-10-CM

## 2022-01-11 DIAGNOSIS — Z9013 Acquired absence of bilateral breasts and nipples: Secondary | ICD-10-CM

## 2022-01-11 DIAGNOSIS — F32A Depression: Secondary | ICD-10-CM

## 2022-01-11 DIAGNOSIS — E039 Hypothyroidism, unspecified: Secondary | ICD-10-CM

## 2022-01-11 DIAGNOSIS — M5136 Other intervertebral disc degeneration, lumbar region: Secondary | ICD-10-CM

## 2022-01-11 DIAGNOSIS — M5134 Other intervertebral disc degeneration, thoracic region: Secondary | ICD-10-CM

## 2022-01-11 DIAGNOSIS — D539 Nutritional anemia, unspecified: Secondary | ICD-10-CM

## 2022-01-11 DIAGNOSIS — I10 Essential (primary) hypertension: Secondary | ICD-10-CM

## 2022-01-11 DIAGNOSIS — M549 Dorsalgia, unspecified: Secondary | ICD-10-CM

## 2022-01-11 DIAGNOSIS — Z9189 Other specified personal risk factors, not elsewhere classified: Secondary | ICD-10-CM

## 2022-01-11 DIAGNOSIS — C73 Malignant neoplasm of thyroid gland: Secondary | ICD-10-CM

## 2022-01-11 DIAGNOSIS — C50112 Malignant neoplasm of central portion of left female breast: Secondary | ICD-10-CM

## 2022-01-11 DIAGNOSIS — IMO0002 Ulcer: Secondary | ICD-10-CM

## 2022-01-11 DIAGNOSIS — E079 Disorder of thyroid, unspecified: Secondary | ICD-10-CM

## 2022-01-11 DIAGNOSIS — R519 Generalized headaches: Secondary | ICD-10-CM

## 2022-01-11 DIAGNOSIS — R011 Cardiac murmur, unspecified: Secondary | ICD-10-CM

## 2022-01-11 LAB — COMPREHENSIVE METABOLIC PANEL
GLUCOSE,PANEL: 91 mg/dL (ref 70–100)
POTASSIUM: 4.2 MMOL/L (ref 3.5–5.1)
SODIUM: 134 MMOL/L — ABNORMAL LOW (ref 137–147)

## 2022-01-11 LAB — PHOSPHORUS: PHOSPHORUS: 3.5 mg/dL (ref 2.0–4.5)

## 2022-01-11 LAB — CBC AND DIFF: WBC COUNT: 4.9 K/UL (ref 4.5–11.0)

## 2022-01-11 LAB — 25-OH VITAMIN D (D2 + D3): VITAMIN D (25-OH) TOTAL: 49 ng/mL (ref 30–80)

## 2022-01-11 MED ORDER — ESCITALOPRAM OXALATE 20 MG PO TAB
20 mg | ORAL_TABLET | Freq: Every day | ORAL | 3 refills | Status: AC
Start: 2022-01-11 — End: ?

## 2022-01-11 MED ORDER — ANASTROZOLE 1 MG PO TAB
1 mg | ORAL_TABLET | Freq: Every day | ORAL | 3 refills | 33.00000 days | Status: AC
Start: 2022-01-11 — End: ?

## 2022-01-11 NOTE — Progress Notes
Name: Chee Sulek          MRN: [id number]      DOB: 11-05-1960      AGE: 61 y.o.   DATE OF SERVICE: 01/11/2022    Subjective:             Reason for Visit:  Follow Up      Mavery Westerlund is a 61 y.o. female.      Cancer Staging   Malignant neoplasm of left breast in female, estrogen receptor positive (HCC)  Staging form: Breast, AJCC 8th Edition  - Clinical stage from 12/22/2020: Stage IA (cT1c, cN0, cM0, G1, ER+, PR+, HER2-) - Signed by Massie Kluver, MD on 12/22/2020  - Pathologic stage from 02/02/2021: Stage IIIA (pT3, pN3, cM0, G1, ER+, PR+, HER2-) - Signed by Massie Kluver, MD on 05/27/2021      History of Present Illness  Mistey Fimbres is a 61 y.o. woman with a diagnosis of left hormone positive breast cancer in July 2022.    She presented for a routine mammogram in July 2022 which showed benign-appearing microcalcifications and an unchanged low-density circumscribed mass in the right upper upper central breast.  There was also a focal asymmetry in the left breast.  She had a diagnostic left mammogram on 12/04/2020 which showed an irregular density at 12:00 in the left breast and on target ultrasound there was an ill-defined area with slightly hypoechoic at 12:00 7 cm from the nipple.  She underwent ultrasound-guided biopsy on 12/10/2020 which showed grade 1 invasive lobular carcinoma ER 91 to 100%, PR 11 to 20%, HER2 1+ (negative by IHC), Ki-67 2 to 5%.  She then had an MRI on 12/26/2020 which showed multiple faint masses in the left breast with non-mass enhancement extending in the upper and inner quadrant.     On 01/22/2021 she underwent bilateral mastectomy with final pathology showing left breast invasive lobular carcinoma, grade 1, multifocal with connections that merged together for a total extent of 7.4 cm, 5 out of 5 lymph nodes were positive for carcinoma.  Markers were ER 96%, PR 10%, HER2 0, Ki-67 3%.  Right breast was benign.    She had staging scans 02/10/2021 which were negative for metastatic disease.  She then received adjuvant chemotherapy with dose dense AC from 02/23/2021 until 04/06/2021.  She was scheduled to start paclitaxel on 04/20/2021 but declined due to concern for neuropathy.     She underwent left axillary lymph node dissection on 05/14/2021 with final pathology showing carcinoma in 6 of 14 lymph nodes with the largest metastatic focus measuring 1 cm.  This is for a total of 11 out of 19 lymph nodes positive for carcinoma.    She completed adjuvant PMRT on 07/01/2021 and then started letrozole.      Ms. Beasley is here for follow up. She denies any breast concerns today. She took a drug holiday from [facility] and is feeling better in regards to her fatigue. She did not restart the Abemaciclib after two weeks as directed. She reports ongoing joint aches in her hands that is limiting her quality of life. She feels like this side effect is from her AI and would like to switch to another AI. She feels like her anxiety is well controlled, but she has been sad lately. She does not want to take the Abemaciclib right now, she is hoping to get relief from her chest discomfort first before restarting the Abemaciclib.  Review of Systems   Constitutional: Positive for fatigue and unexpected weight change. Negative for activity change, chills, diaphoresis and fever.   HENT: Negative for mouth sores, nosebleeds and sinus pressure.    Eyes: Negative for pain and visual disturbance.   Respiratory: Negative for cough, shortness of breath and wheezing.    Cardiovascular: Negative for chest pain and palpitations.   Gastrointestinal: Negative for abdominal distention, abdominal pain, constipation, diarrhea and vomiting.   Genitourinary: Negative for menstrual problem and vaginal bleeding.   Musculoskeletal: Positive for arthralgias (tolerable ), back pain (stable), myalgias (stable) and neck pain (stable).   Skin: Negative for rash and wound.   Neurological: Positive for weakness (tolerable) and headaches (mild). Negative for dizziness, light-headedness and numbness.   Hematological: Negative for adenopathy. Does not bruise/bleed easily.   Psychiatric/Behavioral: Negative for sleep disturbance. The patient is not nervous/anxious.          Objective:         ? abemaciclib (VERZENIO) 100 mg tablet Take one tablet by mouth twice daily.   ? acetaminophen (TYLENOL) 325 mg tablet Take two tablets by mouth every 6 hours. Take scheduled for 3 days after surgery, then as needed. Do not exceed 4,000mg  in a 24 hour period.   ? biotin 1 mg cap Take  by mouth.   ? CALCIUM PO Take  by mouth.   ? cholecalciferol (vitamin D3) (VITAMIN D3 PO) Take  by mouth.   ? diclofenac sodium DR (VOLTAREN) 50 mg tablet Take one tablet by mouth as Needed. Take with food.   ? escitalopram oxalate (LEXAPRO) 10 mg tablet Take one tablet by mouth daily.   ? letrozole [facility]) 2.5 mg tablet Take one tablet by mouth daily.   ? lisinopriL (ZESTRIL) 2.5 mg tablet Take one tablet by mouth daily.   ? mupirocin (CENTANY) 2 % topical ointment Apply  topically to affected area twice daily.   ? nifedipine 0.3%/lidocaine 1.5%/white petrolatum(#) Apply a pea sized amount to the anal opening two to four times a day. Do NOT insert into anus.   ? omeprazole DR (PRILOSEC) 40 mg capsule Take one capsule by mouth daily.   ? SYNTHROID 100 mcg tablet Take one tablet by mouth daily 30 minutes before breakfast.   ? traMADoL (ULTRAM) 50 mg tablet Take one tablet by mouth at bedtime as needed for Pain.   ? vitamins, B complex tab Take one tablet by mouth three times weekly.   ? vitamins, multiple cap Take one capsule by mouth daily.     Vitals:    01/11/22 1357   BP: 131/85   BP Source: Arm, Right Upper   Pulse: 71   Temp: 36.4 ?C (97.5 ?F)   Resp: 16   SpO2: 99%   TempSrc: Temporal   PainSc: Five   Weight: 70 kg (154 lb 6.4 oz)     Body mass index is 27.15 kg/m?Marland Kitchen     Pain Score: Five  Pain Loc: Generalized    Fatigue Scale: 4    Pain Addressed:  Current regimen working to control pain. Torn meniscus s/p repair    Patient Evaluated for a Clinical Trial: No treatment clinical trial available for this patient.     [place] Cooperative Oncology Group performance status is 0, Fully active, able to carry on all pre-disease performance without restriction.        Physical Exam  Constitutional:       Appearance: She is well-developed.   HENT:  Head: Normocephalic and atraumatic.   Eyes:      General: No scleral icterus.     Conjunctiva/sclera: Conjunctivae normal.   Cardiovascular:      Rate and Rhythm: Normal rate.   Pulmonary:      Effort: Pulmonary effort is normal. No respiratory distress.   Chest:   Breasts:     Right: No mass, skin change or tenderness.      Left: No mass, skin change or tenderness.       Abdominal:      Tenderness: There is no abdominal tenderness.   Musculoskeletal:         General: No tenderness.      Cervical back: Normal range of motion and neck supple.   Lymphadenopathy:      Upper Body:      Right upper body: No supraclavicular or axillary adenopathy.      Left upper body: No supraclavicular or axillary adenopathy.   Skin:     General: Skin is warm and dry.      Findings: No rash.   Neurological:      Mental Status: She is alert and oriented to person, place, and time.      Sensory: No sensory deficit.      Motor: No weakness.   Psychiatric:         Behavior: Behavior normal.         Thought Content: Thought content normal.            CBC w diff    Lab Results   Component Value Date/Time    WBC 4.9 01/11/2022 01:13 PM    RBC 3.80 (L) 01/11/2022 01:13 PM    HGB 12.1 01/11/2022 01:13 PM    HCT 36.0 01/11/2022 01:13 PM    MCV 94.7 01/11/2022 01:13 PM    MCH 31.7 01/11/2022 01:13 PM    MCHC 33.5 01/11/2022 01:13 PM    RDW 15.2 (H) 01/11/2022 01:13 PM    PLTCT 190 01/11/2022 01:13 PM    MPV 8.3 01/11/2022 01:13 PM    Lab Results   Component Value Date/Time    NEUT 58 01/11/2022 01:13 PM    ANC 2.90 01/11/2022 01:13 PM    LYMA 21 (L) 01/11/2022 01:13 PM    ALC 1.00 01/11/2022 01:13 PM    MONA 15 (H) 01/11/2022 01:13 PM    AMC 0.70 01/11/2022 01:13 PM    EOSA 5 01/11/2022 01:13 PM    AEC 0.20 01/11/2022 01:13 PM    BASA 1 01/11/2022 01:13 PM    ABC 0.00 01/11/2022 01:13 PM        Comprehensive Metabolic Profile    Lab Results   Component Value Date/Time    NA 134 (L) 01/11/2022 01:13 PM    K 4.2 01/11/2022 01:13 PM    CL 100 01/11/2022 01:13 PM    CO2 29 01/11/2022 01:13 PM    GAP 5 01/11/2022 01:13 PM    BUN 15 01/11/2022 01:13 PM    CR 0.78 01/11/2022 01:13 PM    GLU 91 01/11/2022 01:13 PM    Lab Results   Component Value Date/Time    CA 9.2 01/11/2022 01:13 PM    PO4 3.5 01/11/2022 01:13 PM    ALBUMIN 4.4 01/11/2022 01:13 PM    TOTPROT 7.1 01/11/2022 01:13 PM    ALKPHOS 46 01/11/2022 01:13 PM    AST 18 01/11/2022 01:13 PM    ALT 17 01/11/2022 01:13 PM  TOTBILI 0.3 01/11/2022 01:13 PM                 Assessment and Plan:  Nachole Hebron is a 61 y.o. postmenopausal woman with a history of Stage IIIA (pT3pN3cN0) hormone positive invasive lobular carcinoma.     She completed 4 cycles of adjuvant AC, declined Taxol. She completed radiation 07/01/21.    Long term plan includes at least 5 years of endocrine therapy. She started letrozole 06/2021. In addition recommend adjuvant abemaciclib x2 years. Reviewed side effects including fatigue, diarrhea, neutropenia, etc. Plan to start at 100mg  BID dose and if well tolerated increase to standard 150mg  BID dosing. Patient is not tolerating Letrozole (joint arthralgia) and is requesting a new AI. Will prescribe Anastrozole. If hot flashes increase with changing AI, could consider prescribing Gabapentin 300 mg QHS.     Abemacicilb started 10/25/21. Patient not tolerating 100 mg dose. Discussed decreasing dose to 50 mg. Recommended a 2 week holiday from drug and then restart at 50 mg. Patient has not restarted this medication due to side effects. She would like to wait 4 weeks to recover from the radiation effects to lungs. Patient has been instructed to call/message our office after 4 weeks to discuss restarting medication at 50mg  dose.    Will need annual bone density while on an aromatase inhibitor. Also recommend getting 2-3 servings of calcium in diet and taking 2000IU vitamin D daily. Bone density on 04/22/21 with osteoporosis. Recommend adjuvant IV bisphosphonate (zolendonric acid) while on AI in setting of osteoporosis. Started zolendronic acid 11/16/21. Discussed side effects again with patient and provided handout. Per patient request, will extend out Zometa injection to 30 min.   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    Fatigue; Recommend continuing morning stretches and increasing walks throughout the week to build up stamina.      Anxiety/depression. Discussed increasing Lexapro from 10 mg to 20 mg. Patient is agreeable to this plan.     RTC in 2 months with lab; can arrange labs sooner if ready to start abemacicilb.    Cordie Grice Sistrunk, APRN-NP     ATTESTATION    I personally interviewed and examined the patient.  I have reviewed the history, physical, impression and plan outlined by the Nurse Practitioner.    The patient presents with (HPI) 72 F with Stage IIIA ER+ breast cancer s/p chemotherapy [facility]), radiation and started on letrozole in 06/2021. Now on adjuvant abemaciclib. Notes some fatigue still and chest discomfort at times.  On examination there is well appearing female, breathing comfortably on room air, in no acute distress, no edema, no focal neuro deficits. Chest exam with no masses or skin changes. No axillary or supraclavicular adenopathy.  My impression is 9 F with ER+ breast cancer on adjuvant abemaciclib.   My plan is    - change to anastrozole   - increase lexapro to 20mg  daily   - consider revisiting pneumonitis and chest discomfort and consideration of steroids if persists   - reach out in 4 weeks with decision on restarting abemaciclib   - discussed anticipated recovery from chemotherapy in regards to cardio-pulmonary fitness can take over a year and expect slow recovery with need to slowly rebuild stamina      Staff name:  Joni Reining MD Date: 01/11/2022                      Leotis Shames  Donneta Romberg, MD  Breast Medical Oncology  Associate Professor of Internal Medicine, Division of Medical Oncology  [facility]

## 2022-01-11 NOTE — Progress Notes
Bioimpedance Spectroscopy performed. Advised patient that Normal result will be sent within 24 hours by mail or via Mychart (preferred). The patient will be contacted via phone by the lymphedema nurse with any abnormal results.

## 2022-01-11 NOTE — Progress Notes
Lymphedema Prevention Bioimpedance Spectroscopy (BIS) Monitoring    Notified patient via Phone result was elevated.     Reviewed BIS testing results from today.  Results:   LEFT  Current: 12.7  Baseline: 2.0  Change from Baseline: 10.7    Elevated, greater than 3 standard deviation increase from baseline.      Called Cindy Randolph to discuss her BIS reading from today. She stated that her left arm does feel a little tight and full. Denied skin redness or visible swelling. She has not been wearing he sleeve much lately because she hasn't been very active. We discussed that the heat can cause vasodilation and increase the workload for her left arm. I recommended for Cindy Randolph to either wear her sleeve during daytime hours for the next week or wear her sleeve in the evening for a couple of hours before bed. She should continue to wear the sleeve with activities. She can perform MLD as well. We will check in next Monday to see how she is doing. She will contact the clinic if she has any concerns before next Monday. She did not have any other questions or concerns at this time.

## 2022-01-18 ENCOUNTER — Encounter: Admit: 2022-01-18 | Discharge: 2022-01-18 | Payer: MEDICARE

## 2022-01-18 NOTE — Telephone Encounter
I called Cindy Randolph to check on her lymphedema symptoms. Cindy Randolph completed a follow up BIS on 8/21 after she had an elevated reading on 7/10. Her BIS on 8/21 was 10.7 change from baseline. Cindy Randolph reported experiencing left arm tightness and fullness. For the last week she has been wearing her compression sleeve for a couple of hours during the evening and performing self MLD twice daily. Cindy Randolph reported that her arm is feeling better but she is still experiencing mild fullness in her left arm. She reported experiencing tenderness while performing her MLD but not in the same area every day. She stated that she has experienced the tenderness at different areas in her left arm and on her lateral chest wall. She denies skin redness or increased warmth in the area. She stated that she watches her grandson in the mornings and will lift/carry him on her left side. Discussed using her compression sleeve during daytime hours for the next week and continuing to perform self MLD twice daily. We will check in next Tuesday, Monday is a holiday, to see if her symptoms have improved. If not, we will schedule a clinic visit. [name] verbalized understanding and did not have any other questions at this time.

## 2022-01-21 ENCOUNTER — Encounter: Admit: 2022-01-21 | Discharge: 2022-01-21 | Payer: MEDICARE

## 2022-01-21 DIAGNOSIS — C50112 Malignant neoplasm of central portion of left female breast: Secondary | ICD-10-CM

## 2022-01-21 MED ORDER — ABEMACICLIB 50 MG PO TAB
50 mg | ORAL_TABLET | Freq: Every day | ORAL | 0 refills | Status: DC
Start: 2022-01-21 — End: 2022-01-21
  Filled 2022-01-22: qty 56, 28d supply, fill #1

## 2022-01-21 MED ORDER — VERZENIO 50 MG PO TAB
50 mg | ORAL_TABLET | Freq: Two times a day (BID) | ORAL | 0 refills | Status: CN
Start: 2022-01-21 — End: ?

## 2022-01-21 NOTE — Progress Notes
Contacted [facility]ri[facility] Dudzik to discuss filling their medication: Verzenio.  Left voicemail asking patient to return call to the specialty pharmacy [phone]).      [facility] [place]  [phone]

## 2022-01-22 ENCOUNTER — Encounter: Admit: 2022-01-22 | Discharge: 2022-01-22 | Payer: MEDICARE

## 2022-01-26 ENCOUNTER — Encounter: Admit: 2022-01-26 | Discharge: 2022-01-26 | Payer: MEDICARE

## 2022-01-27 ENCOUNTER — Encounter: Admit: 2022-01-27 | Discharge: 2022-01-27 | Payer: MEDICARE

## 2022-01-28 ENCOUNTER — Encounter: Admit: 2022-01-28 | Discharge: 2022-01-28 | Payer: MEDICARE

## 2022-01-29 ENCOUNTER — Encounter: Admit: 2022-01-29 | Discharge: 2022-01-29 | Payer: MEDICARE

## 2022-02-01 ENCOUNTER — Encounter: Admit: 2022-02-01 | Discharge: 2022-02-01 | Payer: MEDICARE

## 2022-02-01 MED FILL — VERZENIO 50 MG PO TAB: 50 mg | ORAL | 28 days supply | Qty: 56 | Fill #1 | Status: CP

## 2022-02-03 ENCOUNTER — Encounter: Admit: 2022-02-03 | Discharge: 2022-02-03 | Payer: MEDICARE

## 2022-02-03 DIAGNOSIS — C50112 Malignant neoplasm of central portion of left female breast: Secondary | ICD-10-CM

## 2022-02-03 NOTE — Progress Notes
LYMPHEDEMA DATASHEET-FOLLOW-UP    DATE:  02/03/2022    PATIENT NAME:  Cindy Randolph  DATE OF BIRTH:  1960/07/29  MRN:   [id number]    Follow-Up Visit:  Initial visit for left arm lymphedema.   Breast Surgery History: Bilateral total mastectomy/left SLNB (5/5) with oncoplastic flat closure on 01/22/2021, left completion ALND (6/14) on 05/14/2021 with Dr. Leveda Anna.      Lymphedema Diagnosis: Yes: Stage 1 - New onset     Diagnostic Definition: > = 10% change from baseline volume       Risk Factors:  BMI over 30? No  Change in BMI from initial diagnosis? Stable   Recent infections? No  Radiation therapy? Yes, left chest wall + nodes 06/10/21 - 07/01/21  Chemotherapy? Yes, adjuvant ddAC 02/23/21 - 04/06/21, declined Taxol  Use of compression sleeve? Unilateral    Cindy Randolph had an elevated BIS result on 11/30/21. She completed a visit with Bess Harvest, RN on 12/02/21 who recommended a larger sized compression sleeve (Juzo 2 max instead of 2 regular, CCL1). At that visit Cindy Randolph's BIS had returned to normal. She completed a follow up BIS on 8/21 which was again elevated. Cindy Randolph reported experiencing fullness and tightness in her left arm. I recommended for Cindy Randolph to wear her compression sleeve in the evening for a couple of hours and with activities as well as perform MLD. We followed up in 1 week and Cindy Randolph reported that her symptoms had improved but they were still present. I recommended for Cindy Randolph to wear her sleeve during daytime hours and perform MLD twice daily for 1 week. We followed up on 9/5 via telephone. Cindy Randolph stated that she was still symptomatic. She stated that she would like to try the interventions for 1 more week before scheduling a clinic visit. We followed up on 9/11 and Cindy Randolph reported that her symptoms were still present. Follow up clinic visit was scheduled for 9/14.     Cindy Randolph presented to clinic today for evaluation of left arm lymphedema.  Reviewed with patient any general concerns as it relates to lymphedema. Reviewed signs and symptoms of early lymphedema in affected arm. She reported experiencing fullness in her left arm. She stated that she has continued to wear her sleeve during daytime hours and perform MLD twice daily. Slight visible fullness noted left forearm.     Compression Garment Size:  Sleeve: Juzo CCL1 size II max     Bioimpedance Analysis     12/24/20 - Baseline 11/30/21 12/02/21 01/11/22 02/04/22   Current  13.4 10.8 12.7 14.0   Baseline 2.0       Change from Baseline 11.4 8.8 10.7 12.0     Elevated, greater than 3 standard deviations increase from baseline. Improved from 13.4 on 11/30/21.    Hand Dominance:  right handed    Circumferential Measurements completed 02/04/22 Circumferential Measurements completed 12/02/21   RUE/ LUE cm RUE/LUE cm   Hand: 18.7 / 18.3 Hand cm:  19.3 /  18.7    Wrist: 16.6 / 16.9 Wrist cm:  16.8 /  16.4     8 cm: 18.8 / 20.0 8 cm:  18.8 /  19.0   16 cm: 23.5 / 24.8 16 cm:  23.5 /  23.3   Elbow: 25.3 / 25.9 Elbow cm:  24.3 /  25.9   8 cm: 29.2 / 29.6 8 cm:  29.2 /  30.0   16 cm: 32.8 / 33.3 16 cm:  31.1 /  31.4   24  cm: 34.6 / 36.6 24 cm:  33.2 /  33.7           Assessment:  Bilateral upper extremity skin is pink, soft and intact. No rash, erythema or ulcerations noted.   Slight fullness left forearm when compared to right arm. No pitting, stemmer's, or fibrosis noted.   Visualization of vasculature and bony prominences equal in bilateral hands.   ROM: WNL, able to fully extend bilateral upper extremities above head without hesitation or guarding.     Discussed assessment with patient. Reviewed Elevated BIS result and circumferential arm measurements without areas of greater than 2 cm difference between arms.  Early lymphedema signs and symptoms, potential aggravating factors, lymphatic drainage pathways, treatment interventions, and risks of not treating discussed. Discussed and demonstrated left side crawling the wall stretching exercise. Discussed and demonstrated gentle self-massage (MLD) using stretch and pull (stationary circles) technique with good return demonstration. Discussed purpose and importance of compression with appropriate size compression sleeve. Current sleeve size is appropriate based on today's measurements. Verified patient is aware of worsening signs and symptoms to watch for as well as when to contact the clinic.    Lymphedema Stage:  Stage 1    Recommendations:  Wear compression sleeve daily while awake. Self-massage and left side crawling the wall stretching exercise twice per day, morning and night. Continue with precautions, meticulous skin care, weight management as well as active healthy lifestyle.  Verified no referrals indicated at this time for any issues or concerns.    Plan:  Return to clinic in 4 weeks. Patient agreeable with recommendations and plan. Follow-up appointment scheduled at time of today's appointment. Verified patient has contact information should any questions or concerns arise.      02/04/22

## 2022-02-03 NOTE — Progress Notes
Spoke with patient regarding need for labs on 02/15/22 since she recently started on Verzenio.  Patient states she will go to [place] location on 02/15/22 to have labs drawn.

## 2022-02-04 ENCOUNTER — Ambulatory Visit: Admit: 2022-02-04 | Discharge: 2022-02-04 | Payer: MEDICARE

## 2022-02-04 ENCOUNTER — Encounter: Admit: 2022-02-04 | Discharge: 2022-02-04 | Payer: MEDICARE

## 2022-02-04 DIAGNOSIS — E079 Disorder of thyroid, unspecified: Secondary | ICD-10-CM

## 2022-02-04 DIAGNOSIS — M5030 Other cervical disc degeneration, unspecified cervical region: Secondary | ICD-10-CM

## 2022-02-04 DIAGNOSIS — M5136 Other intervertebral disc degeneration, lumbar region: Secondary | ICD-10-CM

## 2022-02-04 DIAGNOSIS — Z9189 Other specified personal risk factors, not elsewhere classified: Secondary | ICD-10-CM

## 2022-02-04 DIAGNOSIS — F32A Depression: Secondary | ICD-10-CM

## 2022-02-04 DIAGNOSIS — M5134 Other intervertebral disc degeneration, thoracic region: Secondary | ICD-10-CM

## 2022-02-04 DIAGNOSIS — F419 Anxiety disorder, unspecified: Secondary | ICD-10-CM

## 2022-02-04 DIAGNOSIS — B999 Unspecified infectious disease: Secondary | ICD-10-CM

## 2022-02-04 DIAGNOSIS — S83209A Unspecified tear of unspecified meniscus, current injury, unspecified knee, initial encounter: Secondary | ICD-10-CM

## 2022-02-04 DIAGNOSIS — C50919 Malignant neoplasm of unspecified site of unspecified female breast: Secondary | ICD-10-CM

## 2022-02-04 DIAGNOSIS — C801 Malignant (primary) neoplasm, unspecified: Secondary | ICD-10-CM

## 2022-02-04 DIAGNOSIS — M549 Dorsalgia, unspecified: Secondary | ICD-10-CM

## 2022-02-04 DIAGNOSIS — Z789 Other specified health status: Secondary | ICD-10-CM

## 2022-02-04 DIAGNOSIS — R011 Cardiac murmur, unspecified: Secondary | ICD-10-CM

## 2022-02-04 DIAGNOSIS — E039 Hypothyroidism, unspecified: Secondary | ICD-10-CM

## 2022-02-04 DIAGNOSIS — D539 Nutritional anemia, unspecified: Secondary | ICD-10-CM

## 2022-02-04 DIAGNOSIS — I10 Essential (primary) hypertension: Secondary | ICD-10-CM

## 2022-02-04 DIAGNOSIS — M4800 Spinal stenosis, site unspecified: Secondary | ICD-10-CM

## 2022-02-04 DIAGNOSIS — R519 Generalized headaches: Secondary | ICD-10-CM

## 2022-02-04 DIAGNOSIS — IMO0002 Ulcer: Secondary | ICD-10-CM

## 2022-02-04 DIAGNOSIS — C73 Malignant neoplasm of thyroid gland: Secondary | ICD-10-CM

## 2022-02-04 DIAGNOSIS — M2550 Pain in unspecified joint: Secondary | ICD-10-CM

## 2022-02-08 ENCOUNTER — Encounter: Admit: 2022-02-08 | Discharge: 2022-02-08 | Payer: MEDICARE

## 2022-02-12 ENCOUNTER — Encounter: Admit: 2022-02-12 | Discharge: 2022-02-12 | Payer: MEDICARE

## 2022-02-15 ENCOUNTER — Ambulatory Visit: Admit: 2022-02-15 | Discharge: 2022-02-15 | Payer: MEDICARE

## 2022-02-15 ENCOUNTER — Encounter: Admit: 2022-02-15 | Discharge: 2022-02-15 | Payer: MEDICARE

## 2022-02-15 DIAGNOSIS — Z136 Encounter for screening for cardiovascular disorders: Secondary | ICD-10-CM

## 2022-02-15 DIAGNOSIS — E890 Postprocedural hypothyroidism: Secondary | ICD-10-CM

## 2022-02-15 DIAGNOSIS — Z0000 Encounter for general adult medical examination without abnormal findings: Secondary | ICD-10-CM

## 2022-02-15 DIAGNOSIS — Z79811 Long term (current) use of aromatase inhibitors: Secondary | ICD-10-CM

## 2022-02-15 DIAGNOSIS — C50112 Malignant neoplasm of central portion of left female breast: Secondary | ICD-10-CM

## 2022-02-15 LAB — LIPID PROFILE
CHOLESTEROL: 227 mg/dL — ABNORMAL HIGH (ref <200)
HDL: 74 mg/dL (ref >40)
NON HDL CHOLESTEROL: 153 mg/dL (ref 98–110)
TRIGLYCERIDES: 126 mg/dL (ref <150)
VLDL: 25 mg/dL (ref 3.5–5.1)

## 2022-02-15 LAB — CBC AND DIFF
ABSOLUTE BASO COUNT: 0 K/UL (ref 0–0.20)
ABSOLUTE EOS COUNT: 0.1 K/UL (ref 0–0.45)
ABSOLUTE LYMPH COUNT: 0.9 K/UL — ABNORMAL LOW (ref 1.0–4.8)
ABSOLUTE MONO COUNT: 0.3 K/UL (ref 0–0.80)
ABSOLUTE NEUTROPHIL: 1.8 K/UL (ref 1.8–7.0)
BASOPHILS %: 1 % (ref 0–2)
EOSINOPHILS %: 4 % (ref 0–5)
LYMPHOCYTES %: 28 % (ref >60)
MCH: 32 pg (ref 26–34)
MCHC: 32 g/dL (ref 32.0–36.0)
MONOCYTES %: 11 % (ref 4–12)
MPV: 8.8 FL (ref 7–11)
NEUTROPHILS %: 56 % (ref 41–77)
PLATELET COUNT: 185 K/UL (ref 150–400)
RBC COUNT: 3.8 M/UL — ABNORMAL LOW (ref 4.0–5.0)
RDW: 13 % (ref 11–15)

## 2022-02-15 LAB — COMPREHENSIVE METABOLIC PANEL
BLD UREA NITROGEN: 13 mg/dL — ABNORMAL LOW (ref 7–25)
CALCIUM: 9.3 mg/dL (ref 8.5–10.6)
CHLORIDE: 102 MMOL/L — ABNORMAL HIGH (ref 98–110)
GLUCOSE,PANEL: 93 mg/dL (ref 70–100)
POTASSIUM: 4.3 MMOL/L (ref 3.5–5.1)
SODIUM: 138 MMOL/L (ref 137–147)
TOTAL BILIRUBIN: 0.5 mg/dL (ref 0.3–1.2)
TOTAL PROTEIN: 7.3 g/dL (ref 6.0–8.0)

## 2022-02-15 LAB — FREE T4-FREE THYROXINE: FREE T4: 1.2 ng/dL (ref 0.6–1.6)

## 2022-02-17 ENCOUNTER — Encounter: Admit: 2022-02-17 | Discharge: 2022-02-17 | Payer: MEDICARE

## 2022-02-17 DIAGNOSIS — E7800 Pure hypercholesterolemia, unspecified: Secondary | ICD-10-CM

## 2022-02-18 ENCOUNTER — Encounter: Admit: 2022-02-18 | Discharge: 2022-02-18 | Payer: MEDICARE

## 2022-02-19 ENCOUNTER — Encounter: Admit: 2022-02-19 | Discharge: 2022-02-19 | Payer: MEDICARE

## 2022-02-19 DIAGNOSIS — C50112 Malignant neoplasm of central portion of left female breast: Secondary | ICD-10-CM

## 2022-02-19 MED ORDER — VERZENIO 50 MG PO TAB
50 mg | ORAL_TABLET | Freq: Two times a day (BID) | ORAL | 0 refills
Start: 2022-02-19 — End: ?

## 2022-02-20 ENCOUNTER — Encounter: Admit: 2022-02-20 | Discharge: 2022-02-20 | Payer: MEDICARE

## 2022-02-20 DIAGNOSIS — E7800 Pure hypercholesterolemia, unspecified: Secondary | ICD-10-CM

## 2022-02-22 ENCOUNTER — Encounter: Admit: 2022-02-22 | Discharge: 2022-02-22 | Payer: MEDICARE

## 2022-02-22 DIAGNOSIS — C50112 Malignant neoplasm of central portion of left female breast: Secondary | ICD-10-CM

## 2022-02-22 DIAGNOSIS — M810 Age-related osteoporosis without current pathological fracture: Secondary | ICD-10-CM

## 2022-02-22 MED ORDER — ZOLEDRONIC ACID IVPB
3.5 mg | Freq: Once | INTRAVENOUS | 0 refills
Start: 2022-02-22 — End: ?

## 2022-02-22 MED ORDER — VERZENIO 50 MG PO TAB
50 mg | ORAL_TABLET | Freq: Two times a day (BID) | ORAL | 0 refills | Status: AC
Start: 2022-02-22 — End: ?
  Filled 2022-02-23: qty 56, 28d supply, fill #1

## 2022-02-22 MED ORDER — VERZENIO 50 MG PO TAB
50 mg | ORAL_TABLET | Freq: Two times a day (BID) | ORAL | 0 refills | Status: CN
Start: 2022-02-22 — End: ?

## 2022-02-24 ENCOUNTER — Encounter: Admit: 2022-02-24 | Discharge: 2022-02-24 | Payer: MEDICARE

## 2022-02-24 ENCOUNTER — Ambulatory Visit: Admit: 2022-02-24 | Discharge: 2022-02-24 | Payer: MEDICARE

## 2022-02-24 DIAGNOSIS — R011 Cardiac murmur, unspecified: Secondary | ICD-10-CM

## 2022-02-24 DIAGNOSIS — I10 Essential (primary) hypertension: Secondary | ICD-10-CM

## 2022-02-24 DIAGNOSIS — IMO0002 Ulcer: Secondary | ICD-10-CM

## 2022-02-24 DIAGNOSIS — E079 Disorder of thyroid, unspecified: Secondary | ICD-10-CM

## 2022-02-24 DIAGNOSIS — M549 Dorsalgia, unspecified: Secondary | ICD-10-CM

## 2022-02-24 DIAGNOSIS — D539 Nutritional anemia, unspecified: Secondary | ICD-10-CM

## 2022-02-24 DIAGNOSIS — C801 Malignant (primary) neoplasm, unspecified: Secondary | ICD-10-CM

## 2022-02-24 DIAGNOSIS — Z789 Other specified health status: Secondary | ICD-10-CM

## 2022-02-24 DIAGNOSIS — F32A Depression: Secondary | ICD-10-CM

## 2022-02-24 DIAGNOSIS — S8992XA Unspecified injury of left lower leg, initial encounter: Secondary | ICD-10-CM

## 2022-02-24 DIAGNOSIS — B999 Unspecified infectious disease: Secondary | ICD-10-CM

## 2022-02-24 DIAGNOSIS — C73 Malignant neoplasm of thyroid gland: Secondary | ICD-10-CM

## 2022-02-24 DIAGNOSIS — M5134 Other intervertebral disc degeneration, thoracic region: Secondary | ICD-10-CM

## 2022-02-24 DIAGNOSIS — M5030 Other cervical disc degeneration, unspecified cervical region: Secondary | ICD-10-CM

## 2022-02-24 DIAGNOSIS — E039 Hypothyroidism, unspecified: Secondary | ICD-10-CM

## 2022-02-24 DIAGNOSIS — R519 Generalized headaches: Secondary | ICD-10-CM

## 2022-02-24 DIAGNOSIS — F419 Anxiety disorder, unspecified: Secondary | ICD-10-CM

## 2022-02-24 DIAGNOSIS — M4800 Spinal stenosis, site unspecified: Secondary | ICD-10-CM

## 2022-02-24 DIAGNOSIS — S83209A Unspecified tear of unspecified meniscus, current injury, unspecified knee, initial encounter: Secondary | ICD-10-CM

## 2022-02-24 DIAGNOSIS — M5136 Other intervertebral disc degeneration, lumbar region: Secondary | ICD-10-CM

## 2022-02-24 DIAGNOSIS — M2550 Pain in unspecified joint: Secondary | ICD-10-CM

## 2022-02-24 DIAGNOSIS — C50919 Malignant neoplasm of unspecified site of unspecified female breast: Secondary | ICD-10-CM

## 2022-02-24 DIAGNOSIS — M25552 Pain in left hip: Secondary | ICD-10-CM

## 2022-02-24 MED ORDER — METHOCARBAMOL 500 MG PO TAB
1000 mg | ORAL_TABLET | Freq: Four times a day (QID) | ORAL | 0 refills | Status: AC
Start: 2022-02-24 — End: ?

## 2022-02-24 NOTE — Progress Notes
Chief Complaint   Patient presents with   ? Left Knee - Pain     The patient fell down her steps yesterday, went to [facility] yesterday, and        History of Present Illness:   Patient is a 61 year old female being seen today in the Ortho same-day clinic for left knee pain, posterior rib pain on the left side and left posterior hip pain.  Patient had a fall yesterday down 7 stairs at home in which she was trying to dust.  Patient states she turned her knee inward when the fall occurred due to the knee locking.  States she did hit her head and was seen in the emergency department yesterday for a CT of her head along with a hip x-ray and which were benign for acute injury.  Patient has been taking methocarbamol that is her husband's every 6 hours and states it has been alleviating some of the spasms she is experiencing in her rib cage.  Patient states she is supposed to have a CT of her chest next week and would like to forego the chest x-ray at this time due to limiting radiation.  Patient has had a previous ACL repair in 2001 in her left knee which is apparent and imaging.     The following portions of the patient's history were reviewed and updated as appropriate.  Past Medical History:     Medical History:   Diagnosis Date   ? Acquired hypothyroidism    ? Anxiety 2016    from pain   ? Back pain    ? Breast cancer in female [facility]) 12/2020    Left   ? Cancer of thyroid (HCC) 1980   ? Degenerative disc disease, cervical 1994   ? Degenerative disc disease, lumbar 2010   ? Degenerative disc disease, thoracic 2010   ? Depression situational, transient    after divorce, after death of 2nd husband   ? Essential hypertension began in 3rd trimester most pregnancies    remained after last pregnancy   ? Essential hypertension, benign as above   ? Generalized headaches 1996-2010    probably hormonally related   ? Heart murmur at birth    Dr. Maisie Fus said he didn't hear it a few years ago   ? Infection    ? Joint pain 1994   ? Limb alert care status     No access on R-arm   ? Other malignant neoplasm without specification of site thyroid, 1980    thyroidectomy   ? Spinal stenosis 2017 to present    per MRIs   ? Thyroid disorder as noted   ? Torn meniscus 12/25/2020   ? Ulcer 1987    with divorce; resolved   ? Unspecified deficiency anemia 2009    from excessive bleeding post-partum; treated iron       Past Surgical History:     Surgical History:   Procedure Laterality Date   ? HX WRIST FRACTURE SURGERY  1993    Baker's thumb with fixation   ? ARTHROPLASTY  2001    L ACL   ? COLONOSCOPY  2017    normal   ? ARTHROSCOPY KNEE WITH PARTIAL LATERAL MENISCECTOMY AND LEFT KNEE INJECTION. Right 01/08/2021    Performed by Vopat, Lowry Ram, MD at [facility] OR   ? BILATERAL TOTAL MASTECTOMIES Bilateral 01/22/2021    Performed by Massie Kluver, MD at [facility] OR   ? INTRAOPERATIVE SENTINEL LYMPH NODE IDENTIFICATION  WITH/ WITHOUT NON-RADIOACTIVE DYE INJECTION Left 01/22/2021    Performed by Massie Kluver, MD at [facility] OR   ? INJECTION RADIOACTIVE TRACER FOR SENTINEL NODE IDENTIFICATION Left 01/22/2021    Performed by Massie Kluver, MD at [facility]   ? LEFT AXILLARY SENTINEL LYMPH NODE BIOPSY Left 01/22/2021    Performed by Massie Kluver, MD at [facility]   ? BILATERAL CHEST FLAT CLOSURE Bilateral 01/22/2021    Performed by Erlene Quan, Shallyn Constancio George, MD at [facility]   ? BILATERAL CHEST FLAT CLOSURE x 8 Bilateral 01/22/2021    Performed by Stevenson Clinch, MD at [facility]   ? EXCISION BENIGN LESION 0.5 CM OR LESS - TORSO Right 01/22/2021    Performed by Erlene Quan, Analys Ryden George, MD at [facility] OR   ? TUNNELED VENOUS PORT PLACEMENT Right 02/19/2021   ? Placement of port-a-cath - 50F Right 02/19/2021    Performed by Freund, Alecia Lemming., MD at [facility] OR   ? FLUOROSCOPIC GUIDANCE CENTRAL VENOUS ACCESS DEVICE PLACEMENT/ REPLACEMENT/ REMOVAL N/A 02/19/2021    Performed by Flo Shanks, Alecia Lemming., MD at [facility] OR   ? Left Completion Axillary Lymph Node Dissection Left 05/14/2021 Performed by Massie Kluver, MD at [facility]   ? REMOVAL TUNNELED CENTRAL VENOUS ACCESS DEVICE INCLUDING PORT/ PUMP Right 05/14/2021    Performed by Massie Kluver, MD at [facility] OR   ? COLONOSCOPY DIAGNOSTIC WITH SPECIMEN COLLECTION BY BRUSHING/ WASHING - FLEXIBLE N/A 10/14/2021    Performed by Benetta Spar, MD at [facility] OR   ? KNEE SURGERY  L ACL as above   ? THYROIDECTOMY         Family History:  Family History   Problem Relation Age of Onset   ? Arthritis Paternal Grandmother         (osteoarthritis) multiple heberdens noduls and deformities   ? Back pain Paternal Grandmother    ? Arthritis Mother         wear and tear   ? Back pain Mother    ? Hypertension Mother    ? Joint Pain Mother    ? Neck Pain Mother    ? Cancer-Breast Mother 13        was on HRT for 10-15 years   ? Basal Cell Carcinoma Brother    ? Back pain Brother    ? Hypertension Brother    ? Basal Cell Carcinoma Brother    ? Back pain Brother         has had surgery   ? Hypertension Brother    ? Basal Cell Carcinoma Brother    ? Back pain Brother    ? Hypertension Brother    ? Diabetes Father         Type 2   ? Heart problem Father         CABG 3   ? Heart Disease Father    ? Hypertension Father    ? Diabetes Maternal Grandfather         Type 2   ? Birth Defect Daughter         PRS, congenital diaphragmatic hernia, malrotation, grey matter heterotopia   ? Stroke Maternal Uncle    ? Thyroid Disease Maternal Uncle    ? Diabetes Paternal Grandfather    ? Birth Defect Nephew         craniosynostosis   ? Cancer-Breast Maternal Great-Aunt 70  Social History:   Social History     Socioeconomic History   ? Marital status: Married   Occupational History   ? Occupation: retired Engineer, civil (consulting)   Tobacco Use   ? Smoking status: Never     Passive exposure: Past   ? Smokeless tobacco: Never   Vaping Use   ? Vaping Use: Never used   Substance and Sexual Activity   ? Alcohol use: Never     Comment: one per month beer   ? Drug use: Never     Comment: in late teens, early twenties, a few times and not since   ? Sexual activity: Yes     Partners: Male     Birth control/protection: Post-menopausal, None       Current Medications: has a current medication list which includes the following prescription(s): verzenio, acetaminophen, anastrozole, biotin, calcium, cholecalciferol (vitamin d3), diclofenac sodium dr, escitalopram oxalate, letrozole, lisinopril, omeprazole dr, synthroid, tramadol, vitamins, b complex, and vitamins, multiple.    Allergies: is allergic to oxycodone and sudafed [pseudoephedrine hcl].    Review of Systems:  Review of Systems  General Physical Exam:  General/Constitutional:No apparent distress: well-nourished and well developed.  Eyes: Sclera nonicteric, conjunctiva clear  Respiratory:No shortness of breath or dyspnea  Cardiac: No clubbing, cyanosis, or edema  Vascular: No edema, swelling or tenderness, except as noted in detailed exam.  Integumentary:No impressive skin lesions present, except as noted in detailed exam.  Neuro/Psych: Normal mood and affect, oriented to person, place and time.  Musculoskeletal: Normal, except as noted in detailed exam and in HPI.    Physical Exam  Vitals and nursing note reviewed.   BP 120/77 (BP Source: Arm, Right Upper, Patient Position: Sitting)  - Pulse 77  - Temp 36.6 ?C (97.8 ?F) (Temporal)  - Ht 160 cm (5' 3)  - Wt 70.3 kg (155 lb)  - SpO2 97%  - BMI 27.46 kg/m?   Constitutional:       Appearance: Normal appearance.   Pulmonary:      Effort: Pulmonary effort is normal. All lung fields clear to ausculation. Chest rise and fall appear symmetrical and non labored.  Skin:     General: Skin is warm.   Neurological:      Mental Status: He is alert.   Psychiatric:         Mood and Affect: Mood normal.         Behavior: Behavior normal.         Thought Content: Thought content normal.         Judgment: Judgment normal.   No signs or symptoms of infection, neurovascular intact  No obvious acute deformity present  No obvious edema, erythema, or ecchymosis  Normal range of motion  Strength 5 out of 5  Mild pain with palpation on lateral aspect of left knee joint space.  No effusion present and some discomfort with Lachman's test although no overt lax movement.  Patient has mild tenderness with palpation over the left posterior hip in which peers to have a half dollar size of ecchymosis.   There is some laxity with drawer testing on the left knee in comparison with the right    Imaging:    Exam: KNEE 3 VIEWS LEFT     CLINICAL INDICATION: 61 years to be seen at Same Day Clinic- left knee   PAIN     COMPARISON: None.     IMPRESSION       1. ?Left ACL graft reconstruction.  2. 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     3. 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       ?Finalized by Newman Nickels, M.D. on 02/24/2022 10:18 AM. Dictated by Newman Nickels, M.D. on 02/24/2022 10:02 AM.     Assessment and Plan:     At today's visit I saw the patient for left knee pain, posterior left rib pain and left hip pain.      I reviewed all available records and imaging in the room with the patient and discussed treatment options regarding the imaging results, symptoms, and physical exam.    Consult and treatment options (pharmaceuticals, formal PT, injections) were discussed with the patient and consistent with their current condition.    I will get them started on Robaxin 500 mg 1 p.o. twice daily as needed to help with their pain and current condition. We discussed how to take the medications and what to expect when taking them.      It sounds like she has been having difficulty with her left knee prior to this injury which could represent changes in her ACL graft.  I would recommend to get her started in formal physical therapy to work on strengthening and stabilization and have her follow-up with Dr. Arlys John Vopat in 6 to 8 weeks to reevaluate the knee.  If she is doing better and feels more stable there will be no need for further treatment but if this problem continues we may need to get an MRI to reevaluate the knee.    We discussed OTC nsaids (both oral and topical), tylenol, and ice/heat for the problem.      RICE: Rest, ice, compression, and elevation to help alleviate symptoms at this time.     Start formal Physical therapy as prescribed.    Continue with activity as tolerated at this time letting pain be their guide.    All questions were answered at today's visit.          Total Time Today was 20 minutes in the following activities: Preparing to see the patient, Obtaining and/or reviewing separately obtained history, Performing a medically appropriate examination and/or evaluation, Counseling and educating the patient/family/caregiver, Ordering medications, tests, or procedures, Referring and communication with other health care professionals (when not separately reported), Documenting clinical information in the electronic or other health record, Independently interpreting results (not separately reported) and communicating results to the patient/family/caregiver and Care coordination (not separately reported)

## 2022-02-24 NOTE — Patient Instructions
Today you were seen by Purcell Nails, APRN.  Your plan includes discussions and care including:    Knee brace for compression, swelling reduction, and stability   Weightbearing and activity as tolerated using pain as your guide   Ice frequently (30-45 min) to painful areas, especially after activity.   Elevate above the level of your heart to reduce swelling      OTC medication options  Voltaren/diclofenac gel 3-4 times per day to painful areas   Topical CBD cream or oil  Lidocaine cream/patches  If you had imaging today our radiologists will over read over the images from visit today and we will contact you if any changes to treatment need to be made  If you have severe/intolerable pain, fever, weakness, change in bowel/bladder control, or swelling/redness, new numbness and tingling - please seek emergency room care for prompt evaluation and treatment of your symptoms     You have been given a DJO ([employer]) product during today's visit. If you have any questions about the product or billing information, please contact our [employer]. Please contact him during business hours only (M-F 8-4:30); his cell number is [phone] or email is [email or web address]     Thank you for choosing [facility]!   Please reach out with any questions via MyChart message or calling our Same Day Clinic nurse line at [phone]  To schedule an appointment in our Ortho/Sports Med department please call [phone]    Phone: [phone]   Fax: [phone]   [address], [place], [place] [address]

## 2022-02-26 ENCOUNTER — Encounter: Admit: 2022-02-26 | Discharge: 2022-02-26 | Payer: MEDICARE

## 2022-03-01 ENCOUNTER — Encounter: Admit: 2022-03-01 | Discharge: 2022-03-01 | Payer: MEDICARE

## 2022-03-01 DIAGNOSIS — M546 Pain in thoracic spine: Secondary | ICD-10-CM

## 2022-03-02 ENCOUNTER — Encounter: Admit: 2022-03-02 | Discharge: 2022-03-02 | Payer: MEDICARE

## 2022-03-02 ENCOUNTER — Ambulatory Visit: Admit: 2022-03-02 | Discharge: 2022-03-02 | Payer: MEDICARE

## 2022-03-02 DIAGNOSIS — R0781 Pleurodynia: Secondary | ICD-10-CM

## 2022-03-02 DIAGNOSIS — M546 Pain in thoracic spine: Secondary | ICD-10-CM

## 2022-03-05 ENCOUNTER — Encounter: Admit: 2022-03-05 | Discharge: 2022-03-05 | Payer: MEDICARE

## 2022-03-06 ENCOUNTER — Ambulatory Visit: Admit: 2022-03-06 | Discharge: 2022-03-06 | Payer: MEDICARE

## 2022-03-06 ENCOUNTER — Encounter: Admit: 2022-03-06 | Discharge: 2022-03-06 | Payer: MEDICARE

## 2022-03-06 DIAGNOSIS — E7800 Pure hypercholesterolemia, unspecified: Secondary | ICD-10-CM

## 2022-03-06 DIAGNOSIS — R053 Persistent dry cough: Secondary | ICD-10-CM

## 2022-03-06 DIAGNOSIS — R918 Other nonspecific abnormal finding of lung field: Secondary | ICD-10-CM

## 2022-03-08 ENCOUNTER — Encounter: Admit: 2022-03-08 | Discharge: 2022-03-08 | Payer: MEDICARE

## 2022-03-08 DIAGNOSIS — R590 Localized enlarged lymph nodes: Secondary | ICD-10-CM

## 2022-03-08 DIAGNOSIS — Z789 Other specified health status: Secondary | ICD-10-CM

## 2022-03-08 DIAGNOSIS — C50112 Malignant neoplasm of central portion of left female breast: Secondary | ICD-10-CM

## 2022-03-08 DIAGNOSIS — B999 Unspecified infectious disease: Secondary | ICD-10-CM

## 2022-03-08 DIAGNOSIS — M810 Age-related osteoporosis without current pathological fracture: Secondary | ICD-10-CM

## 2022-03-08 DIAGNOSIS — C801 Malignant (primary) neoplasm, unspecified: Secondary | ICD-10-CM

## 2022-03-08 DIAGNOSIS — M5134 Other intervertebral disc degeneration, thoracic region: Secondary | ICD-10-CM

## 2022-03-08 DIAGNOSIS — M4800 Spinal stenosis, site unspecified: Secondary | ICD-10-CM

## 2022-03-08 DIAGNOSIS — I10 Essential (primary) hypertension: Secondary | ICD-10-CM

## 2022-03-08 DIAGNOSIS — M5030 Other cervical disc degeneration, unspecified cervical region: Secondary | ICD-10-CM

## 2022-03-08 DIAGNOSIS — Z79811 Long term (current) use of aromatase inhibitors: Secondary | ICD-10-CM

## 2022-03-08 DIAGNOSIS — E079 Disorder of thyroid, unspecified: Secondary | ICD-10-CM

## 2022-03-08 DIAGNOSIS — IMO0002 Ulcer: Secondary | ICD-10-CM

## 2022-03-08 DIAGNOSIS — E039 Hypothyroidism, unspecified: Secondary | ICD-10-CM

## 2022-03-08 DIAGNOSIS — R519 Generalized headaches: Secondary | ICD-10-CM

## 2022-03-08 DIAGNOSIS — R011 Cardiac murmur, unspecified: Secondary | ICD-10-CM

## 2022-03-08 DIAGNOSIS — F32A Depression: Secondary | ICD-10-CM

## 2022-03-08 DIAGNOSIS — C50919 Malignant neoplasm of unspecified site of unspecified female breast: Secondary | ICD-10-CM

## 2022-03-08 DIAGNOSIS — D539 Nutritional anemia, unspecified: Secondary | ICD-10-CM

## 2022-03-08 DIAGNOSIS — C73 Malignant neoplasm of thyroid gland: Secondary | ICD-10-CM

## 2022-03-08 DIAGNOSIS — M549 Dorsalgia, unspecified: Secondary | ICD-10-CM

## 2022-03-08 DIAGNOSIS — M2550 Pain in unspecified joint: Secondary | ICD-10-CM

## 2022-03-08 DIAGNOSIS — M5136 Other intervertebral disc degeneration, lumbar region: Secondary | ICD-10-CM

## 2022-03-08 DIAGNOSIS — F419 Anxiety disorder, unspecified: Secondary | ICD-10-CM

## 2022-03-08 DIAGNOSIS — S83209A Unspecified tear of unspecified meniscus, current injury, unspecified knee, initial encounter: Secondary | ICD-10-CM

## 2022-03-08 DIAGNOSIS — Z9013 Acquired absence of bilateral breasts and nipples: Secondary | ICD-10-CM

## 2022-03-08 LAB — COMPREHENSIVE METABOLIC PANEL
ALBUMIN: 4.6 g/dL (ref 3.5–5.0)
ALK PHOSPHATASE: 60 U/L — ABNORMAL LOW (ref 25–110)
ALT: 19 U/L (ref 7–56)
ANION GAP: 6 K/UL (ref 3–12)
AST: 24 U/L (ref 7–40)
CALCIUM: 9.6 mg/dL (ref 8.5–10.6)
CO2: 30 MMOL/L (ref 21–30)
CREATININE: 1 mg/dL — ABNORMAL HIGH (ref 0.4–1.00)
EGFR: 59 mL/min — ABNORMAL LOW (ref >60)
GLUCOSE,PANEL: 73 mg/dL (ref 70–100)
POTASSIUM: 4.4 MMOL/L (ref 3.5–5.1)

## 2022-03-08 LAB — CBC AND DIFF
ABSOLUTE BASO COUNT: 0 K/UL (ref 0–0.20)
ABSOLUTE EOS COUNT: 0.1 K/UL (ref 0–0.45)
ABSOLUTE MONO COUNT: 0.5 K/UL (ref 0–0.80)
HEMATOCRIT: 37 % (ref 36–45)
MCH: 32 pg (ref 26–34)
MPV: 7 FL (ref 7–11)
PLATELET COUNT: 216 K/UL (ref 150–400)
RBC COUNT: 3.9 M/UL — ABNORMAL LOW (ref 4.0–5.0)
WBC COUNT: 4 K/UL — ABNORMAL LOW (ref 4.5–11.0)

## 2022-03-08 LAB — PHOSPHORUS: PHOSPHORUS: 3.9 mg/dL (ref 2.0–4.5)

## 2022-03-08 NOTE — Progress Notes
Oral Chemotherapy Pharmacist Reassessment Note    Summary of Therapy  [name] continues abemaciclib for the adjuvant treatment of breast cancer.  Cycle 1 start date was 10/25/21. Cindy Randolph confirms she is taking the medication as prescribed - abemaciclib 50mg  PO BID.     Adverse Effects and Adherence Assessment   Cindy Randolph is having the following adverse effects: fatigue and some abdominal cramping.   None of the adverse effects were severe enough to interfere with adherence. She is tolerating much better on the lower dose of abemaciclib.     The patient's ability to self-administer medication was re-assessed. The patient has the ability to self-administer this medication.  Cindy Randolph reports missing 0 doses in the past 2 month(s) of therapy. she was re-educated on importance of adherence.    Dose Appropriateness Assessment  No renal or hepatic dose adjustment is required at this time and treatment will continue until chemotherapy regimen complete.        CBC w diff    Lab Results   Component Value Date/Time    WBC 4.0 (L) 03/08/2022 08:40 AM    RBC 3.90 (L) 03/08/2022 08:40 AM    HGB 12.7 03/08/2022 08:40 AM    HCT 37.4 03/08/2022 08:40 AM    MCV 95.9 03/08/2022 08:40 AM    MCH 32.5 03/08/2022 08:40 AM    MCHC 33.9 03/08/2022 08:40 AM    RDW 13.5 03/08/2022 08:40 AM    PLTCT 216 03/08/2022 08:40 AM    MPV 7.0 03/08/2022 08:40 AM    Lab Results   Component Value Date/Time    NEUT 62 03/08/2022 08:40 AM    ANC 2.50 03/08/2022 08:40 AM    LYMA 22 (L) 03/08/2022 08:40 AM    ALC 0.90 (L) 03/08/2022 08:40 AM    MONA 12 03/08/2022 08:40 AM    AMC 0.50 03/08/2022 08:40 AM    EOSA 3 03/08/2022 08:40 AM    AEC 0.10 03/08/2022 08:40 AM    BASA 1 03/08/2022 08:40 AM    ABC 0.00 03/08/2022 08:40 AM        Comprehensive Metabolic Profile    Lab Results   Component Value Date/Time    NA 138 02/15/2022 07:36 AM    K 4.3 02/15/2022 07:36 AM    CL 102 02/15/2022 07:36 AM    CO2 28 02/15/2022 07:36 AM    GAP 8 02/15/2022 07:36 AM    BUN 13 02/15/2022 07:36 AM    CR 1.00 02/15/2022 07:36 AM    GLU 93 02/15/2022 07:36 AM    Lab Results   Component Value Date/Time    CA 9.3 02/15/2022 07:36 AM    PO4 3.4 02/15/2022 07:36 AM    ALBUMIN 4.6 02/15/2022 07:36 AM    TOTPROT 7.3 02/15/2022 07:36 AM    ALKPHOS 40 02/15/2022 07:36 AM    AST 20 02/15/2022 07:36 AM    ALT 16 02/15/2022 07:36 AM    TOTBILI 0.5 02/15/2022 07:36 AM            Serum creatinine: 1 mg/dL 16/10/96 0454  Estimated creatinine clearance: 57.5 mL/min    Medication Reconciliation and Drug/Food Interaction Assessment  A comprehensive medication reconciliation was performed for Cindy Randolph. her medication list was updated to reflect any identified changes and the patient?s current medication list is included below. [name] was instructed to speak with her healthcare provider and/or the oral chemotherapy pharmacist before starting any new drug, including, but not  limited to, prescription, over the counter, natural / herbal products, or vitamins and supplements.     Drug-drug and drug-food interactions were assessed between the patients? specialty medication(s) and her most current medication list. No significant drug-drug interactions were identified.     Home Medications    Medication Sig   abemaciclib (VERZENIO) 50 mg tablet Take one tablet by mouth twice daily.   acetaminophen (TYLENOL) 325 mg tablet Take two tablets by mouth every 6 hours. Take scheduled for 3 days after surgery, then as needed. Do not exceed 4,000mg  in a 24 hour period.   anastrozole (ARIMIDEX) 1 mg tablet Take one tablet by mouth daily for 360 days.   biotin 1 mg cap Take  by mouth.   CALCIUM PO Take  by mouth.   cholecalciferol (vitamin D3) (VITAMIN D3 PO) Take  by mouth.   diclofenac sodium DR (VOLTAREN) 50 mg tablet Take one tablet by mouth as Needed. Take with food.   escitalopram oxalate (LEXAPRO) 20 mg tablet Take one tablet by mouth daily.   letrozole [facility]) 2.5 mg tablet Take one tablet by mouth daily.   lisinopriL (ZESTRIL) 2.5 mg tablet Take one tablet by mouth daily.   methocarbamoL (ROBAXIN) 500 mg tablet Take two tablets by mouth four times daily.   omeprazole DR (PRILOSEC) 40 mg capsule Take one capsule by mouth daily.   SYNTHROID 100 mcg tablet Take one tablet by mouth daily 30 minutes before breakfast.   traMADoL (ULTRAM) 50 mg tablet Take one tablet by mouth at bedtime as needed for Pain.   vitamins, B complex tab Take one tablet by mouth three times weekly.   vitamins, multiple cap Take one capsule by mouth daily.       Allergies   Allergen Reactions   ? Oxycodone NAUSEA ONLY     Prefers tramadol   ? Sudafed [Pseudoephedrine Hcl] PALPITATIONS       Response to Therapy Assessment  The electronic medical record for Cindy Randolph has been reviewed. The patient?s provider has determined this therapy is appropriate to continue at this time. The patient will continue this therapy as she is achieving therapeutic benefit.     Immunization Assessment   Immunization History   Administered Date(s) Administered   ? COVID-19 (PFIZER), mRNA vacc, 30 mcg/0.3 mL (PF) 11/15/2019, 12/06/2019       Vaccine history and recommended vaccinations were reviewed and will be recommended as appropriate. The patient will be reminded about the importance of receiving an annual influenza vaccine as indicated.      Safety Assessment  Risk Evaluation and Mitigation Strategy (REMS)  No REMS is required for this medication.    Reproductive Risk  Cindy Randolph is a 61 y.o. female. As patient is a female not of child-bearing potential, education was not applicable.     Handling and Disposal  Appropriate safe handling and disposal procedures were reviewed with the patient. Cindy Randolph was instructed to return any unused or expired oral chemotherapy medication to a designated disposal bin at one of the [facility] locations or to utilize a community drug take back program. Instructed not to flush down the toilet or to crush the medication    Follow-up Plan   [name] was encouraged to call the oral chemotherapy pharmacist at [phone] with questions. This medication is considered medium risk per our internal oral chemotherapy risk categorization. This re-assessment has been completed and the next reassessment planned for 12 months.  Noel Gerold, [facility]  Oncology Clinical Pharmacist  03/08/2022

## 2022-03-08 NOTE — Patient Instructions
Thank you for coming to see us today.   Please call our office or send a message through MyChart if you have any questions or concerns.                                                                                                                 [facility]    [place] [facility]  [address]  [place], [place] [address]      [facility]  [address]  [place],  [address]      Dr. Lauren Nye  [phone] (Nurse Camie Aylyn Wenzler)  [phone] (Fax)      [email or web address]

## 2022-03-08 NOTE — Progress Notes
Name: Cindy Randolph          MRN: [id number]      DOB: November 03, 1960      AGE: 61 y.o.   DATE OF SERVICE: 03/08/2022    Subjective:             Reason for Visit:  Follow Up      Cindy Randolph is a 61 y.o. female.      Cancer Staging   Malignant neoplasm of left breast in female, estrogen receptor positive   Staging form: Breast, AJCC 8th Edition  - Clinical stage from 12/22/2020: Stage IA (cT1c, cN0, cM0, G1, ER+, PR+, HER2-) - Signed by Massie Kluver, MD on 12/22/2020  - Pathologic stage from 02/02/2021: Stage IIIA (pT3, pN3, cM0, G1, ER+, PR+, HER2-) - Signed by Massie Kluver, MD on 05/27/2021      History of Present Illness  Cindy Randolph is a 61 y.o. woman with a diagnosis of left hormone positive breast cancer in July 2022.    She presented for a routine mammogram in July 2022 which showed benign-appearing microcalcifications and an unchanged low-density circumscribed mass in the right upper upper central breast.  There was also a focal asymmetry in the left breast.  She had a diagnostic left mammogram on 12/04/2020 which showed an irregular density at 12:00 in the left breast and on target ultrasound there was an ill-defined area with slightly hypoechoic at 12:00 7 cm from the nipple.  She underwent ultrasound-guided biopsy on 12/10/2020 which showed grade 1 invasive lobular carcinoma ER 91 to 100%, PR 11 to 20%, HER2 1+ (negative by IHC), Ki-67 2 to 5%.  She then had an MRI on 12/26/2020 which showed multiple faint masses in the left breast with non-mass enhancement extending in the upper and inner quadrant.     On 01/22/2021 she underwent bilateral mastectomy with final pathology showing left breast invasive lobular carcinoma, grade 1, multifocal with connections that merged together for a total extent of 7.4 cm, 5 out of 5 lymph nodes were positive for carcinoma.  Markers were ER 96%, PR 10%, HER2 0, Ki-67 3%.  Right breast was benign.    She had staging scans 02/10/2021 which were negative for metastatic disease.  She then received adjuvant chemotherapy with dose dense AC from 02/23/2021 until 04/06/2021.  She was scheduled to start paclitaxel on 04/20/2021 but declined due to concern for neuropathy.     She underwent left axillary lymph node dissection on 05/14/2021 with final pathology showing carcinoma in 6 of 14 lymph nodes with the largest metastatic focus measuring 1 cm.  This is for a total of 11 out of 19 lymph nodes positive for carcinoma.    She completed adjuvant PMRT on 07/01/2021 and then started letrozole.    [name] is here for follow up. She is accompanied to this visit by her husband. She is doing stable on the Abemaciclib 50 mg BID - she is on her 2nd pack, 2nd refill. She is tolerating the Letrozole well. She recently went to the [facility] for falling down the stairs. She hit her head and back. She was diagnosed with a concussion and a torn Latismus Dorsi. She has no residual concussive side effects. She reports that her ACL gave out and caused the fall. She is having a lot of back spasms and is using the muscle relaxers with relief of symptoms. She is starting PT in one week. She feels a little  depressed since she is not able to travel and do the sports she use to do, due to other co morbidities. She feels like her fatigue is well controlled. She is taking Lexapro 20mg  daily and she feels like this is helpful for her mood stability.                Review of Systems   Constitutional: Positive for fatigue and unexpected weight change. Negative for activity change, chills, diaphoresis and fever.   HENT: Negative for mouth sores, nosebleeds and sinus pressure.    Eyes: Negative for pain and visual disturbance.   Respiratory: Negative for cough, shortness of breath and wheezing.    Cardiovascular: Negative for chest pain and palpitations.   Gastrointestinal: Negative for abdominal distention, abdominal pain, constipation, diarrhea and vomiting.   Genitourinary: Negative for menstrual problem and vaginal bleeding.   Musculoskeletal: Positive for arthralgias (tolerable ), back pain (stable), myalgias (stable) and neck pain (stable).   Skin: Negative for rash and wound.   Neurological: Positive for weakness (tolerable) and headaches (mild). Negative for dizziness, light-headedness and numbness.   Hematological: Negative for adenopathy. Does not bruise/bleed easily.   Psychiatric/Behavioral: Negative for sleep disturbance. The patient is not nervous/anxious.          Objective:         ? abemaciclib (VERZENIO) 50 mg tablet Take one tablet by mouth twice daily.   ? acetaminophen (TYLENOL) 325 mg tablet Take two tablets by mouth every 6 hours. Take scheduled for 3 days after surgery, then as needed. Do not exceed 4,000mg  in a 24 hour period.   ? anastrozole (ARIMIDEX) 1 mg tablet Take one tablet by mouth daily for 360 days.   ? biotin 1 mg cap Take  by mouth.   ? CALCIUM PO Take  by mouth.   ? cholecalciferol (vitamin D3) (VITAMIN D3 PO) Take  by mouth.   ? diclofenac sodium DR (VOLTAREN) 50 mg tablet Take one tablet by mouth as Needed. Take with food.   ? escitalopram oxalate (LEXAPRO) 20 mg tablet Take one tablet by mouth daily.   ? letrozole [facility]) 2.5 mg tablet Take one tablet by mouth daily.   ? lisinopriL (ZESTRIL) 2.5 mg tablet Take one tablet by mouth daily.   ? methocarbamoL (ROBAXIN) 500 mg tablet Take two tablets by mouth four times daily.   ? omeprazole DR (PRILOSEC) 40 mg capsule Take one capsule by mouth daily.   ? SYNTHROID 100 mcg tablet Take one tablet by mouth daily 30 minutes before breakfast.   ? traMADoL (ULTRAM) 50 mg tablet Take one tablet by mouth at bedtime as needed for Pain.   ? vitamins, B complex tab Take one tablet by mouth three times weekly.   ? vitamins, multiple cap Take one capsule by mouth daily.     Vitals:    03/08/22 0907   BP: 123/81   BP Source: Arm, Right Upper   Pulse: 72   Temp: 36.5 ?C (97.7 ?F)   Resp: 18   SpO2: 98%   TempSrc: Temporal PainSc: Five   Weight: 72.8 kg (160 lb 6.4 oz)     Body mass index is 28.41 kg/m?Marland Kitchen     Pain Score: Five  Pain Loc: Back (Left hip)    Fatigue Scale: 5    Pain Addressed:  Current regimen working to control pain. Recent fall.    Patient Evaluated for a Clinical Trial: No treatment clinical trial available for this patient.  [place] Cooperative Oncology Group performance status is 0, Fully active, able to carry on all pre-disease performance without restriction.        Physical Exam  Constitutional:       Appearance: She is well-developed.   HENT:      Head: Normocephalic and atraumatic.   Eyes:      General: No scleral icterus.     Conjunctiva/sclera: Conjunctivae normal.   Cardiovascular:      Rate and Rhythm: Normal rate.   Pulmonary:      Effort: Pulmonary effort is normal. No respiratory distress.   Chest:   Breasts:     Right: No mass, skin change or tenderness.      Left: No mass, skin change or tenderness.       Abdominal:      Tenderness: There is no abdominal tenderness.   Musculoskeletal:         General: No tenderness.      Cervical back: Normal range of motion and neck supple.   Lymphadenopathy:      Upper Body:      Right upper body: No supraclavicular or axillary adenopathy.      Left upper body: No supraclavicular or axillary adenopathy.   Skin:     General: Skin is warm and dry.      Findings: No rash.   Neurological:      Mental Status: She is alert and oriented to person, place, and time.      Sensory: No sensory deficit.      Motor: No weakness.   Psychiatric:         Behavior: Behavior normal.         Thought Content: Thought content normal.            CBC w diff    Lab Results   Component Value Date/Time    WBC 4.0 (L) 03/08/2022 08:40 AM    RBC 3.90 (L) 03/08/2022 08:40 AM    HGB 12.7 03/08/2022 08:40 AM    HCT 37.4 03/08/2022 08:40 AM    MCV 95.9 03/08/2022 08:40 AM    MCH 32.5 03/08/2022 08:40 AM    MCHC 33.9 03/08/2022 08:40 AM    RDW 13.5 03/08/2022 08:40 AM    PLTCT 216 03/08/2022 08:40 AM    MPV 7.0 03/08/2022 08:40 AM    Lab Results   Component Value Date/Time    NEUT 62 03/08/2022 08:40 AM    ANC 2.50 03/08/2022 08:40 AM    LYMA 22 (L) 03/08/2022 08:40 AM    ALC 0.90 (L) 03/08/2022 08:40 AM    MONA 12 03/08/2022 08:40 AM    AMC 0.50 03/08/2022 08:40 AM    EOSA 3 03/08/2022 08:40 AM    AEC 0.10 03/08/2022 08:40 AM    BASA 1 03/08/2022 08:40 AM    ABC 0.00 03/08/2022 08:40 AM        Comprehensive Metabolic Profile    Lab Results   Component Value Date/Time    NA 135 (L) 03/08/2022 08:40 AM    K 4.4 03/08/2022 08:40 AM    CL 99 03/08/2022 08:40 AM    CO2 30 03/08/2022 08:40 AM    GAP 6 03/08/2022 08:40 AM    BUN 13 03/08/2022 08:40 AM    CR 1.08 (H) 03/08/2022 08:40 AM    GLU 73 03/08/2022 08:40 AM    Lab Results   Component Value Date/Time    CA 9.6 03/08/2022 08:40 AM  PO4 3.9 03/08/2022 08:40 AM    ALBUMIN 4.6 03/08/2022 08:40 AM    TOTPROT 7.3 03/08/2022 08:40 AM    ALKPHOS 60 03/08/2022 08:40 AM    AST 24 03/08/2022 08:40 AM    ALT 19 03/08/2022 08:40 AM    TOTBILI 0.5 03/08/2022 08:40 AM                 Assessment and Plan:  Kellye Samp is a 61 y.o. postmenopausal woman with a history of Stage IIIA (pT3pN3cN0) hormone positive invasive lobular carcinoma.     She completed 4 cycles of adjuvant AC, declined Taxol. She completed radiation 07/01/21.    Long term plan includes at least 5 years of endocrine therapy. She started letrozole 06/2021. In addition recommend adjuvant abemaciclib x2 years. Started at 100mg  BID dose and if well tolerated planned to increase to standard 150mg  BID dosing. She did not tolerate Letrozole (joint arthralgia). Changed to Anastrozole 01/11/22. If hot flashes increase with changing AI, could consider prescribing Gabapentin 300 mg QHS.     Abemacicilb started 10/25/21. Did not tolerate 100 mg BID dose. Discontinued 12/07/21. Restarted at 50mg  BID 01/21/22. Tolerating well.     CT chest for follow up nodules: stable nodules, slight increase in right axillary lymph node immediately posterior to two surgical clips. Will obtain targeted ultrasound now for further evaluation.     Will need annual bone density while on an aromatase inhibitor. Also recommend getting 2-3 servings of calcium in diet and taking 2000IU vitamin D daily. Bone density on 04/22/21 with osteoporosis. Recommend adjuvant IV bisphosphonate (zolendonric acid) while on AI in setting of osteoporosis. Started zolendronic acid 11/16/21. Discussed side effects again with patient and provided handout. Per patient request, will extend out Zometa injection to 30 min. Will obtain repeat Bone Density now.    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    Fatigue; Recommend continuing morning stretches and increasing walks throughout the week to build up stamina.      Anxiety/depression. Patient tolerating Lexapro 20 mg. Continue therapy.      RTC in 2 months with lab.    Cordie Grice Sistrunk, APRN-NP     ATTESTATION    I personally interviewed and examined the patient.  I have reviewed the history, physical, impression and plan outlined by the Nurse Practitioner.    The patient presents with (HPI) 30 F with Stage IIIA ER+ breast cancer s/p chemotherapy [facility]), radiation and now on anastrozole and adjuvant abemaciclib (started 50mg  BID 01/21/22). Tolerating this better.  On examination there is well appearing female, breathing comfortably on room air, in no acute distress, no edema, no focal neuro deficits. Chest exam with no masses or skin changes. No axillary or supraclavicular adenopathy.  My impression is 53 F with ER+ breast cancer on adjuvant abemaciclib.   My plan is    - continue anastrozole   - continue abemaciclib at 50mg  BID      Staff name:  Joni Reining MD Date: 03/08/2022                      Joni Reining, MD  Breast Medical Oncology  Associate Professor of Internal Medicine, Division of Medical Oncology  [facility]

## 2022-03-08 NOTE — Progress Notes
Pt stated that she fell 02/23/22 and no injuries to report.

## 2022-03-10 ENCOUNTER — Encounter: Admit: 2022-03-10 | Discharge: 2022-03-10 | Payer: MEDICARE

## 2022-03-10 ENCOUNTER — Ambulatory Visit: Admit: 2022-03-10 | Discharge: 2022-03-10 | Payer: MEDICARE

## 2022-03-10 DIAGNOSIS — C50112 Malignant neoplasm of central portion of left female breast: Secondary | ICD-10-CM

## 2022-03-10 DIAGNOSIS — R590 Localized enlarged lymph nodes: Secondary | ICD-10-CM

## 2022-03-10 DIAGNOSIS — R9389 Abnormal findings on diagnostic imaging of other specified body structures: Secondary | ICD-10-CM

## 2022-03-10 NOTE — Telephone Encounter
Spoke with patient regarding right axillary biopsy results today- patient stated that radiologist explained results and recommending right lymph node biopsy via ultrasound, patient scheduled it at appointment today, currently scheduled for 03/24/22, pt requesting if this could be moved up sooner.  Request sent to imaging secretaries and will reach out to patient if possible.  No further questions per patient at this time.

## 2022-03-10 NOTE — Telephone Encounter
Returned pt Cindy Randolph for request to be called with questions- Patient states she "looked up" information on her upcoming axillary lymph node biopsy and is reading that this means she is stage 4 breast cancer.  Re assured patient that this is unknown at this time since biopsy has not been completed and we do not know what is causing the thickening of the lymph node at this time.  Informed patient that stage 4 usually entails positive cancer diagnosis via biopsy or other procedures, to organs or bone.  Patient encouraged to wait for biopsy and then pathology results because that will be the true answers to her questions.

## 2022-03-12 ENCOUNTER — Encounter: Admit: 2022-03-12 | Discharge: 2022-03-12 | Payer: MEDICARE

## 2022-03-12 DIAGNOSIS — Z9189 Other specified personal risk factors, not elsewhere classified: Secondary | ICD-10-CM

## 2022-03-12 NOTE — Progress Notes
LYMPHEDEMA DATASHEET-FOLLOW-UP    DATE:  03/12/2022    PATIENT NAME:  Cindy Randolph  DATE OF BIRTH:  01/01/1961  MRN:   [id number]    Follow-Up Visit:  5 week return visit for left arm lymphedema.   Breast Surgery History: Bilateral total mastectomy/left SLNB (5/5) with oncoplastic flat closure on 01/22/2021, left completion ALND (6/14) on 05/14/2021 with Dr. Leveda Anna.      Lymphedema Diagnosis: No - Stage 1 resolved with interventions     Diagnostic Definition: N/A       Risk Factors:  BMI over 30? No  Change in BMI from initial diagnosis? Stable   Recent infections? No  Radiation therapy? Yes, left chest wall + nodes 06/10/21 - 07/01/21  Chemotherapy? Yes, adjuvant ddAC 02/23/21 - 04/06/21, declined Taxol  Use of compression sleeve? Unilateral    Cindy Randolph had an elevated BIS result on 11/30/21. She completed a visit with Bess Harvest, RN on 12/02/21 who recommended a larger sized compression sleeve (Juzo 2 max instead of 2 regular, CCL1). At that visit Cindy Randolph's BIS had returned to normal. She completed a follow up BIS on 8/21 which was again elevated. Cindy Randolph reported experiencing fullness and tightness in her left arm. I recommended for Cindy Randolph to wear her compression sleeve in the evening for a couple of hours and with activities as well as perform MLD. We followed up in 1 week and Cindy Randolph reported that her symptoms had improved but they were still present. I recommended for Cindy Randolph to wear her sleeve during daytime hours and perform MLD twice daily for 1 week. We followed up on 9/5 via telephone. Cindy Randolph stated that she was still symptomatic. She stated that she would like to try the interventions for 1 more week before scheduling a clinic visit. We followed up on 9/11 and Cindy Randolph reported that her symptoms were still present. Follow up clinic visit was scheduled for 9/14. At that visit Cindy Randolph reported experiencing fullness in her left forearm. Recommended wearing compression sleeve 8 hours daily and performing stretches and self MLD twice daily. Cindy Randolph presented to clinic today for her 5 week return visit regarding left arm lymphedema.  Reviewed with patient any general concerns as it relates to lymphedema. Reviewed signs and symptoms of early lymphedema in affected arm. She reported experiencing fullness, heaviness, and aching in her left arm when she woke up this morning. She stated that she has continued to wear her sleeve during daytime hours and perform MLD twice daily. Cindy Randolph noted that she has been experiencing generalized body aches after starting her endocrine therapy.     Compression Garment Size:  Sleeve: Juzo CCL1 size II max     Bioimpedance Analysis     12/24/20 - Baseline 11/30/21 12/02/21 01/11/22 02/04/22 03/12/22   Current  13.4 10.8 12.7 14.0 4.0   Baseline 2.0        Change from Baseline 11.4 8.8 10.7 12.0 2.0     Normal, less than 3 standard deviations increase from baseline. Improved from 13.4 on 11/30/21.    Hand Dominance:  right handed    Circumferential Measurements completed 03/12/22 Circumferential Measurements completed 02/04/22 Circumferential Measurements completed 12/02/21   RUE/LUE cm RUE/ LUE cm RUE/LUE cm   Hand: 19.2 / 18.9 Hand: 18.7 / 18.3 Hand cm:  19.3 /  18.7    Wrist: 16.3 / 16.3 Wrist: 16.6 / 16.9 Wrist cm:  16.8 /  16.4     8 cm: 18.7 / 18.9 8 cm: 18.8 / 20.0 8  cm:  18.8 /  19.0   16 cm: 23.3 / 24.7 16 cm: 23.5 / 24.8 16 cm:  23.5 /  23.3   Elbow: 25.1 / 25.1 Elbow: 25.3 / 25.9 Elbow cm:  24.3 /  25.9   8 cm: 28.6 / 28.8 8 cm: 29.2 / 29.6 8 cm:  29.2 /  30.0   16 cm: 32.5 / 31.5 16 cm: 32.8 / 33.3 16 cm:  31.1 /  31.4   24 cm: 34.5 / 35.0 24 cm: 34.6 / 36.6 24 cm:  33.2 /  33.7            Assessment:  Bilateral upper extremity skin is pink, soft and intact. No rash, erythema or ulcerations noted.   No pitting, stemmer's, or fibrosis noted.   Visualization of vasculature and bony prominences equal in bilateral hands.   ROM: WNL, able to fully extend bilateral upper extremities above head without hesitation or guarding.     Discussed assessment with patient. Reviewed normal BIS result and circumferential arm measurements without areas of greater than 2 cm difference between arms.  Early lymphedema signs and symptoms, potential aggravating factors, lymphatic drainage pathways, treatment interventions, and risks of not treating discussed. Discussed with Cindy Randolph that since her BIS has returned to normal and she does not have measurement changes we can return back to ALND precautions which includes wearing the compression sleeve with activities and if symptomatic. We will recheck her BIS on 12/11 in coordination with her Dr. Donneta Romberg return. She will contact the office if she has any concerns before then.  Verified patient is aware of worsening signs and symptoms to watch for as well as when to contact the clinic.    Lymphedema Stage:  Stage 1 resolved with interventions.     Recommendations:  Wear compression sleeve with activities and if symptomatic.  Continue with precautions, meticulous skin care, weight management as well as active healthy lifestyle.  Verified no referrals indicated at this time for any issues or concerns.    Plan:  Return to clinic as needed. Repeat BIS on 12/11 with Dr. Donneta Romberg visit. Patient agreeable with recommendations and plan. Follow-up BIS appointment scheduled at time of today's appointment. Verified patient has contact information should any questions or concerns arise.      02/04/22

## 2022-03-13 ENCOUNTER — Encounter: Admit: 2022-03-13 | Discharge: 2022-03-13 | Payer: MEDICARE

## 2022-03-13 DIAGNOSIS — C50112 Malignant neoplasm of central portion of left female breast: Secondary | ICD-10-CM

## 2022-03-13 MED ORDER — VERZENIO 50 MG PO TAB
50 mg | ORAL_TABLET | Freq: Two times a day (BID) | ORAL | 0 refills
Start: 2022-03-13 — End: ?

## 2022-03-15 ENCOUNTER — Encounter: Admit: 2022-03-15 | Discharge: 2022-03-15 | Payer: MEDICARE

## 2022-03-15 DIAGNOSIS — C50112 Malignant neoplasm of central portion of left female breast: Secondary | ICD-10-CM

## 2022-03-15 MED ORDER — VERZENIO 50 MG PO TAB
50 mg | ORAL_TABLET | Freq: Two times a day (BID) | ORAL | 3 refills | Status: AC
Start: 2022-03-15 — End: ?
  Filled 2022-03-25: qty 56, 28d supply, fill #1

## 2022-03-16 ENCOUNTER — Encounter: Admit: 2022-03-16 | Discharge: 2022-03-16 | Payer: MEDICARE

## 2022-03-16 NOTE — Progress Notes
Left a message for pt to return my call re: appointment on 03/24/22.

## 2022-03-16 NOTE — Progress Notes
Contacted Cindy Randolph to refill their medication(s) VERZENIO 50 MG PO TAB.    On MyChart medication refill questionnaire patient reported changes to other medications, new conditions, unplanned healthcare visit and medication side effects: Zolpidem prn sleep trouble, may restart omeprazole, may start a statin  Sliding Hiatal hernia, some atherosclerosis in coronary vessels  My knee went out and I fell down 6 steps injuring latissimus dorsi   Fatigue, some stomach upset, woozy feeling, transient soft stool    The refill was processed. Ambulatory pharmacist notified.    [place]  [phone]

## 2022-03-17 NOTE — Progress Notes
Left a message for pt to return my call re: appointment on 03/24/22.

## 2022-03-18 NOTE — Progress Notes
Left a message for pt regarding arrival time for her 03/24/22 appointment.

## 2022-03-19 ENCOUNTER — Encounter: Admit: 2022-03-19 | Discharge: 2022-03-19 | Payer: MEDICARE

## 2022-03-19 ENCOUNTER — Ambulatory Visit: Admit: 2022-03-19 | Discharge: 2022-03-20 | Payer: MEDICARE

## 2022-03-19 DIAGNOSIS — M5030 Other cervical disc degeneration, unspecified cervical region: Secondary | ICD-10-CM

## 2022-03-19 DIAGNOSIS — C73 Malignant neoplasm of thyroid gland: Secondary | ICD-10-CM

## 2022-03-19 DIAGNOSIS — M2550 Pain in unspecified joint: Secondary | ICD-10-CM

## 2022-03-19 DIAGNOSIS — R519 Generalized headaches: Secondary | ICD-10-CM

## 2022-03-19 DIAGNOSIS — M549 Dorsalgia, unspecified: Secondary | ICD-10-CM

## 2022-03-19 DIAGNOSIS — M4800 Spinal stenosis, site unspecified: Secondary | ICD-10-CM

## 2022-03-19 DIAGNOSIS — M5416 Radiculopathy, lumbar region: Secondary | ICD-10-CM

## 2022-03-19 DIAGNOSIS — E039 Hypothyroidism, unspecified: Secondary | ICD-10-CM

## 2022-03-19 DIAGNOSIS — M5134 Other intervertebral disc degeneration, thoracic region: Secondary | ICD-10-CM

## 2022-03-19 DIAGNOSIS — F32A Depression: Secondary | ICD-10-CM

## 2022-03-19 DIAGNOSIS — S83209A Unspecified tear of unspecified meniscus, current injury, unspecified knee, initial encounter: Secondary | ICD-10-CM

## 2022-03-19 DIAGNOSIS — B999 Unspecified infectious disease: Secondary | ICD-10-CM

## 2022-03-19 DIAGNOSIS — F419 Anxiety disorder, unspecified: Secondary | ICD-10-CM

## 2022-03-19 DIAGNOSIS — D539 Nutritional anemia, unspecified: Secondary | ICD-10-CM

## 2022-03-19 DIAGNOSIS — I10 Essential (primary) hypertension: Secondary | ICD-10-CM

## 2022-03-19 DIAGNOSIS — Z789 Other specified health status: Secondary | ICD-10-CM

## 2022-03-19 DIAGNOSIS — C50919 Malignant neoplasm of unspecified site of unspecified female breast: Secondary | ICD-10-CM

## 2022-03-19 DIAGNOSIS — M47816 Spondylosis without myelopathy or radiculopathy, lumbar region: Secondary | ICD-10-CM

## 2022-03-19 DIAGNOSIS — IMO0002 Ulcer: Secondary | ICD-10-CM

## 2022-03-19 DIAGNOSIS — M5136 Other intervertebral disc degeneration, lumbar region: Secondary | ICD-10-CM

## 2022-03-19 DIAGNOSIS — E079 Disorder of thyroid, unspecified: Secondary | ICD-10-CM

## 2022-03-19 DIAGNOSIS — C801 Malignant (primary) neoplasm, unspecified: Secondary | ICD-10-CM

## 2022-03-19 DIAGNOSIS — R011 Cardiac murmur, unspecified: Secondary | ICD-10-CM

## 2022-03-19 NOTE — Progress Notes
SPINE CENTER HISTORY AND PHYSICAL    Chief Complaint:   Chief Complaint   Patient presents with   ? New Patient     Tspine back pain       Subjective     HISTORY OF PRESENT ILLNESS:   Cindy Randolph is a 61 y.o. female who  has a past medical history of Acquired hypothyroidism, Anxiety (2016), Back pain, Breast cancer in female [facility]) (12/2020), Cancer of thyroid (HCC) (1980), Degenerative disc disease, cervical (1994), Degenerative disc disease, lumbar (2010), Degenerative disc disease, thoracic (2010), Depression (situational, transient), Essential hypertension (began in 3rd trimester most pregnancies), Essential hypertension, benign (as above), Generalized headaches (1996-2010), Heart murmur (at birth), Infection, Joint pain (1994), Limb alert care status, Other malignant neoplasm without specification of site (thyroid, 1980), Spinal stenosis (2017 to present), Thyroid disorder (as noted), Torn meniscus (12/25/2020), Ulcer (1987), and Unspecified deficiency anemia (2009). who presents for evaluation.Patient is presenting with a longstanding history of low back left hip and leg pain as well as neck pain.  She was a patient at an outside pain clinic where they did epidural steroid injections and radiofrequency ablations with good relief a few years ago.  She reports pain is now getting more consistent and more bothersome in her left hip area, this is into the buttock muscles and occasionally radiates down the left leg.  She is currently in formal physical therapy, she is been going for the last 2 weeks and she thinks it is getting slightly better.  They are mostly focusing on her left knee but they are can start focusing on her hip and back soon.  She is also having issues with her neck.  She has been diagnosed with cervical stenosis, she has not had any interventions in her neck but they were talking about potentially doing an epidural steroid injection up there.  She has not had any spine surgery in the past.             Kimiyah Shedd denies any recent fevers, chills, infection, antibiotics, bowel or bladder incontinence, saddle anesthesia, bleeding issues, or recent anticoagulant.     ROS:   A 10 point review of systems is negative except for what is noted in the HPI above.    Past Medical History:  Medical History:   Diagnosis Date   ? Acquired hypothyroidism    ? Anxiety 2016    from pain   ? Back pain    ? Breast cancer in female [facility]) 12/2020    Left   ? Cancer of thyroid (HCC) 1980   ? Degenerative disc disease, cervical 1994   ? Degenerative disc disease, lumbar 2010   ? Degenerative disc disease, thoracic 2010   ? Depression situational, transient    after divorce, after death of 2nd husband   ? Essential hypertension began in 3rd trimester most pregnancies    remained after last pregnancy   ? Essential hypertension, benign as above   ? Generalized headaches 1996-2010    probably hormonally related   ? Heart murmur at birth    Dr. Maisie Fus said he didn't hear it a few years ago   ? Infection    ? Joint pain 1994   ? Limb alert care status     No access on R-arm   ? Other malignant neoplasm without specification of site thyroid, 1980    thyroidectomy   ? Spinal stenosis 2017 to present    per MRIs   ?  Thyroid disorder as noted   ? Torn meniscus 12/25/2020   ? Ulcer 1987    with divorce; resolved   ? Unspecified deficiency anemia 2009    from excessive bleeding post-partum; treated iron       Family History:  Family History   Problem Relation Age of Onset   ? Arthritis Paternal Grandmother         (osteoarthritis) multiple heberdens noduls and deformities   ? Back pain Paternal Grandmother    ? Arthritis Mother         wear and tear   ? Back pain Mother    ? Hypertension Mother    ? Joint Pain Mother    ? Neck Pain Mother    ? Cancer-Breast Mother 83        was on HRT for 10-15 years   ? Basal Cell Carcinoma Brother    ? Back pain Brother    ? Hypertension Brother    ? Basal Cell Carcinoma Brother    ? Back pain Brother         has had surgery   ? Hypertension Brother    ? Basal Cell Carcinoma Brother    ? Back pain Brother    ? Hypertension Brother    ? Diabetes Father         Type 2   ? Heart problem Father         CABG 3   ? Heart Disease Father    ? Hypertension Father    ? Diabetes Maternal Grandfather         Type 2   ? Birth Defect Daughter         PRS, congenital diaphragmatic hernia, malrotation, grey matter heterotopia   ? Stroke Maternal Uncle    ? Thyroid Disease Maternal Uncle    ? Diabetes Paternal Grandfather    ? Birth Defect Nephew         craniosynostosis   ? Cancer-Breast Maternal Great-Aunt 73       Social History:  Lives in [place] [address]*    Social History     Socioeconomic History   ? Marital status: Married   Occupational History   ? Occupation: retired Engineer, civil (consulting)   Tobacco Use   ? Smoking status: Never     Passive exposure: Past   ? Smokeless tobacco: Never   Vaping Use   ? Vaping Use: Never used   Substance and Sexual Activity   ? Alcohol use: Never     Comment: one per month beer   ? Drug use: Never     Comment: in late teens, early twenties, a few times and not since   ? Sexual activity: Yes     Partners: Male     Birth control/protection: Post-menopausal, None       Allergies:  Allergies   Allergen Reactions   ? Oxycodone NAUSEA ONLY     Prefers tramadol   ? Sudafed [Pseudoephedrine Hcl] PALPITATIONS       Medications:    Current Outpatient Medications:   ?  abemaciclib (VERZENIO) 50 mg tablet, Take one tablet by mouth twice daily., Disp: 56 tablet, Rfl: 3  ?  acetaminophen (TYLENOL) 325 mg tablet, Take two tablets by mouth every 6 hours. Take scheduled for 3 days after surgery, then as needed. Do not exceed 4,000mg  in a 24 hour period., Disp: 40 tablet, Rfl: 0  ?  anastrozole (ARIMIDEX) 1 mg tablet, Take one tablet  by mouth daily for 360 days., Disp: 90 tablet, Rfl: 3  ?  biotin 1 mg cap, Take  by mouth., Disp: , Rfl:   ?  CALCIUM PO, Take  by mouth., Disp: , Rfl:   ? cholecalciferol (vitamin D3) (VITAMIN D3 PO), Take  by mouth., Disp: , Rfl:   ?  diclofenac sodium DR (VOLTAREN) 50 mg tablet, Take one tablet by mouth as Needed. Take with food., Disp: , Rfl:   ?  escitalopram oxalate (LEXAPRO) 20 mg tablet, Take one tablet by mouth daily., Disp: 90 tablet, Rfl: 3  ?  letrozole (FEMARA) 2.5 mg tablet, Take one tablet by mouth daily., Disp: 90 tablet, Rfl: 3  ?  lisinopriL (ZESTRIL) 2.5 mg tablet, Take one tablet by mouth daily., Disp: , Rfl:   ?  methocarbamoL (ROBAXIN) 500 mg tablet, Take two tablets by mouth four times daily., Disp: 40 tablet, Rfl: 0  ?  omeprazole DR (PRILOSEC) 40 mg capsule, Take one capsule by mouth daily., Disp: , Rfl:   ?  SYNTHROID 100 mcg tablet, Take one tablet by mouth daily 30 minutes before breakfast., Disp: 90 tablet, Rfl: 3  ?  traMADoL (ULTRAM) 50 mg tablet, Take one tablet by mouth at bedtime as needed for Pain., Disp: 10 tablet, Rfl: 0  ?  vitamins, B complex tab, Take one tablet by mouth three times weekly., Disp: , Rfl:   ?  vitamins, multiple cap, Take one capsule by mouth daily., Disp: , Rfl:     Physical examination:   BP 128/84 (BP Source: Arm, Left Upper, Patient Position: Sitting)  - Pulse 79  - Ht 160 cm (5' 2.99)  - Wt 72.6 kg (160 lb)  - SpO2 99%  - BMI 28.35 kg/m?   Pain Score: Four    Gen: Alert & Oriented X 3  HEENT: EOMI  Neck: Supple, no elevated JVP  Heart: Extremities well perfused  Lungs: non labored breathing  Abdomen: Soft, non-tender, non-distended  Skin: no gross lesions appreciated  Ext: purposeful movement of extremities     UPPER EXTREMITIES  MS:   Root Right Left   Shoulder Abduction C5 5 5   Elbow Flexion C5 5 5   Elbow Extension C7 5 5   Wrist Extension C6 5 5   Finger Flexion C8 5 5   Finger Abduction T1 5 5     Adequate cervical range of motion with flexion, extension, side bending and rotation.    Full AROM with bilateral shoulder forward flexion, abduction, internal and external rotation.      No tenderness to palpation through the cervical spinous processes, SCM, splenius capitis, scalenes, paraspinal musculature, trapezius, rhomboids.      Spurlings negative.     Neuro:  DTR's 1+ in bilateral biceps, triceps, brachioradialis   Upper Extremity Tone Normal   Upper Extremity Sensation Intact to light touch bilaterally     LOWER EXTREMITIES  MS:   Root Right Left   Hip Flexion L2 5 5   Knee Flexion L5/S1 5 5   Knee Extension L3 5 5   Dorsiflexion L4 5 5   Plantarflexion S1 5 5   EHL Extension L5 5 4     Gait was smooth and symmetric with equal arm swing.        No pain reproduced with facet loading.    No tenderness to palpation along the spinous process, facet joints, paraspinal musculature, SI joints, gluteal musculature, greater trochanters.  Patient is able to forward flex to knees, and able to extend without significant pain.    Full ROM bilateral lower extremities.      Negative slump testing.  Negative straight leg testing to 30-75 degrees.      Negative FADIR testing.  Negative Patrick-FABER testing.  Negative Fortin Finger Sign.   Negative Gaenslens testing.  Negative SI compression    Neuro:  DTR's 2+ right patella, 2+ left patella  2+ right achilles, 2+ left achilles   Lower Extremity Tone Normal   Lower Extremity Sensation Intact to light touch bilaterally     DIAGNOSTICS:      L SPINE AP & LATERAL    Narrative  Exam: L SPINE AP & LATERAL    CLINICAL INDICATION: 61 years Female. Low back pain 6 weeks or more    COMPARISON: L-spine radiographs June 08, 2018.    Impression  1.  Mild left convexity lumbar spinal curvature.    2.  Grade 1 anterolisthesis at L4-L5 with moderate degenerative disc disease.    3.  5 nonrib-bearing lumbar-type vertebral bodies with lumbarization of the S1 vertebral body.    4.  At least moderate facet osteoarthritis in the lower lumbar spine.      Finalized by Ranee Gosselin, M.D. on 03/03/2020 10:52 AM. Dictated by Ranee Gosselin, M.D. on 03/03/2020 10:50 AM.    No results found for this or any previous visit.    Results for orders placed during the hospital encounter of 03/02/22    T SPINE 3 VIEWS    Narrative  Exam: RIBS BILATERAL WO CHEST, T SPINE 3 VIEWS    CLINICAL INDICATION: 61 years Pleurodynia.    COMPARISON: None.    Impression  1.  No acute displaced rib fracture.    2.  No evidence of pleural effusion or pneumothorax.    3.  Mild-to-moderate multilevel thoracic spondylosis most pronounced at the mid thoracic spine. Sagittal alignment and vertebral body heights are grossly maintained.          Last Cr and LFT's:  Creatinine   Date Value Ref Range Status   03/08/2022 1.08 (H) 0.4 - 1.00 MG/DL Final     AST (SGOT)   Date Value Ref Range Status   03/08/2022 24 7 - 40 U/L Final     ALT (SGPT)   Date Value Ref Range Status   03/08/2022 19 7 - 56 U/L Final     Alk Phosphatase   Date Value Ref Range Status   03/08/2022 60 25 - 110 U/L Final     Total Bilirubin   Date Value Ref Range Status   03/08/2022 0.5 0.3 - 1.2 MG/DL Final            Assessment:  The pain complaints are most likely due to:  1. Neuroforaminal stenosis of lumbar spine    2. Lumbar radiculopathy    3. Cervical stenosis of spine    4. Lumbar facet arthropathy      Cindy Randolph is a 61 y.o. female who  has a past medical history of Acquired hypothyroidism, Anxiety (2016), Back pain, Breast cancer in female [facility]) (12/2020), Cancer of thyroid (HCC) (1980), Degenerative disc disease, cervical (1994), Degenerative disc disease, lumbar (2010), Degenerative disc disease, thoracic (2010), Depression (situational, transient), Essential hypertension (began in 3rd trimester most pregnancies), Essential hypertension, benign (as above), Generalized headaches (1996-2010), Heart murmur (at birth), Infection, Joint pain (1994), Limb alert care status, Other malignant neoplasm without specification of  site (thyroid, 1980), Spinal stenosis (2017 to present), Thyroid disorder (as noted), Torn meniscus (12/25/2020), Ulcer (1987), and Unspecified deficiency anemia (2009). who presents for evaluation of pain.    Plan:  1.  Lifestyle modification.  Continue current activities as tolerated.    2.  Medication.    -No changes in medication at this time.  3.  Therapy.    - Patient provided prescription for formalized physical therapy to work on cervical/lumbar stabilization exercises, soft tissue mobilization, stretching techniques, as well as modalities such as TENS unit, traction, therapeutic ultrasound, and Grasston technique.  4.  Interventions.  No interventions planned at this time.  Can consider lumbar or cervical epidural steroid injections, can also consider repeat lumbar radiofrequency ablation.  5.  Diagnostics.  No new imaging to be ordered at this time.  6.  Follow-up.  Patient will follow-up 6 weeks.    Risks/benefits of all pharmacologic and interventional treatments discussed and questions answered.     Thank you for this kind referral for consultation. Please feel free to contact me with any questions or concerns.

## 2022-03-20 DIAGNOSIS — M48061 Spinal stenosis, lumbar region without neurogenic claudication: Secondary | ICD-10-CM

## 2022-03-20 DIAGNOSIS — M546 Pain in thoracic spine: Secondary | ICD-10-CM

## 2022-03-20 DIAGNOSIS — M4802 Spinal stenosis, cervical region: Secondary | ICD-10-CM

## 2022-03-22 ENCOUNTER — Encounter: Admit: 2022-03-22 | Discharge: 2022-03-22 | Payer: MEDICARE

## 2022-03-22 ENCOUNTER — Ambulatory Visit: Admit: 2022-03-22 | Discharge: 2022-03-23 | Payer: MEDICARE

## 2022-03-22 DIAGNOSIS — C801 Malignant (primary) neoplasm, unspecified: Secondary | ICD-10-CM

## 2022-03-22 DIAGNOSIS — S83209A Unspecified tear of unspecified meniscus, current injury, unspecified knee, initial encounter: Secondary | ICD-10-CM

## 2022-03-22 DIAGNOSIS — IMO0002 Ulcer: Secondary | ICD-10-CM

## 2022-03-22 DIAGNOSIS — C73 Malignant neoplasm of thyroid gland: Secondary | ICD-10-CM

## 2022-03-22 DIAGNOSIS — B999 Unspecified infectious disease: Secondary | ICD-10-CM

## 2022-03-22 DIAGNOSIS — M549 Dorsalgia, unspecified: Secondary | ICD-10-CM

## 2022-03-22 DIAGNOSIS — M5030 Other cervical disc degeneration, unspecified cervical region: Secondary | ICD-10-CM

## 2022-03-22 DIAGNOSIS — R011 Cardiac murmur, unspecified: Secondary | ICD-10-CM

## 2022-03-22 DIAGNOSIS — M2550 Pain in unspecified joint: Secondary | ICD-10-CM

## 2022-03-22 DIAGNOSIS — D539 Nutritional anemia, unspecified: Secondary | ICD-10-CM

## 2022-03-22 DIAGNOSIS — L821 Other seborrheic keratosis: Secondary | ICD-10-CM

## 2022-03-22 DIAGNOSIS — E039 Hypothyroidism, unspecified: Secondary | ICD-10-CM

## 2022-03-22 DIAGNOSIS — D229 Melanocytic nevi, unspecified: Secondary | ICD-10-CM

## 2022-03-22 DIAGNOSIS — I10 Essential (primary) hypertension: Secondary | ICD-10-CM

## 2022-03-22 DIAGNOSIS — M4800 Spinal stenosis, site unspecified: Secondary | ICD-10-CM

## 2022-03-22 DIAGNOSIS — L578 Other skin changes due to chronic exposure to nonionizing radiation: Secondary | ICD-10-CM

## 2022-03-22 DIAGNOSIS — F419 Anxiety disorder, unspecified: Secondary | ICD-10-CM

## 2022-03-22 DIAGNOSIS — E079 Disorder of thyroid, unspecified: Secondary | ICD-10-CM

## 2022-03-22 DIAGNOSIS — M5136 Other intervertebral disc degeneration, lumbar region: Secondary | ICD-10-CM

## 2022-03-22 DIAGNOSIS — C50919 Malignant neoplasm of unspecified site of unspecified female breast: Secondary | ICD-10-CM

## 2022-03-22 DIAGNOSIS — F32A Depression: Secondary | ICD-10-CM

## 2022-03-22 DIAGNOSIS — L814 Other melanin hyperpigmentation: Secondary | ICD-10-CM

## 2022-03-22 DIAGNOSIS — M5134 Other intervertebral disc degeneration, thoracic region: Secondary | ICD-10-CM

## 2022-03-22 DIAGNOSIS — Z789 Other specified health status: Secondary | ICD-10-CM

## 2022-03-22 DIAGNOSIS — L301 Dyshidrosis [pompholyx]: Secondary | ICD-10-CM

## 2022-03-22 DIAGNOSIS — R519 Generalized headaches: Secondary | ICD-10-CM

## 2022-03-22 MED ORDER — CLOBETASOL 0.05 % TP OINT
Freq: Two times a day (BID) | TOPICAL | 3 refills | Status: AC
Start: 2022-03-22 — End: ?

## 2022-03-22 NOTE — Patient Instructions
Thank you for choosing [facility]. I am happy that you are allowing me to participate in your care.     A summary of the recommendations made at today's visit is below:  - Apply Clobetasol to affected areas on hands twice a day for no more than two weeks per month or until lesions resolve, may use 2-3 times per week as maintenance therapy. Avoid applying to face, groin, and axilla.     General recommendations  Applying creams/ointments  If you were prescribed a topical medication, a small amount will suffice. A line of cream/ointment placed on your fingertip to the first joint (where the fingertip bends) should be sufficient to cover a surface area of the size of the palm of your hand. You do not need a thick layer the prescription cream/ointment.     Your rash may return after application of the prescription topical medicine. Repeat the course of treatment as discussed in your visit with me. Please let me know if you don't get enough relief, the rash spreads, or if new or worsening symptoms develop.    Gentle skin care for everyday  - Bathe with lukewarm water, 5-10 minutes. Dry thoroughly afterward by patting yourself with a towel. Avoid heavy rubbing  - Use a mild soap, such as Dove, Cetaphil, or any fragrance-free product  - Apply a moisturizer of your choice as much as you can (there's no limit like in prescription topical medications), but at least 2 times a day (even if no bath is taken) and after every bath.  After the bath, pat dry and apply the moisturizer right away. Use a thick moisturizer that says cream or ointment (not lotion that comes in a pump bottle - this is not as moisturizing, but does work). The stickier the better (like Vaseline or Aquaphor)! However, find one that you like and will use.  - For your face, you may use a gentle cleanser (such as CeraVe or Cetaphil just as examples) or plain water, followed by a non-comedogenic (meaning it won't clog pores) moisturizer with sunscreen SPF 30 or higher. A mineral sunscreen with zinc, titanium, or iron oxide are preferable      Sun protection is KEY to success in taking care of your skin!  Using sunscreens and sun avoidance will decrease your risk of developing skin cancers like melanoma, as well as prevent developing signs of aging like wrinkles, redness, and brown/dark spots.  - Avoid the sun at peak hours of the day (10 AM - 4 PM)   - Wear sunscreen SPF 30 or above. It should be reapplied every 2 hours if possible, or when you wipe off with a towel after swimming  - Use sunprotective clothing (shirts with long sleeves, pants, and wide-brimmed cowboy style hat). Baseball caps don't work well enough!    ** FOR MOISTURIZERS AND SUNSCREENS: I RECOMMEND ANY PRODUCT THAT YOU LIKE AND WILL USE CONSISTENTLY -- ANY BRAND/PRICE IS ACCEPTABLE **    Moles and other brown spots   - Common melanocytic nevi, or moles, tend to be less than 6 mm in diameter (size of a pencil eraser) and symmetric with even pigmentation, round or oval shape, regular outline, and sharp, non-fuzzy border. They can stick out from the skin.  - Flaky, seemingly stuck-on brown bumps are usually benign/non-cancerous keratoses, not moles. You will likely get more of these with time.  - Bright red smooth bumps that do not bleed are usually benign blood vessel lesions (cherry angiomas), not  moles.  - It is normal to get new moles until the age of 3-45 years old.    Please contact me if you notice the following:  - A mole that differs from others (an ugly duckling that has an odd shape, uneven or uncertain border, different colors), or one that changes, bleeds, or itches.  - Any growth that has one or more of the:  A - Asymmetry. (Concerning if spot is not symmetric--one half is unlike the other half)  B - Border. (Irregular, notched, or poorly defined border are concerning)  C - Color. (Multiple colors [shades of tan, brown or black, or is sometimes white, red, or blue] or changes in color are concerning)  D - Diameter. (Larger than 6mm, i.e., a pencil eraser)  E - Evolution. (An evolving or changing spot, which looks different from others on your body. If there's a new itch, tenderness, or bleeding develop, these are concerning changes too.  - An uneven, new, or large (greater than 3mm) brown or black streak underneath a fingernail or toenail, which may or may not spread onto the skin next to the nail  - A sore that just won't heal after about 2-4 weeks    Contact info  Routine - Please don't hesitate to let me know if you have additional questions or concerns before your next visit. I recommend directing your inquiries through MyChart. However, if you do not have MyChart access or prefer calling, the phone number is [phone], Option 4.   Urgent - For urgent, after-hours/weekend issues that are not emergencies, please call the [facility] Operator at [phone]. Ask to have paged the dermatologist on call.  Emergency - Please call 911 or go to the nearest Emergency Department for all medical emergencies.    Zada Finders, MD MSc FAAD  Assistant Professor - Dermatology  Department of Internal Medicine  [facility]

## 2022-03-23 ENCOUNTER — Encounter: Admit: 2022-03-23 | Discharge: 2022-03-23 | Payer: MEDICARE

## 2022-03-23 MED ORDER — LETROZOLE 2.5 MG PO TAB
2.5 mg | ORAL_TABLET | Freq: Every day | ORAL | 3 refills
Start: 2022-03-23 — End: ?

## 2022-03-24 ENCOUNTER — Encounter: Admit: 2022-03-24 | Discharge: 2022-03-24 | Payer: MEDICARE

## 2022-03-24 ENCOUNTER — Ambulatory Visit: Admit: 2022-03-24 | Discharge: 2022-03-24 | Payer: MEDICARE

## 2022-03-24 DIAGNOSIS — C50112 Malignant neoplasm of central portion of left female breast: Secondary | ICD-10-CM

## 2022-03-24 DIAGNOSIS — R590 Localized enlarged lymph nodes: Secondary | ICD-10-CM

## 2022-03-24 DIAGNOSIS — R9389 Abnormal findings on diagnostic imaging of other specified body structures: Secondary | ICD-10-CM

## 2022-03-24 MED ORDER — LIDOCAINE 1% (BUFFERED) SYRINGE (BREAST CENTER)
1 mL | Freq: Once | INTRAMUSCULAR | 0 refills | Status: CP
Start: 2022-03-24 — End: ?

## 2022-03-24 MED ORDER — LIDOCAINE 1%-EPINEPHRINE 1:100000 (BUFFERED) VIAL (BREAST CENTER)
7 mL | Freq: Once | INTRAMUSCULAR | 0 refills | Status: CP
Start: 2022-03-24 — End: ?

## 2022-03-24 NOTE — Progress Notes
Cindy Randolph is here today for an US guided Right Axillary Lymph Node biopsy. Education and risk of procedure discussed.  Consent signed with Dr. Tamala Julian and breast marked per protocol.

## 2022-03-29 ENCOUNTER — Encounter: Admit: 2022-03-29 | Discharge: 2022-03-29 | Payer: MEDICARE

## 2022-04-06 ENCOUNTER — Encounter: Admit: 2022-04-06 | Discharge: 2022-04-06 | Payer: MEDICARE

## 2022-04-09 ENCOUNTER — Encounter: Admit: 2022-04-09 | Discharge: 2022-04-09 | Payer: MEDICARE

## 2022-04-12 ENCOUNTER — Encounter: Admit: 2022-04-12 | Discharge: 2022-04-12 | Payer: MEDICARE

## 2022-04-13 ENCOUNTER — Encounter: Admit: 2022-04-13 | Discharge: 2022-04-13 | Payer: MEDICARE

## 2022-04-14 ENCOUNTER — Encounter: Admit: 2022-04-14 | Discharge: 2022-04-14 | Payer: MEDICARE

## 2022-04-14 NOTE — Telephone Encounter
04/14/22 - Records requested from Dr Marcello Moores [phone] per Shirley Muscat note below lkg    --Collect records from Dr. Galvin Proffer Ph. [phone]--

## 2022-04-20 ENCOUNTER — Encounter: Admit: 2022-04-20 | Discharge: 2022-04-20 | Payer: MEDICARE

## 2022-04-21 ENCOUNTER — Encounter: Admit: 2022-04-21 | Discharge: 2022-04-21 | Payer: MEDICARE

## 2022-04-21 NOTE — Progress Notes
Oral Chemotherapy Pharmacist Note    Received notification from our specialty pharmacy that Cindy Randolph has been disenrolled from refill management program. Attempted to call patient which went straight to voicemail. Called her husband Cindy Randolph), who is a designated individual who has had patient permission to communicate information, to confirm we had correct phone number for [name]. He confirmed we had the correct number. He did report that her phone has been getting many spam calls, so she now blocks all spam calls, which likely is the reason why our calls go straight to voicemail.    Requested Cindy Randolph to notify Cindy Randolph to call [phone] ([place]) to re-establish communication, which he was agreeable to. She is currently on a Verzenio hold until next clinic visit in mid-December, so there is time. He thanked me for reaching out.     Walker Shadow, [facility]  Clinical Pharmacist  04/21/2022

## 2022-04-21 NOTE — Progress Notes
Contacted Cindy Randolph to refill their medication(s) VERZENIO 50 MG PO TAB.    We were unable to reach the patient after three attempts. At this time, we will no longer attempt to contact the patient for the refill. The patient may be re-enrolled in the pharmacy refill management program at any time by contacting the pharmacy or refilling their medication. Ambulatory pharmacist notified.    [place]  [phone]

## 2022-04-22 ENCOUNTER — Ambulatory Visit: Admit: 2022-04-22 | Discharge: 2022-04-22 | Payer: MEDICARE

## 2022-04-22 ENCOUNTER — Encounter: Admit: 2022-04-22 | Discharge: 2022-04-22 | Payer: MEDICARE

## 2022-04-22 DIAGNOSIS — M810 Age-related osteoporosis without current pathological fracture: Secondary | ICD-10-CM

## 2022-04-22 DIAGNOSIS — Z79811 Long term (current) use of aromatase inhibitors: Secondary | ICD-10-CM

## 2022-04-22 DIAGNOSIS — C50112 Malignant neoplasm of central portion of left female breast: Secondary | ICD-10-CM

## 2022-04-27 ENCOUNTER — Encounter: Admit: 2022-04-27 | Discharge: 2022-04-27 | Payer: MEDICARE

## 2022-04-27 DIAGNOSIS — M810 Age-related osteoporosis without current pathological fracture: Secondary | ICD-10-CM

## 2022-04-27 DIAGNOSIS — C50112 Malignant neoplasm of central portion of left female breast: Secondary | ICD-10-CM

## 2022-04-29 ENCOUNTER — Encounter: Admit: 2022-04-29 | Discharge: 2022-04-29 | Payer: MEDICARE

## 2022-04-30 ENCOUNTER — Ambulatory Visit: Admit: 2022-04-30 | Discharge: 2022-05-01 | Payer: MEDICARE

## 2022-04-30 ENCOUNTER — Encounter: Admit: 2022-04-30 | Discharge: 2022-04-30 | Payer: MEDICARE

## 2022-04-30 DIAGNOSIS — Z789 Other specified health status: Secondary | ICD-10-CM

## 2022-04-30 DIAGNOSIS — E039 Hypothyroidism, unspecified: Secondary | ICD-10-CM

## 2022-04-30 DIAGNOSIS — R519 Generalized headaches: Secondary | ICD-10-CM

## 2022-04-30 DIAGNOSIS — F32A Depression: Secondary | ICD-10-CM

## 2022-04-30 DIAGNOSIS — R011 Cardiac murmur, unspecified: Secondary | ICD-10-CM

## 2022-04-30 DIAGNOSIS — IMO0002 Ulcer: Secondary | ICD-10-CM

## 2022-04-30 DIAGNOSIS — M5136 Other intervertebral disc degeneration, lumbar region: Secondary | ICD-10-CM

## 2022-04-30 DIAGNOSIS — C73 Malignant neoplasm of thyroid gland: Secondary | ICD-10-CM

## 2022-04-30 DIAGNOSIS — D539 Nutritional anemia, unspecified: Secondary | ICD-10-CM

## 2022-04-30 DIAGNOSIS — M2550 Pain in unspecified joint: Secondary | ICD-10-CM

## 2022-04-30 DIAGNOSIS — I10 Essential (primary) hypertension: Secondary | ICD-10-CM

## 2022-04-30 DIAGNOSIS — S83209A Unspecified tear of unspecified meniscus, current injury, unspecified knee, initial encounter: Secondary | ICD-10-CM

## 2022-04-30 DIAGNOSIS — C50919 Malignant neoplasm of unspecified site of unspecified female breast: Secondary | ICD-10-CM

## 2022-04-30 DIAGNOSIS — F419 Anxiety disorder, unspecified: Secondary | ICD-10-CM

## 2022-04-30 DIAGNOSIS — B999 Unspecified infectious disease: Secondary | ICD-10-CM

## 2022-04-30 DIAGNOSIS — M549 Dorsalgia, unspecified: Secondary | ICD-10-CM

## 2022-04-30 DIAGNOSIS — M5030 Other cervical disc degeneration, unspecified cervical region: Secondary | ICD-10-CM

## 2022-04-30 DIAGNOSIS — M4800 Spinal stenosis, site unspecified: Secondary | ICD-10-CM

## 2022-04-30 DIAGNOSIS — M5134 Other intervertebral disc degeneration, thoracic region: Secondary | ICD-10-CM

## 2022-04-30 DIAGNOSIS — E079 Disorder of thyroid, unspecified: Secondary | ICD-10-CM

## 2022-04-30 DIAGNOSIS — C801 Malignant (primary) neoplasm, unspecified: Secondary | ICD-10-CM

## 2022-04-30 NOTE — Progress Notes
SPINE CENTER CLINIC NOTE       Cindy Randolph is a 61 y.o. female who  has a past medical history of Acquired hypothyroidism, Anxiety (2016), Back pain, Breast cancer in female [facility]) (12/2020), Cancer of thyroid (HCC) (1980), Degenerative disc disease, cervical (1994), Degenerative disc disease, lumbar (2010), Degenerative disc disease, thoracic (2010), Depression (situational, transient), Essential hypertension (began in 3rd trimester most pregnancies), Essential hypertension, benign (as above), Generalized headaches (1996-2010), Heart murmur (at birth), Infection, Joint pain (1994), Limb alert care status, Other malignant neoplasm without specification of site (thyroid, 1980), Spinal stenosis (2017 to present), Thyroid disorder (as noted), Torn meniscus (12/25/2020), Ulcer (1987), and Unspecified deficiency anemia (2009). who presents for follow up today. Last seen on 03/19/2022 for neck pain and lower leg radicular pain.  Plan at that time was to do formalized physical therapy for the neck and low back and the follow-up in 6 weeks.    SUBJECTIVE:   Since last visit, she is established with physical therapy and has been able to get up to once a week.  She has seen some benefit with physical therapy and has been doing some strengthening exercises.  However she feels that her neck pain has gotten worse.  She has tried to do a lot of activity modification and not lifting her heavy grandchild so much, but pain is really bothersome in her neck and radiates down to her shoulders.  Endorses paresthesias in her bilateral hands, with the thumb more affected than the pinky.  Her left side is worse than the right but she is unsure if this is related to her prior history of breast cancer on that side.  She has no recent imaging of her neck.  Her last MRI C-spine was in 2020 which showed multilevel degenerative changes with disc osteophyte complexes causing neural foraminal stenosis most prominent from levels C4 to C6. She has been in formalized physical therapy targeted towards her neck for over 6 weeks and her neck pain has worsened.  She has never had a spine injection targeted towards her neck. No history of eck surgery.    In regards to her low back, but physical therapy has been helpful.  They have been working on her latissimus dorsi and that pain has improved.  She has had lumbar epidurals in the past as well as a lumbar radiofrequency ablation with significant long-term benefit.           Current Outpatient Medications:     abemaciclib (VERZENIO) 50 mg tablet, Take one tablet by mouth twice daily., Disp: 56 tablet, Rfl: 3    acetaminophen (TYLENOL) 325 mg tablet, Take two tablets by mouth every 6 hours. Take scheduled for 3 days after surgery, then as needed. Do not exceed 4,000mg  in a 24 hour period., Disp: 40 tablet, Rfl: 0    anastrozole (ARIMIDEX) 1 mg tablet, Take one tablet by mouth daily for 360 days., Disp: 90 tablet, Rfl: 3    biotin 1 mg cap, Take  by mouth., Disp: , Rfl:     CALCIUM PO, Take  by mouth., Disp: , Rfl:     cholecalciferol (vitamin D3) (VITAMIN D3 PO), Take  by mouth., Disp: , Rfl:     clobetasoL (TEMOVATE) 0.05 % topical ointment, Apply  topically to affected area twice daily. Apply to areas of rash on hands twice daily; do NOT use on face/groin/underarms, use up to 2 weeks per month, Disp: 60 g, Rfl: 3    diclofenac sodium DR (  VOLTAREN) 50 mg tablet, Take one tablet by mouth as Needed. Take with food., Disp: , Rfl:     escitalopram oxalate (LEXAPRO) 20 mg tablet, Take one tablet by mouth daily., Disp: 90 tablet, Rfl: 3    letrozole (FEMARA) 2.5 mg tablet, Take one tablet by mouth daily., Disp: 90 tablet, Rfl: 3    lisinopriL (ZESTRIL) 2.5 mg tablet, Take one tablet by mouth daily., Disp: , Rfl:     methocarbamoL (ROBAXIN) 500 mg tablet, Take two tablets by mouth four times daily., Disp: 40 tablet, Rfl: 0    omeprazole DR (PRILOSEC) 40 mg capsule, Take one capsule by mouth daily., Disp: , Rfl: SYNTHROID 112 mcg tablet, Take one tablet by mouth daily 30 minutes before breakfast., Disp: 90 tablet, Rfl: 3    traMADoL (ULTRAM) 50 mg tablet, Take one tablet by mouth at bedtime as needed for Pain., Disp: 10 tablet, Rfl: 0    vitamins, B complex tab, Take one tablet by mouth three times weekly., Disp: , Rfl:     vitamins, multiple cap, Take one capsule by mouth daily., Disp: , Rfl:   Allergies   Allergen Reactions    Oxycodone NAUSEA ONLY     Prefers tramadol    Sudafed [Pseudoephedrine Hcl] PALPITATIONS     Physical Exam  Vitals:    04/30/22 1245   BP: (!) 142/84   BP Source: Arm, Left Upper   Pulse: 78   SpO2: 98%   PainSc: Five   Weight: 74 kg (163 lb 3.2 oz)   Height: 160 cm (5' 2.99)     Oswestry Total Score:: 48  Pain Score: Five  Body mass index is 28.92 kg/m?Marland Kitchen    PHYSICAL EXAM    Gen: NAD  HEENT: NCAT, anicteric sclera    Card: Extremities warm, well-perfused   Pulm: no distress, not cyanotic    Abd: soft, non-distended    Skin: Skin is warm and dry.  Psychiatric: normal mood and affect. Behavior is normal.     MSK:  No asymmetry of the paraspinals, scapula, or shoulders bilaterally. No muscular atrophy, deformity, edema or erythema. Poor posture with shoulders rounded, neck forward. No scapular winging at rest.     Tender to palpation over the cervical paraspinals, trapezius. Otherwise, no tenderness to palpation at the occiput, the cervical spinous processes, , the periscapular muscles (upper traps, levator scapulae, rhomboids, supraspinatus) bilaterally, the scapular spine, the clavicle, the AC joint, the anterolateral cuff/acromion, or the biceps tendon.    Manual Muscle Testing    Root R L   Shoulder Abduction C5 5 5   Elbow Flexion C5 5 5   Wrist Extension C6 5 5   Elbow Extension C7 5 5   Finger Flexion C8 5 5   Finger Abduction T1 5 5      ROM: cervical ROM limited with lateral flexion    Special Testing  Positive spurlings  Negative tinels at the elbow and wrist    Neuro:  CNII-XII grossly normal    Sensation: altered to light touch in C6-T1 dermatomes  Reflexes: 2+ and symmetric biceps, triceps, and brachioradialis. Negative Hoffman's.           MRI C-spine from 2020 showing multilevel degenerative changes in the neck, disc osteophyte complexes from C3-C6 causing moderate neuroforaminal narrowing at these levels.  There are some retrolisthesis of C4-C5.    IMPRESSION:  1. Cervical stenosis of spine    2. Cervical radiculopathy      She  is a pleasant 61 year old with history of left breast cancer status postresection, history of low back pain treated with lumbar epidurals and RFA in the past, who is presenting as a follow-up for neck and back pain.  She has had some improvement with physical therapy, however her neck pain is worse.  She does have some radicular pain into her shoulders and paresthesias into her bilateral hands    PLAN:    Lifestyle Modification: Recommend activity as tolerated.  Avoid provocative maneuvers. Keep spine in neutral position.  Interventions: Will schedule for cervical interlaminar epidural steroid injection at C7-T1 at first available appointment.  We will review her MRI C-spine prior to this injection  Medications: Continue current pharmacologic regimen.  She is currently taking oral diclofenac and Tylenol daily.  She takes very low-dose tramadol as needed for severe pain prescribed by an outside physician.  She was instructed to not take her diclofenac the day of the injection  Imaging: Dynamic cervical x-rays ordered today to assess for instability.  MRI C-spine ordered given that she has failed improvement with 6 weeks of conservative treatment including physical therapy  Continue PT/Exercise therapy as tolerated.   Follow up after procedure.     Lydia Guiles, MD  Interventional Spine and Musculoskeletal Fellow  PM&R    ATTESTATION    I personally performed the key portions of the E/M visit, discussed case with fellow and concur with fellow documentation of history, physical exam, assessment, and treatment plan unless otherwise noted.    Staff name:  Lizbeth Bark, MD Date:  04/30/2022

## 2022-05-01 DIAGNOSIS — M5412 Radiculopathy, cervical region: Secondary | ICD-10-CM

## 2022-05-01 DIAGNOSIS — M4802 Spinal stenosis, cervical region: Secondary | ICD-10-CM

## 2022-05-03 ENCOUNTER — Encounter: Admit: 2022-05-03 | Discharge: 2022-05-03 | Payer: MEDICARE

## 2022-05-03 DIAGNOSIS — M5136 Other intervertebral disc degeneration, lumbar region: Secondary | ICD-10-CM

## 2022-05-03 DIAGNOSIS — S83209A Unspecified tear of unspecified meniscus, current injury, unspecified knee, initial encounter: Secondary | ICD-10-CM

## 2022-05-03 DIAGNOSIS — I10 Essential (primary) hypertension: Secondary | ICD-10-CM

## 2022-05-03 DIAGNOSIS — E079 Disorder of thyroid, unspecified: Secondary | ICD-10-CM

## 2022-05-03 DIAGNOSIS — Z789 Other specified health status: Secondary | ICD-10-CM

## 2022-05-03 DIAGNOSIS — M5134 Other intervertebral disc degeneration, thoracic region: Secondary | ICD-10-CM

## 2022-05-03 DIAGNOSIS — M4800 Spinal stenosis, site unspecified: Secondary | ICD-10-CM

## 2022-05-03 DIAGNOSIS — M549 Dorsalgia, unspecified: Secondary | ICD-10-CM

## 2022-05-03 DIAGNOSIS — Z0000 Encounter for general adult medical examination without abnormal findings: Secondary | ICD-10-CM

## 2022-05-03 DIAGNOSIS — M5030 Other cervical disc degeneration, unspecified cervical region: Secondary | ICD-10-CM

## 2022-05-03 DIAGNOSIS — Z08 Encounter for follow-up examination after completed treatment for malignant neoplasm: Secondary | ICD-10-CM

## 2022-05-03 DIAGNOSIS — B999 Unspecified infectious disease: Secondary | ICD-10-CM

## 2022-05-03 DIAGNOSIS — E039 Hypothyroidism, unspecified: Secondary | ICD-10-CM

## 2022-05-03 DIAGNOSIS — M810 Age-related osteoporosis without current pathological fracture: Secondary | ICD-10-CM

## 2022-05-03 DIAGNOSIS — IMO0002 Ulcer: Secondary | ICD-10-CM

## 2022-05-03 DIAGNOSIS — C801 Malignant (primary) neoplasm, unspecified: Secondary | ICD-10-CM

## 2022-05-03 DIAGNOSIS — Z79811 Long term (current) use of aromatase inhibitors: Secondary | ICD-10-CM

## 2022-05-03 DIAGNOSIS — R519 Generalized headaches: Secondary | ICD-10-CM

## 2022-05-03 DIAGNOSIS — D539 Nutritional anemia, unspecified: Secondary | ICD-10-CM

## 2022-05-03 DIAGNOSIS — C50112 Malignant neoplasm of central portion of left female breast: Secondary | ICD-10-CM

## 2022-05-03 DIAGNOSIS — M2550 Pain in unspecified joint: Secondary | ICD-10-CM

## 2022-05-03 DIAGNOSIS — C73 Malignant neoplasm of thyroid gland: Secondary | ICD-10-CM

## 2022-05-03 DIAGNOSIS — Z9013 Acquired absence of bilateral breasts and nipples: Secondary | ICD-10-CM

## 2022-05-03 DIAGNOSIS — C50919 Malignant neoplasm of unspecified site of unspecified female breast: Secondary | ICD-10-CM

## 2022-05-03 DIAGNOSIS — Z9189 Other specified personal risk factors, not elsewhere classified: Secondary | ICD-10-CM

## 2022-05-03 DIAGNOSIS — R011 Cardiac murmur, unspecified: Secondary | ICD-10-CM

## 2022-05-03 DIAGNOSIS — F32A Depression: Secondary | ICD-10-CM

## 2022-05-03 DIAGNOSIS — F419 Anxiety disorder, unspecified: Secondary | ICD-10-CM

## 2022-05-03 MED ORDER — EXEMESTANE 25 MG PO TAB
25 mg | ORAL_TABLET | Freq: Every day | ORAL | 1 refills | Status: CN
Start: 2022-05-03 — End: ?

## 2022-05-03 NOTE — Progress Notes
Lymphedema Prevention Bioimpedance Spectroscopy (BIS) Monitoring    Notified patient via MyChart result was normal and to continue with routine follow up as scheduled.  Provided clinic contact information for any questions or concerns.     Reviewed BIS testing results from today.  Results:  LEFT  Current: 8.3  Baseline: 2.0  Change from Baseline: 6.3    WNL less than 3 standard deviation increase from baseline.      *Follow up BIS after returning to ALND precautions.

## 2022-05-06 ENCOUNTER — Encounter: Admit: 2022-05-06 | Discharge: 2022-05-06 | Payer: MEDICARE

## 2022-05-06 ENCOUNTER — Ambulatory Visit: Admit: 2022-05-06 | Discharge: 2022-05-06 | Payer: MEDICARE

## 2022-05-06 DIAGNOSIS — M4802 Spinal stenosis, cervical region: Secondary | ICD-10-CM

## 2022-05-11 ENCOUNTER — Encounter: Admit: 2022-05-11 | Discharge: 2022-05-11 | Payer: MEDICARE

## 2022-05-12 ENCOUNTER — Encounter: Admit: 2022-05-12 | Discharge: 2022-05-12 | Payer: MEDICARE

## 2022-05-12 ENCOUNTER — Ambulatory Visit: Admit: 2022-05-12 | Discharge: 2022-05-12 | Payer: MEDICARE

## 2022-05-12 DIAGNOSIS — E890 Postprocedural hypothyroidism: Secondary | ICD-10-CM

## 2022-05-12 LAB — TSH WITH FREE T4 REFLEX: TSH: 0.7 uU/mL (ref 0.35–5.00)

## 2022-05-13 ENCOUNTER — Encounter: Admit: 2022-05-13 | Discharge: 2022-05-13 | Payer: MEDICARE

## 2022-05-14 ENCOUNTER — Encounter: Admit: 2022-05-14 | Discharge: 2022-05-14 | Payer: MEDICARE

## 2022-05-25 ENCOUNTER — Ambulatory Visit: Admit: 2022-05-25 | Discharge: 2022-05-25 | Payer: MEDICARE

## 2022-05-25 DIAGNOSIS — K449 Diaphragmatic hernia without obstruction or gangrene: Secondary | ICD-10-CM

## 2022-05-25 DIAGNOSIS — K209 Esophagitis, unspecified without bleeding: Secondary | ICD-10-CM

## 2022-05-25 DIAGNOSIS — K219 Gastro-esophageal reflux disease without esophagitis: Secondary | ICD-10-CM

## 2022-05-31 ENCOUNTER — Encounter: Admit: 2022-05-31 | Discharge: 2022-05-31 | Payer: MEDICARE

## 2022-05-31 ENCOUNTER — Ambulatory Visit: Admit: 2022-05-31 | Discharge: 2022-05-31 | Payer: MEDICARE

## 2022-05-31 DIAGNOSIS — D539 Nutritional anemia, unspecified: Secondary | ICD-10-CM

## 2022-05-31 DIAGNOSIS — R011 Cardiac murmur, unspecified: Secondary | ICD-10-CM

## 2022-05-31 DIAGNOSIS — S83209A Unspecified tear of unspecified meniscus, current injury, unspecified knee, initial encounter: Secondary | ICD-10-CM

## 2022-05-31 DIAGNOSIS — I10 Essential (primary) hypertension: Secondary | ICD-10-CM

## 2022-05-31 DIAGNOSIS — M2550 Pain in unspecified joint: Secondary | ICD-10-CM

## 2022-05-31 DIAGNOSIS — IMO0002 Ulcer: Secondary | ICD-10-CM

## 2022-05-31 DIAGNOSIS — C50919 Malignant neoplasm of unspecified site of unspecified female breast: Secondary | ICD-10-CM

## 2022-05-31 DIAGNOSIS — M5134 Other intervertebral disc degeneration, thoracic region: Secondary | ICD-10-CM

## 2022-05-31 DIAGNOSIS — E039 Hypothyroidism, unspecified: Secondary | ICD-10-CM

## 2022-05-31 DIAGNOSIS — C801 Malignant (primary) neoplasm, unspecified: Secondary | ICD-10-CM

## 2022-05-31 DIAGNOSIS — C73 Malignant neoplasm of thyroid gland: Secondary | ICD-10-CM

## 2022-05-31 DIAGNOSIS — M5136 Other intervertebral disc degeneration, lumbar region: Secondary | ICD-10-CM

## 2022-05-31 DIAGNOSIS — M4800 Spinal stenosis, site unspecified: Secondary | ICD-10-CM

## 2022-05-31 DIAGNOSIS — S99921A Unspecified injury of right foot, initial encounter: Secondary | ICD-10-CM

## 2022-05-31 DIAGNOSIS — M5030 Other cervical disc degeneration, unspecified cervical region: Secondary | ICD-10-CM

## 2022-05-31 DIAGNOSIS — F32A Depression: Secondary | ICD-10-CM

## 2022-05-31 DIAGNOSIS — R519 Generalized headaches: Secondary | ICD-10-CM

## 2022-05-31 DIAGNOSIS — Z789 Other specified health status: Secondary | ICD-10-CM

## 2022-05-31 DIAGNOSIS — F419 Anxiety disorder, unspecified: Secondary | ICD-10-CM

## 2022-05-31 DIAGNOSIS — E079 Disorder of thyroid, unspecified: Secondary | ICD-10-CM

## 2022-05-31 DIAGNOSIS — M549 Dorsalgia, unspecified: Secondary | ICD-10-CM

## 2022-05-31 DIAGNOSIS — B999 Unspecified infectious disease: Secondary | ICD-10-CM

## 2022-05-31 NOTE — Progress Notes
Chief Complaint   Patient presents with    Right 5th Toe - New Patient     Hit on furniture yesterday.        History of Present Illness:   This is a 62 year old female presents today with right pinky toe pain.  The pain started last night when she banged her toe into a piece of hard furniture.  She feels like it is slightly deviated and has substantial bruising and swelling.  She has been using ice and takes diclofenac on a regular basis.  She wanted to get in to determine the extent of her injury and the treatment plan moving forward.  She denies any neurological symptoms or weakness.    The following portions of the patient's history were reviewed and updated as appropriate.  Past Medical History:     Medical History:   Diagnosis Date    Acquired hypothyroidism     Anxiety 2016    from pain    Back pain     Breast cancer in female [facility]) 12/2020    Left    Cancer of thyroid (HCC) 1980    Degenerative disc disease, cervical 1994    Degenerative disc disease, lumbar 2010    Degenerative disc disease, thoracic 2010    Depression situational, transient    after divorce, after death of 2nd husband    Essential hypertension began in 3rd trimester most pregnancies    remained after last pregnancy    Essential hypertension, benign as above    Generalized headaches 1996-2010    probably hormonally related    Heart murmur at birth    Dr. Maisie Fus said he didn't hear it a few years ago    Infection     Joint pain 1994    Limb alert care status     No access on R-arm    Other malignant neoplasm without specification of site thyroid, 1980    thyroidectomy    Spinal stenosis 2017 to present    per MRIs    Thyroid disorder as noted    Torn meniscus 12/25/2020    Ulcer 1987    with divorce; resolved    Unspecified deficiency anemia 2009    from excessive bleeding post-partum; treated iron       Past Surgical History:     Surgical History:   Procedure Laterality Date    HX WRIST FRACTURE SURGERY  1993    Baker's thumb with fixation ARTHROPLASTY  2001    L ACL    COLONOSCOPY  2017    normal    ARTHROSCOPY KNEE WITH PARTIAL LATERAL MENISCECTOMY AND LEFT KNEE INJECTION. Right 01/08/2021    Performed by Tanja Port, MD at [facility]    BILATERAL TOTAL MASTECTOMIES Bilateral 01/22/2021    Performed by Massie Kluver, MD at [facility]    INTRAOPERATIVE SENTINEL LYMPH NODE IDENTIFICATION WITH/ WITHOUT NON-RADIOACTIVE DYE INJECTION Left 01/22/2021    Performed by Massie Kluver, MD at [facility]    INJECTION RADIOACTIVE TRACER FOR SENTINEL NODE IDENTIFICATION Left 01/22/2021    Performed by Massie Kluver, MD at [facility]    LEFT AXILLARY SENTINEL LYMPH NODE BIOPSY Left 01/22/2021    Performed by Massie Kluver, MD at [facility]    BILATERAL CHEST FLAT CLOSURE Bilateral 01/22/2021    Performed by Stevenson Clinch, MD at [facility]    BILATERAL CHEST FLAT CLOSURE x 8 Bilateral 01/22/2021    Performed by  de Daine Gip, MD at [facility] OR    EXCISION BENIGN LESION 0.5 CM OR LESS - TORSO Right 01/22/2021    Performed by Stevenson Clinch, MD at [facility]    TUNNELED VENOUS PORT PLACEMENT Right 02/19/2021    Placement of port-a-cath - 85F Right 02/19/2021    Performed by Freund, Alecia Lemming., MD at [facility]    FLUOROSCOPIC GUIDANCE CENTRAL VENOUS ACCESS DEVICE PLACEMENT/ REPLACEMENT/ REMOVAL N/A 02/19/2021    Performed by Flo Shanks Alecia Lemming., MD at [facility] OR    Left Completion Axillary Lymph Node Dissection Left 05/14/2021    Performed by Massie Kluver, MD at [facility]    REMOVAL TUNNELED CENTRAL VENOUS ACCESS DEVICE INCLUDING PORT/ PUMP Right 05/14/2021    Performed by Massie Kluver, MD at [facility]    COLONOSCOPY DIAGNOSTIC WITH SPECIMEN COLLECTION BY BRUSHING/ WASHING - FLEXIBLE N/A 10/14/2021    Performed by Benetta Spar, MD at [facility]    KNEE SURGERY  L ACL as above    THYROIDECTOMY         Family History:  Family History   Problem Relation Age of Onset    Arthritis Paternal Grandmother         (osteoarthritis) multiple heberdens noduls and deformities    Back pain Paternal Grandmother     Arthritis Mother         wear and tear    Back pain Mother     Hypertension Mother     Joint Pain Mother     Neck Pain Mother     Cancer-Breast Mother 28        was on HRT for 10-15 years    Basal Cell Carcinoma Brother     Back pain Brother     Hypertension Brother     Basal Cell Carcinoma Brother     Back pain Brother         has had surgery    Hypertension Brother     Basal Cell Carcinoma Brother     Back pain Brother     Hypertension Brother     Diabetes Father         Type 2    Heart problem Father         CABG 3    Heart Disease Father     Hypertension Father     Diabetes Maternal Grandfather         Type 2    Birth Defect Daughter         PRS, congenital diaphragmatic hernia, malrotation, grey matter heterotopia    Stroke Maternal Uncle     Thyroid Disease Maternal Uncle     Diabetes Paternal Grandfather     Birth Defect Nephew         craniosynostosis    Cancer-Breast Maternal Great-Aunt 75     Social History:   Social History     Socioeconomic History    Marital status: Married   Occupational History    Occupation: retired Engineer, civil (consulting)   Tobacco Use    Smoking status: Never     Passive exposure: Past    Smokeless tobacco: Never   Vaping Use    Vaping Use: Never used   Substance and Sexual Activity    Alcohol use: Never     Comment: one per month beer    Drug use: Never     Comment: in late teens, early twenties, a  few times and not since    Sexual activity: Yes     Partners: Male     Birth control/protection: Post-menopausal, None       Current Medications: has a current medication list which includes the following prescription(s): verzenio, acetaminophen, anastrozole, biotin, calcium, cholecalciferol (vitamin d3), clobetasol, diclofenac sodium dr, escitalopram oxalate, letrozole, lisinopril, methocarbamol, omeprazole dr, synthroid, tramadol, vitamins, b complex, and vitamins, multiple.    Allergies: is allergic to oxycodone and sudafed [pseudoephedrine hcl].    Review of Systems:  Review of Systems  General Physical Exam:  General/Constitutional:No apparent distress: well-nourished and well developed.  Eyes: Sclera nonicteric, conjunctiva clear  Respiratory:No shortness of breath or dyspnea  Cardiac: No clubbing, cyanosis, or edema  Vascular: No edema, swelling or tenderness, except as noted in detailed exam.  Integumentary:No impressive skin lesions present, except as noted in detailed exam.  Neuro/Psych: Normal mood and affect, oriented to person, place and time.  Musculoskeletal: Normal, except as noted in detailed exam and in HPI.        Physical Exam  Vitals and nursing note reviewed.   BP 127/77  - Pulse 78  - Resp 18 Comment: stated - Ht 157.5 cm (5' 2)  - Wt 73.4 kg (161 lb 13.1 oz)  - SpO2 97%  - BMI 29.60 kg/m?   Constitutional:       Appearance: Normal appearance.   Pulmonary:      Effort: Pulmonary effort is normal.   Skin:     General: Skin is warm.   Neurological:      Mental Status: He is alert.   Psychiatric:         Mood and Affect: Mood normal.         Behavior: Behavior normal.         Thought Content: Thought content normal.         Judgment: Judgment normal.   Right pinky toe.  No signs or symptoms of infection, neurovascular intact  No obvious acute deformity present  Mild edema with moderate to severe ecchymosis throughout the toe  Mild to moderate pain with palpation over the toe  There is some very mild deviation away from the fourth toe but nothing significant enough that I would recommend a reduction of any sort.      Imaging:  Exam: FOOT COMP MIN 3 VIEWS RIGHT     CLINICAL INDICATION: 61 years to be seen in Same Day Clinic- PAIN in right foot     COMPARISON: None.     IMPRESSION        1.  Soft tissue thickening, increased attenuation, and swelling about the fifth toe, with poor evaluation of the proximal and distal phalanges due to positioning and angulation. A nondisplaced fracture is difficult to exclude and should be correlated with site tenderness to palpation.     2.  Mild scattered degenerative changes about the forefoot.     3.  Otherwise normal.         Finalized by Newman Nickels, M.D. on 05/31/2022 8:32 AM. Dictated by Newman Nickels, M.D. on 05/31/2022 8:30 AM.    Assessment and Plan:  At today's visit I saw the patient for right pinky toe injury.      I reviewed all available records and imaging in the room with the patient and discussed treatment options regarding the imaging results, symptoms, and physical exam.    Consult and treatment options (pharmaceuticals, formal PT, injections) were discussed with the patient and consistent  with their current condition.    Going to put her in a postop shoe as well as his give her body loops.  I will have her follow-up with Dr. Arlys John Vopat in about 4 weeks for reevaluation.    We discussed OTC nsaids (both oral and topical), tylenol, and ice/heat for the problem.      RICE: Rest, ice, compression, and elevation to help alleviate symptoms at this time.     Continue with activity as tolerated at this time letting pain be their guide.    All questions were answered at today's visit.            Total Time Today was 20 minutes in the following activities: Preparing to see the patient, Obtaining and/or reviewing separately obtained history, Performing a medically appropriate examination and/or evaluation, Counseling and educating the patient/family/caregiver, Ordering medications, tests, or procedures, Referring and communication with other health care professionals (when not separately reported), Documenting clinical information in the electronic or other health record, Independently interpreting results (not separately reported) and communicating results to the patient/family/caregiver and Care coordination (not separately reported)

## 2022-06-02 ENCOUNTER — Encounter: Admit: 2022-06-02 | Discharge: 2022-06-02 | Payer: MEDICARE

## 2022-06-02 DIAGNOSIS — C50112 Malignant neoplasm of central portion of left female breast: Secondary | ICD-10-CM

## 2022-06-02 DIAGNOSIS — M810 Age-related osteoporosis without current pathological fracture: Secondary | ICD-10-CM

## 2022-06-02 MED ORDER — EXEMESTANE 25 MG PO TAB
25 mg | ORAL_TABLET | Freq: Every day | ORAL | 11 refills | 90.00000 days | Status: AC
Start: 2022-06-02 — End: ?

## 2022-06-04 ENCOUNTER — Encounter: Admit: 2022-06-04 | Discharge: 2022-06-04 | Payer: MEDICARE

## 2022-06-04 ENCOUNTER — Ambulatory Visit: Admit: 2022-06-04 | Discharge: 2022-06-04 | Payer: MEDICARE

## 2022-06-04 DIAGNOSIS — M4802 Spinal stenosis, cervical region: Secondary | ICD-10-CM

## 2022-06-08 ENCOUNTER — Encounter: Admit: 2022-06-08 | Discharge: 2022-06-08 | Payer: MEDICARE

## 2022-06-10 ENCOUNTER — Encounter: Admit: 2022-06-10 | Discharge: 2022-06-10 | Payer: MEDICARE

## 2022-06-13 ENCOUNTER — Encounter: Admit: 2022-06-13 | Discharge: 2022-06-13 | Payer: MEDICARE

## 2022-06-17 ENCOUNTER — Encounter: Admit: 2022-06-17 | Discharge: 2022-06-17 | Payer: MEDICARE

## 2022-06-17 ENCOUNTER — Ambulatory Visit: Admit: 2022-06-17 | Discharge: 2022-06-17 | Payer: MEDICARE

## 2022-06-17 DIAGNOSIS — K219 Gastro-esophageal reflux disease without esophagitis: Secondary | ICD-10-CM

## 2022-06-17 DIAGNOSIS — M4800 Spinal stenosis, site unspecified: Secondary | ICD-10-CM

## 2022-06-17 DIAGNOSIS — D539 Nutritional anemia, unspecified: Secondary | ICD-10-CM

## 2022-06-17 DIAGNOSIS — C73 Malignant neoplasm of thyroid gland: Secondary | ICD-10-CM

## 2022-06-17 DIAGNOSIS — Z789 Other specified health status: Secondary | ICD-10-CM

## 2022-06-17 DIAGNOSIS — M549 Dorsalgia, unspecified: Secondary | ICD-10-CM

## 2022-06-17 DIAGNOSIS — M5030 Other cervical disc degeneration, unspecified cervical region: Secondary | ICD-10-CM

## 2022-06-17 DIAGNOSIS — M2550 Pain in unspecified joint: Secondary | ICD-10-CM

## 2022-06-17 DIAGNOSIS — R002 Palpitations: Secondary | ICD-10-CM

## 2022-06-17 DIAGNOSIS — M5136 Other intervertebral disc degeneration, lumbar region: Secondary | ICD-10-CM

## 2022-06-17 DIAGNOSIS — F32A Depression: Secondary | ICD-10-CM

## 2022-06-17 DIAGNOSIS — Z8249 Family history of ischemic heart disease and other diseases of the circulatory system: Secondary | ICD-10-CM

## 2022-06-17 DIAGNOSIS — K449 Diaphragmatic hernia without obstruction or gangrene: Secondary | ICD-10-CM

## 2022-06-17 DIAGNOSIS — F419 Anxiety disorder, unspecified: Secondary | ICD-10-CM

## 2022-06-17 DIAGNOSIS — IMO0002 Ulcer: Secondary | ICD-10-CM

## 2022-06-17 DIAGNOSIS — I10 Essential (primary) hypertension: Secondary | ICD-10-CM

## 2022-06-17 DIAGNOSIS — B999 Unspecified infectious disease: Secondary | ICD-10-CM

## 2022-06-17 DIAGNOSIS — E039 Hypothyroidism, unspecified: Secondary | ICD-10-CM

## 2022-06-17 DIAGNOSIS — C50919 Malignant neoplasm of unspecified site of unspecified female breast: Secondary | ICD-10-CM

## 2022-06-17 DIAGNOSIS — S83209A Unspecified tear of unspecified meniscus, current injury, unspecified knee, initial encounter: Secondary | ICD-10-CM

## 2022-06-17 DIAGNOSIS — E7800 Pure hypercholesterolemia, unspecified: Secondary | ICD-10-CM

## 2022-06-17 DIAGNOSIS — C801 Malignant (primary) neoplasm, unspecified: Secondary | ICD-10-CM

## 2022-06-17 DIAGNOSIS — R931 Abnormal findings on diagnostic imaging of heart and coronary circulation: Secondary | ICD-10-CM

## 2022-06-17 DIAGNOSIS — R011 Cardiac murmur, unspecified: Secondary | ICD-10-CM

## 2022-06-17 DIAGNOSIS — M5134 Other intervertebral disc degeneration, thoracic region: Secondary | ICD-10-CM

## 2022-06-17 DIAGNOSIS — E079 Disorder of thyroid, unspecified: Secondary | ICD-10-CM

## 2022-06-17 DIAGNOSIS — R519 Generalized headaches: Secondary | ICD-10-CM

## 2022-06-17 MED ORDER — PROPOFOL 10 MG/ML IV EMUL 20 ML (INFUSION)(AM)(OR)
INTRAVENOUS | 0 refills | Status: DC
Start: 2022-06-17 — End: 2022-06-17
  Administered 2022-06-17: 21:00:00 140 ug/kg/min via INTRAVENOUS

## 2022-06-17 MED ORDER — ONDANSETRON HCL (PF) 4 MG/2 ML IJ SOLN
INTRAVENOUS | 0 refills | Status: DC
Start: 2022-06-17 — End: 2022-06-17

## 2022-06-17 MED ORDER — PROPOFOL INJ 10 MG/ML IV VIAL
INTRAVENOUS | 0 refills | Status: DC
Start: 2022-06-17 — End: 2022-06-17

## 2022-06-17 MED ORDER — LIDOCAINE (PF) 20 MG/ML (2 %) IJ SOLN
INTRAVENOUS | 0 refills | Status: DC
Start: 2022-06-17 — End: 2022-06-17

## 2022-06-17 MED ADMIN — ACETAMINOPHEN 325 MG PO TAB [101]: 650 mg | ORAL | @ 22:00:00 | Stop: 2022-06-17 | NDC 00904677361

## 2022-06-17 MED ADMIN — LACTATED RINGERS IV SOLP [4318]: 1000.000 mL | INTRAVENOUS | @ 21:00:00 | Stop: 2022-06-18 | NDC 00338011704

## 2022-06-17 NOTE — Progress Notes
CARDIAC NEW PATIENT PROFILE      PHYSICIANS INFORMATION:   REFERRING PHYSICIAN: Erskine Emery, MD    PCP: Erskine Emery, MD    OTHER PROVIDERS: Joni Reining, MD ( Oncology); Rosalio Loud, MD (Endocrinology)    REASON FOR VISIT/DIAGNOSIS: Pt is a external referral from PCP Dr. Maisie Fus for palpitations, chest pain and family history of ischemic heart disease.     RECENT EVENTS/SYMPTOMS:   04/01/2022- Per Ov note Dr. Maisie Fus: Pt reports palpitations and heart burn and that she wants to get checked out due to a family history of coronary artery disease. Pt states that palpitations were so bad a few months ago that she couldn't walk into the house alone and needed help. Pt had a CT calcium score done recently that was 136. Pt is struggling with acid reflex as a side effect of her medication for breast cancer. Pt states chest pain issues 30 min's after taking the medication. Pt is wondering if she should see a cardiologist with her family history of coronary artery disease, a elevated calcium score and untreated high cholesterol.  PT denies heavy or pressure like chest pain and pain is not associated with shortness of breath or diaphoresis. Pt having intermittent palpitations on a daily basis but states have been occurring for a long time. Recommended 24 hour Holter, pt state she would prefer to wait till she sees cariology, referral to  cardiology. Dr discussed with patient that she needs to be on a statin given her family history, high cholesterol, and elevated coronary calcium score. Pt wanting to have apolipoprotein studies done and defer treatment with a statin.     PERTINENT CARDIAC HISTORY: palpitations, high cholesterol, hypertension     OTHER MEDICAL HISTORY: hiatal hernia, GERD, breast cancer, hypothyroidism, degenerative disc disease, lumber, thyroid cancer     MOST RECENT PERTINENT TESTING/PROCEDURES:   03/06/2022- CT Cardiac Calcium Score: Total calcium score is 136 which represents the 80th percentile rank for females age 32-60. A calcium score of 136 correlates with moderate calcification. There is minimal calcific atheromatous plaque in the visualized portions of the ascending and descending aorta. 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 Stable dilated ascending aorta measuring 4.1 cm, previously 4.1 cm.   02/13/21 -  2D + DOPPLER ECHO: Left Ventricle: The left ventricular size is normal. Concentric remodeling. The left ventricular systolic function is normal. The ejection fraction by Simpson's biplane method is 55%. The global longitudinal strain is -17%. There are no segmental wall motion abnormalities. Normal left ventricular diastolic function. Normal left atrial pressure.Right Ventricle: The right ventricular size is normal. The right ventricular systolic function is normal.Left Atrium: Normal size. Right Atrium: Normal size.No hemodynamically significant valvular abnormalities.The aortic root is normal in size. Mildly dilated ascending aorta.Estimated Peak Systolic PA Pressure 22 mmHg.Normal central venous pressure.    PERTINENT SURGERIES: mastectomy, thyroidectomy      PERTINENT CARDIAC FAMILY HISTORY: brother- coronary artery disease, hypertension; father- coronary artery disease, hypertension; mother- hypertension     MEDICATIONS/ALLERGIES: updated during recent internal OV 05/03/2022     LAB: TSH- 05/12/2022- CBC, CMP- 03/08/2022, Lipid- 02/15/2022 , apolipoprotein 04/01/2022     RECORDS: book marked     IMAGES: internal

## 2022-06-17 NOTE — Anesthesia Post-Procedure Evaluation
Post-Anesthesia Evaluation    Name: Cindy Randolph      MRN: [id number]     DOB: 12-22-1960     Age: 62 y.o.     Sex: female   __________________________________________________________________________     Procedure Information       Anesthesia Start Date/Time: 06/17/22 1452    Procedures:       ESOPHAGOGASTRODUODENOSCOPY WITH SPECIMEN COLLECTION BY BRUSHING/ WASHING      ESOPHAGOGASTRODUODENOSCOPY WITH DILATION ESOPHAGUS WITH BALLOON 30 MM OR GREATER - FLEXIBLE    Location: ENDO 6 (IR) / ENDO/GI    Surgeons: Jorje Guild, MD            Post-Anesthesia Vitals  BP: 137/86 (01/25 1600)  Pulse: 65 (01/25 1600)  Respirations: 15 PER MINUTE (01/25 1600)  SpO2: 96 % (01/25 1600)  O2 Device: None (Room air) (01/25 1600)   Vitals Value Taken Time   BP 137/86 06/17/22 1600   Temp 36.5 C (97.7 F) 06/17/22 1517   Pulse 65 06/17/22 1600   Respirations 15 PER MINUTE 06/17/22 1600   SpO2 96 % 06/17/22 1600   O2 Device None (Room air) 06/17/22 1600   ABP     ART BP           Post Anesthesia Evaluation Note    Evaluation location: Pre/Post  Patient participation: recovered; patient participated in evaluation  Level of consciousness: alert    Pain score: 0  Pain management: adequate    Hydration: normovolemia  Temperature: 36.0C - 38.4C  Airway patency: adequate    Perioperative Events       Post-op nausea and vomiting: no PONV    Postoperative Status  Cardiovascular status: hemodynamically stable  Respiratory status: spontaneous ventilation  Follow-up needed: none  Additional comments: Short run of aberrant rhythm noted in PACU, back to NSR.  Regular, P waves noted.  Appt. With cardiology noted Feb. 7th  EKG wnl  No CP, SOB noted  iSTAT wnl         Perioperative Events  There were no known complications for this encounter.

## 2022-06-17 NOTE — Anesthesia Pre-Procedure Evaluation
Anesthesia Pre-Procedure Evaluation    Name: Cindy Randolph      MRN: [id number]     DOB: Jan 01, 1961     Age: 62 y.o.     Sex: female   _________________________________________________________________________     Procedure Info:   Procedure Information       Date/Time: 06/17/22 1508    Procedure: ESOPHAGOGASTRODUODENOSCOPY WITH SPECIMEN COLLECTION BY BRUSHING/ WASHING    Location: ENDO 6 (IR) / ENDO/GI    Surgeons: Remigio Eisenmenger, MD            Physical Assessment  Vital Signs (last filed in past 24 hours):         Patient History   Allergies   Allergen Reactions    Oxycodone NAUSEA ONLY     Prefers tramadol    Sudafed [Pseudoephedrine Hcl] PALPITATIONS        Current Medications    Medication Directions   abemaciclib (VERZENIO) 50 mg tablet Take one tablet by mouth twice daily.   acetaminophen (TYLENOL) 325 mg tablet Take two tablets by mouth every 6 hours. Take scheduled for 3 days after surgery, then as needed. Do not exceed 4,000mg  in a 24 hour period.   biotin 1 mg cap Take  by mouth.   CALCIUM PO Take  by mouth.   cholecalciferol (vitamin D3) (VITAMIN D3 PO) Take  by mouth.   clobetasoL (TEMOVATE) 0.05 % topical ointment Apply  topically to affected area twice daily. Apply to areas of rash on hands twice daily; do NOT use on face/groin/underarms, use up to 2 weeks per month   diclofenac sodium DR (VOLTAREN) 50 mg tablet Take one tablet by mouth as Needed. Take with food.   escitalopram oxalate (LEXAPRO) 20 mg tablet Take one tablet by mouth daily.   exemestane (AROMASIN) 25 mg tablet Take one tablet by mouth daily. Take after a meal.   lisinopriL (ZESTRIL) 2.5 mg tablet Take one tablet by mouth daily.   methocarbamoL (ROBAXIN) 500 mg tablet Take two tablets by mouth four times daily.   omeprazole DR (PRILOSEC) 40 mg capsule Take one capsule by mouth daily.   SYNTHROID 112 mcg tablet Take one tablet by mouth daily 30 minutes before breakfast.   traMADoL (ULTRAM) 50 mg tablet Take one tablet by mouth at bedtime as needed for Pain.   vitamins, B complex tab Take one tablet by mouth three times weekly.   vitamins, multiple cap Take one capsule by mouth daily.         Review of Systems/Medical History      Patient summary reviewed  Nursing notes reviewed  Pertinent labs reviewed    PONV Screening: Non-smoker and Female sex    No history of anesthetic complications    No family history of anesthetic complications      Airway - negative        Pulmonary       Not a current smoker        No indications/hx of asthma      Cardiovascular       Recent diagnostic studies:          echocardiogram            TTE 01/2021:  ? Left Ventricle: The left ventricular size is normal. Concentric remodeling. The left ventricular systolic function is normal. The ejection fraction by Simpson's biplane method is 55%. The global longitudinal strain is -17%. There are no segmental wall motion abnormalities. Normal left ventricular diastolic function. Normal left  atrial pressure.  ? Right Ventricle: The right ventricular size is normal. The right ventricular systolic function is normal.  ? Left Atrium: Normal size. Right Atrium: Normal size.  ? No hemodynamically significant valvular abnormalities.  ? The aortic root is normal in size. Mildly dilated ascending aorta.  ? Estimated Peak Systolic PA Pressure 22 mmHg  ? Normal central venous pressure      Exercise tolerance: >4 METS      Beta Blocker therapy: No      Beta blockers within 24 hours: n/a      Hypertension, well controlled          No past MI      No coronary artery bypass graft        No PTCA          Hyperlipidemia              GI/Hepatic/Renal         Hiatal hernia        GERD      Peptic ulcer disease (h/o)        No liver disease:         No renal disease:         Nausea      No vomiting        Neuro/Psych       No seizures        No CVA      Headaches        Psychiatric history          Depression          Anxiety      Musculoskeletal         Neck pain      Back pain Arthritis:         Endocrine/Other       No diabetes        Hypothyroidism      No anemia        Malignancy (h/o breast and thyroid cancer):      Not obese      Constitution - negative       Physical Exam    Airway Findings      Mallampati: III      TM distance: >3 FB      Neck ROM: full      Mouth opening: good      Airway patency: adequate    Dental Findings: Negative      Cardiovascular Findings:       Rhythm: regular      Rate: normal      No murmur    Pulmonary Findings:       Breath sounds clear to auscultation. No decreased breath sounds.    Abdominal Findings:       Not obese    Neurological Findings:       Alert and oriented x 3    Normal mental status    Constitutional findings:       No acute distress       Diagnostic Tests  Hematology:   Lab Results   Component Value Date    HGB 12.7 03/08/2022    HCT 37.4 03/08/2022    PLTCT 216 03/08/2022    WBC 4.0 03/08/2022    NEUT 62 03/08/2022    ANC 2.50 03/08/2022    LYMPH 11 04/20/2021    ALC 0.90 03/08/2022    MONA 12 03/08/2022    AMC 0.50 03/08/2022  EOSA 3 03/08/2022    ABC 0.00 03/08/2022    BASOPHILS 2 04/20/2021    MCV 95.9 03/08/2022    MCH 32.5 03/08/2022    MCHC 33.9 03/08/2022    MPV 7.0 03/08/2022    RDW 13.5 03/08/2022         General Chemistry:   Lab Results   Component Value Date    NA 135 03/08/2022    K 4.4 03/08/2022    CL 99 03/08/2022    CO2 30 03/08/2022    GAP 6 03/08/2022    BUN 13 03/08/2022    CR 1.08 03/08/2022    GLU 73 03/08/2022    CA 9.6 03/08/2022    ALBUMIN 4.6 03/08/2022    TOTBILI 0.5 03/08/2022    PO4 3.9 03/08/2022      Coagulation: No results found for: PT, PTT, INR      Anesthesia Plan    ASA score: 2   Plan: MAC  Induction method: intravenous  NPO status: acceptable      Informed Consent  Anesthetic plan and risks discussed with patient.  Use of blood products discussed with patient  Blood Consent: consented      Plan discussed with: CRNA and anesthesiologist.    [facility] Plan  Alerts: No

## 2022-06-19 ENCOUNTER — Encounter: Admit: 2022-06-19 | Discharge: 2022-06-19 | Payer: MEDICARE

## 2022-06-19 DIAGNOSIS — M5030 Other cervical disc degeneration, unspecified cervical region: Secondary | ICD-10-CM

## 2022-06-19 DIAGNOSIS — M4800 Spinal stenosis, site unspecified: Secondary | ICD-10-CM

## 2022-06-19 DIAGNOSIS — E079 Disorder of thyroid, unspecified: Secondary | ICD-10-CM

## 2022-06-19 DIAGNOSIS — E039 Hypothyroidism, unspecified: Secondary | ICD-10-CM

## 2022-06-19 DIAGNOSIS — C801 Malignant (primary) neoplasm, unspecified: Secondary | ICD-10-CM

## 2022-06-19 DIAGNOSIS — C50919 Malignant neoplasm of unspecified site of unspecified female breast: Secondary | ICD-10-CM

## 2022-06-19 DIAGNOSIS — M5134 Other intervertebral disc degeneration, thoracic region: Secondary | ICD-10-CM

## 2022-06-19 DIAGNOSIS — K219 Gastro-esophageal reflux disease without esophagitis: Secondary | ICD-10-CM

## 2022-06-19 DIAGNOSIS — S83209A Unspecified tear of unspecified meniscus, current injury, unspecified knee, initial encounter: Secondary | ICD-10-CM

## 2022-06-19 DIAGNOSIS — D539 Nutritional anemia, unspecified: Secondary | ICD-10-CM

## 2022-06-19 DIAGNOSIS — F419 Anxiety disorder, unspecified: Secondary | ICD-10-CM

## 2022-06-19 DIAGNOSIS — B999 Unspecified infectious disease: Secondary | ICD-10-CM

## 2022-06-19 DIAGNOSIS — Z789 Other specified health status: Secondary | ICD-10-CM

## 2022-06-19 DIAGNOSIS — E7800 Pure hypercholesterolemia, unspecified: Secondary | ICD-10-CM

## 2022-06-19 DIAGNOSIS — R519 Generalized headaches: Secondary | ICD-10-CM

## 2022-06-19 DIAGNOSIS — IMO0002 Ulcer: Secondary | ICD-10-CM

## 2022-06-19 DIAGNOSIS — I10 Essential (primary) hypertension: Secondary | ICD-10-CM

## 2022-06-19 DIAGNOSIS — M5136 Other intervertebral disc degeneration, lumbar region: Secondary | ICD-10-CM

## 2022-06-19 DIAGNOSIS — R011 Cardiac murmur, unspecified: Secondary | ICD-10-CM

## 2022-06-19 DIAGNOSIS — F32A Depression: Secondary | ICD-10-CM

## 2022-06-19 DIAGNOSIS — M2550 Pain in unspecified joint: Secondary | ICD-10-CM

## 2022-06-19 DIAGNOSIS — M549 Dorsalgia, unspecified: Secondary | ICD-10-CM

## 2022-06-19 DIAGNOSIS — C73 Malignant neoplasm of thyroid gland: Secondary | ICD-10-CM

## 2022-06-21 ENCOUNTER — Ambulatory Visit: Admit: 2022-06-21 | Discharge: 2022-06-22 | Payer: MEDICARE

## 2022-06-21 DIAGNOSIS — M5412 Radiculopathy, cervical region: Secondary | ICD-10-CM

## 2022-06-21 DIAGNOSIS — M4802 Spinal stenosis, cervical region: Secondary | ICD-10-CM

## 2022-06-21 NOTE — Progress Notes
Obtained patient's verbal consent to treat them and their agreement to [facility] financial policy and NPP via this telehealth visit during the [facility]      SPINE CENTER CLINIC NOTE       SUBJECTIVE: 62 year old female presenting in follow-up from last visit on 04/30/2022.  At that time she was diagnosed with cervical stenosis, cervical radiculopathy.  We ordered MRI C-spine as she had previously failed physical therapy and medication.  We also scheduled a cervical epidural steroid injection at the same time, she is scheduled to get the injection in about 2 weeks.  The MRI was done 06/04/2022 and showed chronic cervical spondylosis with marked multilevel disc degeneration greatest at C4-5, 5 6 and 6 7.  Multilevel central spinal stenosis, greatest at C5-6 where it is moderate to marked.  Multilevel neuroforaminal stenosis.  No abnormal cord signal and no evidence of metastatic disease.  She is reporting some progress with the physical therapy but still having pretty significant neck and arm pain numbness and tingling.  She is interested in next step of therapy.         Review of Systems    Current Outpatient Medications:     abemaciclib (VERZENIO) 50 mg tablet, Take one tablet by mouth twice daily., Disp: 56 tablet, Rfl: 3    acetaminophen (TYLENOL) 325 mg tablet, Take two tablets by mouth every 6 hours. Take scheduled for 3 days after surgery, then as needed. Do not exceed 4,000mg  in a 24 hour period., Disp: 40 tablet, Rfl: 0    biotin 1 mg cap, Take  by mouth., Disp: , Rfl:     CALCIUM PO, Take  by mouth., Disp: , Rfl:     cholecalciferol (vitamin D3) (VITAMIN D3 PO), Take  by mouth., Disp: , Rfl:     clobetasoL (TEMOVATE) 0.05 % topical ointment, Apply  topically to affected area twice daily. Apply to areas of rash on hands twice daily; do NOT use on face/groin/underarms, use up to 2 weeks per month, Disp: 60 g, Rfl: 3    diclofenac sodium DR (VOLTAREN) 50 mg tablet, Take one tablet by mouth as Needed. Take with food., Disp: , Rfl:     escitalopram oxalate (LEXAPRO) 20 mg tablet, Take one tablet by mouth daily., Disp: 90 tablet, Rfl: 3    exemestane (AROMASIN) 25 mg tablet, Take one tablet by mouth daily. Take after a meal., Disp: 30 tablet, Rfl: 11    lisinopriL (ZESTRIL) 2.5 mg tablet, Take one tablet by mouth daily., Disp: , Rfl:     methocarbamoL (ROBAXIN) 500 mg tablet, Take two tablets by mouth four times daily., Disp: 40 tablet, Rfl: 0    omeprazole DR (PRILOSEC) 40 mg capsule, Take one capsule by mouth daily., Disp: , Rfl:     SYNTHROID 112 mcg tablet, Take one tablet by mouth daily 30 minutes before breakfast., Disp: 90 tablet, Rfl: 3    traMADoL (ULTRAM) 50 mg tablet, Take one tablet by mouth at bedtime as needed for Pain., Disp: 10 tablet, Rfl: 0    vitamins, B complex tab, Take one tablet by mouth three times weekly., Disp: , Rfl:     vitamins, multiple cap, Take one capsule by mouth daily., Disp: , Rfl:   Allergies   Allergen Reactions    Oxycodone NAUSEA ONLY     Prefers tramadol    Sudafed [Pseudoephedrine Hcl] PALPITATIONS     Physical Exam  There were no vitals filed for this visit.  Oswestry  Total Score:: 44     There is no height or weight on file to calculate BMI.  This visit was conducted by means of a two-way synchronous audio-video connection    General: 62 y.o. year old who appears stated age, in no acute distress  Behavior: cooperative, fully engaged, good eye contact  Speech: normal volume, rate and tone  Mood: Euthymic, no SI or HI verbalized at this time  Affect: Congruent to mood and the context of the discussion  Orientation: Alert and oriented  HEENT: Normocephalic, atraumatic  Resp: Unlabored respirations, no audible cough or wheezing  Extremities: No cyanosis, clubbing, or edema  Skin: No visible rashes, wounds, or ecchymosis   Musculoskeletal: Full functional ROM, no gross deformities   Gait: Able to walk without assistive device, able to heel and toe walk  Neurologic: Sensation grossly intact per patient in C3-T1 dermatomes   Cranial nerves:  CN II - pupils equal, round    CN III, IV, VI - EOMI, no evidence of nystagmus, no ptosis, pupils are symmetric  CN VII - no facial droop/asymmetry, smile symmetric  CN VIII - hearing intact to normal voice  CN IX, X - no hoarseness/nasal voice  CN XII - tongue midline           IMPRESSION:  1. Cervical stenosis of spine    2. Cervical radiculopathy          PLAN: Plan is to keep the cervical epidural steroid injection as scheduled.  Depending on how she does with this we can consider medial branch blocks with hopes of progression to radiofrequency ablation in future.         Total time 15 minutes.  Estimated counseling time 10 minutes.

## 2022-06-21 NOTE — Patient Instructions
Dr. Lowry Ram nurse is [facility], Therapist, sports.  Their phone number is [phone]. Please leave your name, the spelling of your last name, date of birth, phone number they can call you back and a description of why you are calling.  They will return your call as soon as possible.  If you need to send them a fax, Their fax number is [phone].

## 2022-06-22 ENCOUNTER — Encounter: Admit: 2022-06-22 | Discharge: 2022-06-22 | Payer: MEDICARE

## 2022-06-22 ENCOUNTER — Ambulatory Visit: Admit: 2022-06-22 | Discharge: 2022-06-23 | Payer: MEDICARE

## 2022-06-22 DIAGNOSIS — D539 Nutritional anemia, unspecified: Secondary | ICD-10-CM

## 2022-06-22 DIAGNOSIS — R011 Cardiac murmur, unspecified: Secondary | ICD-10-CM

## 2022-06-22 DIAGNOSIS — E079 Disorder of thyroid, unspecified: Secondary | ICD-10-CM

## 2022-06-22 DIAGNOSIS — C50112 Malignant neoplasm of central portion of left female breast: Secondary | ICD-10-CM

## 2022-06-22 DIAGNOSIS — Z923 Personal history of irradiation: Secondary | ICD-10-CM

## 2022-06-22 DIAGNOSIS — M5030 Other cervical disc degeneration, unspecified cervical region: Secondary | ICD-10-CM

## 2022-06-22 DIAGNOSIS — E890 Postprocedural hypothyroidism: Secondary | ICD-10-CM

## 2022-06-22 DIAGNOSIS — Z85850 Personal history of malignant neoplasm of thyroid: Secondary | ICD-10-CM

## 2022-06-22 DIAGNOSIS — C50919 Malignant neoplasm of unspecified site of unspecified female breast: Secondary | ICD-10-CM

## 2022-06-22 DIAGNOSIS — F32A Depression: Secondary | ICD-10-CM

## 2022-06-22 DIAGNOSIS — Z8249 Family history of ischemic heart disease and other diseases of the circulatory system: Secondary | ICD-10-CM

## 2022-06-22 DIAGNOSIS — I77810 Thoracic aortic ectasia: Secondary | ICD-10-CM

## 2022-06-22 DIAGNOSIS — W19XXXA Unspecified fall, initial encounter: Secondary | ICD-10-CM

## 2022-06-22 DIAGNOSIS — S83209A Unspecified tear of unspecified meniscus, current injury, unspecified knee, initial encounter: Secondary | ICD-10-CM

## 2022-06-22 DIAGNOSIS — M5134 Other intervertebral disc degeneration, thoracic region: Secondary | ICD-10-CM

## 2022-06-22 DIAGNOSIS — M4800 Spinal stenosis, site unspecified: Secondary | ICD-10-CM

## 2022-06-22 DIAGNOSIS — E039 Hypothyroidism, unspecified: Secondary | ICD-10-CM

## 2022-06-22 DIAGNOSIS — I10 Essential (primary) hypertension: Secondary | ICD-10-CM

## 2022-06-22 DIAGNOSIS — C801 Malignant (primary) neoplasm, unspecified: Secondary | ICD-10-CM

## 2022-06-22 DIAGNOSIS — M810 Age-related osteoporosis without current pathological fracture: Secondary | ICD-10-CM

## 2022-06-22 DIAGNOSIS — E7800 Pure hypercholesterolemia, unspecified: Secondary | ICD-10-CM

## 2022-06-22 DIAGNOSIS — M5136 Other intervertebral disc degeneration, lumbar region: Secondary | ICD-10-CM

## 2022-06-22 DIAGNOSIS — R32 Unspecified urinary incontinence: Secondary | ICD-10-CM

## 2022-06-22 DIAGNOSIS — M2550 Pain in unspecified joint: Secondary | ICD-10-CM

## 2022-06-22 DIAGNOSIS — Z7689 Persons encountering health services in other specified circumstances: Secondary | ICD-10-CM

## 2022-06-22 DIAGNOSIS — F419 Anxiety disorder, unspecified: Secondary | ICD-10-CM

## 2022-06-22 DIAGNOSIS — C73 Malignant neoplasm of thyroid gland: Secondary | ICD-10-CM

## 2022-06-22 DIAGNOSIS — F3342 Major depressive disorder, recurrent, in full remission: Secondary | ICD-10-CM

## 2022-06-22 DIAGNOSIS — J701 Chronic and other pulmonary manifestations due to radiation: Secondary | ICD-10-CM

## 2022-06-22 DIAGNOSIS — M5412 Radiculopathy, cervical region: Secondary | ICD-10-CM

## 2022-06-22 DIAGNOSIS — K219 Gastro-esophageal reflux disease without esophagitis: Secondary | ICD-10-CM

## 2022-06-22 DIAGNOSIS — R519 Generalized headaches: Secondary | ICD-10-CM

## 2022-06-22 DIAGNOSIS — M549 Dorsalgia, unspecified: Secondary | ICD-10-CM

## 2022-06-22 DIAGNOSIS — Z0000 Encounter for general adult medical examination without abnormal findings: Secondary | ICD-10-CM

## 2022-06-22 DIAGNOSIS — Z9013 Acquired absence of bilateral breasts and nipples: Secondary | ICD-10-CM

## 2022-06-22 DIAGNOSIS — Z789 Other specified health status: Secondary | ICD-10-CM

## 2022-06-22 DIAGNOSIS — IMO0002 Ulcer: Secondary | ICD-10-CM

## 2022-06-22 DIAGNOSIS — B999 Unspecified infectious disease: Secondary | ICD-10-CM

## 2022-06-22 MED ORDER — LISINOPRIL 5 MG PO TAB
5 mg | ORAL_TABLET | Freq: Every day | ORAL | 1 refills | Status: AC
Start: 2022-06-22 — End: ?

## 2022-06-22 MED ORDER — SUMATRIPTAN SUCCINATE 25 MG PO TAB
ORAL_TABLET | SUBCUTANEOUS | 0 refills | 30.00000 days | Status: AC
Start: 2022-06-22 — End: ?

## 2022-06-22 MED ORDER — TRAMADOL 25 MG PO TAB
25 mg | ORAL_TABLET | Freq: Two times a day (BID) | ORAL | 0 refills | Status: AC | PRN
Start: 2022-06-22 — End: ?

## 2022-06-22 MED ORDER — EXEMESTANE 25 MG PO TAB
25 mg | ORAL_TABLET | Freq: Every day | ORAL | 11 refills | 90.00000 days | Status: AC
Start: 2022-06-22 — End: ?

## 2022-06-22 NOTE — Assessment & Plan Note
Follows with spine center  Currently receives cervical epidural steroid injections

## 2022-06-22 NOTE — Assessment & Plan Note
Currently on IV zoledronic acid therapy managed by oncology  Has repeat BMP due in 03/2023

## 2022-06-22 NOTE — Assessment & Plan Note
Has maybe 2 episodes a year; sometimes has aphasia with episodes that lasts 2 hours and resolves  Takes benadryl and motrin  Prescribed sumatriptan as needed

## 2022-06-22 NOTE — Progress Notes
Answers submitted by the patient for this visit:  Other Symptom Questionnaire (Submitted on 06/21/2022)  Chief Complaint: Patient Reported Other  What topic(s) would you like to cover during your appointment?: Medical conditions increasing in complexity with age, especially with breast cancer dx and all of the side effects.  Please describe the issue(s) and history with the issue (location, severity, duration, symptoms, etc.). : Since breast cancer and 62 years old: esophagitis, worsening cervical spine stenosis, worsening lumbar spine stenosis and degeneration, osteoporosis, severe migraine post-zolodronic acid IV in June 23, coronary calcium score od 136, palpitations, sliding type 3cm hiatal hernia with reflux, right knee meniscotomy. Started Lexapro for anxiety and sadness, with good results. Need a PCP in the [place] system to help manage my care and advise me; moved to [facility] in June 2023.  What has been done so far to take care of the issue(s)?: Many new specialists.  What are your goals for this visit?: See if Dr. Earlie Server can help me coordinate my care, help me to make informed decisions, and be my PCP.

## 2022-06-22 NOTE — Assessment & Plan Note
Noted on CT Chest from 03/06/2022  Will continue to monitor

## 2022-06-22 NOTE — Progress Notes
06/22/2022     Subjective:       Patient Reported Other  What topic(s) would you like to cover during your appointment?:  Medical conditions increasing in complexity with age, especially with breast cancer dx and all of the side effects.  Please describe the issue(s) and history with the issue (location, severity, duration, symptoms, etc.).:  Since breast cancer and 62 years old: esophagitis, worsening cervical spine stenosis, worsening lumbar spine stenosis and degeneration, osteoporosis, severe migraine post-zolodronic acid IV in June ?23, coronary calcium score od 136, palpitations, sliding type 3cm hiatal hernia with reflux, right knee meniscotomy. Started Lexapro for anxiety and sadness, with good results. Need a PCP in the [facility] system to help manage my care and advise me; moved to [facility] in June 2023.  What has been done so far to take care of the issue(s)?:  Many new specialists.  What are your goals for this visit?:  See if Dr. Shirline Frees can help me coordinate my care, help me to make informed decisions, and be my PCP.    Cindy Randolph is a 62 y.o. female.  Establish Care     Presents to establish care. Has history of left breast cancer status post mastectomy in 2022, cervical spine radiculopathy, lumbar degenerative disease, osteoporosis, migraine and depression.    Follows with spine center for management of cervical and lumbar degenerative disease.  Currently receives epidural steroid injections.    Takes benadryl and motrin for migraines. Had aphasia lasting 2 hours when she had previous episodes of migraines. However has not noted any aphasia symptoms outside of having a migraine.    Follows with oncology for management of left breast cancer.  Underwent bilateral mastectomy on 01/22/2021.  Had left axillary lymph node dissection on 05/14/2021 which showed metastatic focus.  Completed radiation therapy on 07/01/2021. Was originally on Verzenio but developed fatigued, dizzy, coughing and gastritis. Currently on exemestane alone.    Has history of osteoporosis diagnosed on bone density testing on 04/22/2021.  Currently on IV zoledronic acid.  Repeat BMD due in 03/2023.    Currently on Lexapro 15 mg daily for anxiety and depression.  Follows endocrinology for management of hypothyroidism.    Has pain over multiple joints including wrists, hands, shoulder, lower back and knees. Currently     Moved from [place], [place]. Originally from [place] 20 years ago. Has 8 children.  Late husband was a primary care physician. She is a retired Engineer, civil (consulting).           Medical History:   Diagnosis Date    Accidental fall 02/23/22    Knee gave out and fell down stairs    Acquired hypothyroidism     Anxiety 2016    from pain    Back pain     Breast cancer in female [facility]) 12/2020    Left    Cancer of thyroid (HCC) 1980    Degenerative disc disease, cervical 1994    Degenerative disc disease, lumbar 2010    Degenerative disc disease, thoracic 2010    Depression situational, transient    after divorce, after death of 2nd husband    Essential hypertension began in 3rd trimester most pregnancies    remained after last pregnancy    Essential hypertension, benign as above    Family history of coronary artery disease in brother 06/17/2022    04/01/2022- Per OV Dr. Maisie Fus     Generalized headaches 1996-2010    probably hormonally related  GERD (gastroesophageal reflux disease)     H/O total thyroidectomy 05/24/1978    Heart murmur at birth    Dr. Maisie Fus said he didn't hear it a few years ago    High cholesterol     History of bilateral mastectomy 06/22/2022    History of external beam radiation therapy 07/15/2021    History of thyroid cancer 06/22/2022    TSH 0.39 and pt not taking med regularly until about 2 weeks ago.  Will have her repeat TSH in 3 weeks and f/u after.  Needs to be between 0.1 and 0.3.  Will refer her back to Endo for f/u.    Incontinence 3/23    With coughing and sneezing and strong sudden mivemwnts    Infection Joint pain 1994    Limb alert care status     No access on R-arm    Osteoporosis 03/2021    Bilat hips    Other malignant neoplasm without specification of site thyroid, 1980    thyroidectomy    Spinal stenosis 2017 to present    per MRIs    Thyroid disorder as noted    Torn meniscus 12/25/2020    Ulcer 1987    with divorce; resolved    Unspecified deficiency anemia 2009    from excessive bleeding post-partum; treated iron     Surgical History:   Procedure Laterality Date    HX WRIST FRACTURE SURGERY  1993    Baker's thumb with fixation    ARTHROPLASTY  2001    L ACL    COLONOSCOPY  2017    normal    ARTHROSCOPY KNEE WITH PARTIAL LATERAL MENISCECTOMY AND LEFT KNEE INJECTION. Right 01/08/2021    Performed by Tanja Port, MD at [facility] OR    BILATERAL TOTAL MASTECTOMIES Bilateral 01/22/2021    Performed by Massie Kluver, MD at [facility]    INTRAOPERATIVE SENTINEL LYMPH NODE IDENTIFICATION WITH/ WITHOUT NON-RADIOACTIVE DYE INJECTION Left 01/22/2021    Performed by Massie Kluver, MD at [facility]    INJECTION RADIOACTIVE TRACER FOR SENTINEL NODE IDENTIFICATION Left 01/22/2021    Performed by Massie Kluver, MD at [facility]    LEFT AXILLARY SENTINEL LYMPH NODE BIOPSY Left 01/22/2021    Performed by Massie Kluver, MD at [facility]    BILATERAL CHEST FLAT CLOSURE Bilateral 01/22/2021    Performed by Stevenson Clinch, MD at [facility] OR    BILATERAL CHEST FLAT CLOSURE x 8 Bilateral 01/22/2021    Performed by Stevenson Clinch, MD at [facility]    EXCISION BENIGN LESION 0.5 CM OR LESS - TORSO Right 01/22/2021    Performed by Stevenson Clinch, MD at [facility]    TUNNELED VENOUS PORT PLACEMENT Right 02/19/2021    Placement of port-a-cath - 50F Right 02/19/2021    Performed by Freund, Alecia Lemming., MD at [facility]    FLUOROSCOPIC GUIDANCE CENTRAL VENOUS ACCESS DEVICE PLACEMENT/ REPLACEMENT/ REMOVAL N/A 02/19/2021    Performed by Flo Shanks Alecia Lemming., MD at [facility] OR    Left Completion Axillary Lymph Node Dissection Left 05/14/2021 Performed by Massie Kluver, MD at [facility]    REMOVAL TUNNELED CENTRAL VENOUS ACCESS DEVICE INCLUDING PORT/ PUMP Right 05/14/2021    Performed by Massie Kluver, MD at [facility]    COLONOSCOPY DIAGNOSTIC WITH SPECIMEN COLLECTION BY BRUSHING/ WASHING - FLEXIBLE N/A 10/14/2021    Performed by Benetta Spar, MD  at [facility]    ESOPHAGOGASTRODUODENOSCOPY WITH SPECIMEN COLLECTION BY BRUSHING/ WASHING N/A 06/17/2022    Performed by Remigio Eisenmenger, MD at [facility]    ESOPHAGOGASTRODUODENOSCOPY WITH DILATION ESOPHAGUS WITH BALLOON 30 MM OR GREATER - FLEXIBLE N/A 06/17/2022    Performed by Remigio Eisenmenger, MD at [facility]    ECHOCARDIOGRAM PROCEDURE      FRACTURE SURGERY  1993    ORIF L 1st metacarpal    KNEE SURGERY  L ACL as above    THYROIDECTOMY       Family History   Problem Relation Age of Onset    Arthritis Paternal Grandmother         multiple heberdens nodules and deformities    Back pain Paternal Grandmother     Arthritis-osteo Paternal Grandmother     Diabetes Paternal Grandmother         Type 2 as older adult    Arthritis Mother         wear and tear    Back pain Mother     Hypertension Mother     Joint Pain Mother     Neck Pain Mother     Cancer-Breast Mother 7        was on HRT for 10-15 years    Cancer Mother         Breast CA, post menopausal, estrogen-sensitive    Miscarriages Mother         3-5 and a stillbirth    Basal Cell Carcinoma Brother     Back pain Brother     Hypertension Brother     Asthma Brother         Worst when a child    Basal Cell Carcinoma Brother     Back pain Brother         has had surgery    Hypertension Brother     Basal Cell Carcinoma Brother     Back pain Brother     Hypertension Brother     Diabetes Father         Type 2    Heart problem Father         CABG 3    Heart Disease Father     Hypertension Father     Alcohol abuse Father     Diabetes Maternal Grandfather         Type 2 as older adult    Birth Defect Daughter         PRS, congenital diaphragmatic hernia, malrotation, grey matter heterotopia    Stroke Maternal Uncle         In his 80s    Thyroid Disease Maternal Uncle     Diabetes Paternal Grandfather     Birth Defect Nephew         craniosynostosis    Thyroid Disease Maternal Uncle     Cancer-Breast Maternal Great-Aunt 16     Social History     Socioeconomic History    Marital status: Married    Number of children: 8   Occupational History    Occupation: Retired Engineer, civil (consulting)     Comment: Worked in Multimedia programmer; also worked in the ambulatory setting   Tobacco Use    Smoking status: Never     Passive exposure: Past    Smokeless tobacco: Never    Tobacco comments:     Significant second hand smoke from parents 68-73 years old   Vaping Use    Vaping  Use: Never used   Substance and Sexual Activity    Alcohol use: Not Currently     Comment: Prior to 2020, socially, one drink once a week at most    Drug use: Never     Comment: in late teens, early twenties, a few times and not since    Sexual activity: Yes     Partners: Male     Birth control/protection: Post-menopausal, None     Vaping/E-liquid Use    Vaping Use Never User                  Objective:          acetaminophen (TYLENOL) 325 mg tablet Take two tablets by mouth every 6 hours. Take scheduled for 3 days after surgery, then as needed. Do not exceed 4,000mg  in a 24 hour period.    biotin 1 mg cap Take  by mouth.    CALCIUM PO Take  by mouth.    cholecalciferol (vitamin D3) (VITAMIN D3 PO) Take  by mouth.    clobetasoL (TEMOVATE) 0.05 % topical ointment Apply  topically to affected area twice daily. Apply to areas of rash on hands twice daily; do NOT use on face/groin/underarms, use up to 2 weeks per month    diclofenac sodium DR (VOLTAREN) 50 mg tablet Take one tablet by mouth as Needed. Take with food.    escitalopram oxalate (LEXAPRO) 10 mg tablet Take 1.5 tablets by mouth daily.    exemestane (AROMASIN) 25 mg tablet Take one tablet by mouth daily. Take after a meal.    Fish,Bora,Flax Oils-OM3,6,9 #1 (TRIPLE OMEGA) 400-400-400 mg capsule Take one capsule by mouth daily.    glucosam/chond-msm1/C/mang/bor (GLUCOSAMINE-CHOND-MSM COMPLEX PO) Take 1 capsule by mouth daily.    lisinopriL (ZESTRIL) 5 mg tablet Take one tablet by mouth daily.    omeprazole DR (PRILOSEC) 40 mg capsule Take one capsule by mouth daily.    SELENIUM PO Take 1 tablet by mouth daily.    SUMAtriptan succinate (IMITREX) 25 mg tablet Take one tablet by mouth at onset of headache. May repeat after 2 hours if needed. Max of 200 mg in 24 hours.    SYNTHROID 112 mcg tablet Take one tablet by mouth daily 30 minutes before breakfast.    traMADoL 25 mg tablet Take one tablet by mouth every 12 hours as needed.    vitamins, B complex tab Take one tablet by mouth three times weekly.    vitamins, multiple cap Take one capsule by mouth daily.    zinc sulfate 220 mg (50 mg elemental zinc) capsule Take one capsule by mouth daily.     Vitals:    06/22/22 1601   BP: 110/76   BP Source: Arm, Right Upper   Pulse: 77   Temp: 36.8 ?C (98.2 ?F)   Resp: 16   SpO2: 97%   TempSrc: Oral   PainSc: Four   Weight: 72.7 kg (160 lb 3.2 oz)   Height: 157.5 cm (5' 2)     Body mass index is 29.3 kg/m?Marland Kitchen     Physical Exam  Vitals reviewed.   HENT:      Head: Normocephalic and atraumatic.   Pulmonary:      Effort: Pulmonary effort is normal.   Musculoskeletal:      Cervical back: Normal range of motion.   Skin:     General: Skin is warm and dry.   Neurological:      Mental Status: She is alert and  oriented to person, place, and time.   Psychiatric:         Mood and Affect: Mood normal.         Behavior: Behavior normal.         BP Readings from Last 5 Encounters:   06/22/22 110/76   06/17/22 137/86   05/31/22 127/77   05/03/22 (!) 127/93   04/30/22 (!) 142/84        Wt Readings from Last 5 Encounters:   06/22/22 72.7 kg (160 lb 3.2 oz)   06/17/22 72.6 kg (160 lb)   05/31/22 73.4 kg (161 lb 13.1 oz)   05/03/22 73.4 kg (161 lb 13.1 oz)   04/30/22 74 kg (163 lb 3.2 oz)        Health Maintenance   Topic Date Due    MEDICARE ANNUAL WELLNESS VISIT  Never done    HIV SCREENING  Never done    PHYSICAL (COMPREHENSIVE) EXAM  Never done    HEPATITIS C SCREENING  Never done    CERVICAL CANCER SCREENING  Never done    SHINGLES RECOMBINANT VACCINE (1 of 2) Never done    RSV VACCINE (60 YEARS AND OLDER) (1 - 1-dose 60+ series) Never done    COVID-19 VACCINE (3 - 2023-24 season) 06/23/2023 (Originally 01/22/2022)    DTAP/TDAP VACCINES (3 - Td or Tdap) 11/21/2025    COLORECTAL CANCER SCREENING  10/15/2031    INFLUENZA VACCINE  Completed    PNEUMOCOCCAL VACCINE 0-64 YRS  Aged Out               Assessment and Plan:    Problem   Chronic Low Back Pain With Bilateral Sciatica   Post-Surgical Hypothyroidism   Cervical Radiculopathy   Ascending Aorta Dilatation (Hcc)   Radiation-Induced Pulmonary Fibrosis (Hcc)   Recurrent Major Depression in Full Remission (Hcc)   Migraine With Aura and Without Status Migrainosus, Not Intractable   Chronic Pain of Multiple Joints   Menopausal Osteoporosis   Malignant Neoplasm of Left Breast in Female, Estrogen Receptor Positive     DIAGNOSIS:  Left grade 1 ILC (ER  96%, PR 10%, HER2 0, Ki-67 3%) at 12:00 dx 11/2020     HISTORY:  Ms. Working is a female who presented to the [facility] on 12/24/2020 at age 5 for left breast cancer.  She reports noticing Left breast intermittent discomfort over the last 2 months.  She had Screening mammogram at [facility] on 11/25/2020 which identified a focal asymmetry in the left breast.  She returned for left diagnostic mammgoram and ultrasound on 12/04/2020 which showed an ill-defined hypoechoic area at 12:00 7 cm FTN measuring 1.7 cm.   She underwent Left ultrasound guided biopsy on 12/10/2020 which revealed grade 1 hormone positive, HER2 negative ILC with associated LCIS. She has no breast complaints.  She proceeded with Bilateral total mastectomy/Left SLNB with oncoplastic flat closure on 01/22/2021.  Final surgical pathology revealed multifocal grade 1 ILC that merge to measuring 7.4 cm with associated LCIS, clear margins and 5/5 lymph nodes. The right breast is benign.   Adjuvant chemotherapy and cALND were recommended.  She completed adjuvant chemotherapy of dd AC from 10/3 to 04/06/2021, taxol was recommended but she declined.  She returned for Left completion ALND on 05/14/2021.  Final surgical pathology revealed 6/14 lymph nodes positive. She finished radiation with Dr. Haskel Khan on 07/01/21.    BREAST IMAGING:  Mammogram:    - Screening mammogram 11/25/2020 ([facility]) revealed scattered fibroglandular tissue  density.  Benign appearing microcalcification. Unchanged right upper central anterior breast low density circumscribed masses dating back to 2013.  Focal asymmetry located within the left posterior central breast, 12:00, 7.3 cm FTN most conspicuous on MLO.  Recommend spot compression.    - Left diagnostic mammogram 12/04/2020 ([facility]) revealed irregular density at 12:00 left breast persists.  Ultrasound recommended.    Ultrasound:    Left targeted ultrasound 12/04/2020 ([facility]) revealed an ill-defined area which was slightly hypoechoic at 12:00, 7 cm FTN.  This was indeterminate and biopsy was recommended.   MRI:    - Breast MRI 12/26/2020 ([facility]) The breast tissue is scattered areas of fibroglandular tissue.   There is mild background parenchymal enhancement. Left breast: There are multiple faint discontiguous irregular enhancing masses and discontiguous intervening nonmass enhancement involving predominantly the upper and inner left breast from anterior to posterior depth measuring in aggregate 6.3 cm AP by 3.6 cm transverse by 6.4 cm craniocaudal. Several areas demonstrate persistent enhancement kinetics. The artifact from tissue marker clips (ribbon and heart) at the site of biopsy demonstrating malignancy are seen within the upper left breast at the 12:00 position, middle depth (image 80). A representative discontiguous irregular enhancing mass is seen within the inner left breast at the 9:00 position, middle depth as on image 111, which demonstrates a mammographic correlate on outside CC tomosynthesis slice 18. Anterior extent of abnormal enhancement extends to the base of the left nipple. Posterior extent of abnormal enhancement is approximately 2.1 cm anterior to the underlying pectoralis muscle. There are greater than 5 small round morphologically abnormal and asymmetric level 1 left axillary lymph nodes. No suspicious level 2 or level 3 lymph nodes are seen. No suspicious internal mammary lymph nodes are seen. Right breast: No suspicious mass or nonmass enhancement is seen within the right breast. No suspicious right axillary or internal mammary lymph nodes are identified. Incidental Findings: None     REPRODUCTIVE HEALTH:  Age at first Menarche:  51  Age at First Live Birth:  2  Age at Menopause:  84  Gravida:  8  Para: 8  Breastfeeding:  yes    PROCEDURE:  Bilateral Total mastectomy/Left SLNB oncoplastic flat closure 01/22/2021 ([facility])  2. Left ALND 05/14/2021 [facility])  PATHOLOGY: multifocal grade 1 ILC that merge to measuring 7.4 cm with associated LCIS, clear margins and 5/5 lymph nodes. The right breast is benign.   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  No family history of ovarian or prostate cancer.   MEDICAL ONCOLOGY:    Dr. Neil Crouch Adjuvant chemotherapy:  ddACx 4 completed 04/06/2021, Taxol recommended but declined; Letrozole/Verzenio  REFERRED BY:  Dr. Erskine Emery  Formatting of this note might be different from the original. Formatting of this note might be different from the original. DIAGNOSIS:  Left grade 1 ILC (ER 91-100, PR 11-20, HER 1+, Ki 67 2-5%) at 12:00 dx 11/2020 HISTORY:  Ms. Grief is a female who presented to the [facility] on 12/24/2020 at age 72 for left breast cancer.  She reports noticing Left breast intermittent discomfort over the last 2 months.  She had Screening mammogram at [facility] on 11/25/2020 which identified a focal asymmetry in the left breast.  She returned for left diagnostic mammgoram and ultrasound on 12/04/2020 which showed an ill-defined hypoechoic area at 12:00 7 cm FTN measuring 1.7 cm.   She underwent Left ultrasound guided biopsy on 12/10/2020 which revealed grade  1 hormone positive, HER2 negative ILC with associated LCIS. She has no breast complaints. BREAST IMAGING: Mammogram:   - Screening mammogram 11/25/2020 ([facility]) revealed scattered fibroglandular tissue density.  Benign appearing microcalcification. Unchanged right upper central anterior breast low density circumscribed masses dating back to 2013.  Focal asymmetry located within the left posterior central breast, 12:00, 7.3 cm FTN most conspicuous on MLO.  Recommend spot compression.   - Left diagnostic mammogram 12/04/2020 ([facility]) revealed irregular density at 12:00 left breast persists.  Ultrasound recommended.  Ultrasound:  Left targeted ultrasound 12/04/2020 ([facility]) revealed an ill-defined area which was slightly hypoechoic at 12:00, 7 cm FTN.  This was indeterminate and biopsy was recommended. MRI:   - Breast MRI 12/26/2020 ([facility]) .... REPRODUCTIVE HEALTH: Age at first Menarche:  66 Age at First Live Birth:  59 Age at Menopause:  52 Gravida:  8 Para: 8 Breastfeeding:  yes PROCEDURE:  pending PERTINENT PMH:  Thyroid Cancer (1980- thyroidectomy), HTN FAMILY HISTORY:  Mother- Breast cancer (65).  No family history of ovarian or prostate cancer. PHYSICAL EXAM on PRESENTATION:   MEDICAL ONCOLOGY:    Dr. Neil Crouch REFERRED BY:  Dr. Erskine Emery     Hypermobility Syndrome   Degenerative Disc Disease, Lumbar   Primary Hypertension   Osteoporosis, Unspecified (Resolved)   History of Bilateral Mastectomy (Resolved)   History of Thyroid Cancer (Resolved)    TSH 0.39 and pt not taking med regularly until about 2 weeks ago.  Will have her repeat TSH in 3 weeks and f/u after.  Needs to be between 0.1 and 0.3.  Will refer her back to Endo for f/u.     Family History of Coronary Artery Disease in Brother (Resolved)    04/01/2022- Per OV Dr. Maisie Fus      History of External Beam Radiation Therapy (Resolved)   H/O Total Thyroidectomy (Resolved)       Problem List Items Addressed This Visit          CARDIAC AND VASCULATURE    Primary hypertension     Well controlled  Currently on lisinopril 2.5 mg BID, switched to 5 mg daily  Advised to check BP regularly and maintain BP log to provide during next visit           Relevant Medications    lisinopriL (ZESTRIL) 5 mg tablet    Ascending aorta dilatation (HCC)     Noted on CT Chest from 03/06/2022  Will continue to monitor            ENDOCRINE AND METABOLIC    Menopausal osteoporosis     Currently on IV zoledronic acid therapy managed by oncology  Has repeat BMP due in 03/2023         Post-surgical hypothyroidism     Chronic stable condition  Follows with endocrinology  Currently on levothyroxine            HEMATOLOGY AND NEOPLASIA    Malignant neoplasm of left breast in female, estrogen receptor positive      Completed 4 cycles of adjuvant AC, declined Taxol. She completed radiation 07/01/21.   Follows with oncology  Currently on aromasin         Relevant Medications    exemestane (AROMASIN) 25 mg tablet       MENTAL HEALTH    Recurrent major depression in full remission (HCC)     In remission  Currently on lexapro 15 mg daily         Relevant Medications  escitalopram oxalate (LEXAPRO) 10 mg tablet       MUSCULOSKELETAL AND INJURIES    Chronic pain of multiple joints    Relevant Medications    traMADoL 25 mg tablet       NEURO    Cervical radiculopathy     Follows with spine center  Currently receives cervical epidural steroid injections         Migraine with aura and without status migrainosus, not intractable     Has maybe 2 episodes a year; sometimes has aphasia with episodes that lasts 2 hours and resolves  Takes benadryl and motrin  Prescribed sumatriptan as needed         Relevant Medications    SUMAtriptan succinate (IMITREX) 25 mg tablet       PULMONARY AND PNEUMONIAS    Radiation-induced pulmonary fibrosis (HCC)     Chronic stable condition  Will continue to monitor          Other Visit Diagnoses       Encounter to establish care    -  Primary    Screening for HIV (human immunodeficiency virus)        Relevant Orders    HIV 1 & 2 AG-AB SCRN W REFLEX TO HIV CONFIRMATION    Need for hepatitis C screening test        Relevant Orders    HEPATITIS C ANTIBODY W REFLEX HCV PCR QUANT    Preventative health care        Need for shingles vaccine        Relevant Orders    ZOSTER VACCINE RECOM, ADJUVANT SUSPENSION COMPONENT (VIAL 1 OF 2) (Completed)    ZOSTER VACCINE RECOMB, ADJ LYOPHILIZED COMPONENT (VIAL 2 OF 2)             Patient Instructions   Dr. Velta Addison nurse is Raquel, RN.  Their phone number is [phone]. Please leave your name, the spelling of your last name, date of birth, phone number they can call you back and a description of why you are calling.  They will return your call as soon as possible.  If you need to send them a fax, Their fax number is [phone].      Return in about 3 months (around 09/21/2022) for HTN f/u.    Total duration of visit 45 minutes on the day of the visit, including chart review, review of vitals and nursing note, obtaining history from the patient, performing a medically appropriate examination and evaluation, counseling with patient, placing orders, coordination of care, and documentation.  Counseled patient regarding evaluation and management of diagnosis, risks and benefits of therapy.

## 2022-06-22 NOTE — Assessment & Plan Note
Chronic stable condition  Will continue to monitor

## 2022-06-23 ENCOUNTER — Encounter: Admit: 2022-06-23 | Discharge: 2022-06-23 | Payer: MEDICARE

## 2022-06-23 DIAGNOSIS — Z1159 Encounter for screening for other viral diseases: Secondary | ICD-10-CM

## 2022-06-23 DIAGNOSIS — M2550 Pain in unspecified joint: Secondary | ICD-10-CM

## 2022-06-23 DIAGNOSIS — Z23 Encounter for immunization: Secondary | ICD-10-CM

## 2022-06-23 DIAGNOSIS — G8929 Other chronic pain: Secondary | ICD-10-CM

## 2022-06-23 DIAGNOSIS — Z170 Estrogen receptor positive status [ER+]: Secondary | ICD-10-CM

## 2022-06-23 DIAGNOSIS — Z114 Encounter for screening for human immunodeficiency virus [HIV]: Secondary | ICD-10-CM

## 2022-06-23 DIAGNOSIS — G43109 Migraine with aura, not intractable, without status migrainosus: Secondary | ICD-10-CM

## 2022-06-28 ENCOUNTER — Encounter: Admit: 2022-06-28 | Discharge: 2022-06-28 | Payer: MEDICARE

## 2022-06-28 ENCOUNTER — Ambulatory Visit: Admit: 2022-06-28 | Discharge: 2022-06-28 | Payer: MEDICARE

## 2022-06-28 DIAGNOSIS — M5136 Other intervertebral disc degeneration, lumbar region: Secondary | ICD-10-CM

## 2022-06-28 DIAGNOSIS — R002 Palpitations: Secondary | ICD-10-CM

## 2022-06-28 DIAGNOSIS — W19XXXA Unspecified fall, initial encounter: Secondary | ICD-10-CM

## 2022-06-28 DIAGNOSIS — R519 Generalized headaches: Secondary | ICD-10-CM

## 2022-06-28 DIAGNOSIS — R079 Chest pain, unspecified: Secondary | ICD-10-CM

## 2022-06-28 DIAGNOSIS — Z85850 Personal history of malignant neoplasm of thyroid: Secondary | ICD-10-CM

## 2022-06-28 DIAGNOSIS — C73 Malignant neoplasm of thyroid gland: Secondary | ICD-10-CM

## 2022-06-28 DIAGNOSIS — Z789 Other specified health status: Secondary | ICD-10-CM

## 2022-06-28 DIAGNOSIS — C801 Malignant (primary) neoplasm, unspecified: Secondary | ICD-10-CM

## 2022-06-28 DIAGNOSIS — C50919 Malignant neoplasm of unspecified site of unspecified female breast: Secondary | ICD-10-CM

## 2022-06-28 DIAGNOSIS — E079 Disorder of thyroid, unspecified: Secondary | ICD-10-CM

## 2022-06-28 DIAGNOSIS — I10 Essential (primary) hypertension: Secondary | ICD-10-CM

## 2022-06-28 DIAGNOSIS — E039 Hypothyroidism, unspecified: Secondary | ICD-10-CM

## 2022-06-28 DIAGNOSIS — F419 Anxiety disorder, unspecified: Secondary | ICD-10-CM

## 2022-06-28 DIAGNOSIS — F32A Depression: Secondary | ICD-10-CM

## 2022-06-28 DIAGNOSIS — R931 Abnormal findings on diagnostic imaging of heart and coronary circulation: Secondary | ICD-10-CM

## 2022-06-28 DIAGNOSIS — B999 Unspecified infectious disease: Secondary | ICD-10-CM

## 2022-06-28 DIAGNOSIS — R011 Cardiac murmur, unspecified: Secondary | ICD-10-CM

## 2022-06-28 DIAGNOSIS — R32 Unspecified urinary incontinence: Secondary | ICD-10-CM

## 2022-06-28 DIAGNOSIS — Z136 Encounter for screening for cardiovascular disorders: Secondary | ICD-10-CM

## 2022-06-28 DIAGNOSIS — Z9013 Acquired absence of bilateral breasts and nipples: Secondary | ICD-10-CM

## 2022-06-28 DIAGNOSIS — M5134 Other intervertebral disc degeneration, thoracic region: Secondary | ICD-10-CM

## 2022-06-28 DIAGNOSIS — M810 Age-related osteoporosis without current pathological fracture: Secondary | ICD-10-CM

## 2022-06-28 DIAGNOSIS — M549 Dorsalgia, unspecified: Secondary | ICD-10-CM

## 2022-06-28 DIAGNOSIS — M4800 Spinal stenosis, site unspecified: Secondary | ICD-10-CM

## 2022-06-28 DIAGNOSIS — E7800 Pure hypercholesterolemia, unspecified: Secondary | ICD-10-CM

## 2022-06-28 DIAGNOSIS — K219 Gastro-esophageal reflux disease without esophagitis: Secondary | ICD-10-CM

## 2022-06-28 DIAGNOSIS — Z8249 Family history of ischemic heart disease and other diseases of the circulatory system: Secondary | ICD-10-CM

## 2022-06-28 DIAGNOSIS — Z923 Personal history of irradiation: Secondary | ICD-10-CM

## 2022-06-28 DIAGNOSIS — M5030 Other cervical disc degeneration, unspecified cervical region: Secondary | ICD-10-CM

## 2022-06-28 DIAGNOSIS — D539 Nutritional anemia, unspecified: Secondary | ICD-10-CM

## 2022-06-28 DIAGNOSIS — S83209A Unspecified tear of unspecified meniscus, current injury, unspecified knee, initial encounter: Secondary | ICD-10-CM

## 2022-06-28 DIAGNOSIS — E890 Postprocedural hypothyroidism: Secondary | ICD-10-CM

## 2022-06-28 DIAGNOSIS — IMO0002 Ulcer: Secondary | ICD-10-CM

## 2022-06-28 DIAGNOSIS — M2550 Pain in unspecified joint: Secondary | ICD-10-CM

## 2022-06-28 DIAGNOSIS — Z9221 Personal history of antineoplastic chemotherapy: Secondary | ICD-10-CM

## 2022-06-28 MED ORDER — ROSUVASTATIN 10 MG PO TAB
10 mg | ORAL_TABLET | Freq: Every day | ORAL | 3 refills | 90.00000 days | Status: AC
Start: 2022-06-28 — End: ?

## 2022-06-28 MED ORDER — ROSUVASTATIN 10 MG PO TAB
10 mg | ORAL_TABLET | Freq: Every day | ORAL | 3 refills | 90.00000 days | Status: DC
Start: 2022-06-28 — End: 2022-06-28

## 2022-06-28 NOTE — Patient Instructions
Follow-Up:    -Thank you for allowing [place] to participate in your care today. Your After Visit Summary is being completed by Priscila Bean RN.    -We would like you to follow up in  3 months with Orpah Cobb, MD  -The schedule is released approximately 4-5 months in advance. You will be called by our scheduling department to make an appointment and you will also receive a notification via MyChart to self-schedule.  However, if you would like to call to make this appointment, please call [phone].    -Please schedule the following testing at check out, or by calling our scheduling line: Schedule an echocardiogram on a future date @ Checkout Desk or by calling [phone] (This is an ultrasound of your heart. Please allow 1 hour for this test. If needed, an IV is required to obtain more quality images using Definity imaging enhancement). This is performed in one of our Cardiology Echo Departments     [facility]  CT/PET Stress Test Instructions    Schedule ONE part CT/PET Stress Test: CALL [phone], Option 1 to schedule    Your cardiologist has asked that you have a CT/PET stress test.This evaluation of your heart muscle consists of 1 set of images.    You will get an IV placed in your arm for the test.    You will need to be able to raise your arm up by your head for about 20 minutes and lie on your back for about 10 minutes.  Please discuss this with your doctor or talk with the nuclear technologist or nurse if these are a problem for you.    It is recommended not to schedule any other appointment for the same day.      Wear comfortable clothing and walking shoes.  Women should wear shorts or comfortable pants instead of dresses. Sweatshirts or T-shirts work really well for imaging.    PLEASE NO CAFFEINE 24 HOURS PRIOR TO TEST:  Examples include coffee, tea, decaffeinated drinks, colas, [facility], Dr. Reino Kent.  Some orange sodas and root beers have caffeine, please check.  No energy drinks, Excedrin, Midol, or any foods CONTAINING CHOCOLATE.   Consuming Caffeine may postpone your test.    PLEASE DO NOT EAT OR DRINK THE MORNING OF YOUR TEST.  Water is ok to drink with your morning medications.  Please hold these medications the day of test: Vitamins/Supplements,     You will NOT need a driver for this test.  But are welcome to bring a visitor with you.   Visitors will not be able to accompany you back to stress room.   Please do not bring children to the nuclear stress test.      DIABETIC PATIENTS:  if insulin dependent:  please take one third of your insulin with two pieces of dry toast and a small juice.  Bring remaining 2/3   insulin and oral diabetic medications with you to your test.    TEST FINDINGS:   You will receive the results of the test within 7 business days of completion of this test by telephone. If you have any questions concerning your nuclear stress test or if you do not hear from your cardiologist/or nurse with 7 business days, please call our office.   Changes From Today's Office Visit   1. You will wear a monitor for 14 days. Please keep the box the monitor comes in, as you will return in the same box. The box  is pre-labed for return via mail. Results can take at least two weeks to receive from when the monitor is dropped off.  2. Start rosuvastatin 10mg . A new prescription has been sent to your pharmacy.  3. Complete labs in three months. We will send a reminder as it gets closer.        Contacting our office:    -For NON-URGENT questions please contact us via message through your MyChart account.   -For all medication refills please contact your pharmacy or send a request through MyChart.     -For all questions that may need to be addressed urgently please call the [facility] nursing triage line at [phone] Monday - Friday 8am-5pm only. Please leave a detailed message with your name, date of birth, and reason for your call.  If your message is received before 3:30pm, every effort will be made to call you back the same day.  Please allow time for [place] to review your chart prior to call back.     -Should you have an urgent concern over the weekend/nights, the on-call triage line is [phone].    Doylene Canning team fax number: [phone]    -You may receive a survey in the upcoming weeks from [facility] [place] [facility]. Your feedback is important to [place] and helps [place] continue to improve patient care and patient satisfaction.     -Please feel free to call our Financial Department at [phone] with any questions or concerns about estimated cost of testing or imaging ordered today. We are happy to provide CPT codes upon request.    Results & Testing Follow Up:    -Please allow 5-7 business days for the results of any testing to be reviewed. Please call our office if you have not heard from a nurse within this time frame.    -Should you choose to complete testing at an outside facility, please contact our office after completion of testing so that we can ensure that we have received results for your provider to review.    Lab and test results:  As a part of the CARES act, starting 08/23/2019, some results will be released to you via MyChart immediately and automatically.  You may see results before your provider sees them; however, your provider will review all these results and then they, or one of their team, will notify you of result information and recommendations.   Critical results will be addressed immediately, but otherwise, please allow [place] time to get back with you prior to you reaching out to [place] for questions.  This will usually take about 72 hours for labs and 5-7 days for procedure test results.        Rosuvastatin Oral Tablet  Brands: Crestor  Uses  To lower high fat levels in blood.  Instructions  Swallow the medicine without crushing or chewing it.  This medicine may be taken with or without food.  Keep the medicine at room temperature. Avoid heat and direct light.  Do not take this medicine with antacids.  It is important that you keep taking each dose of this medicine on time even if you are feeling well.  If you forget to take a dose on time, take it as soon as you remember. If it is almost time for the next dose, do not take the missed dose. Return to your normal schedule. Do not take 2 doses at one time.  Tell your doctor and pharmacist about all your medicines. Include prescription and over-the-counter  medicines, vitamins, and herbal medicines.  It is very important that you follow your doctor's instructions for all blood tests.  Cautions  Tell your doctor and pharmacist if you ever had an allergic reaction to a medicine.  Do not use the medication any more than instructed.  Please check with your doctor before drinking alcohol while on this medicine.  Contact your doctor if you notice a change in the amount or darkening of your urine.  Do not breastfeed while on this medicine. This medicine can pass through breast milk to the baby.  During pregnancy, this medicine should be used only when clearly needed. Talk to your doctor about the risks and benefits.  Do not start or stop any other medicines without first speaking to your doctor or pharmacist.  Do not share this medicine with anyone who has not been prescribed this medicine.  Side Effects  Call your doctor or get medical help right away if you notice any of these more serious side effects:  lack of energy and tiredness  signs of liver damage (such as yellowing of eye or skin, dark urine, or unusual tiredness)  muscle pain  nausea  stomach upset or abdominal pain  A few people may have an allergic reaction to this medicine. Symptoms can include difficulty breathing, skin rash, itching, swelling, or severe dizziness. If you notice any of these symptoms, seek medical help quickly.  Extra  Please speak with your doctor, nurse, or pharmacist if you have any questions about this medicine.  [email or web address].[email or web address]  IMPORTANT NOTE: This document tells you briefly how to take your medicine, but it does not tell you all there is to know about it. Your doctor or pharmacist may give you other documents about your medicine. Please talk to them if you have any questions. Always follow their advice. There is a more complete description of this medicine available in [place]. Scan this code on your smartphone or tablet or use the web address below. You can also ask your pharmacist for a printout. If you have any questions, please ask your pharmacist. The display and use of this drug information is subject to Terms of Use. Copyright(c) 2023 [employer]   ? 2000-2023 [employer] [place] [place]. All rights reserved. This information is not intended as a substitute for professional medical care. Always follow your healthcare professional's instructions.

## 2022-06-28 NOTE — Progress Notes
I have served as the witness to ambulatory cardiac monitor placement. I have reviewed the vendor and serial number of the monitor provided to Cindy Randolph and verified that the information documented in the ambulatory monitor flowsheet is accurate and complete.     Horton Finer, RN

## 2022-06-28 NOTE — Progress Notes
Cardiovascular Medicine       Date of Service: 06/28/2022      HPI     Cindy Randolph is a 62 y.o. female who was seen today in the Cardiovascular Medicine Clinic at [facility] at our [facility] office.   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  She also has a past medical history of hypertension, arthralgias, hyperlipidemia and coronary artery calcification.  She has a past medical history of premature coronary artery disease.  She presents today to establish care.    She reports that since the past summer she has noticed exertional chest pain.  She mostly notices this when she is walking her dog and if she climbs a steep hill etc., she will develop chest pressure that resolves with rest.  She also has hiatal hernia and previously attributed her symptoms to this.  She underwent an EGD and has started omeprazole but this has not significantly helped her symptoms.  She does not have any shortness of breath, lightheadedness, orthopnea, paroxysmal nocturnal dyspnea or pedal edema.  She also reports palpitations that occur randomly and have no association with exertion, position or diurnal variation.  They do not cause any loss of consciousness but she does attribute them with tunnel vision.  She does not have any shortness of breath during these palpitations.    Given her symptoms and concern for premature coronary artery disease in the family, she underwent a coronary artery calcium score that was elevated as below.  She was prescribed rosuvastatin by her primary care physician but wanted to seek our opinion before starting it.  She has myalgias with the aromatase inhibitors that she is currently taking.      Father had MI and CABG in his 79s. He was diabetic and a smoker.   Brother had LAD PCI in his 49s.              Transthoracic echocardiogram 01/2021:  Left Ventricle: The left ventricular size is normal. Concentric remodeling. The left ventricular systolic function is normal. The ejection fraction by Simpson's biplane method is 55%. The global longitudinal strain is -17%. There are no segmental wall motion abnormalities. Normal left ventricular diastolic function. Normal left atrial pressure.  Right Ventricle: The right ventricular size is normal. The right ventricular systolic function is normal.  Left Atrium: Normal size. Right Atrium: Normal size.  No hemodynamically significant valvular abnormalities.  The aortic root is normal in size. Mildly dilated ascending aorta.  Estimated Peak Systolic PA Pressure 22 mmHg  Normal central venous pressure    Coronary artery calcium score 02/2022:  Total calcium score is 136 which represents the 80th percentile rank for   females age 67-60.   1.  A calcium score of 136 correlates with moderate calcification.   2.  There is minimal calcific atheromatous plaque in the visualized   portions of the ascending and descending aorta.   3.  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     ECG in clinic today shows normal sinus rhythm.        Assessment & Plan   62 y.o. female patient with the following medical problems:    Family history of premature coronary artery disease.  Exertional chest pain.  Pretension.  History of ER positive breast cancer.    Given symptoms suggestive of exertional angina and family history  of premature coronary artery disease and the presence of coronary artery calcification, will do a PET MPI to rule out any significant coronary artery disease.  Aggressive risk factor modification:  Blood pressure is well-controlled, she is not diabetic and does not smoke.  Will start rosuvastatin 10 mg and assess lipid panel in 3 months.  If she is unable to tolerate statins, will consider injectable PCSK9 inhibitors.  Will get a echocardiogram given she has decreased exercise tolerance in the setting of known chemotherapy for her breast cancer.  2-week event monitor to assess for any significant arrhythmias or conduction deficits causing her palpitations.      Return to clinic in 3 months.         Past Medical History  Patient Active Problem List    Diagnosis Date Noted    Chronic low back pain with bilateral sciatica 06/22/2022    Post-surgical hypothyroidism 06/22/2022    Cervical radiculopathy 06/22/2022    Ascending aorta dilatation (HCC) 06/22/2022    Radiation-induced pulmonary fibrosis (HCC) 06/22/2022    Recurrent major depression in full remission (HCC) 06/22/2022    Migraine with aura and without status migrainosus, not intractable 06/22/2022    Chronic pain of multiple joints 06/22/2022    Elevated coronary artery calcium score 06/17/2022     04/01/2022- Per OV Dr. Maisie Fus   03/06/2022- CT Cardiac Calcium Score: Total calcium score is 136 which represents the 80th percentile rank for females age 81-60. A calcium score of 136 correlates with moderate calcification. There is minimal calcific atheromatous plaque in the visualized portions of the ascending and descending aorta. 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 Stable dilated ascending aorta measuring 4.1 cm, previously 4.1 cm.       Hiatal hernia with gastroesophageal reflux 06/17/2022     04/01/2022- Per OV Dr. Maisie Fus       Menopausal osteoporosis 07/13/2021    Excess skin of breast 01/15/2021    Malignant neoplasm of left breast in female, estrogen receptor positive  12/22/2020     DIAGNOSIS:  Left grade 1 ILC (ER  96%, PR 10%, HER2 0, Ki-67 3%) at 12:00 dx 11/2020     HISTORY:  Ms. Cindy Randolph is a female who presented to the [facility] on 12/24/2020 at age 32 for left breast cancer.  She reports noticing Left breast intermittent discomfort over the last 2 months.  She had Screening mammogram at [facility] on 11/25/2020 which identified a focal asymmetry in the left breast.  She returned for left diagnostic mammgoram and ultrasound on 12/04/2020 which showed an ill-defined hypoechoic area at 12:00 7 cm FTN measuring 1.7 cm.   She underwent Left ultrasound guided biopsy on 12/10/2020 which revealed grade 1 hormone positive, HER2 negative ILC with associated LCIS. She has no breast complaints.  She proceeded with Bilateral total mastectomy/Left SLNB with oncoplastic flat closure on 01/22/2021.  Final surgical pathology revealed multifocal grade 1 ILC that merge to measuring 7.4 cm with associated LCIS, clear margins and 5/5 lymph nodes. The right breast is benign.   Adjuvant chemotherapy and cALND were recommended.  She completed adjuvant chemotherapy of dd AC from 10/3 to 04/06/2021, taxol was recommended but she declined.  She returned for Left completion ALND on 05/14/2021.  Final surgical pathology revealed 6/14 lymph nodes positive. She finished radiation with Dr. Haskel Khan on 07/01/21.    BREAST IMAGING:  Mammogram:    - Screening mammogram 11/25/2020 ([facility]) revealed scattered fibroglandular tissue density.  Benign appearing microcalcification. Unchanged right  upper central anterior breast low density circumscribed masses dating back to 2013.  Focal asymmetry located within the left posterior central breast, 12:00, 7.3 cm FTN most conspicuous on MLO.  Recommend spot compression.    - Left diagnostic mammogram 12/04/2020 ([facility]) revealed irregular density at 12:00 left breast persists.  Ultrasound recommended.    Ultrasound:    Left targeted ultrasound 12/04/2020 ([facility]) revealed an ill-defined area which was slightly hypoechoic at 12:00, 7 cm FTN.  This was indeterminate and biopsy was recommended.   MRI:    - Breast MRI 12/26/2020 ([facility]) The breast tissue is scattered areas of fibroglandular tissue.   There is mild background parenchymal enhancement. Left breast: There are multiple faint discontiguous irregular enhancing masses and discontiguous intervening nonmass enhancement involving predominantly the upper and inner left breast from anterior to posterior depth measuring in aggregate 6.3 cm AP by 3.6 cm transverse by 6.4 cm craniocaudal. Several areas demonstrate persistent enhancement kinetics. The artifact from tissue marker clips (ribbon and heart) at the site of biopsy demonstrating malignancy are seen within the upper left breast at the 12:00 position, middle depth (image 80). A representative discontiguous irregular enhancing mass is seen within the inner left breast at the 9:00 position, middle depth as on image 111, which demonstrates a mammographic correlate on outside CC tomosynthesis slice 18. Anterior extent of abnormal enhancement extends to the base of the left nipple. Posterior extent of abnormal enhancement is approximately 2.1 cm anterior to the underlying pectoralis muscle. There are greater than 5 small round morphologically abnormal and asymmetric level 1 left axillary lymph nodes. No suspicious level 2 or level 3 lymph nodes are seen. No suspicious internal mammary lymph nodes are seen. Right breast: No suspicious mass or nonmass enhancement is seen within the right breast. No suspicious right axillary or internal mammary lymph nodes are identified. Incidental Findings: None     REPRODUCTIVE HEALTH:  Age at first Menarche:  42  Age at First Live Birth:  45  Age at Menopause:  27  Gravida:  8  Para: 8  Breastfeeding:  yes    PROCEDURE:  Bilateral Total mastectomy/Left SLNB oncoplastic flat closure 01/22/2021 ([facility])  2. Left ALND 05/14/2021 [facility])  PATHOLOGY: multifocal grade 1 ILC that merge to measuring 7.4 cm with associated LCIS, clear margins and 5/5 lymph nodes. The right breast is benign.   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  No family history of ovarian or prostate cancer.   MEDICAL ONCOLOGY:    Dr. Neil Crouch Adjuvant chemotherapy:  ddACx 4 completed 04/06/2021, Taxol recommended but declined; Letrozole/Verzenio  REFERRED BY:  Dr. Erskine Emery  Formatting of this note might be different from the original. Formatting of this note might be different from the original. DIAGNOSIS:  Left grade 1 ILC (ER 91-100, PR 11-20, HER 1+, Ki 67 2-5%) at 12:00 dx 11/2020 HISTORY:  Ms. Schaff is a female who presented to the [facility] on 12/24/2020 at age 76 for left breast cancer.  She reports noticing Left breast intermittent discomfort over the last 2 months.  She had Screening mammogram at [facility] on 11/25/2020 which identified a focal asymmetry in the left breast.  She returned for left diagnostic mammgoram and ultrasound on 12/04/2020 which showed an ill-defined hypoechoic area at 12:00 7 cm FTN measuring 1.7 cm.   She underwent Left ultrasound guided biopsy on 12/10/2020 which revealed grade 1 hormone positive, HER2 negative ILC with  associated LCIS. She has no breast complaints. BREAST IMAGING: Mammogram:   - Screening mammogram 11/25/2020 ([facility]) revealed scattered fibroglandular tissue density.  Benign appearing microcalcification. Unchanged right upper central anterior breast low density circumscribed masses dating back to 2013.  Focal asymmetry located within the left posterior central breast, 12:00, 7.3 cm FTN most conspicuous on MLO.  Recommend spot compression.   - Left diagnostic mammogram 12/04/2020 ([facility]) revealed irregular density at 12:00 left breast persists.  Ultrasound recommended.  Ultrasound:  Left targeted ultrasound 12/04/2020 ([facility]) revealed an ill-defined area which was slightly hypoechoic at 12:00, 7 cm FTN.  This was indeterminate and biopsy was recommended. MRI:   - Breast MRI 12/26/2020 ([facility]) .... REPRODUCTIVE HEALTH: Age at first Menarche:  75 Age at First Live Birth:  35 Age at Menopause:  35 Gravida:  8 Para: 8 Breastfeeding:  yes PROCEDURE:  pending PERTINENT PMH:  Thyroid Cancer (1980- thyroidectomy), HTN FAMILY HISTORY:  Mother- Breast cancer (65).  No family history of ovarian or prostate cancer. PHYSICAL EXAM on PRESENTATION:   MEDICAL ONCOLOGY:    Dr. Neil Crouch REFERRED BY:  Dr. Erskine Emery      Hypermobility syndrome 05/24/2014    Degenerative disc disease, lumbar 02/21/2014    Primary hypertension 11/13/2007       I reviewed and confirmed this patient's problem list, active medications, allergies, and past medical, social, family & tobacco histories.     Review of Systems  14 point review of systems negative except as above.    Vitals:    06/28/22 0921   BP: 138/64   BP Source: Arm, Right Upper   Pulse: 71   SpO2: 96%   Weight: 73.5 kg (162 lb)   Height: 157.5 cm (5' 2)     Body mass index is 29.63 kg/m?Marland Kitchen     Physical Exam  General Appearance: no acute distress  HEENT: EOMI, mucous membranes moist, oropharynx is clear  Neck Veins: neck veins are flat & not distended  Carotid Arteries: no bruits  Chest Inspection: S/p bilateral mastectomy.  Auscultation/Percussion: lungs clear to auscultation, no rales, rhonchi, or wheezing  Cardiac Rhythm: regular rhythm & normal rate  Cardiac Auscultation: Normal S1 & S2, no S3 or S4, no rub  Murmurs: no cardiac murmurs  Abdominal Exam: soft, non-tender, normal bowel sounds, no masses or bruits  Abdominal aorta: nonpalpable   Liver & Spleen: no organomegaly  Extremities: no lower extremity edema; palpable distal pulses  Skin: warm & intact  Neurologic Exam: oriented to time, place and person; no focal neurologic deficits       Cardiovascular Studies  02/13/21   2D + DOPPLER ECHO   Result Value Ref Range    Left Ventricle Diastolic Volume 66.00 46 - 106 mL    Left Ventricle Systolic Volume 30.00 14 - 42 mL    IVS 1.12 0.6 - 0.9 cm    LVIDD 3.15 3.8 - 5.2 cm    LVIDS 2.63 2.2 - 3.5 cm    PW 0.93 0.6 - 0.9 cm    TDI lateral e' 0.12 m/s    TDI Medial e' 0.07 m/s    LA volume 24.80 22 - 52 mL    LA size 3.46 2.7 - 3.8 cm    AV peak velocity 1.20 m/s    Sinus 3.02 2.4 - 3.6 cm    MV Peak A Vel 0.77 m/s    MV Peak E Vel PW 0.68 m/s    Right Heart Systolic Mmode TAPSE  1.59 >1.7 cm    Right Ventricular Mid Diameter 2.53 1.9 - 3.5 cm    Right Ventricular Basal Diameter 3.20 2.5 - 4.1 cm    Right Atrial Area 14.50 <18 cm2    Right Heart Systolic TDI S' 0.12 m/s    BSA 1.67 m2    Referring Provider Erskine Emery     FS 16.51 28 - 44 %    Teichholtz 28.06 %    Ascending aorta 3.7 cm    LV mass 92 67 - 162 g    RWT 0.59 <=0.42    E/A ratio 0.88     TV rest pulmonary artery pressure 22 mmHg    Lateral E/E' ratio 5.67     Left Atrium Index 14.85 16 - 34 mL/m2    Cardiology Ultrasound Machine [employer] Epiq     Left Ventricle Mass Index 55 43 - 95 g/m2    Left Ventricle Diastolic Volume Index 40 29 - 61 mL/m2    Left Ventricle Systolic Volume Index 18 8 - 24 mL/m2    Medial E/E' ratio 9.71     TR PEAK VELOCITY 2.2 m/s    RV SYSTOLIC PRESSURE 19     RA PRESSURE 3     SIMPSON'S BIPLANE EF 55 %    LEFT VENTRICLE GLOBAL LONGITUDINAL STRAIN -17         Cardiovascular Health Factors  Vitals BP Readings from Last 3 Encounters:   06/28/22 138/64   06/22/22 110/76   06/17/22 137/86     Wt Readings from Last 3 Encounters:   06/28/22 73.5 kg (162 lb)   06/22/22 72.7 kg (160 lb 3.2 oz)   06/17/22 72.6 kg (160 lb)     BMI Readings from Last 3 Encounters:   06/28/22 29.63 kg/m?   06/22/22 29.30 kg/m?   06/17/22 29.26 kg/m?      Smoking Social History     Tobacco Use   Smoking Status Never    Passive exposure: Past   Smokeless Tobacco Never   Tobacco Comments    Significant second hand smoke from parents 52-73 years old      Lipid Profile Cholesterol   Date Value Ref Range Status   02/15/2022 227 (H) <200 MG/DL Final     HDL   Date Value Ref Range Status   02/15/2022 74 >40 MG/DL Final     LDL   Date Value Ref Range Status   02/15/2022 138 (H) <100 mg/dL Final     Triglycerides   Date Value Ref Range Status   02/15/2022 126 <150 MG/DL Final      Blood Sugar No results found for: HGBA1C  Glucose   Date Value Ref Range Status   03/08/2022 73 70 - 100 MG/DL Final 09/81/1914 93 70 - 100 MG/DL Final   78/29/5621 91 70 - 100 MG/DL Final        ASCVD Risk Assessment:     ASCVD 10-year risk calculated: The 10-year ASCVD risk score (Arnett DK, et al., 2019) is: 4.6%    Values used to calculate the score:      Age: 66 years      Sex: Female      Is Non-Hispanic African American: No      Diabetic: No      Tobacco smoker: No      Systolic Blood Pressure: 138 mmHg      Is BP treated: Yes      HDL Cholesterol: 74 MG/DL  Total Cholesterol: 227 MG/DL     LDL 09-811, if ASCVD 10-y risk is >7.5%, high to moderate-intensity statin therapy is recommended  Diabetes with ASCVD 10-y risk >7.5%, high-intensity statin therapy is recommended.  Diabetes with ASCVD 10-y risk <7.5%, moderate-intensity statin therapy is recommended.      Current Medications (including today's revisions)   acetaminophen (TYLENOL) 325 mg tablet Take two tablets by mouth every 6 hours. Take scheduled for 3 days after surgery, then as needed. Do not exceed 4,000mg  in a 24 hour period.    biotin 1 mg cap Take  by mouth.    CALCIUM PO Take  by mouth.    cholecalciferol (vitamin D3) (VITAMIN D3 PO) Take  by mouth.    clobetasoL (TEMOVATE) 0.05 % topical ointment Apply  topically to affected area twice daily. Apply to areas of rash on hands twice daily; do NOT use on face/groin/underarms, use up to 2 weeks per month    diclofenac sodium DR (VOLTAREN) 50 mg tablet Take one tablet by mouth as Needed. Take with food.    escitalopram oxalate (LEXAPRO) 10 mg tablet Take 1.5 tablets by mouth daily.    exemestane (AROMASIN) 25 mg tablet Take one tablet by mouth daily. Take after a meal.    Fish,Bora,Flax Oils-OM3,6,9 #1 (TRIPLE OMEGA) 400-400-400 mg capsule Take one capsule by mouth daily.    glucosam/chond-msm1/C/mang/bor (GLUCOSAMINE-CHOND-MSM COMPLEX PO) Take 1 capsule by mouth daily.    lisinopriL (ZESTRIL) 5 mg tablet Take one tablet by mouth daily.    omeprazole DR (PRILOSEC) 40 mg capsule Take one capsule by mouth daily.    rosuvastatin (CRESTOR) 10 mg tablet Take one tablet by mouth daily.    SELENIUM PO Take 1 tablet by mouth daily.    SUMAtriptan succinate (IMITREX) 25 mg tablet Take one tablet by mouth at onset of headache. May repeat after 2 hours if needed. Max of 200 mg in 24 hours.    SYNTHROID 112 mcg tablet Take one tablet by mouth daily 30 minutes before breakfast.    traMADoL 25 mg tablet Take one tablet by mouth every 12 hours as needed.    vitamins, B complex tab Take one tablet by mouth three times weekly.    vitamins, multiple cap Take one capsule by mouth daily.    zinc sulfate 220 mg (50 mg elemental zinc) capsule Take one capsule by mouth daily.         Orpah Cobb MD  Cardiovascular Medicine.

## 2022-06-28 NOTE — Progress Notes
Ambulatory (External) Cardiac Monitor Enrollment Record     Placement Location: Clinic Placement  Clinic Location: [place]  No data recordedMobile Cardiac Telemetry (MCOT/MCT)?: No  Duration of Monitor (in days): 14  Monitor Diagnosis: Palpitations (R00.2)  No data recordedOrdering Provider: Lurline Del  AMB Monitor Serial Number: [id number]  AMB Monitor Expiration Date: 10/31/22      Start Time and Date: 06/28/22 9:52 AM   Patient Name: Kuuipo Anzaldo  DOB: [birth year]  MRN: [id number]  Sex: female  Mobile Phone Number: [phone] (mobile)  Home Phone Number: [phone]  Patient Address: [place] [address]  Insurance Coverage: MEDICARE PART A AND B  Insurance ID: [id number]  Insurance Group #:   Insurance Subscriber: [place]  Implanted Cardiac Device Information: No results found for: "EPDEVTYP"      Patient instructed to contact company phone number on the monitor box with questions regarding billing, placement, troubleshooting.     Patience Musca    ____________________________________________________________    Clinic Staff:    Complete additional steps for documentation double check/Co-Sign.  In Follow-up, send chart upon closing encounter to [facility]    [facility] Ambulatory Monitoring Team:  Schedule on appropriate template and check-in.   Clinic Placement Schedule on clinic location [facility] schedule   Home Enrollment Schedule on Home Enrollment schedule ([facility])   Given to patient in clinic for self-placement Schedule on Home Enrollment schedule ([facility])   Inpatient Schedule on [facility] AMBULATORY MONITORING template   2. Please enroll with appropriate vendor.

## 2022-07-01 ENCOUNTER — Encounter: Admit: 2022-07-01 | Discharge: 2022-07-01 | Payer: MEDICARE

## 2022-07-06 ENCOUNTER — Encounter: Admit: 2022-07-06 | Discharge: 2022-07-06 | Payer: MEDICARE

## 2022-07-06 ENCOUNTER — Ambulatory Visit: Admit: 2022-07-06 | Discharge: 2022-07-06 | Payer: MEDICARE

## 2022-07-06 DIAGNOSIS — W19XXXA Unspecified fall, initial encounter: Secondary | ICD-10-CM

## 2022-07-06 DIAGNOSIS — Z9013 Acquired absence of bilateral breasts and nipples: Secondary | ICD-10-CM

## 2022-07-06 DIAGNOSIS — Z923 Personal history of irradiation: Secondary | ICD-10-CM

## 2022-07-06 DIAGNOSIS — F32A Depression: Secondary | ICD-10-CM

## 2022-07-06 DIAGNOSIS — K219 Gastro-esophageal reflux disease without esophagitis: Secondary | ICD-10-CM

## 2022-07-06 DIAGNOSIS — M549 Dorsalgia, unspecified: Secondary | ICD-10-CM

## 2022-07-06 DIAGNOSIS — S99921A Unspecified injury of right foot, initial encounter: Secondary | ICD-10-CM

## 2022-07-06 DIAGNOSIS — M2550 Pain in unspecified joint: Secondary | ICD-10-CM

## 2022-07-06 DIAGNOSIS — R32 Unspecified urinary incontinence: Secondary | ICD-10-CM

## 2022-07-06 DIAGNOSIS — Z789 Other specified health status: Secondary | ICD-10-CM

## 2022-07-06 DIAGNOSIS — M5030 Other cervical disc degeneration, unspecified cervical region: Secondary | ICD-10-CM

## 2022-07-06 DIAGNOSIS — M5134 Other intervertebral disc degeneration, thoracic region: Secondary | ICD-10-CM

## 2022-07-06 DIAGNOSIS — C50919 Malignant neoplasm of unspecified site of unspecified female breast: Secondary | ICD-10-CM

## 2022-07-06 DIAGNOSIS — M810 Age-related osteoporosis without current pathological fracture: Secondary | ICD-10-CM

## 2022-07-06 DIAGNOSIS — E039 Hypothyroidism, unspecified: Secondary | ICD-10-CM

## 2022-07-06 DIAGNOSIS — F419 Anxiety disorder, unspecified: Secondary | ICD-10-CM

## 2022-07-06 DIAGNOSIS — R519 Generalized headaches: Secondary | ICD-10-CM

## 2022-07-06 DIAGNOSIS — E890 Postprocedural hypothyroidism: Secondary | ICD-10-CM

## 2022-07-06 DIAGNOSIS — I10 Essential (primary) hypertension: Secondary | ICD-10-CM

## 2022-07-06 DIAGNOSIS — M4800 Spinal stenosis, site unspecified: Secondary | ICD-10-CM

## 2022-07-06 DIAGNOSIS — M5412 Radiculopathy, cervical region: Secondary | ICD-10-CM

## 2022-07-06 DIAGNOSIS — E079 Disorder of thyroid, unspecified: Secondary | ICD-10-CM

## 2022-07-06 DIAGNOSIS — B999 Unspecified infectious disease: Secondary | ICD-10-CM

## 2022-07-06 DIAGNOSIS — Z8249 Family history of ischemic heart disease and other diseases of the circulatory system: Secondary | ICD-10-CM

## 2022-07-06 DIAGNOSIS — M5136 Other intervertebral disc degeneration, lumbar region: Secondary | ICD-10-CM

## 2022-07-06 DIAGNOSIS — R011 Cardiac murmur, unspecified: Secondary | ICD-10-CM

## 2022-07-06 DIAGNOSIS — E7800 Pure hypercholesterolemia, unspecified: Secondary | ICD-10-CM

## 2022-07-06 DIAGNOSIS — S83209A Unspecified tear of unspecified meniscus, current injury, unspecified knee, initial encounter: Secondary | ICD-10-CM

## 2022-07-06 DIAGNOSIS — C73 Malignant neoplasm of thyroid gland: Secondary | ICD-10-CM

## 2022-07-06 DIAGNOSIS — Z85850 Personal history of malignant neoplasm of thyroid: Secondary | ICD-10-CM

## 2022-07-06 DIAGNOSIS — D539 Nutritional anemia, unspecified: Secondary | ICD-10-CM

## 2022-07-06 DIAGNOSIS — C801 Malignant (primary) neoplasm, unspecified: Secondary | ICD-10-CM

## 2022-07-06 DIAGNOSIS — IMO0002 Ulcer: Secondary | ICD-10-CM

## 2022-07-06 MED ORDER — LIDOCAINE (PF) 10 MG/ML (1 %) IJ SOLN
2 mL | Freq: Once | INTRAMUSCULAR | 0 refills | Status: AC
Start: 2022-07-06 — End: ?

## 2022-07-06 MED ORDER — IOHEXOL 240 MG IODINE/ML IV SOLN
1 mL | Freq: Once | EPIDURAL | 0 refills | Status: AC
Start: 2022-07-06 — End: ?

## 2022-07-06 MED ORDER — TRIAMCINOLONE ACETONIDE 40 MG/ML IJ SUSP
80 mg | Freq: Once | EPIDURAL | 0 refills | Status: AC
Start: 2022-07-06 — End: ?

## 2022-07-06 NOTE — Procedures
Attending Surgeon: Lizbeth Bark, MD    Anesthesia: Local    Pre-Procedure Diagnosis:   1. Cervical radiculopathy        Post-Procedure Diagnosis:   1. Cervical radiculopathy             Epidural Steroid Injection Cervical/Thoracic  Procedure: epidural - interlaminar    Laterality: n/a   on 07/06/2022 8:53 AM  Location: cervical ESI with imaging - C7-T1      Consent:   Consent obtained: verbal and written  Consent given by: patient  Risks discussed: bleeding, infection, nerve damage, weakness, reaction to medication and allergic reaction  Alternatives discussed: alternative treatment  Discussed with patient the purpose of the treatment/procedure, other ways of treating my condition, including no treatment/ procedure and the risks and benefits of the alternatives. Patient has decided to proceed with treatment/procedure.        Universal Protocol:  Relevant documents: relevant documents present and verified  Test results: test results available and properly labeled  Imaging studies: imaging studies available  Required items: required blood products, implants, devices, and special equipment not available  Site marked: the operative site was marked  Patient identity confirmed: Patient identify confirmed verbally with patient.        Time out: Immediately prior to procedure a time out was called to verify the correct patient, procedure, equipment, support staff and site/side marked as required      Procedures Details:   Indications: pain   Prep: chlorhexidine  Patient position: prone  Estimated Blood Loss: minimal  Specimens: none  Number of Levels: 1  Approach: midline  Guidance: fluoroscopy  Contrast: Procedure confirmed with contrast under live fluoroscopy.  Needle and Epidural Catheter: tuohy  Needle size: 22 G  Patient tolerance: Patient tolerated the procedure well with no immediate complications. Pressure was applied, and hemostasis was accomplished.  Comments: DESCRIPTION OF PROCEDURE:  The procedure risks and benefits were explained and informed consent was obtained for the patient.  The patient was positioned prone on the fluoroscopy table with a pillow under the abdomen to help reduce lumbar lordosis.  Blood pressure cuff and oxygen saturation monitors were attached to the patient and was monitored throughout the procedure.  The T1 vertebral level was identified with use of fluoroscopy in the AP view.  The skin was prepped with chlorhexadine and draped in aseptic fashion.  The skin and subcutaneous tissue was anesthetized using 4 mL of 1 percent lidocaine with 25-gauge needle.  A 3-1/2 inch 18-gauge Tuohy needle slowly advanced using AP view towards the superior aspect of the T1 lamina.  The needle was advanced to the os of the T1 lamina and loss-of-resistance syringe was attached.  The needle was then redirected superiorly and advanced using loss-of-resistance technique.  Loss of resistance was obtained, and after negative aspiration, a total of 1 mL of Isovue contrast dye was injected and demonstrated epidural spread.  A 4 mL solution containing 2 mL of normal saline and 80 mg triamcinolone was injected in increments.   was injected in increments.  The stylet was reinserted and needle was removed. After the procedure, the patient's blood pressure, heart rate, oxygen saturation, and VAS pain score were recorded in the chart.      There were no complications.  The patient tolerated the procedure well and was brought to the recovery room for observation in stable condition and discharge with written discharge instructions.     Plan of care:  The patient is to followup in  the [facility] in 2-3 weeks.     The patient was advised to contact the [facility] for any of the following  1. Fever, chills or night sweats.  2. New onset severe sharp pain.  3. Any new upper or lower extremity weakness or numbness.  4. Any questions regarding the procedure.    If unable to contact the [facility], the patient was instructed to go the local emergency room.   This patient's clinical history, exam, AND imaging support radiculopathy AND there is a significant impact on quality of life and function AND the pain has been present for at least 4 weeks AND they have failed to improve with noninvasive conservative care.          Estimated blood loss: none or minimal  Specimens: none  Patient tolerated the procedure well with no immediate complications. Pressure was applied, and hemostasis was accomplished.

## 2022-07-06 NOTE — Progress Notes
SPINE CENTER  INTERVENTIONAL PAIN PROCEDURE HISTORY AND PHYSICAL    No chief complaint on file.      HISTORY OF PRESENT ILLNESS:  Cindy Randolph is a 62 y.o. year old female who presents for injection.  Denies fevers, chills, or recent hospitalizations.  Patient denies currently taking blood thinning medications.         Medical History:   Diagnosis Date    Accidental fall 02/23/22    Knee gave out and fell down stairs    Acquired hypothyroidism     Anxiety 2016    from pain    Back pain     Breast cancer in female [facility]) 12/2020    Left    Cancer of thyroid (HCC) 1980    Degenerative disc disease, cervical 1994    Degenerative disc disease, lumbar 2010    Degenerative disc disease, thoracic 2010    Depression situational, transient    after divorce, after death of 2nd husband    Essential hypertension began in 3rd trimester most pregnancies    remained after last pregnancy    Essential hypertension, benign as above    Family history of coronary artery disease in brother 06/17/2022    04/01/2022- Per OV Dr. Maisie Fus     Generalized headaches 1996-2010    probably hormonally related    GERD (gastroesophageal reflux disease)     H/O total thyroidectomy 05/24/1978    Heart murmur at birth    Dr. Maisie Fus said he didn't hear it a few years ago    High cholesterol     History of bilateral mastectomy 06/22/2022    History of external beam radiation therapy 07/15/2021    History of thyroid cancer 06/22/2022    TSH 0.39 and pt not taking med regularly until about 2 weeks ago.  Will have her repeat TSH in 3 weeks and f/u after.  Needs to be between 0.1 and 0.3.  Will refer her back to Endo for f/u.    Incontinence 3/23    With coughing and sneezing and strong sudden mivemwnts    Infection     Joint pain 1994    Limb alert care status     No access on R-arm    Osteoporosis 03/2021    Bilat hips    Other malignant neoplasm without specification of site thyroid, 1980    thyroidectomy    Spinal stenosis 2017 to present    per MRIs    Thyroid disorder as noted    Torn meniscus 12/25/2020    Ulcer 1987    with divorce; resolved    Unspecified deficiency anemia 2009    from excessive bleeding post-partum; treated iron       Surgical History:   Procedure Laterality Date    HX WRIST FRACTURE SURGERY  1993    Baker's thumb with fixation    ARTHROPLASTY  2001    L ACL    COLONOSCOPY  2017    normal    ARTHROSCOPY KNEE WITH PARTIAL LATERAL MENISCECTOMY AND LEFT KNEE INJECTION. Right 01/08/2021    Performed by Tanja Port, MD at [facility]    BILATERAL TOTAL MASTECTOMIES Bilateral 01/22/2021    Performed by Massie Kluver, MD at [facility]    INTRAOPERATIVE SENTINEL LYMPH NODE IDENTIFICATION WITH/ WITHOUT NON-RADIOACTIVE DYE INJECTION Left 01/22/2021    Performed by Massie Kluver, MD at [facility]    INJECTION RADIOACTIVE TRACER FOR SENTINEL NODE IDENTIFICATION Left 01/22/2021  Performed by Massie Kluver, MD at [facility]    LEFT AXILLARY SENTINEL LYMPH NODE BIOPSY Left 01/22/2021    Performed by Massie Kluver, MD at [facility]    BILATERAL CHEST FLAT CLOSURE Bilateral 01/22/2021    Performed by Stevenson Clinch, MD at [facility]    BILATERAL CHEST FLAT CLOSURE x 8 Bilateral 01/22/2021    Performed by Stevenson Clinch, MD at [facility]    EXCISION BENIGN LESION 0.5 CM OR LESS - TORSO Right 01/22/2021    Performed by Stevenson Clinch, MD at [facility]    TUNNELED VENOUS PORT PLACEMENT Right 02/19/2021    Placement of port-a-cath - 76F Right 02/19/2021    Performed by Freund, Alecia Lemming., MD at [facility]    FLUOROSCOPIC GUIDANCE CENTRAL VENOUS ACCESS DEVICE PLACEMENT/ REPLACEMENT/ REMOVAL N/A 02/19/2021    Performed by Carloyn Manner., MD at [facility]    Left Completion Axillary Lymph Node Dissection Left 05/14/2021    Performed by Massie Kluver, MD at [facility]    REMOVAL TUNNELED CENTRAL VENOUS ACCESS DEVICE INCLUDING PORT/ PUMP Right 05/14/2021    Performed by Massie Kluver, MD at [facility]    COLONOSCOPY DIAGNOSTIC WITH SPECIMEN COLLECTION BY BRUSHING/ WASHING - FLEXIBLE N/A 10/14/2021    Performed by Benetta Spar, MD at [facility]    ESOPHAGOGASTRODUODENOSCOPY WITH SPECIMEN COLLECTION BY BRUSHING/ WASHING N/A 06/17/2022    Performed by Remigio Eisenmenger, MD at [facility]    ESOPHAGOGASTRODUODENOSCOPY WITH DILATION ESOPHAGUS WITH BALLOON 30 MM OR GREATER - FLEXIBLE N/A 06/17/2022    Performed by Remigio Eisenmenger, MD at [facility]    ECHOCARDIOGRAM PROCEDURE      FRACTURE SURGERY  1993    ORIF L 1st metacarpal    KNEE SURGERY  L ACL as above    THYROIDECTOMY         family history includes Alcohol abuse in her father; Arthritis in her mother and paternal grandmother; Arthritis-osteo in her paternal grandmother; Asthma in her brother; Back pain in her brother, brother, brother, mother, and paternal grandmother; Basal Cell Carcinoma in her brother, brother, and brother; Birth Defect in her daughter and nephew; Cancer in her mother; Physiological scientist (age of onset: 32) in her mother; Cancer-Breast (age of onset: 34) in her maternal great-aunt; Diabetes in her father, maternal grandfather, paternal grandfather, and paternal grandmother; Heart Disease in her father; Heart problem in her father; Hypertension in her brother, brother, brother, father, and mother; Joint Pain in her mother; Miscarriages in her mother; Neck Pain in her mother; Stroke in her maternal uncle; Thyroid Disease in her maternal uncle and maternal uncle.    Social History     Socioeconomic History    Marital status: Married    Number of children: 8   Occupational History    Occupation: Retired Engineer, civil (consulting)     Comment: Worked in Multimedia programmer; also worked in the ambulatory setting   Tobacco Use    Smoking status: Never     Passive exposure: Past    Smokeless tobacco: Never    Tobacco comments:     Significant second hand smoke from parents 52-74 years old   Vaping Use    Vaping Use: Never used   Substance and Sexual Activity    Alcohol use: Not Currently     Comment: Prior to 2020, socially, one drink once a week at most  Drug use: Never     Comment: in late teens, early twenties, a few times and not since    Sexual activity: Yes     Partners: Male     Birth control/protection: Post-menopausal, None       Allergies   Allergen Reactions    Oxycodone NAUSEA ONLY     Prefers tramadol    Sudafed [Pseudoephedrine Hcl] PALPITATIONS       Vitals:    07/06/22 0939   BP: 116/80   Pulse: 70   Temp: 36.4 ?C (97.5 ?F)   SpO2: 97%   O2 Device: None (Room air)        Oswestry Total Score:: 48    REVIEW OF SYSTEMS: 10 point ROS obtained and negative except neck pain      PHYSICAL EXAM:  General: 62 y.o. female appears stated age, in no acute distress  HEENT: Normocephalic, atraumatic  Neck: No thyroidmegaly  Cardiovascular: Well perfused  Pulmonary: Unlabored respirations  Extremities: No cyanosis, clubbing, or edema  Skin: No lesions seen on exposed skin  Psychiatric:  Appropriate mood and affect  Musculoskeletal: No atrophy.   Neurologic: Antigravity strength in all extremities. CN II -XII grossly intact.  Alert and oriented x 3.           IMPRESSION:    1. Cervical radiculopathy         PLAN: Cervical Epidural Steroid Injection C7-T1

## 2022-07-06 NOTE — Discharge Instructions - Supplementary Instructions
..  General Post-Procedure Instructions: Pain Injection      Procedure Completed Today:  Joint Injection (hip, knee, shoulder)  Epidural Steroid Injection (Cervical; Thoracic; Lumbar)  Transforaminal Steroid Injection (Cervical; Lumbar)  Facet Joint Injection  Other: ___________________________    Important information following your procedure today:  You may drive today     If you had sedation, you may NOT drive today  Rest at home for the next 6 hours.  You may then begin to resume your normal activities.  DO NOT drive any vehicle, operate any power tools, drink alcohol, make any major decisions, or sign any legal documents for the next 12 hours.  Pain relief may not be immediate. It is possible you may even experience an increase in pain during the first 24-48 hours followed by a gradual decrease of your pain.  Though the procedure is generally safe, and complications are rare, we do ask that you be aware of any of the following:  Any swelling, persistent redness, new bleeding or drainage from the site of the injection.  You should not experience a severe headache.  You should not run a fever over 101oF.  New onset of sharp, severe back and or neck pain.  New onset of upper or lower extremity numbness or weakness.  New difficulty controlling bowel or bladder function after injection.  New shortness of breath.  If any of these occur, please call to report this occurrence to a nurse at [phone]. If you are calling after 4:00pm, on the weekends or holidays please call [phone] and ask to have the pain management resident physician on call for the physician paged or go to your local emergency room.   You may experience soreness at the injection site. Ice can be applied at 20-minute intervals for the first 24 hours. The following day you may alternate ice with heat if you are experiencing muscle tightness, otherwise continue with ice. Ice works best at decreasing pain. Avoid application of direct heat, hot showers or hot tubs today.  Avoid strenuous activity today. You many resume your regular activities and exercise tomorrow.  Patients with diabetes may see an elevation in blood sugars for 7-10 days after the injection. It is important to pay close attention to your diet, check your blood sugars daily and report extreme elevations to the physician that treats your diabetes.  Patients taking daily blood thinners can resume their regular dose this evening.  It is important that you take all medications ordered by your pain physician. Taking medications as ordered is an important part of your pain care plan. If you cannot continue the medication plan, please notify the physician.    Possible side effects to steroids that may occur:  Flushing or redness of the face  Irritability  Fluid retention  Change in women's menses    Follow up appointment as needed. In the event you are unable to keep an appointment, please notify the scheduler 24 hours in advance at [phone].

## 2022-07-12 ENCOUNTER — Encounter: Admit: 2022-07-12 | Discharge: 2022-07-12 | Payer: MEDICARE

## 2022-07-12 ENCOUNTER — Ambulatory Visit: Admit: 2022-07-12 | Discharge: 2022-07-12 | Payer: MEDICARE

## 2022-07-12 DIAGNOSIS — C50919 Malignant neoplasm of unspecified site of unspecified female breast: Secondary | ICD-10-CM

## 2022-07-12 DIAGNOSIS — Z789 Other specified health status: Secondary | ICD-10-CM

## 2022-07-12 DIAGNOSIS — S83209A Unspecified tear of unspecified meniscus, current injury, unspecified knee, initial encounter: Secondary | ICD-10-CM

## 2022-07-12 DIAGNOSIS — R32 Unspecified urinary incontinence: Secondary | ICD-10-CM

## 2022-07-12 DIAGNOSIS — Z8249 Family history of ischemic heart disease and other diseases of the circulatory system: Secondary | ICD-10-CM

## 2022-07-12 DIAGNOSIS — C801 Malignant (primary) neoplasm, unspecified: Secondary | ICD-10-CM

## 2022-07-12 DIAGNOSIS — C73 Malignant neoplasm of thyroid gland: Secondary | ICD-10-CM

## 2022-07-12 DIAGNOSIS — R011 Cardiac murmur, unspecified: Secondary | ICD-10-CM

## 2022-07-12 DIAGNOSIS — F419 Anxiety disorder, unspecified: Secondary | ICD-10-CM

## 2022-07-12 DIAGNOSIS — E7800 Pure hypercholesterolemia, unspecified: Secondary | ICD-10-CM

## 2022-07-12 DIAGNOSIS — F32A Depression: Secondary | ICD-10-CM

## 2022-07-12 DIAGNOSIS — M810 Age-related osteoporosis without current pathological fracture: Secondary | ICD-10-CM

## 2022-07-12 DIAGNOSIS — E890 Postprocedural hypothyroidism: Secondary | ICD-10-CM

## 2022-07-12 DIAGNOSIS — M2550 Pain in unspecified joint: Secondary | ICD-10-CM

## 2022-07-12 DIAGNOSIS — W19XXXA Unspecified fall, initial encounter: Secondary | ICD-10-CM

## 2022-07-12 DIAGNOSIS — Z923 Personal history of irradiation: Secondary | ICD-10-CM

## 2022-07-12 DIAGNOSIS — S99921A Unspecified injury of right foot, initial encounter: Secondary | ICD-10-CM

## 2022-07-12 DIAGNOSIS — D539 Nutritional anemia, unspecified: Secondary | ICD-10-CM

## 2022-07-12 DIAGNOSIS — S92504D Nondisplaced unspecified fracture of right lesser toe(s), subsequent encounter for fracture with routine healing: Secondary | ICD-10-CM

## 2022-07-12 DIAGNOSIS — I10 Essential (primary) hypertension: Secondary | ICD-10-CM

## 2022-07-12 DIAGNOSIS — M549 Dorsalgia, unspecified: Secondary | ICD-10-CM

## 2022-07-12 DIAGNOSIS — R519 Generalized headaches: Secondary | ICD-10-CM

## 2022-07-12 DIAGNOSIS — M4800 Spinal stenosis, site unspecified: Secondary | ICD-10-CM

## 2022-07-12 DIAGNOSIS — Z85850 Personal history of malignant neoplasm of thyroid: Secondary | ICD-10-CM

## 2022-07-12 DIAGNOSIS — Z9013 Acquired absence of bilateral breasts and nipples: Secondary | ICD-10-CM

## 2022-07-12 DIAGNOSIS — B999 Unspecified infectious disease: Secondary | ICD-10-CM

## 2022-07-12 DIAGNOSIS — IMO0002 Ulcer: Secondary | ICD-10-CM

## 2022-07-12 DIAGNOSIS — M5134 Other intervertebral disc degeneration, thoracic region: Secondary | ICD-10-CM

## 2022-07-12 DIAGNOSIS — E039 Hypothyroidism, unspecified: Secondary | ICD-10-CM

## 2022-07-12 DIAGNOSIS — E079 Disorder of thyroid, unspecified: Secondary | ICD-10-CM

## 2022-07-12 DIAGNOSIS — K219 Gastro-esophageal reflux disease without esophagitis: Secondary | ICD-10-CM

## 2022-07-12 DIAGNOSIS — M5136 Other intervertebral disc degeneration, lumbar region: Secondary | ICD-10-CM

## 2022-07-12 DIAGNOSIS — M5030 Other cervical disc degeneration, unspecified cervical region: Secondary | ICD-10-CM

## 2022-07-12 NOTE — Patient Instructions
Please do not hesitate to contact my office with any questions.    Dr. Rosina Lowenstein, Tobin Chad Atoka PA-C - Orthopedic Surgeon, Sports Medicine  [facility] [place] [phone] - Fax [phone]   [address], [place], [place]    To schedule an appointment, please call our scheduling line at [place], RN - Clinical Nurse Coordinator  Lupita Leash BSN, RN - Clinical Nurse Coordinator  Rondel Jumbo ATC, LAT - Clinical Athletic Trainer    Thank you for supporting our practice! Your feedback helps [place] deliver the highest quality of care.  Review [place] at: [email or web address]

## 2022-07-14 ENCOUNTER — Encounter: Admit: 2022-07-14 | Discharge: 2022-07-14 | Payer: MEDICARE

## 2022-07-14 ENCOUNTER — Ambulatory Visit: Admit: 2022-07-14 | Discharge: 2022-07-14 | Payer: MEDICARE

## 2022-07-14 DIAGNOSIS — R079 Chest pain, unspecified: Secondary | ICD-10-CM

## 2022-07-14 DIAGNOSIS — Z9221 Personal history of antineoplastic chemotherapy: Secondary | ICD-10-CM

## 2022-07-14 MED ORDER — RP DX N-13 AMMONIA MCI
20 | Freq: Once | INTRAVENOUS | 0 refills | Status: CP
Start: 2022-07-14 — End: ?
  Administered 2022-07-14: 16:00:00 20.6 via INTRAVENOUS

## 2022-07-14 MED ORDER — AMINOPHYLLINE 25 MG/ML IV SOLN
50 mg | INTRAVENOUS | 0 refills | Status: DC | PRN
Start: 2022-07-14 — End: 2022-07-14

## 2022-07-14 MED ORDER — REGADENOSON 0.4 MG/5 ML IV SYRG
.4 mg | Freq: Once | INTRAVENOUS | 0 refills | Status: CP
Start: 2022-07-14 — End: ?
  Administered 2022-07-14: 15:00:00 0.4 mg via INTRAVENOUS

## 2022-07-14 MED ORDER — ALBUTEROL SULFATE 90 MCG/ACTUATION IN HFAA
2 | RESPIRATORY_TRACT | 0 refills | Status: DC | PRN
Start: 2022-07-14 — End: 2022-07-14

## 2022-07-14 MED ORDER — EUCALYPTUS-MENTHOL MM LOZG
1 | Freq: Once | ORAL | 0 refills | Status: DC | PRN
Start: 2022-07-14 — End: 2022-07-14

## 2022-07-14 MED ORDER — NITROGLYCERIN 0.4 MG SL SUBL
.4 mg | SUBLINGUAL | 0 refills | Status: DC | PRN
Start: 2022-07-14 — End: 2022-07-14

## 2022-07-14 MED ORDER — SODIUM CHLORIDE 0.9 % IV SOLP
250 mL | INTRAVENOUS | 0 refills | Status: DC | PRN
Start: 2022-07-14 — End: 2022-07-14

## 2022-07-15 ENCOUNTER — Encounter: Admit: 2022-07-15 | Discharge: 2022-07-15 | Payer: MEDICARE

## 2022-07-15 ENCOUNTER — Ambulatory Visit: Admit: 2022-07-15 | Discharge: 2022-07-15 | Payer: MEDICARE

## 2022-07-15 DIAGNOSIS — R9439 Abnormal result of other cardiovascular function study: Secondary | ICD-10-CM

## 2022-07-15 DIAGNOSIS — I10 Essential (primary) hypertension: Secondary | ICD-10-CM

## 2022-07-15 DIAGNOSIS — Z114 Encounter for screening for human immunodeficiency virus [HIV]: Secondary | ICD-10-CM

## 2022-07-15 DIAGNOSIS — Z1159 Encounter for screening for other viral diseases: Secondary | ICD-10-CM

## 2022-07-15 LAB — CBC
HEMATOCRIT: 40 % (ref 36–45)
HEMOGLOBIN: 13 g/dL (ref 12.0–15.0)
MCH: 31 pg (ref 26–34)
MCHC: 33 g/dL (ref 32.0–36.0)
MCV: 94 FL (ref 80–100)
MPV: 8.9 FL (ref 7–11)
PLATELET COUNT: 205 K/UL (ref 150–400)
RBC COUNT: 4.2 M/UL (ref 4.0–5.0)
RDW: 13 % (ref >60)
WBC COUNT: 7.1 K/UL (ref 4.5–11.0)

## 2022-07-15 LAB — HIV 1 & 2 AG-AB SCRN W REFLEX TO HIV CONFIRMATION

## 2022-07-15 LAB — BASIC METABOLIC PANEL
POTASSIUM: 4 MMOL/L (ref 3.5–5.1)
SODIUM: 136 MMOL/L — ABNORMAL LOW (ref 137–147)

## 2022-07-15 LAB — HEPATITIS C ANTIBODY W REFLEX HCV PCR QUANT

## 2022-07-15 MED ORDER — ROSUVASTATIN 10 MG PO TAB
10 mg | ORAL | 0 refills | 90.00000 days | Status: AC
Start: 2022-07-15 — End: ?
  Filled 2022-07-18: qty 30, 30d supply, fill #1

## 2022-07-15 MED ORDER — TEMAZEPAM 15 MG PO CAP
15 mg | Freq: Every evening | ORAL | 0 refills | PRN
Start: 2022-07-15 — End: ?

## 2022-07-15 MED ORDER — ALUMINUM-MAGNESIUM HYDROXIDE 200-200 MG/5 ML PO SUSP
30 mL | ORAL | 0 refills | PRN
Start: 2022-07-15 — End: ?

## 2022-07-15 MED ORDER — NITROGLYCERIN 0.4 MG SL SUBL
.4 mg | SUBLINGUAL | 0 refills | PRN
Start: 2022-07-15 — End: ?

## 2022-07-15 MED ORDER — MAGNESIUM HYDROXIDE 400 MG/5 ML PO SUSP
30 mL | ORAL | 0 refills | Status: AC | PRN
Start: 2022-07-15 — End: ?

## 2022-07-15 MED ORDER — ASPIRIN 325 MG PO TAB
325 mg | Freq: Once | ORAL | 0 refills
Start: 2022-07-15 — End: ?

## 2022-07-15 MED ORDER — ACETAMINOPHEN 325 MG PO TAB
650 mg | ORAL | 0 refills | PRN
Start: 2022-07-15 — End: ?

## 2022-07-15 NOTE — Progress Notes
Medicare is listed as patient's primary insurance coverage.  Pre-certification is not required for hospitalizations.

## 2022-07-15 NOTE — Telephone Encounter
I called the patient and went over pre procedure instructions for heart catheterization scheduled for 07/23/2022. Patient verbalized understanding of all instructions. She had no further questions. She will review [facility] as well.

## 2022-07-16 ENCOUNTER — Encounter: Admit: 2022-07-16 | Discharge: 2022-07-16 | Payer: MEDICARE

## 2022-07-16 ENCOUNTER — Emergency Department: Admit: 2022-07-16 | Discharge: 2022-07-16 | Payer: MEDICARE

## 2022-07-16 ENCOUNTER — Observation Stay: Admit: 2022-07-16 | Discharge: 2022-07-16 | Payer: MEDICARE

## 2022-07-16 DIAGNOSIS — R079 Chest pain, unspecified: Secondary | ICD-10-CM

## 2022-07-16 DIAGNOSIS — C50112 Malignant neoplasm of central portion of left female breast: Secondary | ICD-10-CM

## 2022-07-16 DIAGNOSIS — I200 Unstable angina: Secondary | ICD-10-CM

## 2022-07-16 LAB — HIGH SENSITIVITY TROPONIN I 0 HOUR: HIGH SENSITIVITY TROPONIN I 0 HOUR: 4 ng/L (ref <12)

## 2022-07-16 LAB — CBC AND DIFF
ABSOLUTE BASO COUNT: 0 K/UL (ref 0–0.20)
ABSOLUTE EOS COUNT: 0.1 K/UL (ref 0–0.45)
ABSOLUTE MONO COUNT: 0.6 K/UL (ref 0–0.80)
MDW (MONOCYTE DISTRIBUTION WIDTH): 17 (ref <20.7)
WBC COUNT: 6.9 K/UL (ref 4.5–11.0)

## 2022-07-16 LAB — COMPREHENSIVE METABOLIC PANEL
ALBUMIN: 4.7 g/dL (ref 3.5–5.0)
ALK PHOSPHATASE: 45 U/L — ABNORMAL LOW (ref 25–110)
ALT: 18 U/L (ref 7–56)
ANION GAP: 9 K/UL (ref 3–12)
AST: 20 U/L (ref 7–40)
CALCIUM: 9.3 mg/dL (ref 8.5–10.6)
CO2: 27 MMOL/L (ref 21–30)
CREATININE: 0.8 mg/dL (ref 0.4–1.00)
EGFR: >60 mL/min (ref >60)
SODIUM: 136 MMOL/L — ABNORMAL LOW (ref 137–147)
TOTAL BILIRUBIN: 0.5 mg/dL (ref 0.3–1.2)
TOTAL PROTEIN: 7.3 g/dL (ref 6.0–8.0)

## 2022-07-16 LAB — HIGH SENSITIVITY TROPONIN I 2 HOUR: HIGH SENSITIVITY TROPONIN I 2 HOUR: 4 ng/L (ref <12)

## 2022-07-16 LAB — PHOSPHORUS: PHOSPHORUS: 3.4 mg/dL (ref 2.0–4.5)

## 2022-07-16 LAB — D-DIMER: D-DIMER: 457 ng{FEU}/mL (ref <500)

## 2022-07-16 LAB — MAGNESIUM: MAGNESIUM: 2.1 mg/dL (ref 1.6–2.6)

## 2022-07-16 MED ORDER — DICLOFENAC SODIUM 1 % TP GEL
4 g | Freq: Every day | TOPICAL | 0 refills | Status: AC | PRN
Start: 2022-07-16 — End: ?

## 2022-07-16 MED ORDER — ESCITALOPRAM OXALATE 10 MG PO TAB
15 mg | Freq: Every day | ORAL | 0 refills | Status: AC
Start: 2022-07-16 — End: ?
  Administered 2022-07-17 – 2022-07-18 (×2): 15 mg via ORAL

## 2022-07-16 MED ORDER — ENOXAPARIN 40 MG/0.4 ML SC SYRG
40 mg | Freq: Every day | SUBCUTANEOUS | 0 refills | Status: AC
Start: 2022-07-16 — End: ?
  Administered 2022-07-17 – 2022-07-18 (×2): 40 mg via SUBCUTANEOUS

## 2022-07-16 MED ORDER — MELATONIN 5 MG PO TAB
5 mg | Freq: Every evening | ORAL | 0 refills | Status: AC | PRN
Start: 2022-07-16 — End: ?

## 2022-07-16 MED ORDER — SENNOSIDES-DOCUSATE SODIUM 8.6-50 MG PO TAB
1 | Freq: Every day | ORAL | 0 refills | Status: AC | PRN
Start: 2022-07-16 — End: ?

## 2022-07-16 MED ORDER — SUMATRIPTAN SUCCINATE 25 MG PO TAB
25 mg | ORAL | 0 refills | Status: CN | PRN
Start: 2022-07-16 — End: ?

## 2022-07-16 MED ORDER — ROSUVASTATIN 20 MG PO TAB
10 mg | ORAL | 0 refills | Status: AC
Start: 2022-07-16 — End: ?
  Administered 2022-07-17: 14:00:00 10 mg via ORAL

## 2022-07-16 MED ORDER — KETOROLAC 15 MG/ML IJ SOLN
15 mg | Freq: Once | INTRAVENOUS | 0 refills | Status: CP
Start: 2022-07-16 — End: ?
  Administered 2022-07-16: 20:00:00 15 mg via INTRAVENOUS

## 2022-07-16 MED ORDER — ATORVASTATIN 40 MG PO TAB
80 mg | Freq: Every day | ORAL | 0 refills | Status: DC
Start: 2022-07-16 — End: 2022-07-17
  Administered 2022-07-16: 22:00:00 80 mg via ORAL

## 2022-07-16 MED ORDER — TRAMADOL 50 MG PO TAB
25 mg | Freq: Two times a day (BID) | ORAL | 0 refills | Status: AC | PRN
Start: 2022-07-16 — End: ?
  Administered 2022-07-18: 01:00:00 25 mg via ORAL

## 2022-07-16 MED ORDER — ACETAMINOPHEN 325 MG PO TAB
650 mg | ORAL | 0 refills | Status: AC | PRN
Start: 2022-07-16 — End: ?
  Administered 2022-07-17 – 2022-07-18 (×4): 650 mg via ORAL

## 2022-07-16 MED ORDER — FENTANYL CITRATE (PF) 50 MCG/ML IJ SOLN
50 ug | Freq: Once | INTRAVENOUS | 0 refills | Status: CP
Start: 2022-07-16 — End: ?
  Administered 2022-07-16: 16:00:00 50 ug via INTRAVENOUS

## 2022-07-16 MED ORDER — ONDANSETRON 4 MG PO TBDI
4 mg | ORAL | 0 refills | Status: AC | PRN
Start: 2022-07-16 — End: ?

## 2022-07-16 MED ORDER — ACETAMINOPHEN 500 MG PO TAB
1000 mg | Freq: Once | ORAL | 0 refills | Status: CP
Start: 2022-07-16 — End: ?
  Administered 2022-07-16: 16:00:00 1000 mg via ORAL

## 2022-07-16 MED ORDER — SODIUM CHLORIDE 0.9 % IV SOLP
INTRAVENOUS | 0 refills | Status: AC
Start: 2022-07-16 — End: ?

## 2022-07-16 MED ORDER — EXEMESTANE 25 MG PO TAB
25 mg | Freq: Every day | ORAL | 0 refills | Status: AC
Start: 2022-07-16 — End: ?
  Administered 2022-07-18: 02:00:00 25 mg via ORAL

## 2022-07-16 MED ORDER — ONDANSETRON HCL (PF) 4 MG/2 ML IJ SOLN
4 mg | INTRAVENOUS | 0 refills | Status: AC | PRN
Start: 2022-07-16 — End: ?

## 2022-07-16 MED ORDER — PANTOPRAZOLE 40 MG PO TBEC
40 mg | Freq: Every day | ORAL | 0 refills | Status: AC
Start: 2022-07-16 — End: ?

## 2022-07-16 MED ORDER — LEVOTHYROXINE 112 MCG PO TAB
112 ug | Freq: Every day | ORAL | 0 refills | Status: AC
Start: 2022-07-16 — End: ?
  Administered 2022-07-17 – 2022-07-18 (×2): 112 ug via ORAL

## 2022-07-16 MED ORDER — LISINOPRIL 2.5 MG PO TAB
2.5 mg | Freq: Two times a day (BID) | ORAL | 0 refills | Status: AC
Start: 2022-07-16 — End: ?
  Administered 2022-07-17 – 2022-07-18 (×4): 2.5 mg via ORAL

## 2022-07-16 MED ORDER — POLYETHYLENE GLYCOL 3350 17 GRAM PO PWPK
1 | Freq: Every day | ORAL | 0 refills | Status: AC | PRN
Start: 2022-07-16 — End: ?
  Administered 2022-07-17: 12:00:00 17 g via ORAL

## 2022-07-16 NOTE — Telephone Encounter
-----   Message from Lurline Del, MD sent at 07/16/2022  9:17 AM CST -----  Fairly stable, can proceed,     Thanks  Swathi  ----- Message -----  From: Luretha Murphy, RN  Sent: 07/16/2022   7:09 AM CST  To: Lurline Del, MD; #    Dr. Cephus Slater-     Pre-cath labs   Sodium decreased   Please review/advise   Thank you  Anderson [place]

## 2022-07-16 NOTE — H&P (View-Only)
History and Physical    Cindy Randolph, Cindy Randolph is a 62 y.o. female admitted on 07/16/2022.    Chief Complaint: chest pain, HA    Assessment/Plan     Allex, [name] is a 62 y.o. 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       Chest pain  HTN  HLD  - prior hx:   - has been having several months of exertional chest pain that improves w/ rest; also w/ intermittent palpitations. Pt does have hx of coronary artery calcification noted on prior imaging. Has previously been on doxorubicin for mgmt of breast ca. Pt follows w/ cardiology at [facility]. Last seen 06/28/22. Given results of PET MPI below, had recommended getting LHC (scheduled for 03/01).   - 02/21 PET MPI:   - Heart size upper limits of normal with qualitatively mild coronary artery atherosclerotic calcification demonstrating no reversible ischemia.   - Mild hypokinesis of the inferior apical wall, with decreased wall thickening and decreased radiotracer activity on both stress and rest imaging, suggestive of small prior infarction. Additional area of matched mildly decreased activity in the mid septal wall, with septal wall demonstrating dyskinesia.   - Mildly decreased ejection fraction on stress that 42%.   - Normal quantitative myocardial blood flow.    - Nonischemic ECG response to pharmacologic stress test  - 02/21 TTE:   Small sized left ventricle.  Moderately increased LV wall thickness, consistent with concentric remodeling.  Isolated prominent basal septal hypertrophy.   Normal left ventricular systolic function with estimated ejection fraction of 55% by biplane Simpson's method. No regional wall motion abnormalities.  Mild global longitudinal strain at -22%  Normal diastolic function with normal estimated left atrial pressure.  Normal sized right ventricle. Normal right ventricular systolic function.  Normal sized left atrium. Normal sized right atrium..   Mild mitral and aortic regurgitation  Moderately dilated ascending aorta at 4.2 cm.  No evidence of pulmonary hypertension with estimated PA systolic pressure of 24 mmHg  No pericardial effusion.  - this admission, pt presented w/ acute onset of lt side chest pain/pressure, radiating up the lt side of the neck, dyspnea, anxiety that began night before admission. Pt took asa 325mg  x1 and presented to ED  - in ED: hemodynamically stable/nml v/s, labs  nml w/ neg trop x2. ECG no concerning for ischemia. BNP nml  - 02/23 CXR: no acute findings  Plan:  - continue pta meds: lisionpril 2.5mg  bid, rosuvastatin 10mg  q48hrs  - consulting cardiology; pt is getting LHC 02/23    HA  - pt has hx of HA; takes sumatriptan 25mg  q2hrs prn for max of 200mg  in 24hr period  - this admission, presenting w/ acute onset of HA, along w/ the chest pain as above; HA located behind her eyes  - 02/23 CT head wo cont:   1.  No acute intracranial hemorrhage or mass effect.   2.  Mild nonspecific cerebral white matter disease, most likely secondary to chronic microvascular ischemic changes.   Plan:  - for severe/persistent HA, can try migraine cocktail  - holding on triptan for now given concern for heart disease as above    Hypothyroidism  Hx thyroid  ca s/p total thyroidectomy  - 04/2022 TSH nml  Plan:  - continue pta levothyroxine    Hx of breast ca s/p bilateral mastectomy/ radiation/ chemo  - continue pta exemestane 25mg     Depression/anxiety  - continue pta lexapro 15mg  daily      DVT ppx: lovenox    Fluids: na  Electrolytes: monitor in am; monitor prn   Nutrition: cardiac  PT/OT: na  Consults: cards      Dispo: anticipate d/c to home pending resolution of chest pain, cardiac work up (?02/24-02/26)      High medical decision making due to the following:  1 or more chronic illness with severe exacerbation  Review of notes outside of my specialty, Review of each unique test, and Ordering of each unique test and discussion of management or test interpretation with   (physician(s) or other qualified health care professional outside of my specialty)      Staff name:  Marcine Matar, MD Date:  07/16/2022   Med-private Team Swing 3  Team Pager     Available on [place], [place]    Voalte is the preferred method of communication.   Please use the Med Private First Call for all patient-related communications. Personal Voaltes and pagers are not answered at all hours.      HPI     Pt presented to the ED w/ acute onset of chest pain, dyspnea, palpitations. Associated w/ HA (sharp, behind eyes); no lightheadedness/dizziness, vision changes. Some nausea, but no emesis; some abd discomfort (intermittent). Chest pain is deep, pressure; located in the lt side of the chest, radiating up the neck. Associated w/ some dyspnea. Also w/ some palpitations.  Has been having this problem for 58mo or so now. Has been following w/ cardiology as outpt. Just had stress test and a TTE. TTE was nml. Stress, however, was abnml. Pt is scheduled to get a LHC on 03/01.       Past Medical History:  Medical History:   Diagnosis Date    Accidental fall 02/23/22    Knee gave out and fell down stairs    Acquired hypothyroidism     Anxiety 2016    from pain    Back pain     Breast cancer in female [facility]) 12/2020    Left    Cancer of thyroid (HCC) 1980    Degenerative disc disease, cervical 1994    Degenerative disc disease, lumbar 2010    Degenerative disc disease, thoracic 2010    Depression situational, transient    after divorce, after death of 2nd husband    Essential hypertension began in 3rd trimester most pregnancies    remained after last pregnancy    Essential hypertension, benign as above    Family history of coronary artery disease in brother 06/17/2022    04/01/2022- Per OV Dr. Maisie Fus     Generalized headaches 1996-2010    probably hormonally related    GERD (gastroesophageal reflux disease)     H/O total thyroidectomy 05/24/1978    Heart murmur at birth    Dr. Maisie Fus said he didn't hear it a few years ago    High cholesterol     History of bilateral mastectomy 06/22/2022    History of external beam radiation therapy 07/15/2021    History of thyroid cancer 06/22/2022    TSH 0.39 and pt not taking med regularly until about 2 weeks ago.  Will have her repeat TSH in 3 weeks and f/u after.  Needs to be between 0.1 and 0.3.  Will refer her back to Endo for f/u.    Incontinence 3/23    With coughing and sneezing and strong sudden mivemwnts    Infection     Joint pain 1994    Limb alert care status     No access on R-arm    Osteoporosis 03/2021    Bilat hips    Other malignant neoplasm without specification of site thyroid, 1980    thyroidectomy    Spinal stenosis 2017 to present    per MRIs    Thyroid disorder as noted    Torn meniscus 12/25/2020    Ulcer 1987    with divorce; resolved    Unspecified deficiency anemia 2009    from excessive bleeding post-partum; treated iron       Surgical History:   Procedure Laterality Date    HX WRIST FRACTURE SURGERY  1993    Baker's thumb with fixation    ARTHROPLASTY  2001    L ACL    COLONOSCOPY  2017    normal    ARTHROSCOPY KNEE WITH PARTIAL LATERAL MENISCECTOMY AND LEFT KNEE INJECTION. Right 01/08/2021    Performed by Tanja Port, MD at [facility]    BILATERAL TOTAL MASTECTOMIES Bilateral 01/22/2021    Performed by Massie Kluver, MD at [facility]    INTRAOPERATIVE SENTINEL LYMPH NODE IDENTIFICATION WITH/ WITHOUT NON-RADIOACTIVE DYE INJECTION Left 01/22/2021    Performed by Massie Kluver, MD at [facility]    INJECTION RADIOACTIVE TRACER FOR SENTINEL NODE IDENTIFICATION Left 01/22/2021    Performed by Massie Kluver, MD at [facility]    LEFT AXILLARY SENTINEL LYMPH NODE BIOPSY Left 01/22/2021    Performed by Massie Kluver, MD at [facility]    BILATERAL CHEST FLAT CLOSURE Bilateral 01/22/2021    Performed by Stevenson Clinch, MD at [facility]    BILATERAL CHEST FLAT CLOSURE x 8 Bilateral 01/22/2021    Performed by Stevenson Clinch, MD at [facility]    EXCISION BENIGN LESION 0.5 CM OR LESS - TORSO Right 01/22/2021    Performed by Stevenson Clinch, MD at [facility]    TUNNELED VENOUS PORT PLACEMENT Right 02/19/2021    Placement of port-a-cath - 71F Right 02/19/2021    Performed by Freund, Alecia Lemming., MD at [facility]    FLUOROSCOPIC GUIDANCE CENTRAL VENOUS ACCESS DEVICE PLACEMENT/ REPLACEMENT/ REMOVAL N/A 02/19/2021    Performed by Carloyn Manner., MD at [facility] OR    Left Completion Axillary Lymph Node Dissection Left 05/14/2021    Performed by Massie Kluver, MD at [facility]    REMOVAL TUNNELED CENTRAL VENOUS ACCESS DEVICE INCLUDING PORT/ PUMP Right 05/14/2021    Performed by Massie Kluver, MD at [facility]    COLONOSCOPY DIAGNOSTIC WITH SPECIMEN COLLECTION BY BRUSHING/ WASHING - FLEXIBLE N/A 10/14/2021    Performed by Benetta Spar, MD at [facility]    ESOPHAGOGASTRODUODENOSCOPY WITH SPECIMEN COLLECTION BY BRUSHING/ WASHING N/A 06/17/2022    Performed by Remigio Eisenmenger, MD at [facility]    ESOPHAGOGASTRODUODENOSCOPY WITH DILATION ESOPHAGUS WITH BALLOON 30 MM OR GREATER - FLEXIBLE N/A 06/17/2022    Performed by Remigio Eisenmenger, MD at [facility]    ACL RECONSTRUCTION  06/03/1999    ECHOCARDIOGRAM PROCEDURE      FRACTURE SURGERY  1993    ORIF L 1st metacarpal  HX CARPAL TUNNEL RELEASE  1996    With second and other oregbnancies    KNEE SURGERY  L ACL as above    THYROIDECTOMY         Medications:  Current Facility-Administered Medications on File Prior to Encounter   Medication Dose Route Frequency Provider Last Rate Last Admin    milk of magnesium oral suspension 30 mL  30 mL Oral Q6H PRN Orpah Cobb, MD         Current Outpatient Medications on File Prior to Encounter   Medication Sig Dispense Refill    acetaminophen (TYLENOL) 325 mg tablet Take two tablets by mouth every 6 hours. Take scheduled for 3 days after surgery, then as needed. Do not exceed 4,000mg  in a 24 hour period. 40 tablet 0    anastrozole (ARIMIDEX) 1 mg tablet Take one tablet by mouth daily. (Patient not taking: Reported on 07/16/2022)      aspirin 325 mg tablet Take one tablet by mouth every 8 hours.      biotin 1 mg cap Take one capsule by mouth daily.      CALCIUM PO Take 600 mg by mouth daily.      CHOLEcalciferoL (vitamin D3) 1,000 units tablet Take one tablet by mouth daily.      clobetasoL (TEMOVATE) 0.05 % topical ointment Apply  topically to affected area twice daily. Apply to areas of rash on hands twice daily; do NOT use on face/groin/underarms, use up to 2 weeks per month 60 g 3    diclofenac sodium (VOLTAREN) 1 % topical gel Apply four g topically to affected area daily as needed.      diclofenac sodium DR (VOLTAREN) 50 mg tablet Take one tablet by mouth twice daily. Take with food.      escitalopram oxalate (LEXAPRO) 10 mg tablet Take 1.5 tablets by mouth daily.      exemestane (AROMASIN) 25 mg tablet Take one tablet by mouth daily. Take after a meal. 30 tablet 11    Fish,Bora,Flax Oils-OM3,6,9 #1 (TRIPLE OMEGA) 400-400-400 mg capsule Take one capsule by mouth daily.      glucosam/chond-msm1/C/mang/bor (GLUCOSAMINE-CHOND-MSM COMPLEX PO) Take 1 capsule by mouth daily.      lisinopriL (ZESTRIL) 5 mg tablet Take one tablet by mouth daily. (Patient taking differently: Take one-half tablet by mouth twice daily.) 90 tablet 1    melatonin 5 mg tablet Take one tablet by mouth at bedtime as needed.      omeprazole DR (PRILOSEC) 40 mg capsule Take one capsule by mouth daily.      phytonadione (vitamin K1) 5 mg tablet Take one tablet by mouth once.      rosuvastatin (CRESTOR) 10 mg tablet Take one tablet by mouth every 48 hours.      Selenium 100 mcg tab Take one tablet by mouth daily.      SUMAtriptan succinate (IMITREX) 25 mg tablet Take one tablet by mouth at onset of headache. May repeat after 2 hours if needed. Max of 200 mg in 24 hours. 6 tablet 0    SYNTHROID 112 mcg tablet Take one tablet by mouth daily 30 minutes before breakfast. 90 tablet 3    traMADoL 25 mg tablet Take one tablet by mouth every 12 hours as needed. 30 tablet 0    vitamins, B complex tab Take one tablet by mouth daily.      vitamins, multiple cap Take one capsule by mouth daily.      zinc sulfate 220 mg (  50 mg elemental zinc) capsule Take one capsule by mouth daily.         Allergies:  Allergies   Allergen Reactions    Oxycodone NAUSEA ONLY     Prefers tramadol    Sudafed [Pseudoephedrine Hcl] PALPITATIONS         Family History:  Family History   Problem Relation Age of Onset    Arthritis Paternal Grandmother         multiple heberdens nodules and deformities    Back pain Paternal Grandmother     Arthritis-osteo Paternal Grandmother     Diabetes Paternal Grandmother         Type 2 as older adult    Arthritis Mother         wear and tear    Back pain Mother     Hypertension Mother     Joint Pain Mother     Neck Pain Mother     Cancer-Breast Mother 34        was on HRT for 10-15 years    Cancer Mother         Breast CA, post menopausal, estrogen-sensitive    Miscarriages Mother         3-5 and a stillbirth    Basal Cell Carcinoma Brother     Back pain Brother     Hypertension Brother     Asthma Brother         Worst when a child    Basal Cell Carcinoma Brother     Back pain Brother         has had surgery    Hypertension Brother     Basal Cell Carcinoma Brother     Back pain Brother     Hypertension Brother     Diabetes Father         Type 2    Heart problem Father         CABG 3    Heart Disease Father     Hypertension Father     Alcohol abuse Father     Diabetes Maternal Grandfather         Type 2 as older adult    Birth Defect Daughter         PRS, congenital diaphragmatic hernia, malrotation, grey matter heterotopia    Stroke Maternal Uncle         In his 20s    Thyroid Disease Maternal Uncle     Diabetes Paternal Grandfather     Birth Defect Nephew         craniosynostosis    Thyroid Disease Maternal Uncle     Cancer-Breast Maternal Great-Aunt 34       Social History:  Social History     Socioeconomic History    Marital status: Married    Number of children: 8   Occupational History    Occupation: Retired Engineer, civil (consulting)     Comment: Worked in Multimedia programmer; also worked in the ambulatory setting   Tobacco Use    Smoking status: Never     Passive exposure: Past    Smokeless tobacco: Never    Tobacco comments:     Significant second hand smoke from parents 1-11 years old   Vaping Use    Vaping status: Never Used   Substance and Sexual Activity    Alcohol use: Not Currently     Comment: Prior to 2020, socially, one drink once a week at most  Drug use: Never     Comment: in late teens, early twenties, a few times and not since    Sexual activity: Yes     Partners: Male     Birth control/protection: Post-menopausal, None         Review of Systems:  Review of Systems   Constitutional:  Negative for chills, diaphoresis, fever, malaise/fatigue and weight loss.   HENT: Negative.     Eyes:  Positive for pain (HA behind eye). Negative for blurred vision, double vision, photophobia and redness.   Respiratory:  Positive for shortness of breath. Negative for cough, hemoptysis, sputum production and wheezing.    Cardiovascular:  Positive for chest pain and palpitations. Negative for orthopnea, claudication, leg swelling and PND.   Gastrointestinal:  Positive for abdominal pain (intermittent; chronic) and nausea (intermittent; chronic). Negative for heartburn and vomiting.   Genitourinary:  Negative for dysuria and flank pain.   Musculoskeletal: Negative.    Skin: Negative.    Neurological:  Positive for headaches. Negative for dizziness, tingling, tremors, focal weakness, seizures, loss of consciousness and weakness.   Endo/Heme/Allergies: Negative. Psychiatric/Behavioral: Negative.         Physical Exam:  Vital signs:  Vitals:    07/16/22 1130 07/16/22 1149 07/16/22 1330 07/16/22 1347   BP: 125/86  108/79    BP Source:       Pulse: 71 67 68 69   Temp:       SpO2: 95% 96% 96% 94%   O2 Device:           No intake or output data in the 24 hours ending 07/16/22 1451    Physical exam:  General: NAD  Cardiovascular: RRR, no murmurs.   Pulmonary/Chest: normal respiratory effort. Bilateral breath sounds are clear. No wheezes or rales or rhonchi.  Abdominal: Soft, nondistended, nontender, and no mass. Bowel sounds are normal.  Ext: No pitting edema in extremities. Nml distal pulses. No cyanosis, no clubbing  Neurological: A&Ox4, no focal findings  Skin: Warm and dry. No rash noted. No erythema. No pallor.    Labs and imaging reviewed  24-hour labs:    Results for orders placed or performed during the hospital encounter of 07/16/22 (from the past 24 hour(s))   CBC AND DIFF    Collection Time: 07/16/22  9:49 AM   Result Value Ref Range    White Blood Cells 6.9 4.5 - 11.0 K/UL    RBC 4.12 4.0 - 5.0 M/UL    Hemoglobin 13.2 12.0 - 15.0 GM/DL    Hematocrit 16.1 36 - 45 %    MCV 94.4 80 - 100 FL    MCH 32.2 26 - 34 PG    MCHC 34.1 32.0 - 36.0 G/DL    RDW 09.6 11 - 15 %    Platelet Count 188 150 - 400 K/UL    MPV 8.3 7 - 11 FL    Neutrophils 67 41 - 77 %    Lymphocytes 21 (L) 24 - 44 %    Monocytes 10 4 - 12 %    Eosinophils 2 0 - 5 %    Basophils 0 0 - 2 %    Absolute Neutrophil Count 4.60 1.8 - 7.0 K/UL    Absolute Lymph Count 1.43 1.0 - 4.8 K/UL    Absolute Monocyte Count 0.67 0 - 0.80 K/UL    Absolute Eosinophil Count 0.16 0 - 0.45 K/UL    Absolute Basophil Count 0.03 0 - 0.20 K/UL  MDW (Monocyte Distribution Width) 17.1 <20.7   COMPREHENSIVE METABOLIC PANEL    Collection Time: 07/16/22  9:49 AM   Result Value Ref Range    Sodium 136 (L) 137 - 147 MMOL/L    Potassium 3.9 3.5 - 5.1 MMOL/L    Chloride 100 98 - 110 MMOL/L    Glucose 94 70 - 100 MG/DL    Blood Urea Nitrogen 9 7 - 25 MG/DL    Creatinine 1.61 0.4 - 1.00 MG/DL    Calcium 9.3 8.5 - 09.6 MG/DL    Total Protein 7.3 6.0 - 8.0 G/DL    Total Bilirubin 0.5 0.3 - 1.2 MG/DL    Albumin 4.7 3.5 - 5.0 G/DL    Alk Phosphatase 45 25 - 110 U/L    AST (SGOT) 20 7 - 40 U/L    CO2 27 21 - 30 MMOL/L    ALT (SGPT) 18 7 - 56 U/L    Anion Gap 9 3 - 12    eGFR >60 >60 mL/min   MAGNESIUM    Collection Time: 07/16/22  9:49 AM   Result Value Ref Range    Magnesium 2.1 1.6 - 2.6 mg/dL   PHOSPHORUS    Collection Time: 07/16/22  9:49 AM   Result Value Ref Range    Phosphorus 3.4 2.0 - 4.5 MG/DL   HIGH SENSITIVITY TROPONIN I 0 HOUR    Collection Time: 07/16/22  9:49 AM   Result Value Ref Range    hs Troponin I 0 Hour 4 <12 ng/L   NT-PRO-BNP    Collection Time: 07/16/22  9:49 AM   Result Value Ref Range    NT-Pro-BNP 50.0 <125 pg/mL   D-DIMER    Collection Time: 07/16/22  9:49 AM   Result Value Ref Range    D-Dimer 457 <500 ng/mL FEU   HIGH SENSITIVITY TROPONIN I 2 HOUR    Collection Time: 07/16/22 11:51 AM   Result Value Ref Range    hs Troponin I 2 Hour 4 <12 ng/L

## 2022-07-16 NOTE — Telephone Encounter
07/16/22 @ 0926am- Received the below response from Dr. Cephus Slater:       ===View-only below this line===  ----- Message -----  From: Lurline Del, MD  Sent: 07/16/2022   9:18 AM CST  To: Luretha Murphy, RN; *  Subject: RE: Chest pain, dizziness                        Thanks for letting me know.     Swathi

## 2022-07-16 NOTE — ED Notes
Ordered food for pt at this time.

## 2022-07-16 NOTE — Progress Notes
Medicare Primary No pre-certification is required.

## 2022-07-16 NOTE — Telephone Encounter
07/16/22 @ 0933am- Pt notified via [facility] of the below results/recommendations from Dr. Cephus Slater:         ===View-only below this line===  ----- Message -----  From: Lurline Del, MD  Sent: 07/16/2022   9:17 AM CST  To: Luretha Murphy, RN; *    Fairly stable, can proceed,     Thanks  Swathi

## 2022-07-16 NOTE — Progress Notes
Pre-Procedure Airway Assessment     Planned Procedure: LHC, Cors with possible PCI    Time of last oral intake: 8 hrs    Assessment: Airway assessment performed    Note: If any factors present an anesthesia consult should be considered    Malampatti: II    ASA Class: ASA III (A patient with a severe systemic disease that limits activity, but is not incapacitating)    Physician has discussed risks and alternatives of this type sedation and above planned procedure(s) with: Patient    Allergies:   Allergies   Allergen Reactions    Oxycodone NAUSEA ONLY     Prefers tramadol    Sudafed [Pseudoephedrine Hcl] PALPITATIONS        Current Medications: Reviewed    Appropriate labs/diagnostic tests: Reviewed    Have the patient or anyone in the patient's family ever had a history of sedation/anesthesia complications? No

## 2022-07-16 NOTE — ED Provider Notes
Cindy Randolph is a 62 y.o. female.    Chief Complaint:  Chief Complaint   Patient presents with    Chest Pain     Started last night with soa, nausea and dizziness. Possible stent placement planned per pt. Intermittent x several days but worse last night. Mid sternal with radiation to left neck    Headache     Fullness since last night        History of Present Illness:  Cindy Randolph is a 62 y.o. female, with a history of hypothyroidism, HTN, breast cancer on treatment who presents to the emergency department for chest pain and a HA. The patient states that starting last night, she began to experience an sudden onset of worsening sternal area chest pressure that radiates into the left side of the neck, shortness of breath that feels like her chest is closing in , HA present behind the eyes that is uncommon for her to experience at baseline, heaviness in the bilateral lower extremities, dizziness, and a sensation of palpitations/flutter as well. The patient states that she has previously experienced this chest pressure and palpitations intermittently over the past year, however, states that the presence of the other symptoms she is experiencing is new for her. The patient states that at the initial onset of her symptoms, she found momentary relief by taking deep breaths but reports that her symptoms appeared with increasing severity this morning PTA to the [facility]. Currently, the patient states that her chest pressure and HA are the most present symptoms at the moment, rating them a 6/10 and 5/10 on the pain scale, respectively. Of note, the patient states that she recently had a PET scan done here at [place] on 07/14/22 and was told there was some ischemia in the one of the ventricles of her heart possibly indicative of a past infarct. The patient is concerned that her symptoms are possibly a warning for a more serious complication and presents to the [facility] at this time. The patient denies any nausea or vomiting. The patient endorses usage of 325mg  Aspirin this morning PTA to the [facility] with minimal relief of her symptoms.      History provided by:  Patient  Language interpreter used: No    Chest Pain  Associated symptoms: dizziness, headache and shortness of breath    Associated symptoms: no abdominal pain, no back pain and no fever    Headache  Associated symptoms: dizziness    Associated symptoms: no abdominal pain, no back pain, no fever and no sore throat        Review of Systems:  Review of Systems   Constitutional:  Negative for fever.   HENT:  Negative for sore throat.    Eyes:  Negative for visual disturbance.   Respiratory:  Positive for chest tightness and shortness of breath.    Cardiovascular:  Positive for chest pain.   Gastrointestinal:  Negative for abdominal pain.   Genitourinary:  Negative for dysuria.   Musculoskeletal:  Negative for back pain.   Skin:  Negative for rash.   Neurological:  Positive for dizziness and headaches.       Allergies:  Oxycodone and Sudafed [pseudoephedrine hcl]    Past Medical History:  Medical History:   Diagnosis Date    Accidental fall 02/23/22    Knee gave out and fell down stairs    Acquired hypothyroidism     Anxiety 2016    from pain    Back pain  Breast cancer in female [facility]) 12/2020    Left    Cancer of thyroid (HCC) 1980    Degenerative disc disease, cervical 1994    Degenerative disc disease, lumbar 2010    Degenerative disc disease, thoracic 2010    Depression situational, transient    after divorce, after death of 2nd husband    Essential hypertension began in 3rd trimester most pregnancies    remained after last pregnancy    Essential hypertension, benign as above    Family history of coronary artery disease in brother 06/17/2022    04/01/2022- Per OV Dr. Maisie Fus     Generalized headaches 1996-2010    probably hormonally related    GERD (gastroesophageal reflux disease)     H/O total thyroidectomy 05/24/1978    Heart murmur at birth    Dr. Maisie Fus said he didn't hear it a few years ago    High cholesterol     History of bilateral mastectomy 06/22/2022    History of external beam radiation therapy 07/15/2021    History of thyroid cancer 06/22/2022    TSH 0.39 and pt not taking med regularly until about 2 weeks ago.  Will have her repeat TSH in 3 weeks and f/u after.  Needs to be between 0.1 and 0.3.  Will refer her back to Endo for f/u.    Incontinence 3/23    With coughing and sneezing and strong sudden mivemwnts    Infection     Joint pain 1994    Limb alert care status     No access on R-arm    Osteoporosis 03/2021    Bilat hips    Other malignant neoplasm without specification of site thyroid, 1980    thyroidectomy    Spinal stenosis 2017 to present    per MRIs    Thyroid disorder as noted    Torn meniscus 12/25/2020    Ulcer 1987    with divorce; resolved    Unspecified deficiency anemia 2009    from excessive bleeding post-partum; treated iron       Past Surgical History:  Surgical History:   Procedure Laterality Date    HX WRIST FRACTURE SURGERY  1993    Baker's thumb with fixation    ARTHROPLASTY  2001    L ACL    COLONOSCOPY  2017    normal    ARTHROSCOPY KNEE WITH PARTIAL LATERAL MENISCECTOMY AND LEFT KNEE INJECTION. Right 01/08/2021    Performed by Tanja Port, MD at [facility] OR    BILATERAL TOTAL MASTECTOMIES Bilateral 01/22/2021    Performed by Massie Kluver, MD at [facility] OR    INTRAOPERATIVE SENTINEL LYMPH NODE IDENTIFICATION WITH/ WITHOUT NON-RADIOACTIVE DYE INJECTION Left 01/22/2021    Performed by Massie Kluver, MD at [facility] OR    INJECTION RADIOACTIVE TRACER FOR SENTINEL NODE IDENTIFICATION Left 01/22/2021    Performed by Massie Kluver, MD at [facility]    LEFT AXILLARY SENTINEL LYMPH NODE BIOPSY Left 01/22/2021    Performed by Massie Kluver, MD at [facility]    BILATERAL CHEST FLAT CLOSURE Bilateral 01/22/2021    Performed by Stevenson Clinch, MD at [facility] OR    BILATERAL CHEST FLAT CLOSURE x 8 Bilateral 01/22/2021    Performed by Stevenson Clinch, MD at [facility] OR    EXCISION BENIGN LESION 0.5 CM OR LESS - TORSO Right 01/22/2021    Performed by Stevenson Clinch, MD at [facility] OR  TUNNELED VENOUS PORT PLACEMENT Right 02/19/2021    Placement of port-a-cath - 73F Right 02/19/2021    Performed by Freund, Alecia Lemming., MD at [facility]    FLUOROSCOPIC GUIDANCE CENTRAL VENOUS ACCESS DEVICE PLACEMENT/ REPLACEMENT/ REMOVAL N/A 02/19/2021    Performed by Flo Shanks Alecia Lemming., MD at [facility] OR    Left Completion Axillary Lymph Node Dissection Left 05/14/2021    Performed by Massie Kluver, MD at [facility]    REMOVAL TUNNELED CENTRAL VENOUS ACCESS DEVICE INCLUDING PORT/ PUMP Right 05/14/2021    Performed by Massie Kluver, MD at [facility]    COLONOSCOPY DIAGNOSTIC WITH SPECIMEN COLLECTION BY BRUSHING/ WASHING - FLEXIBLE N/A 10/14/2021    Performed by Benetta Spar, MD at [facility]    ESOPHAGOGASTRODUODENOSCOPY WITH SPECIMEN COLLECTION BY BRUSHING/ WASHING N/A 06/17/2022    Performed by Remigio Eisenmenger, MD at [facility] ENDO    ESOPHAGOGASTRODUODENOSCOPY WITH DILATION ESOPHAGUS WITH BALLOON 30 MM OR GREATER - FLEXIBLE N/A 06/17/2022    Performed by Remigio Eisenmenger, MD at [facility] ENDO    ACL RECONSTRUCTION  06/03/1999    ECHOCARDIOGRAM PROCEDURE      FRACTURE SURGERY  1993    ORIF L 1st metacarpal    HX CARPAL TUNNEL RELEASE  1996    With second and other oregbnancies    KNEE SURGERY  L ACL as above    THYROIDECTOMY         Pertinent medical/surgical history reviewed  Medical History:   Diagnosis Date    Accidental fall 02/23/22    Knee gave out and fell down stairs    Acquired hypothyroidism     Anxiety 2016    from pain    Back pain     Breast cancer in female [facility]) 12/2020    Left    Cancer of thyroid (HCC) 1980    Degenerative disc disease, cervical 1994    Degenerative disc disease, lumbar 2010    Degenerative disc disease, thoracic 2010    Depression situational, transient    after divorce, after death of 2nd husband    Essential hypertension began in 3rd trimester most pregnancies    remained after last pregnancy    Essential hypertension, benign as above    Family history of coronary artery disease in brother 06/17/2022    04/01/2022- Per OV Dr. Maisie Fus     Generalized headaches 1996-2010    probably hormonally related    GERD (gastroesophageal reflux disease)     H/O total thyroidectomy 05/24/1978    Heart murmur at birth    Dr. Maisie Fus said he didn't hear it a few years ago    High cholesterol     History of bilateral mastectomy 06/22/2022    History of external beam radiation therapy 07/15/2021    History of thyroid cancer 06/22/2022    TSH 0.39 and pt not taking med regularly until about 2 weeks ago.  Will have her repeat TSH in 3 weeks and f/u after.  Needs to be between 0.1 and 0.3.  Will refer her back to Endo for f/u.    Incontinence 3/23    With coughing and sneezing and strong sudden mivemwnts    Infection     Joint pain 1994    Limb alert care status     No access on R-arm    Osteoporosis 03/2021    Bilat hips    Other malignant neoplasm without specification of site thyroid, 1980  thyroidectomy    Spinal stenosis 2017 to present    per MRIs    Thyroid disorder as noted    Torn meniscus 12/25/2020    Ulcer 1987    with divorce; resolved    Unspecified deficiency anemia 2009    from excessive bleeding post-partum; treated iron     Surgical History:   Procedure Laterality Date    HX WRIST FRACTURE SURGERY  1993    Baker's thumb with fixation    ARTHROPLASTY  2001    L ACL    COLONOSCOPY  2017    normal    ARTHROSCOPY KNEE WITH PARTIAL LATERAL MENISCECTOMY AND LEFT KNEE INJECTION. Right 01/08/2021    Performed by Tanja Port, MD at [facility] OR    BILATERAL TOTAL MASTECTOMIES Bilateral 01/22/2021    Performed by Massie Kluver, MD at [facility] OR    INTRAOPERATIVE SENTINEL LYMPH NODE IDENTIFICATION WITH/ WITHOUT NON-RADIOACTIVE DYE INJECTION Left 01/22/2021    Performed by Massie Kluver, MD at [facility] OR    INJECTION RADIOACTIVE TRACER FOR SENTINEL NODE IDENTIFICATION Left 01/22/2021    Performed by Massie Kluver, MD at [facility] OR    LEFT AXILLARY SENTINEL LYMPH NODE BIOPSY Left 01/22/2021    Performed by Massie Kluver, MD at [facility]    BILATERAL CHEST FLAT CLOSURE Bilateral 01/22/2021    Performed by Stevenson Clinch, MD at [facility]    BILATERAL CHEST FLAT CLOSURE x 8 Bilateral 01/22/2021    Performed by Stevenson Clinch, MD at [facility]    EXCISION BENIGN LESION 0.5 CM OR LESS - TORSO Right 01/22/2021    Performed by Stevenson Clinch, MD at [facility] OR    TUNNELED VENOUS PORT PLACEMENT Right 02/19/2021    Placement of port-a-cath - 58F Right 02/19/2021    Performed by Freund, Alecia Lemming., MD at [facility]    FLUOROSCOPIC GUIDANCE CENTRAL VENOUS ACCESS DEVICE PLACEMENT/ REPLACEMENT/ REMOVAL N/A 02/19/2021    Performed by Carloyn Manner., MD at [facility] OR    Left Completion Axillary Lymph Node Dissection Left 05/14/2021    Performed by Massie Kluver, MD at [facility]    REMOVAL TUNNELED CENTRAL VENOUS ACCESS DEVICE INCLUDING PORT/ PUMP Right 05/14/2021    Performed by Massie Kluver, MD at [facility] OR    COLONOSCOPY DIAGNOSTIC WITH SPECIMEN COLLECTION BY BRUSHING/ WASHING - FLEXIBLE N/A 10/14/2021    Performed by Benetta Spar, MD at [facility]    ESOPHAGOGASTRODUODENOSCOPY WITH SPECIMEN COLLECTION BY BRUSHING/ WASHING N/A 06/17/2022    Performed by Remigio Eisenmenger, MD at [facility] ENDO    ESOPHAGOGASTRODUODENOSCOPY WITH DILATION ESOPHAGUS WITH BALLOON 30 MM OR GREATER - FLEXIBLE N/A 06/17/2022    Performed by Remigio Eisenmenger, MD at [facility] ENDO    ACL RECONSTRUCTION  06/03/1999    ECHOCARDIOGRAM PROCEDURE      FRACTURE SURGERY  1993    ORIF L 1st metacarpal    HX CARPAL TUNNEL RELEASE  1996    With second and other oregbnancies    KNEE SURGERY  L ACL as above    THYROIDECTOMY         Social History:  Social History     Tobacco Use    Smoking status: Never     Passive exposure: Past    Smokeless tobacco: Never    Tobacco comments:     Significant second hand smoke from parents 69-66 years old  Vaping Use    Vaping status: Never Used   Substance Use Topics    Alcohol use: Not Currently     Comment: Prior to 2020, socially, one drink once a week at most    Drug use: Never     Comment: in late teens, early twenties, a few times and not since     Social History     Substance and Sexual Activity   Drug Use Never    Comment: in late teens, early twenties, a few times and not since             Family History:  Family History   Problem Relation Age of Onset    Arthritis Paternal Grandmother         multiple heberdens nodules and deformities    Back pain Paternal Grandmother     Arthritis-osteo Paternal Grandmother     Diabetes Paternal Grandmother         Type 2 as older adult    Arthritis Mother         wear and tear    Back pain Mother     Hypertension Mother     Joint Pain Mother     Neck Pain Mother     Cancer-Breast Mother 60        was on HRT for 10-15 years    Cancer Mother         Breast CA, post menopausal, estrogen-sensitive    Miscarriages Mother         3-5 and a stillbirth    Basal Cell Carcinoma Brother     Back pain Brother     Hypertension Brother     Asthma Brother         Worst when a child    Basal Cell Carcinoma Brother     Back pain Brother         has had surgery    Hypertension Brother     Basal Cell Carcinoma Brother     Back pain Brother     Hypertension Brother     Diabetes Father         Type 2    Heart problem Father         CABG 3    Heart Disease Father     Hypertension Father     Alcohol abuse Father     Diabetes Maternal Grandfather         Type 2 as older adult    Birth Defect Daughter         PRS, congenital diaphragmatic hernia, malrotation, grey matter heterotopia    Stroke Maternal Uncle         In his 86s    Thyroid Disease Maternal Uncle     Diabetes Paternal Grandfather     Birth Defect Nephew         craniosynostosis    Thyroid Disease Maternal Uncle     Cancer-Breast Maternal Great-Aunt 74       Vitals:  ED Vitals      Date and Time T BP P RR SPO2P SPO2 User 07/16/22 1530 -- 122/84 70 12 PER MINUTE -- 95 % HA   07/16/22 1347 -- -- 69 15 PER MINUTE -- 94 % LS   07/16/22 1330 -- 108/79 68 14 PER MINUTE -- 96 % LS   07/16/22 1149 -- -- 67 15 PER MINUTE -- 96 % LS   07/16/22 1130 -- 125/86 71 15 PER MINUTE --  95 % LS   07/16/22 1003 -- -- 68 11 PER MINUTE -- 96 % LS   07/16/22 1002 -- 125/82 67 14 PER MINUTE -- 96 % LS   07/16/22 0721 36.6 ?C (97.9 ?F) 133/98 74 16 PER MINUTE -- 98 % RC            Physical Exam:  Physical Exam  Vitals and nursing note reviewed.   Constitutional:       Appearance: Normal appearance. She is normal weight.   HENT:      Head: Normocephalic and atraumatic.   Eyes:      Extraocular Movements: Extraocular movements intact.      Conjunctiva/sclera: Conjunctivae normal.   Cardiovascular:      Rate and Rhythm: Normal rate and regular rhythm.      Pulses: Normal pulses.      Heart sounds: Normal heart sounds.   Pulmonary:      Effort: Pulmonary effort is normal. No respiratory distress.      Breath sounds: Normal breath sounds. No wheezing.   Abdominal:      General: There is no distension.      Palpations: Abdomen is soft.      Tenderness: There is no abdominal tenderness.   Musculoskeletal:         General: Normal range of motion.      Cervical back: Normal range of motion and neck supple. No tenderness.   Neurological:      General: No focal deficit present.      Mental Status: She is alert and oriented to person, place, and time. Mental status is at baseline.      Cranial Nerves: No cranial nerve deficit.      Sensory: No sensory deficit.      Motor: No weakness.   Psychiatric:         Mood and Affect: Mood normal.         Behavior: Behavior normal.         Laboratory Results:  Labs Reviewed   CBC AND DIFF - Abnormal       Result Value Ref Range Status    White Blood Cells 6.9  4.5 - 11.0 K/UL Final    RBC 4.12  4.0 - 5.0 M/UL Final    Hemoglobin 13.2  12.0 - 15.0 GM/DL Final    Hematocrit 16.1  36 - 45 % Final    MCV 94.4  80 - 100 FL Final MCH 32.2  26 - 34 PG Final    MCHC 34.1  32.0 - 36.0 G/DL Final    RDW 09.6  11 - 15 % Final    Platelet Count 188  150 - 400 K/UL Final    MPV 8.3  7 - 11 FL Final    Neutrophils 67  41 - 77 % Final    Lymphocytes 21 (*) 24 - 44 % Final    Monocytes 10  4 - 12 % Final    Eosinophils 2  0 - 5 % Final    Basophils 0  0 - 2 % Final    Absolute Neutrophil Count 4.60  1.8 - 7.0 K/UL Final    Absolute Lymph Count 1.43  1.0 - 4.8 K/UL Final    Absolute Monocyte Count 0.67  0 - 0.80 K/UL Final    Absolute Eosinophil Count 0.16  0 - 0.45 K/UL Final    Absolute Basophil Count 0.03  0 - 0.20 K/UL Final  MDW (Monocyte Distribution Width) 17.1  <20.7 Final   COMPREHENSIVE METABOLIC PANEL - Abnormal    Sodium 136 (*) 137 - 147 MMOL/L Final    Potassium 3.9  3.5 - 5.1 MMOL/L Final    Chloride 100  98 - 110 MMOL/L Final    Glucose 94  70 - 100 MG/DL Final    Blood Urea Nitrogen 9  7 - 25 MG/DL Final    Creatinine 1.61  0.4 - 1.00 MG/DL Final    Calcium 9.3  8.5 - 10.6 MG/DL Final    Total Protein 7.3  6.0 - 8.0 G/DL Final    Total Bilirubin 0.5  0.3 - 1.2 MG/DL Final    Albumin 4.7  3.5 - 5.0 G/DL Final    Alk Phosphatase 45  25 - 110 U/L Final    AST (SGOT) 20  7 - 40 U/L Final    CO2 27  21 - 30 MMOL/L Final    ALT (SGPT) 18  7 - 56 U/L Final    Anion Gap 9  3 - 12 Final    eGFR >60  >60 mL/min Final   MAGNESIUM    Magnesium 2.1  1.6 - 2.6 mg/dL Final   PHOSPHORUS    Phosphorus 3.4  2.0 - 4.5 MG/DL Final   HIGH SENSITIVITY TROPONIN I 0 HOUR    hs Troponin I 0 Hour 4  <12 ng/L Final   HIGH SENSITIVITY TROPONIN I 2 HOUR    hs Troponin I 2 Hour 4  <12 ng/L Final   NT-PRO-BNP    NT-Pro-BNP 50.0  <125 pg/mL Final   D-DIMER    D-Dimer 457  <500 ng/mL FEU Final   LIPID PROFILE   LIPOPROTEIN A          Radiology Interpretation:  CT HEAD WO CONTRAST   Final Result         1.  No acute intracranial hemorrhage or mass effect.   2.  Mild nonspecific cerebral white matter disease, most likely secondary to chronic microvascular ischemic changes.          Finalized by Lafonda Mosses, DO on 07/16/2022 10:08 AM. Dictated by Lafonda Mosses, DO on 07/16/2022 10:03 AM.         CHEST SINGLE VIEW   Final Result         No consolidation.      By my electronic signature, I attest that I have personally reviewed the images for this examination and formulated the interpretations and opinions expressed in this report          Finalized by Arlana Hove, M.D. on 07/16/2022 10:07 AM. Dictated by Jae Dire. Ladona Ridgel, M.D. on 07/16/2022 9:46 AM.             EKG:  ECG Results              [facility] MAIN ECG TRIAGE ONLY (Final result)        Collection Time Result Time VT RATE P-R Interval QRS DURATION Q-T Interval QTC Calc Bazett    07/16/22 07:18:50 07/16/22 09:06:38 78 158 86 374 426             Collection Time Result Time P Axis R Axis T Axis    07/16/22 07:18:50 07/16/22 09:06:38 29 3 2                      Final result  Impression:    Normal sinus rhythm  Minimal voltage criteria for LVH, may be normal variant ( R in aVL )  Confirmed by Bedelia Person ([id number]) on 07/16/2022 9:06:32 AM                                    Medical Decision Making:  Cindy Randolph is a 62 y.o. female who presents with chief complaint as listed above. Based on the history and presentation, the list of differential diagnoses considered included, but was not limited to, ACS, PE, pneumonia, costochondritis.     ED Course    Vitals are hemodynamically stable.  Physical exam as noted above.    While in the ED, patient was given Tylenol and fentanyl for symptom control.    CBC was unremarkable.  CMP showed hyponatremia.  Troponin x 2 was negative.  BNP was 50.  D-dimer was 457; thus excluding PE.    Given headache, CT head with contrast was ordered that showed no acute intracranial hemorrhage or mass effect.  Chest x-ray showed no acute cardiopulmonary findings.    On reevaluation, patient noted improvement in chest pain, but still continues to have some.  Given Toradol for further pain control.    Given moderate risk heart score of 5 as well as persistent pain with plans for upcoming left heart cath outpatient, recommended admission to medicine.  Patient was agreeable to plan.  Patient was admitted to medicine.         Complexity of Problems Addressed  Patient's active diagnoses as well as contributing pre-existing medical problems include:  Clinical Impression   Chest pain, unspecified type   Malignant neoplasm of central portion of left breast in female, estrogen receptor positive      Evaluation performed for potential threat to life or bodily function during this visit given the initial differential diagnosis and clinical impression(s) as discussed previously in MDM/ED course.    Additional data reviewed:    History was obtained from an independent historian: Spouse  Prior non-ED notes reviewed: Recent discharge summary and/or H&P and Clinic note  Independent interpretation of diagnostic tests was performed by me: Not in addition to what is mentioned above  Patient presentation/management was discussed with the following qualified health care professionals and/or other relevant professionals: Admitting Physician    Risk evaluation:    Diagnosis or treatment of patient condition impacted by social determinant of health: None  Tests Considered but not performed due to clinical scoring (if not mentioned in ED course, aside from what is implied by clinical scores listed): na  Rationale regarding whether admission or escalation of care considered if not performed (if not mentioned in ED course, aside from what is implied by clinical scores listed): na    ED Scoring:                      HEART Total Score: 5  Low (0-3 points), with 0.9-1.7% risk over the next 6 weeks.  Moderate (4-7 points), with 12.0-16.6% risk over the next 6 weeks.  High (8-10 points), with 5--65% risk over the next 6 weeks.         Facility Administered Meds:  Medications   atorvastatin (LIPITOR) tablet 80 mg (80 mg Oral Given 07/16/22 1609)   sodium chloride 0.9 %   infusion (has no administration in time range)   fentaNYL citrate PF (SUBLIMAZE) injection 50 mcg (  50 mcg Intravenous Given 07/16/22 1005)   acetaminophen (TYLENOL EXTRA STRENGTH) tablet 1,000 mg (1,000 mg Oral Given 07/16/22 1004)   ketorolac (TORADOL) injection 15 mg (15 mg Intravenous Given 07/16/22 1355)       Clinical Impression:  Clinical Impression   Chest pain, unspecified type   Malignant neoplasm of central portion of left breast in female, estrogen receptor positive        Disposition/Follow up  ED Disposition     ED Disposition   Admit           No follow-up provider specified.    Medications:  New Prescriptions    No medications on file       Procedure Notes:  Procedures       Attestation / Supervision:  I, Henrine Screws, am scribing for and in the presence of Ranelle Oyster, DO.    Henrine Screws    I, Ranelle Oyster, DO, personally performed the services described in this documentation as scribed and it is both accurate and complete.    Ranelle Oyster, DO  PGY-3  Emergency Medicine

## 2022-07-17 ENCOUNTER — Encounter: Admit: 2022-07-17 | Discharge: 2022-07-17 | Payer: MEDICARE

## 2022-07-17 MED ADMIN — ISOSORBIDE MONONITRATE 30 MG PO TB24 [24521]: 60 mg | ORAL | @ 20:00:00 | Stop: 2022-07-17 | NDC 00904644961

## 2022-07-17 MED ADMIN — ASPIRIN 81 MG PO CHEW [680]: 81 mg | ORAL | @ 18:00:00 | NDC 66553000201

## 2022-07-17 MED ADMIN — KETOROLAC 15 MG/ML IJ SOLN [22472]: 15 mg | INTRAVENOUS | @ 12:00:00 | Stop: 2022-07-17 | NDC 25021070001

## 2022-07-17 MED FILL — ISOSORBIDE MONONITRATE 60 MG PO TB24: 60 mg | ORAL | 30 days supply | Qty: 30 | Fill #1 | Status: AC

## 2022-07-17 NOTE — Progress Notes
Coronary angiography performed today showed a large dominant circumflex artery without significant stenosis.  Perhaps mild ectasia throughout the LAD is surprisingly tortuous in the mid and distal portion the mid LAD has a long segment of moderate tandem lesions the worst being about 40 to 50% and there is being about 30% or so and maybe 40%.  None of these appear to be severe in any view.  There is no evidence of dissection or haziness.  No flow is normal    I am not sure that her symptoms that she came in with addressed are related to epicardial coronary artery disease.  Not do a hemodynamic assessment as I am not keen on stenting this long segment of moderate disease.  She does have exertional symptoms as well that are more consistent with angina so either she has microvascular disease to account for those heart disease along moderate lesion is hemodynamically significant.      Would recommend Antianginal therapy with long-acting night trace and perhaps amlodipine to begin with at this time.    Would also recommend checking lipoprotein a and if elevated like her brother starting Repatha.    It may also be beneficial to do an alternate stress test to the PET/CT that did show anterior ischemia such as a treadmill echocardiogram to see if there are EKG changes and symptoms are reproduced.    PCI can certainly be performed but I would prefer to avoid it at this time for moderate diffuse disease in the mid LAD    I had a good conversation with the patient and her husband about this after the procedure and they are in understanding of the findings and my current recommendations.

## 2022-07-18 ENCOUNTER — Encounter: Admit: 2022-07-18 | Discharge: 2022-07-18 | Payer: MEDICARE

## 2022-07-18 MED ADMIN — FENTANYL CITRATE (PF) 50 MCG/ML IJ SOLN [3037]: 25 ug | INTRAVENOUS | @ 05:00:00 | Stop: 2022-07-18 | NDC 00409909412

## 2022-07-18 MED ADMIN — ASPIRIN 81 MG PO CHEW [680]: 81 mg | ORAL | @ 14:00:00 | Stop: 2022-07-18 | NDC 66553000201

## 2022-07-18 MED ADMIN — ISOSORBIDE MONONITRATE 30 MG PO TB24 [24521]: 30 mg | ORAL | @ 14:00:00 | Stop: 2022-07-18 | NDC 00904644961

## 2022-07-18 MED ADMIN — PANTOPRAZOLE 40 MG PO TBEC [80436]: 40 mg | ORAL | @ 02:00:00 | NDC 65862056099

## 2022-07-18 MED ADMIN — ROSUVASTATIN 10 MG PO TAB [88503]: 20 mg | ORAL | @ 14:00:00 | Stop: 2022-07-18 | NDC 00904677961

## 2022-07-18 MED ADMIN — PANTOPRAZOLE 40 MG PO TBEC [80436]: 40 mg | ORAL | @ 17:00:00 | Stop: 2022-07-18 | NDC 65862056099

## 2022-07-18 MED ADMIN — ACETAMINOPHEN/LIDOCAINE/ANTACID DS(#) 1:1:3  PO SUSP [210000]: 30 mL | ORAL | @ 04:00:00 | NDC 54029002209

## 2022-07-18 MED FILL — ISOSORBIDE MONONITRATE 30 MG PO TB24: 30 mg | ORAL | 30 days supply | Qty: 30 | Fill #1 | Status: CP

## 2022-07-18 MED FILL — METOPROLOL SUCCINATE 25 MG PO TB24: 25 mg | ORAL | 90 days supply | Qty: 90 | Fill #1 | Status: CP

## 2022-07-19 ENCOUNTER — Encounter: Admit: 2022-07-19 | Discharge: 2022-07-19 | Payer: MEDICARE

## 2022-07-19 ENCOUNTER — Ambulatory Visit: Admit: 2022-07-19 | Discharge: 2022-07-20 | Payer: MEDICARE

## 2022-07-19 DIAGNOSIS — Z9013 Acquired absence of bilateral breasts and nipples: Secondary | ICD-10-CM

## 2022-07-19 DIAGNOSIS — IMO0002 Ulcer: Secondary | ICD-10-CM

## 2022-07-19 DIAGNOSIS — M5134 Other intervertebral disc degeneration, thoracic region: Secondary | ICD-10-CM

## 2022-07-19 DIAGNOSIS — E079 Disorder of thyroid, unspecified: Secondary | ICD-10-CM

## 2022-07-19 DIAGNOSIS — D539 Nutritional anemia, unspecified: Secondary | ICD-10-CM

## 2022-07-19 DIAGNOSIS — M4800 Spinal stenosis, site unspecified: Secondary | ICD-10-CM

## 2022-07-19 DIAGNOSIS — R32 Unspecified urinary incontinence: Secondary | ICD-10-CM

## 2022-07-19 DIAGNOSIS — W19XXXA Unspecified fall, initial encounter: Secondary | ICD-10-CM

## 2022-07-19 DIAGNOSIS — M549 Dorsalgia, unspecified: Secondary | ICD-10-CM

## 2022-07-19 DIAGNOSIS — K219 Gastro-esophageal reflux disease without esophagitis: Secondary | ICD-10-CM

## 2022-07-19 DIAGNOSIS — F419 Anxiety disorder, unspecified: Secondary | ICD-10-CM

## 2022-07-19 DIAGNOSIS — I10 Essential (primary) hypertension: Secondary | ICD-10-CM

## 2022-07-19 DIAGNOSIS — M5136 Other intervertebral disc degeneration, lumbar region: Secondary | ICD-10-CM

## 2022-07-19 DIAGNOSIS — B999 Unspecified infectious disease: Secondary | ICD-10-CM

## 2022-07-19 DIAGNOSIS — E039 Hypothyroidism, unspecified: Secondary | ICD-10-CM

## 2022-07-19 DIAGNOSIS — Z923 Personal history of irradiation: Secondary | ICD-10-CM

## 2022-07-19 DIAGNOSIS — M810 Age-related osteoporosis without current pathological fracture: Secondary | ICD-10-CM

## 2022-07-19 DIAGNOSIS — G43109 Migraine with aura, not intractable, without status migrainosus: Secondary | ICD-10-CM

## 2022-07-19 DIAGNOSIS — I25119 Atherosclerotic heart disease of native coronary artery with unspecified angina pectoris: Secondary | ICD-10-CM

## 2022-07-19 DIAGNOSIS — E7800 Pure hypercholesterolemia, unspecified: Secondary | ICD-10-CM

## 2022-07-19 DIAGNOSIS — M2550 Pain in unspecified joint: Secondary | ICD-10-CM

## 2022-07-19 DIAGNOSIS — Z8249 Family history of ischemic heart disease and other diseases of the circulatory system: Secondary | ICD-10-CM

## 2022-07-19 DIAGNOSIS — S83209A Unspecified tear of unspecified meniscus, current injury, unspecified knee, initial encounter: Secondary | ICD-10-CM

## 2022-07-19 DIAGNOSIS — R52 Pain, unspecified: Secondary | ICD-10-CM

## 2022-07-19 DIAGNOSIS — Z789 Other specified health status: Secondary | ICD-10-CM

## 2022-07-19 DIAGNOSIS — C50919 Malignant neoplasm of unspecified site of unspecified female breast: Secondary | ICD-10-CM

## 2022-07-19 DIAGNOSIS — E890 Postprocedural hypothyroidism: Secondary | ICD-10-CM

## 2022-07-19 DIAGNOSIS — R011 Cardiac murmur, unspecified: Secondary | ICD-10-CM

## 2022-07-19 DIAGNOSIS — Z09 Encounter for follow-up examination after completed treatment for conditions other than malignant neoplasm: Secondary | ICD-10-CM

## 2022-07-19 DIAGNOSIS — M5030 Other cervical disc degeneration, unspecified cervical region: Secondary | ICD-10-CM

## 2022-07-19 DIAGNOSIS — C73 Malignant neoplasm of thyroid gland: Secondary | ICD-10-CM

## 2022-07-19 DIAGNOSIS — C801 Malignant (primary) neoplasm, unspecified: Secondary | ICD-10-CM

## 2022-07-19 DIAGNOSIS — Z85850 Personal history of malignant neoplasm of thyroid: Secondary | ICD-10-CM

## 2022-07-19 DIAGNOSIS — R519 Generalized headaches: Secondary | ICD-10-CM

## 2022-07-19 DIAGNOSIS — F32A Depression: Secondary | ICD-10-CM

## 2022-07-19 MED ORDER — DULOXETINE 20 MG PO CPDR
20 mg | ORAL_CAPSULE | Freq: Every day | ORAL | 0 refills | 60.00000 days | Status: AC
Start: 2022-07-19 — End: ?

## 2022-07-19 MED ORDER — ESCITALOPRAM OXALATE 10 MG PO TAB
ORAL | 0 refills | Status: AC
Start: 2022-07-19 — End: ?

## 2022-07-19 NOTE — Assessment & Plan Note
Recommended to reduce lexapro to 10 mg for a week then reduce to 5 mg along with start cymbalta 20 mg daily, after a week take cymbalta 20 mg daily to help with generalized body pain.  ESR and CRP ordered to assess for any inflammatory disorders

## 2022-07-19 NOTE — Assessment & Plan Note
Has maybe 2 episodes a year; sometimes has aphasia with episodes that lasts 2 hours and resolves  Takes benadryl and diclofenac BID daily, stopped recently; advised to avoid taking NSAIDs daily  Takes sumatriptan as needed

## 2022-07-19 NOTE — Progress Notes
07/19/2022     Subjective:       History of Present Illness  Cindy Randolph is a 62 y.o. female.  Post-hospital Follow Up     Has history of left breast cancer status post mastectomy in 2022, cervical spine radiculopathy, lumbar degenerative disease, osteoporosis, migraine and depression.     Was admitted between 07/16/2022 and 07/18/2022 for left-sided chest pain with dyspnea and headache.  Cardiologist consulted and she underwent left heart cath on 07/16/2021 which showed 50% lesion at mid LAD and 50% lesion in mid RCA. Was recommended to continue Isorbid 30 mg daily. Also recommended to switch lisinopril to Toprol-XL 25 mg daily. Takes sumatriptan 25 mg as needed for headaches.  Had CT head on 2/23 which showed no acute findings.    Was on diclofenac 50 mg BID, stopped it for a week for PET scan and has not been on it since. Has generalized joint pain. Also takes tramadol for severe pain.               Objective:          acetaminophen (TYLENOL) 325 mg tablet Take two tablets by mouth every 6 hours. Take scheduled for 3 days after surgery, then as needed. Do not exceed 4,000mg  in a 24 hour period.    aspirin 81 mg chewable tablet Chew one tablet by mouth daily.    biotin 1 mg cap Take one capsule by mouth daily.    CALCIUM PO Take 600 mg by mouth daily.    CHOLEcalciferoL (vitamin D3) 1,000 units tablet Take one tablet by mouth daily.    clobetasoL (TEMOVATE) 0.05 % topical ointment Apply  topically to affected area twice daily. Apply to areas of rash on hands twice daily; do NOT use on face/groin/underarms, use up to 2 weeks per month    diclofenac sodium (VOLTAREN) 1 % topical gel Apply four g topically to affected area daily as needed.    diclofenac sodium DR (VOLTAREN) 50 mg tablet Take one tablet by mouth twice daily. Take with food.    duloxetine DR (CYMBALTA) 20 mg capsule Take one capsule by mouth daily.    escitalopram oxalate (LEXAPRO) 10 mg tablet Take one tablet by mouth daily for 7 days, THEN one-half tablet daily for 7 days.    exemestane (AROMASIN) 25 mg tablet Take one tablet by mouth daily. Take after a meal.    Fish,Bora,Flax Oils-OM3,6,9 #1 (TRIPLE OMEGA) 400-400-400 mg capsule Take one capsule by mouth daily.    glucosam/chond-msm1/C/mang/bor (GLUCOSAMINE-CHOND-MSM COMPLEX PO) Take 1 capsule by mouth daily.    isosorbide mononitrate ER (IMDUR) 30 mg tablet,extended release 24 hr Take one tablet by mouth daily.    melatonin 5 mg tablet Take one tablet by mouth at bedtime as needed.    metoprolol succinate XL (TOPROL XL) 25 mg extended release tablet Take one tablet by mouth daily.    omeprazole DR (PRILOSEC) 40 mg capsule Take one capsule by mouth daily.    other medication K Complete K1 & K2 as MK-4 & MK-7 : Take 1 softgel by mouth once daily    rosuvastatin (CRESTOR) 20 mg tablet Take one tablet by mouth daily.    Selenium 100 mcg tab Take one tablet by mouth daily.    SYNTHROID 112 mcg tablet Take one tablet by mouth daily 30 minutes before breakfast.    traMADoL 25 mg tablet Take one tablet by mouth every 12 hours as needed.    vitamins, B complex  tab Take one tablet by mouth daily.    vitamins, multiple cap Take one capsule by mouth daily.    zinc sulfate 220 mg (50 mg elemental zinc) capsule Take one capsule by mouth daily.     Vitals:    07/19/22 1607 07/19/22 1619   BP: 117/69 114/85   BP Source: Arm, Right Upper Arm, Right Upper   Pulse: 82 88   Temp: 36.3 ?C (97.3 ?F)    Resp: 16    SpO2: 95%    TempSrc: Oral    PainSc: Six    Weight: 72.7 kg (160 lb 4.8 oz)    Height: 157.5 cm (5' 2)      Body mass index is 29.32 kg/m?Marland Kitchen     Physical Exam  Vitals reviewed.   HENT:      Head: Normocephalic and atraumatic.   Pulmonary:      Effort: Pulmonary effort is normal.   Skin:     General: Skin is warm and dry.   Neurological:      Mental Status: She is alert and oriented to person, place, and time.   Psychiatric:         Mood and Affect: Mood normal.         Behavior: Behavior normal.         BP Readings from Last 5 Encounters:   07/19/22 114/85   07/18/22 121/85   07/14/22 124/81   07/06/22 (!) 126/91   06/28/22 138/64        Wt Readings from Last 5 Encounters:   07/19/22 72.7 kg (160 lb 4.8 oz)   07/16/22 71.8 kg (158 lb 4.6 oz)   07/14/22 73.5 kg (162 lb)   07/12/22 73.5 kg (162 lb)   06/28/22 73.5 kg (162 lb)        Health Maintenance   Topic Date Due    RSV VACCINE (60 YEARS AND OLDER) (1 - 1-dose 60+ series) Never done    COVID-19 VACCINE (3 - 2023-24 season) 06/23/2023 (Originally 01/22/2022)    SHINGLES RECOMBINANT VACCINE (2 of 2) 08/17/2022    MEDICARE ANNUAL WELLNESS VISIT  02/09/2023    CERVICAL CANCER SCREENING  10/03/2024    DTAP/TDAP VACCINES (3 - Td or Tdap) 11/21/2025    COLORECTAL CANCER SCREENING  10/15/2031    HIV SCREENING  Completed    HEPATITIS C SCREENING  Completed    INFLUENZA VACCINE  Completed    PNEUMOCOCCAL VACCINE 0-64 YRS  Aged Out               Assessment and Plan:    Problem   Generalized Body Aches   Coronary Artery Disease Involving Native Coronary Artery of Native Heart With Angina Pectoris (Hcc)   Recurrent Major Depression in Full Remission (Hcc)   Migraine With Aura and Without Status Migrainosus, Not Intractable   Primary Hypertension       Problem List Items Addressed This Visit          CARDIAC AND VASCULATURE    Primary hypertension     Well controlled  Lisinopril switched to toprol XL by cardiology due to h/o CAD  Advised to check BP regularly and maintain BP log to provide during next visit         Coronary artery disease involving native coronary artery of native heart with angina pectoris (HCC)     Follows with cardiology  Currently on aspirin, Crestor, Toprol-XL and Imdur  Lipoprotein a elevated, states cardiology plans to start  Repatha in the future            MENTAL HEALTH    Recurrent major depression in full remission (HCC)     In remission  Lexapro 15 mg daily switch to duloxetine 20 mg daily to help with generalized pain         Relevant Medications duloxetine DR (CYMBALTA) 20 mg capsule    escitalopram oxalate (LEXAPRO) 10 mg tablet       NEURO    Migraine with aura and without status migrainosus, not intractable     Has maybe 2 episodes a year; sometimes has aphasia with episodes that lasts 2 hours and resolves  Takes benadryl and diclofenac BID daily, stopped recently; advised to avoid taking NSAIDs daily  Takes sumatriptan as needed            SYMPTOMS AND SIGNS    Generalized body aches     Recommended to reduce lexapro to 10 mg for a week then reduce to 5 mg along with start cymbalta 20 mg daily, after a week take cymbalta 20 mg daily to help with generalized body pain.  ESR and CRP ordered to assess for any inflammatory disorders         Relevant Orders    SED RATE    C REACTIVE PROTEIN (CRP)     Other Visit Diagnoses       Hospital discharge follow-up    -  Primary             There are no Patient Instructions on file for this visit.     Return in about 4 weeks (around 08/16/2022) for Followup for 40 mins.    Total duration of visit 30 minutes on the day of the visit, including chart review, review of vitals and nursing note, obtaining history from the patient, performing a medically appropriate examination and evaluation, counseling with patient, placing orders, coordination of care, and documentation.  Counseled patient regarding evaluation and management of diagnosis, risks and benefits of therapy.

## 2022-07-19 NOTE — Assessment & Plan Note
Follows with cardiology  Currently on aspirin, Crestor, Toprol-XL and Imdur  Lipoprotein a elevated, states cardiology plans to start [place] in the future

## 2022-07-19 NOTE — Telephone Encounter
Spoke to pt and reviewed cath cancel and scheduled OV with [facility]. Pt states she had a question for Dr. Cephus Slater to review, so she will send in portal.

## 2022-07-19 NOTE — Telephone Encounter
Pt called and LM asking if she could see Dr. Starleen Blue for cardio onc. Discussed with Margaretha Sheffield and Arrie Aran, they will send an offer with scheduling today.

## 2022-07-19 NOTE — Assessment & Plan Note
In remission  Lexapro 15 mg daily switch to duloxetine 20 mg daily to help with generalized pain

## 2022-07-20 ENCOUNTER — Encounter: Admit: 2022-07-20 | Discharge: 2022-07-20 | Payer: MEDICARE

## 2022-07-20 DIAGNOSIS — F3342 Major depressive disorder, recurrent, in full remission: Secondary | ICD-10-CM

## 2022-07-21 ENCOUNTER — Encounter: Admit: 2022-07-21 | Discharge: 2022-07-21 | Payer: MEDICARE

## 2022-07-21 ENCOUNTER — Ambulatory Visit: Admit: 2022-07-21 | Discharge: 2022-07-21 | Payer: MEDICARE

## 2022-07-21 DIAGNOSIS — T50905A Adverse effect of unspecified drugs, medicaments and biological substances, initial encounter: Secondary | ICD-10-CM

## 2022-07-21 DIAGNOSIS — R21 Rash and other nonspecific skin eruption: Secondary | ICD-10-CM

## 2022-07-21 MED ORDER — PREDNISONE 20 MG PO TAB
40 mg | ORAL_TABLET | Freq: Every day | ORAL | 0 refills | Status: AC
Start: 2022-07-21 — End: ?

## 2022-07-21 MED ORDER — DIPHENHYDRAMINE HCL 50 MG/ML IJ SOLN
50 mg | Freq: Once | INTRAMUSCULAR | 0 refills | Status: CP
Start: 2022-07-21 — End: ?
  Administered 2022-07-22: 01:00:00 50 mg via INTRAMUSCULAR

## 2022-07-22 NOTE — Patient Instructions
You were given an injection of Benadryl in clinic today, and you can repeat Benadryl dosing at home every 6 hours as needed for itching or allergic reaction symptoms.  Please start the prednisone 40 mg once daily tonight, and take with food.  Tomorrow, and each subsequent day, I would recommend to take the dose earlier in the day.  If your rash resolves and does not return after the prednisone, that you most likely are allergic to Imdur, or less likely the contrast dye that you were given in the hospital.  If your rash immediately returns after stopping the prednisone, then you may be allergic to the Toprol-XL.  If this occurs, please contact your cardiologist to discuss stopping this medication and replacing with a different medicine.  If your symptoms are worsening instead of improving with the current treatment plan, please seek ER evaluation.

## 2022-07-22 NOTE — Progress Notes
Cindy Randolph is a 62 y.o. female.    Chief Complaint:  Chief Complaint   Patient presents with    Rash     Pt states x4 days itchy rash spreading from pelvic area to arms, neck, and back. Starting new medications from last visit. Throat is also itchy, SOB, chest discomfort and pressure       History of Present Illness:  HPI  She is here with concern of an itchy rash and most recently some throat itchiness and swelling feeling, chest discomfort and mild shortness of breath.  She was hospitalized over the weekend, discharged on Sunday, and while she was in the hospital she had a heart catheterization and was also started on both Imdur and Toprol-XL.  Her lisinopril was stopped.  She states she started to notice some itching and a mild rash while in the hospital.  She saw her primary care doctor on Monday, and the rash was still mild, she was recommended to closely monitor the symptoms.  In the last 24 hours the rash is spread to different areas of her body and she has developed the throat swelling and an itchy throat feeling.  She states the chest discomfort and shortness of breath has also been present and she contacted her cardiologist who recommended she stop the Imdur, but she did take 1 dose this morning.  Review of Systems:  Review of Systems   Constitutional:  Negative for fever.   HENT:  Negative for trouble swallowing and voice change.    Respiratory:  Negative for wheezing.    Cardiovascular:  Negative for leg swelling.       Allergies:  Allergies   Allergen Reactions    Mango ANAPHYLAXIS    Oxycodone NAUSEA ONLY     Prefers tramadol    Sudafed [Pseudoephedrine Hcl] PALPITATIONS       Past Medical History:  Medical History:   Diagnosis Date    Accidental fall 02/23/22    Knee gave out and fell down stairs    Acquired hypothyroidism     Anxiety 2016    from pain    Back pain     Breast cancer in female [facility]) 12/2020    Left    Cancer of thyroid (HCC) 1980    Degenerative disc disease, cervical 1994 Degenerative disc disease, lumbar 2010    Degenerative disc disease, thoracic 2010    Depression situational, transient    after divorce, after death of 2nd husband    Essential hypertension began in 3rd trimester most pregnancies    remained after last pregnancy    Essential hypertension, benign as above    Family history of coronary artery disease in brother 06/17/2022    04/01/2022- Per OV Dr. Maisie Fus     Generalized headaches 1996-2010    probably hormonally related    GERD (gastroesophageal reflux disease)     H/O total thyroidectomy 05/24/1978    Heart murmur at birth    Dr. Maisie Fus said he didn't hear it a few years ago    High cholesterol     History of bilateral mastectomy 06/22/2022    History of external beam radiation therapy 07/15/2021    History of thyroid cancer 06/22/2022    TSH 0.39 and pt not taking med regularly until about 2 weeks ago.  Will have her repeat TSH in 3 weeks and f/u after.  Needs to be between 0.1 and 0.3.  Will refer her back to Endo for f/u.  Incontinence 3/23    With coughing and sneezing and strong sudden mivemwnts    Infection     Joint pain 1994    Limb alert care status     No access on R-arm    Osteoporosis 03/2021    Bilat hips    Other malignant neoplasm without specification of site thyroid, 1980    thyroidectomy    Spinal stenosis 2017 to present    per MRIs    Thyroid disorder as noted    Torn meniscus 12/25/2020    Ulcer 1987    with divorce; resolved    Unspecified deficiency anemia 2009    from excessive bleeding post-partum; treated iron       Past Surgical History:  Surgical History:   Procedure Laterality Date    HX WRIST FRACTURE SURGERY  1993    Baker's thumb with fixation    ARTHROPLASTY  2001    L ACL    COLONOSCOPY  2017    normal    ARTHROSCOPY KNEE WITH PARTIAL LATERAL MENISCECTOMY AND LEFT KNEE INJECTION. Right 01/08/2021    Performed by Tanja Port, MD at [facility]    BILATERAL TOTAL MASTECTOMIES Bilateral 01/22/2021    Performed by Massie Kluver, MD at [facility]    INTRAOPERATIVE SENTINEL LYMPH NODE IDENTIFICATION WITH/ WITHOUT NON-RADIOACTIVE DYE INJECTION Left 01/22/2021    Performed by Massie Kluver, MD at [facility]    INJECTION RADIOACTIVE TRACER FOR SENTINEL NODE IDENTIFICATION Left 01/22/2021    Performed by Massie Kluver, MD at [facility]    LEFT AXILLARY SENTINEL LYMPH NODE BIOPSY Left 01/22/2021    Performed by Massie Kluver, MD at [facility]    BILATERAL CHEST FLAT CLOSURE Bilateral 01/22/2021    Performed by Stevenson Clinch, MD at [facility]    BILATERAL CHEST FLAT CLOSURE x 8 Bilateral 01/22/2021    Performed by Stevenson Clinch, MD at [facility]    EXCISION BENIGN LESION 0.5 CM OR LESS - TORSO Right 01/22/2021    Performed by Stevenson Clinch, MD at [facility]    TUNNELED VENOUS PORT PLACEMENT Right 02/19/2021    Placement of port-a-cath - 31F Right 02/19/2021    Performed by Freund, Alecia Lemming., MD at [facility]    FLUOROSCOPIC GUIDANCE CENTRAL VENOUS ACCESS DEVICE PLACEMENT/ REPLACEMENT/ REMOVAL N/A 02/19/2021    Performed by Carloyn Manner., MD at [facility] OR    Left Completion Axillary Lymph Node Dissection Left 05/14/2021    Performed by Massie Kluver, MD at [facility]    REMOVAL TUNNELED CENTRAL VENOUS ACCESS DEVICE INCLUDING PORT/ PUMP Right 05/14/2021    Performed by Massie Kluver, MD at [facility]    COLONOSCOPY DIAGNOSTIC WITH SPECIMEN COLLECTION BY BRUSHING/ WASHING - FLEXIBLE N/A 10/14/2021    Performed by Benetta Spar, MD at [facility]    ESOPHAGOGASTRODUODENOSCOPY WITH SPECIMEN COLLECTION BY BRUSHING/ WASHING N/A 06/17/2022    Performed by Remigio Eisenmenger, MD at [facility]    ESOPHAGOGASTRODUODENOSCOPY WITH DILATION ESOPHAGUS WITH BALLOON 30 MM OR GREATER - FLEXIBLE N/A 06/17/2022    Performed by Remigio Eisenmenger, MD at [facility] ENDO    ANGIOGRAPHY CORONARY ARTERY WITH LEFT HEART CATHETERIZATION N/A 07/16/2022    Performed by Harley Alto, MD at [facility] CATH LAB    PERCUTANEOUS CORONARY STENT PLACEMENT WITH ANGIOPLASTY N/A 07/16/2022    Performed by Harley Alto, MD at [facility]  CATH LAB    ACL RECONSTRUCTION  06/03/1999    ECHOCARDIOGRAM PROCEDURE      FRACTURE SURGERY  1993    ORIF L 1st metacarpal    HX CARPAL TUNNEL RELEASE  1996    With second and other oregbnancies    KNEE SURGERY  L ACL as above    THYROIDECTOMY         Pertinent medical/surgical history reviewed    History obtained from patient.    Social History:  Social History     Tobacco Use    Smoking status: Never     Passive exposure: Past    Smokeless tobacco: Never    Tobacco comments:     Significant second hand smoke from parents 70-30 years old   Vaping Use    Vaping status: Never Used   Substance Use Topics    Alcohol use: Not Currently     Comment: Prior to 2020, socially, one drink once a week at most    Drug use: Never     Comment: in late teens, early twenties, a few times and not since     Social History     Substance and Sexual Activity   Drug Use Never    Comment: in late teens, early twenties, a few times and not since             Family History:  Family History   Problem Relation Age of Onset    Arthritis Paternal Grandmother         multiple heberdens nodules and deformities    Back pain Paternal Grandmother     Arthritis-osteo Paternal Grandmother     Diabetes Paternal Grandmother         Type 2 as older adult    Arthritis Mother         wear and tear    Back pain Mother     Hypertension Mother     Joint Pain Mother     Neck Pain Mother     Cancer-Breast Mother 75        was on HRT for 10-15 years    Cancer Mother         Breast CA, post menopausal, estrogen-sensitive    Miscarriages Mother         3-5 and a stillbirth    Basal Cell Carcinoma Brother     Back pain Brother     Hypertension Brother     Asthma Brother         Worst when a child    Basal Cell Carcinoma Brother     Back pain Brother         has had surgery    Hypertension Brother     Basal Cell Carcinoma Brother     Back pain Brother     Hypertension Brother     Diabetes Father         Type 2    Heart problem Father CABG 3    Heart Disease Father     Hypertension Father     Alcohol abuse Father     Diabetes Maternal Grandfather         Type 2 as older adult    Birth Defect Daughter         PRS, congenital diaphragmatic hernia, malrotation, grey matter heterotopia    Stroke Maternal Uncle         In his 66s    Thyroid Disease Maternal Uncle  Diabetes Paternal Grandfather     Birth Defect Nephew         craniosynostosis    Thyroid Disease Maternal Uncle     Cancer-Breast Maternal Great-Aunt 70          acetaminophen (TYLENOL) 325 mg tablet Take two tablets by mouth every 6 hours. Take scheduled for 3 days after surgery, then as needed. Do not exceed 4,000mg  in a 24 hour period.    aspirin 81 mg chewable tablet Chew one tablet by mouth daily.    biotin 1 mg cap Take one capsule by mouth daily.    CALCIUM PO Take 600 mg by mouth daily.    CHOLEcalciferoL (vitamin D3) 1,000 units tablet Take one tablet by mouth daily.    clobetasoL (TEMOVATE) 0.05 % topical ointment Apply  topically to affected area twice daily. Apply to areas of rash on hands twice daily; do NOT use on face/groin/underarms, use up to 2 weeks per month    diclofenac sodium (VOLTAREN) 1 % topical gel Apply four g topically to affected area daily as needed.    diclofenac sodium DR (VOLTAREN) 50 mg tablet Take one tablet by mouth twice daily. Take with food.    duloxetine DR (CYMBALTA) 20 mg capsule Take one capsule by mouth daily.    escitalopram oxalate (LEXAPRO) 10 mg tablet Take one tablet by mouth daily for 7 days, THEN one-half tablet daily for 7 days.    exemestane (AROMASIN) 25 mg tablet Take one tablet by mouth daily. Take after a meal.    Fish,Bora,Flax Oils-OM3,6,9 #1 (TRIPLE OMEGA) 400-400-400 mg capsule Take one capsule by mouth daily.    glucosam/chond-msm1/C/mang/bor (GLUCOSAMINE-CHOND-MSM COMPLEX PO) Take 1 capsule by mouth daily.    isosorbide mononitrate ER (IMDUR) 30 mg tablet,extended release 24 hr     melatonin 5 mg tablet Take one tablet by mouth at bedtime as needed.    metoprolol succinate XL (TOPROL XL) 25 mg extended release tablet Take one tablet by mouth daily.    omeprazole DR (PRILOSEC) 40 mg capsule Take one capsule by mouth daily.    other medication K Complete K1 & K2 as MK-4 & MK-7 : Take 1 softgel by mouth once daily    predniSONE (DELTASONE) 20 mg tablet Take two tablets by mouth daily with breakfast for 5 days.    rosuvastatin (CRESTOR) 20 mg tablet Take one tablet by mouth daily.    Selenium 100 mcg tab Take one tablet by mouth daily.    SYNTHROID 112 mcg tablet Take one tablet by mouth daily 30 minutes before breakfast.    traMADoL 25 mg tablet Take one tablet by mouth every 12 hours as needed.    vitamins, B complex tab Take one tablet by mouth daily.    vitamins, multiple cap Take one capsule by mouth daily.    zinc sulfate 220 mg (50 mg elemental zinc) capsule Take one capsule by mouth daily.       Vitals:  Vitals:    07/21/22 1805   BP: 133/86   BP Source: Arm, Right Upper   Pulse: 75   Temp: 36.9 ?C (98.4 ?F)   Resp: 19   SpO2: 98%   TempSrc: Oral   Weight: 71.8 kg (158 lb 3.2 oz)   Height: 157.5 cm (5' 2)        Physical Exam:  Physical Exam  Vitals and nursing note reviewed.   Constitutional:       General: She is not in acute  distress.     Appearance: Normal appearance.   HENT:      Head: Normocephalic and atraumatic.      Nose: Nose normal.   Eyes:      General:         Right eye: No discharge.         Left eye: No discharge.      Extraocular Movements: Extraocular movements intact.      Conjunctiva/sclera: Conjunctivae normal.   Cardiovascular:      Rate and Rhythm: Normal rate and regular rhythm.      Heart sounds: Normal heart sounds. No murmur heard.  Pulmonary:      Effort: Pulmonary effort is normal. No respiratory distress.      Breath sounds: Normal breath sounds. No wheezing, rhonchi or rales.   Musculoskeletal:      Comments: Ambulating without difficulty   Skin:     Findings: Rash (Scattered, macular papular rash, some pinpoint lesions, on torso, arms and groin area.  The highest concentration of rashes to her low back and groin area.  This rash is consistent with a medication reaction rash.) present.          Neurological:      Mental Status: She is alert. Mental status is at baseline.   Psychiatric:         Mood and Affect: Mood normal.         Behavior: Behavior normal.         Laboratory Results:         Radiology Interpretation:    EKG:    Facility Administered Meds:               Clinical Impression:  Mellody Florentine is a 62 y.o. female   1. Rash  This rash initially had somewhat of an appearance of a yeastlike rash, however when visualized on the back and in the low back area had more of an appearance of a medication reaction.  When you.  That with the symptoms that she has been experiencing of throat swelling andshortness of breath, this also makes a medication reaction more likely.  She was given Benadryl 50 mg IM in clinic today, and will be started on a prednisone burst of 40 mg daily for the next 5 days.  We discussed that if her symptoms resolve and do not return, then likely she is allergic to the Imdur.  However, there may be a remote possibility she was allergic to contrast dye, so if this recurs, she needs to notify whomever is giving her contrast dye in the future and they can predose her with Benadryl.  She verbalized understanding of this plan.  If her symptoms do not resolve, then it may be the Toprol-XL she is allergic to.  I am less inclined to stop this medication today as the risk of rebound hypertension and tachycardia is too great.  She verbalized understanding of this plan.  She also understands that if her symptoms are significantly worsening that she needs to seek ER evaluation.    2. Adverse effect of drug, initial encounter             1. Rash        2. Adverse effect of drug, initial encounter            Medications:  Encounter Medications   Medications    isosorbide mononitrate ER (IMDUR) 30 mg tablet,extended release 24 hr    diphenhydrAMINE HCL (BENADRYL) injection  50 mg    predniSONE (DELTASONE) 20 mg tablet     Sig: Take two tablets by mouth daily with breakfast for 5 days.     Dispense:  10 tablet     Refill:  0         Procedure Notes:      Patient Instructions:    Patient Instructions   You were given an injection of Benadryl in clinic today, and you can repeat Benadryl dosing at home every 6 hours as needed for itching or allergic reaction symptoms.  Please start the prednisone 40 mg once daily tonight, and take with food.  Tomorrow, and each subsequent day, I would recommend to take the dose earlier in the day.  If your rash resolves and does not return after the prednisone, that you most likely are allergic to Imdur, or less likely the contrast dye that you were given in the hospital.  If your rash immediately returns after stopping the prednisone, then you may be allergic to the Toprol-XL.  If this occurs, please contact your cardiologist to discuss stopping this medication and replacing with a different medicine.  If your symptoms are worsening instead of improving with the current treatment plan, please seek ER evaluation.      Jenkins Rouge, MD

## 2022-07-23 ENCOUNTER — Encounter: Admit: 2022-07-23 | Discharge: 2022-07-23 | Payer: MEDICARE

## 2022-07-26 ENCOUNTER — Encounter: Admit: 2022-07-26 | Discharge: 2022-07-26 | Payer: MEDICARE

## 2022-07-26 ENCOUNTER — Ambulatory Visit: Admit: 2022-07-26 | Discharge: 2022-07-26 | Payer: MEDICARE

## 2022-07-26 DIAGNOSIS — F419 Anxiety disorder, unspecified: Secondary | ICD-10-CM

## 2022-07-26 DIAGNOSIS — IMO0002 Ulcer: Secondary | ICD-10-CM

## 2022-07-26 DIAGNOSIS — S83209A Unspecified tear of unspecified meniscus, current injury, unspecified knee, initial encounter: Secondary | ICD-10-CM

## 2022-07-26 DIAGNOSIS — E039 Hypothyroidism, unspecified: Secondary | ICD-10-CM

## 2022-07-26 DIAGNOSIS — E7800 Pure hypercholesterolemia, unspecified: Secondary | ICD-10-CM

## 2022-07-26 DIAGNOSIS — M549 Dorsalgia, unspecified: Secondary | ICD-10-CM

## 2022-07-26 DIAGNOSIS — R011 Cardiac murmur, unspecified: Secondary | ICD-10-CM

## 2022-07-26 DIAGNOSIS — R32 Unspecified urinary incontinence: Secondary | ICD-10-CM

## 2022-07-26 DIAGNOSIS — K219 Gastro-esophageal reflux disease without esophagitis: Secondary | ICD-10-CM

## 2022-07-26 DIAGNOSIS — E079 Disorder of thyroid, unspecified: Secondary | ICD-10-CM

## 2022-07-26 DIAGNOSIS — I25119 Atherosclerotic heart disease of native coronary artery with unspecified angina pectoris: Secondary | ICD-10-CM

## 2022-07-26 DIAGNOSIS — I10 Essential (primary) hypertension: Secondary | ICD-10-CM

## 2022-07-26 DIAGNOSIS — D539 Nutritional anemia, unspecified: Secondary | ICD-10-CM

## 2022-07-26 DIAGNOSIS — M4800 Spinal stenosis, site unspecified: Secondary | ICD-10-CM

## 2022-07-26 DIAGNOSIS — E890 Postprocedural hypothyroidism: Secondary | ICD-10-CM

## 2022-07-26 DIAGNOSIS — M810 Age-related osteoporosis without current pathological fracture: Secondary | ICD-10-CM

## 2022-07-26 DIAGNOSIS — W19XXXA Unspecified fall, initial encounter: Secondary | ICD-10-CM

## 2022-07-26 DIAGNOSIS — C801 Malignant (primary) neoplasm, unspecified: Secondary | ICD-10-CM

## 2022-07-26 DIAGNOSIS — C73 Malignant neoplasm of thyroid gland: Secondary | ICD-10-CM

## 2022-07-26 DIAGNOSIS — C50112 Malignant neoplasm of central portion of left female breast: Secondary | ICD-10-CM

## 2022-07-26 DIAGNOSIS — M5134 Other intervertebral disc degeneration, thoracic region: Secondary | ICD-10-CM

## 2022-07-26 DIAGNOSIS — Z923 Personal history of irradiation: Secondary | ICD-10-CM

## 2022-07-26 DIAGNOSIS — M2550 Pain in unspecified joint: Secondary | ICD-10-CM

## 2022-07-26 DIAGNOSIS — B999 Unspecified infectious disease: Secondary | ICD-10-CM

## 2022-07-26 DIAGNOSIS — M5136 Other intervertebral disc degeneration, lumbar region: Secondary | ICD-10-CM

## 2022-07-26 DIAGNOSIS — C50919 Malignant neoplasm of unspecified site of unspecified female breast: Secondary | ICD-10-CM

## 2022-07-26 DIAGNOSIS — Z85850 Personal history of malignant neoplasm of thyroid: Secondary | ICD-10-CM

## 2022-07-26 DIAGNOSIS — Z9013 Acquired absence of bilateral breasts and nipples: Secondary | ICD-10-CM

## 2022-07-26 DIAGNOSIS — Z8249 Family history of ischemic heart disease and other diseases of the circulatory system: Secondary | ICD-10-CM

## 2022-07-26 DIAGNOSIS — F32A Depression: Secondary | ICD-10-CM

## 2022-07-26 DIAGNOSIS — M5030 Other cervical disc degeneration, unspecified cervical region: Secondary | ICD-10-CM

## 2022-07-26 DIAGNOSIS — Z789 Other specified health status: Secondary | ICD-10-CM

## 2022-07-26 DIAGNOSIS — R519 Generalized headaches: Secondary | ICD-10-CM

## 2022-07-26 MED ORDER — METOPROLOL SUCCINATE 25 MG PO TB24
25 mg | Freq: Every evening | ORAL | 0 refills | 90.00000 days | Status: AC
Start: 2022-07-26 — End: ?

## 2022-07-26 MED ORDER — METOPROLOL SUCCINATE 50 MG PO TB24
50 mg | ORAL_TABLET | Freq: Every evening | ORAL | 3 refills | 90.00000 days | Status: DC
Start: 2022-07-26 — End: 2022-07-26

## 2022-07-26 MED ORDER — AMLODIPINE 2.5 MG PO TAB
2.5 mg | ORAL_TABLET | Freq: Every day | ORAL | 3 refills | Status: AC
Start: 2022-07-26 — End: ?

## 2022-07-26 NOTE — Patient Instructions
-   It was nice to see you today!    - Let's continue Toprol at 25 mg daily. Since your palpitation symptoms improved with lower Synthroid dose in the past and your TSH was 0.51, consider reaching out to Dr. Kalman Shan to get input on slightly lower Synthroid dose.     - Since you did not tolerate the Imdur, let's start amlodipine 2.5 mg daily. It is a calcium channel blocker, vasodilator and we use it as an anti-anginal agent.     - Continue the up-titration of rosuvastatin as Dr. Curly Shores suggested when you were in the hospital.     - Let's proceed with a stress echo to see if you get ECG changes with exercise and see if we can reproduce your symptoms.     - Please send [place] a MyChart message or call our nurse line at [phone] with any questions or concerns in the meantime.

## 2022-07-26 NOTE — Progress Notes
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 Recent hospitalization @ [place] 2/23-2/25 for chest pain/dyspnea/headache> cardiology was consulted and pt underwent a heart cath, had 8 beat run of VT on tele > advised to follow-up with cards outpatient for monitoring, consider repatha if lipoprotein A high, consider alternative stress test to PET/CT.     Physician Info:   Referring: (self  Primary Cardiologist: Dr. Laverda Sorenson  Medical Oncologist: Dr .Donneta Romberg  Radiation Oncologist: Dr. Haskel Khan   PCP:  Dr. Lewayne Bunting    Location of Films:  IN HOUSE    Cardiac Testing:      07/16/22 Cardiac Cath CORONARY ANATOMY AND FINDINGS:    Left main coronary artery:  The left main coronary artery arises from the left coronary sinus.  The left main distally bifurcates into the left anterior descending artery and left circumflex coronary artery.  The left main was calcified otherwise patent.  Left circumflex coronary artery:  The left circumflex coronary artery is a large dominant vessel, that gives rise to 1 large caliber obtuse marginal branch, a medium caliber left posterolateral branch and the large caliber left posterior descending artery, that reaches the apex of the left ventricle.  The left circumflex and its branches were patent without any angiographically significant disease.  Left anterior descending artery:  The left anterior descending artery is a medium to large caliber vessel, which bifurcates into 2 in the mid of the vessel in a fashion that is typical of dual LAD system and both branches reach the apex.  The mid LAD just below the origin of first diagonal branch and prior to bifurcation has a 50% discrete lesion. The larger of the 2 branches have a 30% mild diffuse disease with lumpy bumpy appearance and mildly ectatic and tortuous.  The 1st diagonal branch was patent without any angiographically significant disease.  Right coronary artery:  The right coronary artery arises from the right coronary sinus giving rise to acute marginal branches.  The RCA is nondominant.  The mid RCA has 2 tandem lesions of about 50%.  FINAL IMPRESSION:    Moderate stenosis of mid LAD, which is a dual LAD system with 2 unequal branches that reached the apex of the left ventricle.  The larger of the 2 branches was lumpy bumpy throughout the mid segment and may be mildly ectatic.  The left circumflex is large and dominant and was disease free.     RECOMMENDATIONS:    Routine post cardiac catheterization care.  The patient's coronary anatomy does not provide a reason for the patient's acute presentation with chest pain.  Mid LAD disease is diffuse and at most moderate and would ideally not be suitable for PCI as the initial treatment strategy.  Recommend medical management of coronary artery disease at this time as well as a trial of antianginals for perhaps microvascular disease causing her symptoms.      07/14/2022 Echocardiogram  EF: 55%   Small sized left ventricle.  Moderately increased LV wall thickness, consistent with concentric remodeling.  Isolated prominent basal septal hypertrophy.   Normal left ventricular systolic function with estimated ejection fraction of 55% by biplane Simpson's method. No regional  wall motion abnormalities.  Mild global longitudinal strain at -22%  Normal diastolic function with normal estimated left atrial pressure.  Normal sized right ventricle. Normal right ventricular systolic function.  Normal sized left atrium. Normal sized right atrium..   Mild mitral and aortic regurgitation  Moderately dilated ascending aorta at 4.2 cm.  No evidence of pulmonary hypertension with estimated PA systolic pressure of 24 mmHg  No pericardial effusion.   07/14/2022 NM PET/CT MYOCARDIAL IMAGING (PERFUSION STRESS AND REST; ABSOLUTE QUANT   MYOCARDIAL BLOOD FLOW)  IMPRESSION   Heart size upper limits of normal with qualitatively mild coronary artery atherosclerotic calcification demonstrating no reversible ischemia.     Mild hypokinesis of the inferior apical wall, with decreased wall thickening and decreased radiotracer activity on both stress and rest imaging, suggestive of small prior infarction. Additional area of matched mildly decreased activity in the mid septal wall, with septal wall demonstrating dyskinesia.     Mildly decreased ejection fraction on stress that 42%.   Normal quantitative myocardial blood flow.     1. Decreased activity throughout the near entire anterior wall on stress imaging with relatively maintained uptake on rest images, suggestive of ischemia. Additional matched defect in the inferior wall with significant motion, which is favored to be artifactual.     2. Normal ejection fraction, wall motion, and quantitative myocardial blood flow.    03/06/2022 CT Coronary Calcium Scan  IMPRESSION   Total calcium score is 136 which represents the 80th percentile rank for females age 63-60.   1.  A calcium score of 136 correlates with moderate calcification.   2.  There is minimal calcific atheromatous plaque in the visualized portions of the ascending and descending aorta.   3.  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    02/13/21 Echocardiogram EF: 55%   GLS: -17%   Left Ventricle: The left ventricular size is normal. Concentric remodeling. The left ventricular systolic function is normal. There are no segmental wall motion abnormalities. Normal left ventricular diastolic function. Normal left atrial pressure.  Right Ventricle: The right ventricular size is normal. The right ventricular systolic function is normal.  Left Atrium: Normal size. Right Atrium: Normal size.  No hemodynamically significant valvular abnormalities.  The aortic root is normal in size. Mildly dilated ascending aorta.  Estimated Peak Systolic PA Pressure 22 mmHg  Normal central venous pressure   -pt reports no prior outside CV care or testing   Latest Reference Range & Units 07/16/22 09:49 07/16/22 11:51 07/18/22 12:10   NT-Pro-BNP <125 pg/mL 50.0     hs Troponin I 0 Hour <12 ng/L 4     hs Troponin I 2 Hour <12 ng/L  4    hs Troponin I, Random <12 ng/L   2     Prior Treatment(s):    -01/22/2021 underwent bilateral mastectomy     -received adjuvant chemotherapy with dose dense AC from 02/23/2021 until 04/06/2021.  She was scheduled to start paclitaxel on 04/20/2021 but declined due to concern for neuropathy.       Treatment Plan Meds    DOXOrubicin (ADRIAMYCIN) IV   02/23/2021 60 mg/m2 = 100.8 mg    03/09/2021 60 mg/m2 = 100.8 mg    03/23/2021 60 mg/m2 = 100.8 mg    04/06/2021 60 mg/m2 = 100.8 mg    Total Dose: 240 mg/m2     Treatment Plan Meds    cycloPHOSphamide (CYTOXAN) IV   02/23/2021 600 mg/m2 = 1,000 mg    03/09/2021 600  mg/m2 = 1,000 mg    03/23/2021 600 mg/m2 = 1,000 mg    04/06/2021 600 mg/m2 = 1,000 mg      -05/14/21: underwent left axillary lymph node dissection with final pathology showing carcinoma in 6 of 14 lymph nodes with the largest metastatic focus measuring 1 cm.  This is for a total of 11 out of 19 lymph nodes positive for carcinoma.     -underwent adjuvant radiotherapy to her left breast to 4256 cGy in 16 fractions using 3D conformal radiation treatment planning    07/01/2021   Treatment Data    Course ID C1-Left CW    Plan ID Lt CW_FiF    Plan ID Lt Sclav_FiF    Prescription Dose (cGy) 4,256    Prescription Dose (cGy) 4,256    Prescribed Dose per Fraction (Gy) 2.66    Prescribed Dose per Fraction (Gy) 2.66    Fractions Treated to Date 16    Fractions Treated to Date 16    Total Fractions on Plan 16    Total Fractions on Plan 16    Treatment Elapsed Days 21    Reference Point ID Lt CW    Reference Point ID Lt Sclav    Dosage Given to Date 42.56    Dosage Given to Date 42.56      -Long term plan includes at least 5 years of endocrine therapy. Started letrozole 06/2021. She did not tolerate Letrozole (joint arthralgia). Changed to Anastrozole 01/11/22.     -Abemacicilb started 10/25/21. Did not tolerate 100 mg BID dose. Discontinued 12/07/21. Restarted at 50mg  BID 01/21/22. Drug Holiday started 04/06/22 due to side effected. Discontinued 05/03/22, patient requested due to intolerable side effects.        Patient Active Problem List    Diagnosis Date Noted    Generalized body aches 07/19/2022    Coronary artery disease involving native coronary artery of native heart with angina pectoris (HCC) 07/19/2022    Chronic low back pain with bilateral sciatica 06/22/2022    Post-surgical hypothyroidism 06/22/2022    Cervical radiculopathy 06/22/2022    Ascending aorta dilatation (HCC) 06/22/2022    Radiation-induced pulmonary fibrosis (HCC) 06/22/2022    Recurrent major depression in full remission (HCC) 06/22/2022    Migraine with aura and without status migrainosus, not intractable 06/22/2022    Chronic pain of multiple joints 06/22/2022    Elevated coronary artery calcium score 06/17/2022     04/01/2022- Per OV Dr. Maisie Fus   03/06/2022- CT Cardiac Calcium Score: Total calcium score is 136 which represents the 80th percentile rank for females age 67-60. A calcium score of 136 correlates with moderate calcification. There is minimal calcific atheromatous plaque in the visualized portions of the ascending and descending aorta. 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 Stable dilated ascending aorta measuring 4.1 cm, previously 4.1 cm.       Hiatal hernia with gastroesophageal reflux 06/17/2022     04/01/2022- Per OV Dr. Maisie Fus       Menopausal osteoporosis 07/13/2021    Excess skin of breast 01/15/2021    Malignant neoplasm of left breast in female, estrogen receptor positive  (HCC) 12/22/2020     DIAGNOSIS:  Left grade 1 ILC (ER  96%, PR 10%, HER2 0, Ki-67 3%) at 12:00 dx 11/2020 HISTORY:  Ms. Bickle is a female who presented to the [facility] on 12/24/2020 at age 62 for left breast cancer.  She reports noticing Left breast intermittent discomfort over the last 2  months.  She had Screening mammogram at [facility] on 11/25/2020 which identified a focal asymmetry in the left breast.  She returned for left diagnostic mammgoram and ultrasound on 12/04/2020 which showed an ill-defined hypoechoic area at 12:00 7 cm FTN measuring 1.7 cm.   She underwent Left ultrasound guided biopsy on 12/10/2020 which revealed grade 1 hormone positive, HER2 negative ILC with associated LCIS. She has no breast complaints.  She proceeded with Bilateral total mastectomy/Left SLNB with oncoplastic flat closure on 01/22/2021.  Final surgical pathology revealed multifocal grade 1 ILC that merge to measuring 7.4 cm with associated LCIS, clear margins and 5/5 lymph nodes. The right breast is benign.   Adjuvant chemotherapy and cALND were recommended.  She completed adjuvant chemotherapy of dd AC from 10/3 to 04/06/2021, taxol was recommended but she declined.  She returned for Left completion ALND on 05/14/2021.  Final surgical pathology revealed 6/14 lymph nodes positive. She finished radiation with Dr. Haskel Khan on 07/01/21.    BREAST IMAGING:  Mammogram:    - Screening mammogram 11/25/2020 ([facility]) revealed scattered fibroglandular tissue density.  Benign appearing microcalcification. Unchanged right upper central anterior breast low density circumscribed masses dating back to 2013.  Focal asymmetry located within the left posterior central breast, 12:00, 7.3 cm FTN most conspicuous on MLO.  Recommend spot compression.    - Left diagnostic mammogram 12/04/2020 ([facility]) revealed irregular density at 12:00 left breast persists.  Ultrasound recommended.    Ultrasound:    Left targeted ultrasound 12/04/2020 ([facility]) revealed an ill-defined area which was slightly hypoechoic at 12:00, 7 cm FTN.  This was indeterminate and biopsy was recommended.   MRI:    - Breast MRI 12/26/2020 ([place]) The breast tissue is scattered areas of fibroglandular tissue.   There is mild background parenchymal enhancement. Left breast: There are multiple faint discontiguous irregular enhancing masses and discontiguous intervening nonmass enhancement involving predominantly the upper and inner left breast from anterior to posterior depth measuring in aggregate 6.3 cm AP by 3.6 cm transverse by 6.4 cm craniocaudal. Several areas demonstrate persistent enhancement kinetics. The artifact from tissue marker clips (ribbon and heart) at the site of biopsy demonstrating malignancy are seen within the upper left breast at the 12:00 position, middle depth (image 80). A representative discontiguous irregular enhancing mass is seen within the inner left breast at the 9:00 position, middle depth as on image 111, which demonstrates a mammographic correlate on outside CC tomosynthesis slice 18. Anterior extent of abnormal enhancement extends to the base of the left nipple. Posterior extent of abnormal enhancement is approximately 2.1 cm anterior to the underlying pectoralis muscle. There are greater than 5 small round morphologically abnormal and asymmetric level 1 left axillary lymph nodes. No suspicious level 2 or level 3 lymph nodes are seen. No suspicious internal mammary lymph nodes are seen. Right breast: No suspicious mass or nonmass enhancement is seen within the right breast. No suspicious right axillary or internal mammary lymph nodes are identified. Incidental Findings: None     REPRODUCTIVE HEALTH:  Age at first Menarche:  11  Age at First Live Birth:  44  Age at Menopause:  57  Gravida:  8  Para: 8  Breastfeeding:  yes    PROCEDURE:  Bilateral Total mastectomy/Left SLNB oncoplastic flat closure 01/22/2021 ([facility])  2. Left ALND 05/14/2021 [facility])  PATHOLOGY: multifocal grade 1 ILC that merge to measuring 7.4 cm with associated LCIS, clear margins and 5/5 lymph nodes. The right breast is benign.   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  No family history of ovarian or prostate cancer.   MEDICAL ONCOLOGY:    Dr. Neil Crouch Adjuvant chemotherapy:  ddACx 4 completed 04/06/2021, Taxol recommended but declined; Letrozole/Verzenio  REFERRED BY:  Dr. Erskine Emery  Formatting of this note might be different from the original. Formatting of this note might be different from the original. DIAGNOSIS:  Left grade 1 ILC (ER 91-100, PR 11-20, HER 1+, Ki 67 2-5%) at 12:00 dx 11/2020 HISTORY:  Ms. Fetsch is a female who presented to the [facility] on 12/24/2020 at age 37 for left breast cancer.  She reports noticing Left breast intermittent discomfort over the last 2 months.  She had Screening mammogram at [facility] on 11/25/2020 which identified a focal asymmetry in the left breast.  She returned for left diagnostic mammgoram and ultrasound on 12/04/2020 which showed an ill-defined hypoechoic area at 12:00 7 cm FTN measuring 1.7 cm.   She underwent Left ultrasound guided biopsy on 12/10/2020 which revealed grade 1 hormone positive, HER2 negative ILC with associated LCIS. She has no breast complaints. BREAST IMAGING: Mammogram:   - Screening mammogram 11/25/2020 ([facility]) revealed scattered fibroglandular tissue density.  Benign appearing microcalcification. Unchanged right upper central anterior breast low density circumscribed masses dating back to 2013.  Focal asymmetry located within the left posterior central breast, 12:00, 7.3 cm FTN most conspicuous on MLO.  Recommend spot compression.   - Left diagnostic mammogram 12/04/2020 ([facility]) revealed irregular density at 12:00 left breast persists.  Ultrasound recommended.  Ultrasound:  Left targeted ultrasound 12/04/2020 ([facility]) revealed an ill-defined area which was slightly hypoechoic at 12:00, 7 cm FTN.  This was indeterminate and biopsy was recommended. MRI:   - Breast MRI 12/26/2020 ([place]) .... REPRODUCTIVE HEALTH: Age at first Menarche:  42 Age at First Live Birth:  89 Age at Menopause:  12 Gravida:  8 Para: 8 Breastfeeding:  yes PROCEDURE:  pending PERTINENT PMH:  Thyroid Cancer (1980- thyroidectomy), HTN FAMILY HISTORY:  Mother- Breast cancer (65).  No family history of ovarian or prostate cancer. PHYSICAL EXAM on PRESENTATION:   MEDICAL ONCOLOGY:    Dr. Neil Crouch REFERRED BY:  Dr. Erskine Emery      Hypermobility syndrome 05/24/2014    Degenerative disc disease, lumbar 02/21/2014    Primary hypertension 11/13/2007           Medical History:   Diagnosis Date    Accidental fall 02/23/22    Knee gave out and fell down stairs    Acquired hypothyroidism     Anxiety 2016    from pain    Back pain     Breast cancer in female [facility]) 12/2020    Left    Cancer of thyroid (HCC) 1980    Degenerative disc disease, cervical 1994    Degenerative disc disease, lumbar 2010    Degenerative disc disease, thoracic 2010    Depression situational, transient    after divorce, after death of 2nd husband    Essential hypertension began in 3rd trimester most pregnancies    remained after last pregnancy    Essential hypertension, benign as above    Family history of coronary artery disease in brother 06/17/2022    04/01/2022- Per OV Dr. Maisie Fus     Generalized headaches 1996-2010    probably hormonally related    GERD (gastroesophageal reflux disease)     H/O total thyroidectomy 05/24/1978    Heart murmur  at birth    Dr. Maisie Fus said he didn't hear it a few years ago    High cholesterol     History of bilateral mastectomy 06/22/2022    History of external beam radiation therapy 07/15/2021    History of thyroid cancer 06/22/2022    TSH 0.39 and pt not taking med regularly until about 2 weeks ago.  Will have her repeat TSH in 3 weeks and f/u after.  Needs to be between 0.1 and 0.3.  Will refer her back to Endo for f/u.    Incontinence 3/23    With coughing and sneezing and strong sudden mivemwnts    Infection     Joint pain 1994    Limb alert care status     No access on R-arm    Osteoporosis 03/2021    Bilat hips    Other malignant neoplasm without specification of site thyroid, 1980    thyroidectomy    Spinal stenosis 2017 to present    per MRIs    Thyroid disorder as noted    Torn meniscus 12/25/2020    Ulcer 1987    with divorce; resolved    Unspecified deficiency anemia 2009    from excessive bleeding post-partum; treated iron       Surgical History:   Procedure Laterality Date    HX WRIST FRACTURE SURGERY  1993    Baker's thumb with fixation    ARTHROPLASTY  2001    L ACL    COLONOSCOPY  2017    normal    ARTHROSCOPY KNEE WITH PARTIAL LATERAL MENISCECTOMY AND LEFT KNEE INJECTION. Right 01/08/2021    Performed by Tanja Port, MD at [facility] OR    BILATERAL TOTAL MASTECTOMIES Bilateral 01/22/2021    Performed by Massie Kluver, MD at [facility] OR    INTRAOPERATIVE SENTINEL LYMPH NODE IDENTIFICATION WITH/ WITHOUT NON-RADIOACTIVE DYE INJECTION Left 01/22/2021    Performed by Massie Kluver, MD at [facility] OR    INJECTION RADIOACTIVE TRACER FOR SENTINEL NODE IDENTIFICATION Left 01/22/2021    Performed by Massie Kluver, MD at [facility] OR    LEFT AXILLARY SENTINEL LYMPH NODE BIOPSY Left 01/22/2021    Performed by Massie Kluver, MD at [facility] OR    BILATERAL CHEST FLAT CLOSURE Bilateral 01/22/2021    Performed by Stevenson Clinch, MD at [facility] OR    BILATERAL CHEST FLAT CLOSURE x 8 Bilateral 01/22/2021    Performed by Stevenson Clinch, MD at [facility] OR    EXCISION BENIGN LESION 0.5 CM OR LESS - TORSO Right 01/22/2021    Performed by Stevenson Clinch, MD at [facility] OR    TUNNELED VENOUS PORT PLACEMENT Right 02/19/2021    Placement of port-a-cath - 34F Right 02/19/2021    Performed by Freund, Alecia Lemming., MD at [facility] OR    FLUOROSCOPIC GUIDANCE CENTRAL VENOUS ACCESS DEVICE PLACEMENT/ REPLACEMENT/ REMOVAL N/A 02/19/2021    Performed by Flo Shanks Alecia Lemming., MD at [facility] OR    Left Completion Axillary Lymph Node Dissection Left 05/14/2021    Performed by Massie Kluver, MD at [facility] OR    REMOVAL TUNNELED CENTRAL VENOUS ACCESS DEVICE INCLUDING PORT/ PUMP Right 05/14/2021    Performed by Massie Kluver, MD at [facility] OR    COLONOSCOPY DIAGNOSTIC WITH SPECIMEN COLLECTION BY BRUSHING/ WASHING - FLEXIBLE N/A 10/14/2021    Performed by Benetta Spar, MD at [facility] OR    ESOPHAGOGASTRODUODENOSCOPY  WITH SPECIMEN COLLECTION BY BRUSHING/ WASHING N/A 06/17/2022    Performed by Remigio Eisenmenger, MD at [facility] ENDO    ESOPHAGOGASTRODUODENOSCOPY WITH DILATION ESOPHAGUS WITH BALLOON 30 MM OR GREATER - FLEXIBLE N/A 06/17/2022    Performed by Remigio Eisenmenger, MD at [facility] ENDO    ANGIOGRAPHY CORONARY ARTERY WITH LEFT HEART CATHETERIZATION N/A 07/16/2022    Performed by Harley Alto, MD at [facility] CATH LAB    PERCUTANEOUS CORONARY STENT PLACEMENT WITH ANGIOPLASTY N/A 07/16/2022    Performed by Harley Alto, MD at [facility] CATH LAB    ACL RECONSTRUCTION  06/03/1999    ECHOCARDIOGRAM PROCEDURE      FRACTURE SURGERY  1993    ORIF L 1st metacarpal    HX CARPAL TUNNEL RELEASE  1996    With second and other oregbnancies    KNEE SURGERY  L ACL as above    THYROIDECTOMY       CV Risk Factors: family history, dyslipidemia, and hypertension  -non smoker, history of second hand smoke exposure in childhood ages 21 -30   -no history of drug or alcohol abuse   -pt reports she had to be treated for high blood pressure during pregnancy, after last pregnancy continued to need antihypertensive therapy  -Family history of CAD in Father and HTN in brothers   Latest Reference Range & Units 03/16/21 12:27 02/15/22 07:36 07/17/22 04:57   Cholesterol <200 MG/DL 161 096 (H) 045   Triglycerides <150 MG/DL 409 (H) 811 92   HDL >91 MG/DL 54 74 38 (L)   LDL <478 mg/dL 295 (H) 621 (H) 68   VLDL MG/DL 36 25 18   Non HDL Cholesterol MG/DL 308 657 71   Lipoprotein (a)    132 (H)       Appointment Info:  Future Appointments   Date Time Provider Department Center   07/26/2022  2:30 PM Bo Mcclintock, APRN-NP [id number] [facility] Exam   08/02/2022  9:00 AM NURSE CHAIR IN LAB LEVEL 3 CLNLAB1 None   08/02/2022 10:00 AM Sistrunk, Cordie Grice, APRN-NP CCC2 [place] Exam   08/02/2022 12:30 PM [facility] [employer] TREATMENT CCT [place] Treatme   08/05/2022  9:30 AM Felipa Emory, APRN-NP UKCCOPRADTH [place] [facility]   08/05/2022  1:30 PM Doree Albee, RD [id number] [place] Exam   08/10/2022  9:00 AM Mable Paris, MD CVMCLOP CVM Exam   08/19/2022 10:00 AM Lewayne Bunting, Avie Echevaria, MD MPGENMED IM   08/30/2022  2:00 PM Zada Finders, MD RVFDERM IM   09/20/2022 11:20 AM Lewayne Bunting, Avie Echevaria, MD MPGENMED IM   10/14/2022  8:00 AM Orpah Cobb, MD CVMCLOP CVM Exam   11/01/2022  2:00 PM Radadiya, Dhruvil, MBBS QVAGASTR IM   12/06/2022  2:30 PM Guy Begin, PA-C [id number] [place] Exam   12/06/2022  2:30 PM BIS IC1 - BIOIMPEDENCE SPECTROSCOPY [id number]  Exam   01/03/2023  1:00 PM Ezequiel Essex, MD QVAIMEND IM

## 2022-07-27 ENCOUNTER — Encounter: Admit: 2022-07-27 | Discharge: 2022-07-27 | Payer: MEDICARE

## 2022-07-28 ENCOUNTER — Encounter: Admit: 2022-07-28 | Discharge: 2022-07-28 | Payer: MEDICARE

## 2022-07-28 DIAGNOSIS — M549 Dorsalgia, unspecified: Secondary | ICD-10-CM

## 2022-07-28 DIAGNOSIS — Z923 Personal history of irradiation: Secondary | ICD-10-CM

## 2022-07-28 DIAGNOSIS — C50919 Malignant neoplasm of unspecified site of unspecified female breast: Secondary | ICD-10-CM

## 2022-07-28 DIAGNOSIS — E7800 Pure hypercholesterolemia, unspecified: Secondary | ICD-10-CM

## 2022-07-28 DIAGNOSIS — R519 Generalized headaches: Secondary | ICD-10-CM

## 2022-07-28 DIAGNOSIS — Z789 Other specified health status: Secondary | ICD-10-CM

## 2022-07-28 DIAGNOSIS — F32A Depression: Secondary | ICD-10-CM

## 2022-07-28 DIAGNOSIS — M2550 Pain in unspecified joint: Secondary | ICD-10-CM

## 2022-07-28 DIAGNOSIS — E079 Disorder of thyroid, unspecified: Secondary | ICD-10-CM

## 2022-07-28 DIAGNOSIS — IMO0002 Ulcer: Secondary | ICD-10-CM

## 2022-07-28 DIAGNOSIS — M4800 Spinal stenosis, site unspecified: Secondary | ICD-10-CM

## 2022-07-28 DIAGNOSIS — I10 Essential (primary) hypertension: Secondary | ICD-10-CM

## 2022-07-28 DIAGNOSIS — M810 Age-related osteoporosis without current pathological fracture: Secondary | ICD-10-CM

## 2022-07-28 DIAGNOSIS — C801 Malignant (primary) neoplasm, unspecified: Secondary | ICD-10-CM

## 2022-07-28 DIAGNOSIS — E890 Postprocedural hypothyroidism: Secondary | ICD-10-CM

## 2022-07-28 DIAGNOSIS — Z9013 Acquired absence of bilateral breasts and nipples: Secondary | ICD-10-CM

## 2022-07-28 DIAGNOSIS — S83209A Unspecified tear of unspecified meniscus, current injury, unspecified knee, initial encounter: Secondary | ICD-10-CM

## 2022-07-28 DIAGNOSIS — F419 Anxiety disorder, unspecified: Secondary | ICD-10-CM

## 2022-07-28 DIAGNOSIS — D539 Nutritional anemia, unspecified: Secondary | ICD-10-CM

## 2022-07-28 DIAGNOSIS — Z85850 Personal history of malignant neoplasm of thyroid: Secondary | ICD-10-CM

## 2022-07-28 DIAGNOSIS — W19XXXA Unspecified fall, initial encounter: Secondary | ICD-10-CM

## 2022-07-28 DIAGNOSIS — M5134 Other intervertebral disc degeneration, thoracic region: Secondary | ICD-10-CM

## 2022-07-28 DIAGNOSIS — K219 Gastro-esophageal reflux disease without esophagitis: Secondary | ICD-10-CM

## 2022-07-28 DIAGNOSIS — R32 Unspecified urinary incontinence: Secondary | ICD-10-CM

## 2022-07-28 DIAGNOSIS — R011 Cardiac murmur, unspecified: Secondary | ICD-10-CM

## 2022-07-28 DIAGNOSIS — M5030 Other cervical disc degeneration, unspecified cervical region: Secondary | ICD-10-CM

## 2022-07-28 DIAGNOSIS — M5136 Other intervertebral disc degeneration, lumbar region: Secondary | ICD-10-CM

## 2022-07-28 DIAGNOSIS — E039 Hypothyroidism, unspecified: Secondary | ICD-10-CM

## 2022-07-28 DIAGNOSIS — B999 Unspecified infectious disease: Secondary | ICD-10-CM

## 2022-07-28 DIAGNOSIS — Z8249 Family history of ischemic heart disease and other diseases of the circulatory system: Secondary | ICD-10-CM

## 2022-07-28 DIAGNOSIS — C73 Malignant neoplasm of thyroid gland: Secondary | ICD-10-CM

## 2022-07-29 ENCOUNTER — Encounter: Admit: 2022-07-29 | Discharge: 2022-07-29 | Payer: MEDICARE

## 2022-07-29 DIAGNOSIS — K449 Diaphragmatic hernia without obstruction or gangrene: Secondary | ICD-10-CM

## 2022-07-29 MED ORDER — OMEPRAZOLE 40 MG PO CPDR
40 mg | ORAL_CAPSULE | Freq: Every day | ORAL | 1 refills | Status: AC
Start: 2022-07-29 — End: ?

## 2022-08-01 ENCOUNTER — Encounter: Admit: 2022-08-01 | Discharge: 2022-08-01 | Payer: MEDICARE

## 2022-08-02 ENCOUNTER — Ambulatory Visit: Admit: 2022-08-02 | Discharge: 2022-08-02 | Payer: MEDICARE

## 2022-08-02 ENCOUNTER — Encounter: Admit: 2022-08-02 | Discharge: 2022-08-02 | Payer: MEDICARE

## 2022-08-02 DIAGNOSIS — R519 Generalized headaches: Secondary | ICD-10-CM

## 2022-08-02 DIAGNOSIS — I10 Essential (primary) hypertension: Secondary | ICD-10-CM

## 2022-08-02 DIAGNOSIS — M5030 Other cervical disc degeneration, unspecified cervical region: Secondary | ICD-10-CM

## 2022-08-02 DIAGNOSIS — B999 Unspecified infectious disease: Secondary | ICD-10-CM

## 2022-08-02 DIAGNOSIS — M5134 Other intervertebral disc degeneration, thoracic region: Secondary | ICD-10-CM

## 2022-08-02 DIAGNOSIS — M810 Age-related osteoporosis without current pathological fracture: Secondary | ICD-10-CM

## 2022-08-02 DIAGNOSIS — M4800 Spinal stenosis, site unspecified: Secondary | ICD-10-CM

## 2022-08-02 DIAGNOSIS — Z9013 Acquired absence of bilateral breasts and nipples: Secondary | ICD-10-CM

## 2022-08-02 DIAGNOSIS — C50919 Malignant neoplasm of unspecified site of unspecified female breast: Secondary | ICD-10-CM

## 2022-08-02 DIAGNOSIS — F32A Depression: Secondary | ICD-10-CM

## 2022-08-02 DIAGNOSIS — IMO0002 Ulcer: Secondary | ICD-10-CM

## 2022-08-02 DIAGNOSIS — C73 Malignant neoplasm of thyroid gland: Secondary | ICD-10-CM

## 2022-08-02 DIAGNOSIS — E039 Hypothyroidism, unspecified: Secondary | ICD-10-CM

## 2022-08-02 DIAGNOSIS — M5136 Other intervertebral disc degeneration, lumbar region: Secondary | ICD-10-CM

## 2022-08-02 DIAGNOSIS — R011 Cardiac murmur, unspecified: Secondary | ICD-10-CM

## 2022-08-02 DIAGNOSIS — Z923 Personal history of irradiation: Secondary | ICD-10-CM

## 2022-08-02 DIAGNOSIS — Z8249 Family history of ischemic heart disease and other diseases of the circulatory system: Secondary | ICD-10-CM

## 2022-08-02 DIAGNOSIS — E079 Disorder of thyroid, unspecified: Secondary | ICD-10-CM

## 2022-08-02 DIAGNOSIS — M2550 Pain in unspecified joint: Secondary | ICD-10-CM

## 2022-08-02 DIAGNOSIS — M545 Acute bilateral low back pain without sciatica: Secondary | ICD-10-CM

## 2022-08-02 DIAGNOSIS — F419 Anxiety disorder, unspecified: Secondary | ICD-10-CM

## 2022-08-02 DIAGNOSIS — E890 Postprocedural hypothyroidism: Secondary | ICD-10-CM

## 2022-08-02 DIAGNOSIS — Z789 Other specified health status: Secondary | ICD-10-CM

## 2022-08-02 DIAGNOSIS — M549 Dorsalgia, unspecified: Secondary | ICD-10-CM

## 2022-08-02 DIAGNOSIS — S83209A Unspecified tear of unspecified meniscus, current injury, unspecified knee, initial encounter: Secondary | ICD-10-CM

## 2022-08-02 DIAGNOSIS — Z85850 Personal history of malignant neoplasm of thyroid: Secondary | ICD-10-CM

## 2022-08-02 DIAGNOSIS — D539 Nutritional anemia, unspecified: Secondary | ICD-10-CM

## 2022-08-02 DIAGNOSIS — C801 Malignant (primary) neoplasm, unspecified: Secondary | ICD-10-CM

## 2022-08-02 DIAGNOSIS — W19XXXA Unspecified fall, initial encounter: Secondary | ICD-10-CM

## 2022-08-02 DIAGNOSIS — K219 Gastro-esophageal reflux disease without esophagitis: Secondary | ICD-10-CM

## 2022-08-02 DIAGNOSIS — E7800 Pure hypercholesterolemia, unspecified: Secondary | ICD-10-CM

## 2022-08-02 DIAGNOSIS — R32 Unspecified urinary incontinence: Secondary | ICD-10-CM

## 2022-08-02 MED ORDER — KETOROLAC 30 MG/ML (1 ML) IJ SOLN
30 mg | Freq: Once | INTRAMUSCULAR | 0 refills | Status: CP
Start: 2022-08-02 — End: ?
  Administered 2022-08-02: 15:00:00 30 mg via INTRAMUSCULAR

## 2022-08-02 MED ORDER — CYCLOBENZAPRINE 5 MG PO TAB
5 mg | ORAL_TABLET | Freq: Three times a day (TID) | ORAL | 0 refills | 30.00000 days | Status: AC
Start: 2022-08-02 — End: ?

## 2022-08-04 NOTE — Progress Notes
Radiation Oncology Follow Up Note  Date: 08/05/2022       Cindy Randolph is a 62 y.o. female.     There were no encounter diagnoses.  Staging:  Cancer Staging   Malignant neoplasm of left breast in female, estrogen receptor positive  (HCC)  Staging form: Breast, AJCC 8th Edition  - Clinical stage from 12/22/2020: Stage IA (cT1c, cN0, cM0, G1, ER+, PR+, HER2-) - Signed by Massie Kluver, MD on 12/22/2020  - Pathologic stage from 02/02/2021: Stage IIIA (pT3, pN3, cM0, G1, ER+, PR+, HER2-) - Signed by Massie Kluver, MD on 05/27/2021      History of Present Illness  Treating Physicians: Drs. Geralyn Corwin     Diagnosis:   62 y.o. female with cT1cN0 ILC of left breast (ER+ HER2- with Ki-67 of 2-5%) status post bilateral mastectomy and L SLNB. Pathology showed 7.4 cm grade 1 ILC, resected with ideal margins, negative LVSI, 5/5 sentinel lymph nodes involved (largest metastatic deposit measuring 1.8 cm with positive ECE). She completed adjuvant ddAC then had left ALND for which pathology revealed 6/14 positive lymph nodes (total of 11/19 positive lymph nodes). Stage mpT3N3a. She was referred for a discussion regarding adjuvant radiotherapy.      Treatment History:   Left CW + Nodes (3D): 06/10/21 - 07/01/21  266 cGy in 16 fractions. 4256 cGy in total.       07/01/2021   Course ID C1-Left CW   Plan ID Lt [id number]   Prescribed Dose per Fraction 2.66   Fractions Treated to Date 16   Total Fractions on Plan 16   Reference Point ID Lt Sclav   Session Dosage Given 2.66   First Treatment Date 06-10-2021 02:17PM   Last Treatment Date 07-01-2021 10:36AM   Treatment Elapsed Days 21      Trial: IIT Hypofractionated radiation therapy for patients with breast cancer receiving regional nodal irradiation.     Interruptions: No     RX during treatment: SSD, Magic Mouthwash, and Tramadol      Patient underwent adjuvant radiotherapy to her left breast to 4256 cGy in 16 fractions using 3D conformal radiation treatment planning. She tolerated treatment well and had no treatment interruptions. As expected, she had odynophagia, mild erythema of treatment area without desquamation. She was prescribed Magic Mouthwash, SSD cream and 2.5% hydrocortisone cream.    Subjective:          [name] returns today for routine follow-up. She is unaccompanied to today's visit. She notes she has had ups and downs with her health. She was recently hospitalized with chest pain. There was no clear etiology identified. She does have a hiatal hernia. She has some shortness of air, this can occur even with very minimal exertion. She is going to see cardio-oncology. She acknowledges being deconditioned; she was walking quite a bit but has had some setbacks including a broken toe and then the chest pain. She would like to be more active, though is generally active doing normal activities. She watches her grandchildren 2 days per week. She notes her left arm/shoulder mobility is somewhat restricted. She also has some discomfort of her left chest wall. She has been going to PT for her back and they have helped her some with this tightness. She did have a cervical spine steroid injection and found this very beneficial. She experiences occasional headaches, tylenol usually helps. Also has had some dizziness which she attributes to medication. Has had vision problems, feels related to medication as  well. Did see her eye doctor and got a new prescription for corrective lenses. She is on exemestane and feels she tolerates is better than anastrozole and letrozole.        Review of Systems   Constitutional:  Positive for fatigue. Negative for fever.   HENT:  Negative for sore throat and trouble swallowing.    Respiratory:  Positive for shortness of breath. Negative for cough.    Cardiovascular:  Positive for chest pain. Negative for leg swelling.   Musculoskeletal:  Positive for arthralgias, back pain, neck pain and neck stiffness. Negative for myalgias.   Skin:  Negative for color change.   Neurological:  Negative for light-headedness and headaches.   Hematological:  Negative for adenopathy.   Psychiatric/Behavioral:  The patient is nervous/anxious.    All other systems reviewed and are negative.      Objective:          acetaminophen (TYLENOL) 325 mg tablet Take two tablets by mouth every 6 hours. Take scheduled for 3 days after surgery, then as needed. Do not exceed 4,000mg  in a 24 hour period.    amLODIPine (NORVASC) 2.5 mg tablet Take one tablet by mouth daily.    aspirin 81 mg chewable tablet Chew one tablet by mouth daily.    biotin 1 mg cap Take one capsule by mouth daily.    CALCIUM PO Take 600 mg by mouth daily.    CHOLEcalciferoL (vitamin D3) 1,000 units tablet Take one tablet by mouth daily.    clobetasoL (TEMOVATE) 0.05 % topical ointment Apply  topically to affected area twice daily. Apply to areas of rash on hands twice daily; do NOT use on face/groin/underarms, use up to 2 weeks per month    cyclobenzaprine (FLEXERIL) 5 mg tablet Take one tablet by mouth three times daily.    diclofenac sodium (VOLTAREN) 1 % topical gel Apply four g topically to affected area daily as needed.    diclofenac sodium DR (VOLTAREN) 50 mg tablet Take one tablet by mouth twice daily. Take with food.    duloxetine DR (CYMBALTA) 20 mg capsule Take one capsule by mouth daily.    exemestane (AROMASIN) 25 mg tablet Take one tablet by mouth daily. Take after a meal.    Fish,Bora,Flax Oils-OM3,6,9 #1 (TRIPLE OMEGA) 400-400-400 mg capsule Take one capsule by mouth daily.    glucosam/chond-msm1/C/mang/bor (GLUCOSAMINE-CHOND-MSM COMPLEX PO) Take 1 capsule by mouth daily.    melatonin 5 mg tablet Take one tablet by mouth at bedtime as needed.    metoprolol succinate XL (TOPROL XL) 25 mg extended release tablet Take one tablet by mouth at bedtime daily.    omeprazole DR (PRILOSEC) 40 mg capsule Take one capsule by mouth daily.    other medication K Complete K1 & K2 as MK-4 & MK-7 : Take 1 softgel by mouth once daily    rosuvastatin (CRESTOR) 20 mg tablet Take one tablet by mouth daily.    Selenium 100 mcg tab Take one tablet by mouth daily.    SYNTHROID 112 mcg tablet Take one tablet by mouth daily 30 minutes before breakfast.    traMADoL 25 mg tablet Take one tablet by mouth every 12 hours as needed.    vitamins, B complex tab Take one tablet by mouth daily.    vitamins, multiple cap Take one capsule by mouth daily.    zinc sulfate 220 mg (50 mg elemental zinc) capsule Take one capsule by mouth daily.     Vitals:  08/05/22 0901   BP: 137/89   BP Source: Arm, Right Upper   Pulse: 81   Temp: 36.7 ?C (98 ?F)   Resp: 16   SpO2: 96%   TempSrc: Oral   PainSc: Four   Weight: 73.1 kg (161 lb 3.2 oz)     Body mass index is 29.48 kg/m?Marland Kitchen     Pain Score: Four  Pain Loc:  (Arthritis/Chronic)     Fatigue Scale: 6    KARNOFSKY PERFORMANCE SCORE:  80% Normal activity with effort; some symptoms of disease     Physical Exam  Vitals reviewed.   Constitutional:       General: She is not in acute distress.     Appearance: Normal appearance. She is well-developed. She is not ill-appearing.   HENT:      Head: Normocephalic and atraumatic.   Eyes:      General: Lids are normal.      Extraocular Movements: Extraocular movements intact.      Conjunctiva/sclera: Conjunctivae normal.   Cardiovascular:      Rate and Rhythm: Normal rate and regular rhythm.      Heart sounds: Normal heart sounds.   Pulmonary:      Effort: Pulmonary effort is normal.      Breath sounds: Normal breath sounds.   Chest:   Breasts:     Right: Absent.      Left: Absent.      Comments: S/p bilateral mastectomies with unremarkable chest wall.     Musculoskeletal:      Cervical back: Neck supple.   Lymphadenopathy:      Cervical: No cervical adenopathy.      Upper Body:      Right upper body: No supraclavicular or axillary adenopathy.      Left upper body: No supraclavicular or axillary adenopathy.   Skin:     General: Skin is warm and dry.      Coloration: Skin is not pale.   Neurological:      Mental Status: She is alert and oriented to person, place, and time. Mental status is at baseline.      Cranial Nerves: Cranial nerves 2-12 are intact.      Sensory: Sensation is intact.      Motor: Motor function is intact.   Psychiatric:         Attention and Perception: Attention normal.         Mood and Affect: Mood and affect normal.         Speech: Speech normal.         Behavior: Behavior normal.         Cognition and Memory: Cognition and memory normal.            Laboratory:    Comprehensive Metabolic Profile    Lab Results   Component Value Date/Time    NA 135 (L) 07/17/2022 04:57 AM    K 4.4 07/17/2022 04:57 AM    CL 102 07/17/2022 04:57 AM    CO2 22 07/17/2022 04:57 AM    GAP 11 07/17/2022 04:57 AM    BUN 16 07/17/2022 04:57 AM    CR 0.78 07/17/2022 04:57 AM    GLU 97 07/17/2022 04:57 AM    Lab Results   Component Value Date/Time    CA 9.0 07/17/2022 04:57 AM    PO4 3.4 07/16/2022 09:49 AM    ALBUMIN 4.7 07/16/2022 09:49 AM    TOTPROT 7.3 07/16/2022 09:49 AM  ALKPHOS 45 07/16/2022 09:49 AM    AST 20 07/16/2022 09:49 AM    ALT 18 07/16/2022 09:49 AM    TOTBILI 0.5 07/16/2022 09:49 AM        CBC w diff    Lab Results   Component Value Date/Time    WBC 5.7 07/17/2022 04:57 AM    RBC 3.94 (L) 07/17/2022 04:57 AM    HGB 12.4 07/17/2022 04:57 AM    HCT 37.0 07/17/2022 04:57 AM    MCV 94.0 07/17/2022 04:57 AM    MCH 31.6 07/17/2022 04:57 AM    MCHC 33.6 07/17/2022 04:57 AM    RDW 13.4 07/17/2022 04:57 AM    PLTCT 169 07/17/2022 04:57 AM    MPV 8.1 07/17/2022 04:57 AM    Lab Results   Component Value Date/Time    NEUT 63 07/17/2022 04:57 AM    ANC 3.62 07/17/2022 04:57 AM    LYMA 23 (L) 07/17/2022 04:57 AM    ALC 1.28 07/17/2022 04:57 AM    MONA 9 07/17/2022 04:57 AM    AMC 0.53 07/17/2022 04:57 AM    EOSA 4 07/17/2022 04:57 AM    AEC 0.22 07/17/2022 04:57 AM    BASA 1 07/17/2022 04:57 AM    ABC 0.03 07/17/2022 04:57 AM        Imaging:   LONG-TERM CARDIAC MONITOR  The patient was monitored for a total of 13 days and 11 hours on 06/28/22.    During that time, the minimum heart rate recorded was 55 bpm with no   significant bradycardic runs.  There were no significant pauses.  The   maximum heart rate recorded was 135 bpm with an average heart rate of 75   bpm.  There were rare ventricular ectopic beats seen.  Total PVC burden   comprised <1% of total beats.  There were rare supraventricular ectopic   beats present with 15 SVT events.  The longest SVT episode lasted 23 beats   (10.2 seconds) at an average rate of 137 bpm.  It was associated with   symptoms.      A diary did accompany this recording period.  Complaints of skipped   beats/irregular beats, cat laid on monitor, nausea, short of breath,   headache, chest pain/pressure, light headed, hot flash were associated   with sinus rhythm, rare PVC's or PVC's and artifact.  One complaint of   irregular beat was associated with transient SVT.  Patient triggered   events were associated with sinus rhythm, atrial/ventricular ectopy, and   one brief episode of SVT.  No other arrhythmias were seen.    Impression:  13 day monitor associated with sinus rhythm and brief   symptomatic SVT lasting up to 10 seconds in nature.       Path:  PATHOLOGY REPORT   Date Value Ref Range Status   03/24/2022   Final    [facility]  [email or web address]    Department of Pathology and Laboratory Medicine  [address], [place], [place] [address]  Surgical Pathology Office:  [phone]  Fax:  [phone]  SURGICAL PATHOLOGY REPORT    NAME: ANNALOU, LAMBERSON. SURG PATH #: [id number] MR #: [id number] SPECIMEN  CLASS: SI BILLING #: [id number] ALT ID #:  LOCATION: [id number] DATE OF  PROCEDURE: 03/24/2022 AGE:  61 SEX: F DATE RECEIVED: 03/24/2022 DOB:  May 25, 1960  TIME RECEIVED:  09:57 PHYSICIAN: Odie Sera, MD DATE OF  REPORT: 03/26/2022 COPY TO: Hennie Duos  SMITH, DO DATE OF PRINTING: 03/26/2022 ########################################################################  Final Diagnosis:    A. Lymph node, right axilla, biopsy:    Fragments of lymph node, negative for carcinoma. See comment.    Comment:  Immunohistochemical stain for pancytokeratin (performed on block A1) is  negative. Deeper levels were utilized in evaluation, supporting above  diagnosis.    Pursuant to the Quality Assurance Program at the [facility] Pathology Department, selected slides from this case have been  concurrently reviewed by the following pathologist: Dr. Milbert Coulter who agrees  with the final diagnosis.       Attestation:  By this signature, I attest that I have personally formulated the final  interpretation expressed in this report and that the above diagnosis is  based upon my examination of the slides and/or other material indicated in  this report.    +++Electronically Signed Out By Sonda Primes, MD on 03/26/2022+++  Roger Shelter, DO, Resident                 pq/03/25/2022             ########################################################################  Material Received:  A: right axilla    History:  62 year old patient with a history of metastatic left invasive lobular  carcinoma status-post mastectomy.         Gross Description:  A. Received in formalin labeled right axilla is a 1.4 x 0.8 x 0.2 cm  aggregate of cylindrical yellow-tan, bloodstained, fatty tissue fragments.  The specimen is entirely submitted in A1. The breast biopsy is removed  from the patient at 08:22 on 03/24/2022, placed in formalin at 08:27 on  03/24/2022, and not removed from formalin until 23:40 on 03/24/2022. (jm)    jm/03/24/2022           If immunohistochemical stains and/or in situ hybridization are cited in  this report, the performance characteristics were determined by the  Department of Pathology and Laboratory Medicine of the [facility]  [facility]) in compliance with CLIA'88  regulations. Some of these tests rely on the use of analyte specific  reagents and are subject to specific labeling requirements by the FDA.  The stains are performed on formalin-fixed, paraffin-embedded tissue,  unless otherwise stated. Known positive and negative control tissues  demonstrate appropriate staining. Results should be interpreted with  caution given the likelihood of false negativity on decalcified specimens.  This testing was developed by the Department of Pathology and Laboratory  Medicine of the [facility]. It has not been cleared or approved  by the FDA.  The FDA has determined that such clearance or approval is not  necessary.    Testing performed by the [facility] at [address], [place], [place] [address]. CLIA # [id number].          Assessment and Plan:  Maurisha Mongeau is a 62 y.o. female with cT1cN0 ILC of left breast (ER+ HER2- with Ki-67 of 2-5%) status post bilateral mastectomy and L SLNB. Pathology showed 7.4 cm grade 1 ILC, resected with ideal margins, negative LVSI, 5/5 sentinel lymph nodes involved (largest metastatic deposit measuring 1.8 cm with positive ECE). She completed adjuvant ddAC then had left ALND for which pathology revealed 6/14 positive lymph nodes (total of 11/19 positive lymph nodes). Stage mpT3N3a. She enrolled in the IIT Hypofractionation clinical trial and received adjuvant radiotherapy to her left breast to 4256 cGy in 16 fractions using 3D conformal radiation treatment planning during 06/10/21 -  07/01/21.      Marcell Barlow was seen today for routine follow-up after completion of radiation therapy.  At this time, she is clinically stable with no clinical evidence of disease recurrence or progression.  She has no subacute toxicities from having undergone adjuvant radiation, though does continue to experience chest wall pain and dyspnea even with minimal exertion.   - Our plan will be for the patient to return to our clinic in 6 months for continued follow-up.  She may contact us in the interim if there are any questions or concerns requiring earlier assessment.  -The patient will keep their appointments with their other managing providers including medical oncology, Dr. Donneta Romberg; she is on anastrozole. Also following with breast surgery with next appointment this summer.          Total time for today's visit was 30 minutes. Visit time spent on the following: preparing to see the patient, obtaining and/or reviewing separately obtained history/information, performing a physical examination and/or evaluation, counseling and educating the patient/family/caregiver, referring to and communication with other health care professionals, documenting clinical information in the electronic or other health record and care coordination.     Liston Alba AGNP-C  [facility] - Radiation Oncology  [address]  [place], [place] [address]    Collaborating Physician: Bosie Helper, MD    Parts of this note were created using voice recognition software.  Please excuse any grammatical or typographical errors.

## 2022-08-05 ENCOUNTER — Encounter: Admit: 2022-08-05 | Discharge: 2022-08-05 | Payer: MEDICARE

## 2022-08-05 ENCOUNTER — Ambulatory Visit: Admit: 2022-08-05 | Discharge: 2022-08-05 | Payer: MEDICARE

## 2022-08-05 DIAGNOSIS — Z85850 Personal history of malignant neoplasm of thyroid: Secondary | ICD-10-CM

## 2022-08-05 DIAGNOSIS — W19XXXA Unspecified fall, initial encounter: Secondary | ICD-10-CM

## 2022-08-05 DIAGNOSIS — R519 Generalized headaches: Secondary | ICD-10-CM

## 2022-08-05 DIAGNOSIS — M810 Age-related osteoporosis without current pathological fracture: Secondary | ICD-10-CM

## 2022-08-05 DIAGNOSIS — S83209A Unspecified tear of unspecified meniscus, current injury, unspecified knee, initial encounter: Secondary | ICD-10-CM

## 2022-08-05 DIAGNOSIS — B999 Unspecified infectious disease: Secondary | ICD-10-CM

## 2022-08-05 DIAGNOSIS — F419 Anxiety disorder, unspecified: Secondary | ICD-10-CM

## 2022-08-05 DIAGNOSIS — R011 Cardiac murmur, unspecified: Secondary | ICD-10-CM

## 2022-08-05 DIAGNOSIS — C73 Malignant neoplasm of thyroid gland: Secondary | ICD-10-CM

## 2022-08-05 DIAGNOSIS — Z9013 Acquired absence of bilateral breasts and nipples: Secondary | ICD-10-CM

## 2022-08-05 DIAGNOSIS — C50919 Malignant neoplasm of unspecified site of unspecified female breast: Secondary | ICD-10-CM

## 2022-08-05 DIAGNOSIS — R32 Unspecified urinary incontinence: Secondary | ICD-10-CM

## 2022-08-05 DIAGNOSIS — D539 Nutritional anemia, unspecified: Secondary | ICD-10-CM

## 2022-08-05 DIAGNOSIS — C801 Malignant (primary) neoplasm, unspecified: Secondary | ICD-10-CM

## 2022-08-05 DIAGNOSIS — M5030 Other cervical disc degeneration, unspecified cervical region: Secondary | ICD-10-CM

## 2022-08-05 DIAGNOSIS — M5136 Other intervertebral disc degeneration, lumbar region: Secondary | ICD-10-CM

## 2022-08-05 DIAGNOSIS — C50112 Malignant neoplasm of central portion of left female breast: Secondary | ICD-10-CM

## 2022-08-05 DIAGNOSIS — Z789 Other specified health status: Secondary | ICD-10-CM

## 2022-08-05 DIAGNOSIS — K219 Gastro-esophageal reflux disease without esophagitis: Secondary | ICD-10-CM

## 2022-08-05 DIAGNOSIS — E079 Disorder of thyroid, unspecified: Secondary | ICD-10-CM

## 2022-08-05 DIAGNOSIS — IMO0002 Ulcer: Secondary | ICD-10-CM

## 2022-08-05 DIAGNOSIS — F32A Depression: Secondary | ICD-10-CM

## 2022-08-05 DIAGNOSIS — M5134 Other intervertebral disc degeneration, thoracic region: Secondary | ICD-10-CM

## 2022-08-05 DIAGNOSIS — I10 Essential (primary) hypertension: Secondary | ICD-10-CM

## 2022-08-05 DIAGNOSIS — E039 Hypothyroidism, unspecified: Secondary | ICD-10-CM

## 2022-08-05 DIAGNOSIS — Z923 Personal history of irradiation: Secondary | ICD-10-CM

## 2022-08-05 DIAGNOSIS — Z8249 Family history of ischemic heart disease and other diseases of the circulatory system: Secondary | ICD-10-CM

## 2022-08-05 DIAGNOSIS — M549 Dorsalgia, unspecified: Secondary | ICD-10-CM

## 2022-08-05 DIAGNOSIS — Z09 Encounter for follow-up examination after completed treatment for conditions other than malignant neoplasm: Secondary | ICD-10-CM

## 2022-08-05 DIAGNOSIS — M4800 Spinal stenosis, site unspecified: Secondary | ICD-10-CM

## 2022-08-05 DIAGNOSIS — E890 Postprocedural hypothyroidism: Secondary | ICD-10-CM

## 2022-08-05 DIAGNOSIS — E7800 Pure hypercholesterolemia, unspecified: Secondary | ICD-10-CM

## 2022-08-05 DIAGNOSIS — M2550 Pain in unspecified joint: Secondary | ICD-10-CM

## 2022-08-05 NOTE — Patient Instructions
It was a pleasure seeing you in clinic today.    Jalan Fariss RN, CNC  Dr. Joshua T. Bunch/Ortho Spine Surgery  [facility]  [facility]  [address]  [place], [place] [address]  Phone: [phone]   Fax [phone]  Scheduling [phone]  [email or web address]    We appreciate the interest in your health.  The physician reviews both the radiology reports and the imaging independently and will contact you if there are any emergent findings.  If you would like, incidental and chronic changes seen in your images can be reviewed with Dr. Bunch at a follow up appointment.  At that appointment, the radiology report and terminology used in that report can also be explained. Please call our Schedule line if you would like to follow up.

## 2022-08-06 ENCOUNTER — Encounter: Admit: 2022-08-06 | Discharge: 2022-08-06 | Payer: MEDICARE

## 2022-08-06 ENCOUNTER — Ambulatory Visit: Admit: 2022-08-06 | Discharge: 2022-08-07 | Payer: MEDICARE

## 2022-08-06 DIAGNOSIS — C73 Malignant neoplasm of thyroid gland: Secondary | ICD-10-CM

## 2022-08-06 DIAGNOSIS — C801 Malignant (primary) neoplasm, unspecified: Secondary | ICD-10-CM

## 2022-08-06 DIAGNOSIS — S83209A Unspecified tear of unspecified meniscus, current injury, unspecified knee, initial encounter: Secondary | ICD-10-CM

## 2022-08-06 DIAGNOSIS — R32 Unspecified urinary incontinence: Secondary | ICD-10-CM

## 2022-08-06 DIAGNOSIS — R519 Generalized headaches: Secondary | ICD-10-CM

## 2022-08-06 DIAGNOSIS — M810 Age-related osteoporosis without current pathological fracture: Secondary | ICD-10-CM

## 2022-08-06 DIAGNOSIS — E039 Hypothyroidism, unspecified: Secondary | ICD-10-CM

## 2022-08-06 DIAGNOSIS — M5136 Other intervertebral disc degeneration, lumbar region: Secondary | ICD-10-CM

## 2022-08-06 DIAGNOSIS — M549 Dorsalgia, unspecified: Secondary | ICD-10-CM

## 2022-08-06 DIAGNOSIS — K219 Gastro-esophageal reflux disease without esophagitis: Secondary | ICD-10-CM

## 2022-08-06 DIAGNOSIS — Z8249 Family history of ischemic heart disease and other diseases of the circulatory system: Secondary | ICD-10-CM

## 2022-08-06 DIAGNOSIS — F419 Anxiety disorder, unspecified: Secondary | ICD-10-CM

## 2022-08-06 DIAGNOSIS — E890 Postprocedural hypothyroidism: Secondary | ICD-10-CM

## 2022-08-06 DIAGNOSIS — W19XXXA Unspecified fall, initial encounter: Secondary | ICD-10-CM

## 2022-08-06 DIAGNOSIS — M5030 Other cervical disc degeneration, unspecified cervical region: Secondary | ICD-10-CM

## 2022-08-06 DIAGNOSIS — IMO0002 Ulcer: Secondary | ICD-10-CM

## 2022-08-06 DIAGNOSIS — M2550 Pain in unspecified joint: Secondary | ICD-10-CM

## 2022-08-06 DIAGNOSIS — F32A Depression: Secondary | ICD-10-CM

## 2022-08-06 DIAGNOSIS — I10 Essential (primary) hypertension: Secondary | ICD-10-CM

## 2022-08-06 DIAGNOSIS — M47816 Spondylosis without myelopathy or radiculopathy, lumbar region: Secondary | ICD-10-CM

## 2022-08-06 DIAGNOSIS — Z9013 Acquired absence of bilateral breasts and nipples: Secondary | ICD-10-CM

## 2022-08-06 DIAGNOSIS — D539 Nutritional anemia, unspecified: Secondary | ICD-10-CM

## 2022-08-06 DIAGNOSIS — Z85850 Personal history of malignant neoplasm of thyroid: Secondary | ICD-10-CM

## 2022-08-06 DIAGNOSIS — E7800 Pure hypercholesterolemia, unspecified: Secondary | ICD-10-CM

## 2022-08-06 DIAGNOSIS — M5134 Other intervertebral disc degeneration, thoracic region: Secondary | ICD-10-CM

## 2022-08-06 DIAGNOSIS — Z923 Personal history of irradiation: Secondary | ICD-10-CM

## 2022-08-06 DIAGNOSIS — Z789 Other specified health status: Secondary | ICD-10-CM

## 2022-08-06 DIAGNOSIS — E079 Disorder of thyroid, unspecified: Secondary | ICD-10-CM

## 2022-08-06 DIAGNOSIS — M5126 Other intervertebral disc displacement, lumbar region: Secondary | ICD-10-CM

## 2022-08-06 DIAGNOSIS — B999 Unspecified infectious disease: Secondary | ICD-10-CM

## 2022-08-06 DIAGNOSIS — R011 Cardiac murmur, unspecified: Secondary | ICD-10-CM

## 2022-08-06 DIAGNOSIS — C50919 Malignant neoplasm of unspecified site of unspecified female breast: Secondary | ICD-10-CM

## 2022-08-06 DIAGNOSIS — M4800 Spinal stenosis, site unspecified: Secondary | ICD-10-CM

## 2022-08-06 NOTE — Progress Notes
[facility] CLINIC NOTE       SUBJECTIVE:   62 year old female presenting in follow-up from last visit on 06/21/2022. At that time she was diagnosed with cervical stenosis, cervical radiculopathy and an epidural injection was performed at C7-T1.  Patient states that she has had 90% improvement in symptoms in upper extremities since that time and is very happy with that.  Continues physical therapy.  Patient recently was taken off diclofenac by cardiologist.  She was switched to duloxetine and feels that she has not seen significant improvement yet.  She has been on it for less than 1 month.  She states that she recently picked up her grandchild and had significant right low back pain.  She had RFA and lumbar spine 5 years ago with good results.  She is nervous that she may need to undergo more treatments.  Patient has not done formalized physical therapy for low back.  She went to urgent care and was given tramadol and muscle relaxer.  She states that the pain is getting better and less tight but still present if she moves wrong.  This pain is steadily improving.  Lastly, she notes that she is having overall increased joint pain.  It is both axial and in joints of hand.  It is steadily getting worse.  Her primary care physician is going to send a referral for rheumatology evaluation.       Review of Systems    Current Outpatient Medications:     acetaminophen (TYLENOL) 325 mg tablet, Take two tablets by mouth every 6 hours. Take scheduled for 3 days after surgery, then as needed. Do not exceed 4,000mg  in a 24 hour period., Disp: 40 tablet, Rfl: 0    amLODIPine (NORVASC) 2.5 mg tablet, Take one tablet by mouth daily., Disp: 30 tablet, Rfl: 3    aspirin 81 mg chewable tablet, Chew one tablet by mouth daily., Disp: 90 tablet, Rfl: 0    biotin 1 mg cap, Take one capsule by mouth daily., Disp: , Rfl:     CALCIUM PO, Take 600 mg by mouth daily., Disp: , Rfl:     CHOLEcalciferoL (vitamin D3) 1,000 units tablet, Take one tablet by mouth daily., Disp: , Rfl:     clobetasoL (TEMOVATE) 0.05 % topical ointment, Apply  topically to affected area twice daily. Apply to areas of rash on hands twice daily; do NOT use on face/groin/underarms, use up to 2 weeks per month, Disp: 60 g, Rfl: 3    cyclobenzaprine (FLEXERIL) 5 mg tablet, Take one tablet by mouth three times daily., Disp: 45 tablet, Rfl: 0    diclofenac sodium (VOLTAREN) 1 % topical gel, Apply four g topically to affected area daily as needed., Disp: , Rfl:     diclofenac sodium DR (VOLTAREN) 50 mg tablet, Take one tablet by mouth twice daily. Take with food., Disp: , Rfl:     duloxetine DR (CYMBALTA) 20 mg capsule, Take one capsule by mouth daily., Disp: 60 capsule, Rfl: 0    exemestane (AROMASIN) 25 mg tablet, Take one tablet by mouth daily. Take after a meal., Disp: 30 tablet, Rfl: 11    Fish,Bora,Flax Oils-OM3,6,9 #1 (TRIPLE OMEGA) 400-400-400 mg capsule, Take one capsule by mouth daily., Disp: , Rfl:     glucosam/chond-msm1/C/mang/bor (GLUCOSAMINE-CHOND-MSM COMPLEX PO), Take 1 capsule by mouth daily., Disp: , Rfl:     melatonin 5 mg tablet, Take one tablet by mouth at bedtime as needed., Disp: , Rfl:     metoprolol  succinate XL (TOPROL XL) 25 mg extended release tablet, Take one tablet by mouth at bedtime daily., Disp: , Rfl:     omeprazole DR (PRILOSEC) 40 mg capsule, Take one capsule by mouth daily., Disp: 90 capsule, Rfl: 1    other medication, K Complete K1 & K2 as MK-4 & MK-7 : Take 1 softgel by mouth once daily, Disp: , Rfl:     rosuvastatin (CRESTOR) 20 mg tablet, Take one tablet by mouth daily., Disp: 90 tablet, Rfl: 0    Selenium 100 mcg tab, Take one tablet by mouth daily., Disp: , Rfl:     SYNTHROID 112 mcg tablet, Take one tablet by mouth daily 30 minutes before breakfast., Disp: 90 tablet, Rfl: 3    traMADoL 25 mg tablet, Take one tablet by mouth every 12 hours as needed., Disp: 30 tablet, Rfl: 0    vitamins, B complex tab, Take one tablet by mouth daily., Disp: , Rfl:     vitamins, multiple cap, Take one capsule by mouth daily., Disp: , Rfl:     zinc sulfate 220 mg (50 mg elemental zinc) capsule, Take one capsule by mouth daily., Disp: , Rfl:     Current Facility-Administered Medications:     milk of magnesium oral suspension 30 mL, 30 mL, Oral, Q6H PRN, Kovelamudi, Swathi, MD  Allergies   Allergen Reactions    Mango ANAPHYLAXIS    Other [Unclassified Drug] ANAPHYLAXIS     DUCK Meat and Eggs     Imdur [Isosorbide Mononitrate] CHEST TIGHTNESS    Iodinated Contrast Media RASH     She developed rash on back and neck after hospitalization 06/2022 where she underwent heart cath. Unsure if contrast was cause of rash, but it developed shortly after procedure.    Oxycodone NAUSEA ONLY     Prefers tramadol    Sudafed [Pseudoephedrine Hcl] PALPITATIONS     Physical Exam  Vitals:    08/06/22 1230   PainSc: Three   Weight: 73 kg (161 lb)   Height: 157.5 cm (5' 2.01)     Oswestry Total Score:: 50  Pain Score: Three  Body mass index is 29.44 kg/m?Marland Kitchen  Physical examination:   BP (!) 131/90 (BP Source: Arm, Left Upper, Patient Position: Sitting)  - Pulse 81  - Ht 157.5 cm (5' 2.01)  - Wt 73 kg (161 lb)  - SpO2 98%  - BMI 29.44 kg/m?   Pain Score: Three    Gen: Alert & Oriented X 3  HEENT: EOMI  Neck: Supple, no elevated JVP  Heart: Extremities well perfused  Lungs: non labored breathing  Abdomen: Soft, non-tender, non-distended  Skin: no gross lesions appreciated  Ext: purposeful movement of extremities     UPPER EXTREMITIES  MS:   Root Right Left   Shoulder Abduction C5 5 5   Elbow Flexion C5 5 5   Elbow Extension C7 5 5   Wrist Extension C6 5 5   Finger Flexion C8 5 5   Finger Abduction T1 5 5     Adequate cervical range of motion with flexion, extension, side bending and rotation.    Full AROM with bilateral shoulder forward flexion, abduction, internal and external rotation.      No tenderness to palpation through the cervical spinous processes, SCM, splenius capitis, scalenes, paraspinal musculature, trapezius, rhomboids.      Spurlings negative.     Neuro:  DTR's 1+ in bilateral biceps, triceps, brachioradialis   Upper Extremity Tone Normal   Upper  Extremity Sensation Intact to light touch bilaterally     LOWER EXTREMITIES  MS:   Root Right Left   Hip Flexion L2 5 5   Knee Flexion L5/S1 5 5   Knee Extension L3 5 5   Dorsiflexion L4 5 5   Plantarflexion S1 5 5   EHL Extension L5 5 5     Gait was smooth and symmetric with equal arm swing.        No pain reproduced with facet loading.    Tenderness palpation over right lower back musculature and left SI.  No tenderness to palpation along the spinous process, facet joints, gluteal musculature, greater trochanters.      Patient is able to forward flex to knees, and able to extend without significant pain.  Mild right lower back pain with extension.  Full ROM bilateral lower extremities.      Negative slump testing.  Negative straight leg testing to 30-75 degrees.      Negative FADIR testing.  Mild positive Patrick-FABER testing.  Positive Fortin Finger Sign.   Negative Gaenslens testing.    Neuro:  DTR's 2+ right patella, 2+ left patella  2+ right achilles, 2+ left achilles   Lower Extremity Tone Normal   Lower Extremity Sensation Intact to light touch bilaterally     DIAGNOSTICS:  Results for orders placed during the hospital encounter of 08/02/22    L SPINE AP & LAT    Narrative  Exam: L SPINE AP & LAT    CLINICAL INDICATION: 61 years pain    COMPARISON: 10/06/2021, MRI L-SPINE EXTERNAL IMAGING 02/10/2021, CT CHEST W CONTRAST 03/03/2020, L SPINE AP & LATERAL.    Impression  1.  Grade 1 anterolisthesis of L3-L4. Mild leftward offset of the L3 vertebral body. Severe disc disease and disc space narrowing at L3-4, otherwise mild lumbar spondylosis. Facet arthropathy.    2.  No acute fractures.    By my electronic signature, I attest that I have personally reviewed the images for this examination and formulated the interpretations and opinions expressed in this report      Finalized by Shanna Cisco, M.D. on 08/02/2022 11:44 AM. Dictated by Jae Dire. Ladona Ridgel, M.D. on 08/02/2022 10:57 AM.    No results found for this or any previous visit.    Results for orders placed during the hospital encounter of 03/02/22    T SPINE 3 VIEWS    Narrative  Exam: RIBS BILATERAL WO CHEST, T SPINE 3 VIEWS    CLINICAL INDICATION: 61 years Pleurodynia.    COMPARISON: None.    Impression  1.  No acute displaced rib fracture.    2.  No evidence of pleural effusion or pneumothorax.    3.  Mild-to-moderate multilevel thoracic spondylosis most pronounced at the mid thoracic spine. Sagittal alignment and vertebral body heights are grossly maintained.          Finalized by Claudina Lick, M.D. on 03/02/2022 3:51 PM. Dictated by Claudina Lick, M.D. on 03/02/2022 3:47 PM.      MRI C-Spine Results:  Results for orders placed during the hospital encounter of 06/04/22    MRI C-SPINE WO CONTRAST    Narrative  MRI cervical spine    HISTORY:    Cervical stenosis of spine. Neck pain with radicular symptoms into arms left greater than right, last MRI cervical spine in 2020. History of breast cancer.    TECHNIQUE:    Multiplanar, multisequence MRI images were obtained through the cervical spine without  contrast.    FINDINGS:    Comparison is made with plain films of the cervical spine from 05/06/2022 and a previous MRI of the cervical spine from 08/10/2018.    Images through the lower portion of the brain demonstrate a partially empty sellar configuration with a thin volume pituitary. There is also a punctate hypointensity on T1, T2, and STIR imaging within the left cerebellum (image 3 of series 3). Cerebellar tonsils are normal in position. The foramen magnum is widely patent.    The cervical vertebral body heights are maintained. There is smooth reversal of the cervical lordosis centered at the C4-5 level. There is trace degenerative retrolisthesis at C4-5, C5-6, and C6-7 where there is also marked loss of disc height.    There are multilevel degenerative endplate signal changes. There is no aggressive or destructive geographic marrow replacing lesion.    There is no paraspinal mass    The cervical cord and upper thoracic cord is normal in signal and caliber.    The level by level description is as follows:    C2-C3: No spinal or foraminal stenosis    C3-C4: No spinal or foraminal stenosis    C4-C5: Loss of disc height. Posterior disc osteophyte complex and uncovertebral joint enlargement. Moderate central spinal stenosis. Mild bilateral foraminal stenosis    C5-6: Posterior disc osteophyte complex and uncovertebral joint enlargement. Moderate to marked central spinal stenosis. Marked right and mild left foraminal stenosis    C6-7: Posterior disc osteophyte complex and uncovertebral joint enlargement. Moderate central spinal stenosis. Marked right and moderate left foraminal stenosis    C7-T1: Posterior disc osteophyte complex. Uncovertebral joint enlargement. Mild spinal stenosis. Marked right foraminal stenosis.    Impression  1. Chronic cervical spondylosis with marked multilevel disc degeneration greatest at C4-5, C5-6, and C6-7.  2. Multilevel central spinal stenosis, greatest at C5-6 where it is moderate to marked  3. Multilevel foraminal stenosis, greater on the right where there are multiple levels of marked narrowing  4. No abnormal cord signal  5. No evidence of cervical metastatic disease.  6. Punctate left cerebellar hypointensity on all pulse sequences. This is nonspecific and could represent calcification, a tiny area of prior hemorrhage, or a tiny cavernoma.      Finalized by Marily Memos, M.D. on 06/05/2022 11:25 AM. Dictated by Marily Memos, M.D. on 06/05/2022 11:09 AM.      Last Cr and LFT's:  Creatinine   Date Value Ref Range Status   07/17/2022 0.78 0.4 - 1.00 MG/DL Final     AST (SGOT)   Date Value Ref Range Status   07/16/2022 20 7 - 40 U/L Final     ALT (SGPT)   Date Value Ref Range Status 07/16/2022 18 7 - 56 U/L Final     Alk Phosphatase   Date Value Ref Range Status   07/16/2022 45 25 - 110 U/L Final     Total Bilirubin   Date Value Ref Range Status   07/16/2022 0.5 0.3 - 1.2 MG/DL Final            Assessment:  The pain complaints are most likely due to:  1. Lumbar radiculopathy    2. Lumbar facet arthropathy    3. Lumbar discogenic pain syndrome      Cindy Randolph is a 62 y.o. female who  has a past medical history of Accidental fall (02/23/22), Acquired hypothyroidism, Anxiety (2016), Back pain, Breast cancer in female [facility]) (12/2020), Cancer of thyroid (HCC) (1980),  Degenerative disc disease, cervical (1994), Degenerative disc disease, lumbar (2010), Degenerative disc disease, thoracic (2010), Depression (situational, transient), Essential hypertension (began in 3rd trimester most pregnancies), Essential hypertension, benign (as above), Family history of coronary artery disease in brother (06/17/2022), Generalized headaches (1996-2010), GERD (gastroesophageal reflux disease), H/O total thyroidectomy (05/24/1978), Heart murmur (at birth), High cholesterol, History of bilateral mastectomy (06/22/2022), History of external beam radiation therapy (07/15/2021), History of thyroid cancer (06/22/2022), Incontinence (3/23), Infection, Joint pain (1994), Limb alert care status, Osteoporosis (03/2021), Other malignant neoplasm without specification of site (thyroid, 1980), Spinal stenosis (2017 to present), Thyroid disorder (as noted), Torn meniscus (12/25/2020), Ulcer (1987), and Unspecified deficiency anemia (2009). who presents for evaluation of pain.    Plan:  1.  Lifestyle modification.  Continue current activities as tolerated.  Continue to avoid aggravating maneuvers.  Continue to practice good lifting techniques.  Heat, ice and topical treatments recommended for pain relief.  2.  Medication.    -Patient will continue duloxetine and monitor for effect.  -Discussed if there are any other options for possible pain management for generalized pain.  Will wait until evaluation by rheumatology to begin any other medication.  No new prescriptions provided at this appointment.  3.  Therapy.                 -Continue therapy for cervical spine and lumbar spine.  Focusing on core strength, pelvic stability and overall strengthening and stabilization.    4.  Interventions.  No interventions planned at this time.  5.  Diagnostics.  No new imaging to be ordered at this time.  If patient not having improvement in 3 to 4 weeks, patient will call and we will schedule lumbar MRI for that time.  6.  Follow-up.  Patient will follow-up as needed.  She will reach out in 4 weeks with an update on how she is doing as well as if any other major changes have occurred.  Patient will continue to follow-up with rheumatology and her primary care physician for follow-up and evaluation.    Risks/benefits of all pharmacologic and interventional treatments discussed and questions answered.     Please feel free to contact me with any questions or concerns.     Kandyce Rud, DO  PGY2 PMR resident  On Voalte    Voice recognition software was used for this dictation.  Every attempt was made to edit but small errors may persist    ATTESTATION    I personally performed the key portions of the E/M visit, discussed case with resident and concur with resident documentation of history, physical exam, assessment, and treatment plan unless otherwise noted.    Staff name:  Lizbeth Bark, MD Date:  08/06/2022

## 2022-08-07 DIAGNOSIS — M5416 Radiculopathy, lumbar region: Secondary | ICD-10-CM

## 2022-08-09 ENCOUNTER — Encounter: Admit: 2022-08-09 | Discharge: 2022-08-09 | Payer: MEDICARE

## 2022-08-10 ENCOUNTER — Encounter: Admit: 2022-08-10 | Discharge: 2022-08-10 | Payer: MEDICARE

## 2022-08-10 ENCOUNTER — Ambulatory Visit: Admit: 2022-08-10 | Discharge: 2022-08-10 | Payer: MEDICARE

## 2022-08-10 DIAGNOSIS — E7800 Pure hypercholesterolemia, unspecified: Secondary | ICD-10-CM

## 2022-08-10 DIAGNOSIS — M5134 Other intervertebral disc degeneration, thoracic region: Secondary | ICD-10-CM

## 2022-08-10 DIAGNOSIS — Z8249 Family history of ischemic heart disease and other diseases of the circulatory system: Secondary | ICD-10-CM

## 2022-08-10 DIAGNOSIS — R519 Generalized headaches: Secondary | ICD-10-CM

## 2022-08-10 DIAGNOSIS — Z923 Personal history of irradiation: Secondary | ICD-10-CM

## 2022-08-10 DIAGNOSIS — R32 Unspecified urinary incontinence: Secondary | ICD-10-CM

## 2022-08-10 DIAGNOSIS — M5136 Other intervertebral disc degeneration, lumbar region: Secondary | ICD-10-CM

## 2022-08-10 DIAGNOSIS — B999 Unspecified infectious disease: Secondary | ICD-10-CM

## 2022-08-10 DIAGNOSIS — Z9189 Other specified personal risk factors, not elsewhere classified: Secondary | ICD-10-CM

## 2022-08-10 DIAGNOSIS — M810 Age-related osteoporosis without current pathological fracture: Secondary | ICD-10-CM

## 2022-08-10 DIAGNOSIS — IMO0002 Ulcer: Secondary | ICD-10-CM

## 2022-08-10 DIAGNOSIS — F419 Anxiety disorder, unspecified: Secondary | ICD-10-CM

## 2022-08-10 DIAGNOSIS — M549 Dorsalgia, unspecified: Secondary | ICD-10-CM

## 2022-08-10 DIAGNOSIS — R011 Cardiac murmur, unspecified: Secondary | ICD-10-CM

## 2022-08-10 DIAGNOSIS — I77810 Thoracic aortic ectasia: Secondary | ICD-10-CM

## 2022-08-10 DIAGNOSIS — Z9013 Acquired absence of bilateral breasts and nipples: Secondary | ICD-10-CM

## 2022-08-10 DIAGNOSIS — I10 Essential (primary) hypertension: Secondary | ICD-10-CM

## 2022-08-10 DIAGNOSIS — C73 Malignant neoplasm of thyroid gland: Secondary | ICD-10-CM

## 2022-08-10 DIAGNOSIS — M2550 Pain in unspecified joint: Secondary | ICD-10-CM

## 2022-08-10 DIAGNOSIS — E079 Disorder of thyroid, unspecified: Secondary | ICD-10-CM

## 2022-08-10 DIAGNOSIS — Z789 Other specified health status: Secondary | ICD-10-CM

## 2022-08-10 DIAGNOSIS — M4800 Spinal stenosis, site unspecified: Secondary | ICD-10-CM

## 2022-08-10 DIAGNOSIS — M609 Myositis, unspecified: Secondary | ICD-10-CM

## 2022-08-10 DIAGNOSIS — Z136 Encounter for screening for cardiovascular disorders: Secondary | ICD-10-CM

## 2022-08-10 DIAGNOSIS — K219 Gastro-esophageal reflux disease without esophagitis: Secondary | ICD-10-CM

## 2022-08-10 DIAGNOSIS — E039 Hypothyroidism, unspecified: Secondary | ICD-10-CM

## 2022-08-10 DIAGNOSIS — Z85850 Personal history of malignant neoplasm of thyroid: Secondary | ICD-10-CM

## 2022-08-10 DIAGNOSIS — R52 Pain, unspecified: Secondary | ICD-10-CM

## 2022-08-10 DIAGNOSIS — D539 Nutritional anemia, unspecified: Secondary | ICD-10-CM

## 2022-08-10 DIAGNOSIS — E890 Postprocedural hypothyroidism: Secondary | ICD-10-CM

## 2022-08-10 DIAGNOSIS — C50919 Malignant neoplasm of unspecified site of unspecified female breast: Secondary | ICD-10-CM

## 2022-08-10 DIAGNOSIS — W19XXXA Unspecified fall, initial encounter: Secondary | ICD-10-CM

## 2022-08-10 DIAGNOSIS — I2081 Angina pectoris with coronary microvascular dysfunction: Secondary | ICD-10-CM

## 2022-08-10 DIAGNOSIS — R931 Abnormal findings on diagnostic imaging of heart and coronary circulation: Secondary | ICD-10-CM

## 2022-08-10 DIAGNOSIS — F32A Depression: Secondary | ICD-10-CM

## 2022-08-10 DIAGNOSIS — M5030 Other cervical disc degeneration, unspecified cervical region: Secondary | ICD-10-CM

## 2022-08-10 DIAGNOSIS — C801 Malignant (primary) neoplasm, unspecified: Secondary | ICD-10-CM

## 2022-08-10 DIAGNOSIS — S83209A Unspecified tear of unspecified meniscus, current injury, unspecified knee, initial encounter: Secondary | ICD-10-CM

## 2022-08-10 LAB — CREATINE KINASE-CPK: CK TOTAL: 155 U/L (ref 21–215)

## 2022-08-10 LAB — C REACTIVE PROTEIN (CRP): C-REACTIVE PROTEIN: 0.3 mg/dL (ref <1.0)

## 2022-08-10 MED ORDER — AMLODIPINE 5 MG PO TAB
5 mg | ORAL_TABLET | Freq: Every day | ORAL | 3 refills | Status: AC
Start: 2022-08-10 — End: ?

## 2022-08-10 MED ORDER — EZETIMIBE 10 MG PO TAB
10 mg | ORAL_TABLET | Freq: Every day | ORAL | 3 refills | Status: AC
Start: 2022-08-10 — End: ?

## 2022-08-10 NOTE — Telephone Encounter
I spoke with Dr. Kendrick Ranch nurse via Teams and she stated she will pass along to Dr. Starleen Blue and he will address at [place] this morning at 9am.

## 2022-08-10 NOTE — Progress Notes
Date of Service: 08/10/2022  [facility] Cardio-Oncology Clinic Visit  Oncology: Dr. Franco Collet RN  is a 62 y.o. female.       HPI     Cindy Randolph presents today for initial cardio-oncology clinic evaluation of moderate CAD and exertional angina that is disporportiao nlte the the moderate coronary artery disease Since he says the exertional chest pain began after she began Verzenio.  She started on the mid dose 100 mg daily had cognitive dysfunction and was dropped from 100 mg daily to 50 mg on January 21, 2022 with cessation Verzenio on June 22, 2022.  She has been on letrozole in association with the minocycline but both have been discontinued.  She is now on exemestane/Aromasin which is the best tolerated of the aromatase inhibitors that she has had.    She was still having chest pain to reported to Dr. Cassandria Anger that when she saw her in February  inpatient assess chest pain. Mod CAD after LAD ischemia LAD EF 42%  Began crestor 5 about a month before her July 17, 2022 lipid profile was done when she was at [facility] for evaluation of her chest pain.  After 5 days on the Crestor 20 that Dr. But got recommended she started to have some leg cramps for the first time.  She is not sure whether this is attributable to the Crestor but is a new sensation beginning within a week after she started the Crestor 20    She has had 2 episodes of exertional shortness of air that did not make her stop.  Both of these episodes let her to stop what she was doing and lay down.  Symptoms resolved over about 20 minutes to become absent.  She feels better if she stops what she is doing when she gets he gets the symptoms although some of the symptoms she gets when she continues activity when she is feeling the chest discomfort they relate to irritability just from not being able to stop what she is doing.    When she was hospitalized at [place] she was started on amlodipine 2.5 and Toprol-XL 25 with cessation of lisinopril.  Her blood pressures ranged from about 115-135 with high side outliers perhaps 140 on occasion.    2/24 eval Cindy Randolph: exertional chest pain. She mostly notices this when she is walking her dog and if she climbs a steep hill etc., she will develop chest pressure that resolves with rest. began after she began verzenio palpitations aborted by Valsalva,    Latest Reference Range & Units 03/16/21 12:27 02/15/22 07:36 07/17/22 04:57   Cholesterol <200 MG/DL 130 865 (H) 784   Triglycerides <150 MG/DL 696 (H) 295 92   HDL >28 MG/DL 54 74 38 (L)   LDL <413 mg/dL 244 (H) 010 (H) 68     Husband still Transport planner at [employer] in [place] and her husband are now living in [place] as he is able to teach at the [employer] remotely.  They have 2 grandchildren living close by and there [facility].    Coronary artery calcium score 02/2022: Total calcium score is 136 which represents the 80th percentile rank for females age 64-60.  Left main: 0 ,LAD: 109, LCX: 27,RCA: 0        07/17/22 04:57   Lipoprotein (a) 132 (H)       FH F was smoker MI 20s CABG in 76s then DM, Peritoneal  dialysis, SCD 65  on amio.     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  She and her husband moved to [place] when he was transferred to the war College.  They have been living in this general area since 2001           Vitals:    08/10/22 0918   BP: 122/70   BP Source: Arm, Right Upper   Pulse: 88   SpO2: 96%   Weight: 73 kg (161 lb)   Height: 157.5 cm (5' 2)     Body mass index is 29.45 kg/m?Marland Kitchen     Past Medical History  Patient Active Problem List    Diagnosis Date Noted    Generalized body aches 07/19/2022    Coronary artery disease involving native coronary artery of native heart with angina pectoris (HCC) 07/19/2022    Chronic low back pain with bilateral sciatica 06/22/2022    Post-surgical hypothyroidism 06/22/2022    Cervical radiculopathy 06/22/2022    Ascending aorta dilatation (HCC) 06/22/2022    Radiation-induced pulmonary fibrosis (HCC) 06/22/2022    Recurrent major depression in full remission (HCC) 06/22/2022    Migraine with aura and without status migrainosus, not intractable 06/22/2022    Chronic pain of multiple joints 06/22/2022    Elevated coronary artery calcium score 06/17/2022     04/01/2022- Per OV Dr. Maisie Fus   03/06/2022- CT Cardiac Calcium Score: Total calcium score is 136 which represents the 80th percentile rank for females age 56-60. A calcium score of 136 correlates with moderate calcification. There is minimal calcific atheromatous plaque in the visualized portions of the ascending and descending aorta. 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 Stable dilated ascending aorta measuring 4.1 cm, previously 4.1 cm.       Hiatal hernia with gastroesophageal reflux 06/17/2022     04/01/2022- Per OV Dr. Maisie Fus       Menopausal osteoporosis 07/13/2021    Excess skin of breast 01/15/2021    Malignant neoplasm of left breast in female, estrogen receptor positive  (HCC) 12/22/2020     DIAGNOSIS:  Left grade 1 ILC (ER  96%, PR 10%, HER2 0, Ki-67 3%) at 12:00 dx 11/2020     HISTORY:  Cindy Randolph is a female who presented to the [facility] on 12/24/2020 at age 89 for left breast cancer.  She reports noticing Left breast intermittent discomfort over the last 2 months.  She had Screening mammogram at [facility] on 11/25/2020 which identified a focal asymmetry in the left breast.  She returned for left diagnostic mammgoram and ultrasound on 12/04/2020 which showed an ill-defined hypoechoic area at 12:00 7 cm FTN measuring 1.7 cm.   She underwent Left ultrasound guided biopsy on 12/10/2020 which revealed grade 1 hormone positive, HER2 negative ILC with associated LCIS. She has no breast complaints.  She proceeded with Bilateral total mastectomy/Left SLNB with oncoplastic flat closure on 01/22/2021.  Final surgical pathology revealed multifocal grade 1 ILC that merge to measuring 7.4 cm with associated LCIS, clear margins and 5/5 lymph nodes. The right breast is benign.   Adjuvant chemotherapy and cALND were recommended.  She completed adjuvant chemotherapy of dd AC from 10/3 to 04/06/2021, taxol was recommended but she declined.  She returned for Left completion ALND on 05/14/2021.  Final surgical pathology revealed 6/14 lymph nodes positive. She finished radiation with Dr. Haskel Khan on  07/01/21.    BREAST IMAGING:  Mammogram:    - Screening mammogram 11/25/2020 ([facility]) revealed scattered fibroglandular tissue density.  Benign appearing microcalcification. Unchanged right upper central anterior breast low density circumscribed masses dating back to 2013.  Focal asymmetry located within the left posterior central breast, 12:00, 7.3 cm FTN most conspicuous on MLO.  Recommend spot compression.    - Left diagnostic mammogram 12/04/2020 ([facility]) revealed irregular density at 12:00 left breast persists.  Ultrasound recommended.    Ultrasound:    Left targeted ultrasound 12/04/2020 ([facility]) revealed an ill-defined area which was slightly hypoechoic at 12:00, 7 cm FTN.  This was indeterminate and biopsy was recommended.   MRI:    - Breast MRI 12/26/2020 ([place]) The breast tissue is scattered areas of fibroglandular tissue.   There is mild background parenchymal enhancement. Left breast: There are multiple faint discontiguous irregular enhancing masses and discontiguous intervening nonmass enhancement involving predominantly the upper and inner left breast from anterior to posterior depth measuring in aggregate 6.3 cm AP by 3.6 cm transverse by 6.4 cm craniocaudal. Several areas demonstrate persistent enhancement kinetics. The artifact from tissue marker clips (ribbon and heart) at the site of biopsy demonstrating malignancy are seen within the upper left breast at the 12:00 position, middle depth (image 80). A representative discontiguous irregular enhancing mass is seen within the inner left breast at the 9:00 position, middle depth as on image 111, which demonstrates a mammographic correlate on outside CC tomosynthesis slice 18. Anterior extent of abnormal enhancement extends to the base of the left nipple. Posterior extent of abnormal enhancement is approximately 2.1 cm anterior to the underlying pectoralis muscle. There are greater than 5 small round morphologically abnormal and asymmetric level 1 left axillary lymph nodes. No suspicious level 2 or level 3 lymph nodes are seen. No suspicious internal mammary lymph nodes are seen. Right breast: No suspicious mass or nonmass enhancement is seen within the right breast. No suspicious right axillary or internal mammary lymph nodes are identified. Incidental Findings: None     REPRODUCTIVE HEALTH:  Age at first Menarche:  39  Age at First Live Birth:  29  Age at Menopause:  33  Gravida:  8  Para: 8  Breastfeeding:  yes    PROCEDURE:  Bilateral Total mastectomy/Left SLNB oncoplastic flat closure 01/22/2021 ([facility])  2. Left ALND 05/14/2021 [facility])  PATHOLOGY: multifocal grade 1 ILC that merge to measuring 7.4 cm with associated LCIS, clear margins and 5/5 lymph nodes. The right breast is benign.   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  No family history of ovarian or prostate cancer.   MEDICAL ONCOLOGY:    Dr. Neil Crouch Adjuvant chemotherapy:  ddACx 4 completed 04/06/2021, Taxol recommended but declined; Letrozole/Verzenio  REFERRED BY:  Dr. Erskine Emery  Formatting of this note might be different from the original. Formatting of this note might be different from the original. DIAGNOSIS:  Left grade 1 ILC (ER 91-100, PR 11-20, HER 1+, Ki 67 2-5%) at 12:00 dx 11/2020 HISTORY:  Ms. Purviance is a female who presented to the [facility] on 12/24/2020 at age 39 for left breast cancer.  She reports noticing Left breast intermittent discomfort over the last 2 months.  She had Screening mammogram at [facility] on 11/25/2020 which identified a focal asymmetry in the left breast.  She returned for left diagnostic mammgoram and ultrasound on 12/04/2020 which showed an ill-defined hypoechoic area at  12:00 7 cm FTN measuring 1.7 cm.   She underwent Left ultrasound guided biopsy on 12/10/2020 which revealed grade 1 hormone positive, HER2 negative ILC with associated LCIS. She has no breast complaints. BREAST IMAGING: Mammogram:   - Screening mammogram 11/25/2020 ([facility]) revealed scattered fibroglandular tissue density.  Benign appearing microcalcification. Unchanged right upper central anterior breast low density circumscribed masses dating back to 2013.  Focal asymmetry located within the left posterior central breast, 12:00, 7.3 cm FTN most conspicuous on MLO.  Recommend spot compression.   - Left diagnostic mammogram 12/04/2020 ([facility]) revealed irregular density at 12:00 left breast persists.  Ultrasound recommended.  Ultrasound:  Left targeted ultrasound 12/04/2020 ([facility]) revealed an ill-defined area which was slightly hypoechoic at 12:00, 7 cm FTN.  This was indeterminate and biopsy was recommended. MRI:   - Breast MRI 12/26/2020 ([place]) .... REPRODUCTIVE HEALTH: Age at first Menarche:  17 Age at First Live Birth:  64 Age at Menopause:  66 Gravida:  8 Para: 8 Breastfeeding:  yes PROCEDURE:  pending PERTINENT PMH:  Thyroid Cancer (1980- thyroidectomy), HTN FAMILY HISTORY:  Mother- Breast cancer (65).  No family history of ovarian or prostate cancer. PHYSICAL EXAM on PRESENTATION:   MEDICAL ONCOLOGY:    Dr. Neil Crouch REFERRED BY:  Dr. Erskine Emery      Hypermobility syndrome 05/24/2014    Degenerative disc disease, lumbar 02/21/2014    Primary hypertension 11/13/2007         Review of Systems   Constitutional: Negative.   HENT: Negative.     Eyes: Negative. Cardiovascular: Negative.    Respiratory: Negative.     Endocrine: Negative.    Hematologic/Lymphatic: Negative.    Skin: Negative.    Musculoskeletal: Negative.    Gastrointestinal: Negative.    Genitourinary: Negative.    Neurological: Negative.    Psychiatric/Behavioral: Negative.     Allergic/Immunologic: Negative.    All other systems reviewed and are negative.  14 organ system review noted. It is negative except as reported in current narrative or above in the ROS section. This is a patient centered review of systems that was stated by the patient in her terms prior to my personal problem oriented interview with the patient     Physical Exam  General: Patient in no distress, looks generally healthy. Skin warm and dry.    Mucous membranes moist.  Eyes: Sclera non icteric,Pupils equal and round    Carotids: no bruits    Thyroid not enlarged.  Neck veins: CVP <6 normal, no V wave, no HJR    amastia    Respiratory: Breathing comfortably. Lungs clear to percussion & auscultation. No rales, rhonchi or wheezing   Cardiac: Regular rhythm. LV impulse not palpable. Normal S1 & S2, Fourth heart sound, no rub or S3. No murmur  Abdomen: soft, non-tender, no masses,bruits,hepatic or aortic enlargement. + bowel sounds.   Femoral arteries: Good pulses, no bruits.  Legs/feet: Normal PT pulses, no edema.   Motor: Normal muscle strength. Cognitive: Pleasant demeanor. Good insight. No depression     Cardiovascular Studies  The ASCVD Risk score (Arnett DK, et al., 2019) failed to calculate for the following reasons:    The valid total cholesterol range is 130 to 320 mg/dL  Today's EKG shows sinus rhythm rate 80 borderline voltage for LVH normal T waves,  QRS duration 488 QTc be normal at 438.   Reviewed her EKG shows she had T wave inversion about 2 mm in lead III on electrocardiogram of February 24  resolved on today's EKG T wave was isoelectric on February 5 and on other EKGs dating back to February 24, 2021 initial EKG.  QT interval on October 4 was called prolonged with QTc B42 QTc FD 462-second EKG February 25, 2023 she shows noise on baseline QT C being 473 QTc FDG 446 her EKGs in February 2024 after stopping the minocycline are more unremarkable QTc be 426 QTcFd D408.    She wanted my thoughts on her 2-week ambulatory monitor with a summary statement being as follows 13 day monitor associated with sinus rhythm and brief symptomatic SVT lasting up to 10 seconds in nature.  She had symptoms of palpitations and skipped beats with and without presence of ectopy.  The majority of her ectopy was were single PVCs or APCs.  She had palpitations with 115 episodes of SVT.      Cardiovascular Health Factors  Vitals BP Readings from Last 3 Encounters:   08/10/22 122/70   08/06/22 (!) 131/90   08/05/22 137/89     Wt Readings from Last 3 Encounters:   08/10/22 73 kg (161 lb)   08/06/22 73 kg (161 lb)   08/05/22 73.1 kg (161 lb 3.2 oz)     BMI Readings from Last 3 Encounters:   08/10/22 29.45 kg/m?   08/06/22 29.44 kg/m?   08/05/22 29.48 kg/m?      Smoking Social History     Tobacco Use   Smoking Status Never    Passive exposure: Past   Smokeless Tobacco Never   Tobacco Comments    Significant second hand smoke from parents 27-73 years old      Lipid Profile Cholesterol   Date Value Ref Range Status   07/17/2022 109 <200 MG/DL Final     HDL   Date Value Ref Range Status   07/17/2022 38 (L) >40 MG/DL Final     LDL   Date Value Ref Range Status   07/17/2022 68 <100 mg/dL Final     Triglycerides   Date Value Ref Range Status   07/17/2022 92 <150 MG/DL Final      Blood Sugar No results found for: HGBA1C  Glucose   Date Value Ref Range Status   07/17/2022 97 70 - 100 MG/DL Final   16/02/9603 94 70 - 100 MG/DL Final   54/01/8118 91 70 - 100 MG/DL Final          Problems Addressed Today  Encounter Diagnoses   Name Primary?    Screening for heart disease Yes       Assessment and Plan     Cindy Randolph has premature coronary disease detected during evaluation of exertional chest pain.  This is subclinical nonobstructive coronary disease but may be associated with endothelial dysfunction or microvascular spasm that could promote exertional chest symptoms.  The symptoms are improved but not resolved on amlodipine 2.5 Toprol XL 25 without lisinopril.  Recommending boosting the amlodipine to 5 mg daily..  She is taking them both at night based on readings about fatigue with these drugs.  I recommended she switch the amlodipine to morning which may give her more coverage during the day when she is active is avoiding her chest symptoms.  Toprol is associated with some fatigue so living at night is fine this also was avoid peak hypotension and she is taking both once in the morning.    Her coronary artery calcification in the 80th percentile risk suggest that her LDL has done more damage with regard to atherosclerosis  then was predicted by the L degree of elevation.  I have recommended that we focus on taking the LDL down as low as possible and not use guidelines which basically are proxies for deciding when the LDL reduction is good enough.  She knows I am recommending off label and that I would be recommending even a higher dose of Crestor then 20 but she has some leg cramps that might be related to the first 5 days of her Crestor 20 rather than 5 mg.  She wonders whether a CK should be drawn.  She has high healthcare literacy and I am drawing CK to reassure her her that we are checking everything necessary and to reassure me that she is not having an extraordinarily early CK release with moderate dose Crestor.    Repatha was suggested to her as an alternative treatment for her LP(a) elevation.  I told her I don't endorse this because I do not believe LDL reduction with Repatha is thought to be a mechanism of atherosclerosis facilitated by LPA.  There is no FDA approved treatment for LPA elevation.  If 1 is approved it will be widely promoted in the press and she may be aware of it before I am.  In that case she can be in touch and will discuss it.    I do not believe her Aromasin or any element of her recent cancer treatment is principally involved in her cardiac symptoms.    She does remain at some risk for cardiomyopathy with her history of left breast radiation and 240 mg/m? Adriamycin but her overall risk profile for cardiotoxicity from anthracycline is low.  There is no suggestion that exemestane is proarrhythmic or in any way cardiotoxic is a direct cause of any cardiac side effects or disease.    Her last echo looked good with normal LV systolic function basal septal hypertrophy biplane EF 55% and GLS normal -22% dilated ascending aorta 4.1% has fair for fairly good correlation with CT CAC score measured aorta at 4.1 cm.  Near-term follow-up with CT or echo is not needed with his minimal dilation.  Wall thickness energy disproportionate to the degree of hypertension I am aware of with septum 1.3 and posterior wall 1.27 cm.  Interesting lady her overall LV mass did not calculated as being increased at 71 g/m? per range to 95.    She does not need follow-up with Dr. Irine Seal and me.  She indicated willingness to follow with me while adjusting meds.     60   minutes were spent in completion of this encounter.  The time was spent reviewing records,  interviewing patient, doing exam, developing diagnosis, creating treatment plan written in patient oriented  terminology  for the AVS, explaining it to the patient and entering further information in the EMR.      Addendum CK today is in the normal range.   Latest Reference Range & Units 08/10/22 11:43   Creatine Kinase 21 - 215 U/L 155     NB: The free text in this document was generated through Dragon(TM) software with editing and proofreading  done by the author of this document Dr. Mable Paris MD, [facility] principally at the point of care. Some errors may persist. If there are questions about content in this document please contact Dr. Hale Bogus.  The written information I provided the patient at the conclusion of today's encounter is as follows  Current Medications (including today's revisions)   acetaminophen (TYLENOL) 325 mg tablet Take two tablets by mouth every 6 hours. Take scheduled for 3 days after surgery, then as needed. Do not exceed 4,000mg  in a 24 hour period.    amLODIPine (NORVASC) 2.5 mg tablet Take one tablet by mouth daily.    aspirin 81 mg chewable tablet Chew one tablet by mouth daily.    biotin 1 mg cap Take one capsule by mouth daily.    CALCIUM PO Take 600 mg by mouth daily.    CHOLEcalciferoL (vitamin D3) 1,000 units tablet Take one tablet by mouth daily.    clobetasoL (TEMOVATE) 0.05 % topical ointment Apply  topically to affected area twice daily. Apply to areas of rash on hands twice daily; do NOT use on face/groin/underarms, use up to 2 weeks per month    cyclobenzaprine (FLEXERIL) 5 mg tablet Take one tablet by mouth three times daily.    diclofenac sodium (VOLTAREN) 1 % topical gel Apply four g topically to affected area daily as needed.    duloxetine DR (CYMBALTA) 20 mg capsule Take one capsule by mouth daily.    exemestane (AROMASIN) 25 mg tablet Take one tablet by mouth daily. Take after a meal.    Fish,Bora,Flax Oils-OM3,6,9 #1 (TRIPLE OMEGA) 400-400-400 mg capsule Take one capsule by mouth daily.    metoprolol succinate XL (TOPROL XL) 25 mg extended release tablet Take one tablet by mouth at bedtime daily.    omeprazole DR (PRILOSEC) 40 mg capsule Take one capsule by mouth daily.    other medication K Complete K1 & K2 as MK-4 & MK-7 : Take 1 softgel by mouth once daily    rosuvastatin (CRESTOR) 20 mg tablet Take one tablet by mouth daily.    Selenium 100 mcg tab Take one tablet by mouth daily.    SYNTHROID 112 mcg tablet Take one tablet by mouth daily 30 minutes before breakfast.    traMADoL 25 mg tablet Take one tablet by mouth every 12 hours as needed.    vitamins, B complex tab Take one tablet by mouth daily.    vitamins, multiple cap Take one capsule by mouth daily.    zinc sulfate 220 mg (50 mg elemental zinc) capsule Take one capsule by mouth daily.

## 2022-08-10 NOTE — Patient Instructions
Thank you for coming to see us today.   Please call our office or send a message through MyChart if you have any questions or concerns.                                                                                                                 [facility]    [place] [facility]  [address]  [place], [place] [address]      [facility]  [address]  [place], [place] [address]      Dr. Lauren Nye  [phone] (Nurse Camie Swisher)  [phone] (Fax)      [email or web address]

## 2022-08-12 ENCOUNTER — Encounter: Admit: 2022-08-12 | Discharge: 2022-08-12 | Payer: MEDICARE

## 2022-08-12 ENCOUNTER — Ambulatory Visit: Admit: 2022-08-12 | Discharge: 2022-08-12 | Payer: MEDICARE

## 2022-08-12 DIAGNOSIS — M609 Myositis, unspecified: Secondary | ICD-10-CM

## 2022-08-12 DIAGNOSIS — R931 Abnormal findings on diagnostic imaging of heart and coronary circulation: Secondary | ICD-10-CM

## 2022-08-12 DIAGNOSIS — I10 Essential (primary) hypertension: Secondary | ICD-10-CM

## 2022-08-12 DIAGNOSIS — I77810 Thoracic aortic ectasia: Secondary | ICD-10-CM

## 2022-08-12 DIAGNOSIS — Z136 Encounter for screening for cardiovascular disorders: Secondary | ICD-10-CM

## 2022-08-12 DIAGNOSIS — Z9189 Other specified personal risk factors, not elsewhere classified: Secondary | ICD-10-CM

## 2022-08-12 NOTE — Progress Notes
Pt here today for treadmill only exam. Patient developed LBB during exercise on the treadmill. Patient denied chest pain. Patient also had frequent ectopy during recovery. EKG reviewed in clinic with Dr. Rosendo Gros. Patient okay to leave and f/u with Drs. Kovelamundi and [employer]. No new orders received.

## 2022-08-18 ENCOUNTER — Encounter: Admit: 2022-08-18 | Discharge: 2022-08-18 | Payer: MEDICARE

## 2022-08-18 NOTE — Patient Instructions
Routine Clinic Information:  Please don't hesitate to call if you have any problems or questions.     My nurse, Gustavia Carie, RN, can be reached at [phone].  If she does not answer, please leave a voicemail as she is probably rooming other patients. Please leave your name, the spelling of your last name, date of birth, phone number they can call you back and a description of why you are calling. You may also message [place] in MyChart.  Our fax number is [phone].    As a part of the CARES act, starting 08/23/2019, some results will be released to mychart automatically.  With these changes you may see your results before I do.  Critical lab results will be addressed immediately, but otherwise please  give me 72 hours to view and respond to your results before reaching out with questions.       Medication refills:  Please use the MyChart Refill request or contact your pharmacy directly to request medication refills.  Please allow at least 3 business days for refill requests.  Test results:  You will receive your test results at your appointment, by MyChart (our patient portal), or via phone or letter.   We prefer to use MyChart as much as possible.  If you are expecting results and have not heard from my office within 2 weeks of your testing, please send a MyChart message or call my office.    Lab work:  The main lab is on the 1st floor of the [facility].   Other lab locations are available; please see [email or web address]  Radiology is on the 2nd floor of the [address], among several other locations.  Please contact the radiology department to schedule at [phone].  Scheduling is available directly through MyChart, or via phone at [phone].  Same day, urgent care, and Telehealth appointments are available.    Appointment reminders may be received over the phone and by text.  Communication preferences can be managed in MyChart to ensure you receive important appointment notifications.  Support for many chronic illnesses is available through [employer]: [email or web address] or [phone].  We offer Integrated Behavioral Health services, nutrition support, and pharmacist support services free of charge.  To schedule an appointment with a specialist and/or testing please call the central scheduling number at [phone].     You may see my nurse practitioner, Olga Coaster, for urgent needs or if I am unavailable.  We are working as a team to provide better continuity and access to our patients.      If you ever have emergency symptoms of chest pain, shortness of breath or uncontrolled or unexplained pain, please go to your closest emergency room.  You can give [place] an update after you have addressed any emergency.        For urgent issues after business hours/weekends/holidays call [phone] and request for the outpatient internal medicine physician to be paged.     We offer same day appointments for your acute health concerns. These appointments are on a first come, first serve basis. Please call [phone] if you would like to make an appointment.

## 2022-08-19 ENCOUNTER — Encounter: Admit: 2022-08-19 | Discharge: 2022-08-19 | Payer: MEDICARE

## 2022-08-19 ENCOUNTER — Ambulatory Visit: Admit: 2022-08-19 | Discharge: 2022-08-20 | Payer: MEDICARE

## 2022-08-19 DIAGNOSIS — R52 Pain, unspecified: Secondary | ICD-10-CM

## 2022-08-19 DIAGNOSIS — Z8249 Family history of ischemic heart disease and other diseases of the circulatory system: Secondary | ICD-10-CM

## 2022-08-19 DIAGNOSIS — M810 Age-related osteoporosis without current pathological fracture: Secondary | ICD-10-CM

## 2022-08-19 DIAGNOSIS — G43109 Migraine with aura, not intractable, without status migrainosus: Secondary | ICD-10-CM

## 2022-08-19 DIAGNOSIS — Z789 Other specified health status: Secondary | ICD-10-CM

## 2022-08-19 DIAGNOSIS — M549 Dorsalgia, unspecified: Secondary | ICD-10-CM

## 2022-08-19 DIAGNOSIS — M5134 Other intervertebral disc degeneration, thoracic region: Secondary | ICD-10-CM

## 2022-08-19 DIAGNOSIS — M2550 Pain in unspecified joint: Secondary | ICD-10-CM

## 2022-08-19 DIAGNOSIS — IMO0002 Ulcer: Secondary | ICD-10-CM

## 2022-08-19 DIAGNOSIS — M5030 Other cervical disc degeneration, unspecified cervical region: Secondary | ICD-10-CM

## 2022-08-19 DIAGNOSIS — C50919 Malignant neoplasm of unspecified site of unspecified female breast: Secondary | ICD-10-CM

## 2022-08-19 DIAGNOSIS — E079 Disorder of thyroid, unspecified: Secondary | ICD-10-CM

## 2022-08-19 DIAGNOSIS — E890 Postprocedural hypothyroidism: Secondary | ICD-10-CM

## 2022-08-19 DIAGNOSIS — E7800 Pure hypercholesterolemia, unspecified: Secondary | ICD-10-CM

## 2022-08-19 DIAGNOSIS — M4800 Spinal stenosis, site unspecified: Secondary | ICD-10-CM

## 2022-08-19 DIAGNOSIS — R413 Other amnesia: Secondary | ICD-10-CM

## 2022-08-19 DIAGNOSIS — F3342 Major depressive disorder, recurrent, in full remission: Secondary | ICD-10-CM

## 2022-08-19 DIAGNOSIS — F419 Anxiety disorder, unspecified: Secondary | ICD-10-CM

## 2022-08-19 DIAGNOSIS — Z9013 Acquired absence of bilateral breasts and nipples: Secondary | ICD-10-CM

## 2022-08-19 DIAGNOSIS — Z23 Encounter for immunization: Secondary | ICD-10-CM

## 2022-08-19 DIAGNOSIS — K449 Diaphragmatic hernia without obstruction or gangrene: Secondary | ICD-10-CM

## 2022-08-19 DIAGNOSIS — C73 Malignant neoplasm of thyroid gland: Secondary | ICD-10-CM

## 2022-08-19 DIAGNOSIS — F32A Depression: Secondary | ICD-10-CM

## 2022-08-19 DIAGNOSIS — I10 Essential (primary) hypertension: Secondary | ICD-10-CM

## 2022-08-19 DIAGNOSIS — K219 Gastro-esophageal reflux disease without esophagitis: Secondary | ICD-10-CM

## 2022-08-19 DIAGNOSIS — W19XXXA Unspecified fall, initial encounter: Secondary | ICD-10-CM

## 2022-08-19 DIAGNOSIS — Z923 Personal history of irradiation: Secondary | ICD-10-CM

## 2022-08-19 DIAGNOSIS — M5136 Other intervertebral disc degeneration, lumbar region: Secondary | ICD-10-CM

## 2022-08-19 DIAGNOSIS — D539 Nutritional anemia, unspecified: Secondary | ICD-10-CM

## 2022-08-19 DIAGNOSIS — R32 Unspecified urinary incontinence: Secondary | ICD-10-CM

## 2022-08-19 DIAGNOSIS — R011 Cardiac murmur, unspecified: Secondary | ICD-10-CM

## 2022-08-19 DIAGNOSIS — Z85850 Personal history of malignant neoplasm of thyroid: Secondary | ICD-10-CM

## 2022-08-19 DIAGNOSIS — C801 Malignant (primary) neoplasm, unspecified: Secondary | ICD-10-CM

## 2022-08-19 DIAGNOSIS — E039 Hypothyroidism, unspecified: Secondary | ICD-10-CM

## 2022-08-19 DIAGNOSIS — B999 Unspecified infectious disease: Secondary | ICD-10-CM

## 2022-08-19 DIAGNOSIS — S83209A Unspecified tear of unspecified meniscus, current injury, unspecified knee, initial encounter: Secondary | ICD-10-CM

## 2022-08-19 DIAGNOSIS — E782 Mixed hyperlipidemia: Secondary | ICD-10-CM

## 2022-08-19 DIAGNOSIS — R519 Generalized headaches: Secondary | ICD-10-CM

## 2022-08-19 MED ORDER — DULOXETINE 20 MG PO CPDR
ORAL_CAPSULE | ORAL | 0 refills | 60.00000 days | Status: AC
Start: 2022-08-19 — End: ?

## 2022-08-19 NOTE — Assessment & Plan Note
On Duloxetine 20mg . No significant improvement noted.  -CRP WNL  -Increase Duloxetine to 40mg  daily for 2 weeks, then to 60mg  daily.  -Will consider rheumatology referral if no improvement with increased dose.

## 2022-08-19 NOTE — Assessment & Plan Note
Ordered neuropsychological testing.

## 2022-08-19 NOTE — Assessment & Plan Note
In remission  Currently duloxetine 20 mg daily to help with generalized pain, will gradually increase to 60 mg daily

## 2022-08-19 NOTE — Assessment & Plan Note
Follows with endocrinology  -Recently decreased Levothyroxine to 100mg .  -Has repeat check TSH in 1 week.

## 2022-08-19 NOTE — Assessment & Plan Note
On Omeprazole 40mg . Patient desires surgical intervention due to symptoms and desire to discontinue Omeprazole.  -Continue current management.  -Discuss surgical options with gastroenterologist at upcoming appointment.

## 2022-08-19 NOTE — Progress Notes
08/19/2022     Subjective:       Patient Reported Other  What topic(s) would you like to cover during your appointment?:  The doctor?s thoughts on my meds. Maybe increase Duloxetine?? How long to see decrease in pain after starting duloxetine?  Second Shingles vaccine.  Can?t remember if I got a pneumonia shot/or if I should?  C-reactive protein was WNL--can I still see the Rheumatologist?  Please describe the issue(s) and history with the issue (location, severity, duration, symptoms, etc.).:  Generalized body pains, aches, fatigue, brain fog. New Dx CAD  What has been done so far to take care of the issue(s)?:  Saw Dr. Hale Bogus, cardiologist.  Weldon Picking, Crestor, Norvasc, Toprol XL, Treadmill stress test which triggered runs of pvcs and though no ?pain? I felt awful and scared.  Saw Dr. Hal Hope, Spine doc  Continue Tylenol, Tramadol, PT 2xweek  On own: following a more plant-based, whole grain diet. Following nutrition vlog called [employer], which sent a 2 week blood sugar monitor which has taught me a lot ((mind-blown)). Ordered an e-bike to get out and exercise more.  Walking without hills.  What are your goals for this visit?:  Shingles vac #2  Discuss the above.    Cindy Randolph is a 62 y.o. female.  Weight Gain     Has history of left breast cancer s/p mastectomy in 2022, cervical spine radiculopathy, lumbar degenerative disease, osteoporosis, migraine and depression.     Seen by cardiology on 08/10/2022 for subclinical nonobstructive coronary artery disease. Her amlodipine was increased to 50 mg and was continued on Toprol-XL 25 mg daily.  Was recommended on working to lower her LDL and was recommended to increase Crestor to 20 mg daily. Saw oncology team on 08/05/2022 for routine follow-up after completion of ration therapy.  Was told there is no evidence of recurrence of disease or progression.  Will continue to follow-up with continuation of anastrozole. Did see spine clinic on 08/06/2022 was recommended continue with lifestyle modifications and avoiding aggravating maneuvers. Recommended to continue with duloxetine for now.    Is under the care of multiple specialists, including a cardiologist and an oncologist. The patient reports being prescribed Crestor and Zetia by the cardiologist to manage cholesterol levels, did initially experience muscle cramps which has since resolved. Currently on crestor 20 mg. The patient underwent a treadmill stress test, which resulted in an alarming increase in heart rate, but was deemed within normal limits due to the absence of chest pain. Since the test, she has been cautious about physical exertion.    The patient also reports a history of migraines, which have recently presented as pressure-like headaches in the temples, sometimes radiating to the teeth. These headaches have been occurring every other day for the past two to three weeks, with varying intensity. She has been managing the pain with Tylenol and Tramadol, which seem to provide relief.  No complains of any associated symptoms including vision changes, limb weakness, numbness or tingling.    She has been on duloxetine for pain management, but has not noticed a significant difference. The patient also reports memory issues, which have been increasing over the past couple of months. She has expressed concerns about these cognitive changes and is open to undergoing a neurocognitive test.    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 She has expressed a desire for surgical intervention to manage the hernia,  due to concerns about long-term use of omeprazole and the potential for acid reflux.    She has been on thyroid medication, which was recently reduced to 100mg . The patient has expressed concerns about potential liver issues due her medication use. However, her liver function tests have been consistently normal.               Objective: acetaminophen (TYLENOL) 325 mg tablet Take two tablets by mouth every 6 hours. Take scheduled for 3 days after surgery, then as needed. Do not exceed 4,000mg  in a 24 hour period.    amLODIPine (NORVASC) 5 mg tablet Take one tablet by mouth daily.    aspirin 81 mg chewable tablet Chew one tablet by mouth daily.    biotin 1 mg cap Take one capsule by mouth daily.    CALCIUM PO Take 600 mg by mouth daily.    CHOLEcalciferoL (vitamin D3) 1,000 units tablet Take one tablet by mouth daily.    clobetasoL (TEMOVATE) 0.05 % topical ointment Apply  topically to affected area twice daily. Apply to areas of rash on hands twice daily; do NOT use on face/groin/underarms, use up to 2 weeks per month    diclofenac sodium (VOLTAREN) 1 % topical gel Apply four g topically to affected area daily as needed.    duloxetine DR (CYMBALTA) 20 mg capsule Take 40 mg daily for 2 weeks followed by 60 mg daily after    exemestane (AROMASIN) 25 mg tablet Take one tablet by mouth daily. Take after a meal.    ezetimibe (ZETIA) 10 mg tablet Take one tablet by mouth daily.    Fish,Bora,Flax Oils-OM3,6,9 #1 (TRIPLE OMEGA) 400-400-400 mg capsule Take one capsule by mouth daily.    metoprolol succinate XL (TOPROL XL) 25 mg extended release tablet Take one tablet by mouth at bedtime daily.    omeprazole DR (PRILOSEC) 40 mg capsule Take one capsule by mouth daily.    other medication K Complete K1 & K2 as MK-4 & MK-7 : Take 1 softgel by mouth once daily    rosuvastatin (CRESTOR) 20 mg tablet Take one tablet by mouth daily.    Selenium 100 mcg tab Take one tablet by mouth daily.    SYNTHROID 112 mcg tablet Take one tablet by mouth daily 30 minutes before breakfast.    traMADoL 25 mg tablet Take one tablet by mouth every 12 hours as needed.    vitamins, B complex tab Take one tablet by mouth daily.    vitamins, multiple cap Take one capsule by mouth daily.    zinc sulfate 220 mg (50 mg elemental zinc) capsule Take one capsule by mouth daily.     Vitals: 08/19/22 1012   BP: 120/74   BP Source: Arm, Right Upper   Pulse: 70   Temp: 36.7 ?C (98.1 ?F)   Resp: 16   SpO2: 98%   TempSrc: Oral   PainSc: Four   Weight: 72.3 kg (159 lb 6.4 oz)   Height: 157.5 cm (5' 2)     Body mass index is 29.15 kg/m?Marland Kitchen     Physical Exam  Vitals reviewed.   HENT:      Head: Normocephalic and atraumatic.   Pulmonary:      Effort: Pulmonary effort is normal.   Skin:     General: Skin is warm and dry.   Neurological:      Mental Status: She is alert and oriented to person, place, and time.   Psychiatric:  Mood and Affect: Mood normal.         Behavior: Behavior normal.         BP Readings from Last 5 Encounters:   08/19/22 120/74   08/10/22 122/70   08/06/22 (!) 131/90   08/05/22 137/89   08/02/22 138/88        Wt Readings from Last 5 Encounters:   08/19/22 72.3 kg (159 lb 6.4 oz)   08/10/22 73 kg (161 lb)   08/06/22 73 kg (161 lb)   08/05/22 73.1 kg (161 lb 3.2 oz)   08/02/22 72.6 kg (160 lb)        Health Maintenance   Topic Date Due    RSV VACCINE (60 YEARS AND OLDER) (1 - 1-dose 60+ series) Never done    COVID-19 VACCINE (3 - 2023-24 season) 06/23/2023 (Originally 01/22/2022)    MEDICARE ANNUAL WELLNESS VISIT  02/09/2023    CERVICAL CANCER SCREENING  10/03/2024    DTAP/TDAP VACCINES (3 - Td or Tdap) 11/21/2025    COLORECTAL CANCER SCREENING  10/15/2031    SHINGLES RECOMBINANT VACCINE  Completed    HIV SCREENING  Completed    HEPATITIS C SCREENING  Completed    INFLUENZA VACCINE  Completed    PNEUMOCOCCAL VACCINE 0-64 YRS  Aged Out               Assessment and Plan:    Problem   Memory Changes   Mixed Hyperlipidemia   Generalized Body Aches   Post-Surgical Hypothyroidism   Recurrent Major Depression in Full Remission (Hcc)    Was previously on lexapro which she tolerated well.     Migraine With Aura and Without Status Migrainosus, Not Intractable   Hiatal Hernia With Gastroesophageal Reflux    04/01/2022- Per OV Dr. Maisie Fus          Problem List Items Addressed This Visit CARDIAC AND VASCULATURE    Mixed hyperlipidemia     Follows with cardiology  Currently on Crestor 20 mg and Zetia 10 mg daily.   CK normal. Muscle cramps have improved.  -Continue current regimen.  Will continue to monitor            ENDOCRINE AND METABOLIC    Post-surgical hypothyroidism     Follows with endocrinology  -Recently decreased Levothyroxine to 100mg .  -Has repeat check TSH in 1 week.            GASTROINTESTINAL AND ABDOMINAL    Hiatal hernia with gastroesophageal reflux     On Omeprazole 40mg . Patient desires surgical intervention due to symptoms and desire to discontinue Omeprazole.  -Continue current management.  -Discuss surgical options with gastroenterologist at upcoming appointment.            MENTAL HEALTH    Recurrent major depression in full remission (HCC)     In remission  Currently duloxetine 20 mg daily to help with generalized pain, will gradually increase to 60 mg daily         Relevant Medications    duloxetine DR (CYMBALTA) 20 mg capsule       NEURO    Migraine with aura and without status migrainosus, not intractable     Increase in episodes of headaches, bilateral temporal headaches, described as pressure-like. No associated vision changes, weakness, numbness, or tingling. Recent normal CT head.  -Continue current management.  -Will consider repeat brain imaging if no improvement or change in symptoms  -Takes benadryl and sumatriptan as needed  Memory changes     States especially her short term memory  Ordered neuropsychological testing.  Will continue to monitor         Relevant Orders    AMB REFERRAL TO NEUROPSYCHOLOGY       SYMPTOMS AND SIGNS    Generalized body aches - Primary     On Duloxetine 20mg . No significant improvement noted.  -CRP WNL  -Increase Duloxetine to 40mg  daily for 2 weeks, then to 60mg  daily.  -Will consider rheumatology referral if no improvement with increased dose.            Other Visit Diagnoses       Need for shingles vaccine        Relevant Orders ZOSTER VACCINE RECOM, ADJUVANT SUSPENSION COMPONENT (VIAL 1 OF 2) (Completed)    ZOSTER VACCINE RECOMB, ADJ LYOPHILIZED COMPONENT (VIAL 2 OF 2) (Completed)             Patient Instructions   Routine Clinic Information:  Please don't hesitate to call if you have any problems or questions.     My nurse, Raquel, RN, can be reached at [phone].  If she does not answer, please leave a voicemail as she is probably rooming other patients. Please leave your name, the spelling of your last name, date of birth, phone number they can call you back and a description of why you are calling. You may also message [place] in MyChart.  Our fax number is [phone].    As a part of the CARES act, starting 08/23/2019, some results will be released to mychart automatically.  With these changes you may see your results before I do.  Critical lab results will be addressed immediately, but otherwise please  give me 72 hours to view and respond to your results before reaching out with questions.       Medication refills:  Please use the MyChart Refill request or contact your pharmacy directly to request medication refills.  Please allow at least 3 business days for refill requests.  Test results:  You will receive your test results at your appointment, by MyChart (our patient portal), or via phone or letter.   We prefer to use MyChart as much as possible.  If you are expecting results and have not heard from my office within 2 weeks of your testing, please send a MyChart message or call my office.    Lab work:  The main lab is on the 1st floor of the [facility].   Other lab locations are available; please see [email or web address]  Radiology is on the 2nd floor of the [address], among several other locations.  Please contact the radiology department to schedule at [phone].  Scheduling is available directly through MyChart, or via phone at [phone]. Same day, urgent care, and Telehealth appointments are available.    Appointment reminders may be received over the phone and by text.  Communication preferences can be managed in MyChart to ensure you receive important appointment notifications.  Support for many chronic illnesses is available through [employer]: [email or web address] or [phone].  We offer Integrated Behavioral Health services, nutrition support, and pharmacist support services free of charge.  To schedule an appointment with a specialist and/or testing please call the central scheduling number at [phone].     You may see my nurse practitioner, Olga Coaster, for urgent needs or if I am unavailable.  We are working as a team to provide better continuity and access to our patients.  If you ever have emergency symptoms of chest pain, shortness of breath or uncontrolled or unexplained pain, please go to your closest emergency room.  You can give [place] an update after you have addressed any emergency.        For urgent issues after business hours/weekends/holidays call [phone] and request for the outpatient internal medicine physician to be paged.     We offer same day appointments for your acute health concerns. These appointments are on a first come, first serve basis. Please call [phone] if you would like to make an appointment.          Return in about 3 months (around 11/19/2022) for Followup for 20 mins.

## 2022-08-19 NOTE — Assessment & Plan Note
Follows with cardiology  Currently on Crestor and Zetia.   CK normal. Muscle cramps have improved.  -Continue current regimen.  Will continue to monitor

## 2022-08-19 NOTE — Assessment & Plan Note
Increase in episodes of headaches, bilateral temporal headaches, described as pressure-like. No associated vision changes, weakness, numbness, or tingling. Recent normal CT head.  -Continue current management.  -Will consider repeat brain imaging if no improvement or change in symptoms  -Takes benadryl and sumatriptan as needed

## 2022-08-27 ENCOUNTER — Encounter: Admit: 2022-08-27 | Discharge: 2022-08-27 | Payer: MEDICARE

## 2022-09-01 ENCOUNTER — Ambulatory Visit: Admit: 2022-09-01 | Discharge: 2022-09-02 | Payer: MEDICARE

## 2022-09-01 ENCOUNTER — Encounter: Admit: 2022-09-01 | Discharge: 2022-09-01 | Payer: MEDICARE

## 2022-09-01 DIAGNOSIS — Z8249 Family history of ischemic heart disease and other diseases of the circulatory system: Secondary | ICD-10-CM

## 2022-09-01 DIAGNOSIS — E890 Postprocedural hypothyroidism: Secondary | ICD-10-CM

## 2022-09-01 DIAGNOSIS — M2550 Pain in unspecified joint: Secondary | ICD-10-CM

## 2022-09-01 DIAGNOSIS — IMO0002 Ulcer: Secondary | ICD-10-CM

## 2022-09-01 DIAGNOSIS — M5136 Other intervertebral disc degeneration, lumbar region: Secondary | ICD-10-CM

## 2022-09-01 DIAGNOSIS — M549 Dorsalgia, unspecified: Secondary | ICD-10-CM

## 2022-09-01 DIAGNOSIS — D539 Nutritional anemia, unspecified: Secondary | ICD-10-CM

## 2022-09-01 DIAGNOSIS — Z789 Other specified health status: Secondary | ICD-10-CM

## 2022-09-01 DIAGNOSIS — C50919 Malignant neoplasm of unspecified site of unspecified female breast: Secondary | ICD-10-CM

## 2022-09-01 DIAGNOSIS — F32A Depression: Secondary | ICD-10-CM

## 2022-09-01 DIAGNOSIS — Z9013 Acquired absence of bilateral breasts and nipples: Secondary | ICD-10-CM

## 2022-09-01 DIAGNOSIS — C73 Malignant neoplasm of thyroid gland: Secondary | ICD-10-CM

## 2022-09-01 DIAGNOSIS — M810 Age-related osteoporosis without current pathological fracture: Secondary | ICD-10-CM

## 2022-09-01 DIAGNOSIS — F419 Anxiety disorder, unspecified: Secondary | ICD-10-CM

## 2022-09-01 DIAGNOSIS — Z923 Personal history of irradiation: Secondary | ICD-10-CM

## 2022-09-01 DIAGNOSIS — E079 Disorder of thyroid, unspecified: Secondary | ICD-10-CM

## 2022-09-01 DIAGNOSIS — M5134 Other intervertebral disc degeneration, thoracic region: Secondary | ICD-10-CM

## 2022-09-01 DIAGNOSIS — S83209A Unspecified tear of unspecified meniscus, current injury, unspecified knee, initial encounter: Secondary | ICD-10-CM

## 2022-09-01 DIAGNOSIS — M4800 Spinal stenosis, site unspecified: Secondary | ICD-10-CM

## 2022-09-01 DIAGNOSIS — Z85850 Personal history of malignant neoplasm of thyroid: Secondary | ICD-10-CM

## 2022-09-01 DIAGNOSIS — C801 Malignant (primary) neoplasm, unspecified: Secondary | ICD-10-CM

## 2022-09-01 DIAGNOSIS — L578 Other skin changes due to chronic exposure to nonionizing radiation: Secondary | ICD-10-CM

## 2022-09-01 DIAGNOSIS — E039 Hypothyroidism, unspecified: Secondary | ICD-10-CM

## 2022-09-01 DIAGNOSIS — K219 Gastro-esophageal reflux disease without esophagitis: Secondary | ICD-10-CM

## 2022-09-01 DIAGNOSIS — L301 Dyshidrosis [pompholyx]: Secondary | ICD-10-CM

## 2022-09-01 DIAGNOSIS — M5030 Other cervical disc degeneration, unspecified cervical region: Secondary | ICD-10-CM

## 2022-09-01 DIAGNOSIS — R011 Cardiac murmur, unspecified: Secondary | ICD-10-CM

## 2022-09-01 DIAGNOSIS — R519 Generalized headaches: Secondary | ICD-10-CM

## 2022-09-01 DIAGNOSIS — W19XXXA Unspecified fall, initial encounter: Secondary | ICD-10-CM

## 2022-09-01 DIAGNOSIS — I10 Essential (primary) hypertension: Secondary | ICD-10-CM

## 2022-09-01 DIAGNOSIS — B999 Unspecified infectious disease: Secondary | ICD-10-CM

## 2022-09-01 DIAGNOSIS — E7800 Pure hypercholesterolemia, unspecified: Secondary | ICD-10-CM

## 2022-09-01 DIAGNOSIS — R32 Unspecified urinary incontinence: Secondary | ICD-10-CM

## 2022-09-01 NOTE — Progress Notes
Date of Service: 09/01/2022     Dermatology Problem List:   # Dyshidrotic Eczema  - Clobetasol ointment  # AK - cryo  # No personal history of skin cancer  # FH NMSC - mother  # History of Breast Cancer S/p Radiation and Chemotherapy     Subjective:             Cindy Randolph is a 62 y.o. female.    History of Present Illness  Return  PATIENT referred by Maisie Fus. Last seen Oct 2023, when the following was addressed    # Rash on hands  - Intermittent for years  - Currently flaring, with itching and redness  - Previously treated with Triamcinolone  - Current Treatment Clobetasol 0.05% ointment under occlusion. Now resolved and not needing    # Spot on L medial canthus  - Present 1 year  - Itchy and irritated  - resolved; patient forgot about it    Past Medical History: No personal history of skin cancer  Family History: Brother with BCC  Social History: Retired Engineer, civil (consulting)       Review of Systems   Constitutional:  Negative for appetite change, diaphoresis, fatigue, fever and unexpected weight change.   HENT:  Negative for congestion, mouth sores and sore throat.    Eyes:  Negative for pain, redness, itching and visual disturbance.   Respiratory:  Negative for cough and shortness of breath.    Cardiovascular:  Negative for palpitations and leg swelling.   Gastrointestinal:  Negative for abdominal pain, blood in stool, diarrhea, nausea and vomiting.   Genitourinary:  Negative for difficulty urinating and hematuria.   Musculoskeletal:  Negative for arthralgias and myalgias.   Neurological:  Negative for dizziness, seizures and facial asymmetry.   Hematological:  Does not bruise/bleed easily.   Psychiatric/Behavioral:  Negative for confusion and dysphoric mood. The patient is not nervous/anxious.          Objective:          acetaminophen (TYLENOL) 325 mg tablet Take two tablets by mouth every 6 hours. Take scheduled for 3 days after surgery, then as needed. Do not exceed 4,000mg  in a 24 hour period.    amLODIPine (NORVASC) 5 mg tablet Take one tablet by mouth daily.    aspirin 81 mg chewable tablet Chew one tablet by mouth daily.    biotin 1 mg cap Take one capsule by mouth daily.    CALCIUM PO Take 600 mg by mouth daily.    CHOLEcalciferoL (vitamin D3) 1,000 units tablet Take one tablet by mouth daily.    clobetasoL (TEMOVATE) 0.05 % topical ointment Apply  topically to affected area twice daily. Apply to areas of rash on hands twice daily; do NOT use on face/groin/underarms, use up to 2 weeks per month    diclofenac sodium (VOLTAREN) 1 % topical gel Apply four g topically to affected area daily as needed.    duloxetine DR (CYMBALTA) 20 mg capsule Take 40 mg daily for 2 weeks followed by 60 mg daily after    exemestane (AROMASIN) 25 mg tablet Take one tablet by mouth daily. Take after a meal.    ezetimibe (ZETIA) 10 mg tablet Take one tablet by mouth daily.    Fish,Bora,Flax Oils-OM3,6,9 #1 (TRIPLE OMEGA) 400-400-400 mg capsule Take one capsule by mouth daily.    metoprolol succinate XL (TOPROL XL) 25 mg extended release tablet Take one tablet by mouth at bedtime daily.    omeprazole DR (PRILOSEC)  40 mg capsule Take one capsule by mouth daily.    other medication K Complete K1 & K2 as MK-4 & MK-7 : Take 1 softgel by mouth once daily    rosuvastatin (CRESTOR) 20 mg tablet Take one tablet by mouth daily.    Selenium 100 mcg tab Take one tablet by mouth daily.    SYNTHROID 112 mcg tablet Take one tablet by mouth daily 30 minutes before breakfast.    traMADoL 25 mg tablet Take one tablet by mouth every 12 hours as needed.    vitamins, B complex tab Take one tablet by mouth daily.    vitamins, multiple cap Take one capsule by mouth daily.    zinc sulfate 220 mg (50 mg elemental zinc) capsule Take one capsule by mouth daily.     Vitals:    09/01/22 1058   PainSc: Four   Weight: 72.6 kg (160 lb)   Height: 157.5 cm (5' 2)     Body mass index is 29.26 kg/m?Marland Kitchen     Physical Exam    General: Alert, NAD   Eye: Normal conjunctivae  Psych: Normal mood    Areas Examined (all normal unless noted below):  Head/Face  Neck  hands    Declined FBSE today    Pertinent findings include:    Multiple brown and tan evenly pigmented macules are distributed over the examined areas    Scaly erythematous plaque on L medial canthus is not present on exam today    - minimal to no erythema of the hands          Assessment and Plan:    # Dyshidrotic Eczema, improved, at goal  - Discussed diagnosis, course of disease, prognosis, and treatment with patient at last visit  - Discussed possible triggers such as cleaning products or cooking at last visit  - Discussed vinyl glove use when cooking, cleaning, etc at last visit. May continue   - Counseled to wash hands with luke-warm water and a fragrance free soap followed by hand cream or Vaseline with every washing   - Recommended moisturizer under cotton gloves nightly for flares  - Discussed possible ACD component and switching to hypoallergenic products at last visit  - Continue (RX) Clobetasol 0.05% ointment to AA on hands 1-2 times daily followed by moisturizer, may use under occlusion at night, avoid use on face/groin/axillae; may use now as needed      # NUO A) L medial canthus - resolved  # Photodamage  - Ddx: Seb Derm > eczematous dermatitis > AK > NMSC  - Reviewed differential diagnosis and treatment options including biopsy for definitive diagnosis vs clinical monitoring, which we recommend at this time  - Photodocumentation completed and placed in the chart last visit  - Patient to monitor closely at home  - Patient to contact clinic if any changes noted, discussed that we would refer to Occuloplastics for biopsy if needed       RTC 6 months or sooner PRN  FBSE                        Zada Finders, MD MSc FAAD  Assistant Professor - Division of Dermatology  Department of Internal Medicine  [facility]

## 2022-09-01 NOTE — Patient Instructions
Thank you for choosing [facility]. I am happy that you are allowing me to participate in your care.     A summary of the recommendations made at today's visit is below:    General recommendations  Applying creams/ointments  If you were prescribed a topical medication, a small amount will suffice. A line of cream/ointment placed on your fingertip to the first joint (where the fingertip bends) should be sufficient to cover a surface area of the size of the palm of your hand. You do not need a thick layer the prescription cream/ointment.     Your rash may return after application of the prescription topical medicine. Repeat the course of treatment as discussed in your visit with me. Please let me know if you don't get enough relief, the rash spreads, or if new or worsening symptoms develop.    Gentle skin care for everyday  - Bathe with lukewarm water, 5-10 minutes. Dry thoroughly afterward by patting yourself with a towel. Avoid heavy rubbing  - Use a mild soap, such as Dove, Cetaphil, or any fragrance-free product  - Apply a moisturizer of your choice as much as you can (there's no limit like in prescription topical medications), but at least 2 times a day (even if no bath is taken) and after every bath.  After the bath, pat dry and apply the moisturizer right away. Use a thick moisturizer that says cream or ointment (not lotion that comes in a pump bottle - this is not as moisturizing, but does work). The stickier the better (like Vaseline or Aquaphor)! However, find one that you like and will use.  - For your face, you may use a gentle cleanser (such as CeraVe or Cetaphil just as examples) or plain water, followed by a non-comedogenic (meaning it won't clog pores) moisturizer with sunscreen SPF 30 or higher. A mineral sunscreen with zinc, titanium, or iron oxide are preferable      Sun protection is KEY to success in taking care of your skin!  Using sunscreens and sun avoidance will decrease your risk of developing skin cancers like melanoma, as well as prevent developing signs of aging like wrinkles, redness, and brown/dark spots.  - Avoid the sun at peak hours of the day (10 AM - 4 PM)   - Wear sunscreen SPF 30 or above. It should be reapplied every 2 hours if possible, or when you wipe off with a towel after swimming  - Use sunprotective clothing (shirts with long sleeves, pants, and wide-brimmed cowboy style hat). Baseball caps don't work well enough!    ** FOR MOISTURIZERS AND SUNSCREENS: I RECOMMEND ANY PRODUCT THAT YOU LIKE AND WILL USE CONSISTENTLY -- ANY BRAND/PRICE IS ACCEPTABLE **    Moles and other brown spots   - Common melanocytic nevi, or moles, tend to be less than 6 mm in diameter (size of a pencil eraser) and symmetric with even pigmentation, round or oval shape, regular outline, and sharp, non-fuzzy border. They can stick out from the skin.  - Flaky, seemingly stuck-on brown bumps are usually benign/non-cancerous keratoses, not moles. You will likely get more of these with time.  - Bright red smooth bumps that do not bleed are usually benign blood vessel lesions (cherry angiomas), not moles.  - It is normal to get new moles until the age of 24-59 years old.    Please contact me if you notice the following:  - A mole that differs from others (an ugly duckling that has  an odd shape, uneven or uncertain border, different colors), or one that changes, bleeds, or itches.  - Any growth that has one or more of the following features:  A - Asymmetry. (Concerning if spot is not symmetric--one half is unlike the other half)  B - Border. (Irregular, notched, or poorly defined border are concerning)  C - Color. (Multiple colors [shades of tan, brown or black, or is sometimes white, red, or blue] or changes in color are concerning)  D - Diameter. (Larger than 6mm, i.e., a pencil eraser)  E - Evolution. (An evolving or changing spot, which looks different from others on your body. If there's a new itch, tenderness, or bleeding develop, these are concerning changes too.  - An uneven, new, or large (greater than 3mm) brown or black streak underneath a fingernail or toenail, which may or may not spread onto the skin next to the nail  - A sore that just won't heal after about 2-4 weeks    Contact info  Routine - Please don't hesitate to let me know if you have additional questions or concerns before your next visit. I recommend directing your inquiries through MyChart. However, if you do not have MyChart access or prefer calling, the phone number is [phone], Option 4.   Urgent - For urgent, after-hours/weekend issues that are not emergencies, please call the [facility] Operator at [phone]. Ask to have paged the dermatologist on call. You will receive a call back from the dermatologist.  Emergency - Please call 911 or go to the nearest Emergency Department for all medical emergencies.    Follow up scheduling for visits greater than 6 months in the future  Your name will be put on the call back list if your recommended follow up time is greater than 6 months' time from your current appointment. The schedule opens only 6 months in advance, and a representative from [facility] will call you around that time to get you scheduled within the requested timeframe. It may be helpful for you to put a reminder in your calendar so you will remember to be on the look out for that call.    Procedure scheduling  All procedures need to be cleared through your insurance. This can take 2-3 weeks, or sometimes longer. If you do not hear back from [facility] within 1 month from the date that we discussed that you need a procedure, please send a MyChart message or call.    Drew A. Emge, MD MSc FAAD  Assistant Professor - Dermatology  Department of Internal Medicine  [facility]

## 2022-09-06 ENCOUNTER — Encounter: Admit: 2022-09-06 | Discharge: 2022-09-06 | Payer: MEDICARE

## 2022-09-06 DIAGNOSIS — M2550 Pain in unspecified joint: Secondary | ICD-10-CM

## 2022-09-06 DIAGNOSIS — E039 Hypothyroidism, unspecified: Secondary | ICD-10-CM

## 2022-09-06 DIAGNOSIS — C801 Malignant (primary) neoplasm, unspecified: Secondary | ICD-10-CM

## 2022-09-06 DIAGNOSIS — W19XXXA Unspecified fall, initial encounter: Secondary | ICD-10-CM

## 2022-09-06 DIAGNOSIS — C50112 Malignant neoplasm of central portion of left female breast: Secondary | ICD-10-CM

## 2022-09-06 DIAGNOSIS — M5134 Other intervertebral disc degeneration, thoracic region: Secondary | ICD-10-CM

## 2022-09-06 DIAGNOSIS — Z85850 Personal history of malignant neoplasm of thyroid: Secondary | ICD-10-CM

## 2022-09-06 DIAGNOSIS — R32 Unspecified urinary incontinence: Secondary | ICD-10-CM

## 2022-09-06 DIAGNOSIS — S83209A Unspecified tear of unspecified meniscus, current injury, unspecified knee, initial encounter: Secondary | ICD-10-CM

## 2022-09-06 DIAGNOSIS — M810 Age-related osteoporosis without current pathological fracture: Secondary | ICD-10-CM

## 2022-09-06 DIAGNOSIS — Z789 Other specified health status: Secondary | ICD-10-CM

## 2022-09-06 DIAGNOSIS — Z923 Personal history of irradiation: Secondary | ICD-10-CM

## 2022-09-06 DIAGNOSIS — R519 Generalized headaches: Secondary | ICD-10-CM

## 2022-09-06 DIAGNOSIS — E079 Disorder of thyroid, unspecified: Secondary | ICD-10-CM

## 2022-09-06 DIAGNOSIS — F419 Anxiety disorder, unspecified: Secondary | ICD-10-CM

## 2022-09-06 DIAGNOSIS — E890 Postprocedural hypothyroidism: Secondary | ICD-10-CM

## 2022-09-06 DIAGNOSIS — E7800 Pure hypercholesterolemia, unspecified: Secondary | ICD-10-CM

## 2022-09-06 DIAGNOSIS — I10 Essential (primary) hypertension: Secondary | ICD-10-CM

## 2022-09-06 DIAGNOSIS — Z9013 Acquired absence of bilateral breasts and nipples: Secondary | ICD-10-CM

## 2022-09-06 DIAGNOSIS — F3289 Other specified depressive episodes: Secondary | ICD-10-CM

## 2022-09-06 DIAGNOSIS — C73 Malignant neoplasm of thyroid gland: Secondary | ICD-10-CM

## 2022-09-06 DIAGNOSIS — K219 Gastro-esophageal reflux disease without esophagitis: Secondary | ICD-10-CM

## 2022-09-06 DIAGNOSIS — E785 Hyperlipidemia, unspecified: Secondary | ICD-10-CM

## 2022-09-06 DIAGNOSIS — M5030 Other cervical disc degeneration, unspecified cervical region: Secondary | ICD-10-CM

## 2022-09-06 DIAGNOSIS — D539 Nutritional anemia, unspecified: Secondary | ICD-10-CM

## 2022-09-06 DIAGNOSIS — F32A Depression: Secondary | ICD-10-CM

## 2022-09-06 DIAGNOSIS — C50919 Malignant neoplasm of unspecified site of unspecified female breast: Secondary | ICD-10-CM

## 2022-09-06 DIAGNOSIS — IMO0002 Ulcer: Secondary | ICD-10-CM

## 2022-09-06 DIAGNOSIS — M4800 Spinal stenosis, site unspecified: Secondary | ICD-10-CM

## 2022-09-06 DIAGNOSIS — M5136 Other intervertebral disc degeneration, lumbar region: Secondary | ICD-10-CM

## 2022-09-06 DIAGNOSIS — Z79811 Long term (current) use of aromatase inhibitors: Secondary | ICD-10-CM

## 2022-09-06 DIAGNOSIS — Z8249 Family history of ischemic heart disease and other diseases of the circulatory system: Secondary | ICD-10-CM

## 2022-09-06 DIAGNOSIS — B999 Unspecified infectious disease: Secondary | ICD-10-CM

## 2022-09-06 DIAGNOSIS — M549 Dorsalgia, unspecified: Secondary | ICD-10-CM

## 2022-09-06 DIAGNOSIS — R011 Cardiac murmur, unspecified: Secondary | ICD-10-CM

## 2022-09-06 LAB — CBC AND DIFF
HEMOGLOBIN: 12 g/dL (ref 12.0–15.0)
MCH: 30 pg (ref 26–34)
MCV: 92 FL (ref 80–100)
PLATELET COUNT: 204 K/UL (ref 150–400)
RBC COUNT: 3.8 M/UL — ABNORMAL LOW (ref 4.0–5.0)
RDW: 13 % (ref 11–15)
WBC COUNT: 7.1 K/UL (ref 4.5–11.0)

## 2022-09-06 LAB — COMPREHENSIVE METABOLIC PANEL
ALBUMIN: 4.5 g/dL (ref 3.5–5.0)
ALK PHOSPHATASE: 39 U/L (ref 25–110)
ALT: 28 U/L (ref 7–56)
ANION GAP: 7 (ref 3–12)
AST: 23 U/L (ref 7–40)
CALCIUM: 9.2 mg/dL (ref 8.5–10.6)
CHLORIDE: 101 MMOL/L — ABNORMAL LOW (ref 98–110)
CO2: 27 MMOL/L (ref 21–30)
CREATININE: 0.6 mg/dL (ref 0.4–1.00)
EGFR: >60 mL/min (ref >60)
POTASSIUM: 3.9 MMOL/L (ref 3.5–5.1)
SODIUM: 135 MMOL/L — ABNORMAL LOW (ref 137–147)
TOTAL BILIRUBIN: 0.4 mg/dL — ABNORMAL HIGH (ref 0.3–1.2)
TOTAL PROTEIN: 6.9 g/dL (ref 6.0–8.0)

## 2022-09-06 LAB — PHOSPHORUS: PHOSPHORUS: 3.7 mg/dL — ABNORMAL LOW (ref 2.0–4.5)

## 2022-09-06 LAB — FREE T4-FREE THYROXINE: FREE T4: 1.2 ng/dL (ref 0.6–1.6)

## 2022-09-06 LAB — MAGNESIUM: MAGNESIUM: 2 mg/dL (ref 1.6–2.6)

## 2022-09-06 LAB — TSH WITH FREE T4 REFLEX: TSH: 0.2 uU/mL — ABNORMAL LOW (ref 0.35–5.00)

## 2022-09-06 MED ORDER — SODIUM CHLORIDE 0.9 % IV SOLP
500 mL | Freq: Once | INTRAVENOUS | 0 refills | Status: CP
Start: 2022-09-06 — End: ?
  Administered 2022-09-06: 18:00:00 500 mL via INTRAVENOUS

## 2022-09-06 MED ORDER — ZOLEDRONIC ACID IVPB
3.5 mg | Freq: Once | INTRAVENOUS | 0 refills | Status: CP
Start: 2022-09-06 — End: ?
  Administered 2022-09-06 (×2): 3.5 mg via INTRAVENOUS

## 2022-09-06 NOTE — Progress Notes
Patient presented to clinic for Zometa and IV fluids. 20G Peripheral IV flushed with positive blood return. Patient tolerated infusions well. Peripheral IV flushed with saline with positive blood return and removed. Patient discharged off unit in stable condition.

## 2022-09-08 ENCOUNTER — Encounter: Admit: 2022-09-08 | Discharge: 2022-09-08 | Payer: MEDICARE

## 2022-09-09 ENCOUNTER — Encounter: Admit: 2022-09-09 | Discharge: 2022-09-09 | Payer: MEDICARE

## 2022-09-12 ENCOUNTER — Encounter: Admit: 2022-09-12 | Discharge: 2022-09-12 | Payer: MEDICARE

## 2022-09-13 ENCOUNTER — Encounter: Admit: 2022-09-13 | Discharge: 2022-09-13 | Payer: MEDICARE

## 2022-09-13 MED FILL — ROSUVASTATIN 20 MG PO TAB: 20 mg | ORAL | 30 days supply | Qty: 30 | Fill #2 | Status: CP

## 2022-09-14 ENCOUNTER — Encounter: Admit: 2022-09-14 | Discharge: 2022-09-14 | Payer: MEDICARE

## 2022-09-16 ENCOUNTER — Encounter: Admit: 2022-09-16 | Discharge: 2022-09-16 | Payer: MEDICARE

## 2022-09-16 DIAGNOSIS — F3342 Major depressive disorder, recurrent, in full remission: Secondary | ICD-10-CM

## 2022-09-16 MED ORDER — DULOXETINE 20 MG PO CPDR
60 mg | ORAL_CAPSULE | Freq: Every day | ORAL | 0 refills | 60.00000 days | Status: AC
Start: 2022-09-16 — End: ?

## 2022-09-16 NOTE — Telephone Encounter
Psychiatry: Antidepressants - SNRIs & Bupropion Passed04/25/2024 02:28 PM   Protocol Details Depression Screening completed within the last 6 months    Valid encounter within last 6 months    BP completed in the last 6 months

## 2022-09-17 ENCOUNTER — Encounter: Admit: 2022-09-17 | Discharge: 2022-09-17 | Payer: MEDICARE

## 2022-09-24 ENCOUNTER — Encounter: Admit: 2022-09-24 | Discharge: 2022-09-24 | Payer: MEDICARE

## 2022-09-24 MED ORDER — METOPROLOL SUCCINATE 25 MG PO TB24
25 mg | ORAL_TABLET | Freq: Every evening | ORAL | 3 refills | 90.00000 days | Status: AC
Start: 2022-09-24 — End: ?

## 2022-09-24 MED ORDER — SYNTHROID 100 MCG PO TAB
100 ug | ORAL_TABLET | Freq: Every day | ORAL | 3 refills | 30.00000 days | Status: AC
Start: 2022-09-24 — End: ?

## 2022-09-27 ENCOUNTER — Encounter: Admit: 2022-09-27 | Discharge: 2022-09-27 | Payer: MEDICARE

## 2022-09-28 ENCOUNTER — Encounter: Admit: 2022-09-28 | Discharge: 2022-09-28 | Payer: MEDICARE

## 2022-09-28 MED ORDER — ROSUVASTATIN 10 MG PO TAB
10 mg | ORAL_TABLET | Freq: Every day | ORAL | 3 refills | 90.00000 days | Status: AC
Start: 2022-09-28 — End: ?

## 2022-09-29 ENCOUNTER — Ambulatory Visit: Admit: 2022-09-29 | Discharge: 2022-09-30 | Payer: MEDICARE

## 2022-09-29 ENCOUNTER — Encounter: Admit: 2022-09-29 | Discharge: 2022-09-29 | Payer: MEDICARE

## 2022-09-29 DIAGNOSIS — M5030 Other cervical disc degeneration, unspecified cervical region: Secondary | ICD-10-CM

## 2022-09-29 DIAGNOSIS — R32 Unspecified urinary incontinence: Secondary | ICD-10-CM

## 2022-09-29 DIAGNOSIS — Z9013 Acquired absence of bilateral breasts and nipples: Secondary | ICD-10-CM

## 2022-09-29 DIAGNOSIS — IMO0002 Ulcer: Secondary | ICD-10-CM

## 2022-09-29 DIAGNOSIS — G4700 Insomnia, unspecified: Secondary | ICD-10-CM

## 2022-09-29 DIAGNOSIS — I10 Essential (primary) hypertension: Secondary | ICD-10-CM

## 2022-09-29 DIAGNOSIS — R011 Cardiac murmur, unspecified: Secondary | ICD-10-CM

## 2022-09-29 DIAGNOSIS — M5134 Other intervertebral disc degeneration, thoracic region: Secondary | ICD-10-CM

## 2022-09-29 DIAGNOSIS — E7800 Pure hypercholesterolemia, unspecified: Secondary | ICD-10-CM

## 2022-09-29 DIAGNOSIS — B999 Unspecified infectious disease: Secondary | ICD-10-CM

## 2022-09-29 DIAGNOSIS — M549 Dorsalgia, unspecified: Secondary | ICD-10-CM

## 2022-09-29 DIAGNOSIS — F32A Depression: Secondary | ICD-10-CM

## 2022-09-29 DIAGNOSIS — E782 Mixed hyperlipidemia: Secondary | ICD-10-CM

## 2022-09-29 DIAGNOSIS — E039 Hypothyroidism, unspecified: Secondary | ICD-10-CM

## 2022-09-29 DIAGNOSIS — F419 Anxiety disorder, unspecified: Secondary | ICD-10-CM

## 2022-09-29 DIAGNOSIS — Z85850 Personal history of malignant neoplasm of thyroid: Secondary | ICD-10-CM

## 2022-09-29 DIAGNOSIS — K219 Gastro-esophageal reflux disease without esophagitis: Secondary | ICD-10-CM

## 2022-09-29 DIAGNOSIS — M2550 Pain in unspecified joint: Secondary | ICD-10-CM

## 2022-09-29 DIAGNOSIS — C73 Malignant neoplasm of thyroid gland: Secondary | ICD-10-CM

## 2022-09-29 DIAGNOSIS — M5136 Other intervertebral disc degeneration, lumbar region: Secondary | ICD-10-CM

## 2022-09-29 DIAGNOSIS — W19XXXA Unspecified fall, initial encounter: Secondary | ICD-10-CM

## 2022-09-29 DIAGNOSIS — D539 Nutritional anemia, unspecified: Secondary | ICD-10-CM

## 2022-09-29 DIAGNOSIS — Z8249 Family history of ischemic heart disease and other diseases of the circulatory system: Secondary | ICD-10-CM

## 2022-09-29 DIAGNOSIS — M810 Age-related osteoporosis without current pathological fracture: Secondary | ICD-10-CM

## 2022-09-29 DIAGNOSIS — M4800 Spinal stenosis, site unspecified: Secondary | ICD-10-CM

## 2022-09-29 DIAGNOSIS — E890 Postprocedural hypothyroidism: Secondary | ICD-10-CM

## 2022-09-29 DIAGNOSIS — E785 Hyperlipidemia, unspecified: Secondary | ICD-10-CM

## 2022-09-29 DIAGNOSIS — C801 Malignant (primary) neoplasm, unspecified: Secondary | ICD-10-CM

## 2022-09-29 DIAGNOSIS — Z789 Other specified health status: Secondary | ICD-10-CM

## 2022-09-29 DIAGNOSIS — Z923 Personal history of irradiation: Secondary | ICD-10-CM

## 2022-09-29 DIAGNOSIS — S83209A Unspecified tear of unspecified meniscus, current injury, unspecified knee, initial encounter: Secondary | ICD-10-CM

## 2022-09-29 DIAGNOSIS — R519 Generalized headaches: Secondary | ICD-10-CM

## 2022-09-29 DIAGNOSIS — F3289 Other specified depressive episodes: Secondary | ICD-10-CM

## 2022-09-29 DIAGNOSIS — E079 Disorder of thyroid, unspecified: Secondary | ICD-10-CM

## 2022-09-29 DIAGNOSIS — C50919 Malignant neoplasm of unspecified site of unspecified female breast: Secondary | ICD-10-CM

## 2022-09-29 MED ORDER — GABAPENTIN 100 MG PO CAP
100 mg | ORAL_CAPSULE | Freq: Every evening | ORAL | 0 refills | Status: AC
Start: 2022-09-29 — End: ?

## 2022-09-29 NOTE — Assessment & Plan Note
Follows with endocrinology  -Recently decreased Levothyroxine to 100mg  daily with 50 mg once a week.

## 2022-09-29 NOTE — Assessment & Plan Note
Follows with cardiology  Currently on Crestor 10 mg and Zetia 10 mg daily.   -Continue current regimen.

## 2022-09-29 NOTE — Progress Notes
09/29/2022     Subjective:       Patient Reported Other  What topic(s) would you like to cover during your appointment?:  Arm, hand, leg, joint pain. Switch back to Lexapro as Cymbalta not helping mood or pain. The side effects of several meds overlap so it?s hard to know what to change. I?d like to decrease Omeprazole to 20 mg to see if it remains effective for GERD.  Please describe the issue(s) and history with the issue (location, severity, duration, symptoms, etc.).:  Complex  What has been done so far to take care of the issue(s)?:  Complex. Most recent: March 2024 pause Aromasin. May 8 decrease Crestor to 10 mg  What are your goals for this visit?:  See what Dr. Jacqulynn Cadet can think of to improve my pain and quality of life    Cindy Randolph is a 62 y.o. female.  Pain, Depression, and Heartburn     Has history of left breast cancer s/p mastectomy in 2022, cervical spine radiculopathy, lumbar degenerative disease, osteoporosis, migraine and depression.     Was seen by oncology on 09/08/2022 due to history of breast cancer.  Plan was at least 5 years endocrine therapy.  She did not tolerate letrozole (joint arthralgia). Changed to Anastrozole 01/11/22, did not tolerate due to joint arthralgia. Switched to [facility] 06/16/22. [name] stopped her AI 09/04/22 and will take a 2 week drug holiday and restart her joint supplements. Plans to message the clinic after two weeks to discuss further treatment recommendations.     The patient, with a history of aromatase inhibitor therapy, reports a significant increase in generalized pain over the past year. The pain, described as a burning and sharp sensation, is present in the muscles and bones, particularly in the arms, legs, and hands. The patient also reports new-onset foot pain and joint pain in the hands. The pain has reportedly worsened over the past four to five weeks following a Zometa infusion.    The patient has trialed three different aromatase inhibitors, all of which have been associated with increased pain. The most recent medication, Aromasin, was discontinued a few weeks prior to the consultation due to intolerable pain. The patient is also on a statin (Crestor), which was recently reduced from 20mg  to 10mg , and a high dose of omeprazole for GERD, which the patient wishes to reduce due to concerns about potential myalgia.    The patient has been adhering to a mostly vegan diet, with occasional meat and fish consumption. However, due to the pain, the patient has been struggling with maintaining regular exercise. The patient also reports poor sleep, waking up three to four times a night, and has been on Lexapro for mood stabilization.    The patient has been in physical therapy twice a week and has been seeing a therapist for the past three years. The patient also reports a point of tenderness in the armpit, which was noticed a day or two prior to the consultation. The patient has a history of a lymph node biopsy on the right side, but this new tenderness is on the left side.    The patient has been on thyroid medication, specifically daily except for one day a week when the dose is reduced to . This regimen has been followed for the past two weeks. The patient also reports cognitive improvement since discontinuing Aromasin.               Objective:  acetaminophen (TYLENOL) 325 mg tablet Take two tablets by mouth every 6 hours. Take scheduled for 3 days after surgery, then as needed. Do not exceed 4,000mg  in a 24 hour period.    amLODIPine (NORVASC) 5 mg tablet Take one tablet by mouth daily.    aspirin 81 mg chewable tablet Chew one tablet by mouth daily.    biotin 1 mg cap Take one capsule by mouth daily.    CALCIUM PO Take 600 mg by mouth daily.    CHOLEcalciferoL (vitamin D3) 1,000 units tablet Take one tablet by mouth daily. Takes 5000 units    clobetasoL (TEMOVATE) 0.05 % topical ointment Apply  topically to affected area twice daily. Apply to areas of rash on hands twice daily; do NOT use on face/groin/underarms, use up to 2 weeks per month    diclofenac sodium (VOLTAREN) 1 % topical gel Apply four g topically to affected area daily as needed.    escitalopram oxalate (LEXAPRO) 20 mg tablet Take one-half tablet by mouth daily.    exemestane (AROMASIN) 25 mg tablet Take one tablet by mouth daily. Take after a meal.    ezetimibe (ZETIA) 10 mg tablet Take one tablet by mouth daily.    Fish,Bora,Flax Oils-OM3,6,9 #1 (TRIPLE OMEGA) 400-400-400 mg capsule Take one capsule by mouth daily.    gabapentin (NEURONTIN) 100 mg capsule Take one capsule by mouth at bedtime daily.    metoprolol succinate XL (TOPROL XL) 25 mg extended release tablet Take one tablet by mouth at bedtime daily.    omeprazole DR (PRILOSEC) 40 mg capsule Take one capsule by mouth daily.    other medication K Complete K1 & K2 as MK-4 & MK-7 : Take 1 softgel by mouth once daily    rosuvastatin (CRESTOR) 10 mg tablet Take one tablet by mouth daily.    Selenium 100 mcg tab Take one tablet by mouth daily.    SYNTHROID 100 mcg tablet Take one tablet by mouth daily 30 minutes before breakfast.    traMADoL 25 mg tablet Take one tablet by mouth every 12 hours as needed.    vitamins, B complex tab Take one tablet by mouth daily.    vitamins, multiple cap Take one capsule by mouth daily.    zinc sulfate 220 mg (50 mg elemental zinc) capsule Take one capsule by mouth daily.     Vitals:    09/29/22 1058   BP: 120/82   BP Source: Arm, Right Upper   Pulse: 72   Temp: 36.3 ?C (97.4 ?F)   Resp: 18   SpO2: 97%   TempSrc: Oral   PainSc: Eight   Weight: 73.7 kg (162 lb 6.4 oz)   Height: (P) 157.5 cm (5' 2)     Body mass index is 29.7 kg/m? (pended).     Physical Exam  Vitals reviewed.   HENT:      Head: Normocephalic and atraumatic.   Cardiovascular:      Comments: No lymph node noted in left axilla. Swelling likely due to fibrosis from mastectomy.  Pulmonary:      Effort: Pulmonary effort is normal.   Skin: General: Skin is warm and dry.   Neurological:      Mental Status: She is alert and oriented to person, place, and time.   Psychiatric:         Mood and Affect: Mood normal.         Behavior: Behavior normal.         BP Readings  from Last 5 Encounters:   09/29/22 120/82   09/06/22 121/78   08/19/22 120/74   08/10/22 122/70   08/06/22 (!) 131/90        Wt Readings from Last 5 Encounters:   09/29/22 73.7 kg (162 lb 6.4 oz)   09/06/22 74.2 kg (163 lb 9.6 oz)   09/01/22 72.6 kg (160 lb)   08/19/22 72.3 kg (159 lb 6.4 oz)   08/10/22 73 kg (161 lb)        Health Maintenance   Topic Date Due    RSV VACCINE (60 YEARS AND OLDER) (1 - 1-dose 60+ series) Never done    COVID-19 VACCINE (3 - 2023-24 season) 06/23/2023 (Originally 01/22/2022)    MEDICARE ANNUAL WELLNESS VISIT  02/09/2023    CERVICAL CANCER SCREENING  10/03/2024    DTAP/TDAP VACCINES (3 - Td or Tdap) 11/21/2025    COLORECTAL CANCER SCREENING  10/15/2031    SHINGLES RECOMBINANT VACCINE  Completed    HIV SCREENING  Completed    HEPATITIS C SCREENING  Completed    INFLUENZA VACCINE  Completed    HPV VACCINES  Aged Out    PNEUMOCOCCAL VACCINE 0-64 YRS  Aged Out    BREAST CANCER SCREENING  Discontinued               Assessment and Plan:    Problem   Insomnia   Mixed Hyperlipidemia   Generalized Body Aches   Post-Surgical Hypothyroidism       Problem List Items Addressed This Visit          CARDIAC AND VASCULATURE    Mixed hyperlipidemia     Follows with cardiology  Currently on Crestor 10 mg and Zetia 10 mg daily.   -Continue current regimen.         Relevant Orders    LIPID PROFILE       ENDOCRINE AND METABOLIC    Post-surgical hypothyroidism     Follows with endocrinology  -Recently decreased Levothyroxine to 100mg  daily except for 50 mg once a week.            SLEEP    Insomnia     Patient reports difficulty staying asleep, waking up three to four times a night.  -Continue lifestyle modifications including avoiding caffeine after noon, avoiding electronic devices before bed, and keeping the house cooler at night.  -Consider the potential benefit of Gabapentin on sleep.            SYMPTOMS AND SIGNS    Generalized body aches - Primary     Worsening pain described as burning, sharp, and all over, possibly related to aromatase inhibitor therapy and Zometa infusion. Pain is affecting sleep and quality of life.  -Started Gabapentin 100mg  at bedtime, with potential to increase to 1 tablet three times a day as needed.  -Check CPK to assess for muscle inflammation or damage.  -Will consider rheumatology referral and workup if no improvement with increased dose.         Relevant Medications    gabapentin (NEURONTIN) 100 mg capsule    Other Relevant Orders    CREATINE KINASE-CPK    COMPREHENSIVE METABOLIC PANEL        There are no Patient Instructions on file for this visit.     Has followup scheduled in 2 months

## 2022-09-29 NOTE — Assessment & Plan Note
Patient reports difficulty staying asleep, waking up three to four times a night.  -Continue lifestyle modifications including avoiding caffeine after noon, avoiding electronic devices before bed, and keeping the house cooler at night.  -Consider the potential benefit of Gabapentin on sleep.

## 2022-09-29 NOTE — Progress Notes
Answers submitted by the patient for this visit:  Other Symptom Questionnaire (Submitted on 09/29/2022)  Chief Complaint: Patient Reported Other  What topic(s) would you like to cover during your appointment?: Arm, hand, leg, joint pain. Switch back to Lexapro as Cymbalta not helping mood or pain. The side effects of several meds overlap so it?s hard to know what to change. I?d like to decrease Omeprazole to 20 mg to see if it remains effective for GERD.  Please describe the issue(s) and history with the issue (location, severity, duration, symptoms, etc.). : Complex  What has been done so far to take care of the issue(s)?: Complex. Most recent: March 2024 pause Aromasin. May 8 decrease Crestor to 10 mg  What are your goals for this visit?: See what Dr. Jacqulynn Cadet can think of to improve my pain and quality of life

## 2022-09-29 NOTE — Assessment & Plan Note
Worsening pain described as burning, sharp, and all over, possibly related to aromatase inhibitor therapy and Zometa infusion. Pain is affecting sleep and quality of life.  -Started Gabapentin 100mg  at bedtime, with potential to increase to 1 tablet three times a day as needed.  -Check CPK to assess for muscle inflammation or damage.  -Will consider rheumatology referral and workup if no improvement with increased dose.

## 2022-09-30 ENCOUNTER — Encounter: Admit: 2022-09-30 | Discharge: 2022-09-30 | Payer: MEDICARE

## 2022-09-30 ENCOUNTER — Ambulatory Visit: Admit: 2022-09-30 | Discharge: 2022-09-30 | Payer: MEDICARE

## 2022-09-30 DIAGNOSIS — I25119 Atherosclerotic heart disease of native coronary artery with unspecified angina pectoris: Secondary | ICD-10-CM

## 2022-09-30 DIAGNOSIS — R52 Pain, unspecified: Secondary | ICD-10-CM

## 2022-09-30 DIAGNOSIS — R931 Abnormal findings on diagnostic imaging of heart and coronary circulation: Secondary | ICD-10-CM

## 2022-09-30 DIAGNOSIS — I10 Essential (primary) hypertension: Secondary | ICD-10-CM

## 2022-09-30 LAB — LIPID PROFILE
CHOLESTEROL: 112 mg/dL (ref <200)
HDL: 53 mg/dL (ref >40)
LDL: 48 mg/dL (ref <100)
NON HDL CHOLESTEROL: 59 mg/dL
TRIGLYCERIDES: 128 mg/dL (ref <150)
VLDL: 26 mg/dL

## 2022-09-30 LAB — COMPREHENSIVE METABOLIC PANEL
ALBUMIN: 4.6 g/dL (ref 3.5–5.0)
ALK PHOSPHATASE: 45 U/L (ref 25–110)
ALT: 34 U/L (ref 7–56)
ANION GAP: 10 (ref 3–12)
AST: 29 U/L (ref 7–40)
BLD UREA NITROGEN: 10 mg/dL (ref 7–25)
CALCIUM: 9.2 mg/dL (ref 8.5–10.6)
CHLORIDE: 104 MMOL/L (ref 98–110)
CO2: 26 MMOL/L (ref 21–30)
CREATININE: 0.6 mg/dL (ref 0.4–1.00)
EGFR: >60 mL/min (ref >60)
GLUCOSE,PANEL: 98 mg/dL (ref 70–100)
POTASSIUM: 4.2 MMOL/L (ref 3.5–5.1)
SODIUM: 140 MMOL/L (ref 137–147)
TOTAL BILIRUBIN: 0.4 mg/dL (ref 0.3–1.2)
TOTAL PROTEIN: 7.3 g/dL (ref 6.0–8.0)

## 2022-09-30 LAB — CREATINE KINASE-CPK: CK TOTAL: 220 U/L — ABNORMAL HIGH (ref 21–215)

## 2022-10-14 ENCOUNTER — Encounter: Admit: 2022-10-14 | Discharge: 2022-10-14 | Payer: MEDICARE

## 2022-11-01 ENCOUNTER — Encounter: Admit: 2022-11-01 | Discharge: 2022-11-01 | Payer: MEDICARE

## 2022-11-01 ENCOUNTER — Ambulatory Visit: Admit: 2022-11-01 | Discharge: 2022-11-01 | Payer: MEDICARE

## 2022-11-01 ENCOUNTER — Ambulatory Visit: Admit: 2022-11-01 | Discharge: 2022-11-02 | Payer: MEDICARE

## 2022-11-01 DIAGNOSIS — I10 Essential (primary) hypertension: Secondary | ICD-10-CM

## 2022-11-01 DIAGNOSIS — M2550 Pain in unspecified joint: Secondary | ICD-10-CM

## 2022-11-01 DIAGNOSIS — E039 Hypothyroidism, unspecified: Secondary | ICD-10-CM

## 2022-11-01 DIAGNOSIS — F419 Anxiety disorder, unspecified: Secondary | ICD-10-CM

## 2022-11-01 DIAGNOSIS — S83209A Unspecified tear of unspecified meniscus, current injury, unspecified knee, initial encounter: Secondary | ICD-10-CM

## 2022-11-01 DIAGNOSIS — B999 Unspecified infectious disease: Secondary | ICD-10-CM

## 2022-11-01 DIAGNOSIS — F3289 Other specified depressive episodes: Secondary | ICD-10-CM

## 2022-11-01 DIAGNOSIS — K219 Gastro-esophageal reflux disease without esophagitis: Secondary | ICD-10-CM

## 2022-11-01 DIAGNOSIS — M549 Dorsalgia, unspecified: Secondary | ICD-10-CM

## 2022-11-01 DIAGNOSIS — W19XXXA Unspecified fall, initial encounter: Secondary | ICD-10-CM

## 2022-11-01 DIAGNOSIS — M810 Age-related osteoporosis without current pathological fracture: Secondary | ICD-10-CM

## 2022-11-01 DIAGNOSIS — R011 Cardiac murmur, unspecified: Secondary | ICD-10-CM

## 2022-11-01 DIAGNOSIS — F32A Depression: Secondary | ICD-10-CM

## 2022-11-01 DIAGNOSIS — M5134 Other intervertebral disc degeneration, thoracic region: Secondary | ICD-10-CM

## 2022-11-01 DIAGNOSIS — Z789 Other specified health status: Secondary | ICD-10-CM

## 2022-11-01 DIAGNOSIS — M4800 Spinal stenosis, site unspecified: Secondary | ICD-10-CM

## 2022-11-01 DIAGNOSIS — K449 Diaphragmatic hernia without obstruction or gangrene: Secondary | ICD-10-CM

## 2022-11-01 DIAGNOSIS — E890 Postprocedural hypothyroidism: Secondary | ICD-10-CM

## 2022-11-01 DIAGNOSIS — E785 Hyperlipidemia, unspecified: Secondary | ICD-10-CM

## 2022-11-01 DIAGNOSIS — R32 Unspecified urinary incontinence: Secondary | ICD-10-CM

## 2022-11-01 DIAGNOSIS — R519 Generalized headaches: Secondary | ICD-10-CM

## 2022-11-01 DIAGNOSIS — E7800 Pure hypercholesterolemia, unspecified: Secondary | ICD-10-CM

## 2022-11-01 DIAGNOSIS — C73 Malignant neoplasm of thyroid gland: Secondary | ICD-10-CM

## 2022-11-01 DIAGNOSIS — Z85850 Personal history of malignant neoplasm of thyroid: Secondary | ICD-10-CM

## 2022-11-01 DIAGNOSIS — IMO0002 Ulcer: Secondary | ICD-10-CM

## 2022-11-01 DIAGNOSIS — M5030 Other cervical disc degeneration, unspecified cervical region: Secondary | ICD-10-CM

## 2022-11-01 DIAGNOSIS — Z923 Personal history of irradiation: Secondary | ICD-10-CM

## 2022-11-01 DIAGNOSIS — C50919 Malignant neoplasm of unspecified site of unspecified female breast: Secondary | ICD-10-CM

## 2022-11-01 DIAGNOSIS — Z8249 Family history of ischemic heart disease and other diseases of the circulatory system: Secondary | ICD-10-CM

## 2022-11-01 DIAGNOSIS — D539 Nutritional anemia, unspecified: Secondary | ICD-10-CM

## 2022-11-01 DIAGNOSIS — C801 Malignant (primary) neoplasm, unspecified: Secondary | ICD-10-CM

## 2022-11-01 DIAGNOSIS — E079 Disorder of thyroid, unspecified: Secondary | ICD-10-CM

## 2022-11-01 DIAGNOSIS — Z9013 Acquired absence of bilateral breasts and nipples: Secondary | ICD-10-CM

## 2022-11-01 DIAGNOSIS — M5136 Other intervertebral disc degeneration, lumbar region: Secondary | ICD-10-CM

## 2022-11-01 NOTE — Progress Notes
Gastroenterology Clinic Note    Patient Name:Cindy Randolph   ZOX:[id number]  Date of Service: 11/01/2022   Referring: Erskine Emery, MD   Date of service: 11/01/2022   Reason for referral: GERD    History of present illness:      Cindy Randolph is an 62 y.o. female who is referred for GERD.    The patient, a nurse with a history of arthritis, breast cancer, and GERD, presents with a complex symptomatology. They report a significant worsening of GERD symptoms following a fall in October of the previous year, during which they sustained a latissimus dorsi tear. The patient describes experiencing severe cramping and a sensation akin to vomiting. An endoscopy revealed a hiatal hernia, for which they were prescribed 40mg  of omeprazole.    However, the patient developed severe myalgia in their arms and joint pain in their hands and feet following a Zometa infusion approximately a month ago. They were also taking several other medications at the time, which may have contributed to these symptoms. As a result, they reduced their intake of omeprazole and a statin, with the approval of their cardiologist. They also took a drug holiday from [place]. The arm pain has since resolved, but the hand pain persists, albeit less severely.    The patient reports a resurgence of GERD symptoms after eating certain foods and has taken 20mg  of omeprazole a few times in the past week. They express a desire to avoid long-term use of omeprazole and are considering surgical options for the hiatal hernia.    In addition to the above, the patient has experienced difficulty swallowing, particularly after radiation treatment for esophagitis. They report that this symptom has improved since an endoscopy and stretching procedure. They deny any current abdominal pain.    The patient's breast cancer is currently in remission, but they acknowledge the potential for lingering cells. They have chosen to discontinue endocrine therapy due to severe pain. They have a history of palpitations and were hospitalized for severe chest pain, which was initially feared to be a heart attack. However, a cardiac catheterization revealed coronary artery disease that was not severe enough to account for the pain. The patient suspects that the pain may have been due to GERD.    The patient has had a colonoscopy within the last year, which showed no abnormalities. They deny any family history of gastrointestinal or colorectal cancers.    Past Medical History:   Diagnosis Date    Accidental fall 02/23/22    Knee gave out and fell down stairs    Acquired hypothyroidism     Anxiety 2016    from pain    Back pain     Breast cancer in female [facility]) 12/2020    Left    Cancer of thyroid (HCC) 1980    Degenerative disc disease, cervical 1994    Degenerative disc disease, lumbar 2010    Degenerative disc disease, thoracic 2010    Depression situational, transient    after divorce, after death of 2nd husband    Depressive disorder, not elsewhere classified 7/23    Now on Lexapro; it is effective    Essential hypertension began in 3rd trimester most pregnancies    remained after last pregnancy    Essential hypertension, benign as above    Family history of coronary artery disease in brother 06/17/2022    04/01/2022- Per OV Dr. Maisie Fus     Generalized headaches 1996-2010    probably hormonally related  GERD (gastroesophageal reflux disease)     H/O total thyroidectomy 05/24/1978    Heart murmur at birth    Dr. Maisie Fus said he didn't hear it a few years ago    High cholesterol     History of bilateral mastectomy 06/22/2022    History of external beam radiation therapy 07/15/2021    History of thyroid cancer 06/22/2022    TSH 0.39 and pt not taking med regularly until about 2 weeks ago.  Will have her repeat TSH in 3 weeks and f/u after.  Needs to be between 0.1 and 0.3.  Will refer her back to Endo for f/u.    Incontinence 3/23    With coughing and sneezing and strong sudden mivemwnts Infection     Joint pain 1994    Limb alert care status     No access on R-arm    Osteoporosis 03/2021    Bilat hips    Other and unspecified hyperlipidemia 11/23    Will see cardiologist early 2024    Other malignant neoplasm without specification of site thyroid, 1980    thyroidectomy    Spinal stenosis 2017 to present    per MRIs    Thyroid disorder as noted    Torn meniscus 12/25/2020    Ulcer 1987    with divorce; resolved    Unspecified deficiency anemia 2009    from excessive bleeding post-partum; treated iron       Family History   Problem Relation Name Age of Onset    Arthritis Paternal Grandmother Ted Mcalpine         multiple heberdens nodules and deformities    Back pain Paternal Grandmother Ted Mcalpine     Arthritis-osteo Paternal Grandmother Ted Mcalpine     Diabetes Paternal Grandmother Ted Mcalpine         Type 2 as older adult    Arthritis Mother Carmina Miller         wear and tear    Back pain Mother Carmina Miller     Hypertension Mother Carmina Miller     Joint Pain Mother Carmina Miller     Neck Pain Mother Carmina Miller     Cancer-Breast Mother Carmina Miller 64        was on HRT for 10-15 years    Cancer Mother Carmina Miller         Breast CA, post menopausal, estrogen-sensitive    Miscarriages Mother Carmina Miller         3-5 and a stillbirth    Basal Cell Carcinoma Brother Onalee Hua (Brother)     Back pain Brother Onalee Hua (Brother)     Hypertension Brother Onalee Hua (Brother)     Asthma Brother Onalee Hua (Brother)         Worst when a child    Basal Cell Carcinoma Brother Chip     Back pain Brother Chip         has had surgery    Hypertension Brother Chip     Basal Cell Carcinoma Brother Isaias Cowman     Back pain Brother Isaias Cowman     Hypertension Brother Isaias Cowman     Diabetes Father Alinda Dooms         Type 2    Heart problem Father Alinda Dooms         CABG 3    Heart Disease Father Alinda Dooms     Hypertension Father Alinda Dooms     Alcohol abuse Father Alinda Dooms  Diabetes Maternal Grandfather Elam Dutch         Type 2 as older adult    Birth Defect Daughter Rhoderick Moody         PRS, congenital diaphragmatic hernia, malrotation, grey matter heterotopia    Stroke Maternal Uncle Jillyn Hidden         In his 59s    Thyroid Disease Maternal Uncle Jillyn Hidden     Diabetes Paternal Grandfather Grandpa     Birth Defect Nephew          craniosynostosis    Thyroid Disease Maternal Uncle Bill Breiner     Cancer-Breast Maternal Great-Aunt  70       Current Outpatient Medications   Medication Sig Dispense Refill    acetaminophen (TYLENOL) 325 mg tablet Take two tablets by mouth every 6 hours. Take scheduled for 3 days after surgery, then as needed. Do not exceed 4,000mg  in a 24 hour period. 40 tablet 0    amLODIPine (NORVASC) 5 mg tablet Take one tablet by mouth daily. 90 tablet 3    aspirin 81 mg chewable tablet Chew one tablet by mouth daily. 90 tablet 0    biotin 1 mg cap Take one capsule by mouth daily.      CALCIUM PO Take 600 mg by mouth daily.      CHOLEcalciferoL (vitamin D3) 1,000 units tablet Take one tablet by mouth daily. Takes 5000 units      clobetasoL (TEMOVATE) 0.05 % topical ointment Apply  topically to affected area twice daily. Apply to areas of rash on hands twice daily; do NOT use on face/groin/underarms, use up to 2 weeks per month 60 g 3    diclofenac sodium (VOLTAREN) 1 % topical gel Apply four g topically to affected area daily as needed.      escitalopram oxalate (LEXAPRO) 20 mg tablet Take one-half tablet by mouth daily.      exemestane (AROMASIN) 25 mg tablet Take one tablet by mouth daily. Take after a meal. 30 tablet 11    ezetimibe (ZETIA) 10 mg tablet Take one tablet by mouth daily. 90 tablet 3    Fish,Bora,Flax Oils-OM3,6,9 #1 (TRIPLE OMEGA) 400-400-400 mg capsule Take one capsule by mouth daily.      gabapentin (NEURONTIN) 100 mg capsule Take one capsule by mouth at bedtime daily. 90 capsule 0    metoprolol succinate XL (TOPROL XL) 25 mg extended release tablet Take one tablet by mouth at bedtime daily. 90 tablet 3    omeprazole DR (PRILOSEC) 40 mg capsule Take one capsule by mouth daily. 90 capsule 1    other medication K Complete K1 & K2 as MK-4 & MK-7 : Take 1 softgel by mouth once daily      rosuvastatin (CRESTOR) 10 mg tablet Take one tablet by mouth daily. 90 tablet 3    Selenium 100 mcg tab Take one tablet by mouth daily.      SYNTHROID 100 mcg tablet Take one tablet by mouth daily 30 minutes before breakfast. 90 tablet 3    traMADoL 25 mg tablet Take one tablet by mouth every 12 hours as needed. 30 tablet 0    vitamins, B complex tab Take one tablet by mouth daily.      vitamins, multiple cap Take one capsule by mouth daily.      zinc sulfate 220 mg (50 mg elemental zinc) capsule Take one capsule by mouth daily.       No current facility-administered medications for  this visit.       Allergies   Allergen Reactions    Mango ANAPHYLAXIS    Other [Unclassified Drug] ANAPHYLAXIS     DUCK Meat and Eggs     Imdur [Isosorbide Mononitrate] CHEST TIGHTNESS    Iodinated Contrast Media RASH     She developed rash on back and neck after hospitalization 06/2022 where she underwent heart cath. Unsure if contrast was cause of rash, but it developed shortly after procedure.    Oxycodone NAUSEA ONLY     Prefers tramadol    Sudafed [Pseudoephedrine Hcl] PALPITATIONS       Social History     Socioeconomic History    Marital status: Married    Number of children: 8   Occupational History    Occupation: Retired Engineer, civil (consulting)     Comment: Worked in Multimedia programmer; also worked in the ambulatory setting   Tobacco Use    Smoking status: Never     Passive exposure: Past    Smokeless tobacco: Never    Tobacco comments:     Significant second hand smoke from parents 4-104 years old   Vaping Use    Vaping status: Never Used   Substance and Sexual Activity    Alcohol use: Not Currently     Comment: Prior to 2020, socially, one drink once a week at most    Drug use: Never     Comment: in late teens, early twenties, a few times and not since    Sexual activity: Yes     Partners: Male     Birth control/protection: Post-menopausal, None       Review of Systems  Answers submitted by the patient for this visit:  Review Of Systems (Submitted on 11/01/2022)  Env Allergies: No  Food allergies: Yes  Eye itching: Yes  Floating items in the line of sight: Yes  Arthralgias: Yes  Back pain: Yes  Walking problem: Yes  Joint swelling: Yes  Myalgias: Yes  Neck pain: Yes  Neck stiffness: Yes  Agitation: No  Behavior problem: No  Confusion: No  Decreased concentration: Yes  Dysphoric mood: Yes  Hallucinations: No  Hyperactive: No  Nervous/anxious: Yes  Self-Injury: No  Sleep disturbance: Yes  Suicidal ideas: No  Dizziness: Yes  Facial asymmetry: No  Headaches: Yes  Light-headedness: Yes  Numbness: Yes  Seizures: No  Speech difficulty: No  Syncope: No  Tremors: No  Weakness: Yes  Chest Pain: No  Leg swelling: No  Palpitations: Yes  Difficulty urinating: No  Pain with sex/intercourse: Yes  Pain with urinating: No  Bedwetting: No  Pain on your side (Flank pain): Yes  Frequent uriniation: No  Genital sore: No  Blood in urine: No  Menstrual problem: No  Pelvic pain: Yes  Urgency: No  Urine decreased: No  Vaginal bleeding (not menstrual): No  Vaginal discharge: No  Vaginal pain: No  Short periods of not breathing (apnea): Yes  Chest tightness: No  Choking: No  Cough: Yes  Shortness of breath: Yes  High pitched wheezing (Stridor): No  Wheezing: No  Lower activity level: Yes  Decrease in appetite: No  Chills: No  Crying: Yes  Increase in sweating: Yes  More tired than usual: Yes  Fever: No  Unexpected weight change: No  Adenopathy: No  Bruises/bleeds easily: Yes  Color change: No  Pale skin: Yes  Rash: Yes  Wound: No  Cold intolerance: No  Heat intolerance: No  Increase in thirst: No  Increase  in hunger: No  Increase in urine output: No  Congestion: Yes  Dental Problem: No  Drooling: No  Ear discharge: No  Ear pain: No  Facial swelling: No  Hearing loss: No  Mouth sores: No  Nosebleeds: No  Postnasal drip: Yes  Runny nose: Yes  Sinus pain: Yes  Sinus pressure: Yes  Sneezing: Yes  Sore throat: No  Ringing in ears: Yes  Trouble swallowing: No  Voice change: No      Objective:     BP 121/82 (BP Source: Arm, Right Upper, Patient Position: Sitting)  - Pulse 74  - Temp 36.1 ?C (97 ?F)  - Resp 16  - Ht 160 cm (5' 3)  - Wt 74.8 kg (165 lb)  - SpO2 95%  - BMI 29.23 kg/m?     Physical Exam  Constitutional:       General: She is not in acute distress.     Appearance: Normal appearance. She is normal weight. She is not ill-appearing or toxic-appearing.   HENT:      Head: Normocephalic and atraumatic.      Right Ear: External ear normal.      Left Ear: External ear normal.      Nose: Nose normal.      Mouth/Throat:      Mouth: Mucous membranes are dry.   Eyes:      General: No scleral icterus.     Extraocular Movements: Extraocular movements intact.   Cardiovascular:      Rate and Rhythm: Normal rate and regular rhythm.      Pulses: Normal pulses.      Heart sounds: Normal heart sounds. No murmur heard.  Pulmonary:      Effort: Pulmonary effort is normal. No respiratory distress.      Breath sounds: Normal breath sounds. No wheezing.   Abdominal:      General: Bowel sounds are normal. There is no distension.      Palpations: Abdomen is soft.      Tenderness: There is no abdominal tenderness. There is no guarding.   Musculoskeletal:         General: Normal range of motion.      Cervical back: Normal range of motion. No rigidity.   Skin:     General: Skin is warm.   Neurological:      General: No focal deficit present.      Mental Status: She is alert and oriented to person, place, and time. Mental status is at baseline.   Psychiatric:         Mood and Affect: Mood normal.         Behavior: Behavior normal.         Thought Content: Thought content normal.           Laboratory:   Lab Results   Component Value Date    WBC 7.1 09/06/2022    HGB 12.0 09/06/2022    HCT 35.8 (L) 09/06/2022    MCV 92.2 09/06/2022     No components found for: SEDRATE  Lab Results   Component Value Date    CRP 0.31 08/10/2022     No components found for: RETICCTPCT  No results found for: IRON, TIBC, IRONSAT, FERRITINNo components found for: VITAMINB12No components found for: FOLATE  No results found for: HPYLORIAG  Lab Results   Component Value Date    NA 140 09/30/2022    K 4.2 09/30/2022    CO2 26 09/30/2022  CL 104 09/30/2022    BUN 10 09/30/2022     No results found for: AMYLASENo results found for: LIPASE  Lab Results   Component Value Date    TOTBILI 0.4 09/30/2022    ALBUMIN 4.6 09/30/2022    AST 29 09/30/2022    ALT 34 09/30/2022     Lab Results   Component Value Date    INR 1.1 07/17/2022     Lab Results   Component Value Date    HEPCAB NONREACTIVE 07/15/2022   No components found for: ANANo components found for: SMOOTHMUSCAB, MITOAB        Assessment and plan:   Gastroesophageal Reflux Disease (GERD) and Hiatal Hernia: After a fall in October of the previous year, their symptoms worsened. They have been on Omeprazole 40mg  but inconsistently due to side effects and prefer surgical intervention over long-term medication. We discussed the possibility of a Transoral Incisionless Fundoplication (TIF) procedure combined with surgical hiatal hernia repair. We will conduct a diagnostic test to conclusively prove acid reflux and assess esophageal motility before proceeding.  - The risks and benefits of my recommendations, as well as other treatment options were discussed with the patient today. Questions were answered.        The patient was seen and discussed with the attending physician, Dr.Esfandayari.    Bronson Ing, MD  GI fellow (PGY-5)  [facility]   11/01/2022 5:11 PM

## 2022-11-02 ENCOUNTER — Encounter: Admit: 2022-11-02 | Discharge: 2022-11-02 | Payer: MEDICARE

## 2022-11-08 ENCOUNTER — Encounter: Admit: 2022-11-08 | Discharge: 2022-11-08 | Payer: MEDICARE

## 2022-11-09 ENCOUNTER — Ambulatory Visit: Admit: 2022-11-09 | Discharge: 2022-11-09 | Payer: MEDICARE

## 2022-11-09 DIAGNOSIS — K219 Gastro-esophageal reflux disease without esophagitis: Secondary | ICD-10-CM

## 2022-11-09 NOTE — Telephone Encounter
As we discussed gradually taper off of PPI 2 weeks prior to the procedure date (you can use Pepcid instead but stop it completely 2 days prior to the date).   We'll try to add her to the 7/8th schedule to facilitate time table.

## 2022-11-11 ENCOUNTER — Encounter: Admit: 2022-11-11 | Discharge: 2022-11-11 | Payer: MEDICARE

## 2022-11-13 ENCOUNTER — Encounter: Admit: 2022-11-13 | Discharge: 2022-11-13 | Payer: MEDICARE

## 2022-11-15 NOTE — Patient Instructions
Routine Clinic Information:    Make Checking in faster by using MyChart ahead of time by using the pre check in and complete the questionnaires. This will make your next visit Check in faster.      Please don't hesitate to call if you have any problems or questions.   My nurse, Vena Bassinger, RN, can be reached at [phone].  If she does not answer, please leave a voicemail as she is probably rooming other patients. Please leave your name, the spelling of your last name, date of birth, phone number they can call you back and a description of why you are calling. You may also message us in MyChart.    Our fax number is [phone].     Medication refills:  Please use the MyChart Refill request or contact your pharmacy directly to request medication refills.  Please allow at least 3 business days for refill requests  .  Lab work:  The main lab is on the 1st floor of the [facility].  [address], [address] [place], [place] [address]  LAB HOURS  Mon 7 a.m. - 6 p.m.  Tues 7 a.m. - 6 p.m.  Wed 7 a.m. - 6 p.m.  Thur 7 a.m. - 6 p.m.  Fri 7 a.m. - 6 p.m.  Sat 7 a.m. - noon  Sun Closed -  It is walk in only, they do not take appointments:  Other lab locations are available; please see [email or web address]    Test results:  You will receive your test results at your appointment, by MyChart (our patient portal), or via phone or letter.   We prefer to use MyChart as much as possible.  If you are expecting results and have not heard from my office within 2 weeks of your testing, please send a MyChart message or call my office.    As a part of the CARES act, starting 08/23/2019, some results will be released to mychart automatically.  With these changes you may see your results before I do.  Critical lab results will be addressed immediately, but otherwise please  give me 72 hours to view and respond to your results before reaching out with questions. Radiology is on the 2nd floor of the [facility], among several other locations.  Please contact the radiology department to schedule at [phone].    Scheduling is available directly through MyChart, or via phone at [phone].  Same day and urgent care appointments are available.    We offer same day appointments for your acute health concerns. These appointments are on a first come, first serve basis. Please call [phone] if you would like to make an appointment.   Appointment reminders may be received over the phone and by text.  Communication preferences can be managed in MyChart to ensure you receive important appointment notifications.    Support for many chronic illnesses is available through [facility]: [facility] or 913-574-0900.    We offer Integrated Behavioral Health services, nutrition support, and pharmacist support services free of charge.  To schedule an appointment with a specialist and/or testing please call the central scheduling number at [phone].     You may see my nurse practitioner, Katelyn Wulff, for urgent needs or if I am unavailable.  We are working as a team to provide better continuity and access to our patients.      If you ever have emergency symptoms of chest pain, shortness of breath or uncontrolled or unexplained pain, please go to   your closest emergency room.  You can give us an update after you have addressed any emergency.        For urgent issues after business hours/weekends/holidays call [phone] and request for the outpatient internal medicine physician to be paged.

## 2022-11-16 ENCOUNTER — Encounter: Admit: 2022-11-16 | Discharge: 2022-11-16 | Payer: MEDICARE

## 2022-11-16 ENCOUNTER — Ambulatory Visit: Admit: 2022-11-16 | Discharge: 2022-11-17 | Payer: MEDICARE

## 2022-11-16 DIAGNOSIS — M5136 Other intervertebral disc degeneration, lumbar region: Secondary | ICD-10-CM

## 2022-11-16 DIAGNOSIS — Z923 Personal history of irradiation: Secondary | ICD-10-CM

## 2022-11-16 DIAGNOSIS — E039 Hypothyroidism, unspecified: Secondary | ICD-10-CM

## 2022-11-16 DIAGNOSIS — IMO0002 Ulcer: Secondary | ICD-10-CM

## 2022-11-16 DIAGNOSIS — C50919 Malignant neoplasm of unspecified site of unspecified female breast: Secondary | ICD-10-CM

## 2022-11-16 DIAGNOSIS — Z8249 Family history of ischemic heart disease and other diseases of the circulatory system: Secondary | ICD-10-CM

## 2022-11-16 DIAGNOSIS — R32 Unspecified urinary incontinence: Secondary | ICD-10-CM

## 2022-11-16 DIAGNOSIS — M5134 Other intervertebral disc degeneration, thoracic region: Secondary | ICD-10-CM

## 2022-11-16 DIAGNOSIS — D539 Nutritional anemia, unspecified: Secondary | ICD-10-CM

## 2022-11-16 DIAGNOSIS — L821 Other seborrheic keratosis: Secondary | ICD-10-CM

## 2022-11-16 DIAGNOSIS — M549 Dorsalgia, unspecified: Secondary | ICD-10-CM

## 2022-11-16 DIAGNOSIS — L578 Other skin changes due to chronic exposure to nonionizing radiation: Secondary | ICD-10-CM

## 2022-11-16 DIAGNOSIS — F419 Anxiety disorder, unspecified: Secondary | ICD-10-CM

## 2022-11-16 DIAGNOSIS — M4800 Spinal stenosis, site unspecified: Secondary | ICD-10-CM

## 2022-11-16 DIAGNOSIS — B999 Unspecified infectious disease: Secondary | ICD-10-CM

## 2022-11-16 DIAGNOSIS — E7800 Pure hypercholesterolemia, unspecified: Secondary | ICD-10-CM

## 2022-11-16 DIAGNOSIS — M2550 Pain in unspecified joint: Secondary | ICD-10-CM

## 2022-11-16 DIAGNOSIS — E785 Hyperlipidemia, unspecified: Secondary | ICD-10-CM

## 2022-11-16 DIAGNOSIS — I10 Essential (primary) hypertension: Secondary | ICD-10-CM

## 2022-11-16 DIAGNOSIS — L301 Dyshidrosis [pompholyx]: Secondary | ICD-10-CM

## 2022-11-16 DIAGNOSIS — M810 Age-related osteoporosis without current pathological fracture: Secondary | ICD-10-CM

## 2022-11-16 DIAGNOSIS — F3289 Other specified depressive episodes: Secondary | ICD-10-CM

## 2022-11-16 DIAGNOSIS — L814 Other melanin hyperpigmentation: Secondary | ICD-10-CM

## 2022-11-16 DIAGNOSIS — E079 Disorder of thyroid, unspecified: Secondary | ICD-10-CM

## 2022-11-16 DIAGNOSIS — C801 Malignant (primary) neoplasm, unspecified: Secondary | ICD-10-CM

## 2022-11-16 DIAGNOSIS — M5030 Other cervical disc degeneration, unspecified cervical region: Secondary | ICD-10-CM

## 2022-11-16 DIAGNOSIS — L570 Actinic keratosis: Secondary | ICD-10-CM

## 2022-11-16 DIAGNOSIS — D2261 Melanocytic nevi of right upper limb, including shoulder: Secondary | ICD-10-CM

## 2022-11-16 DIAGNOSIS — Z85850 Personal history of malignant neoplasm of thyroid: Secondary | ICD-10-CM

## 2022-11-16 DIAGNOSIS — F32A Depression: Secondary | ICD-10-CM

## 2022-11-16 DIAGNOSIS — Z789 Other specified health status: Secondary | ICD-10-CM

## 2022-11-16 DIAGNOSIS — C73 Malignant neoplasm of thyroid gland: Secondary | ICD-10-CM

## 2022-11-16 DIAGNOSIS — R519 Generalized headaches: Secondary | ICD-10-CM

## 2022-11-16 DIAGNOSIS — W19XXXA Unspecified fall, initial encounter: Secondary | ICD-10-CM

## 2022-11-16 DIAGNOSIS — E890 Postprocedural hypothyroidism: Secondary | ICD-10-CM

## 2022-11-16 DIAGNOSIS — R011 Cardiac murmur, unspecified: Secondary | ICD-10-CM

## 2022-11-16 DIAGNOSIS — K219 Gastro-esophageal reflux disease without esophagitis: Secondary | ICD-10-CM

## 2022-11-16 DIAGNOSIS — Z9013 Acquired absence of bilateral breasts and nipples: Secondary | ICD-10-CM

## 2022-11-16 DIAGNOSIS — S83209A Unspecified tear of unspecified meniscus, current injury, unspecified knee, initial encounter: Secondary | ICD-10-CM

## 2022-11-16 NOTE — Patient Instructions
Thank you for choosing [facility]. I am happy that you are allowing me to participate in your care.     A summary of the recommendations made at today's visit is below:     General recommendations  Applying creams/ointments  If you were prescribed a topical medication, a small amount will suffice. A line of cream/ointment placed on your fingertip to the first joint (where the fingertip bends) should be sufficient to cover a surface area of the size of the palm of your hand. You do not need a thick layer the prescription cream/ointment.     Your rash may return after application of the prescription topical medicine. Repeat the course of treatment as discussed in your visit with me. Please let me know if you don't get enough relief, the rash spreads, or if new or worsening symptoms develop.    Gentle skin care for everyday  - Bathe with lukewarm water, 5-10 minutes. Dry thoroughly afterward by patting yourself with a towel. Avoid heavy rubbing  - Use a mild soap, such as Dove, Cetaphil, or any fragrance-free product  - Apply a moisturizer of your choice as much as you can (there's no limit like in prescription topical medications), but at least 2 times a day (even if no bath is taken) and after every bath.  After the bath, pat dry and apply the moisturizer right away. Use a thick moisturizer that says cream or ointment (not lotion that comes in a pump bottle - this is not as moisturizing, but does work). The stickier the better (like Vaseline or Aquaphor)! However, find one that you like and will use.  - For your face, you may use a gentle cleanser (such as CeraVe or Cetaphil just as examples) or plain water, followed by a non-comedogenic (meaning it won't clog pores) moisturizer with sunscreen SPF 30 or higher. A mineral sunscreen with zinc, titanium, or iron oxide are preferable      Sun protection is KEY to success in taking care of your skin!  Using sunscreens and sun avoidance will decrease your risk of developing skin cancers like melanoma, as well as prevent developing signs of aging like wrinkles, redness, and brown/dark spots.  - Avoid the sun at peak hours of the day (10 AM - 4 PM)   - Wear sunscreen SPF 30 or above. It should be reapplied every 2 hours if possible, or when you wipe off with a towel after swimming  - Use sunprotective clothing (shirts with long sleeves, pants, and wide-brimmed cowboy style hat). Baseball caps don't work well enough!    ** FOR MOISTURIZERS AND SUNSCREENS: I RECOMMEND ANY PRODUCT THAT YOU LIKE AND WILL USE CONSISTENTLY -- ANY BRAND/PRICE IS ACCEPTABLE **    Moles and other brown spots   - Common melanocytic nevi, or moles, tend to be less than 6 mm in diameter (size of a pencil eraser) and symmetric with even pigmentation, round or oval shape, regular outline, and sharp, non-fuzzy border. They can stick out from the skin.  - Flaky, seemingly stuck-on brown bumps are usually benign/non-cancerous keratoses, not moles. You will likely get more of these with time.  - Bright red smooth bumps that do not bleed are usually benign blood vessel lesions (cherry angiomas), not moles.  - It is normal to get new moles until the age of 57-22 years old.    Please contact me if you notice the following:  - A mole that differs from others (an ugly duckling that  has an odd shape, uneven or uncertain border, different colors), or one that changes, bleeds, or itches.  - Any growth that has one or more of the following features:  A - Asymmetry. (Concerning if spot is not symmetric--one half is unlike the other half)  B - Border. (Irregular, notched, or poorly defined border are concerning)  C - Color. (Multiple colors [shades of tan, brown or black, or is sometimes white, red, or blue] or changes in color are concerning)  D - Diameter. (Larger than 6mm, i.e., a pencil eraser)  E - Evolution. (An evolving or changing spot, which looks different from others on your body. If there's a new itch, tenderness, or bleeding develop, these are concerning changes too.  - An uneven, new, or large (greater than 3mm) brown or black streak underneath a fingernail or toenail, which may or may not spread onto the skin next to the nail  - A sore that just won't heal after about 2-4 weeks    Contact info  Routine - Please don't hesitate to let me know if you have additional questions or concerns before your next visit. I recommend directing your inquiries through MyChart. However, if you do not have MyChart access or prefer calling, the phone number is [phone], Option 4.   Urgent - For urgent, after-hours/weekend issues that are not emergencies, please call the [facility] Operator at [phone]. Ask to have paged the dermatologist on call. You will receive a call back from the dermatologist.  Emergency - Please call 911 or go to the nearest Emergency Department for all medical emergencies.    Follow up scheduling for visits greater than 6 months in the future  Your name will be put on the call back list if your recommended follow up time is greater than 6 months' time from your current appointment. The schedule opens only 6 months in advance, and a representative from [facility] will call you around that time to get you scheduled within the requested timeframe. It may be helpful for you to put a reminder in your calendar so you will remember to be on the look out for that call.    Procedure scheduling  All procedures need to be cleared through your insurance. This can take 2-3 weeks, or sometimes longer. If you do not hear back from [facility] within 1 month from the date that we discussed that you need a procedure, please send a MyChart message or call.    Zada Finders, MD MSc FAAD  Assistant Professor - Dermatology  Department of Internal Medicine  [facility]     CRYOTHERAPY INSTRUCTIONS    What should I expect after cryosurgery?  2 -- 24 hours:  Redness and swelling occurs almost immediately; a firm blister may form that may contain blood or clear fluid.    1 -- 2 days:  The blister (if present) may break and a clear to yellowish drainage and/or crust may develop.  Some bleeding may occur.  The site should not be noticeably painful.  3 -- 10 days:  Crusting and drainage will continue, but the swelling will decrease.  The redness will begin resolving.  10 days - 3 weeks:  Healing will continue, with residual redness.  3 - 6 weeks:  Complete healing.    How do I care for the treated area?  Wash the treated areas daily. Allow soapy water to run over the areas, but do not scrub.  Should a scab or  crust form, allow it to fall off on its own - do not remove or ?pick? at it.  The crust is protective to the new, healthy tissue growing underneath.    Application of ointment and a bandage may make you feel more comfortable, but is not necessary. If you would like to use an ointment to the cryotherapy site, we recommend Vaseline (petroleum jelly) or Aquaphor/Eucerin healing ointment     Will there be pain?  Pain is typically minimal. If you experience pain following the procedure, and your other medical conditions allow, Tylenol may be used.      If pain is severe, contact your physician.     Long term cryotherapy care  The cryotherapy site will be more sun sensitive than your surrounding skin.  Keep it covered, and remember to apply sun screen every day to all your sun exposed skin.  A scar may remain which is lighter or pinker than your normal skin. Your body will continue to improve your scar for up to one year, however a light colored scar may remain.  Infection following cryotherapy is rare. However, if you are worried by the appearance the treated area, contact your doctor.

## 2022-11-16 NOTE — Progress Notes
Date of Service: 11/16/2022     Dermatology Problem List:   # Dyshidrotic Eczema  - Clobetasol ointment  # AK - cryo  # No personal history of skin cancer  # FH NMSC - mother  # History of Breast Cancer S/p Radiation and Chemotherapy     Subjective:             Cindy Randolph is a 62 y.o. female.    History of Present Illness  Return  PATIENT referred by Lewayne Bunting. Last seen 08/2022 with Dr. Cecille Rubin, when the following was addressed    # Rash on hands  - Intermittent for years  - Currently flaring, with itching and redness  - Previously treated with Triamcinolone  - Current Treatment Clobetasol 0.05% ointment under occlusion. Now resolved and not needing    # Spot on L medial canthus  - Present 1 year  - Itchy and irritated  - resolved; patient forgot about it      Today,     scaling  Location: forehead, left cheek  Duration: several months ago  Associated local and/or systemic symptoms: scaling, dry, itching, new  Palliative factors: none  Provocative factors: none  Prior evaluation: none  Previous treatment: none  Current treatment: none     Past Medical History: No personal history of skin cancer  Family History: Brother with BCC  Social History: Retired Engineer, civil (consulting)       Review of Systems   Constitutional:  Negative for appetite change, diaphoresis, fatigue, fever and unexpected weight change.   HENT:  Negative for congestion, mouth sores and sore throat.    Eyes:  Negative for pain, redness, itching and visual disturbance.   Respiratory:  Negative for cough and shortness of breath.    Cardiovascular:  Negative for palpitations and leg swelling.   Gastrointestinal:  Negative for abdominal pain, blood in stool, diarrhea, nausea and vomiting.   Genitourinary:  Negative for difficulty urinating and hematuria.   Musculoskeletal:  Negative for arthralgias and myalgias.   Neurological:  Negative for dizziness, seizures and facial asymmetry.   Hematological:  Does not bruise/bleed easily. Psychiatric/Behavioral:  Negative for confusion and dysphoric mood. The patient is not nervous/anxious.      Objective:          acetaminophen (TYLENOL) 325 mg tablet Take two tablets by mouth every 6 hours. Take scheduled for 3 days after surgery, then as needed. Do not exceed 4,000mg  in a 24 hour period.    amLODIPine (NORVASC) 5 mg tablet Take one tablet by mouth daily.    aspirin 81 mg chewable tablet Chew one tablet by mouth daily.    biotin 1 mg cap Take one capsule by mouth daily.    CALCIUM PO Take 600 mg by mouth daily.    CHOLEcalciferoL (vitamin D3) 1,000 units tablet Take one tablet by mouth daily. Takes 5000 units    clobetasoL (TEMOVATE) 0.05 % topical ointment Apply  topically to affected area twice daily. Apply to areas of rash on hands twice daily; do NOT use on face/groin/underarms, use up to 2 weeks per month    diclofenac sodium (VOLTAREN) 1 % topical gel Apply four g topically to affected area daily as needed.    escitalopram oxalate (LEXAPRO) 20 mg tablet Take one-half tablet by mouth daily.    exemestane (AROMASIN) 25 mg tablet Take one tablet by mouth daily. Take after a meal.    ezetimibe (ZETIA) 10 mg tablet Take one tablet by mouth  daily.    Fish,Bora,Flax Oils-OM3,6,9 #1 (TRIPLE OMEGA) 400-400-400 mg capsule Take one capsule by mouth daily.    gabapentin (NEURONTIN) 100 mg capsule Take one capsule by mouth at bedtime daily.    metoprolol succinate XL (TOPROL XL) 25 mg extended release tablet Take one tablet by mouth at bedtime daily.    omeprazole DR (PRILOSEC) 40 mg capsule Take one capsule by mouth daily.    other medication K Complete K1 & K2 as MK-4 & MK-7 : Take 1 softgel by mouth once daily    rosuvastatin (CRESTOR) 10 mg tablet Take one tablet by mouth daily.    Selenium 100 mcg tab Take one tablet by mouth daily.    SYNTHROID 100 mcg tablet Take one tablet by mouth daily 30 minutes before breakfast.    traMADoL 25 mg tablet Take one tablet by mouth every 12 hours as needed. vitamins, B complex tab Take one tablet by mouth daily.    vitamins, multiple cap Take one capsule by mouth daily.    zinc sulfate 220 mg (50 mg elemental zinc) capsule Take one capsule by mouth daily.     Vitals:    11/16/22 1358   PainSc: Zero   Weight: 72.6 kg (160 lb)   Height: 160 cm (5' 3)     Body mass index is 28.34 kg/m?Marland Kitchen     Physical Exam    General: Alert, NAD   Eye: Normal conjunctivae  Psych: Normal mood    Areas Examined (all normal unless noted below):  Head/Face    Declined FBSE today    Pertinent findings include:    - left cheek, forehead erythematous macule with overlying gritty yellow scale (cryo)    - Scaly erythematous plaque on L medial canthus is not present on exam today    Head/neck with brown, symmetric macules and papules with regular pigment network on dermoscopy    Head/neck with tan and brown, waxy, stuck-on, thin papules    Head/neck with photo-distributed tan feathery macules           Assessment and Plan:    # Actinic keratosis  # Photodamage/lentigo, chronic, not at goal  Recommend treatment of lesions noted on exam today suspicious for actinic keratosis as they represent precancer and possible evolution to nonmelanoma skin cancer. Therapeutic options discussed; will proceed with empiric treatment as below. However, patient will contact this author if the lesions fail to resolve or worsen, changes unsatisfactorily, or symptoms arise. she verbalized understanding and is in agreement with this recommendation/plan.    Procedure note (cryotherapy):  Location(s): Central forehead x1,  left zygomatic cheek x1  Number of lesions treated: 2  Lesions treated with liquid nitrogen today x  2 cycles after verbal consent was obtained from the patient including discussion of the risks including but not limited to scar, post-inflammatory pigment changes, infection, blister, pain, recurrence, and further treatment.  Wound care instructions discussed.     Unique, patient-specific risk factor to consider regarding the procedure: none    The anticipated therapeutic course was reviewed in detail  The patient understands to contact us with any questions or concerns during the process    In reserve: repeat treatment or further investigation, such as biopsy    Counseled on appropriate sun avoidance by limiting outdoor activities between 10AM-4PM, applying sunscreen with SPF 30 or above (with reapplication if wiped off or per instructions on bottle), and using a physical blockade by protective clothing. The ABCDEs of melanoma was discussed.    #  NUO A) L medial canthus - resolved     # Dyshidrotic Eczema, chronic, improved, not at goal   - Discussed diagnosis, course of disease, prognosis, and treatment with patient at last visit  - Discussed possible triggers such as cleaning products or cooking at last visit  - Discussed vinyl glove use when cooking, cleaning, etc at last visit. May continue   - Counseled to wash hands with luke-warm water and a fragrance free soap followed by hand cream or Vaseline with every washing   - Recommended moisturizer under cotton gloves nightly for flares  - Discussed possible ACD component and switching to hypoallergenic products at last visit  - Continue (RX) Clobetasol 0.05% ointment to AA on hands 1-2 times daily followed by moisturizer, may use under occlusion at night, avoid use on face/groin/axillae; may use now as needed    # Nevi  # seborrheic keratosis  - Continue with sun protection as advised      RTC as scheduled for FBSE             In the presence of Zada Finders, MD,  I have taken down these notes, Alisha, Scribe. 11/16/2022 2:15 PM       Zada Finders, MD MSc FAAD  Assistant Professor - Division of Dermatology  Department of Internal Medicine  [facility]

## 2022-11-19 ENCOUNTER — Encounter: Admit: 2022-11-19 | Discharge: 2022-11-19 | Payer: MEDICARE

## 2022-11-19 ENCOUNTER — Ambulatory Visit: Admit: 2022-11-19 | Discharge: 2022-11-19 | Payer: MEDICARE

## 2022-11-19 ENCOUNTER — Ambulatory Visit: Admit: 2022-11-19 | Discharge: 2022-11-20 | Payer: MEDICARE

## 2022-11-19 DIAGNOSIS — M609 Myositis, unspecified: Secondary | ICD-10-CM

## 2022-11-19 DIAGNOSIS — G4700 Insomnia, unspecified: Secondary | ICD-10-CM

## 2022-11-19 DIAGNOSIS — M2550 Pain in unspecified joint: Secondary | ICD-10-CM

## 2022-11-19 DIAGNOSIS — M810 Age-related osteoporosis without current pathological fracture: Secondary | ICD-10-CM

## 2022-11-19 DIAGNOSIS — D539 Nutritional anemia, unspecified: Secondary | ICD-10-CM

## 2022-11-19 DIAGNOSIS — C801 Malignant (primary) neoplasm, unspecified: Secondary | ICD-10-CM

## 2022-11-19 DIAGNOSIS — E079 Disorder of thyroid, unspecified: Secondary | ICD-10-CM

## 2022-11-19 DIAGNOSIS — C73 Malignant neoplasm of thyroid gland: Secondary | ICD-10-CM

## 2022-11-19 DIAGNOSIS — R931 Abnormal findings on diagnostic imaging of heart and coronary circulation: Secondary | ICD-10-CM

## 2022-11-19 DIAGNOSIS — B999 Unspecified infectious disease: Secondary | ICD-10-CM

## 2022-11-19 DIAGNOSIS — K449 Diaphragmatic hernia without obstruction or gangrene: Secondary | ICD-10-CM

## 2022-11-19 DIAGNOSIS — F419 Anxiety disorder, unspecified: Secondary | ICD-10-CM

## 2022-11-19 DIAGNOSIS — E7800 Pure hypercholesterolemia, unspecified: Secondary | ICD-10-CM

## 2022-11-19 DIAGNOSIS — Z136 Encounter for screening for cardiovascular disorders: Secondary | ICD-10-CM

## 2022-11-19 DIAGNOSIS — K219 Gastro-esophageal reflux disease without esophagitis: Secondary | ICD-10-CM

## 2022-11-19 DIAGNOSIS — F3289 Other specified depressive episodes: Secondary | ICD-10-CM

## 2022-11-19 DIAGNOSIS — Z8249 Family history of ischemic heart disease and other diseases of the circulatory system: Secondary | ICD-10-CM

## 2022-11-19 DIAGNOSIS — M21941 Unspecified acquired deformity of hand, right hand: Secondary | ICD-10-CM

## 2022-11-19 DIAGNOSIS — I10 Essential (primary) hypertension: Secondary | ICD-10-CM

## 2022-11-19 DIAGNOSIS — I77810 Thoracic aortic ectasia: Secondary | ICD-10-CM

## 2022-11-19 DIAGNOSIS — C50919 Malignant neoplasm of unspecified site of unspecified female breast: Secondary | ICD-10-CM

## 2022-11-19 DIAGNOSIS — Z923 Personal history of irradiation: Secondary | ICD-10-CM

## 2022-11-19 DIAGNOSIS — S83209A Unspecified tear of unspecified meniscus, current injury, unspecified knee, initial encounter: Secondary | ICD-10-CM

## 2022-11-19 DIAGNOSIS — R519 Generalized headaches: Secondary | ICD-10-CM

## 2022-11-19 DIAGNOSIS — M5030 Other cervical disc degeneration, unspecified cervical region: Secondary | ICD-10-CM

## 2022-11-19 DIAGNOSIS — M5136 Other intervertebral disc degeneration, lumbar region: Secondary | ICD-10-CM

## 2022-11-19 DIAGNOSIS — E785 Hyperlipidemia, unspecified: Secondary | ICD-10-CM

## 2022-11-19 DIAGNOSIS — F32A Depression: Secondary | ICD-10-CM

## 2022-11-19 DIAGNOSIS — Z789 Other specified health status: Secondary | ICD-10-CM

## 2022-11-19 DIAGNOSIS — Z9189 Other specified personal risk factors, not elsewhere classified: Secondary | ICD-10-CM

## 2022-11-19 DIAGNOSIS — R748 Abnormal levels of other serum enzymes: Secondary | ICD-10-CM

## 2022-11-19 DIAGNOSIS — E890 Postprocedural hypothyroidism: Secondary | ICD-10-CM

## 2022-11-19 DIAGNOSIS — E782 Mixed hyperlipidemia: Secondary | ICD-10-CM

## 2022-11-19 DIAGNOSIS — M549 Dorsalgia, unspecified: Secondary | ICD-10-CM

## 2022-11-19 DIAGNOSIS — M4800 Spinal stenosis, site unspecified: Secondary | ICD-10-CM

## 2022-11-19 DIAGNOSIS — C50112 Malignant neoplasm of central portion of left female breast: Secondary | ICD-10-CM

## 2022-11-19 DIAGNOSIS — W19XXXA Unspecified fall, initial encounter: Secondary | ICD-10-CM

## 2022-11-19 DIAGNOSIS — Z85850 Personal history of malignant neoplasm of thyroid: Secondary | ICD-10-CM

## 2022-11-19 DIAGNOSIS — R32 Unspecified urinary incontinence: Secondary | ICD-10-CM

## 2022-11-19 DIAGNOSIS — Z9013 Acquired absence of bilateral breasts and nipples: Secondary | ICD-10-CM

## 2022-11-19 DIAGNOSIS — E039 Hypothyroidism, unspecified: Secondary | ICD-10-CM

## 2022-11-19 DIAGNOSIS — M5134 Other intervertebral disc degeneration, thoracic region: Secondary | ICD-10-CM

## 2022-11-19 DIAGNOSIS — IMO0002 Ulcer: Secondary | ICD-10-CM

## 2022-11-19 DIAGNOSIS — R011 Cardiac murmur, unspecified: Secondary | ICD-10-CM

## 2022-11-19 LAB — SED RATE: ESR: 19 mm/h (ref 0–30)

## 2022-11-19 LAB — RHEUMATOID FACTOR (RF): RF SCREEN: <10 [IU]/mL (ref <25)

## 2022-11-19 LAB — LIPID PROFILE
CHOLESTEROL: 187 mg/dL (ref <200)
LDL: 114 mg/dL — ABNORMAL HIGH (ref <100)
NON HDL CHOLESTEROL: 131 mg/dL
TRIGLYCERIDES: 94 mg/dL (ref <150)
VLDL: 19 mg/dL

## 2022-11-19 LAB — CCP IGG ANTIBODY

## 2022-11-19 LAB — CREATINE KINASE-CPK: CK TOTAL: 158 U/L (ref 21–215)

## 2022-11-19 LAB — PHOSPHORUS: PHOSPHORUS: 3.6 mg/dL (ref >60)

## 2022-11-19 LAB — C REACTIVE PROTEIN (CRP): C-REACTIVE PROTEIN: 0.1 mg/dL (ref <1.0)

## 2022-11-19 LAB — URIC ACID: URIC ACID: 5.4 mg/dL (ref 2.0–7.0)

## 2022-11-19 NOTE — Assessment & Plan Note
Patient reports worsening sleep quality. Previous sleep study suggested possible sleep apnea, but patient was skeptical of results.  -Ordered in-hospital sleep study at [facility] to reassess for sleep apnea.  -Continue gabapentin

## 2022-11-19 NOTE — Assessment & Plan Note
Persistent joint pain in hands, more than baseline. No recent imaging of hands. No other joint swelling noted.  -Ordered X-ray of right hand to assess for osteoarthritis or other abnormalities.  -Ordered rheumatology panel to assess for possible rheumatologic conditions.  -Continue Gabapentin 100 mg HS, consider increasing dose to 300mg  at bedtime for pain and sleep.

## 2022-11-19 NOTE — Progress Notes
Answers submitted by the patient for this visit:  Other Symptom Questionnaire (Submitted on 11/17/2022)  Chief Complaint: Patient Reported Other  What topic(s) would you like to cover during your appointment?: F/U previous visit, Updates in health, Request for sleep study, Advice on complexity of health issues  Please describe the issue(s) and history with the issue (location, severity, duration, symptoms, etc.). : Re: sleep study, snoring/sleep apnea  What has been done so far to take care of the issue(s)?: Sleep with pillows for optimal support,, Deep breathing exercises, Increased physical activity  What are your goals for this visit?: See topics above

## 2022-11-19 NOTE — Assessment & Plan Note
On Omeprazole 40mg . Patient desires surgical intervention due to symptoms and desire to discontinue Omeprazole.  Was seen by gastroenterology on 11/01/2022, possible transoral incision less fundoplication (TIF) procedure combined with surgical hiatal hernia repair plan  -Plan for diagnostic test to conclusively prove acid reflux and assess esophageal motility before proceeding.  Plan for EGD with 96 hours Bravo pH study off of PPI to document acid reflux disease.

## 2022-11-19 NOTE — Assessment & Plan Note
Well controlled  Follows with cardiology  Continue toprol XL 25 mg at bedtime and amlodipine 5 mg daily  Advised to check BP regularly and maintain BP log to provide during next visit

## 2022-11-19 NOTE — Progress Notes
11/19/2022     Subjective:       Patient Reported Other  What topic(s) would you like to cover during your appointment?:  F/U previous visit  Updates in health  Request for sleep study  Advice on complexity of health issues  Please describe the issue(s) and history with the issue (location, severity, duration, symptoms, etc.).:  Re: sleep study, snoring/sleep apnea  What has been done so far to take care of the issue(s)?:  Sleep with pillows for optimal support,  Deep breathing exercises  Increased physical activity  What are your goals for this visit?:  See topics above    Cindy Randolph is a 62 y.o. female.  Follow Up and Sleep Problem     Has history of left breast cancer s/p mastectomy in 2022, cervical spine radiculopathy, lumbar degenerative disease, osteoporosis, migraine, depression and GERD with hiatal hernia.      Was seen by gastroenterology on 11/01/2022 for GERD and hiatal hernia.  Patient prefers surgical intervention over long-term medication hence discussion regarding the possibility of transoral incision less fundoplication procedure combined with surgical hiatal hernia repair occurred.  Plan for diagnostic test to conclusively prove acid reflux and assess esophageal motility before proceeding.  Plan for EGD with 96 hours Bravo pH study off of PPI to document acid reflux disease.    The patient, with a history of gastroesophageal reflux disease (GERD), and possible rheumatologic condition, presents for a follow-up visit. She reports improvement in previously experienced arm pain after discontinuing statin and omeprazole medications. However, she still experiences persistent joint pain in the hands, which she describes as worse than before. She also reports an increase in numbness and tingling in the hands, which she attributes to worsening Heberden's nodes. No other joint swelling or pain has been noted.    The patient also reports a history of sleep disturbances, with a previous sleep study suggesting possible sleep apnea. She expresses interest in repeating the sleep study due to concerns about the validity of the previous results.    In terms of her GERD, the patient has resumed omeprazole due to worsening symptoms and is considering a surgical intervention for long-term management.    The patient also mentions joining a gym and exercising three times a week, which she hopes will improve her sleep over time. She is currently on amlodipine and Toprol for blood pressure management, and reports no issues with dizziness. She continues to take baby aspirin and has recently restarted gabapentin for pain management.             Objective:          acetaminophen (TYLENOL) 325 mg tablet Take two tablets by mouth every 6 hours. Take scheduled for 3 days after surgery, then as needed. Do not exceed 4,000mg  in a 24 hour period.    amLODIPine (NORVASC) 5 mg tablet Take one tablet by mouth daily.    aspirin 81 mg chewable tablet Chew one tablet by mouth daily.    biotin 1 mg cap Take one capsule by mouth daily.    CALCIUM PO Take 600 mg by mouth daily.    CHOLEcalciferoL (vitamin D3) 1,000 units tablet Take one tablet by mouth daily. Takes 5000 units    clobetasoL (TEMOVATE) 0.05 % topical ointment Apply  topically to affected area twice daily. Apply to areas of rash on hands twice daily; do NOT use on face/groin/underarms, use up to 2 weeks per month    diclofenac sodium (VOLTAREN) 1 %  topical gel Apply four g topically to affected area daily as needed.    escitalopram oxalate (LEXAPRO) 20 mg tablet Take one-half tablet by mouth daily.    ezetimibe (ZETIA) 10 mg tablet Take one tablet by mouth daily.    Fish,Bora,Flax Oils-OM3,6,9 #1 (TRIPLE OMEGA) 400-400-400 mg capsule Take one capsule by mouth daily.    gabapentin (NEURONTIN) 100 mg capsule Take one capsule by mouth at bedtime daily.    metoprolol succinate XL (TOPROL XL) 25 mg extended release tablet Take one tablet by mouth at bedtime daily.    omeprazole DR (PRILOSEC) 40 mg capsule Take one capsule by mouth daily.    other medication K Complete K1 & K2 as MK-4 & MK-7 : Take 1 softgel by mouth once daily    rosuvastatin (CRESTOR) 10 mg tablet Take one tablet by mouth daily.    Selenium 100 mcg tab Take one tablet by mouth daily.    SYNTHROID 100 mcg tablet Take one tablet by mouth daily 30 minutes before breakfast.    traMADoL 25 mg tablet Take one tablet by mouth every 12 hours as needed.    vitamins, B complex tab Take one tablet by mouth daily.    vitamins, multiple cap Take one capsule by mouth daily.    zinc sulfate 220 mg (50 mg elemental zinc) capsule Take one capsule by mouth daily.     Vitals:    11/19/22 0814   BP: 100/73   BP Source: Arm, Right Upper   Pulse: 78   Temp: 36.6 ?C (97.9 ?F)   Resp: 16   SpO2: 97%   TempSrc: Oral   PainSc: Five   Weight: 74 kg (163 lb 3.2 oz)   Height: (P) 157.5 cm (5' 2)     Body mass index is 29.85 kg/m? (pended).     Physical Exam  Vitals reviewed.   HENT:      Head: Normocephalic and atraumatic.   Pulmonary:      Effort: Pulmonary effort is normal.   Musculoskeletal:      Comments: No joint tenderness or swelling noted over both hands   Skin:     General: Skin is warm and dry.   Neurological:      Mental Status: She is alert and oriented to person, place, and time.   Psychiatric:         Mood and Affect: Mood normal.         Behavior: Behavior normal.         BP Readings from Last 5 Encounters:   11/19/22 100/73   11/01/22 121/82   09/29/22 120/82   09/06/22 121/78   08/19/22 120/74        Wt Readings from Last 5 Encounters:   11/19/22 74 kg (163 lb 3.2 oz)   11/16/22 72.6 kg (160 lb)   11/01/22 74.8 kg (165 lb)   09/29/22 73.7 kg (162 lb 6.4 oz)   09/06/22 74.2 kg (163 lb 9.6 oz)        Health Maintenance   Topic Date Due    RSV VACCINE (60 YEARS AND OLDER) (1 - 1-dose 60+ series) Never done    COVID-19 VACCINE (3 - 2023-24 season) 06/23/2023 (Originally 01/22/2022)    MEDICARE ANNUAL WELLNESS VISIT  02/09/2023    CERVICAL CANCER SCREENING  10/03/2024    DTAP/TDAP VACCINES (3 - Td or Tdap) 11/21/2025    COLORECTAL CANCER SCREENING  10/15/2031    SHINGLES RECOMBINANT VACCINE  Completed  HIV SCREENING  Completed    HEPATITIS C SCREENING  Completed    INFLUENZA VACCINE  Completed    HPV VACCINES  Aged Out    PNEUMOCOCCAL VACCINE 0-64 YRS  Aged Out    BREAST CANCER SCREENING  Discontinued               Assessment and Plan:    Problem   Deformity of Right Hand   Insomnia   Mixed Hyperlipidemia   Chronic Pain of Multiple Joints   Hiatal Hernia With Gastroesophageal Reflux    04/01/2022- Per OV Dr. Maisie Fus      Malignant Neoplasm of Left Breast in Female, Estrogen Receptor Positive (Hcc)    DIAGNOSIS:  Left grade 1 ILC (ER  96%, PR 10%, HER2 0, Ki-67 3%) at 12:00 dx 11/2020     HISTORY:  Ms. Casperson is a female who presented to the [facility] on 12/24/2020 at age 49 for left breast cancer.  She reports noticing Left breast intermittent discomfort over the last 2 months.  She had Screening mammogram at [facility] on 11/25/2020 which identified a focal asymmetry in the left breast.  She returned for left diagnostic mammgoram and ultrasound on 12/04/2020 which showed an ill-defined hypoechoic area at 12:00 7 cm FTN measuring 1.7 cm.   She underwent Left ultrasound guided biopsy on 12/10/2020 which revealed grade 1 hormone positive, HER2 negative ILC with associated LCIS. She has no breast complaints.  She proceeded with Bilateral total mastectomy/Left SLNB with oncoplastic flat closure on 01/22/2021.  Final surgical pathology revealed multifocal grade 1 ILC that merge to measuring 7.4 cm with associated LCIS, clear margins and 5/5 lymph nodes. The right breast is benign.   Adjuvant chemotherapy and cALND were recommended.  She completed adjuvant chemotherapy of dd AC from 10/3 to 04/06/2021, taxol was recommended but she declined.  She returned for Left completion ALND on 05/14/2021.  Final surgical pathology revealed 6/14 lymph nodes positive. She finished radiation with Dr. Haskel Khan on 07/01/21.    BREAST IMAGING:  Mammogram:    - Screening mammogram 11/25/2020 ([facility]) revealed scattered fibroglandular tissue density.  Benign appearing microcalcification. Unchanged right upper central anterior breast low density circumscribed masses dating back to 2013.  Focal asymmetry located within the left posterior central breast, 12:00, 7.3 cm FTN most conspicuous on MLO.  Recommend spot compression.    - Left diagnostic mammogram 12/04/2020 ([facility]) revealed irregular density at 12:00 left breast persists.  Ultrasound recommended.    Ultrasound:    Left targeted ultrasound 12/04/2020 ([facility]) revealed an ill-defined area which was slightly hypoechoic at 12:00, 7 cm FTN.  This was indeterminate and biopsy was recommended.   MRI:    - Breast MRI 12/26/2020 ([facility]) The breast tissue is scattered areas of fibroglandular tissue.   There is mild background parenchymal enhancement. Left breast: There are multiple faint discontiguous irregular enhancing masses and discontiguous intervening nonmass enhancement involving predominantly the upper and inner left breast from anterior to posterior depth measuring in aggregate 6.3 cm AP by 3.6 cm transverse by 6.4 cm craniocaudal. Several areas demonstrate persistent enhancement kinetics. The artifact from tissue marker clips (ribbon and heart) at the site of biopsy demonstrating malignancy are seen within the upper left breast at the 12:00 position, middle depth (image 80). A representative discontiguous irregular enhancing mass is seen within the inner left breast at the 9:00 position, middle depth as on image 111, which demonstrates a mammographic correlate on outside CC tomosynthesis slice 18. Anterior  extent of abnormal enhancement extends to the base of the left nipple. Posterior extent of abnormal enhancement is approximately 2.1 cm anterior to the underlying pectoralis muscle. There are greater than 5 small round morphologically abnormal and asymmetric level 1 left axillary lymph nodes. No suspicious level 2 or level 3 lymph nodes are seen. No suspicious internal mammary lymph nodes are seen. Right breast: No suspicious mass or nonmass enhancement is seen within the right breast. No suspicious right axillary or internal mammary lymph nodes are identified. Incidental Findings: None     REPRODUCTIVE HEALTH:  Age at first Menarche:  53  Age at First Live Birth:  21  Age at Menopause:  62  Gravida:  8  Para: 8  Breastfeeding:  yes    PROCEDURE:  Bilateral Total mastectomy/Left SLNB oncoplastic flat closure 01/22/2021 ([facility])  2. Left ALND 05/14/2021 [facility])  PATHOLOGY: multifocal grade 1 ILC that merge to measuring 7.4 cm with associated LCIS, clear margins and 5/5 lymph nodes. The right breast is benign.   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  No family history of ovarian or prostate cancer.   MEDICAL ONCOLOGY:    Dr. Neil Crouch Adjuvant chemotherapy:  ddACx 4 completed 04/06/2021, Taxol recommended but declined; Letrozole/Verzenio  REFERRED BY:  Dr. Erskine Emery  Formatting of this note might be different from the original. Formatting of this note might be different from the original. DIAGNOSIS:  Left grade 1 ILC (ER 91-100, PR 11-20, HER 1+, Ki 67 2-5%) at 12:00 dx 11/2020 HISTORY:  Ms. Haisten is a female who presented to the [facility] on 12/24/2020 at age 70 for left breast cancer.  She reports noticing Left breast intermittent discomfort over the last 2 months.  She had Screening mammogram at [facility] on 11/25/2020 which identified a focal asymmetry in the left breast.  She returned for left diagnostic mammgoram and ultrasound on 12/04/2020 which showed an ill-defined hypoechoic area at 12:00 7 cm FTN measuring 1.7 cm.   She underwent Left ultrasound guided biopsy on 12/10/2020 which revealed grade 1 hormone positive, HER2 negative ILC with associated LCIS. She has no breast complaints. BREAST IMAGING: Mammogram:   - Screening mammogram 11/25/2020 ([facility]) revealed scattered fibroglandular tissue density.  Benign appearing microcalcification. Unchanged right upper central anterior breast low density circumscribed masses dating back to 2013.  Focal asymmetry located within the left posterior central breast, 12:00, 7.3 cm FTN most conspicuous on MLO.  Recommend spot compression.   - Left diagnostic mammogram 12/04/2020 ([facility]) revealed irregular density at 12:00 left breast persists.  Ultrasound recommended.  Ultrasound:  Left targeted ultrasound 12/04/2020 ([facility]) revealed an ill-defined area which was slightly hypoechoic at 12:00, 7 cm FTN.  This was indeterminate and biopsy was recommended. MRI:   - Breast MRI 12/26/2020 () .... REPRODUCTIVE HEALTH: Age at first Menarche:  49 Age at First Live Birth:  89 Age at Menopause:  82 Gravida:  8 Para: 8 Breastfeeding:  yes PROCEDURE:  pending PERTINENT PMH:  Thyroid Cancer (1980- thyroidectomy), HTN FAMILY HISTORY:  Mother- Breast cancer (65).  No family history of ovarian or prostate cancer. PHYSICAL EXAM on PRESENTATION:   MEDICAL ONCOLOGY:    Dr. Neil Crouch REFERRED BY:  Dr. Erskine Emery     Primary Hypertension    Hypertension with pregnancy initially but sustained after 8th pregnancy.          Problem List Items Addressed This Visit  CARDIAC AND VASCULATURE    Primary hypertension     Well controlled  Follows with cardiology  Continue toprol XL 25 mg at bedtime and amlodipine 5 mg daily  Advised to check BP regularly and maintain BP log to provide during next visit         Mixed hyperlipidemia     Follows with cardiology  Patient has discontinued statin (crestor) due to concern about muscle pain. Currently on Zetia 10 mg daily.  -Consider rechecking lipid panel in a couple of months to assess control with Zetia alone.  -Continue current regimen. GASTROINTESTINAL AND ABDOMINAL    Hiatal hernia with gastroesophageal reflux     On Omeprazole 40mg . Patient desires surgical intervention due to symptoms and desire to discontinue Omeprazole.  Was seen by gastroenterology on 11/01/2022, possible transoral incision less fundoplication (TIF) procedure combined with surgical hiatal hernia repair plan  -Plan for diagnostic test to conclusively prove acid reflux and assess esophageal motility before proceeding.  Plan for EGD with 96 hours Bravo pH study off of PPI to document acid reflux disease.            HEMATOLOGY AND NEOPLASIA    Malignant neoplasm of left breast in female, estrogen receptor positive (HCC)     Completed 4 cycles of adjuvant AC, declined Taxol. She completed radiation 07/01/21.   Follows with oncology  Plan was at least 5 years endocrine therapy. She did not tolerate letrozole (joint arthralgia). Changed to Anastrozole 01/11/22, did not tolerate due to joint arthralgia. Switched to [facility] 06/16/22. [name] stopped her AI on 09/04/22.            MUSCULOSKELETAL AND INJURIES    Chronic pain of multiple joints - Primary     Persistent joint pain in hands, more than baseline. No recent imaging of hands. No other joint swelling noted.  -Ordered X-ray of right hand to assess for osteoarthritis or other abnormalities.  -Ordered rheumatology panel to assess for possible rheumatologic conditions.  -Continue Gabapentin 100 mg HS, consider increasing dose to 300mg  at bedtime for pain and sleep.         Relevant Orders    RHEUMATOID FACTOR (RF)    URIC ACID    ANTI-NUCLEAR ANTIBODY(ANA)    CCP IGG ANTIBODY    ANTI-DNA DOUBLE STRAND    C REACTIVE PROTEIN (CRP)    SED RATE    PHOSPHORUS    Deformity of right hand    Relevant Orders    HAND MIN 3 VIEWS RIGHT       SLEEP    Insomnia     Patient reports worsening sleep quality. Previous sleep study suggested possible sleep apnea, but patient was skeptical of results.  -Ordered in-hospital [facility] to reassess for sleep apnea.  -Continue gabapentin          Other Visit Diagnoses       History of sleep apnea        Relevant Orders    SLEEP STUDY             Patient Instructions   Routine Clinic Information:    Make Checking in faster by using MyChart ahead of time by using the pre check in and complete the questionnaires. This will make your next visit Check in faster.      Please don't hesitate to call if you have any problems or questions.   My nurse, Raquel, RN, can be reached at [phone].  If she  does not answer, please leave a voicemail as she is probably rooming other patients. Please leave your name, the spelling of your last name, date of birth, phone number they can call you back and a description of why you are calling. You may also message [place] in MyChart.    Our fax number is [phone].     Medication refills:  Please use the MyChart Refill request or contact your pharmacy directly to request medication refills.  Please allow at least 3 business days for refill requests  .  Lab work:  The main lab is on the 1st floor of the [facility].  [address], [address] [place], [place] [address]  LAB HOURS  Mon 7 a.m. - 6 p.m.  Tues 7 a.m. - 6 p.m.  Wed 7 a.m. - 6 p.m.  Thur 7 a.m. - 6 p.m.  Fri 7 a.m. - 6 p.m.  Sat 7 a.m. - noon  Sun Closed -  It is walk in only, they do not take appointments:  Other lab locations are available; please see [email or web address]    Test results:  You will receive your test results at your appointment, by MyChart (our patient portal), or via phone or letter.   We prefer to use MyChart as much as possible.  If you are expecting results and have not heard from my office within 2 weeks of your testing, please send a MyChart message or call my office.    As a part of the CARES act, starting 08/23/2019, some results will be released to mychart automatically.  With these changes you may see your results before I do.  Critical lab results will be addressed immediately, but otherwise please  give me 72 hours to view and respond to your results before reaching out with questions.      Radiology is on the 2nd floor of the [address], among several other locations.  Please contact the radiology department to schedule at [phone].    Scheduling is available directly through MyChart, or via phone at [phone].  Same day and urgent care appointments are available.    We offer same day appointments for your acute health concerns. These appointments are on a first come, first serve basis. Please call [phone] if you would like to make an appointment.   Appointment reminders may be received over the phone and by text.  Communication preferences can be managed in MyChart to ensure you receive important appointment notifications.    Support for many chronic illnesses is available through [employer]: [employer] or [phone].    We offer Integrated Behavioral Health services, nutrition support, and pharmacist support services free of charge.  To schedule an appointment with a specialist and/or testing please call the central scheduling number at [phone].     You may see my nurse practitioner, Olga Coaster, for urgent needs or if I am unavailable.  We are working as a team to provide better continuity and access to our patients.      If you ever have emergency symptoms of chest pain, shortness of breath or uncontrolled or unexplained pain, please go to your closest emergency room.  You can give [place] an update after you have addressed any emergency.        For urgent issues after business hours/weekends/holidays call [phone] and request for the outpatient internal medicine physician to be paged.      Return in 3 months (on 02/09/2023) for Medicare AWV for 40 mins.

## 2022-11-19 NOTE — Assessment & Plan Note
Follows with cardiology  Patient has discontinued statin (crestor) due to concern about muscle pain. Currently on Zetia 10 mg daily.  -Consider rechecking lipid panel in a couple of months to assess control with Zetia alone.  -Continue current regimen.

## 2022-11-20 DIAGNOSIS — M2550 Pain in unspecified joint: Secondary | ICD-10-CM

## 2022-11-20 DIAGNOSIS — G8929 Other chronic pain: Secondary | ICD-10-CM

## 2022-11-20 DIAGNOSIS — Z8669 Personal history of other diseases of the nervous system and sense organs: Secondary | ICD-10-CM

## 2022-11-20 DIAGNOSIS — M21941 Unspecified acquired deformity of hand, right hand: Secondary | ICD-10-CM

## 2022-11-22 ENCOUNTER — Encounter: Admit: 2022-11-22 | Discharge: 2022-11-22 | Payer: MEDICARE

## 2022-11-23 ENCOUNTER — Encounter: Admit: 2022-11-23 | Discharge: 2022-11-23 | Payer: MEDICARE

## 2022-11-23 ENCOUNTER — Ambulatory Visit: Admit: 2022-11-23 | Discharge: 2022-11-24 | Payer: MEDICARE

## 2022-11-23 DIAGNOSIS — S83209A Unspecified tear of unspecified meniscus, current injury, unspecified knee, initial encounter: Secondary | ICD-10-CM

## 2022-11-23 DIAGNOSIS — E079 Disorder of thyroid, unspecified: Secondary | ICD-10-CM

## 2022-11-23 DIAGNOSIS — Z9013 Acquired absence of bilateral breasts and nipples: Secondary | ICD-10-CM

## 2022-11-23 DIAGNOSIS — Z923 Personal history of irradiation: Secondary | ICD-10-CM

## 2022-11-23 DIAGNOSIS — E7800 Pure hypercholesterolemia, unspecified: Secondary | ICD-10-CM

## 2022-11-23 DIAGNOSIS — C73 Malignant neoplasm of thyroid gland: Secondary | ICD-10-CM

## 2022-11-23 DIAGNOSIS — M5136 Other intervertebral disc degeneration, lumbar region: Secondary | ICD-10-CM

## 2022-11-23 DIAGNOSIS — C801 Malignant (primary) neoplasm, unspecified: Secondary | ICD-10-CM

## 2022-11-23 DIAGNOSIS — R519 Generalized headaches: Secondary | ICD-10-CM

## 2022-11-23 DIAGNOSIS — Z789 Other specified health status: Secondary | ICD-10-CM

## 2022-11-23 DIAGNOSIS — Z8249 Family history of ischemic heart disease and other diseases of the circulatory system: Secondary | ICD-10-CM

## 2022-11-23 DIAGNOSIS — F419 Anxiety disorder, unspecified: Secondary | ICD-10-CM

## 2022-11-23 DIAGNOSIS — Z85850 Personal history of malignant neoplasm of thyroid: Secondary | ICD-10-CM

## 2022-11-23 DIAGNOSIS — W19XXXA Unspecified fall, initial encounter: Secondary | ICD-10-CM

## 2022-11-23 DIAGNOSIS — M5030 Other cervical disc degeneration, unspecified cervical region: Secondary | ICD-10-CM

## 2022-11-23 DIAGNOSIS — R32 Unspecified urinary incontinence: Secondary | ICD-10-CM

## 2022-11-23 DIAGNOSIS — IMO0002 Ulcer: Secondary | ICD-10-CM

## 2022-11-23 DIAGNOSIS — M4316 Spondylolisthesis, lumbar region: Secondary | ICD-10-CM

## 2022-11-23 DIAGNOSIS — K219 Gastro-esophageal reflux disease without esophagitis: Secondary | ICD-10-CM

## 2022-11-23 DIAGNOSIS — E785 Hyperlipidemia, unspecified: Secondary | ICD-10-CM

## 2022-11-23 DIAGNOSIS — M810 Age-related osteoporosis without current pathological fracture: Secondary | ICD-10-CM

## 2022-11-23 DIAGNOSIS — M4800 Spinal stenosis, site unspecified: Secondary | ICD-10-CM

## 2022-11-23 DIAGNOSIS — M5134 Other intervertebral disc degeneration, thoracic region: Secondary | ICD-10-CM

## 2022-11-23 DIAGNOSIS — F3289 Other specified depressive episodes: Secondary | ICD-10-CM

## 2022-11-23 DIAGNOSIS — B999 Unspecified infectious disease: Secondary | ICD-10-CM

## 2022-11-23 DIAGNOSIS — M549 Dorsalgia, unspecified: Secondary | ICD-10-CM

## 2022-11-23 DIAGNOSIS — E039 Hypothyroidism, unspecified: Secondary | ICD-10-CM

## 2022-11-23 DIAGNOSIS — F32A Depression: Secondary | ICD-10-CM

## 2022-11-23 DIAGNOSIS — E890 Postprocedural hypothyroidism: Secondary | ICD-10-CM

## 2022-11-23 DIAGNOSIS — R011 Cardiac murmur, unspecified: Secondary | ICD-10-CM

## 2022-11-23 DIAGNOSIS — I10 Essential (primary) hypertension: Secondary | ICD-10-CM

## 2022-11-23 DIAGNOSIS — D539 Nutritional anemia, unspecified: Secondary | ICD-10-CM

## 2022-11-23 DIAGNOSIS — M47816 Spondylosis without myelopathy or radiculopathy, lumbar region: Secondary | ICD-10-CM

## 2022-11-23 DIAGNOSIS — C50919 Malignant neoplasm of unspecified site of unspecified female breast: Secondary | ICD-10-CM

## 2022-11-23 DIAGNOSIS — R768 Other specified abnormal immunological findings in serum: Secondary | ICD-10-CM

## 2022-11-23 DIAGNOSIS — M2550 Pain in unspecified joint: Secondary | ICD-10-CM

## 2022-11-23 NOTE — Progress Notes
[facility] CLINIC NOTE       SUBJECTIVE:   Cindy Randolph is a 62 y.o.-year-old female with history of hypertension, thyroid disease, and hyperlipidemia, who presents for scheduled follow up for worsening low back and left leg pain.  The patient was last seen in clinic on 08/06/2022. At that time, she was recommended to continue with duloxetine.  She was also recommended to continue with formal physical therapy efforts.  Discussed possible MRI if her pain were to worsen.  Patient reports she has been participating in an exercise class.  She reports 2 movements that have exacerbated her pain.  She reports pushing a sled with a 45 pound weight on it as well as doing back extensions.  Patient reports pain is in the low back and radiates to the bilateral gluteal muscles.  It does radiate down the left leg as well.  She endorses some numbness and tingling in the left leg.  She has had radiographs of lumbar spine but has not had advanced imaging of the lumbar spine.  She has previously undergone a lumbar radiofrequency ablation more than 5 years ago that helped the symptoms previously.  She has not had any epidurals recently.  VAS pain score is a 6 out of 10 today.  She is interested in additional treatment options at this time for her lower back and left leg pain. Denies loss of bowel or bladder function.  Denies recent falls.        Review of Systems    Current Outpatient Medications:     acetaminophen (TYLENOL) 325 mg tablet, Take two tablets by mouth every 6 hours. Take scheduled for 3 days after surgery, then as needed. Do not exceed 4,000mg  in a 24 hour period., Disp: 40 tablet, Rfl: 0    amLODIPine (NORVASC) 5 mg tablet, Take one tablet by mouth daily., Disp: 90 tablet, Rfl: 3    aspirin 81 mg chewable tablet, Chew one tablet by mouth daily., Disp: 90 tablet, Rfl: 0    biotin 1 mg cap, Take one capsule by mouth daily., Disp: , Rfl:     CALCIUM PO, Take 600 mg by mouth daily., Disp: , Rfl: CHOLEcalciferoL (vitamin D3) 1,000 units tablet, Take one tablet by mouth daily. Takes 5000 units, Disp: , Rfl:     clobetasoL (TEMOVATE) 0.05 % topical ointment, Apply  topically to affected area twice daily. Apply to areas of rash on hands twice daily; do NOT use on face/groin/underarms, use up to 2 weeks per month, Disp: 60 g, Rfl: 3    diclofenac sodium (VOLTAREN) 1 % topical gel, Apply four g topically to affected area daily as needed., Disp: , Rfl:     escitalopram oxalate (LEXAPRO) 20 mg tablet, Take one-half tablet by mouth daily., Disp: , Rfl:     ezetimibe (ZETIA) 10 mg tablet, Take one tablet by mouth daily., Disp: 90 tablet, Rfl: 3    Fish,Bora,Flax Oils-OM3,6,9 #1 (TRIPLE OMEGA) 400-400-400 mg capsule, Take one capsule by mouth daily., Disp: , Rfl:     gabapentin (NEURONTIN) 100 mg capsule, Take one capsule by mouth at bedtime daily., Disp: 90 capsule, Rfl: 0    metoprolol succinate XL (TOPROL XL) 25 mg extended release tablet, Take one tablet by mouth at bedtime daily., Disp: 90 tablet, Rfl: 3    omeprazole DR (PRILOSEC) 40 mg capsule, Take one capsule by mouth daily., Disp: 90 capsule, Rfl: 1    other medication, K Complete K1 & K2 as MK-4 & MK-7 :  Take 1 softgel by mouth once daily, Disp: , Rfl:     rosuvastatin (CRESTOR) 10 mg tablet, Take one tablet by mouth daily., Disp: 90 tablet, Rfl: 3    Selenium 100 mcg tab, Take one tablet by mouth daily., Disp: , Rfl:     SYNTHROID 100 mcg tablet, Take one tablet by mouth daily 30 minutes before breakfast., Disp: 90 tablet, Rfl: 3    traMADoL 25 mg tablet, Take one tablet by mouth every 12 hours as needed., Disp: 30 tablet, Rfl: 0    vitamins, B complex tab, Take one tablet by mouth daily., Disp: , Rfl:     vitamins, multiple cap, Take one capsule by mouth daily., Disp: , Rfl:     zinc sulfate 220 mg (50 mg elemental zinc) capsule, Take one capsule by mouth daily., Disp: , Rfl:   Allergies   Allergen Reactions    Mango ANAPHYLAXIS    Other [Unclassified Drug] ANAPHYLAXIS     DUCK Meat and Eggs     Imdur [Isosorbide Mononitrate] CHEST TIGHTNESS    Iodinated Contrast Media RASH     She developed rash on back and neck after hospitalization 06/2022 where she underwent heart cath. Unsure if contrast was cause of rash, but it developed shortly after procedure.    Oxycodone NAUSEA ONLY     Prefers tramadol    Sudafed [Pseudoephedrine Hcl] PALPITATIONS     Physical Exam  Vitals:    11/23/22 1301   BP: 128/88   BP Source: Arm, Right Upper   Pulse: 70   SpO2: 97%   PainSc: Six   Weight: 74 kg (163 lb 2.3 oz)   Height: 157.5 cm (5' 2)     Oswestry Total Score:: 50  Pain Score: Six  Body mass index is 29.84 kg/m?Marland Kitchen  General: 62 y.o. female appears stated age, in no acute distress  HEENT: Normocephalic, atraumatic  Neck: No thyroidmegaly  Cardiovascular: Well perfused  Pulmonary: Unlabored respirations  Extremities: No cyanosis, clubbing, or edema  Skin: Warm and dry  Psychiatric:  Appropriate mood and affect  Musculoskeletal: Full range of motion with lumbar flexion, decreased with extension, and lateral rotation.  Tender to palpation at lower lumbar facets, PSIS, gluteal musculature, and greater trochanter bursa. Facet loading is positive bilaterally.    Neurologic: Weakness with left EHL 4/5 otherwise, Lower extremity myotomes are all 5/5.  Lower extremity dermatomes are all intact to light touch.  Deep tendon reflexes are symmetric at patella and achilles.  Slump test positive on the left, negative on the right.  No ankle clonus.     Radiographs of the lumbar spine performed on 08/02/2022 were personally reviewed and demonstrated grade 1 anterior listhesis at L3-L4.  There is severe disc disease and to space narrowing at L3-L4.  There is lumbar facet arthropathy.  No acute fractures.       IMPRESSION:  1. Lumbar radiculopathy    2. Lumbar facet arthropathy    3. Spondylolisthesis of lumbar region      PLAN:    1.  Lifestyle modifications.  Recommend activity as tolerated. Avoid provocative maneuvers.  Keep spine in neutral position.  2.  Medications.  No changes indicated at this time.  Continue medications as previously prescribed.  3.  Therapy.  She may continue with formal therapy.  I discussed with her modifying her exercise to avoid anything that makes her pain worse several hours later into the next day.  I would avoid weight lifting in which  she cannot keep her spine in neutral position.  4.  Imaging.  Ordered MRI of lumbar spine.   5.  Interventions.  May consider pending MRI results.  6.  Follow-up.  Patient to follow-up after MRI with Dr. Noralyn Pick.     Total Time Today was 32 minutes in the following activities: Preparing to see the patient, Obtaining and/or reviewing separately obtained history, Performing a medically appropriate examination and/or evaluation, Counseling and educating the patient/family/caregiver, Ordering medications, tests, or procedures and Documenting clinical information in the electronic or other health record.

## 2022-11-24 DIAGNOSIS — M5416 Radiculopathy, lumbar region: Secondary | ICD-10-CM

## 2022-11-26 ENCOUNTER — Encounter: Admit: 2022-11-26 | Discharge: 2022-11-26 | Payer: MEDICARE

## 2022-11-29 ENCOUNTER — Encounter: Admit: 2022-11-29 | Discharge: 2022-11-29 | Payer: MEDICARE

## 2022-12-06 ENCOUNTER — Encounter: Admit: 2022-12-06 | Discharge: 2022-12-06 | Payer: MEDICARE

## 2022-12-06 DIAGNOSIS — K219 Gastro-esophageal reflux disease without esophagitis: Secondary | ICD-10-CM

## 2022-12-06 DIAGNOSIS — E039 Hypothyroidism, unspecified: Secondary | ICD-10-CM

## 2022-12-06 DIAGNOSIS — Z8249 Family history of ischemic heart disease and other diseases of the circulatory system: Secondary | ICD-10-CM

## 2022-12-06 DIAGNOSIS — M2550 Pain in unspecified joint: Secondary | ICD-10-CM

## 2022-12-06 DIAGNOSIS — M810 Age-related osteoporosis without current pathological fracture: Secondary | ICD-10-CM

## 2022-12-06 DIAGNOSIS — C50919 Malignant neoplasm of unspecified site of unspecified female breast: Secondary | ICD-10-CM

## 2022-12-06 DIAGNOSIS — M549 Dorsalgia, unspecified: Secondary | ICD-10-CM

## 2022-12-06 DIAGNOSIS — C801 Malignant (primary) neoplasm, unspecified: Secondary | ICD-10-CM

## 2022-12-06 DIAGNOSIS — R32 Unspecified urinary incontinence: Secondary | ICD-10-CM

## 2022-12-06 DIAGNOSIS — R519 Generalized headaches: Secondary | ICD-10-CM

## 2022-12-06 DIAGNOSIS — I10 Essential (primary) hypertension: Secondary | ICD-10-CM

## 2022-12-06 DIAGNOSIS — C50112 Malignant neoplasm of central portion of left female breast: Secondary | ICD-10-CM

## 2022-12-06 DIAGNOSIS — M5030 Other cervical disc degeneration, unspecified cervical region: Secondary | ICD-10-CM

## 2022-12-06 DIAGNOSIS — Z789 Other specified health status: Secondary | ICD-10-CM

## 2022-12-06 DIAGNOSIS — S83209A Unspecified tear of unspecified meniscus, current injury, unspecified knee, initial encounter: Secondary | ICD-10-CM

## 2022-12-06 DIAGNOSIS — Z9189 Other specified personal risk factors, not elsewhere classified: Secondary | ICD-10-CM

## 2022-12-06 DIAGNOSIS — M4800 Spinal stenosis, site unspecified: Secondary | ICD-10-CM

## 2022-12-06 DIAGNOSIS — M5134 Other intervertebral disc degeneration, thoracic region: Secondary | ICD-10-CM

## 2022-12-06 DIAGNOSIS — F419 Anxiety disorder, unspecified: Secondary | ICD-10-CM

## 2022-12-06 DIAGNOSIS — R011 Cardiac murmur, unspecified: Secondary | ICD-10-CM

## 2022-12-06 DIAGNOSIS — IMO0002 Ulcer: Secondary | ICD-10-CM

## 2022-12-06 DIAGNOSIS — M5136 Other intervertebral disc degeneration, lumbar region: Secondary | ICD-10-CM

## 2022-12-06 DIAGNOSIS — W19XXXA Unspecified fall, initial encounter: Secondary | ICD-10-CM

## 2022-12-06 DIAGNOSIS — F3289 Other specified depressive episodes: Secondary | ICD-10-CM

## 2022-12-06 DIAGNOSIS — E890 Postprocedural hypothyroidism: Secondary | ICD-10-CM

## 2022-12-06 DIAGNOSIS — Z9013 Acquired absence of bilateral breasts and nipples: Secondary | ICD-10-CM

## 2022-12-06 DIAGNOSIS — E785 Hyperlipidemia, unspecified: Secondary | ICD-10-CM

## 2022-12-06 DIAGNOSIS — F32A Depression: Secondary | ICD-10-CM

## 2022-12-06 DIAGNOSIS — C73 Malignant neoplasm of thyroid gland: Secondary | ICD-10-CM

## 2022-12-06 DIAGNOSIS — E079 Disorder of thyroid, unspecified: Secondary | ICD-10-CM

## 2022-12-06 DIAGNOSIS — Z85850 Personal history of malignant neoplasm of thyroid: Secondary | ICD-10-CM

## 2022-12-06 DIAGNOSIS — D539 Nutritional anemia, unspecified: Secondary | ICD-10-CM

## 2022-12-06 DIAGNOSIS — E7800 Pure hypercholesterolemia, unspecified: Secondary | ICD-10-CM

## 2022-12-06 DIAGNOSIS — Z923 Personal history of irradiation: Secondary | ICD-10-CM

## 2022-12-06 DIAGNOSIS — B999 Unspecified infectious disease: Secondary | ICD-10-CM

## 2022-12-06 NOTE — Progress Notes
Bioimpedance Spectroscopy performed.  Advised patient that additional information will be sent via Mychart (preferred) or phone if indicated by the lymphedema nurse within 24 hours.

## 2022-12-06 NOTE — Progress Notes
Name: Cindy Randolph          MRN: [id number]      DOB: Sep 07, 1960      AGE: 62 y.o.   DATE OF SERVICE: 12/06/2022           Reason for Visit:  Heme/Onc Care      Cindy Randolph is a 62 y.o. female.      Cancer Staging   Malignant neoplasm of left breast in female, estrogen receptor positive (HCC)  Staging form: Breast, AJCC 8th Edition  - Clinical stage from 12/22/2020: Stage IA (cT1c, cN0, cM0, G1, ER+, PR+, HER2-) - Signed by Massie Kluver, MD on 12/22/2020  - Pathologic stage from 02/02/2021: Stage IIIA (pT3, pN3, cM0, G1, ER+, PR+, HER2-) - Signed by Massie Kluver, MD on 05/27/2021    DIAGNOSIS:  Left grade 1 ILC (ER  96%, PR 10%, HER2 0, Ki-67 3%) at 12:00 dx 11/2020    History of Present Illness     Cindy Randolph presents to clinic today for routine 2 year follow up. She is doing well today and has no focal breast  concerns.  She does report concern about recent weight gain, headaches, pelvic pain and arthritis.     HISTORY:  Cindy Randolph is a female who presented to the [facility] on 12/24/2020 at age 61 for left breast cancer.  She reports noticing Left breast intermittent discomfort over the last 2 months.  She had Screening mammogram at [facility] on 11/25/2020 which identified a focal asymmetry in the left breast.  She returned for left diagnostic mammgoram and ultrasound on 12/04/2020 which showed an ill-defined hypoechoic area at 12:00 7 cm FTN measuring 1.7 cm.   She underwent Left ultrasound guided biopsy on 12/10/2020 which revealed grade 1 hormone positive, HER2 negative ILC with associated LCIS. She has no breast complaints.  She proceeded with Bilateral total mastectomy/Left SLNB with oncoplastic flat closure on 01/22/2021.  Final surgical pathology revealed multifocal grade 1 ILC that merge to measuring 7.4 cm with associated LCIS, clear margins and 5/5 lymph nodes. The right breast is benign.   Adjuvant chemotherapy and cALND were recommended.  She completed adjuvant chemotherapy of dd AC from 10/3 to 04/06/2021, taxol was recommended but she declined.  She returned for Left completion ALND on 05/14/2021.  Final surgical pathology revealed 6/14 lymph nodes positive. She finished radiation with Dr. Haskel Khan on 07/01/21.    BREAST IMAGING:  Mammogram:    - Screening mammogram 11/25/2020 ([facility]) revealed scattered fibroglandular tissue density.  Benign appearing microcalcification. Unchanged right upper central anterior breast low density circumscribed masses dating back to 2013.  Focal asymmetry located within the left posterior central breast, 12:00, 7.3 cm FTN most conspicuous on MLO.  Recommend spot compression.    - Left diagnostic mammogram 12/04/2020 ([facility]) revealed irregular density at 12:00 left breast persists.  Ultrasound recommended.      Ultrasound:    Left targeted ultrasound 12/04/2020 ([facility]) revealed an ill-defined area which was slightly hypoechoic at 12:00, 7 cm FTN.  This was indeterminate and biopsy was recommended.     MRI:    - Breast MRI 12/26/2020 ([place]) The breast tissue is scattered areas of fibroglandular tissue.   There is mild background parenchymal enhancement. Left breast: There are multiple faint discontiguous irregular enhancing masses and discontiguous intervening nonmass enhancement involving predominantly the upper and inner left breast from anterior to posterior depth measuring in aggregate 6.3 cm AP by 3.6  cm transverse by 6.4 cm craniocaudal. Several areas demonstrate persistent enhancement kinetics. The artifact from tissue marker clips (ribbon and heart) at the site of biopsy demonstrating malignancy are seen within the upper left breast at the 12:00 position, middle depth (image 80). A representative discontiguous irregular enhancing mass is seen within the inner left breast at the 9:00 position, middle depth as on image 111, which demonstrates a mammographic correlate on outside CC tomosynthesis slice 18. Anterior extent of abnormal enhancement extends to the base of the left nipple. Posterior extent of abnormal enhancement is approximately 2.1 cm anterior to the underlying pectoralis muscle. There are greater than 5 small round morphologically abnormal and asymmetric level 1 left axillary lymph nodes. No suspicious level 2 or level 3 lymph nodes are seen. No suspicious internal mammary lymph nodes are seen. Right breast: No suspicious mass or nonmass enhancement is seen within the right breast. No suspicious right axillary or internal mammary lymph nodes are identified. Incidental Findings: None     REPRODUCTIVE HEALTH:  Age at first Menarche:  18  Age at First Live Birth:  41  Age at Menopause:  36  Gravida:  8  Para: 8  Breastfeeding:  yes    PROCEDURE:  Bilateral Total mastectomy/Left SLNB oncoplastic flat closure 01/22/2021 ([facility])  2. Left ALND 05/14/2021 [facility])  PATHOLOGY: multifocal grade 1 ILC that merge to measuring 7.4 cm with associated LCIS, clear margins and 5/5 lymph nodes. The right breast is benign.   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  No family history of ovarian or prostate cancer.   MEDICAL ONCOLOGY:    Dr. Neil Crouch  transferred to Dr. Donneta Romberg Adjuvant chemotherapy:  ddACx 4 completed 04/06/2021, Taxol recommended but declined;   Present therapy:  Letrozole/Verzenio  REFERRED BY:  Dr. Erskine Emery       Review of Systems      Allergies   Allergen Reactions    Mango ANAPHYLAXIS    Other [Unclassified Drug] ANAPHYLAXIS     DUCK Meat and Eggs     Imdur [Isosorbide Mononitrate] CHEST TIGHTNESS    Iodinated Contrast Media RASH     She developed rash on back and neck after hospitalization 06/2022 where she underwent heart cath. Unsure if contrast was cause of rash, but it developed shortly after procedure.    Oxycodone NAUSEA ONLY     Prefers tramadol    Sudafed [Pseudoephedrine Hcl] PALPITATIONS     The following medical/surgical/family/social history and the list of medications are current, as of 12/06/2022    Past Medical History:   Diagnosis Date    Accidental fall 02/23/22    Knee gave out and fell down stairs    Acquired hypothyroidism     Anxiety 2016    from pain    Back pain     Breast cancer in female [facility]) 12/2020    Left    Cancer of thyroid (HCC) 1980    Degenerative disc disease, cervical 1994    Degenerative disc disease, lumbar 2010    Degenerative disc disease, thoracic 2010    Depression situational, transient    after divorce, after death of 2nd husband    Depressive disorder, not elsewhere classified 7/23    Now on Lexapro; it is effective    Essential hypertension began in 3rd trimester most pregnancies    remained after last pregnancy    Essential hypertension, benign as above    Family  history of coronary artery disease in brother 06/17/2022    04/01/2022- Per OV Dr. Maisie Fus     Generalized headaches 1996-2010    probably hormonally related    GERD (gastroesophageal reflux disease)     H/O total thyroidectomy 05/24/1978    Heart murmur at birth    Dr. Maisie Fus said he didn't hear it a few years ago    High cholesterol     History of bilateral mastectomy 06/22/2022    History of external beam radiation therapy 07/15/2021    History of thyroid cancer 06/22/2022    TSH 0.39 and pt not taking med regularly until about 2 weeks ago.  Will have her repeat TSH in 3 weeks and f/u after.  Needs to be between 0.1 and 0.3.  Will refer her back to Endo for f/u.    Incontinence 3/23    With coughing and sneezing and strong sudden mivemwnts    Infection     Joint pain 1994    Limb alert care status     No access on R-arm    Osteoporosis 03/2021    Bilat hips    Other and unspecified hyperlipidemia 11/23    Will see cardiologist early 2024    Other malignant neoplasm without specification of site thyroid, 1980    thyroidectomy    Spinal stenosis 2017 to present    per MRIs    Thyroid disorder as noted    Torn meniscus 12/25/2020    Ulcer 1987    with divorce; resolved    Unspecified deficiency anemia 2009    from excessive bleeding post-partum; treated iron     Surgical History:   Procedure Laterality Date    HX WRIST FRACTURE SURGERY  1993    Baker's thumb with fixation    ARTHROPLASTY  2001    L ACL    COLONOSCOPY  2017    normal    ARTHROSCOPY KNEE WITH PARTIAL LATERAL MENISCECTOMY AND LEFT KNEE INJECTION. Right 01/08/2021    Performed by Tanja Port, MD at [facility] OR    BILATERAL TOTAL MASTECTOMIES Bilateral 01/22/2021    Performed by Massie Kluver, MD at [facility] OR    INTRAOPERATIVE SENTINEL LYMPH NODE IDENTIFICATION WITH/ WITHOUT NON-RADIOACTIVE DYE INJECTION Left 01/22/2021    Performed by Massie Kluver, MD at [facility] OR    INJECTION RADIOACTIVE TRACER FOR SENTINEL NODE IDENTIFICATION Left 01/22/2021    Performed by Massie Kluver, MD at [facility] OR    LEFT AXILLARY SENTINEL LYMPH NODE BIOPSY Left 01/22/2021    Performed by Massie Kluver, MD at [facility]    BILATERAL CHEST FLAT CLOSURE Bilateral 01/22/2021    Performed by Stevenson Clinch, MD at [facility] OR    BILATERAL CHEST FLAT CLOSURE x 8 Bilateral 01/22/2021    Performed by Stevenson Clinch, MD at [facility] OR    EXCISION BENIGN LESION 0.5 CM OR LESS - TORSO Right 01/22/2021    Performed by Stevenson Clinch, MD at [facility] OR    TUNNELED VENOUS PORT PLACEMENT Right 02/19/2021    Placement of port-a-cath - 84F Right 02/19/2021    Performed by Freund, Alecia Lemming., MD at [facility]    FLUOROSCOPIC GUIDANCE CENTRAL VENOUS ACCESS DEVICE PLACEMENT/ REPLACEMENT/ REMOVAL N/A 02/19/2021    Performed by Carloyn Manner., MD at [facility] OR    Left Completion Axillary Lymph Node Dissection Left 05/14/2021    Performed by Stanton Kidney  R, MD at [facility] OR    REMOVAL TUNNELED CENTRAL VENOUS ACCESS DEVICE INCLUDING PORT/ PUMP Right 05/14/2021    Performed by Massie Kluver, MD at [facility]    COLONOSCOPY DIAGNOSTIC WITH SPECIMEN COLLECTION BY BRUSHING/ WASHING - FLEXIBLE N/A 10/14/2021    Performed by Benetta Spar, MD at [facility]    ESOPHAGOGASTRODUODENOSCOPY WITH SPECIMEN COLLECTION BY BRUSHING/ WASHING N/A 06/17/2022    Performed by Remigio Eisenmenger, MD at [facility] ENDO    ESOPHAGOGASTRODUODENOSCOPY WITH DILATION ESOPHAGUS WITH BALLOON 30 MM OR GREATER - FLEXIBLE N/A 06/17/2022    Performed by Remigio Eisenmenger, MD at [facility] ENDO    ANGIOGRAPHY CORONARY ARTERY WITH LEFT HEART CATHETERIZATION N/A 07/16/2022    Performed by Harley Alto, MD at [facility] CATH LAB    PERCUTANEOUS CORONARY STENT PLACEMENT WITH ANGIOPLASTY N/A 07/16/2022    Performed by Harley Alto, MD at [facility] CATH LAB    ACL RECONSTRUCTION  06/03/1999    BREAST SURGERY  01/24/21    Bilat mastectomy    ECHOCARDIOGRAM PROCEDURE      FRACTURE SURGERY  1993    ORIF L 1st metacarpal    HX CARPAL TUNNEL RELEASE  1996    With second and other oregbnancies    KNEE SURGERY  L ACL as above    LYMPH NODE DISSECTION  12/22    Left    THYROIDECTOMY       Family History   Problem Relation Name Age of Onset    Arthritis Paternal Grandmother Ted Mcalpine         multiple heberdens nodules and deformities    Back pain Paternal Grandmother Ted Mcalpine     Arthritis-osteo Paternal Grandmother Ted Mcalpine     Diabetes Paternal Grandmother Ted Mcalpine         Type 2 as older adult    Arthritis Mother Carmina Miller         wear and tear    Back pain Mother Carmina Miller     Hypertension Mother Carmina Miller     Joint Pain Mother Carmina Miller     Neck Pain Mother Carmina Miller     Cancer-Breast Mother Carmina Miller 64        was on HRT for 10-15 years    Cancer Mother Carmina Miller         Breast CA, post menopausal, estrogen-sensitive    Miscarriages Mother Carmina Miller         3-5 and a stillbirth    Basal Cell Carcinoma Brother Onalee Hua (Brother)     Back pain Brother Onalee Hua (Brother)         Has had multiple laminectomy surgeries    Hypertension Brother Onalee Hua (Brother)     Asthma Brother Onalee Hua (Brother)         Worst when a child    Basal Cell Carcinoma Brother Chip     Back pain Brother Chip Arthritis    Hypertension Brother Chip     Basal Cell Carcinoma Brother Isaias Cowman     Back pain Brother Isaias Cowman         Arthritis    Hypertension Brother Isaias Cowman     Heart problem Brother Isaias Cowman         Stent in LAD, positive apo(a)    Diabetes Father Alinda Dooms         Type 2    Heart problem Father Alinda Dooms         CABG 3  Heart Disease Father Alinda Dooms     Hypertension Father Alinda Dooms     Alcohol abuse Father Alinda Dooms     Diabetes Maternal Grandfather Elam Dutch         Type 2 as older adult    Birth Defect Daughter Rhoderick Moody         PRS, congenital diaphragmatic hernia, malrotation, grey matter heterotopia    Stroke Maternal Uncle Jillyn Hidden         In his 62s    Thyroid Disease Maternal Uncle Jillyn Hidden     Diabetes Paternal Grandfather Grandpa     Birth Defect Nephew          craniosynostosis    Thyroid Disease Maternal Uncle Visual merchandiser     Cancer-Breast Maternal Great-Aunt  49     Social History     Socioeconomic History    Marital status: Married    Number of children: 8   Occupational History    Occupation: Retired Engineer, civil (consulting)     Comment: Worked in [employer] and Hospice; also worked in the ambulatory setting   Tobacco Use    Smoking status: Never     Passive exposure: Past    Smokeless tobacco: Never    Tobacco comments:     Significant second hand smoke from parents 14-40 years old   Vaping Use    Vaping status: Never Used   Substance and Sexual Activity    Alcohol use: Not Currently     Comment: Prior to 2020, socially, one drink once a week at most    Drug use: Never     Comment: in late teens, early twenties, a few times and not since    Sexual activity: Yes     Partners: Male     Birth control/protection: Post-menopausal, None       Objective:          acetaminophen (TYLENOL) 325 mg tablet Take two tablets by mouth every 6 hours. Take scheduled for 3 days after surgery, then as needed. Do not exceed 4,000mg  in a 24 hour period.    amLODIPine (NORVASC) 5 mg tablet Take one tablet by mouth daily.    aspirin 81 mg chewable tablet Chew one tablet by mouth daily.    biotin 1 mg cap Take one capsule by mouth daily.    CALCIUM PO Take 600 mg by mouth daily.    CHOLEcalciferoL (vitamin D3) 1,000 units tablet Take one tablet by mouth daily. Takes 5000 units    clobetasoL (TEMOVATE) 0.05 % topical ointment Apply  topically to affected area twice daily. Apply to areas of rash on hands twice daily; do NOT use on face/groin/underarms, use up to 2 weeks per month    diclofenac sodium (VOLTAREN) 1 % topical gel Apply four g topically to affected area daily as needed.    escitalopram oxalate (LEXAPRO) 20 mg tablet Take one-half tablet by mouth daily.    ezetimibe (ZETIA) 10 mg tablet Take one tablet by mouth daily.    Fish,Bora,Flax Oils-OM3,6,9 #1 (TRIPLE OMEGA) 400-400-400 mg capsule Take one capsule by mouth daily.    gabapentin (NEURONTIN) 100 mg capsule Take one capsule by mouth at bedtime daily.    metoprolol succinate XL (TOPROL XL) 25 mg extended release tablet Take one tablet by mouth at bedtime daily.    omeprazole DR (PRILOSEC) 40 mg capsule Take one capsule by mouth daily.    other medication K Complete K1 & K2 as MK-4 &  MK-7 : Take 1 softgel by mouth once daily    rosuvastatin (CRESTOR) 10 mg tablet Take one tablet by mouth daily.    Selenium 100 mcg tab Take one tablet by mouth daily.    SYNTHROID 100 mcg tablet Take one tablet by mouth daily 30 minutes before breakfast.    traMADoL 25 mg tablet Take one tablet by mouth every 12 hours as needed.    vitamins, B complex tab Take one tablet by mouth daily.    vitamins, multiple cap Take one capsule by mouth daily.    zinc sulfate 220 mg (50 mg elemental zinc) capsule Take one capsule by mouth daily.     Vitals:    12/06/22 1426   BP: (!) 129/91   BP Source: Arm, Left Upper   Pulse: 77   Temp: 36.7 ?C (98.1 ?F)   Resp: 16   SpO2: 96%   TempSrc: Temporal   PainSc: Six   Weight: 74.5 kg (164 lb 3.2 oz)   Height: 157.5 cm (5' 2)     Body mass index is 30.03 kg/m?Marland Kitchen     Pain Score: Six  Pain Loc: Head (Both hands and neck)    Fatigue Scale: 8    Pain Addressed:  N/A    Patient Evaluated for a Clinical Trial: No treatment clinical trial available for this patient.     [place] Cooperative Oncology Group performance status is 0, Fully active, able to carry on all pre-disease performance without restriction.Marland Kitchen     Physical Exam  Vitals reviewed.   Chest:              RIGHT BREAST EXAM:  Breast:  Exam consistent with mastectomy without reconstruction.  No skin abnormality, no palpable masses or nodules.    Skin Erythema:  No  Attachment of Overlying Skin:  No  Peau d' orange:  No  Chest Wall Attachment:  No      LEFT BREAST EXAM:  Breast: Exam consistent with mastectomy without reconstruction.  No skin abnormality, no palpable masses or nodules.    Skin Erythema:  No  Attachment of Overlying Skin:  No  Peau d' orange:  No  Chest Wall Attachment: No      RIGHT NODAL BASIN EXAM:  Axillary:  negative  Infraclavicular:  negative  Supraclavicular:  negative    LEFT NODAL BASIN EXAM:  Axillary:  negative  Infraclavicular: negative  Supraclavicular:  negative    Constitutional: No acute distress.  HEENT:  Head: Normocephalic and atraumatic.  Eyes: No discharge. No scleral icterus.  Pulmonary/Chest: No respiratory distress.   Neurological: Alert and oriented to person, place and time. No cranial nerve deficit.  Skin: Warm and dry. No rash noted. No erythema. No pallor.  Psychiatric: Normal mood and affect. Behavior is normal. Judgement and thought content normal.       Assessment and Plan:  DIAGNOSIS:  Left grade 1 ILC (ER  96%, PR 10%, HER2 0, Ki-67 3%) at 12:00 dx 11/2020- NED 2 years      Cindy Randolph presents to clinic today for routine follow up. She has no evidence of local recurrence.   She continues to follow with Dr.  Donneta Romberg for medical oncology.  We discussed her concerns, she was encouraged to reach out to Dr. Dorise Hiss office to make sure they are aware of headaches and pelvic pain.  She is scheduled to see rheumatology regarding arthritis pain this fall.  She will discuss potential use of NSAIDs with cardiology as  she believes this why they were stopped.  She had a BIS measurement today and will be contacted by lymphedema clinic with results.  We discussed follow up planning, she will no longer follow in breast surgery clinic.  She was given ample time to ask questions all of which were answered to her satisfaction. She was given direct contact information and encouraged to call or utilize MyChart with any interval questions or concerns.       Continue following with Dr. Donneta Romberg  Follow up with PCP and specialists  Return to clinic prn    Nigel Berthold, PA-C

## 2022-12-06 NOTE — Progress Notes
Lymphedema Prevention Bioimpedance Spectroscopy (BIS) Monitoring    Notified patient via MyChart result was normal and to continue with routine follow up as scheduled.  Provided clinic contact information for any questions or concerns.     Reviewed BIS testing results from today.  Results:  LEFT  Current: 5.2  Baseline: 2.0  Change from Baseline: 3.2    WNL less than 3 standard deviation increase from baseline.

## 2022-12-10 ENCOUNTER — Encounter: Admit: 2022-12-10 | Discharge: 2022-12-10 | Payer: MEDICARE

## 2022-12-15 ENCOUNTER — Encounter: Admit: 2022-12-15 | Discharge: 2022-12-15 | Payer: MEDICARE

## 2022-12-15 DIAGNOSIS — R102 Pelvic and perineal pain: Secondary | ICD-10-CM

## 2022-12-16 ENCOUNTER — Encounter: Admit: 2022-12-16 | Discharge: 2022-12-16 | Payer: MEDICARE

## 2022-12-16 DIAGNOSIS — R32 Unspecified urinary incontinence: Secondary | ICD-10-CM

## 2022-12-16 DIAGNOSIS — R102 Pelvic and perineal pain: Secondary | ICD-10-CM

## 2022-12-16 DIAGNOSIS — R519 Acute nonintractable headache, unspecified headache type: Secondary | ICD-10-CM

## 2022-12-16 DIAGNOSIS — Z91041 Radiographic dye allergy status: Secondary | ICD-10-CM

## 2022-12-16 MED ORDER — DIPHENHYDRAMINE HCL 25 MG PO TAB
50 mg | ORAL_TABLET | Freq: Once | ORAL | 0 refills | 6.00000 days | Status: AC
Start: 2022-12-16 — End: ?

## 2022-12-16 MED ORDER — METHYLPREDNISOLONE 32 MG PO TAB
32 mg | ORAL_TABLET | ORAL | 0 refills | Status: AC
Start: 2022-12-16 — End: ?

## 2022-12-23 ENCOUNTER — Encounter: Admit: 2022-12-23 | Discharge: 2022-12-23 | Payer: MEDICARE

## 2022-12-24 ENCOUNTER — Encounter: Admit: 2022-12-24 | Discharge: 2022-12-24 | Payer: MEDICARE

## 2022-12-24 DIAGNOSIS — M19041 Primary osteoarthritis, right hand: Secondary | ICD-10-CM

## 2022-12-27 ENCOUNTER — Encounter: Admit: 2022-12-27 | Discharge: 2022-12-27 | Payer: MEDICARE

## 2022-12-27 ENCOUNTER — Ambulatory Visit: Admit: 2022-12-27 | Discharge: 2022-12-27 | Payer: MEDICARE

## 2022-12-27 DIAGNOSIS — M5416 Radiculopathy, lumbar region: Secondary | ICD-10-CM

## 2022-12-27 DIAGNOSIS — E039 Hypothyroidism, unspecified: Secondary | ICD-10-CM

## 2022-12-27 DIAGNOSIS — R52 Pain, unspecified: Secondary | ICD-10-CM

## 2022-12-27 DIAGNOSIS — Z8249 Family history of ischemic heart disease and other diseases of the circulatory system: Secondary | ICD-10-CM

## 2022-12-27 DIAGNOSIS — M5134 Other intervertebral disc degeneration, thoracic region: Secondary | ICD-10-CM

## 2022-12-27 DIAGNOSIS — E7800 Pure hypercholesterolemia, unspecified: Secondary | ICD-10-CM

## 2022-12-27 DIAGNOSIS — R32 Unspecified urinary incontinence: Secondary | ICD-10-CM

## 2022-12-27 DIAGNOSIS — E785 Hyperlipidemia, unspecified: Secondary | ICD-10-CM

## 2022-12-27 DIAGNOSIS — Z923 Personal history of irradiation: Secondary | ICD-10-CM

## 2022-12-27 DIAGNOSIS — K219 Gastro-esophageal reflux disease without esophagitis: Secondary | ICD-10-CM

## 2022-12-27 DIAGNOSIS — F419 Anxiety disorder, unspecified: Secondary | ICD-10-CM

## 2022-12-27 DIAGNOSIS — Z85850 Personal history of malignant neoplasm of thyroid: Secondary | ICD-10-CM

## 2022-12-27 DIAGNOSIS — E890 Postprocedural hypothyroidism: Secondary | ICD-10-CM

## 2022-12-27 DIAGNOSIS — R011 Cardiac murmur, unspecified: Secondary | ICD-10-CM

## 2022-12-27 DIAGNOSIS — M4800 Spinal stenosis, site unspecified: Secondary | ICD-10-CM

## 2022-12-27 DIAGNOSIS — C73 Malignant neoplasm of thyroid gland: Secondary | ICD-10-CM

## 2022-12-27 DIAGNOSIS — B999 Unspecified infectious disease: Secondary | ICD-10-CM

## 2022-12-27 DIAGNOSIS — D539 Nutritional anemia, unspecified: Secondary | ICD-10-CM

## 2022-12-27 DIAGNOSIS — C50919 Malignant neoplasm of unspecified site of unspecified female breast: Secondary | ICD-10-CM

## 2022-12-27 DIAGNOSIS — S83209A Unspecified tear of unspecified meniscus, current injury, unspecified knee, initial encounter: Secondary | ICD-10-CM

## 2022-12-27 DIAGNOSIS — M549 Dorsalgia, unspecified: Secondary | ICD-10-CM

## 2022-12-27 DIAGNOSIS — R519 Generalized headaches: Secondary | ICD-10-CM

## 2022-12-27 DIAGNOSIS — C801 Malignant (primary) neoplasm, unspecified: Secondary | ICD-10-CM

## 2022-12-27 DIAGNOSIS — M2550 Pain in unspecified joint: Secondary | ICD-10-CM

## 2022-12-27 DIAGNOSIS — Z789 Other specified health status: Secondary | ICD-10-CM

## 2022-12-27 DIAGNOSIS — M47816 Spondylosis without myelopathy or radiculopathy, lumbar region: Secondary | ICD-10-CM

## 2022-12-27 DIAGNOSIS — I10 Essential (primary) hypertension: Secondary | ICD-10-CM

## 2022-12-27 DIAGNOSIS — F32A Depression: Secondary | ICD-10-CM

## 2022-12-27 DIAGNOSIS — M5030 Other cervical disc degeneration, unspecified cervical region: Secondary | ICD-10-CM

## 2022-12-27 DIAGNOSIS — M48062 Spinal stenosis, lumbar region with neurogenic claudication: Secondary | ICD-10-CM

## 2022-12-27 DIAGNOSIS — M5136 Other intervertebral disc degeneration, lumbar region: Secondary | ICD-10-CM

## 2022-12-27 DIAGNOSIS — IMO0002 Ulcer: Secondary | ICD-10-CM

## 2022-12-27 DIAGNOSIS — E079 Disorder of thyroid, unspecified: Secondary | ICD-10-CM

## 2022-12-27 DIAGNOSIS — M810 Age-related osteoporosis without current pathological fracture: Secondary | ICD-10-CM

## 2022-12-27 DIAGNOSIS — Z9013 Acquired absence of bilateral breasts and nipples: Secondary | ICD-10-CM

## 2022-12-27 DIAGNOSIS — W19XXXA Unspecified fall, initial encounter: Secondary | ICD-10-CM

## 2022-12-27 DIAGNOSIS — F3289 Other specified depressive episodes: Secondary | ICD-10-CM

## 2022-12-27 MED ORDER — GABAPENTIN 100 MG PO CAP
100 mg | ORAL_CAPSULE | Freq: Every evening | ORAL | 0 refills | Status: AC
Start: 2022-12-27 — End: ?

## 2022-12-27 NOTE — Telephone Encounter
Neurology: Anticonvulsants - Gabapentin Passed08/09/2022 02:59 PM   Protocol Details Depression Screening completed within the last 12 months    Valid encounter within last 12 months    Cr in normal range and within 360 days    eGFR in normal range and within 360 days

## 2022-12-27 NOTE — Progress Notes
[facility] NOTE       SUBJECTIVE:     Cindy Randolph has a past medical history of Accidental fall (02/23/22) (Knee gave out and fell down stairs), Acquired hypothyroidism, Anxiety (2016) (from pain), Back pain, Breast cancer in female [facility]) (12/2020) (Left), Cancer of thyroid (HCC) (1980), Degenerative disc disease, cervical (1994), Degenerative disc disease, lumbar (2010), Degenerative disc disease, thoracic (2010), Depression (situational, transient) (after divorce, after death of 2nd husband), Depressive disorder, not elsewhere classified (7/23) (Now on Lexapro; it is effective), Essential hypertension (began in 3rd trimester most pregnancies) (remained after last pregnancy), Essential hypertension, benign (as above), Family history of coronary artery disease in brother (06/17/2022) (04/01/2022- Per OV Dr. Maisie Fus ), Generalized headaches (1996-2010) (probably hormonally related), GERD (gastroesophageal reflux disease), H/O total thyroidectomy (05/24/1978), Heart murmur (at birth) (Dr. Maisie Fus said he didn't hear it a few years ago), High cholesterol, History of bilateral mastectomy (06/22/2022), History of external beam radiation therapy (07/15/2021), History of thyroid cancer (06/22/2022) (TSH 0.39 and pt not taking med regularly until about 2 weeks ago.  Will have her repeat TSH in 3 weeks and f/u after.  Needs to be between 0.1 and 0.3.  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 Patient presents for follow-up related to back and leg pain. She was last seen 11/23/2022 for lumbar radiculopathy.  At that visit, advised activity modification and MRI of L-spine.      She continues to have shooting pain, worst in her left and sometimes on her right. Pain level is VAS 5/10 on average. Pain is localized in her left buttock and more diffusely across her pelvic rim. Pain travels down the back of her legs and around the front of her feet. She describes the pain as first a sharp, shooting pain when it first comes out of her low back but then turns into a sensation of being cold and wet. Pain is intermittent and occurs with activity such as getting into a car or being upright for long periods of time. Pain is improved with positional changes. She is going to a gym and physical therapy. She states that she feels she has gotten stronger. She uses voltaren gel twice daily, ibuprofen, and occasionally tramadol, all of which improve pain. Overall, pain has improved since she was last seen 11/23/22. She wants to be more active like she was 20 years ago. She describes being able to ski and as well as being able to perform horseback riding.        Review of Systems   Constitutional:  Negative for fever.   Gastrointestinal:  Negative for constipation and diarrhea.   Genitourinary:  Negative for dysuria.        Urinary incontinence    Neurological:  Positive for weakness and numbness.     Current Outpatient Medications:     acetaminophen (TYLENOL) 325 mg tablet, Take two tablets by mouth every 6 hours. Take scheduled for 3 days after surgery, then as needed. Do not exceed 4,000mg  in a 24 hour period., Disp:  40 tablet, Rfl: 0    amLODIPine (NORVASC) 5 mg tablet, Take one tablet by mouth daily., Disp: 90 tablet, Rfl: 3    aspirin 81 mg chewable tablet, Chew one tablet by mouth daily., Disp: 90 tablet, Rfl: 0    biotin 1 mg cap, Take one capsule by mouth daily., Disp: , Rfl:     CALCIUM PO, Take 600 mg by mouth daily., Disp: , Rfl:     CHOLEcalciferoL (vitamin D3) 1,000 units tablet, Take one tablet by mouth daily. Takes 5000 units, Disp: , Rfl:     clobetasoL (TEMOVATE) 0.05 % topical ointment, Apply  topically to affected area twice daily. Apply to areas of rash on hands twice daily; do NOT use on face/groin/underarms, use up to 2 weeks per month, Disp: 60 g, Rfl: 3    diclofenac sodium (VOLTAREN) 1 % topical gel, Apply four g topically to affected area daily as needed., Disp: , Rfl:     escitalopram oxalate (LEXAPRO) 20 mg tablet, Take one-half tablet by mouth daily., Disp: , Rfl:     ezetimibe (ZETIA) 10 mg tablet, Take one tablet by mouth daily., Disp: 90 tablet, Rfl: 3    Fish,Bora,Flax Oils-OM3,6,9 #1 (TRIPLE OMEGA) 400-400-400 mg capsule, Take one capsule by mouth daily., Disp: , Rfl:     gabapentin (NEURONTIN) 100 mg capsule, Take one capsule by mouth at bedtime daily., Disp: 90 capsule, Rfl: 0    methylPREDNISolone (MEDROL) 32 mg tablet, Take one tablet by mouth as directed. Take 32mg  by mouth 12 hours before appointment, then take 32mg  by mouth 2 hours before appointment time, Disp: 2 tablet, Rfl: 0    metoprolol succinate XL (TOPROL XL) 25 mg extended release tablet, Take one tablet by mouth at bedtime daily., Disp: 90 tablet, Rfl: 3    omeprazole DR (PRILOSEC) 40 mg capsule, Take one capsule by mouth daily., Disp: 90 capsule, Rfl: 1    other medication, K Complete K1 & K2 as MK-4 & MK-7 : Take 1 softgel by mouth once daily, Disp: , Rfl:     rosuvastatin (CRESTOR) 10 mg tablet, Take one tablet by mouth daily., Disp: 90 tablet, Rfl: 3    Selenium 100 mcg tab, Take one tablet by mouth daily., Disp: , Rfl:     SYNTHROID 100 mcg tablet, Take one tablet by mouth daily 30 minutes before breakfast., Disp: 90 tablet, Rfl: 3    traMADoL 25 mg tablet, Take one tablet by mouth every 12 hours as needed., Disp: 30 tablet, Rfl: 0    vitamins, B complex tab, Take one tablet by mouth daily., Disp: , Rfl:     vitamins, multiple cap, Take one capsule by mouth daily., Disp: , Rfl:     zinc sulfate 220 mg (50 mg elemental zinc) capsule, Take one capsule by mouth daily., Disp: , Rfl:   Allergies   Allergen Reactions    Mango ANAPHYLAXIS    Other [Unclassified Drug] ANAPHYLAXIS     DUCK Meat and Eggs     Imdur [Isosorbide Mononitrate] CHEST TIGHTNESS    Iodinated Contrast Media RASH     She developed rash on back and neck after hospitalization 06/2022 where she underwent heart cath. Unsure if contrast was cause of rash, but it developed shortly after procedure.    Oxycodone NAUSEA ONLY     Prefers tramadol    Sudafed [Pseudoephedrine Hcl] PALPITATIONS       Physical Exam   General: Patient appears stated age, in no acute distress  HEENT: Normocephalic, atraumatic  Neck: No visible mass  Cardiovascular: Well perfused, no peripheral edema  Pulmonary: Unlabored respirations; symmetric chest wall expansion  Extremities: No cyanosis, clubbing  GI: soft, non-distended  Skin: Warm and dry  Psychiatric:  Appropriate mood and affect  Musculoskeletal: outlined below  Neurologic: EOMI  SILT bilateral L1-S1 dermatomes     Root Right Left   Hip Flexors L2 5 5   Knee Extensors L3 5 5   Ankle Dorsiflexors L4 5 5   Long Toe Extensors L5 5 5   Ankle Plantarflexors S1 5 5     Positive FABER bilaterally  Positive FADIR bilaterally  Negative tenderness over facets  Negative tenderness over paraspinals  Negative tenderness over bilateral ischial tuberosities.   Negative facet loading  Log roll negative bilaterally  Stinchfield's negative bilaterally  Full lumbar flexion, extension, lateral bend without pain.   Fortin finger positive  Positive slump on left  Straight leg negative bilaterally    Vitals:    12/27/22 1318   BP: 126/81   BP Source: Arm, Right Upper   Pulse: 78   SpO2: 97%   PainSc: Five   Weight: 74.8 kg (165 lb)   Height: 157.5 cm (5' 2.01)     Oswestry Total Score:: 44 Body mass index is 30.17 kg/m?.      IMAGING:    MRI L spine 12/23/2022:    IMPRESSION   1. Mild left convex lumbar curvature. Grade 1 anterolisthesis at L3-4 and   slight retrolisthesis at L4-5.   2. At L3-L4, disc degeneration, anterolisthesis, and facet arthropathy   with ligamentum flavum thickening results in severe spinal stenosis,   lateral recess stenosis, and right foraminal stenosis. Moderate left   foraminal stenosis.   3. At L4-L5 there is moderate central spinal stenosis and moderate to   severe bilateral foraminal stenosis as discussed   4. Mild right lateral recess and right foraminal stenosis at L2-3. Mild   bilateral foraminal stenosis at L5-S1.        Assessment:  The pain complaints are most likely due to:  1. Lumbar stenosis with neurogenic claudication    2. Lumbar radiculopathy, chronic    3. Lumbar spondylosis      Cindy Randolph is a 62 y.o. female who  has a past medical history of Accidental fall (02/23/22), Acquired hypothyroidism, Anxiety (2016), Back pain, Breast cancer in female [facility]) (12/2020), Cancer of thyroid (HCC) (1980), Degenerative disc disease, cervical (1994), Degenerative disc disease, lumbar (2010), Degenerative disc disease, thoracic (2010), Depression (situational, transient), Depressive disorder, not elsewhere classified (7/23), Essential hypertension (began in 3rd trimester most pregnancies), Essential hypertension, benign (as above), Family history of coronary artery disease in brother (06/17/2022), Generalized headaches (1996-2010), GERD (gastroesophageal reflux disease), H/O total thyroidectomy (05/24/1978), Heart murmur (at birth), High cholesterol, History of bilateral mastectomy (06/22/2022), History of external beam radiation therapy (07/15/2021), History of thyroid cancer (06/22/2022), Incontinence (3/23), Infection, Joint pain (1994), Limb alert care status, Osteoporosis (03/2021), Other and unspecified hyperlipidemia (11/23), Other malignant neoplasm without specification of site (thyroid, 1980), Spinal stenosis (2017 to present), Thyroid disorder (as noted), Torn meniscus (12/25/2020), Ulcer (1987), and Unspecified deficiency anemia (2009). who presents for evaluation of pain.    Plan:  1.  Lifestyle modification.  Continue current activities as tolerated.    2.  Medication.  Continue current medications as prescribed.  3.  Therapy.  Not indicated  4.  Interventions.  Will schedule for L4-L5 ESI.   5.  Diagnostics.  No new imaging to be ordered at this time.  6.  Follow-up.  Patient will follow-up for L4-L5 ESI. Follow up for repeat cervical ESI at future clinic visit.      Patient seen and discussed with Dr. Noralyn Pick.     Risks/benefits of all pharmacologic and interventional treatments discussed and questions answered.     Marta Antu, MD  PM&R PGY-4    ATTESTATION    I personally performed the key portions of the E/M visit, discussed case with resident and concur with resident documentation of history, physical exam, assessment, and treatment plan unless otherwise noted.    Staff name:  Lizbeth Bark, MD Date:  12/27/2022

## 2022-12-28 ENCOUNTER — Encounter: Admit: 2022-12-28 | Discharge: 2022-12-28 | Payer: MEDICARE

## 2022-12-30 ENCOUNTER — Encounter: Admit: 2022-12-30 | Discharge: 2022-12-30 | Payer: MEDICARE

## 2022-12-31 ENCOUNTER — Encounter: Admit: 2022-12-31 | Discharge: 2022-12-31 | Payer: MEDICARE

## 2022-12-31 ENCOUNTER — Ambulatory Visit: Admit: 2022-12-31 | Discharge: 2022-12-31 | Payer: MEDICARE

## 2022-12-31 DIAGNOSIS — R102 Pelvic and perineal pain: Secondary | ICD-10-CM

## 2022-12-31 DIAGNOSIS — R32 Unspecified urinary incontinence: Secondary | ICD-10-CM

## 2022-12-31 LAB — POC CREATININE, RAD: CREATININE, POC: 0.8 mg/dL — ABNORMAL LOW (ref 0.4–1.00)

## 2022-12-31 MED ORDER — SODIUM CHLORIDE 0.9 % IJ SOLN
50 mL | Freq: Once | INTRAVENOUS | 0 refills | Status: CP
Start: 2022-12-31 — End: ?
  Administered 2022-12-31: 13:00:00 50 mL via INTRAVENOUS

## 2022-12-31 MED ORDER — RP DX TC-99M MEDRONATE MCI
25 | Freq: Once | INTRAVENOUS | 0 refills | Status: CP
Start: 2022-12-31 — End: ?
  Administered 2022-12-31: 12:00:00 25.3 via INTRAVENOUS

## 2022-12-31 MED ORDER — IOHEXOL 350 MG IODINE/ML IV SOLN
100 mL | Freq: Once | INTRAVENOUS | 0 refills | Status: CP
Start: 2022-12-31 — End: ?
  Administered 2022-12-31: 13:00:00 100 mL via INTRAVENOUS

## 2023-01-03 ENCOUNTER — Ambulatory Visit: Admit: 2023-01-03 | Discharge: 2023-01-04 | Payer: MEDICARE

## 2023-01-03 ENCOUNTER — Encounter: Admit: 2023-01-03 | Discharge: 2023-01-03 | Payer: MEDICARE

## 2023-01-03 ENCOUNTER — Ambulatory Visit: Admit: 2023-01-03 | Discharge: 2023-01-03 | Payer: MEDICARE

## 2023-01-03 DIAGNOSIS — M4800 Spinal stenosis, site unspecified: Secondary | ICD-10-CM

## 2023-01-03 DIAGNOSIS — W19XXXA Unspecified fall, initial encounter: Secondary | ICD-10-CM

## 2023-01-03 DIAGNOSIS — Z789 Other specified health status: Secondary | ICD-10-CM

## 2023-01-03 DIAGNOSIS — M47816 Spondylosis without myelopathy or radiculopathy, lumbar region: Secondary | ICD-10-CM

## 2023-01-03 DIAGNOSIS — S83209A Unspecified tear of unspecified meniscus, current injury, unspecified knee, initial encounter: Secondary | ICD-10-CM

## 2023-01-03 DIAGNOSIS — R011 Cardiac murmur, unspecified: Secondary | ICD-10-CM

## 2023-01-03 DIAGNOSIS — K219 Gastro-esophageal reflux disease without esophagitis: Secondary | ICD-10-CM

## 2023-01-03 DIAGNOSIS — M5416 Radiculopathy, lumbar region: Secondary | ICD-10-CM

## 2023-01-03 DIAGNOSIS — M2550 Pain in unspecified joint: Secondary | ICD-10-CM

## 2023-01-03 DIAGNOSIS — M5134 Other intervertebral disc degeneration, thoracic region: Secondary | ICD-10-CM

## 2023-01-03 DIAGNOSIS — B999 Unspecified infectious disease: Secondary | ICD-10-CM

## 2023-01-03 DIAGNOSIS — C73 Malignant neoplasm of thyroid gland: Secondary | ICD-10-CM

## 2023-01-03 DIAGNOSIS — I10 Essential (primary) hypertension: Secondary | ICD-10-CM

## 2023-01-03 DIAGNOSIS — F419 Anxiety disorder, unspecified: Secondary | ICD-10-CM

## 2023-01-03 DIAGNOSIS — Z9013 Acquired absence of bilateral breasts and nipples: Secondary | ICD-10-CM

## 2023-01-03 DIAGNOSIS — M5030 Other cervical disc degeneration, unspecified cervical region: Secondary | ICD-10-CM

## 2023-01-03 DIAGNOSIS — R32 Unspecified urinary incontinence: Secondary | ICD-10-CM

## 2023-01-03 DIAGNOSIS — E890 Postprocedural hypothyroidism: Secondary | ICD-10-CM

## 2023-01-03 DIAGNOSIS — M48062 Spinal stenosis, lumbar region with neurogenic claudication: Secondary | ICD-10-CM

## 2023-01-03 DIAGNOSIS — M810 Age-related osteoporosis without current pathological fracture: Secondary | ICD-10-CM

## 2023-01-03 DIAGNOSIS — E7800 Pure hypercholesterolemia, unspecified: Secondary | ICD-10-CM

## 2023-01-03 DIAGNOSIS — E785 Hyperlipidemia, unspecified: Secondary | ICD-10-CM

## 2023-01-03 DIAGNOSIS — C801 Malignant (primary) neoplasm, unspecified: Secondary | ICD-10-CM

## 2023-01-03 DIAGNOSIS — Z8249 Family history of ischemic heart disease and other diseases of the circulatory system: Secondary | ICD-10-CM

## 2023-01-03 DIAGNOSIS — IMO0002 Ulcer: Secondary | ICD-10-CM

## 2023-01-03 DIAGNOSIS — M549 Dorsalgia, unspecified: Secondary | ICD-10-CM

## 2023-01-03 DIAGNOSIS — F3289 Other specified depressive episodes: Secondary | ICD-10-CM

## 2023-01-03 DIAGNOSIS — D539 Nutritional anemia, unspecified: Secondary | ICD-10-CM

## 2023-01-03 DIAGNOSIS — M5136 Other intervertebral disc degeneration, lumbar region: Secondary | ICD-10-CM

## 2023-01-03 DIAGNOSIS — Z923 Personal history of irradiation: Secondary | ICD-10-CM

## 2023-01-03 DIAGNOSIS — E039 Hypothyroidism, unspecified: Secondary | ICD-10-CM

## 2023-01-03 DIAGNOSIS — Z85850 Personal history of malignant neoplasm of thyroid: Secondary | ICD-10-CM

## 2023-01-03 DIAGNOSIS — C50919 Malignant neoplasm of unspecified site of unspecified female breast: Secondary | ICD-10-CM

## 2023-01-03 DIAGNOSIS — R519 Generalized headaches: Secondary | ICD-10-CM

## 2023-01-03 DIAGNOSIS — E079 Disorder of thyroid, unspecified: Secondary | ICD-10-CM

## 2023-01-03 DIAGNOSIS — F32A Depression: Secondary | ICD-10-CM

## 2023-01-03 MED ORDER — LIDOCAINE (PF) 10 MG/ML (1 %) IJ SOLN
2 mL | Freq: Once | INTRAMUSCULAR | 0 refills | Status: CP
Start: 2023-01-03 — End: ?

## 2023-01-03 MED ORDER — IOHEXOL 300 MG IODINE/ML IV SOLN
1 mL | Freq: Once | 0 refills | Status: CP
Start: 2023-01-03 — End: ?

## 2023-01-03 MED ORDER — SODIUM CHLORIDE 0.9 % IJ SOLN
3 mL | Freq: Once | INTRAMUSCULAR | 0 refills | Status: CP
Start: 2023-01-03 — End: ?

## 2023-01-03 MED ORDER — TRIAMCINOLONE ACETONIDE 40 MG/ML IJ SUSP
80 mg | Freq: Once | EPIDURAL | 0 refills | Status: CP
Start: 2023-01-03 — End: ?

## 2023-01-03 NOTE — Procedures
Attending Surgeon: Lizbeth Bark, MD    Anesthesia: Local    Pre-Procedure Diagnosis:   1. Lumbar radiculopathy, chronic    2. Lumbar stenosis with neurogenic claudication    3. Lumbar spondylosis        Post-Procedure Diagnosis:   1. Lumbar radiculopathy, chronic    2. Lumbar stenosis with neurogenic claudication    3. Lumbar spondylosis        Epidural Steroid Injection Lumbar/Caudal  Procedure: epidural - interlaminar    Laterality: n/a   on 01/03/2023 9:05 AM  Location: lumbar ESI with imaging - L4-5      Consent:   Consent obtained: verbal and written  Consent given by: Cindy Randolph  Risks discussed: bleeding, bruising, infection, weakness and no change or worsening in pain    Discussed with Cindy Randolph the purpose of the treatment/procedure, other ways of treating my condition, including no treatment/ procedure and the risks and benefits of the alternatives. Cindy Randolph has decided to proceed with treatment/procedure.        Universal Protocol:  Relevant documents: relevant documents present and verified  Test results: test results available and properly labeled  Imaging studies: imaging studies available  Required items: required blood products, implants, devices, and special equipment available  Site marked: the operative site was marked  Cindy Randolph identity confirmed: Cindy Randolph identify confirmed verbally with Cindy Randolph.        Time out: Immediately prior to procedure a time out was called to verify the correct Cindy Randolph, procedure, equipment, support staff and site/side marked as required      Procedures Details:   Indications: pain   Prep: chlorhexidine  Number of Levels: 1  Approach: left paramedian  Needle size: 18 G  Cindy Randolph tolerance: Cindy Randolph tolerated the procedure well with no immediate complications. Pressure was applied, and hemostasis was accomplished.  Comments: DESCRIPTION OF PROCEDURE:  The procedure risks and benefits were explained to the Cindy Randolph and informed consent was obtained.  The Cindy Randolph was positioned prone on the fluoroscopy table with a pillow under the abdomen to help reduce lumbar lordosis.  Blood pressure cuff and oxygen saturation monitors were attached and the Cindy Randolph was monitored throughout the entire procedure.  The L4 vertebral level was identified with the use of fluoroscopy in the AP view.  The skin was prepped using Chlorhexadine and draped in aseptic fashion.  The skin and subcutaneous tissue were anesthetized using 3 mL of 1 percent lidocaine with a 27-gauge needle.  A 3.5 inch 22-gauge Tuohy needle was slowly advanced using AP view towards the midline L4-L5 epidural space.  The latter part of the needle advancement was guided with fluoroscopy in the lateral view.  The epidural space was identified using loss of resistance technique.  After negative aspiration, 1 mL of Isovue contrast dye was injected.  After epidural spread was seen, a solution containing 80 mg of triamcinolone and 2 mL of normal saline was injected in increments.  The stylet was reinserted and the needle was then removed.     After the procedure, the Cindy Randolph's blood pressure, heart rate, oxygen saturation, and VAS were recorded in the chart. There were no complications.  The Cindy Randolph tolerated the procedure well and was brought to recovery room for observation in stable condition and discharged with written discharge instructions.     PLAN OF CARE:  The Cindy Randolph is to followup in 6 weeks.    The Cindy Randolph was advised to contact the [facility] for any of the following    1.  Fever, chills, or night sweats.  2. New onset of severe sharp pain.  3. Any new upper or lower extremity weakness or numbness.  4. Any questions regarding the procedure.  This Cindy Randolph's clinical history, exam, AND imaging support radiculopathy AND there is a significant impact on quality of life and function AND the pain has been present for at least 4 weeks AND they have failed to improve with noninvasive conservative care.        Estimated blood loss: none or minimal  Specimens: none  Cindy Randolph tolerated the procedure well with no immediate complications. Pressure was applied, and hemostasis was accomplished.  Administrations This Visit       iohexoL (OMNIPAQUE-300) 300 mg/mL injection 1 mL       Admin Date  01/03/2023 Action  Given Dose  1 mL Route  SEE ADMIN INSTRUCTIONS Documented By  Ceasar Lund, APRN-NP              lidocaine PF 1% (10 mg/mL) injection 2 mL       Admin Date  01/03/2023 Action  Given Dose  2 mL Route  Injection Documented By  Ceasar Lund, APRN-NP              sodium chloride PF 0.9% injection 3 mL       Admin Date  01/03/2023 Action  Given Dose  3 mL Route  Injection Documented By  Ceasar Lund, APRN-NP              triamcinolone acetonide (KENALOG-40) injection 80 mg       Admin Date  01/03/2023 Action  Given Dose  80 mg Route  Epidural Documented By  Ceasar Lund, APRN-NP

## 2023-01-03 NOTE — Progress Notes
[facility]  INTERVENTIONAL PAIN PROCEDURE HISTORY AND PHYSICAL    No chief complaint on file.      HISTORY OF PRESENT ILLNESS:  Cindy Randolph is a 62 y.o. year old female who presents for injection.  Denies fevers, chills, or recent hospitalizations.  Patient denies currently taking blood thinning medications.       Past Medical History:   Diagnosis Date    Accidental fall 02/23/22    Knee gave out and fell down stairs    Acquired hypothyroidism     Anxiety 2016    from pain    Back pain     Breast cancer in female [facility]) 12/2020    Left    Cancer of thyroid (HCC) 1980    Degenerative disc disease, cervical 1994    Degenerative disc disease, lumbar 2010    Degenerative disc disease, thoracic 2010    Depression situational, transient    after divorce, after death of 2nd husband    Depressive disorder, not elsewhere classified 7/23    Now on Lexapro; it is effective    Essential hypertension began in 3rd trimester most pregnancies    remained after last pregnancy    Essential hypertension, benign as above    Family history of coronary artery disease in brother 06/17/2022    04/01/2022- Per OV Dr. Maisie Fus     Generalized headaches 1996-2010    probably hormonally related    GERD (gastroesophageal reflux disease)     H/O total thyroidectomy 05/24/1978    Heart murmur at birth    Dr. Maisie Fus said he didn't hear it a few years ago    High cholesterol     History of bilateral mastectomy 06/22/2022    History of external beam radiation therapy 07/15/2021    History of thyroid cancer 06/22/2022    TSH 0.39 and pt not taking med regularly until about 2 weeks ago.  Will have her repeat TSH in 3 weeks and f/u after.  Needs to be between 0.1 and 0.3.  Will refer her back to Endo for f/u.    Incontinence 3/23    With coughing and sneezing and strong sudden mivemwnts    Infection     Joint pain 1994    Limb alert care status     No access on R-arm    Osteoporosis 03/2021    Bilat hips    Other and unspecified hyperlipidemia 11/23    Will see cardiologist early 2024    Other malignant neoplasm without specification of site thyroid, 1980    thyroidectomy    Spinal stenosis 2017 to present    per MRIs    Thyroid disorder as noted    Torn meniscus 12/25/2020    Ulcer 1987    with divorce; resolved    Unspecified deficiency anemia 2009    from excessive bleeding post-partum; treated iron       Surgical History:   Procedure Laterality Date    HX WRIST FRACTURE SURGERY  1993    Baker's thumb with fixation    ARTHROPLASTY  2001    L ACL    COLONOSCOPY  2017    normal    ARTHROSCOPY KNEE WITH PARTIAL LATERAL MENISCECTOMY AND LEFT KNEE INJECTION. Right 01/08/2021    Performed by Tanja Port, MD at [facility]    BILATERAL TOTAL MASTECTOMIES Bilateral 01/22/2021    Performed by Massie Kluver, MD at [facility]    INTRAOPERATIVE SENTINEL LYMPH NODE IDENTIFICATION  WITH/ WITHOUT NON-RADIOACTIVE DYE INJECTION Left 01/22/2021    Performed by Massie Kluver, MD at [facility]    INJECTION RADIOACTIVE TRACER FOR SENTINEL NODE IDENTIFICATION Left 01/22/2021    Performed by Massie Kluver, MD at [facility]    LEFT AXILLARY SENTINEL LYMPH NODE BIOPSY Left 01/22/2021    Performed by Massie Kluver, MD at [facility]    BILATERAL CHEST FLAT CLOSURE Bilateral 01/22/2021    Performed by Stevenson Clinch, MD at [facility]    BILATERAL CHEST FLAT CLOSURE x 8 Bilateral 01/22/2021    Performed by Stevenson Clinch, MD at [facility]    EXCISION BENIGN LESION 0.5 CM OR LESS - TORSO Right 01/22/2021    Performed by Stevenson Clinch, MD at [facility]    TUNNELED VENOUS PORT PLACEMENT Right 02/19/2021    Placement of port-a-cath - 50F Right 02/19/2021    Performed by Freund, Alecia Lemming., MD at [facility]    FLUOROSCOPIC GUIDANCE CENTRAL VENOUS ACCESS DEVICE PLACEMENT/ REPLACEMENT/ REMOVAL N/A 02/19/2021    Performed by Flo Shanks Alecia Lemming., MD at [facility] OR    Left Completion Axillary Lymph Node Dissection Left 05/14/2021    Performed by Massie Kluver, MD at [facility]    REMOVAL TUNNELED CENTRAL VENOUS ACCESS DEVICE INCLUDING PORT/ PUMP Right 05/14/2021    Performed by Massie Kluver, MD at [facility]    COLONOSCOPY DIAGNOSTIC WITH SPECIMEN COLLECTION BY BRUSHING/ WASHING - FLEXIBLE N/A 10/14/2021    Performed by Benetta Spar, MD at [facility]    ESOPHAGOGASTRODUODENOSCOPY WITH SPECIMEN COLLECTION BY BRUSHING/ WASHING N/A 06/17/2022    Performed by Remigio Eisenmenger, MD at [facility] ENDO    ESOPHAGOGASTRODUODENOSCOPY WITH DILATION ESOPHAGUS WITH BALLOON 30 MM OR GREATER - FLEXIBLE N/A 06/17/2022    Performed by Remigio Eisenmenger, MD at [facility] ENDO    ANGIOGRAPHY CORONARY ARTERY WITH LEFT HEART CATHETERIZATION N/A 07/16/2022    Performed by Harley Alto, MD at [facility] CATH LAB    PERCUTANEOUS CORONARY STENT PLACEMENT WITH ANGIOPLASTY N/A 07/16/2022    Performed by Harley Alto, MD at [facility] CATH LAB    ACL RECONSTRUCTION  06/03/1999    BREAST SURGERY  01/24/21    Bilat mastectomy    ECHOCARDIOGRAM PROCEDURE      FRACTURE SURGERY  1993    ORIF L 1st metacarpal    HX CARPAL TUNNEL RELEASE  1996    With second and other oregbnancies    KNEE SURGERY  L ACL as above    LYMPH NODE DISSECTION  12/22    Left    THYROIDECTOMY         family history includes Alcohol abuse in her father; Arthritis in her mother and paternal grandmother; Arthritis-osteo in her paternal grandmother; Asthma in her brother; Back pain in her brother, brother, brother, mother, and paternal grandmother; Basal Cell Carcinoma in her brother, brother, and brother; Birth Defect in her daughter and nephew; Cancer in her mother; Physiological scientist (age of onset: 61) in her mother; Cancer-Breast (age of onset: 97) in her maternal great-aunt; Diabetes in her father, maternal grandfather, paternal grandfather, and paternal grandmother; Heart Disease in her father; Heart problem in her brother and father; Hypertension in her brother, brother, brother, father, and mother; Joint Pain in her mother; Miscarriages in her mother; Neck Pain in her mother; Stroke in her maternal uncle; Thyroid Disease in her maternal uncle and maternal uncle.  Social History     Socioeconomic History    Marital status: Married    Number of children: 8   Occupational History    Occupation: Retired Engineer, civil (consulting)     Comment: Worked in Multimedia programmer; also worked in the ambulatory setting   Tobacco Use    Smoking status: Never     Passive exposure: Past    Smokeless tobacco: Never    Tobacco comments:     Significant second hand smoke from parents 17-25 years old   Vaping Use    Vaping status: Never Used   Substance and Sexual Activity    Alcohol use: Not Currently     Comment: Prior to 2020, socially, one drink once a week at most    Drug use: Never     Comment: in late teens, early twenties, a few times and not since    Sexual activity: Yes     Partners: Male     Birth control/protection: Post-menopausal, None       Allergies   Allergen Reactions    Mango ANAPHYLAXIS    Other [Unclassified Drug] ANAPHYLAXIS     DUCK Meat and Eggs     Imdur [Isosorbide Mononitrate] CHEST TIGHTNESS    Iodinated Contrast Media RASH     She developed rash on back and neck after hospitalization 06/2022 where she underwent heart cath. Unsure if contrast was cause of rash, but it developed shortly after procedure.    Oxycodone NAUSEA ONLY     Prefers tramadol    Sudafed [Pseudoephedrine Hcl] PALPITATIONS       Vitals:    01/03/23 0919   BP: 119/88   BP Source: Arm, Right Upper   Pulse: 73   Temp: 36.5 ?C (97.7 ?F)   SpO2: 97%   O2 Device: None (Room air)   Weight: 72.6 kg (160 lb)   Height: 160 cm (5' 3)        Oswestry Total Score:: 40    REVIEW OF SYSTEMS: 10 point ROS obtained and negative except bck pain      PHYSICAL EXAM:  General: 62 y.o. female appears stated age, in no acute distress  HEENT: Normocephalic, atraumatic  Neck: No thyroidmegaly  Cardiovascular: Well perfused  Pulmonary: Unlabored respirations  Extremities: No cyanosis, clubbing, or edema  Skin: No lesions seen on exposed skin  Psychiatric: Appropriate mood and affect  Musculoskeletal: No atrophy.   Neurologic: Antigravity strength in all extremities. CN II -XII grossly intact.  Alert and oriented x 3.           IMPRESSION:    1. Lumbar stenosis with neurogenic claudication    2. Lumbar radiculopathy, chronic    3. Lumbar spondylosis         PLAN: Lumbar Epidural Steroid Injection L4-5

## 2023-01-03 NOTE — Discharge Instructions - Supplementary Instructions
GENERAL POST PROCEDURE INSTRUCTIONS  Physician: _________________________________  Procedure Completed Today:  Joint Injection (hip, knee, shoulder)  Cervical Epidural Steroid Injection  Cervical Transforaminal Steroid Injection  Trigger Point Injection  Caudal Epidural Steroid Injection  Piriformis Injection  Pudendal Nerve Block  Other _____________________ Thoracic Epidural Steroid Injection  Lumbar Epidural Steroid Injection  Lumbar Transforaminal Steroid Injection  Facet Joint Injection  Celiac Nerve Block  Sacrococcygeal  Sacroiliac Joint Injection   Important information following your procedure today:  You may drive today     If you had sedation, you may NOT drive today  Rest at home for the next 6 hours.  You may then begin to resume your normal activities.  DO NOT drive any vehicle, operate any power tools, drink alcohol, make any major decisions, or sign any legal documents for the next 12 hours.  Pain relief may not be immediate. It is possible you may even experience an increase in pain during the first 24-48 hours followed by a gradual decrease of your pain.  Though the procedure is generally safe, and complications are rare, we do ask that you be aware of any of the following:  Any swelling, persistent redness, new bleeding or drainage from the site of the injection.  You should not experience a severe headache.  You should not run a fever over 101oF.  New onset of sharp, severe back and or neck pain.  New onset of upper or lower extremity numbness or weakness.  New difficulty controlling bowel or bladder function after injection.  New shortness of breath.  ** If any of these occur, please call to report this occurrence to the nurse of Dr. Carroll at [phone]. If you are calling after 4:00 p.m. or on weekends or holidays, please call [phone] and ask to have the resident physician on call for the physician paged or go to your local emergency room.  You may experience soreness at the injection site. Ice can be applied at 20-minute intervals for the first 24 hours. The following day you may alternate ice with heat if you are experiencing muscle tightness, otherwise continue with ice. Ice works best at decreasing pain. Avoid application of direct heat, hot showers or hot tubs today.  Avoid strenuous activity today. You many resume your regular activities and exercise tomorrow.  Patients with diabetes may see an elevation in blood sugars for 7-10 days after the injection. It is important to pay close attention to your diet, check your blood sugars daily and report extreme elevations to the physician that manages your diabetes.  Patients taking daily blood thinners can resume their regular dose this evening.  It is important that you take all medications ordered by your pain physician. Taking medications as ordered is an important part of your pain care plan. If you cannot continue the medication plan, please notify the physician.    Possible side effects to steroids that may occur:  Flushing or redness of the face  Irritability  Fluid retention  Change in women's menses  Minor headache    If you are unable to keep your upcoming appointment, please notify the [facility] scheduler at [phone] at least 24 hours in advance. If you have questions for the surgery center, call [facility] at [phone].

## 2023-01-04 DIAGNOSIS — M5416 Radiculopathy, lumbar region: Secondary | ICD-10-CM

## 2023-01-04 DIAGNOSIS — M47816 Spondylosis without myelopathy or radiculopathy, lumbar region: Secondary | ICD-10-CM

## 2023-01-04 DIAGNOSIS — M48062 Spinal stenosis, lumbar region with neurogenic claudication: Secondary | ICD-10-CM

## 2023-01-05 ENCOUNTER — Encounter: Admit: 2023-01-05 | Discharge: 2023-01-05 | Payer: MEDICARE

## 2023-01-05 DIAGNOSIS — M170 Bilateral primary osteoarthritis of knee: Secondary | ICD-10-CM

## 2023-01-06 ENCOUNTER — Encounter: Admit: 2023-01-06 | Discharge: 2023-01-06 | Payer: MEDICARE

## 2023-01-06 DIAGNOSIS — M79642 Pain in left hand: Secondary | ICD-10-CM

## 2023-01-06 NOTE — Telephone Encounter
Labs ordered for patient to complete,per Dr Okey Dupre

## 2023-01-07 ENCOUNTER — Encounter: Admit: 2023-01-07 | Discharge: 2023-01-07 | Payer: MEDICARE

## 2023-01-07 ENCOUNTER — Ambulatory Visit: Admit: 2023-01-07 | Discharge: 2023-01-08 | Payer: MEDICARE

## 2023-01-07 ENCOUNTER — Ambulatory Visit: Admit: 2023-01-07 | Discharge: 2023-01-07 | Payer: MEDICARE

## 2023-01-07 DIAGNOSIS — F3289 Other specified depressive episodes: Secondary | ICD-10-CM

## 2023-01-07 DIAGNOSIS — R519 Headache, unspecified: Secondary | ICD-10-CM

## 2023-01-07 DIAGNOSIS — R011 Cardiac murmur, unspecified: Secondary | ICD-10-CM

## 2023-01-07 DIAGNOSIS — M549 Dorsalgia, unspecified: Secondary | ICD-10-CM

## 2023-01-07 DIAGNOSIS — M19042 Primary osteoarthritis, left hand: Secondary | ICD-10-CM

## 2023-01-07 DIAGNOSIS — R32 Unspecified urinary incontinence: Secondary | ICD-10-CM

## 2023-01-07 DIAGNOSIS — E039 Hypothyroidism, unspecified: Secondary | ICD-10-CM

## 2023-01-07 DIAGNOSIS — C801 Malignant (primary) neoplasm, unspecified: Secondary | ICD-10-CM

## 2023-01-07 DIAGNOSIS — M810 Age-related osteoporosis without current pathological fracture: Secondary | ICD-10-CM

## 2023-01-07 DIAGNOSIS — F419 Anxiety disorder, unspecified: Secondary | ICD-10-CM

## 2023-01-07 DIAGNOSIS — W19XXXA Unspecified fall, initial encounter: Secondary | ICD-10-CM

## 2023-01-07 DIAGNOSIS — Z789 Other specified health status: Secondary | ICD-10-CM

## 2023-01-07 DIAGNOSIS — M180 Bilateral primary osteoarthritis of first carpometacarpal joints: Secondary | ICD-10-CM

## 2023-01-07 DIAGNOSIS — IMO0002 Ulcer: Secondary | ICD-10-CM

## 2023-01-07 DIAGNOSIS — M5030 Other cervical disc degeneration, unspecified cervical region: Secondary | ICD-10-CM

## 2023-01-07 DIAGNOSIS — I10 Essential (primary) hypertension: Secondary | ICD-10-CM

## 2023-01-07 DIAGNOSIS — E079 Disorder of thyroid, unspecified: Secondary | ICD-10-CM

## 2023-01-07 DIAGNOSIS — B999 Unspecified infectious disease: Secondary | ICD-10-CM

## 2023-01-07 DIAGNOSIS — Z85850 Personal history of malignant neoplasm of thyroid: Secondary | ICD-10-CM

## 2023-01-07 DIAGNOSIS — C73 Malignant neoplasm of thyroid gland: Secondary | ICD-10-CM

## 2023-01-07 DIAGNOSIS — F32A Depression: Secondary | ICD-10-CM

## 2023-01-07 DIAGNOSIS — Z923 Personal history of irradiation: Secondary | ICD-10-CM

## 2023-01-07 DIAGNOSIS — M5136 Other intervertebral disc degeneration, lumbar region: Secondary | ICD-10-CM

## 2023-01-07 DIAGNOSIS — C50919 Malignant neoplasm of unspecified site of unspecified female breast: Secondary | ICD-10-CM

## 2023-01-07 DIAGNOSIS — M19041 Primary osteoarthritis, right hand: Secondary | ICD-10-CM

## 2023-01-07 DIAGNOSIS — E785 Hyperlipidemia, unspecified: Secondary | ICD-10-CM

## 2023-01-07 DIAGNOSIS — S83209A Unspecified tear of unspecified meniscus, current injury, unspecified knee, initial encounter: Secondary | ICD-10-CM

## 2023-01-07 DIAGNOSIS — M79642 Pain in left hand: Secondary | ICD-10-CM

## 2023-01-07 DIAGNOSIS — Z8249 Family history of ischemic heart disease and other diseases of the circulatory system: Secondary | ICD-10-CM

## 2023-01-07 DIAGNOSIS — M4800 Spinal stenosis, site unspecified: Secondary | ICD-10-CM

## 2023-01-07 DIAGNOSIS — E890 Postprocedural hypothyroidism: Secondary | ICD-10-CM

## 2023-01-07 DIAGNOSIS — D539 Nutritional anemia, unspecified: Secondary | ICD-10-CM

## 2023-01-07 DIAGNOSIS — M5134 Other intervertebral disc degeneration, thoracic region: Secondary | ICD-10-CM

## 2023-01-07 DIAGNOSIS — Z9013 Acquired absence of bilateral breasts and nipples: Secondary | ICD-10-CM

## 2023-01-07 DIAGNOSIS — E7800 Pure hypercholesterolemia, unspecified: Secondary | ICD-10-CM

## 2023-01-07 DIAGNOSIS — K219 Gastro-esophageal reflux disease without esophagitis: Secondary | ICD-10-CM

## 2023-01-07 DIAGNOSIS — M2550 Pain in unspecified joint: Secondary | ICD-10-CM

## 2023-01-07 MED ORDER — TRIAMCINOLONE ACETONIDE 40 MG/ML IJ SUSP
40 mg | Freq: Once | INTRAMUSCULAR | 0 refills | Status: CP | PRN
Start: 2023-01-07 — End: ?

## 2023-01-07 MED ORDER — LIDOCAINE (PF) 10 MG/ML (1 %) IJ SOLN
1 mL | Freq: Once | INTRAMUSCULAR | 0 refills | Status: CP | PRN
Start: 2023-01-07 — End: ?

## 2023-01-07 NOTE — Progress Notes
[facility]      Date of Service: 01/07/2023    Subjective:           Bilateral hand pain    History of Present Illness  This is a 62 year old female presenting with several months of bilateral hand pain.  There was no recent injury.  Her symptoms started after an infusion for an osteoporosis treatment.  She does have a history of a previous fracture at the base of her left thumb treated surgically over 30 years ago.  She reports pain primarily around the base of both thumbs which is worse with use.  She does have some chronic tingling in her fingers from previous chemotherapy.  She also notices some deformity in mild pain around the index finger distal joint.  She has had no recent treatment.  She has tried anti-inflammatories for other reasons in the past and she was told not to do this by her cardiologist.  Cindy Randolph is a 62 y.o. female.     Review of Systems      Objective:          acetaminophen (TYLENOL) 325 mg tablet Take two tablets by mouth every 6 hours. Take scheduled for 3 days after surgery, then as needed. Do not exceed 4,000mg  in a 24 hour period.    amLODIPine (NORVASC) 5 mg tablet Take one tablet by mouth daily.    aspirin 81 mg chewable tablet Chew one tablet by mouth daily.    biotin 1 mg cap Take one capsule by mouth daily.    CALCIUM PO Take 600 mg by mouth daily.    CHOLEcalciferoL (vitamin D3) 1,000 units tablet Take one tablet by mouth daily. Takes 5000 units    clobetasoL (TEMOVATE) 0.05 % topical ointment Apply  topically to affected area twice daily. Apply to areas of rash on hands twice daily; do NOT use on face/groin/underarms, use up to 2 weeks per month    diclofenac sodium (VOLTAREN) 1 % topical gel Apply four g topically to affected area daily as needed.    escitalopram oxalate (LEXAPRO) 20 mg tablet Take one-half tablet by mouth daily.    ezetimibe (ZETIA) 10 mg tablet Take one tablet by mouth daily.    Fish,Bora,Flax Oils-OM3,6,9 #1 (TRIPLE OMEGA) 400-400-400 mg capsule Take one capsule by mouth daily.    gabapentin (NEURONTIN) 100 mg capsule Take one capsule by mouth at bedtime daily.    methylPREDNISolone (MEDROL) 32 mg tablet Take one tablet by mouth as directed. Take 32mg  by mouth 12 hours before appointment, then take 32mg  by mouth 2 hours before appointment time    metoprolol succinate XL (TOPROL XL) 25 mg extended release tablet Take one tablet by mouth at bedtime daily.    omeprazole DR (PRILOSEC) 40 mg capsule Take one capsule by mouth daily.    other medication K Complete K1 & K2 as MK-4 & MK-7 : Take 1 softgel by mouth once daily    rosuvastatin (CRESTOR) 10 mg tablet Take one tablet by mouth daily.    Selenium 100 mcg tab Take one tablet by mouth daily.    SYNTHROID 100 mcg tablet Take one tablet by mouth daily 30 minutes before breakfast.    traMADoL 25 mg tablet Take one tablet by mouth every 12 hours as needed.    vitamins, B complex tab Take one tablet by mouth daily.    vitamins, multiple cap Take one capsule by mouth daily.  zinc sulfate 220 mg (50 mg elemental zinc) capsule Take one capsule by mouth daily.     Vitals:    01/07/23 1018   PainSc: Two     There is no height or weight on file to calculate BMI.     Physical Exam  Constitutional:       General: She is not in acute distress.     Appearance: Normal appearance.   Neurological:      Mental Status: She is oriented to person, place, and time.   Psychiatric:         Behavior: Behavior normal.       Ortho Exam  Bilateral hands: The skin is intact, there is a well-healed scar at the base of the left thumb consistent with her history.  She does have some angle deformity of the distal joint of the right index finger with mild tenderness.  She has swelling enlargement of the bilateral thumb CMC joints with limited range of motion as well as tenderness and a positive CMC grind test.  She has intact sensation and brisk cap refill to all digits.       Assessment and Plan:  She has symptomatic bilateral thumb CMC joint osteoarthritis.  On the left side it is somewhat posttraumatic with the retained hardware at the base of the metacarpal.  I would recommend trying some conservative care with steroid injections as well as MetaGrip bracing.  For the right index finger distal joint she could consider a arthrodesis of that at some point if she desires.  She is welcome to follow-up with me as needed.    Small Joint Injection/Aspiration: R thumb CMC on 01/07/2023 10:40 AM    Consent:   Consent obtained: verbal  Consent given by: patient  Risks discussed: skin discoloration, bleeding, damage to surrounding structures, hyperglycemia, infection, pain, soft tissue reaction, subcutaneous fat atrophy, tendon rupture, vasovagal reaction and arrhythmia  Alternatives discussed: alternative treatment, delayed treatment and no treatment  Discussed with patient the purpose of the treatment/procedure, other ways of treating my condition, including no treatment/ procedure and the risks and benefits of the alternatives. Patient has decided to proceed with treatment/procedure.        Universal Protocol:  Relevant documents: relevant documents present and verified  Patient identity confirmed: Patient identify confirmed verbally with patient.          Procedures Details:  Procedure Peformed: Injection Only  Indications: pain  Details:Prep: alcohol   25 G needle, dorsal approachMedications: 40 mg triamcinolone acetonide 40 mg/mL; 1 mL lidocaine PF 1% (10 mg/mL)  Outcome: tolerated well, no immediate complications                             Philomena Course, MD  Associate Professor  Orthopedic Hand Surgery

## 2023-01-07 NOTE — Progress Notes
Small Joint Injection/Aspiration: L thumb CMC on 01/07/2023 10:40 AM    Consent:   Consent obtained: verbal  Consent given by: patient  Risks discussed: skin discoloration, bleeding, damage to surrounding structures, hyperglycemia, infection, pain, soft tissue reaction, subcutaneous fat atrophy, tendon rupture, vasovagal reaction and arrhythmia  Alternatives discussed: alternative treatment, delayed treatment and no treatment  Discussed with patient the purpose of the treatment/procedure, other ways of treating my condition, including no treatment/ procedure and the risks and benefits of the alternatives. Patient has decided to proceed with treatment/procedure.        Universal Protocol:  Relevant documents: relevant documents present and verified  Patient identity confirmed: Patient identify confirmed verbally with patient.          Procedures Details:  Procedure Peformed: Injection Only  Indications: pain  Details:Prep: alcohol   25 G needle, dorsal approachMedications: 40 mg triamcinolone acetonide 40 mg/mL; 1 mL lidocaine PF 1% (10 mg/mL)  Outcome: tolerated well, no immediate complications

## 2023-01-11 ENCOUNTER — Encounter: Admit: 2023-01-11 | Discharge: 2023-01-11 | Payer: MEDICARE

## 2023-01-11 ENCOUNTER — Ambulatory Visit: Admit: 2023-01-11 | Discharge: 2023-01-12 | Payer: MEDICARE

## 2023-01-11 DIAGNOSIS — E039 Hypothyroidism, unspecified: Secondary | ICD-10-CM

## 2023-01-11 DIAGNOSIS — K219 Gastro-esophageal reflux disease without esophagitis: Secondary | ICD-10-CM

## 2023-01-11 DIAGNOSIS — E890 Postprocedural hypothyroidism: Secondary | ICD-10-CM

## 2023-01-11 DIAGNOSIS — Z9013 Acquired absence of bilateral breasts and nipples: Secondary | ICD-10-CM

## 2023-01-11 DIAGNOSIS — B999 Unspecified infectious disease: Secondary | ICD-10-CM

## 2023-01-11 DIAGNOSIS — S83209A Unspecified tear of unspecified meniscus, current injury, unspecified knee, initial encounter: Secondary | ICD-10-CM

## 2023-01-11 DIAGNOSIS — M549 Dorsalgia, unspecified: Secondary | ICD-10-CM

## 2023-01-11 DIAGNOSIS — C801 Malignant (primary) neoplasm, unspecified: Secondary | ICD-10-CM

## 2023-01-11 DIAGNOSIS — M5136 Other intervertebral disc degeneration, lumbar region: Secondary | ICD-10-CM

## 2023-01-11 DIAGNOSIS — E785 Hyperlipidemia, unspecified: Secondary | ICD-10-CM

## 2023-01-11 DIAGNOSIS — F419 Anxiety disorder, unspecified: Secondary | ICD-10-CM

## 2023-01-11 DIAGNOSIS — R32 Unspecified urinary incontinence: Secondary | ICD-10-CM

## 2023-01-11 DIAGNOSIS — M4800 Spinal stenosis, site unspecified: Secondary | ICD-10-CM

## 2023-01-11 DIAGNOSIS — R519 Generalized headaches: Secondary | ICD-10-CM

## 2023-01-11 DIAGNOSIS — F3289 Other specified depressive episodes: Secondary | ICD-10-CM

## 2023-01-11 DIAGNOSIS — Z789 Other specified health status: Secondary | ICD-10-CM

## 2023-01-11 DIAGNOSIS — IMO0002 Ulcer: Secondary | ICD-10-CM

## 2023-01-11 DIAGNOSIS — M5030 Other cervical disc degeneration, unspecified cervical region: Secondary | ICD-10-CM

## 2023-01-11 DIAGNOSIS — Z923 Personal history of irradiation: Secondary | ICD-10-CM

## 2023-01-11 DIAGNOSIS — Z85850 Personal history of malignant neoplasm of thyroid: Secondary | ICD-10-CM

## 2023-01-11 DIAGNOSIS — C50919 Malignant neoplasm of unspecified site of unspecified female breast: Secondary | ICD-10-CM

## 2023-01-11 DIAGNOSIS — E079 Disorder of thyroid, unspecified: Secondary | ICD-10-CM

## 2023-01-11 DIAGNOSIS — D539 Nutritional anemia, unspecified: Secondary | ICD-10-CM

## 2023-01-11 DIAGNOSIS — W19XXXA Unspecified fall, initial encounter: Secondary | ICD-10-CM

## 2023-01-11 DIAGNOSIS — M5134 Other intervertebral disc degeneration, thoracic region: Secondary | ICD-10-CM

## 2023-01-11 DIAGNOSIS — C73 Malignant neoplasm of thyroid gland: Secondary | ICD-10-CM

## 2023-01-11 DIAGNOSIS — M810 Age-related osteoporosis without current pathological fracture: Secondary | ICD-10-CM

## 2023-01-11 DIAGNOSIS — R011 Cardiac murmur, unspecified: Secondary | ICD-10-CM

## 2023-01-11 DIAGNOSIS — M170 Bilateral primary osteoarthritis of knee: Secondary | ICD-10-CM

## 2023-01-11 DIAGNOSIS — E7800 Pure hypercholesterolemia, unspecified: Secondary | ICD-10-CM

## 2023-01-11 DIAGNOSIS — R102 Pelvic and perineal pain: Secondary | ICD-10-CM

## 2023-01-11 DIAGNOSIS — M2550 Pain in unspecified joint: Secondary | ICD-10-CM

## 2023-01-11 DIAGNOSIS — F32A Depression: Secondary | ICD-10-CM

## 2023-01-11 DIAGNOSIS — I10 Essential (primary) hypertension: Secondary | ICD-10-CM

## 2023-01-11 DIAGNOSIS — Z8249 Family history of ischemic heart disease and other diseases of the circulatory system: Secondary | ICD-10-CM

## 2023-01-11 NOTE — Patient Instructions
Please do not hesitate to contact my office with any questions.    Dr. Bryan Vopat, Lyndsey Augur PA-C & Stephanie Caldwell PA-C - Orthopedic Surgeon, Sports Medicine  [facility] - Phone [phone] - Fax [phone]   [address], [address] - [place], [place] [address]    To schedule an appointment, please call our scheduling line at [phone]    Zackeriah Kissler BSN, RN - Clinical Nurse Coordinator  Casey Conover BSN, RN - Clinical Nurse Coordinator  Stephanie Adamson ATC, LAT - Clinical Athletic Trainer    Thank you for supporting our practice! Your feedback helps us deliver the highest quality of care.  Review us at: [email or web address]

## 2023-01-12 NOTE — Telephone Encounter
Faced pelvic floor therapy referral to [facility] at [phone]

## 2023-01-25 ENCOUNTER — Encounter: Admit: 2023-01-25 | Discharge: 2023-01-25 | Payer: MEDICARE

## 2023-01-26 ENCOUNTER — Encounter: Admit: 2023-01-26 | Discharge: 2023-01-26 | Payer: MEDICARE

## 2023-01-26 NOTE — Telephone Encounter
Yes, plan for dilation and bravo.   thx

## 2023-01-28 NOTE — Progress Notes
Radiation Oncology Follow Up Note  Date: 02/03/2023       Ivona Cooksey is a 62 y.o. female.     The primary encounter diagnosis was Malignant neoplasm of central portion of left breast in female, estrogen receptor positive . A diagnosis of Radiotherapy follow-up was also pertinent to this visit.  Staging:  Cancer Staging   Malignant neoplasm of left breast in female, estrogen receptor positive (HCC)  Staging form: Breast, AJCC 8th Edition  - Clinical stage from 12/22/2020: Stage IA (cT1c, cN0, cM0, G1, ER+, PR+, HER2-) - Signed by Massie Kluver, MD on 12/22/2020  - Pathologic stage from 02/02/2021: Stage IIIA (pT3, pN3, cM0, G1, ER+, PR+, HER2-) - Signed by Massie Kluver, MD on 05/27/2021      History of Present Illness  Treating Physicians: Drs. Geralyn Corwin     Diagnosis:   62 y.o. female with cT1cN0 ILC of left breast (ER+ HER2- with Ki-67 of 2-5%) status post bilateral mastectomy and L SLNB. Pathology showed 7.4 cm grade 1 ILC, resected with ideal margins, negative LVSI, 5/5 sentinel lymph nodes involved (largest metastatic deposit measuring 1.8 cm with positive ECE). She completed adjuvant ddAC then had left ALND for which pathology revealed 6/14 positive lymph nodes (total of 11/19 positive lymph nodes). Stage mpT3N3a. She was referred for a discussion regarding adjuvant radiotherapy.      Treatment History:   Left CW + Nodes (3D): 06/10/21 - 07/01/21  266 cGy in 16 fractions. 4256 cGy in total.       07/01/2021   Course ID C1-Left CW   Plan ID Lt [id number]   Prescribed Dose per Fraction 2.66   Fractions Treated to Date 16   Total Fractions on Plan 16   Reference Point ID Lt Sclav   Session Dosage Given 2.66   First Treatment Date 06-10-2021 02:17PM   Last Treatment Date 07-01-2021 10:36AM   Treatment Elapsed Days 21      Trial: IIT Hypofractionated radiation therapy for patients with breast cancer receiving regional nodal irradiation.     Interruptions: No     RX during treatment: SSD, Magic Mouthwash, and Tramadol      Patient underwent adjuvant radiotherapy to her left breast to 4256 cGy in 16 fractions using 3D conformal radiation treatment planning. She tolerated treatment well and had no treatment interruptions. As expected, she had odynophagia, mild erythema of treatment area without desquamation. She was prescribed Magic Mouthwash, SSD cream and 2.5% hydrocortisone cream.     Subjective:          [name] returns today for routine follow-up. She is unaccompanied to today's visit. Doing well. Feels like she is really starting to get back to being herself after treatment. She has started intermittent fasting and notes significant improvement in her energy levels as well as decreased brain fog and hot flashes. She has been able to wean off her Lexapro too. She is even starting to go to the gym. She has some tightness of her chest wall though no significant discomfort. Range of motion of her shoulder is good. She does have a hiatal hernia and has had worsening GERD. She is off her PPI now in anticipation of another procedure and is having some symptoms but fortunately manageable currently. Patient denies shortness of air, cough or chills. Some headaches but no other neurologic symptoms. She is entirely off endocrine therapy as she did not tolerate despite attempting 3 different medications over a 1 year period of time.  Review of Systems   Constitutional:  Positive for fatigue. Negative for fever.   HENT:  Negative for sore throat and trouble swallowing.    Eyes:  Negative for visual disturbance.   Respiratory:  Negative for cough and shortness of breath.    Cardiovascular:  Negative for chest pain.   Gastrointestinal:         GERD   Musculoskeletal:  Negative for arthralgias and myalgias.   Skin:  Negative for color change.   Neurological:  Negative for light-headedness and headaches.   Hematological:  Negative for adenopathy.   Psychiatric/Behavioral:  The patient is not nervous/anxious.    All other systems reviewed and are negative.      Objective:          acetaminophen (TYLENOL) 325 mg tablet Take two tablets by mouth every 6 hours. Take scheduled for 3 days after surgery, then as needed. Do not exceed 4,000mg  in a 24 hour period.    amLODIPine (NORVASC) 5 mg tablet Take one tablet by mouth daily.    aspirin 81 mg chewable tablet Chew one tablet by mouth daily.    biotin 1 mg cap Take one capsule by mouth daily.    CALCIUM PO Take 600 mg by mouth daily.    CHOLEcalciferoL (vitamin D3) 1,000 units tablet Take one tablet by mouth daily. Takes 5000 units    clobetasoL (TEMOVATE) 0.05 % topical ointment Apply  topically to affected area twice daily. Apply to areas of rash on hands twice daily; do NOT use on face/groin/underarms, use up to 2 weeks per month    diclofenac sodium (VOLTAREN) 1 % topical gel Apply four g topically to affected area daily as needed.    ezetimibe (ZETIA) 10 mg tablet Take one tablet by mouth daily.    Fish,Bora,Flax Oils-OM3,6,9 #1 (TRIPLE OMEGA) 400-400-400 mg capsule Take one capsule by mouth daily.    metoprolol succinate XL (TOPROL XL) 25 mg extended release tablet Take one tablet by mouth at bedtime daily.    omeprazole DR (PRILOSEC) 40 mg capsule Take one capsule by mouth daily.    other medication K Complete K1 & K2 as MK-4 & MK-7 : Take 1 softgel by mouth once daily    rosuvastatin (CRESTOR) 10 mg tablet Take one tablet by mouth daily.    Selenium 100 mcg tab Take one tablet by mouth daily.    SYNTHROID 100 mcg tablet Take one tablet by mouth daily 30 minutes before breakfast.    traMADoL 25 mg tablet Take one tablet by mouth every 12 hours as needed.    vitamins, B complex tab Take one tablet by mouth daily.    vitamins, multiple cap Take one capsule by mouth daily.    zinc sulfate 220 mg (50 mg elemental zinc) capsule Take one capsule by mouth daily.     Vitals:    02/03/23 0915   BP: (!) 151/96   BP Source: Arm, Right Upper   Pulse: 73   Temp: 37.2 ?C (98.9 ?F)   Resp: 18   SpO2: 100%   TempSrc: Oral   PainSc: Zero   Weight: 71.7 kg (158 lb)     Body mass index is 28.89 kg/m?Marland Kitchen     Pain Score: Zero        Fatigue Scale: 0-None    KARNOFSKY PERFORMANCE SCORE:  100% Normal, no complaints     Physical Exam  Vitals reviewed.   Constitutional:       General: She is  awake. She is not in acute distress.     Appearance: Normal appearance. She is well-developed. She is not ill-appearing.   HENT:      Head: Normocephalic and atraumatic.      Right Ear: External ear normal.      Left Ear: External ear normal.   Eyes:      General: Lids are normal.      Extraocular Movements: Extraocular movements intact.      Conjunctiva/sclera: Conjunctivae normal.   Cardiovascular:      Rate and Rhythm: Normal rate and regular rhythm.      Heart sounds: Normal heart sounds.   Pulmonary:      Effort: Pulmonary effort is normal. No respiratory distress.      Breath sounds: Normal breath sounds.   Chest:   Breasts:     Right: Absent.      Left: Absent.      Comments: Bilateral mastectomy with flat closure. No notable abnormalities of bilateral chest wall.     Musculoskeletal:      Cervical back: Neck supple.   Lymphadenopathy:      Cervical: No cervical adenopathy.      Upper Body:      Right upper body: No supraclavicular or axillary adenopathy.      Left upper body: No supraclavicular or axillary adenopathy.   Skin:     General: Skin is warm and dry.      Coloration: Skin is not pale.   Neurological:      General: No focal deficit present.      Mental Status: She is alert and oriented to person, place, and time. Mental status is at baseline.      Cranial Nerves: Cranial nerves 2-12 are intact. No cranial nerve deficit.      Sensory: Sensation is intact.      Motor: Motor function is intact.   Psychiatric:         Attention and Perception: Attention normal.         Mood and Affect: Mood and affect normal.         Speech: Speech normal.         Behavior: Behavior normal.         Cognition and Memory: Cognition and memory normal.            Laboratory:    Comprehensive Metabolic Profile    Lab Results   Component Value Date/Time    NA 140 09/30/2022 08:40 AM    K 4.2 09/30/2022 08:40 AM    CL 104 09/30/2022 08:40 AM    CO2 26 09/30/2022 08:40 AM    GAP 10 09/30/2022 08:40 AM    BUN 10 09/30/2022 08:40 AM    CR 0.8 12/31/2022 07:20 AM    CR 0.68 09/30/2022 08:40 AM    GLU 98 09/30/2022 08:40 AM    Lab Results   Component Value Date/Time    CA 9.2 09/30/2022 08:40 AM    PO4 3.6 11/19/2022 09:18 AM    ALBUMIN 4.6 09/30/2022 08:40 AM    TOTPROT 7.3 09/30/2022 08:40 AM    ALKPHOS 45 09/30/2022 08:40 AM    AST 29 09/30/2022 08:40 AM    ALT 34 09/30/2022 08:40 AM    TOTBILI 0.4 09/30/2022 08:40 AM        CBC w diff    Lab Results   Component Value Date/Time    WBC 7.1 09/06/2022 10:34 AM    RBC 3.88 (L)  09/06/2022 10:34 AM    HGB 12.0 09/06/2022 10:34 AM    HCT 35.8 (L) 09/06/2022 10:34 AM    MCV 92.2 09/06/2022 10:34 AM    MCH 30.9 09/06/2022 10:34 AM    MCHC 33.4 09/06/2022 10:34 AM    RDW 13.5 09/06/2022 10:34 AM    PLTCT 204 09/06/2022 10:34 AM    MPV 7.8 09/06/2022 10:34 AM    Lab Results   Component Value Date/Time    NEUT 66 09/06/2022 10:34 AM    ANC 4.70 09/06/2022 10:34 AM    LYMA 17 (L) 09/06/2022 10:34 AM    ALC 1.20 09/06/2022 10:34 AM    MONA 12 09/06/2022 10:34 AM    AMC 0.90 (H) 09/06/2022 10:34 AM    EOSA 5 09/06/2022 10:34 AM    AEC 0.30 09/06/2022 10:34 AM    BASA 0 09/06/2022 10:34 AM    ABC 0.00 09/06/2022 10:34 AM        Imaging:   HAND MIN 3 VIEWS LEFT  Narrative: Exam: HAND MIN 3 VIEWS LEFT    CLINICAL INDICATION: osteoarthritis. Months of bilateral hand. Denies inciting injury. History of old internally fixed left thumb fracture.    COMPARISON: None.  Impression: FINDINGS\IMPRESSION:    1.  No acute osseous abnormality.  2.  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 Additional scattered mild PIP and DIP joint osteoarthritis.   3.  Mild ulnar positive variance. 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  4.  Intact fixation screw from remote first metacarpal fracture.  5.  Diffuse osseous demineralization.    By my electronic signature, I attest that I have personally reviewed the images for this examination and formulated the interpretations and opinions expressed in this report     Finalized by Ranee Gosselin, M.D. on 01/07/2023 11:48 AM. Dictated by Caron Presume, DO on 01/07/2023 10:52 AM.  MRI HEAD WO/W CONTRAST  Narrative: MRI head with and without contrast    Reason for exam: Headache, breast cancer    Multisequence multiplanar MRI of the head was performed in a head coil pre and post IV contrast administration.    Ventricular and subarachnoid spaces are normal in size.  Multiple tiny foci of FLAIR hyperintensity are present in deep and subcortical white matter of the cerebral hemispheres.  Circumscribed 6 x 14 x 5 mm FLAIR hyperintensity in posterior right insula.  There is no abnormal contrast enhancement.  Small developmental venous anomaly is present in anterior medial right frontal lobe.  No intra or extra-axial hemorrhage or mass effect are visible.  No increased diffusion signal is present.  Pituitary gland is normal in size.  Orbits have a normal appearance.  Paranasal sinuses are well aerated.  Impression: 1. No evidence of cerebral metastasis.  2. Multiple mostly tiny white matter FLAIR hyperintensities compatible with small vessel disease including chronic migraine.     Finalized by Thayer Headings, M.D. on 01/07/2023 9:50 AM. Dictated by Thayer Headings, M.D. on 01/07/2023 9:44 AM.       Path:  PATHOLOGY REPORT   Date Value Ref Range Status   03/24/2022   Final    [facility]  [email or web address]    Department of Pathology and Laboratory Medicine  [address], [place], [place] [address]  Surgical Pathology Office:  [phone]  Fax:  [phone]  SURGICAL PATHOLOGY REPORT    NAME: KEILEE, LEMMON. SURG PATH #: [id number] MR #: [id number] SPECIMEN  CLASS: SI BILLING #: [id number] ALT ID #:  LOCATION: [id number] DATE OF  PROCEDURE: 03/24/2022 AGE:  61 SEX: F DATE RECEIVED: 03/24/2022 DOB:  05-01-1961  TIME RECEIVED:  09:57 PHYSICIAN: Odie Sera, MD DATE OF  REPORT: 03/26/2022 COPY TO: Ferrel Logan, DO DATE OF PRINTING: 03/26/2022         ########################################################################  Final Diagnosis:    A. Lymph node, right axilla, biopsy:    Fragments of lymph node, negative for carcinoma. See comment.    Comment:  Immunohistochemical stain for pancytokeratin (performed on block A1) is  negative. Deeper levels were utilized in evaluation, supporting above  diagnosis.    Pursuant to the Quality Assurance Program at the [facility] Pathology Department, selected slides from this case have been  concurrently reviewed by the following pathologist: Dr. Milbert Coulter who agrees  with the final diagnosis.       Attestation:  By this signature, I attest that I have personally formulated the final  interpretation expressed in this report and that the above diagnosis is  based upon my examination of the slides and/or other material indicated in  this report.    +++Electronically Signed Out By Sonda Primes, MD on 03/26/2022+++  Roger Shelter, DO, Resident                 pq/03/25/2022             ########################################################################  Material Received:  A: right axilla    History:  62 year old patient with a history of metastatic left invasive lobular  carcinoma status-post mastectomy.         Gross Description:  A. Received in formalin labeled right axilla is a 1.4 x 0.8 x 0.2 cm  aggregate of cylindrical yellow-tan, bloodstained, fatty tissue fragments.  The specimen is entirely submitted in A1. The breast biopsy is removed  from the patient at 08:22 on 03/24/2022, placed in formalin at 08:27 on  03/24/2022, and not removed from formalin until 23:40 on 03/24/2022. (jm)    jm/03/24/2022           If immunohistochemical stains and/or in situ hybridization are cited in  this report, the performance characteristics were determined by the  Department of Pathology and Laboratory Medicine of the [facility]  [facility]) in compliance with CLIA'88  regulations. Some of these tests rely on the use of analyte specific  reagents and are subject to specific labeling requirements by the FDA.  The stains are performed on formalin-fixed, paraffin-embedded tissue,  unless otherwise stated. Known positive and negative control tissues  demonstrate appropriate staining. Results should be interpreted with  caution given the likelihood of false negativity on decalcified specimens.  This testing was developed by the Department of Pathology and Laboratory  Medicine of the [facility] [place]. It has not been cleared or approved  by the FDA.  The FDA has determined that such clearance or approval is not  necessary.    Testing performed by the [facility] at [address] [place], [place] [address]. CLIA # [id number].     ]        Assessment and Plan:  Caran Millson is a 62 y.o. female with cT1cN0 ILC of left breast (ER+ HER2- with Ki-67 of 2-5%) status post bilateral mastectomy and L SLNB. Pathology showed 7.4 cm grade 1 ILC, resected with ideal margins, negative LVSI, 5/5 sentinel lymph nodes involved (largest metastatic deposit measuring 1.8 cm with positive ECE). She completed adjuvant ddAC then had  left ALND for which pathology revealed 6/14 positive lymph nodes (total of 11/19 positive lymph nodes). Stage mpT3N3a. She enrolled in the IIT Hypofractionation clinical trial and received adjuvant radiotherapy to her left breast to 4256 cGy in 16 fractions using 3D conformal radiation treatment planning during 06/10/21 - 07/01/21.      - Marcell Barlow was seen today for routine follow-up after completion of radiation therapy.  At this time, she is clinically stable with no clinical evidence of disease recurrence or progression.  She has no subacute toxicities from having undergone adjuvant radiation.  - CT c/a/p 12/31/2022 with no evidence of metastatic disease. MRI head 01/07/2023 with no intracranial metastatic disease  - Our plan will be for the patient to return to our clinic in 1 year for continued follow-up.  She may contact us in the interim if there are any questions or concerns requiring earlier assessment.  -The patient will keep their appointments with their other managing providers including medical oncology, Dr. Donneta Romberg. She continues on anastrozole.            Total time for today's visit was 28 minutes. Visit time spent on the following: preparing to see the patient, obtaining and/or reviewing separately obtained history/information, performing a physical examination and/or evaluation, counseling and educating the patient/family/caregiver, referring to and communication with other health care professionals, documenting clinical information in the electronic or other health record and care coordination.     Liston Alba AGNP-C  [facility] - Radiation Oncology  [address]  [place], [place] [address]    Collaborating Physician: Bosie Helper, MD    Parts of this note were created using voice recognition software.  Please excuse any grammatical or typographical errors.

## 2023-01-31 ENCOUNTER — Encounter: Admit: 2023-01-31 | Discharge: 2023-01-31 | Payer: MEDICARE

## 2023-02-02 ENCOUNTER — Ambulatory Visit: Admit: 2023-02-02 | Discharge: 2023-02-02 | Payer: MEDICARE

## 2023-02-02 ENCOUNTER — Encounter: Admit: 2023-02-02 | Discharge: 2023-02-02 | Payer: MEDICARE

## 2023-02-02 DIAGNOSIS — E890 Postprocedural hypothyroidism: Secondary | ICD-10-CM

## 2023-02-02 DIAGNOSIS — R32 Unspecified urinary incontinence: Secondary | ICD-10-CM

## 2023-02-02 LAB — URINALYSIS DIPSTICK REFLEX TO CULTURE
GLUCOSE,UA: NEGATIVE
LEUKOCYTES: NEGATIVE
NITRITE: NEGATIVE
PROTEIN,UA: NEGATIVE
URINE ASCORBIC ACID, UA: NEGATIVE
URINE BILE: NEGATIVE
URINE BLOOD: NEGATIVE
URINE KETONE: NEGATIVE
URINE PH: 7 (ref 5.0–8.0)
URINE SPEC GRAVITY: 1 (ref 1.005–1.030)

## 2023-02-02 LAB — URINALYSIS MICROSCOPIC REFLEX TO CULTURE

## 2023-02-02 LAB — TSH WITH FREE T4 REFLEX: TSH: 12 uU/mL — ABNORMAL HIGH (ref 0.35–5.00)

## 2023-02-02 LAB — FREE T4-FREE THYROXINE: FREE T4: 0.8 ng/dL (ref 0.6–1.6)

## 2023-02-03 ENCOUNTER — Encounter: Admit: 2023-02-03 | Discharge: 2023-02-03 | Payer: MEDICARE

## 2023-02-03 ENCOUNTER — Ambulatory Visit: Admit: 2023-02-03 | Discharge: 2023-02-03 | Payer: MEDICARE

## 2023-02-03 DIAGNOSIS — C50919 Malignant neoplasm of unspecified site of unspecified female breast: Secondary | ICD-10-CM

## 2023-02-03 DIAGNOSIS — F419 Anxiety disorder, unspecified: Secondary | ICD-10-CM

## 2023-02-03 DIAGNOSIS — E785 Hyperlipidemia, unspecified: Secondary | ICD-10-CM

## 2023-02-03 DIAGNOSIS — Z789 Other specified health status: Secondary | ICD-10-CM

## 2023-02-03 DIAGNOSIS — C73 Malignant neoplasm of thyroid gland: Secondary | ICD-10-CM

## 2023-02-03 DIAGNOSIS — Z9013 Acquired absence of bilateral breasts and nipples: Secondary | ICD-10-CM

## 2023-02-03 DIAGNOSIS — R011 Cardiac murmur, unspecified: Secondary | ICD-10-CM

## 2023-02-03 DIAGNOSIS — Z8249 Family history of ischemic heart disease and other diseases of the circulatory system: Secondary | ICD-10-CM

## 2023-02-03 DIAGNOSIS — R32 Unspecified urinary incontinence: Secondary | ICD-10-CM

## 2023-02-03 DIAGNOSIS — M5136 Other intervertebral disc degeneration, lumbar region: Secondary | ICD-10-CM

## 2023-02-03 DIAGNOSIS — E890 Postprocedural hypothyroidism: Secondary | ICD-10-CM

## 2023-02-03 DIAGNOSIS — K219 Gastro-esophageal reflux disease without esophagitis: Secondary | ICD-10-CM

## 2023-02-03 DIAGNOSIS — C50112 Malignant neoplasm of central portion of left female breast: Secondary | ICD-10-CM

## 2023-02-03 DIAGNOSIS — M5134 Other intervertebral disc degeneration, thoracic region: Secondary | ICD-10-CM

## 2023-02-03 DIAGNOSIS — E079 Disorder of thyroid, unspecified: Secondary | ICD-10-CM

## 2023-02-03 DIAGNOSIS — F32A Depression: Secondary | ICD-10-CM

## 2023-02-03 DIAGNOSIS — B999 Unspecified infectious disease: Secondary | ICD-10-CM

## 2023-02-03 DIAGNOSIS — Z85850 Personal history of malignant neoplasm of thyroid: Secondary | ICD-10-CM

## 2023-02-03 DIAGNOSIS — W19XXXA Unspecified fall, initial encounter: Secondary | ICD-10-CM

## 2023-02-03 DIAGNOSIS — F3289 Other specified depressive episodes: Secondary | ICD-10-CM

## 2023-02-03 DIAGNOSIS — D539 Nutritional anemia, unspecified: Secondary | ICD-10-CM

## 2023-02-03 DIAGNOSIS — E039 Hypothyroidism, unspecified: Secondary | ICD-10-CM

## 2023-02-03 DIAGNOSIS — Z923 Personal history of irradiation: Secondary | ICD-10-CM

## 2023-02-03 DIAGNOSIS — M4800 Spinal stenosis, site unspecified: Secondary | ICD-10-CM

## 2023-02-03 DIAGNOSIS — I10 Essential (primary) hypertension: Secondary | ICD-10-CM

## 2023-02-03 DIAGNOSIS — M810 Age-related osteoporosis without current pathological fracture: Secondary | ICD-10-CM

## 2023-02-03 DIAGNOSIS — IMO0002 Ulcer: Secondary | ICD-10-CM

## 2023-02-03 DIAGNOSIS — E7800 Pure hypercholesterolemia, unspecified: Secondary | ICD-10-CM

## 2023-02-03 DIAGNOSIS — C801 Malignant (primary) neoplasm, unspecified: Secondary | ICD-10-CM

## 2023-02-03 DIAGNOSIS — S83209A Unspecified tear of unspecified meniscus, current injury, unspecified knee, initial encounter: Secondary | ICD-10-CM

## 2023-02-03 DIAGNOSIS — M549 Dorsalgia, unspecified: Secondary | ICD-10-CM

## 2023-02-03 DIAGNOSIS — Z09 Encounter for follow-up examination after completed treatment for conditions other than malignant neoplasm: Secondary | ICD-10-CM

## 2023-02-03 DIAGNOSIS — R519 Generalized headaches: Secondary | ICD-10-CM

## 2023-02-03 DIAGNOSIS — M5030 Other cervical disc degeneration, unspecified cervical region: Secondary | ICD-10-CM

## 2023-02-03 DIAGNOSIS — M2550 Pain in unspecified joint: Secondary | ICD-10-CM

## 2023-02-04 ENCOUNTER — Encounter: Admit: 2023-02-04 | Discharge: 2023-02-04 | Payer: MEDICARE

## 2023-02-04 ENCOUNTER — Ambulatory Visit: Admit: 2023-02-04 | Discharge: 2023-02-05 | Payer: MEDICARE

## 2023-02-04 MED ORDER — PREGABALIN 50 MG PO CAP
50 mg | ORAL_CAPSULE | Freq: Every evening | ORAL | 1 refills | Status: AC
Start: 2023-02-04 — End: ?

## 2023-02-05 DIAGNOSIS — M5416 Radiculopathy, lumbar region: Secondary | ICD-10-CM

## 2023-02-07 ENCOUNTER — Encounter: Admit: 2023-02-07 | Discharge: 2023-02-07 | Payer: MEDICARE

## 2023-02-08 ENCOUNTER — Encounter: Admit: 2023-02-08 | Discharge: 2023-02-08 | Payer: MEDICARE

## 2023-02-08 ENCOUNTER — Ambulatory Visit: Admit: 2023-02-08 | Discharge: 2023-02-08 | Payer: MEDICARE

## 2023-02-08 DIAGNOSIS — E890 Postprocedural hypothyroidism: Secondary | ICD-10-CM

## 2023-02-08 DIAGNOSIS — F3289 Other specified depressive episodes: Secondary | ICD-10-CM

## 2023-02-08 DIAGNOSIS — Z789 Other specified health status: Secondary | ICD-10-CM

## 2023-02-08 DIAGNOSIS — R32 Unspecified urinary incontinence: Secondary | ICD-10-CM

## 2023-02-08 DIAGNOSIS — R011 Cardiac murmur, unspecified: Secondary | ICD-10-CM

## 2023-02-08 DIAGNOSIS — R519 Generalized headaches: Secondary | ICD-10-CM

## 2023-02-08 DIAGNOSIS — M549 Dorsalgia, unspecified: Secondary | ICD-10-CM

## 2023-02-08 DIAGNOSIS — IMO0002 Ulcer: Secondary | ICD-10-CM

## 2023-02-08 DIAGNOSIS — B999 Unspecified infectious disease: Secondary | ICD-10-CM

## 2023-02-08 DIAGNOSIS — K219 Gastro-esophageal reflux disease without esophagitis: Secondary | ICD-10-CM

## 2023-02-08 DIAGNOSIS — E079 Disorder of thyroid, unspecified: Secondary | ICD-10-CM

## 2023-02-08 DIAGNOSIS — K449 Diaphragmatic hernia without obstruction or gangrene: Secondary | ICD-10-CM

## 2023-02-08 DIAGNOSIS — E039 Hypothyroidism, unspecified: Secondary | ICD-10-CM

## 2023-02-08 DIAGNOSIS — F32A Depression: Secondary | ICD-10-CM

## 2023-02-08 DIAGNOSIS — C73 Malignant neoplasm of thyroid gland: Secondary | ICD-10-CM

## 2023-02-08 DIAGNOSIS — M5136 Other intervertebral disc degeneration, lumbar region: Secondary | ICD-10-CM

## 2023-02-08 DIAGNOSIS — I10 Essential (primary) hypertension: Secondary | ICD-10-CM

## 2023-02-08 DIAGNOSIS — M4800 Spinal stenosis, site unspecified: Secondary | ICD-10-CM

## 2023-02-08 DIAGNOSIS — M2550 Pain in unspecified joint: Secondary | ICD-10-CM

## 2023-02-08 DIAGNOSIS — E7800 Pure hypercholesterolemia, unspecified: Secondary | ICD-10-CM

## 2023-02-08 DIAGNOSIS — S83209A Unspecified tear of unspecified meniscus, current injury, unspecified knee, initial encounter: Secondary | ICD-10-CM

## 2023-02-08 DIAGNOSIS — Z8249 Family history of ischemic heart disease and other diseases of the circulatory system: Secondary | ICD-10-CM

## 2023-02-08 DIAGNOSIS — E785 Hyperlipidemia, unspecified: Secondary | ICD-10-CM

## 2023-02-08 DIAGNOSIS — D539 Nutritional anemia, unspecified: Secondary | ICD-10-CM

## 2023-02-08 DIAGNOSIS — C50919 Malignant neoplasm of unspecified site of unspecified female breast: Secondary | ICD-10-CM

## 2023-02-08 DIAGNOSIS — M5030 Other cervical disc degeneration, unspecified cervical region: Secondary | ICD-10-CM

## 2023-02-08 DIAGNOSIS — M810 Age-related osteoporosis without current pathological fracture: Secondary | ICD-10-CM

## 2023-02-08 DIAGNOSIS — F419 Anxiety disorder, unspecified: Secondary | ICD-10-CM

## 2023-02-08 DIAGNOSIS — M5134 Other intervertebral disc degeneration, thoracic region: Secondary | ICD-10-CM

## 2023-02-08 DIAGNOSIS — Z9013 Acquired absence of bilateral breasts and nipples: Secondary | ICD-10-CM

## 2023-02-08 DIAGNOSIS — W19XXXA Unspecified fall, initial encounter: Secondary | ICD-10-CM

## 2023-02-08 DIAGNOSIS — C801 Malignant (primary) neoplasm, unspecified: Secondary | ICD-10-CM

## 2023-02-08 DIAGNOSIS — Z85850 Personal history of malignant neoplasm of thyroid: Secondary | ICD-10-CM

## 2023-02-08 DIAGNOSIS — Z923 Personal history of irradiation: Secondary | ICD-10-CM

## 2023-02-08 MED ORDER — LACTATED RINGERS IV SOLP
INTRAVENOUS | 0 refills | Status: DC
Start: 2023-02-08 — End: 2023-02-08

## 2023-02-08 MED ORDER — PROPOFOL INJ 10 MG/ML IV VIAL
INTRAVENOUS | 0 refills | Status: DC
Start: 2023-02-08 — End: 2023-02-08

## 2023-02-08 MED ORDER — FENTANYL CITRATE (PF) 50 MCG/ML IJ SOLN
INTRAVENOUS | 0 refills | Status: DC
Start: 2023-02-08 — End: 2023-02-08

## 2023-02-08 MED ORDER — PROPOFOL 10 MG/ML IV EMUL 20 ML (INFUSION)(AM)(OR)
INTRAVENOUS | 0 refills | Status: DC
Start: 2023-02-08 — End: 2023-02-08
  Administered 2023-02-08: 17:00:00 100 ug/kg/min via INTRAVENOUS

## 2023-02-08 MED ORDER — LIDOCAINE (PF) 20 MG/ML (2 %) IJ SOLN
INTRAVENOUS | 0 refills | Status: DC
Start: 2023-02-08 — End: 2023-02-08

## 2023-02-08 MED ADMIN — ACETAMINOPHEN 500 MG PO TAB [102]: 500 mg | ORAL | @ 18:00:00 | Stop: 2023-02-08 | NDC 00904673061

## 2023-02-08 NOTE — Anesthesia Post-Procedure Evaluation
Post-Anesthesia Evaluation    Name: Cindy Randolph      MRN: [id number]     DOB: 1961-02-09     Age: 62 y.o.     Sex: female   __________________________________________________________________________     Procedure Information       Anesthesia Start Date/Time: 02/08/23 1223    Procedures:       ESOPHAGOGASTRODUODENOSCOPY WITH BIOPSY - FLEXIBLE      GASTROESOPHAGEAL REFLUX TEST WITH MUCOSAL ATTACHED TELEMETRY PH ELECTE      ESOPHAGOGASTRODUODENOSCOPY WITH DILATION GASTRIC/ DUODENAL STRICTURE - FLEXIBLE    Location: ENDO 6 (IR) / ENDO/GI    Surgeons: Jolee Ewing, MD            Post-Anesthesia Vitals  BP: 132/81 (09/17 1250)  Temp: 36.6 ?C (97.8 ?F) (09/17 1240)  Pulse: 83 (09/17 1250)  Respirations: 16 PER MINUTE (09/17 1250)  SpO2: 100 % (09/17 1250)  O2 Device: None (Room air) (09/17 1250)   Vitals Value Taken Time   BP 132/81 02/08/23 1250   Temp 36.6 ?C (97.8 ?F) 02/08/23 1240   Pulse 83 02/08/23 1250   Respirations 16 PER MINUTE 02/08/23 1250   SpO2 100 % 02/08/23 1250   O2 Device None (Room air) 02/08/23 1250   ABP     ART BP           PostAnes Eval    Perioperative Events  There were no known complications for this encounter.

## 2023-02-08 NOTE — Anesthesia Pre-Procedure Evaluation
Anesthesia Pre-Procedure Evaluation    Name: Cindy Randolph      MRN: [id number]     DOB: [birth year]     Age: 62 y.o.     Sex: female   _________________________________________________________________________     Procedure Info:   Procedure Information       Date/Time: 02/08/23 1200    Procedures:       ESOPHAGOGASTRODUODENOSCOPY WITH BIOPSY - FLEXIBLE      GASTROESOPHAGEAL REFLUX TEST WITH MUCOSAL ATTACHED TELEMETRY PH ELECTE      ESOPHAGOGASTRODUODENOSCOPY WITH DILATION GASTRIC/ DUODENAL STRICTURE - FLEXIBLE    Location: ENDO 6 (IR) / ENDO/GI    Surgeons: Jolee Ewing, MD            Physical Assessment  Vital Signs (last filed in past 24 hours):         Patient History   Allergies   Allergen Reactions    Mango ANAPHYLAXIS    Other [Unclassified Drug] ANAPHYLAXIS     DUCK Meat and Eggs     Imdur [Isosorbide Mononitrate] CHEST TIGHTNESS    Iodinated Contrast Media RASH     She developed rash on back and neck after hospitalization 06/2022 where she underwent heart cath. Unsure if contrast was cause of rash, but it developed shortly after procedure.    Oxycodone NAUSEA ONLY     Prefers tramadol    Sudafed [Pseudoephedrine Hcl] PALPITATIONS        Current Medications    Medication Directions   acetaminophen (TYLENOL) 325 mg tablet Take two tablets by mouth every 6 hours. Take scheduled for 3 days after surgery, then as needed. Do not exceed 4,000mg  in a 24 hour period.   amLODIPine (NORVASC) 5 mg tablet Take one tablet by mouth daily.   aspirin 81 mg chewable tablet Chew one tablet by mouth daily.   biotin 1 mg cap Take one capsule by mouth daily.   CALCIUM PO Take 600 mg by mouth daily.   CHOLEcalciferoL (vitamin D3) 1,000 units tablet Take one tablet by mouth daily. Takes 5000 units   clobetasoL (TEMOVATE) 0.05 % topical ointment Apply  topically to affected area twice daily. Apply to areas of rash on hands twice daily; do NOT use on face/groin/underarms, use up to 2 weeks per month   diclofenac sodium (VOLTAREN) 1 % topical gel Apply four g topically to affected area daily as needed.   ezetimibe (ZETIA) 10 mg tablet Take one tablet by mouth daily.   Fish,Bora,Flax Oils-OM3,6,9 #1 (TRIPLE OMEGA) 400-400-400 mg capsule Take one capsule by mouth daily.   metoprolol succinate XL (TOPROL XL) 25 mg extended release tablet Take one tablet by mouth at bedtime daily.   omeprazole DR (PRILOSEC) 40 mg capsule Take one capsule by mouth daily.   other medication K Complete K1 & K2 as MK-4 & MK-7 : Take 1 softgel by mouth once daily   pregabalin (LYRICA) 50 mg capsule Take one capsule by mouth at bedtime daily.   rosuvastatin (CRESTOR) 10 mg tablet Take one tablet by mouth daily.   Selenium 100 mcg tab Take one tablet by mouth daily.   SYNTHROID 100 mcg tablet Take one tablet by mouth daily 30 minutes before breakfast.   traMADoL 25 mg tablet Take one tablet by mouth every 12 hours as needed.   vitamins, B complex tab Take one tablet by mouth daily.   vitamins, multiple cap Take one capsule by mouth daily.   zinc sulfate 220 mg (50 mg elemental zinc)  capsule Take one capsule by mouth daily.         Review of Systems/Medical History      Patient summary reviewed  Pertinent labs reviewed    PONV Screening: Non-smoker and Female sex    No history of anesthetic complications    No family history of anesthetic complications      Airway - negative        Pulmonary       Not a current smoker        No indications/hx of asthma      Cardiovascular       Recent diagnostic studies:          echocardiogram            TTE 01/2021:  ? Left Ventricle: The left ventricular size is normal. Concentric remodeling. The left ventricular systolic function is normal. The ejection fraction by Simpson's biplane method is 55%. The global longitudinal strain is -17%. There are no segmental wall motion abnormalities. Normal left ventricular diastolic function. Normal left atrial pressure.  ? Right Ventricle: The right ventricular size is normal. The right ventricular systolic function is normal.  ? Left Atrium: Normal size. Right Atrium: Normal size.  ? No hemodynamically significant valvular abnormalities.  ? The aortic root is normal in size. Mildly dilated ascending aorta.  ? Estimated Peak Systolic PA Pressure 22 mmHg  ? Normal central venous pressure      Exercise tolerance: >4 METS      Beta Blocker therapy: No      Beta blockers within 24 hours: n/a      Hypertension, well controlled          No past MI      No coronary artery bypass graft        No PTCA          Hyperlipidemia              GI/Hepatic/Renal         Hiatal hernia        GERD      Peptic ulcer disease (h/o)        No liver disease:         No renal disease:         Nausea      No vomiting        Neuro/Psych       No seizures        No CVA      Headaches        Psychiatric history          Depression          Anxiety      Musculoskeletal         Neck pain      Back pain      Arthritis:         Endocrine/Other       No diabetes        Hypothyroidism      No anemia        Malignancy (h/o breast and thyroid cancer):      Not obese      Constitution - negative       Physical Exam    Airway Findings      Mallampati: III      TM distance: >3 FB      Neck ROM: full      Mouth opening: good      Airway  patency: adequate    Dental Findings: Negative      Cardiovascular Findings:       Rhythm: regular      Rate: normal      No murmur    Pulmonary Findings:       Breath sounds clear to auscultation. No decreased breath sounds.    Abdominal Findings:       Not obese    Neurological Findings:       Alert and oriented x 3    Normal mental status    Constitutional findings:       No acute distress       Diagnostic Tests  Hematology:   Lab Results   Component Value Date    HGB 12.0 09/06/2022    HCT 35.8 09/06/2022    PLTCT 204 09/06/2022    WBC 7.1 09/06/2022    NEUT 66 09/06/2022    ANC 4.70 09/06/2022    LYMPH 11 04/20/2021    ALC 1.20 09/06/2022    MONA 12 09/06/2022    AMC 0.90 09/06/2022    EOSA 5 09/06/2022    ABC 0.00 09/06/2022    BASOPHILS 2 04/20/2021    MCV 92.2 09/06/2022    MCH 30.9 09/06/2022    MCHC 33.4 09/06/2022    MPV 7.8 09/06/2022    RDW 13.5 09/06/2022         General Chemistry:   Lab Results   Component Value Date    NA 140 09/30/2022    K 4.2 09/30/2022    CL 104 09/30/2022    CO2 26 09/30/2022    GAP 10 09/30/2022    BUN 10 09/30/2022    CR 0.8 12/31/2022    CR 0.68 09/30/2022    GLU 98 09/30/2022    CA 9.2 09/30/2022    ALBUMIN 4.6 09/30/2022    MG 2.0 09/06/2022    TOTBILI 0.4 09/30/2022    PO4 3.6 11/19/2022      Coagulation:   Lab Results   Component Value Date    PT 12.3 07/17/2022    INR 1.1 07/17/2022         Anesthesia Plan    ASA score: 2   Plan: MAC  Induction method: intravenous  NPO status: acceptable      Informed Consent  Anesthetic plan and risks discussed with patient.  Use of blood products discussed with patient      Plan discussed with: CRNA and anesthesiologist.    [facility] Plan  Alerts: No

## 2023-02-08 NOTE — Patient Instructions
Routine Clinic Information:    Make Checking in faster by using MyChart ahead of time by using the pre check in and complete the questionnaires. This will make your next visit Check in faster.      Please don't hesitate to call if you have any problems or questions.   My nurse, Tyreisha Ungar, RN, can be reached at [phone].  If she does not answer, please leave a voicemail as she is probably rooming other patients. Please leave your name, the spelling of your last name, date of birth, phone number they can call you back and a description of why you are calling. You may also message [place] in MyChart.    Our fax number is [phone].     Medication refills:  Please use the MyChart Refill request or contact your pharmacy directly to request medication refills.  Please allow at least 3 business days for refill requests  .  Lab work:  The main lab is on the 1st floor of the [facility].  [address], [address] [place], [place] [address]  LAB HOURS  Mon 7 a.m. - 6 p.m.  Tues 7 a.m. - 6 p.m.  Wed 7 a.m. - 6 p.m.  Thur 7 a.m. - 6 p.m.  Fri 7 a.m. - 6 p.m.  Sat 7 a.m. - noon  Sun Closed -  It is walk in only, they do not take appointments:  Other lab locations are available; please see [email or web address]    Test results:  You will receive your test results at your appointment, by MyChart (our patient portal), or via phone or letter.   We prefer to use MyChart as much as possible.  If you are expecting results and have not heard from my office within 2 weeks of your testing, please send a MyChart message or call my office.    As a part of the CARES act, starting 08/23/2019, some results will be released to mychart automatically.  With these changes you may see your results before I do.  Critical lab results will be addressed immediately, but otherwise please  give me 72 hours to view and respond to your results before reaching out with questions. Radiology is on the 2nd floor of the [address], among several other locations.  Please contact the radiology department to schedule at [phone].    Scheduling is available directly through MyChart, or via phone at [phone].  Same day and urgent care appointments are available.    We offer same day appointments for your acute health concerns. These appointments are on a first come, first serve basis. Please call [phone] if you would like to make an appointment.   Appointment reminders may be received over the phone and by text.  Communication preferences can be managed in MyChart to ensure you receive important appointment notifications.    Support for many chronic illnesses is available through [employer]: [employer] or [phone].    We offer Integrated Behavioral Health services, nutrition support, and pharmacist support services free of charge.  To schedule an appointment with a specialist and/or testing please call the central scheduling number at [phone].     You may see my nurse practitioner, Knute Neu, for urgent needs or if I am unavailable.  We are working as a team to provide better continuity and access to our patients.      If you ever have emergency symptoms of chest pain, shortness of breath or uncontrolled or unexplained pain, please go to  your closest emergency room.  You can give [place] an update after you have addressed any emergency.        For urgent issues after business hours/weekends/holidays call [phone] and request for the outpatient internal medicine physician to be paged.

## 2023-02-09 ENCOUNTER — Encounter: Admit: 2023-02-09 | Discharge: 2023-02-09 | Payer: MEDICARE

## 2023-02-10 ENCOUNTER — Ambulatory Visit: Admit: 2023-02-10 | Discharge: 2023-02-10 | Payer: MEDICARE

## 2023-02-10 ENCOUNTER — Encounter: Admit: 2023-02-10 | Discharge: 2023-02-10 | Payer: MEDICARE

## 2023-02-10 DIAGNOSIS — IMO0002 Ulcer: Secondary | ICD-10-CM

## 2023-02-10 DIAGNOSIS — F3289 Other specified depressive episodes: Secondary | ICD-10-CM

## 2023-02-10 DIAGNOSIS — I10 Essential (primary) hypertension: Secondary | ICD-10-CM

## 2023-02-10 DIAGNOSIS — R011 Cardiac murmur, unspecified: Secondary | ICD-10-CM

## 2023-02-10 DIAGNOSIS — M810 Age-related osteoporosis without current pathological fracture: Secondary | ICD-10-CM

## 2023-02-10 DIAGNOSIS — C801 Malignant (primary) neoplasm, unspecified: Secondary | ICD-10-CM

## 2023-02-10 DIAGNOSIS — E785 Hyperlipidemia, unspecified: Secondary | ICD-10-CM

## 2023-02-10 DIAGNOSIS — Z789 Other specified health status: Secondary | ICD-10-CM

## 2023-02-10 DIAGNOSIS — F32A Depression: Secondary | ICD-10-CM

## 2023-02-10 DIAGNOSIS — C50919 Malignant neoplasm of unspecified site of unspecified female breast: Secondary | ICD-10-CM

## 2023-02-10 DIAGNOSIS — M2550 Pain in unspecified joint: Secondary | ICD-10-CM

## 2023-02-10 DIAGNOSIS — M4800 Spinal stenosis, site unspecified: Secondary | ICD-10-CM

## 2023-02-10 DIAGNOSIS — K449 Diaphragmatic hernia without obstruction or gangrene: Secondary | ICD-10-CM

## 2023-02-10 DIAGNOSIS — E890 Postprocedural hypothyroidism: Secondary | ICD-10-CM

## 2023-02-10 DIAGNOSIS — Z0000 Encounter for general adult medical examination without abnormal findings: Secondary | ICD-10-CM

## 2023-02-10 DIAGNOSIS — Z85850 Personal history of malignant neoplasm of thyroid: Secondary | ICD-10-CM

## 2023-02-10 DIAGNOSIS — E7800 Pure hypercholesterolemia, unspecified: Secondary | ICD-10-CM

## 2023-02-10 DIAGNOSIS — Z923 Personal history of irradiation: Secondary | ICD-10-CM

## 2023-02-10 DIAGNOSIS — K219 Gastro-esophageal reflux disease without esophagitis: Secondary | ICD-10-CM

## 2023-02-10 DIAGNOSIS — R32 Unspecified urinary incontinence: Secondary | ICD-10-CM

## 2023-02-10 DIAGNOSIS — G4733 Obstructive sleep apnea (adult) (pediatric): Secondary | ICD-10-CM

## 2023-02-10 DIAGNOSIS — C73 Malignant neoplasm of thyroid gland: Secondary | ICD-10-CM

## 2023-02-10 DIAGNOSIS — E039 Hypothyroidism, unspecified: Secondary | ICD-10-CM

## 2023-02-10 DIAGNOSIS — M549 Dorsalgia, unspecified: Secondary | ICD-10-CM

## 2023-02-10 DIAGNOSIS — F419 Anxiety disorder, unspecified: Secondary | ICD-10-CM

## 2023-02-10 DIAGNOSIS — W19XXXA Unspecified fall, initial encounter: Secondary | ICD-10-CM

## 2023-02-10 DIAGNOSIS — E079 Disorder of thyroid, unspecified: Secondary | ICD-10-CM

## 2023-02-10 DIAGNOSIS — Z8249 Family history of ischemic heart disease and other diseases of the circulatory system: Secondary | ICD-10-CM

## 2023-02-10 DIAGNOSIS — S83209A Unspecified tear of unspecified meniscus, current injury, unspecified knee, initial encounter: Secondary | ICD-10-CM

## 2023-02-10 DIAGNOSIS — M5134 Other intervertebral disc degeneration, thoracic region: Secondary | ICD-10-CM

## 2023-02-10 DIAGNOSIS — R102 Pelvic and perineal pain: Secondary | ICD-10-CM

## 2023-02-10 DIAGNOSIS — M5136 Other intervertebral disc degeneration, lumbar region: Secondary | ICD-10-CM

## 2023-02-10 DIAGNOSIS — D539 Nutritional anemia, unspecified: Secondary | ICD-10-CM

## 2023-02-10 DIAGNOSIS — M5030 Other cervical disc degeneration, unspecified cervical region: Secondary | ICD-10-CM

## 2023-02-10 DIAGNOSIS — B999 Unspecified infectious disease: Secondary | ICD-10-CM

## 2023-02-10 DIAGNOSIS — R519 Generalized headaches: Secondary | ICD-10-CM

## 2023-02-10 DIAGNOSIS — Z9013 Acquired absence of bilateral breasts and nipples: Secondary | ICD-10-CM

## 2023-02-10 MED ORDER — TRAMADOL 25 MG PO TAB
25 mg | ORAL_TABLET | Freq: Every day | ORAL | 0 refills | Status: AC | PRN
Start: 2023-02-10 — End: ?

## 2023-02-10 NOTE — Progress Notes
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 time by using the pre check in and complete the questionnaires. This will make your next visit Check in faster.      Please don't hesitate to call if you have any problems or questions.   My nurse, Raquel, RN, can be reached at [phone].  If she does not answer, please leave a voicemail as she is probably rooming other patients. Please leave your name, the spelling of your last name, date of birth, phone number they can call you back and a description of why you are calling. You may also message [place] in MyChart.    Our fax number is [phone].     Medication refills:  Please use the MyChart Refill request or contact your pharmacy directly to request medication refills.  Please allow at least 3 business days for refill requests  .  Lab work:  The main lab is on the 1st floor of the [facility].  [address], [address] [place], [place] [address]  LAB HOURS  Mon 7 a.m. - 6 p.m.  Tues 7 a.m. - 6 p.m.  Wed 7 a.m. - 6 p.m.  Thur 7 a.m. - 6 p.m.  Fri 7 a.m. - 6 p.m.  Sat 7 a.m. - noon  Sun Closed -  It is walk in only, they do not take appointments:  Other lab locations are available; please see [email or web address]    Test results:  You will receive your test results at your appointment, by MyChart (our patient portal), or via phone or letter.   We prefer to use MyChart as much as possible.  If you are expecting results and have not heard from my office within 2 weeks of your testing, please send a MyChart message or call my office.    As a part of the CARES act, starting 08/23/2019, some results will be released to mychart automatically.  With these changes you may see your results before I do.  Critical lab results will be addressed immediately, but otherwise please  give me 72 hours to view and respond to your results before reaching out with questions.      Radiology is on the 2nd floor of the [address], among several other locations.  Please contact the radiology department to schedule at [phone].    Scheduling is available directly through MyChart, or via phone at [phone].  Same day and urgent care appointments are available.    We offer same day appointments for your acute health concerns. These appointments are on a first come, first  serve basis. Please call [phone] if you would like to make an appointment.   Appointment reminders may be received over the phone and by text.  Communication preferences can be managed in MyChart to ensure you receive important appointment notifications.    Support for many chronic illnesses is available through [employer]: [employer] or [phone].    We offer Integrated Behavioral Health services, nutrition support, and pharmacist support services free of charge.  To schedule an appointment with a specialist and/or testing please call the central scheduling number at [phone].     You may see my nurse practitioner, Knute Neu, for urgent needs or if I am unavailable.  We are working as a team to provide better continuity and access to our patients.      If you ever have emergency symptoms of chest pain, shortness of breath or uncontrolled or unexplained pain, please go to your closest emergency room.  You can give [place] an update after you have addressed any emergency.        For urgent issues after business hours/weekends/holidays call [phone] and request for the outpatient internal medicine physician to be paged.   Visit Disposition       Dispositions    Return in about 6 months (around 08/10/2023) for Follow-up [CODE 17492] - 40 mins.

## 2023-02-10 NOTE — Assessment & Plan Note
Persistent joint pain in hands, more than baseline. No other joint swelling noted.  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 Radial deviation of the second digit at the DIP joint.   Patient occasionally taking Tramadol for pain management.  -Refill Tramadol prescription (30 tablets).  -Had side effects affecting sleep with gabapentin and lyrica

## 2023-02-10 NOTE — Assessment & Plan Note
Sleep study at [facility] showed OSA  -Referral to pulmonology for further evaluation and potential CPAP setup.

## 2023-02-10 NOTE — Assessment & Plan Note
Patient experiencing pain, currently undergoing pelvic floor physical therapy.  -Continue with physical therapy sessions.

## 2023-02-10 NOTE — Assessment & Plan Note
Severe symptoms likely secondary to hiatal hernia. Patient is currently undergoing Bravo test and considering Transoral Incisionless Fundoplication (TIF) procedure with surgical hiatal hernia repair plan  -Continue current management and follow up with GI specialist for potential TIF procedure.  -Complete ordered barium swallow test.  -off Omeprazole 40mg  while the bravo test is being performed  -Follow with GI

## 2023-02-10 NOTE — Assessment & Plan Note
Follows with endocrinology  Recent TSH level elevated at 12, T4 within normal range. Patient taking Synthroid and omeprazole concurrently.  -Advise patient to take Synthroid one hour before breakfast, followed by omeprazole half an hour later.  -Currently on Levothyroxine 100mg  daily except for 50 mg once a week.

## 2023-02-11 ENCOUNTER — Encounter: Admit: 2023-02-11 | Discharge: 2023-02-11 | Payer: MEDICARE

## 2023-02-11 NOTE — Telephone Encounter
Patient VM stating Pulmonology is booked 6 months out that is too long to wait for a cpap. And they informed me that Dr. Jacqulynn Cadet should be able to order it.

## 2023-02-14 ENCOUNTER — Encounter: Admit: 2023-02-14 | Discharge: 2023-02-14 | Payer: MEDICARE

## 2023-02-14 MED ORDER — MISCELLANEOUS MEDICAL SUPPLY MISC MISC
0 refills | 1.00000 days | Status: AC
Start: 2023-02-14 — End: ?

## 2023-02-15 ENCOUNTER — Encounter: Admit: 2023-02-15 | Discharge: 2023-02-15 | Payer: MEDICARE

## 2023-02-15 ENCOUNTER — Ambulatory Visit: Admit: 2023-02-15 | Discharge: 2023-02-15 | Payer: MEDICARE

## 2023-02-15 DIAGNOSIS — K449 Diaphragmatic hernia without obstruction or gangrene: Secondary | ICD-10-CM

## 2023-02-15 MED ORDER — BARIUM SULFATE 98 % PO SUSR
130 mL | Freq: Once | ORAL | 0 refills | Status: CP
Start: 2023-02-15 — End: ?
  Administered 2023-02-15: 20:00:00 90 mL via ORAL

## 2023-02-15 MED ORDER — BARIUM SULFATE 40 % (W/V) PO SUSP
60 mL | Freq: Once | ORAL | 0 refills | Status: CP
Start: 2023-02-15 — End: ?
  Administered 2023-02-15: 20:00:00 60 mL via ORAL

## 2023-02-15 MED ORDER — SOD BICARB-CITRIC AC-SIMETH 2.21-1.53 GRAM/4 GRAM PO GREP
1 | Freq: Once | ORAL | 0 refills | Status: CP
Start: 2023-02-15 — End: ?

## 2023-02-15 MED ORDER — BARIUM SULFATE 700 MG PO TAB
700 mg | Freq: Once | ORAL | 0 refills | Status: CP
Start: 2023-02-15 — End: ?
  Administered 2023-02-15: 20:00:00 700 mg via ORAL

## 2023-02-16 ENCOUNTER — Ambulatory Visit: Admit: 2023-02-16 | Discharge: 2023-02-16 | Payer: MEDICARE

## 2023-02-16 ENCOUNTER — Encounter: Admit: 2023-02-16 | Discharge: 2023-02-16 | Payer: MEDICARE

## 2023-02-16 DIAGNOSIS — F3289 Other specified depressive episodes: Secondary | ICD-10-CM

## 2023-02-16 DIAGNOSIS — D539 Nutritional anemia, unspecified: Secondary | ICD-10-CM

## 2023-02-16 DIAGNOSIS — M5136 Other intervertebral disc degeneration, lumbar region: Secondary | ICD-10-CM

## 2023-02-16 DIAGNOSIS — E890 Postprocedural hypothyroidism: Secondary | ICD-10-CM

## 2023-02-16 DIAGNOSIS — F32A Depression: Secondary | ICD-10-CM

## 2023-02-16 DIAGNOSIS — F419 Anxiety disorder, unspecified: Secondary | ICD-10-CM

## 2023-02-16 DIAGNOSIS — M4800 Spinal stenosis, site unspecified: Secondary | ICD-10-CM

## 2023-02-16 DIAGNOSIS — E039 Hypothyroidism, unspecified: Secondary | ICD-10-CM

## 2023-02-16 DIAGNOSIS — Z8249 Family history of ischemic heart disease and other diseases of the circulatory system: Secondary | ICD-10-CM

## 2023-02-16 DIAGNOSIS — M5030 Other cervical disc degeneration, unspecified cervical region: Secondary | ICD-10-CM

## 2023-02-16 DIAGNOSIS — B999 Unspecified infectious disease: Secondary | ICD-10-CM

## 2023-02-16 DIAGNOSIS — Z9013 Acquired absence of bilateral breasts and nipples: Secondary | ICD-10-CM

## 2023-02-16 DIAGNOSIS — Z789 Other specified health status: Secondary | ICD-10-CM

## 2023-02-16 DIAGNOSIS — IMO0002 Ulcer: Secondary | ICD-10-CM

## 2023-02-16 DIAGNOSIS — E7800 Pure hypercholesterolemia, unspecified: Secondary | ICD-10-CM

## 2023-02-16 DIAGNOSIS — S83209A Unspecified tear of unspecified meniscus, current injury, unspecified knee, initial encounter: Secondary | ICD-10-CM

## 2023-02-16 DIAGNOSIS — C50919 Malignant neoplasm of unspecified site of unspecified female breast: Secondary | ICD-10-CM

## 2023-02-16 DIAGNOSIS — W19XXXA Unspecified fall, initial encounter: Secondary | ICD-10-CM

## 2023-02-16 DIAGNOSIS — R011 Cardiac murmur, unspecified: Secondary | ICD-10-CM

## 2023-02-16 DIAGNOSIS — M810 Age-related osteoporosis without current pathological fracture: Secondary | ICD-10-CM

## 2023-02-16 DIAGNOSIS — C73 Malignant neoplasm of thyroid gland: Secondary | ICD-10-CM

## 2023-02-16 DIAGNOSIS — R519 Generalized headaches: Secondary | ICD-10-CM

## 2023-02-16 DIAGNOSIS — K219 Gastro-esophageal reflux disease without esophagitis: Secondary | ICD-10-CM

## 2023-02-16 DIAGNOSIS — R32 Unspecified urinary incontinence: Secondary | ICD-10-CM

## 2023-02-16 DIAGNOSIS — M549 Dorsalgia, unspecified: Secondary | ICD-10-CM

## 2023-02-16 DIAGNOSIS — M5134 Other intervertebral disc degeneration, thoracic region: Secondary | ICD-10-CM

## 2023-02-16 DIAGNOSIS — Z923 Personal history of irradiation: Secondary | ICD-10-CM

## 2023-02-16 DIAGNOSIS — I10 Essential (primary) hypertension: Secondary | ICD-10-CM

## 2023-02-16 DIAGNOSIS — Z85850 Personal history of malignant neoplasm of thyroid: Secondary | ICD-10-CM

## 2023-02-16 DIAGNOSIS — E785 Hyperlipidemia, unspecified: Secondary | ICD-10-CM

## 2023-02-16 DIAGNOSIS — C801 Malignant (primary) neoplasm, unspecified: Secondary | ICD-10-CM

## 2023-02-16 DIAGNOSIS — M2550 Pain in unspecified joint: Secondary | ICD-10-CM

## 2023-02-16 DIAGNOSIS — E079 Disorder of thyroid, unspecified: Secondary | ICD-10-CM

## 2023-02-21 ENCOUNTER — Encounter: Admit: 2023-02-21 | Discharge: 2023-02-21 | Payer: MEDICARE

## 2023-02-21 NOTE — Progress Notes
96 hrs Bravo pH study off of PPI    SUMMARY:  Abnormal pH study off of PPI, especially patient had significant acid reflux on day 2, 3 and 4. Longest duration of reflux was 24.7 minutes in supine. Strong symptom association probability (SAP) correlation between heartburn and chest pain with reflux episodes but not with regurgitation.     Recommendation:   Suggest daily PPI or other means of reflux disease management.    Jolee Ewing, MD

## 2023-02-22 ENCOUNTER — Encounter: Admit: 2023-02-22 | Discharge: 2023-02-22 | Payer: MEDICARE

## 2023-02-22 ENCOUNTER — Ambulatory Visit: Admit: 2023-02-22 | Discharge: 2023-02-23 | Payer: MEDICARE

## 2023-02-22 DIAGNOSIS — K449 Diaphragmatic hernia without obstruction or gangrene: Secondary | ICD-10-CM

## 2023-02-22 DIAGNOSIS — R32 Unspecified urinary incontinence: Secondary | ICD-10-CM

## 2023-02-22 DIAGNOSIS — M5030 Other cervical disc degeneration, unspecified cervical region: Secondary | ICD-10-CM

## 2023-02-22 DIAGNOSIS — Z85850 Personal history of malignant neoplasm of thyroid: Secondary | ICD-10-CM

## 2023-02-22 DIAGNOSIS — M810 Age-related osteoporosis without current pathological fracture: Secondary | ICD-10-CM

## 2023-02-22 DIAGNOSIS — E785 Hyperlipidemia, unspecified: Secondary | ICD-10-CM

## 2023-02-22 DIAGNOSIS — Z789 Other specified health status: Secondary | ICD-10-CM

## 2023-02-22 DIAGNOSIS — C73 Malignant neoplasm of thyroid gland: Secondary | ICD-10-CM

## 2023-02-22 DIAGNOSIS — E7800 Pure hypercholesterolemia, unspecified: Secondary | ICD-10-CM

## 2023-02-22 DIAGNOSIS — R519 Generalized headaches: Secondary | ICD-10-CM

## 2023-02-22 DIAGNOSIS — Z923 Personal history of irradiation: Secondary | ICD-10-CM

## 2023-02-22 DIAGNOSIS — B999 Unspecified infectious disease: Secondary | ICD-10-CM

## 2023-02-22 DIAGNOSIS — M2550 Pain in unspecified joint: Secondary | ICD-10-CM

## 2023-02-22 DIAGNOSIS — Z9013 Acquired absence of bilateral breasts and nipples: Secondary | ICD-10-CM

## 2023-02-22 DIAGNOSIS — M51369 Degenerative disc disease, lumbar: Secondary | ICD-10-CM

## 2023-02-22 DIAGNOSIS — C50919 Malignant neoplasm of unspecified site of unspecified female breast: Secondary | ICD-10-CM

## 2023-02-22 DIAGNOSIS — R011 Cardiac murmur, unspecified: Secondary | ICD-10-CM

## 2023-02-22 DIAGNOSIS — F419 Anxiety disorder, unspecified: Secondary | ICD-10-CM

## 2023-02-22 DIAGNOSIS — K219 Gastro-esophageal reflux disease without esophagitis: Secondary | ICD-10-CM

## 2023-02-22 DIAGNOSIS — S83209A Unspecified tear of unspecified meniscus, current injury, unspecified knee, initial encounter: Secondary | ICD-10-CM

## 2023-02-22 DIAGNOSIS — E039 Hypothyroidism, unspecified: Secondary | ICD-10-CM

## 2023-02-22 DIAGNOSIS — IMO0002 Ulcer: Secondary | ICD-10-CM

## 2023-02-22 DIAGNOSIS — M5134 Other intervertebral disc degeneration, thoracic region: Secondary | ICD-10-CM

## 2023-02-22 DIAGNOSIS — F32A Depression: Secondary | ICD-10-CM

## 2023-02-22 DIAGNOSIS — E079 Disorder of thyroid, unspecified: Secondary | ICD-10-CM

## 2023-02-22 DIAGNOSIS — D539 Nutritional anemia, unspecified: Secondary | ICD-10-CM

## 2023-02-22 DIAGNOSIS — W19XXXA Unspecified fall, initial encounter: Secondary | ICD-10-CM

## 2023-02-22 DIAGNOSIS — E890 Postprocedural hypothyroidism: Secondary | ICD-10-CM

## 2023-02-22 DIAGNOSIS — I10 Essential (primary) hypertension: Secondary | ICD-10-CM

## 2023-02-22 DIAGNOSIS — M549 Dorsalgia, unspecified: Secondary | ICD-10-CM

## 2023-02-22 DIAGNOSIS — Z8249 Family history of ischemic heart disease and other diseases of the circulatory system: Secondary | ICD-10-CM

## 2023-02-22 DIAGNOSIS — M4800 Spinal stenosis, site unspecified: Secondary | ICD-10-CM

## 2023-02-22 DIAGNOSIS — F3289 Other specified depressive episodes: Secondary | ICD-10-CM

## 2023-02-22 DIAGNOSIS — C801 Malignant (primary) neoplasm, unspecified: Secondary | ICD-10-CM

## 2023-02-22 NOTE — Patient Instructions
? For any questions regarding these requirements please call Josefine Fuhr our Care Coordinator at [phone] or utilize MyChart.         HIATAL HERNIA/ TRANSORAL INCISIONLESS FUNOPLICATION (cTIF)  POSTOP DIET?          Laparoscopic Hiatal Hernia/Transoral Incisionless Fundoplication is a surgery for patients suffering from gastroesophageal reflux disease, also known as GERD. After this procedure is performed, it is necessary to follow this post-op diet carefully.? Following this diet can minimize pain and maximize the change of the surgery?s success.?       WEEK 1-2     FULL LIQUIDS: This includes all clear liquids plus adding ?cloudy? liquids or milky liquids. Some examples of full liquids are pudding, cream soups, protein shakes, cream of wheat and yogurts.     NOT ADVISED: Caffeinated beverages, carbonated drinks, thick liquids, solids, gum, alcohol and hard candy         WEEK 3     You may add pureed and mashed foods to your diet. Foods that you can puree in a blender include fruit without pulp or seeds, pasta, mashed potatoes, vegetables, meat, and oatmeal. Ice cream, baby food, milk shakes and instant breakfast drinks are also acceptable diet choices.         WEEK 4     Increase to a soft food diet. Some recommended soft foods are meat with gravy, soft and cooked scrambled eggs, hot cereals, cold cereals softened in milk, soft cheeses, sushi and soft cup up pasta.     AVOID: Raw vegetables, steak, chips, tacos, bread, sandwiches, and hamburgers.         WEEK 5     Increase diet to a modified regular diet.     AVOID: Beef, chicken, bread, rice, and tortillas.         WEEK 6     Increase diet as tolerated but be sure to take small bites and chew well.          While on this diet it is best to eat 4-6 meals a day. Eating slowly and exercising lightly aid in digestion and ease discomfort. Another good rule of thumb is to stay in an upright position while eating or drinking and remain that way 20 minutes after you are done. If pain or discomfort persists, contact your physician.     Instructions for patients:    The TIF procedure will be done under general anesthesia with the plan to stay overnight for observation. Antibiotics will be given during the procedure and afterward. Pain and other symptoms such as nausea, bloating and pain will be managed while in the hospital and full instructions will be provided.      After the procedure:     -Nothing by mouth (NPO)  -You will receive intravenous fluids  -You will have a radiology test called esophagram the next day. If no leak or other issues, then you will be started on clear liquid diet.     Up on discharge:    -Omeprazole 40 mg twice daily for 2 weeks, then wean to 40 mg once daily for 1 week, then to 20 mg once daily for 1 week, then 20 mg every other day for 1 week, then stop     Diet:    -Full liquids for 2 weeks  -Pureed foods for 1 week  -Soft foods for 1 week  -A modified regular diet (avoid beef, chicken, bread, rice, and tortillas) for a week  -A regular   diet is allowed in the 6th week     Activity:    Lifting can lead to hiatal hernia recurrence and therefore you should avoid activities that would increase intraabdominal pressure in the postoperative period.    Week 1: short distance walking, minimal physical activity, no lifting more than 5 pounds  Week 2: slow climbing stairs, no intense exercise, no lifting more than 5 pounds, sex allowed  Week 3-6: no intense exercise, may lift up to 25 pounds  Week 7: Resume normal activity

## 2023-02-22 NOTE — Progress Notes
Department of Metabolic, Bariatric, and Minimally Invasive Surgery      02/22/2023    Reason for Consult:  Hiatal hernia with associated GERD and esophagitis    Assessment:  62 y.o. female with hiatal hernia and associated GERD and esophagitis.      Plan   I discussed proceeding with laparoscopic hiatal hernia repair in combination with c-TIF procedure performed by Dr. Burnis Medin.      We discussed the preoperative evaluation, operative procedure, hospital stay, and postoperative diet.    The risk of surgery including but not limited to bleeding, infection, trocar related injury, possibility of slippage of fundoplication or recurrence of hiatal hernia, damage to adjacent structures, and possible need for future procedure all discussed.  We also discussed potential perioperative complications of DVT, PE, heart attack, stroke, and even death.      The patient had an opportunity to ask all their questions to their apparent satisfaction.  She is happy with these arrangements and wishes to proceed.            _____________________________________________________________________    History of Present Illness: Cindy Randolph is a 61 y.o. female who is referred by Avie Echevaria, MD  for evaluation of potential surgery options for hiatal hernia and assoicated GERD/esophagitis.    Patient reports 6 year history of heartburn which worsened after completing chemotherapy and radiation (breast cancer).  She then had a hard fall down some stairs and then experienced esophageal spasms.  She had an EGD shortly thereafter and was noted to have hiatal hernia.  Patient has been on a PPI which controls her symptoms, but she is not able to taper off due to recurrence of symptoms.    02/08/23:  EGD - 2cm hiatal hernia, LA grade B esophagitis, started on PPI  HRM - normal motility  pH study (96 hour) - strong symptom correlation with heartburn and reflux events.  Significant acid reflux    02/15/23 - esophagram showed small hiatal hernia, no reflux or dysmotility      Past Medical History:   Diagnosis Date    Accidental fall 02/23/22    Knee gave out and fell down stairs    Acquired hypothyroidism     Anxiety 2016    from pain    Back pain     Breast cancer in female 12/2020    Left    Cancer of thyroid (HCC) 1980    Degenerative disc disease, cervical 1994    Degenerative disc disease, lumbar 2010    Degenerative disc disease, thoracic 2010    Depression situational, transient    after divorce, after death of 2nd husband    Depressive disorder, not elsewhere classified 7/23    Now on Lexapro; it is effective    Essential hypertension began in 3rd trimester most pregnancies    remained after last pregnancy    Essential hypertension, benign as above    Family history of coronary artery disease in brother 06/17/2022    04/01/2022- Per OV Dr. Maisie Fus     Generalized headaches 1996-2010    probably hormonally related    GERD (gastroesophageal reflux disease)     H/O total thyroidectomy 05/24/1978    Heart murmur at birth    Dr. Maisie Fus said he didn't hear it a few years ago    High cholesterol     History of bilateral mastectomy 06/22/2022    History of external beam radiation therapy 07/15/2021    History of thyroid cancer 06/22/2022  TSH 0.39 and pt not taking med regularly until about 2 weeks ago.  Will have her repeat TSH in 3 weeks and f/u after.  Needs to be between 0.1 and 0.3.  Will refer her back to Endo for f/u.    Incontinence 3/23    With coughing and sneezing and strong sudden mivemwnts    Infection     Joint pain 1994    Limb alert care status     No access on R-arm    Osteoporosis 03/2021    Bilat hips    Other and unspecified hyperlipidemia 11/23    Will see cardiologist early 2024    Other malignant neoplasm without specification of site thyroid, 1980    thyroidectomy    Spinal stenosis 2017 to present    per MRIs    Thyroid disorder as noted    Torn meniscus 12/25/2020    Ulcer 1987    with divorce; resolved    Unspecified deficiency anemia 2009    from excessive bleeding post-partum; treated iron         Surgical History:   Procedure Laterality Date    HX WRIST FRACTURE SURGERY  1993    Baker's thumb with fixation    ARTHROPLASTY  2001    L ACL    COLONOSCOPY  2017    normal    ARTHROSCOPY KNEE WITH PARTIAL LATERAL MENISCECTOMY AND LEFT KNEE INJECTION. Right 01/08/2021    Performed by Tanja Port, MD at [facility] OR    BILATERAL TOTAL MASTECTOMIES Bilateral 01/22/2021    Performed by Massie Kluver, MD at [facility] OR    INTRAOPERATIVE SENTINEL LYMPH NODE IDENTIFICATION WITH/ WITHOUT NON-RADIOACTIVE DYE INJECTION Left 01/22/2021    Performed by Massie Kluver, MD at [facility] OR    INJECTION RADIOACTIVE TRACER FOR SENTINEL NODE IDENTIFICATION Left 01/22/2021    Performed by Massie Kluver, MD at [facility] OR    LEFT AXILLARY SENTINEL LYMPH NODE BIOPSY Left 01/22/2021    Performed by Massie Kluver, MD at [facility]    BILATERAL CHEST FLAT CLOSURE Bilateral 01/22/2021    Performed by Stevenson Clinch, MD at [facility] OR    BILATERAL CHEST FLAT CLOSURE x 8 Bilateral 01/22/2021    Performed by Stevenson Clinch, MD at [facility] OR    EXCISION BENIGN LESION 0.5 CM OR LESS - TORSO Right 01/22/2021    Performed by Stevenson Clinch, MD at [facility] OR    TUNNELED VENOUS PORT PLACEMENT Right 02/19/2021    Placement of port-a-cath - 49F Right 02/19/2021    Performed by Freund, Alecia Lemming., MD at [facility] OR    FLUOROSCOPIC GUIDANCE CENTRAL VENOUS ACCESS DEVICE PLACEMENT/ REPLACEMENT/ REMOVAL N/A 02/19/2021    Performed by Carloyn Manner., MD at [facility] OR    Left Completion Axillary Lymph Node Dissection Left 05/14/2021    Performed by Massie Kluver, MD at [facility] OR    REMOVAL TUNNELED CENTRAL VENOUS ACCESS DEVICE INCLUDING PORT/ PUMP Right 05/14/2021    Performed by Massie Kluver, MD at [facility] OR    COLONOSCOPY DIAGNOSTIC WITH SPECIMEN COLLECTION BY BRUSHING/ WASHING - FLEXIBLE N/A 10/14/2021    Performed by Benetta Spar, MD at [facility] OR ESOPHAGOGASTRODUODENOSCOPY WITH SPECIMEN COLLECTION BY BRUSHING/ WASHING N/A 06/17/2022    Performed by Remigio Eisenmenger, MD at [facility] ENDO    ESOPHAGOGASTRODUODENOSCOPY WITH DILATION ESOPHAGUS WITH BALLOON 30 MM OR GREATER - FLEXIBLE N/A 06/17/2022  Performed by Remigio Eisenmenger, MD at [facility] ENDO    ANGIOGRAPHY CORONARY ARTERY WITH LEFT HEART CATHETERIZATION N/A 07/16/2022    Performed by Harley Alto, MD at [facility] CATH LAB    PERCUTANEOUS CORONARY STENT PLACEMENT WITH ANGIOPLASTY N/A 07/16/2022    Performed by Harley Alto, MD at [facility] CATH LAB    ESOPHAGEAL MOTILITY STUDY N/A 02/08/2023    Performed by Jolee Ewing, MD at [facility] ENDO    ESOPHAGOGASTRODUODENOSCOPY WITH BIOPSY - FLEXIBLE N/A 02/08/2023    Performed by Jolee Ewing, MD at [facility] ENDO    GASTROESOPHAGEAL REFLUX TEST WITH MUCOSAL ATTACHED TELEMETRY PH ELECTE N/A 02/08/2023    Performed by Jolee Ewing, MD at [facility] ENDO    ESOPHAGOGASTRODUODENOSCOPY WITH DILATION GASTRIC/ DUODENAL STRICTURE - FLEXIBLE N/A 02/08/2023    Performed by Jolee Ewing, MD at [facility] ENDO    ACL RECONSTRUCTION  06/03/1999    BREAST SURGERY  01/24/21    Bilat mastectomy    ECHOCARDIOGRAM PROCEDURE      FRACTURE SURGERY  1993    ORIF L 1st metacarpal    HX CARPAL TUNNEL RELEASE  1996    With second and other oregbnancies    KNEE SURGERY  L ACL as above    LYMPH NODE DISSECTION  12/22    Left    THYROIDECTOMY       Social History     Tobacco Use    Smoking status: Never     Passive exposure: Past    Smokeless tobacco: Never    Tobacco comments:     Significant second hand smoke from parents 61-65 years old   Vaping Use    Vaping status: Never Used   Substance Use Topics    Alcohol use: Not Currently     Comment: Prior to 2020, socially, one drink once a week at most    Drug use: Never     Comment: in late teens, early twenties, a few times and not since     Family History   Problem Relation Name Age of Onset    Arthritis Paternal Grandmother Ted Mcalpine         multiple heberdens nodules and deformities    Back pain Paternal Grandmother Ted Mcalpine     Arthritis-osteo Paternal Grandmother Ted Mcalpine     Diabetes Paternal Grandmother Ted Mcalpine         Type 2 as older adult    Arthritis Mother Carmina Miller         wear and tear    Back pain Mother Carmina Miller     Hypertension Mother Carmina Miller     Joint Pain Mother Carmina Miller     Neck Pain Mother Carmina Miller     Cancer-Breast Mother Carmina Miller 64        was on HRT for 10-15 years    Cancer Mother Carmina Miller         Breast CA, post menopausal, estrogen-sensitive    Miscarriages Mother Carmina Miller         3-5 and a stillbirth    Basal Cell Carcinoma Brother Onalee Hua (Brother)     Back pain Brother Onalee Hua (Brother)         Has had multiple laminectomy surgeries    Hypertension Brother Onalee Hua (Brother)     Asthma Brother Onalee Hua (Brother)         Worst when a child    Basal Cell Carcinoma Brother Chip     Back pain Brother  Chip         Arthritis    Hypertension Brother Chip     Basal Cell Carcinoma Brother Isaias Cowman     Back pain Brother Isaias Cowman         Arthritis    Hypertension Brother Isaias Cowman     Heart problem Brother Isaias Cowman         Stent in LAD, positive apo(a)    Diabetes Father Alinda Dooms         Type 2    Heart problem Father Alinda Dooms         CABG 3    Heart Disease Father Alinda Dooms     Hypertension Father Alinda Dooms     Alcohol abuse Father Alinda Dooms     Diabetes Maternal Grandfather Elam Dutch         Type 2 as older adult    Birth Defect Daughter Rhoderick Moody         PRS, congenital diaphragmatic hernia, malrotation, grey matter heterotopia    Stroke Maternal Uncle Jillyn Hidden         In his 69s    Thyroid Disease Maternal Uncle Jillyn Hidden     Diabetes Paternal Grandfather Grandpa     Birth Defect Nephew          craniosynostosis    Thyroid Disease Maternal Uncle Bill Breiner     Cancer-Breast Maternal Great-Aunt  70       Allergies:    Allergies   Allergen Reactions    Mango ANAPHYLAXIS    Other [Unclassified Drug] ANAPHYLAXIS DUCK Meat and Eggs     Imdur [Isosorbide Mononitrate] CHEST TIGHTNESS    Iodinated Contrast Media RASH     She developed rash on back and neck after hospitalization 06/2022 where she underwent heart cath. Unsure if contrast was cause of rash, but it developed shortly after procedure.    Oxycodone NAUSEA ONLY     Prefers tramadol    Sudafed [Pseudoephedrine Hcl] PALPITATIONS       Current Outpatient Medications on File Prior to Visit   Medication Sig Dispense Refill    acetaminophen (TYLENOL) 325 mg tablet Take two tablets by mouth every 6 hours. Take scheduled for 3 days after surgery, then as needed. Do not exceed 4,000mg  in a 24 hour period. 40 tablet 0    amLODIPine (NORVASC) 5 mg tablet Take one tablet by mouth daily. 90 tablet 3    aspirin 81 mg chewable tablet Chew one tablet by mouth daily. 90 tablet 0    biotin 1 mg cap Take one capsule by mouth daily.      CALCIUM PO Take 600 mg by mouth daily.      CHOLEcalciferoL (vitamin D3) 1,000 units tablet Take one tablet by mouth daily. Takes 5000 units      clobetasoL (TEMOVATE) 0.05 % topical ointment Apply  topically to affected area twice daily. Apply to areas of rash on hands twice daily; do NOT use on face/groin/underarms, use up to 2 weeks per month 60 g 3    diclofenac sodium (VOLTAREN) 1 % topical gel Apply four g topically to affected area daily as needed.      ezetimibe (ZETIA) 10 mg tablet Take one tablet by mouth daily. 90 tablet 3    Fish,Bora,Flax Oils-OM3,6,9 #1 (TRIPLE OMEGA) 400-400-400 mg capsule Take one capsule by mouth daily.      metoprolol succinate XL (TOPROL XL) 25 mg extended release tablet Take one tablet by  mouth at bedtime daily. 90 tablet 3    Miscellaneous Medical Supply misc DME requirements, rx to include the following typed information:    NPI: [id number]  Doctor's typed name: Assunta Curtis, MD  ICD 10: G47.33  Length of need: 99 months  Start date:02/14/2023  Signature date: 02/14/2023  Detailed equipment: CPAP machine with any accessories. 1 each 0    omeprazole DR (PRILOSEC) 40 mg capsule Take one capsule by mouth daily. 90 capsule 1    other medication K Complete K1 & K2 as MK-4 & MK-7 : Take 1 softgel by mouth once daily      rosuvastatin (CRESTOR) 10 mg tablet Take one tablet by mouth daily. 90 tablet 3    Selenium 100 mcg tab Take one tablet by mouth daily.      SYNTHROID 100 mcg tablet Take one tablet by mouth daily 30 minutes before breakfast. 90 tablet 3    traMADoL 25 mg tablet Take one tablet by mouth daily as needed. 30 tablet 0    vitamins, B complex tab Take one tablet by mouth daily.      vitamins, multiple cap Take one capsule by mouth daily.      zinc sulfate 220 mg (50 mg elemental zinc) capsule Take one capsule by mouth daily.       No current facility-administered medications on file prior to visit.        Review of Systems:  Review of Systems   Cardiovascular:  Positive for chest pain.   Gastrointestinal:  Positive for abdominal pain and nausea.        Reflux   Musculoskeletal:  Positive for back pain.   All other systems reviewed and are negative.      Vitals:    02/22/23 0941   BP: 128/84   Pulse: 67   Temp: 36.7 ?C (98.1 ?F)   SpO2: 97%   PainSc: Five   Weight: 71.9 kg (158 lb 9.6 oz)   Height: 157.5 cm (5' 2)     Body mass index is 29.01 kg/m?Marland Kitchen    Physical Exam:  GENERAL:  Alert and oriented x 3, not in acute distress.  HEAD:  Normocephalic, atraumatic.  EYES:  EOM intact, no conjunctivitis or jaundice.  NECK:  Supple, no thyromegaly or bruit.  SKIN:  No rashes or ulcers appreciated  LUNGS:  Symmetric expansion, clear to auscultation bilaterally, no crackles or wheezing.  HEART:  Regular rate and rhythm  ABDOMEN:  Obese, soft, non-tender, non-distended  EXTREMITIES:  No cyanosis, clubbing or edema.  NEURO:  Moves all 4 extremities without difficulty, gait is appropriate.  PSYCH:  Appropriate mood and behavior.     Lab/Radiology/Other Diagnostic Tests:  No results found for this or any previous visit.    No results found for this or any previous visit.

## 2023-02-23 ENCOUNTER — Encounter: Admit: 2023-02-23 | Discharge: 2023-02-23 | Payer: MEDICARE

## 2023-02-23 ENCOUNTER — Ambulatory Visit: Admit: 2023-02-23 | Discharge: 2023-02-24 | Payer: MEDICARE

## 2023-02-23 DIAGNOSIS — C801 Malignant (primary) neoplasm, unspecified: Secondary | ICD-10-CM

## 2023-02-23 DIAGNOSIS — Z8249 Family history of ischemic heart disease and other diseases of the circulatory system: Secondary | ICD-10-CM

## 2023-02-23 DIAGNOSIS — R519 Generalized headaches: Secondary | ICD-10-CM

## 2023-02-23 DIAGNOSIS — M51369 Degenerative disc disease, lumbar: Secondary | ICD-10-CM

## 2023-02-23 DIAGNOSIS — R32 Unspecified urinary incontinence: Secondary | ICD-10-CM

## 2023-02-23 DIAGNOSIS — L821 Other seborrheic keratosis: Secondary | ICD-10-CM

## 2023-02-23 DIAGNOSIS — M810 Age-related osteoporosis without current pathological fracture: Secondary | ICD-10-CM

## 2023-02-23 DIAGNOSIS — E785 Hyperlipidemia, unspecified: Secondary | ICD-10-CM

## 2023-02-23 DIAGNOSIS — M2550 Pain in unspecified joint: Secondary | ICD-10-CM

## 2023-02-23 DIAGNOSIS — W19XXXA Unspecified fall, initial encounter: Secondary | ICD-10-CM

## 2023-02-23 DIAGNOSIS — R011 Cardiac murmur, unspecified: Secondary | ICD-10-CM

## 2023-02-23 DIAGNOSIS — M549 Dorsalgia, unspecified: Secondary | ICD-10-CM

## 2023-02-23 DIAGNOSIS — L739 Follicular disorder, unspecified: Secondary | ICD-10-CM

## 2023-02-23 DIAGNOSIS — I10 Essential (primary) hypertension: Secondary | ICD-10-CM

## 2023-02-23 DIAGNOSIS — M5134 Other intervertebral disc degeneration, thoracic region: Secondary | ICD-10-CM

## 2023-02-23 DIAGNOSIS — D539 Nutritional anemia, unspecified: Secondary | ICD-10-CM

## 2023-02-23 DIAGNOSIS — F32A Depression: Secondary | ICD-10-CM

## 2023-02-23 DIAGNOSIS — M5030 Other cervical disc degeneration, unspecified cervical region: Secondary | ICD-10-CM

## 2023-02-23 DIAGNOSIS — S83209A Unspecified tear of unspecified meniscus, current injury, unspecified knee, initial encounter: Secondary | ICD-10-CM

## 2023-02-23 DIAGNOSIS — D229 Melanocytic nevi, unspecified: Secondary | ICD-10-CM

## 2023-02-23 DIAGNOSIS — L570 Actinic keratosis: Secondary | ICD-10-CM

## 2023-02-23 DIAGNOSIS — L814 Other melanin hyperpigmentation: Secondary | ICD-10-CM

## 2023-02-23 DIAGNOSIS — Z9013 Acquired absence of bilateral breasts and nipples: Secondary | ICD-10-CM

## 2023-02-23 DIAGNOSIS — K219 Gastro-esophageal reflux disease without esophagitis: Secondary | ICD-10-CM

## 2023-02-23 DIAGNOSIS — C50919 Malignant neoplasm of unspecified site of unspecified female breast: Secondary | ICD-10-CM

## 2023-02-23 DIAGNOSIS — IMO0002 Ulcer: Secondary | ICD-10-CM

## 2023-02-23 DIAGNOSIS — B999 Unspecified infectious disease: Secondary | ICD-10-CM

## 2023-02-23 DIAGNOSIS — Z923 Personal history of irradiation: Secondary | ICD-10-CM

## 2023-02-23 DIAGNOSIS — Z789 Other specified health status: Secondary | ICD-10-CM

## 2023-02-23 DIAGNOSIS — E039 Hypothyroidism, unspecified: Secondary | ICD-10-CM

## 2023-02-23 DIAGNOSIS — E7800 Pure hypercholesterolemia, unspecified: Secondary | ICD-10-CM

## 2023-02-23 DIAGNOSIS — E079 Disorder of thyroid, unspecified: Secondary | ICD-10-CM

## 2023-02-23 DIAGNOSIS — M4800 Spinal stenosis, site unspecified: Secondary | ICD-10-CM

## 2023-02-23 DIAGNOSIS — C73 Malignant neoplasm of thyroid gland: Secondary | ICD-10-CM

## 2023-02-23 DIAGNOSIS — F419 Anxiety disorder, unspecified: Secondary | ICD-10-CM

## 2023-02-23 DIAGNOSIS — E890 Postprocedural hypothyroidism: Secondary | ICD-10-CM

## 2023-02-23 DIAGNOSIS — L84 Corns and callosities: Secondary | ICD-10-CM

## 2023-02-23 DIAGNOSIS — F3289 Other specified depressive episodes: Secondary | ICD-10-CM

## 2023-02-23 DIAGNOSIS — L578 Other skin changes due to chronic exposure to nonionizing radiation: Secondary | ICD-10-CM

## 2023-02-23 DIAGNOSIS — Z85850 Personal history of malignant neoplasm of thyroid: Secondary | ICD-10-CM

## 2023-02-28 ENCOUNTER — Encounter: Admit: 2023-02-28 | Discharge: 2023-02-28 | Payer: MEDICARE

## 2023-02-28 ENCOUNTER — Ambulatory Visit: Admit: 2023-02-28 | Discharge: 2023-03-01 | Payer: MEDICARE

## 2023-02-28 DIAGNOSIS — Z789 Other specified health status: Secondary | ICD-10-CM

## 2023-02-28 DIAGNOSIS — E079 Disorder of thyroid, unspecified: Secondary | ICD-10-CM

## 2023-02-28 DIAGNOSIS — K219 Gastro-esophageal reflux disease without esophagitis: Secondary | ICD-10-CM

## 2023-02-28 DIAGNOSIS — F419 Anxiety disorder, unspecified: Secondary | ICD-10-CM

## 2023-02-28 DIAGNOSIS — Z923 Personal history of irradiation: Secondary | ICD-10-CM

## 2023-02-28 DIAGNOSIS — C73 Malignant neoplasm of thyroid gland: Secondary | ICD-10-CM

## 2023-02-28 DIAGNOSIS — R32 Unspecified urinary incontinence: Secondary | ICD-10-CM

## 2023-02-28 DIAGNOSIS — W19XXXA Unspecified fall, initial encounter: Secondary | ICD-10-CM

## 2023-02-28 DIAGNOSIS — M549 Dorsalgia, unspecified: Secondary | ICD-10-CM

## 2023-02-28 DIAGNOSIS — Z85850 Personal history of malignant neoplasm of thyroid: Secondary | ICD-10-CM

## 2023-02-28 DIAGNOSIS — R1110 Vomiting, unspecified: Secondary | ICD-10-CM

## 2023-02-28 DIAGNOSIS — E890 Postprocedural hypothyroidism: Secondary | ICD-10-CM

## 2023-02-28 DIAGNOSIS — B999 Unspecified infectious disease: Secondary | ICD-10-CM

## 2023-02-28 DIAGNOSIS — M51369 Degenerative disc disease, lumbar: Secondary | ICD-10-CM

## 2023-02-28 DIAGNOSIS — S83209A Unspecified tear of unspecified meniscus, current injury, unspecified knee, initial encounter: Secondary | ICD-10-CM

## 2023-02-28 DIAGNOSIS — Z8249 Family history of ischemic heart disease and other diseases of the circulatory system: Secondary | ICD-10-CM

## 2023-02-28 DIAGNOSIS — E7800 Pure hypercholesterolemia, unspecified: Secondary | ICD-10-CM

## 2023-02-28 DIAGNOSIS — E785 Hyperlipidemia, unspecified: Secondary | ICD-10-CM

## 2023-02-28 DIAGNOSIS — C801 Malignant (primary) neoplasm, unspecified: Secondary | ICD-10-CM

## 2023-02-28 DIAGNOSIS — D539 Nutritional anemia, unspecified: Secondary | ICD-10-CM

## 2023-02-28 DIAGNOSIS — I10 Essential (primary) hypertension: Secondary | ICD-10-CM

## 2023-02-28 DIAGNOSIS — Z9013 Acquired absence of bilateral breasts and nipples: Secondary | ICD-10-CM

## 2023-02-28 DIAGNOSIS — M5134 Other intervertebral disc degeneration, thoracic region: Secondary | ICD-10-CM

## 2023-02-28 DIAGNOSIS — M2550 Pain in unspecified joint: Secondary | ICD-10-CM

## 2023-02-28 DIAGNOSIS — F3289 Other specified depressive episodes: Secondary | ICD-10-CM

## 2023-02-28 DIAGNOSIS — F32A Depression: Secondary | ICD-10-CM

## 2023-02-28 DIAGNOSIS — R519 Generalized headaches: Secondary | ICD-10-CM

## 2023-02-28 DIAGNOSIS — C50919 Malignant neoplasm of unspecified site of unspecified female breast: Secondary | ICD-10-CM

## 2023-02-28 DIAGNOSIS — M810 Age-related osteoporosis without current pathological fracture: Secondary | ICD-10-CM

## 2023-02-28 DIAGNOSIS — M5030 Other cervical disc degeneration, unspecified cervical region: Secondary | ICD-10-CM

## 2023-02-28 DIAGNOSIS — E039 Hypothyroidism, unspecified: Secondary | ICD-10-CM

## 2023-02-28 DIAGNOSIS — IMO0002 Ulcer: Secondary | ICD-10-CM

## 2023-02-28 DIAGNOSIS — M4800 Spinal stenosis, site unspecified: Secondary | ICD-10-CM

## 2023-02-28 DIAGNOSIS — R011 Cardiac murmur, unspecified: Secondary | ICD-10-CM

## 2023-02-28 DIAGNOSIS — K21 Gastroesophageal reflux disease with esophagitis without hemorrhage: Secondary | ICD-10-CM

## 2023-02-28 NOTE — Telephone Encounter
Patient is calling regarding surgery schedule. Please call her back.

## 2023-02-28 NOTE — Progress Notes
Patient Name: Cindy Randolph             MRN: [id number]  Date of Service: 02/28/2023   Referring: Self, Referral  No address on file  PCP: Hillery Jacks Safwan           Assessment:      The patient is 62 y.o.. year-old female  with the following pertinent medical history who presented to the clinic for follow up.      1.  Regurgitation predominant reflux  2. Small HH  3. Hx of breast cancer       Plan:   A long discussion with patient about different treatment for GERD as below:     Lifestyle and dietary modification -- Although several lifestyle and dietary modifications have been used in clinical practice, a systematic review of 16 randomized trials that evaluated the impact of these measures on GERD concluded that only weight loss and elevation of the head end of the bed improved esophageal pH-metry and/or GERD symptoms. uptodate  We suggest the following lifestyle and dietary measures:    - Weight loss for patients with GERD who are overweight or have had recent weight gain.  - Elevation of the head of the bed in individuals with nocturnal or laryngeal symptoms (eg, cough, hoarseness, throat clearing). This can be achieved either by putting six- to eight-inch blocks under the legs at the head of the bed or a Styrofoam wedge under the mattress. We also suggest a corollary to this recommendation: refraining from assuming a supine position after meals and avoidance of meals two to three hours before bedtime.  - We suggest selective elimination of dietary triggers (caffeine, chocolate, spicy foods, food with high fat content, carbonated beverages, and peppermint) in patients who note correlation with GERD symptoms and an improvement in symptoms with elimination. In a prospective study of [id number] women, intake of coffee, tea, and soda, but not milk, water, or juice were associated with a modest and dose-dependent increase in risk of GERD symptoms (HR 1.34, 1.26 and 1.29. Substitution of two servings per day of coffee, tea, or soda with water was associated with a small reduction in GERD symptoms (HR, 0.96; tea HR, 0.96; and soda HR, 0.92, respectively).     Other measures that have a physiologic basis but have not consistently been demonstrated to improve reflux symptoms include:  - Avoidance of tight-fitting garments to prevent increasing intragastric pressure and the gastroesophageal pressure gradient.   - Promotion of salivation through oral lozenges/chewing gum to neutralize refluxed acid and increase the rate of esophageal acid clearance.  - Avoidance of tobacco and alcohol, as both reduce lower esophageal sphincter pressure and smoking also diminishes salivation.   - Abdominal breathing exercises to strengthen the antireflux barrier of the lower esophageal sphincter.     PPIs are generally safe but current literatures suggest that PPIs are associated with a:   - 50% increase in risk of CKD  - 3/4 increase in risk of Clostridium difficile infection  - 1/3 Increase in risk of pneumonia  - 1/3 increase in risk of all fractures     Today, we discussed about medical management of acid reflux disease versus surgical fundoplication versus endoscopic incisionless fundoplication (TIF).   We discussed about Nissen fundoplication, LINX procedure which required laparoscopy surgery, and potential  complication i.e. gastroparesis, gas bloating central, vagus nerve injury, etc.  Antireflux mucosectomy (ARMS) of GE junction area with effect of cicatrix post mucosectomy increased to barrier of  acid reflux into esophagus. Potential complication would be complication related to mucosectomy i.e. perforation, bleeding, and long-term dysphagia to scar tissue formation.  Endoscopic antireflux procedure such as transoral incisionless fundoplication (TIF) is an endoscopic option for patients with refractory acid reflux disease or patients who could not take PPI due to side effect. Other indications include laryngopharyngeal reflux, significant acid reflux, aspiration pneumonia. In case of coexistence of hiatal hernia >2 cm, may be reasonable to combine with hernia repair and TIF procedure as collaborative effort between gastroenterologist and surgeon.     TIF has been compared with PPI therapy in several randomized trials. TEMPO trial, TIF was compared to maximum standard dose PPI therapy on refractory reflux patients. At six months, TIF group had better control of extra esophageal symptoms of reflux and regurgitation (62% vs 5%).  90% of patients with TIF were off of PPI.  At 6 months, all patients on PPI group crossed to TIF.  At three years, 90 and 88 percent of patients reported elimination of troublesome regurgitation and all atypical symptoms, respectively. At five years, troublesome regurgitation was eliminated in 80 percent of patients; 34 percent were on daily PPI therapy, and the average total GERD health-related quality of life score improved from 22.2 (baseline) to 6.8.  Additionally, TIF is associated with fewer postoperative adverse effects such as gas bloating and dysphagia compared with surgical fundoplication.      Technical success rate of TIF is up to 99%. Transoral incisionless fundoplication is generally safe. There is a slight chance of bleeding, but side effects are usually minor and temporary, mainly sore throat, shoulder pain, temporary difficulty swallowing, chest pain, difficulty belching or chest pain.  2% of patients might experience major complications, such as a tear or internal bleeding, nerve damage, abnormal connection between two organs (fistula). For a large number of patient, TIF provides significant GERD symptom relief and improved quality of life. Most patients (91%) also experience reduction in hiatal hernia and many (89%) are able to stop taking PPI medicine. Symptom relief after TIF typically lasts from eight to 10 years, which is similar to relief in most cases of Nissen fundoplication treatments. When the GERD symptoms return, some people may need to repeat TIF, resume taking PPIs or explore other treatments. Small hiatal hernias can often be repaired during the TIF procedure. Hiatal hernias larger than 2 centimeters cannot be treated with an endoscopic TIF procedure alone and may need to be repaired before TIF, during a laparoscopic surgical procedure called cTIF (TIF with a concomitant hiatal hernia repair).     After the procedure, patient might be admitted to the hospital.  Patient will take antibiotic for 3 days.  Patient might need to take pain medication for a few days.    Patient will drink clear liquids only for the first 24 hours, then drink full liquids for one week, then eat soft foods/pur?e diet for one week and resume your regular diet during the third week, avoiding breads and meats for a month. Patient will gradually decrease the medication for acid reflux disease.  Patient is advised to stay away from vigorous physical activity for three weeks and avoid lifting anything heavier than 5 pounds, then resume aerobic exercise after three weeks but avoid lifting anything heavier than 50 pounds      All questions were answered to her satisfaction.        Pre-Procedure Instructions  Inform your doctor about all medications you are currently taking and provide a full history of  your medical conditions.   DO NOT take any diet aids or herbal supplements that contain ginkgo, garlic, or St. John?s Wort for 10 days prior to surgery.   DO NOT take any blood thinners, anti-inflammatory (arthritis) medications, vitamin E or fish oil for 7 days prior to your procedure.   DO NOT smoke or drink alcohol for 48 hours prior to your procedure.   DO NOT eat or drink anything for at least 12 hours before your procedure (or longer if instructed by your doctor). The TIF procedure cannot be performed if there is food in your stomach.  OK to take the medications your doctor has approved for you to take with a small amount of water.    Post-Procedure Instructions  Your doctor will determine whether it is necessary for you to spend the night in the hospital after your procedure.   For the first few days, you will experience some pain and/or discomfort in your chest and shoulder and you may have a sore throat, and/or some discomfort swallowing. These symptoms should resolve within the first week after your procedure and appropriate medication will be provided as needed. If symptoms do not resolve or if discomfort becomes more severe, notify your doctor immediately.   Continue to take your GERD medication after your procedure as recommended by your doctor. Your doctor gradually taper them off after you have recovered from TIF procedure.  Occasional heartburn is normal in healthy people and may depend on diet and other factors such as stress. If GERD symptoms recur, you should contact your doctor.   Even though the TIF? Procedure is incisionless, it is still surgery. Like any surgical procedure, success is dependent on how well you adhere to postprocedure instructions including:  -Dietary guidelines   -Physical activity, driving   -Medications   -Return to work   -Follow-up    Dietary Guidelines  The strength of your new antireflux valve is largely determined by how well it heals. What you eat and drink can dramatically impact the durability of your antireflux valve. You will be asked to follow a liquid diet followed by a mashed and soft food diet as your newly reconstructed valve heals.  If you experience heartburn, write down the food that gave you heartburn and avoid eating it. Talk to your doctor at your next visit about your food associated symptoms. Remember, its normal for some people (non-GERD patients) to occasionally experience heartburn from specific foods, and this may mean that your valve is functioning correctly.   If your symptoms persist, contact your doctor immediately.  During the 6-week post-procedure period, it?s important that you adhere to the following guidelines:  - Eat 4 to 5 small meals consisting of soft foods throughout the day.  - Take small bites and chew your food thoroughly for 30 seconds to avoid swallowing a large bolus of food.  - Avoid foods with coarse texture: nuts, raw fruits, and raw vegetables.  - Try not to vomit, cough, retch or strain. This can significantly affect the healing and effectiveness of your new antireflux valve.  - During the healing process, avoid foods and drinks that triggered your reflux in the past. You may reintroduce them slowly after healing.  - To avoid chest pain take small bites, chew for 30 seconds and gradually thicken the texture of your food.   - Remain in an upright position for 1 hour after eating.  - Do not eat for at least 2 hours before bedtime.  - Do  not drink carbonated beverages or alcohol.  - Avoid spicy, very hot or very cold food or drinks.  - Follow your doctor?s instructions to wean yourself off antireflux medications.  - Do not smoke.  - Avoid gas-forming, acid producing foods, or foods that slow gastric emptying such as tomato-based products, peppermint, black pepper, caffeinated drinks, alcohol, onions, green peppers, fatty foods, beans, spicy foods, citrus fruits, and fiber supplements.   - Taking over the counter anti-gas medications may be helpful    WEEK 1 and 2: The first 2 weeks after your procedure are extremely important. That?s why we ask you to be particularly cautious with your diet.   You will stay on clear liquids for the fi rst 1-3 days after your TIF procedure. This diet contains only fluids that are clear and very low in sugar. However, it is not nutritionally balanced and will only be used a few days.   Avoid beverages with alcohol, caffeine, carbonation (soft drinks), or acidic drinks (tomato, grapefruit, and orange juice).   Foods that are allowed: Water, plain or lightly flavored (non-carbonated) drinks, Milk, decaffeinated tea, caffeine-free drinks, Diluted electrolyte drinks, Strained soups, apple or white grape juice  Non-acidic fruit or vegetable juices (without chunks), Liquid puddings and creams, room temperature sherbets or Ice-creams (without chunks), Milkshakes, Baby food  - Be sure to drink a minimum of 4-8 oz of water between each meal. Do not take large gulps. Sip clear liquids and rest between sips. Allow 20 minutes to drink ? cup. You may sip on fluids all day if you wish but at least 6-8 times per day.   - Take your prescribed medications. PPIs should be taken for at least 2 weeks and wean off according to physician recommendation.   - If pills/capsules are larger than a peanut, discuss with your pharmacist if they can be halved, crushed or if there are liquid options available to minimize hard swallowing.  - Take vitamin/mineral supplements every day being mindful of pill/capsule size as noted above. This will help prevent vitamin and mineral deficiencies.   - It?s helpful to eat a very low fat diet to minimize heartburn symptoms.  - Restaurant foods are not recommended during the first few weeks.  - The following protein supplements can be used starting on day 4: Protein-enriched commercially available shakes. You can also add one scoop of concentrated protein powder to your bowl of soup or glass of juice.    WEEK 3 to 6: Mechanical soft diet (puree/ baby food consistency).   This diet consists of blended foods with one new solid food added daily. Portions should be small and not exceeding 1 cup to help prevent vomiting and proper healing of your newly reconstructed valve.  Foods that are allowed: Mashed vegetables and/or potatoes, Oatmeal, Soups (without chunks), Tofu, Slowly introduce well-cooked, finely ground food such as fish or [place]    Food to avoid: Raw or undercooked food, Alcoholic and carbonated beverages, Pasta, bread  Cakes, pancakes, waffles, cookies, etc. Chips, french fries, popcorn, etc. Pepper and hot sauces, Dry fruits and cereals, High-fat food.     Activity: Do not lift anything over 5 pounds for the first 2 weeks. During weeks 3-6, you may lift items up to 10 pounds. Beginning in week 7, lift items you normally would.  Sports and other intense exercise should be avoided for the first 6 weeks following your procedure.   Driving may be resumed 1-2 days after the procedure, only if not under influence  of narcotics.   Sex may be resumed after 7 days.       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    The patient, with a history of breast cancer, presented for a consultation regarding their ongoing issues with acid reflux disease. They reported experiencing heartburn and a sensation of food and acid regurgitating, particularly when off omeprazole. The patient's symptoms were confirmed by a PH study, which showed acid reflux disease with the longest duration of acid washing the esophagus being almost half an hour. The patient also reported that they had been experiencing these symptoms for several days.    The patient had previously met with Dr. Lenn Sink and was scheduled for cTIF in 2025. However, the patient expressed concern about the wait time for the procedure, as they had already met their insurance deductibles for the year.    The patient is retired and lives with their spouse and two teenage children. They reported that they had recently had three scans for their breast cancer, all of which showed no evidence of disease. The patient is currently taking omeprazole, Crestor, Synthroid, and tramadol, the latter of which they expressed a dislike for due to its side effects.    HRM: normal except HH.  Esophagogram in 01/2023: 1.  Tiny hiatal hernia.   2.  Indwelling Bravo device.   3.  Otherwise, unremarkable esophagram.       EGD 01/2023:   Esophagogastric landmarks were identified: the gastroesophageal junction        was found at 37 cm and the site of hiatal narrowing was found at 39 cm        from the incisors.        The entire examined stomach was normal.        The ampulla, duodenal bulb, first portion of the duodenum and second        portion of the duodenum were normal.        A 2 cm hiatal hernia was present, AFS grade 3 (L2, D3, F-).        LA Grade B (one or more mucosal breaks greater than 5 mm, not extending        between the tops of two mucosal folds) esophagitis with no bleeding was        found 37 cm from the incisors. The BRAVO capsule with delivery system        was introduced through the mouth and advanced into the esophagus, such        that the BRAVO pH capsule was positioned 31 cm from the incisors, which        was 6 cm proximal to the GE junction. The BRAVO pH capsule was then        deployed and attached to the esophageal mucosa. The delivery system was        then withdrawn. Endoscopy was utilized for probe placement and        diagnostic evaluation. The scope was reinserted to evaluate placement of        the BRAVO capsule. Visualization showed the BRAVO capsule to be in an        appropriate position.  EGD 05/2022:    The Z-line was regular and was found 37 cm from the incisors. A        guidewire was placed and the scope was withdrawn. Dilation was performed        with a Savary dilator with mild resistance at 18 mm. The dilation site        was examined following endoscope reinsertion and showed mild mucosal        disruption in the upper esophagus .        The gastroesophageal flap valve was visualized endoscopically and        classified as Hill Grade IV (no fold, wide open lumen, hiatal hernia        present).        Normal mucosa was found in the entire examined stomach.        The examined duodenum was normal.        A 3 cm hiatal hernia was present.   Vitals:    02/28/23 0850   BP: 130/87   Pulse: 82   Temp: 36.4 ?C (97.5 ?F)   Resp: 18   SpO2: 100%       I explained the diagnosis and management plan in detail. No barrier to education was noted. she understood me well and repeated her understanding. I answered all her questions and concerns.   Total time spent with the patient, obtaining and/or reviewing outside records, pertinent hisotry, coordinating her care, counseling patient and educating the patient/family/physician (see the above), and preparing her note and chart is 45 minutes.   Jolee Ewing, MD  02/28/2023 9:13 AM      This note was in part completed with Dragon, a voice to text dictation system. Some errors may have occured and persist despite my best efforts to edit this document to eliminate dictation/translation related errors.  If you have questions/concerns, please contact me for clarification     Answers submitted by the patient for this visit:  Review Of Systems (Submitted on 02/25/2023)  Env Allergies: No  Food allergies: Yes  Immunocompromised: No  Eye drainage: No  Eye itching: No  Eye pain: No  Eye redness: No  Light sensitive: No  Floating items in the line of sight: No  Arthralgias: Yes  Back pain: Yes  Walking problem: No  Joint swelling: No  Myalgias: No  Neck pain: Yes  Neck stiffness: No  Agitation: No  Behavior problem: No  Confusion: No  Decreased concentration: No  Dysphoric mood: No  Hallucinations: No  Hyperactive: No  Nervous/anxious: No  Self-Injury: No  Sleep disturbance: No  Suicidal ideas: No  Dizziness: No  Facial asymmetry: No  Headaches: Yes  Light-headedness: No  Numbness: No  Seizures: No  Speech difficulty: No  Syncope: No  Tremors: No  Weakness: No  Chest Pain: No  Leg swelling: No  Palpitations: No  Difficulty urinating: No  Pain with sex/intercourse: Yes  Pain with urinating: No  Bedwetting: No  Pain on your side (Flank pain): No  Frequent uriniation: No  Genital sore: No  Blood in urine: No  Menstrual problem: No  Pelvic pain: No  Urgency: No  Urine decreased: No  Vaginal bleeding (not menstrual): No  Vaginal discharge: No  Vaginal pain: No  Short periods of not breathing (apnea): No  Chest tightness: No  Choking: No  Cough: No  Shortness of breath: No  High pitched wheezing (Stridor): No  Wheezing: No  Lower activity level: No  Decrease in appetite: No  Chills: No  Crying: No  Increase in sweating: No  More tired than usual: No  Fever: No  Unexpected weight change: No  Adenopathy: No  Bruises/bleeds easily: No  Color change: No  Pale skin: No  Rash: No  Wound: No  Cold intolerance: No  Increase in thirst: No  Increase in hunger: No  Increase in urine output: No  Congestion: No  Dental Problem: No  Drooling: No  Ear discharge: No  Ear pain: No  Facial swelling: No  Hearing loss: No  Mouth sores: No  Nosebleeds: No  Postnasal drip: No  Runny nose: No  Sinus pain: No  Sinus pressure: No  Sneezing: No  Sore throat: No  Ringing in ears: Yes  Trouble swallowing: No  Voice change: No

## 2023-03-01 ENCOUNTER — Encounter: Admit: 2023-03-01 | Discharge: 2023-03-01 | Payer: MEDICARE

## 2023-03-03 ENCOUNTER — Encounter: Admit: 2023-03-03 | Discharge: 2023-03-03 | Payer: MEDICARE

## 2023-03-03 NOTE — Telephone Encounter
Hello -     Pt called in stating she missed a call from a nurse. She saw the message on My chart only that's how she knows that she was contacted ,but no voicemail was left. She asking for a call back for surgery date.     Thanks

## 2023-03-03 NOTE — Telephone Encounter
See separate encounter

## 2023-03-04 ENCOUNTER — Encounter: Admit: 2023-03-04 | Discharge: 2023-03-04 | Payer: MEDICARE

## 2023-03-05 ENCOUNTER — Encounter: Admit: 2023-03-05 | Discharge: 2023-03-05 | Payer: MEDICARE

## 2023-03-08 ENCOUNTER — Ambulatory Visit: Admit: 2023-03-08 | Discharge: 2023-03-09 | Payer: MEDICARE

## 2023-03-08 ENCOUNTER — Encounter: Admit: 2023-03-08 | Discharge: 2023-03-08 | Payer: MEDICARE

## 2023-03-08 DIAGNOSIS — M2550 Pain in unspecified joint: Secondary | ICD-10-CM

## 2023-03-08 DIAGNOSIS — I10 Essential (primary) hypertension: Secondary | ICD-10-CM

## 2023-03-08 DIAGNOSIS — R519 Generalized headaches: Secondary | ICD-10-CM

## 2023-03-08 DIAGNOSIS — W19XXXA Unspecified fall, initial encounter: Secondary | ICD-10-CM

## 2023-03-08 DIAGNOSIS — E7800 Pure hypercholesterolemia, unspecified: Secondary | ICD-10-CM

## 2023-03-08 DIAGNOSIS — Z923 Personal history of irradiation: Secondary | ICD-10-CM

## 2023-03-08 DIAGNOSIS — E785 Hyperlipidemia, unspecified: Secondary | ICD-10-CM

## 2023-03-08 DIAGNOSIS — M810 Age-related osteoporosis without current pathological fracture: Secondary | ICD-10-CM

## 2023-03-08 DIAGNOSIS — R011 Cardiac murmur, unspecified: Secondary | ICD-10-CM

## 2023-03-08 DIAGNOSIS — Z9013 Acquired absence of bilateral breasts and nipples: Secondary | ICD-10-CM

## 2023-03-08 DIAGNOSIS — E890 Postprocedural hypothyroidism: Secondary | ICD-10-CM

## 2023-03-08 DIAGNOSIS — K219 Gastro-esophageal reflux disease without esophagitis: Secondary | ICD-10-CM

## 2023-03-08 DIAGNOSIS — E039 Hypothyroidism, unspecified: Secondary | ICD-10-CM

## 2023-03-08 DIAGNOSIS — F419 Anxiety disorder, unspecified: Secondary | ICD-10-CM

## 2023-03-08 DIAGNOSIS — S83209A Unspecified tear of unspecified meniscus, current injury, unspecified knee, initial encounter: Secondary | ICD-10-CM

## 2023-03-08 DIAGNOSIS — M549 Dorsalgia, unspecified: Secondary | ICD-10-CM

## 2023-03-08 DIAGNOSIS — F3289 Other specified depressive episodes: Secondary | ICD-10-CM

## 2023-03-08 DIAGNOSIS — M4800 Spinal stenosis, site unspecified: Secondary | ICD-10-CM

## 2023-03-08 DIAGNOSIS — D539 Nutritional anemia, unspecified: Secondary | ICD-10-CM

## 2023-03-08 DIAGNOSIS — C50919 Malignant neoplasm of unspecified site of unspecified female breast: Secondary | ICD-10-CM

## 2023-03-08 DIAGNOSIS — F32A Depression: Secondary | ICD-10-CM

## 2023-03-08 DIAGNOSIS — Z8249 Family history of ischemic heart disease and other diseases of the circulatory system: Secondary | ICD-10-CM

## 2023-03-08 DIAGNOSIS — J302 Other seasonal allergic rhinitis: Secondary | ICD-10-CM

## 2023-03-08 DIAGNOSIS — C801 Malignant (primary) neoplasm, unspecified: Secondary | ICD-10-CM

## 2023-03-08 DIAGNOSIS — Z789 Other specified health status: Secondary | ICD-10-CM

## 2023-03-08 DIAGNOSIS — M51369 Degenerative disc disease, lumbar: Secondary | ICD-10-CM

## 2023-03-08 DIAGNOSIS — B999 Unspecified infectious disease: Secondary | ICD-10-CM

## 2023-03-08 DIAGNOSIS — G629 Polyneuropathy, unspecified: Secondary | ICD-10-CM

## 2023-03-08 DIAGNOSIS — M5030 Other cervical disc degeneration, unspecified cervical region: Secondary | ICD-10-CM

## 2023-03-08 DIAGNOSIS — C50112 Malignant neoplasm of central portion of left female breast: Secondary | ICD-10-CM

## 2023-03-08 DIAGNOSIS — C73 Malignant neoplasm of thyroid gland: Secondary | ICD-10-CM

## 2023-03-08 DIAGNOSIS — IMO0002 Ulcer: Secondary | ICD-10-CM

## 2023-03-08 DIAGNOSIS — E079 Disorder of thyroid, unspecified: Secondary | ICD-10-CM

## 2023-03-08 DIAGNOSIS — R32 Unspecified urinary incontinence: Secondary | ICD-10-CM

## 2023-03-08 DIAGNOSIS — Z85850 Personal history of malignant neoplasm of thyroid: Secondary | ICD-10-CM

## 2023-03-08 DIAGNOSIS — M5134 Other intervertebral disc degeneration, thoracic region: Secondary | ICD-10-CM

## 2023-03-08 NOTE — Patient Instructions
Our plan:   - Hold biotin for 3 days leading up to lab draws.     My nurse, April, can be reached directly at [phone].     Please let us know if you have any questions.     Great to see you today!    - Jonna Clark, PA-C

## 2023-03-08 NOTE — Progress Notes
Date of Service: 03/08/2023    Subjective:             Cindy Randolph is a 62 y.o. female.    History of Present Illness  Cindy Randolph presents for follow up visit for hypothyroidism.  She was last seen by Dr. Okey Dupre in 2023.   Cindy Randolph was diagnosed with thyroid cancer at the age of 45 and is at postsurgical hypothyroidism since that time.  She did not undergo radioactive iodine and was told it was a very slow-growing cancer.  She is currently taking brand synthroid 100 mcg/day.  She takes her levothyroxine 30 minutes before breakfast or other medications. She was previously taking it around the same time as her PPI and had an elevated TSH, but TSH improved after she switched to taking it > 30 minutes before the PPI. She is feeling much better since making this change. She takes calcium at bedtime. She is working on weight loss through exercise and dietary modification.     Breast cancer was diagnosed in August 2022.  She is following with [facility] for this.  She underwent surgery chemotherapy and radiation.  Last chemotherapy was November 2022. She was on letrozole, but has since stopped this.     Bone density scan obtained in November 2022 with oncology shows osteoporosis with lowest T score -2.6 and hips.  She was prescribed zolendronic acid by oncology-- she had two doses of zometa. She had flu like symptoms after the first dose and had a sudden increase in arthritis after the 2nd dose and she doesn't want to take an additional dose. She relays that she plans to repeat bone density prior to her next visit with oncology in December.     Lab Results   Component Value Date    TSH 1.60 03/04/2023    TSH 12.01 (H) 02/02/2023        Review of Systems  Comprehensive review of systems was obtained and is negative except as mentioned in HPI.    Past Medical History:   Diagnosis Date    Accidental fall 02/23/22    Knee gave out and fell down stairs    Acquired hypothyroidism     Anxiety 2016    from pain    Back pain Breast cancer in female [facility]) 12/2020    Left    Cancer of thyroid (HCC) 1980    Degenerative disc disease, cervical 1994    Degenerative disc disease, lumbar 2010    Degenerative disc disease, thoracic 2010    Depression situational, transient    after divorce, after death of 2nd husband    Depressive disorder, not elsewhere classified 7/23    Now on Lexapro; it is effective    Essential hypertension began in 3rd trimester most pregnancies    remained after last pregnancy    Essential hypertension, benign as above    Family history of coronary artery disease in brother 06/17/2022    04/01/2022- Per OV Dr. Maisie Fus     Generalized headaches 1996-2010    probably hormonally related    GERD (gastroesophageal reflux disease)     H/O total thyroidectomy 05/24/1978    Heart murmur at birth    Dr. Maisie Fus said he didn't hear it a few years ago    High cholesterol     History of bilateral mastectomy 06/22/2022    History of external beam radiation therapy 07/15/2021    History of thyroid cancer 06/22/2022    TSH 0.39 and  pt not taking med regularly until about 2 weeks ago.  Will have her repeat TSH in 3 weeks and f/u after.  Needs to be between 0.1 and 0.3.  Will refer her back to Endo for f/u.    Incontinence 3/23    With coughing and sneezing and strong sudden mivemwnts    Infection     Joint pain 1994    Limb alert care status     No access on R-arm    Osteoporosis 03/2021    Bilat hips    Other and unspecified hyperlipidemia 11/23    Will see cardiologist early 2024    Other malignant neoplasm without specification of site thyroid, 1980    thyroidectomy    Peripheral neuropathy 2022    Probably earlier but worsened with chemo    Seasonal allergic reaction 2004    Had rast testing done. Allergic to cow dander.    Spinal stenosis 2017 to present    per MRIs    Thyroid disorder as noted    Torn meniscus 12/25/2020    Ulcer 1987    with divorce; resolved    Unspecified deficiency anemia 2009    from excessive bleeding post-partum; treated iron     Surgical History:   Procedure Laterality Date    HX WRIST FRACTURE SURGERY  1993    Baker's thumb with fixation    ARTHROPLASTY  2001    L ACL    COLONOSCOPY  2017    normal    ARTHROSCOPY KNEE WITH PARTIAL LATERAL MENISCECTOMY AND LEFT KNEE INJECTION. Right 01/08/2021    Performed by Tanja Port, MD at [facility] OR    BILATERAL TOTAL MASTECTOMIES Bilateral 01/22/2021    Performed by Massie Kluver, MD at [facility] OR    INTRAOPERATIVE SENTINEL LYMPH NODE IDENTIFICATION WITH/ WITHOUT NON-RADIOACTIVE DYE INJECTION Left 01/22/2021    Performed by Massie Kluver, MD at [facility] OR    INJECTION RADIOACTIVE TRACER FOR SENTINEL NODE IDENTIFICATION Left 01/22/2021    Performed by Massie Kluver, MD at [facility] OR    LEFT AXILLARY SENTINEL LYMPH NODE BIOPSY Left 01/22/2021    Performed by Massie Kluver, MD at [facility] OR    BILATERAL CHEST FLAT CLOSURE Bilateral 01/22/2021    Performed by Stevenson Clinch, MD at [facility] OR    BILATERAL CHEST FLAT CLOSURE x 8 Bilateral 01/22/2021    Performed by Stevenson Clinch, MD at [facility] OR    EXCISION BENIGN LESION 0.5 CM OR LESS - TORSO Right 01/22/2021    Performed by Stevenson Clinch, MD at [facility] OR    TUNNELED VENOUS PORT PLACEMENT Right 02/19/2021    Placement of port-a-cath - 26F Right 02/19/2021    Performed by Freund, Alecia Lemming., MD at [facility] OR    FLUOROSCOPIC GUIDANCE CENTRAL VENOUS ACCESS DEVICE PLACEMENT/ REPLACEMENT/ REMOVAL N/A 02/19/2021    Performed by Carloyn Manner., MD at [facility] OR    Left Completion Axillary Lymph Node Dissection Left 05/14/2021    Performed by Massie Kluver, MD at [facility] OR    REMOVAL TUNNELED CENTRAL VENOUS ACCESS DEVICE INCLUDING PORT/ PUMP Right 05/14/2021    Performed by Massie Kluver, MD at [facility] OR    COLONOSCOPY DIAGNOSTIC WITH SPECIMEN COLLECTION BY BRUSHING/ WASHING - FLEXIBLE N/A 10/14/2021    Performed by Benetta Spar, MD at [facility] OR    ESOPHAGOGASTRODUODENOSCOPY WITH SPECIMEN COLLECTION BY BRUSHING/ WASHING N/A  06/17/2022    Performed by Remigio Eisenmenger, MD at [facility] ENDO    ESOPHAGOGASTRODUODENOSCOPY WITH DILATION ESOPHAGUS WITH BALLOON 30 MM OR GREATER - FLEXIBLE N/A 06/17/2022    Performed by Remigio Eisenmenger, MD at [facility] ENDO    ANGIOGRAPHY CORONARY ARTERY WITH LEFT HEART CATHETERIZATION N/A 07/16/2022    Performed by Harley Alto, MD at [facility] CATH LAB    PERCUTANEOUS CORONARY STENT PLACEMENT WITH ANGIOPLASTY N/A 07/16/2022    Performed by Harley Alto, MD at [facility] CATH LAB    ESOPHAGEAL MOTILITY STUDY N/A 02/08/2023    Performed by Jolee Ewing, MD at [facility] ENDO    ESOPHAGOGASTRODUODENOSCOPY WITH BIOPSY - FLEXIBLE N/A 02/08/2023    Performed by Jolee Ewing, MD at [facility] ENDO    GASTROESOPHAGEAL REFLUX TEST WITH MUCOSAL ATTACHED TELEMETRY PH ELECTE N/A 02/08/2023    Performed by Jolee Ewing, MD at [facility] ENDO    ESOPHAGOGASTRODUODENOSCOPY WITH DILATION GASTRIC/ DUODENAL STRICTURE - FLEXIBLE N/A 02/08/2023    Performed by Jolee Ewing, MD at [facility] ENDO    ACL RECONSTRUCTION  06/03/1999    BREAST SURGERY  01/24/21    Bilat mastectomy    ECHOCARDIOGRAM PROCEDURE      FRACTURE SURGERY  1993    ORIF L 1st metacarpal    HX CARPAL TUNNEL RELEASE  1996    With second and other oregbnancies    KNEE SURGERY  L ACL as above    LYMPH NODE DISSECTION  12/22    Left    THYROIDECTOMY       Family History   Problem Relation Name Age of Onset    Arthritis Paternal Grandmother Ted Mcalpine         multiple heberdens nodules and deformities    Back pain Paternal Grandmother Ted Mcalpine     Arthritis-osteo Paternal Grandmother Ted Mcalpine     Diabetes Paternal Grandmother Ted Mcalpine         Type 2 as older adult    Arthritis Mother Carmina Miller         wear and tear    Back pain Mother Carmina Miller     Hypertension Mother Carmina Miller     Joint Pain Mother Carmina Miller     Neck Pain Mother Carmina Miller     Cancer-Breast Mother Carmina Miller 64        was on HRT for 10-15 years    Cancer Mother Carmina Miller         Breast CA, post menopausal, estrogen-sensitive    Miscarriages Mother Carmina Miller         3-5 and a stillbirth    Basal Cell Carcinoma Brother Onalee Hua (Brother)     Back pain Brother Onalee Hua (Brother)         Has had multiple laminectomy surgeries    Hypertension Brother Onalee Hua (Brother)     Asthma Brother Onalee Hua (Brother)         Worst when a child    Diabetes Brother Onalee Hua (Brother)         Non-insulin dependent, 2022    Basal Cell Carcinoma Brother Chip     Back pain Brother Chip         Arthritis    Hypertension Brother Chip     Diabetes Brother Chip         Non-insulin dependent, 2023    Basal Cell Carcinoma Brother Isaias Cowman     Back pain Brother Isaias Cowman         Arthritis  Hypertension Brother Isaias Cowman     Heart problem Brother Isaias Cowman         Stent in LAD, positive apo(a)    Diabetes Brother Isaias Cowman         Non insulin dependent, 2023    Diabetes Father Alinda Dooms         Type 2    Heart problem Father Alinda Dooms         CABG 3    Heart Disease Father Alinda Dooms         Occult MI in early 64s; later found with CAD; subsequent Triple CABG. In hos 60s started amiodarone    Hypertension Father Alinda Dooms     Alcohol abuse Father Alinda Dooms     Early Death Father Alinda Dooms         Age 60, complications diabetes and heart disease    Diabetes Maternal Grandfather Elam Dutch         Type 2 as older adult    Birth Defect Daughter Rhoderick Moody         PRS, congenital diaphragmatic hernia, malrotation, grey matter heterotopia    Stroke Maternal Uncle Jillyn Hidden         In his 76s    Thyroid Disease Maternal Uncle Jillyn Hidden     Diabetes Paternal Grandfather Grandpa     Birth Defect Nephew          craniosynostosis    Thyroid Disease Maternal Uncle Visual merchandiser     Cancer-Breast Maternal Great-Aunt  70    Cancer Other Karl Luke         Mother?s maternal aunt     Social History     Socioeconomic History    Marital status: Married    Number of children: 8   Occupational History    Occupation: Retired Engineer, civil (consulting)     Comment: Worked in [employer] and Hospice; also worked in the ambulatory setting   Tobacco Use    Smoking status: Never     Passive exposure: Past    Smokeless tobacco: Never    Tobacco comments:     Significant second hand smoke from parents 79-71 years old   Vaping Use    Vaping status: Never Used   Substance and Sexual Activity    Alcohol use: Not Currently     Comment: Prior to 2020, socially, one drink once a week at most    Drug use: Never     Comment: in late teens, early twenties, a few times and not since    Sexual activity: Not Currently     Partners: Male     Birth control/protection: Post-menopausal, None     Vaping/E-liquid Use    Vaping Use Never User                 Objective:          acetaminophen (TYLENOL) 325 mg tablet Take two tablets by mouth every 6 hours. Take scheduled for 3 days after surgery, then as needed. Do not exceed 4,000mg  in a 24 hour period.    amLODIPine (NORVASC) 5 mg tablet Take one tablet by mouth daily.    aspirin 81 mg chewable tablet Chew one tablet by mouth daily.    biotin 1 mg cap Take one capsule by mouth daily.    CALCIUM PO Take 600 mg by mouth daily.    CHOLEcalciferoL (vitamin D3) 1,000 units tablet Take one tablet by mouth daily. Takes 5000  units    clobetasoL (TEMOVATE) 0.05 % topical ointment Apply  topically to affected area twice daily. Apply to areas of rash on hands twice daily; do NOT use on face/groin/underarms, use up to 2 weeks per month    diclofenac sodium (VOLTAREN) 1 % topical gel Apply four g topically to affected area daily as needed.    ezetimibe (ZETIA) 10 mg tablet Take one tablet by mouth daily.    Fish,Bora,Flax Oils-OM3,6,9 #1 (TRIPLE OMEGA) 400-400-400 mg capsule Take one capsule by mouth daily.    metoprolol succinate XL (TOPROL XL) 25 mg extended release tablet Take one tablet by mouth at bedtime daily.    Miscellaneous Medical Supply misc DME requirements, rx to include the following typed information:    NPI: [id number]  Doctor's typed name: Assunta Curtis, MD  ICD 10: G47.33  Length of need: 99 months  Start date:02/14/2023  Signature date: 02/14/2023  Detailed equipment: CPAP machine with any accessories.    omeprazole DR (PRILOSEC) 40 mg capsule Take one capsule by mouth daily.    other medication K Complete K1 & K2 as MK-4 & MK-7 : Take 1 softgel by mouth once daily    rosuvastatin (CRESTOR) 10 mg tablet Take one tablet by mouth daily.    Selenium 100 mcg tab Take one tablet by mouth daily.    SYNTHROID 100 mcg tablet Take one tablet by mouth daily 30 minutes before breakfast.    traMADoL 25 mg tablet Take one tablet by mouth daily as needed.    vitamins, B complex tab Take one tablet by mouth daily.    vitamins, multiple cap Take one capsule by mouth daily.    zinc sulfate 220 mg (50 mg elemental zinc) capsule Take one capsule by mouth daily.     Vitals:    03/08/23 0816   BP: 118/87   Pulse: 85   Temp: 36.7 ?C (98.1 ?F)   Resp: 16   SpO2: 100%   TempSrc: Temporal   PainSc: Five   Weight: 70.3 kg (155 lb)   Height: 157.5 cm (5' 2)     Body mass index is 28.35 kg/m?Marland Kitchen     Physical Exam  Nursing note and vitals reviewed.  Constitutional: she is oriented to person, place, and time. she appears well-nourished. No distress.   HENT:   Head: Normocephalic and atraumatic.   Mouth/Throat: Oropharynx is clear and moist.   Eyes: Conjunctivae normal and EOM are normal. Pupils are equal, round, and reactive to light.   Neck: Neck supple.  Thyroid surgically absent.  Cardiovascular: Normal rate, regular rhythm and normal heart sounds.    Pulmonary/Chest: Effort normal and breath sounds normal. No respiratory distress.   Musculoskeletal:she exhibits no edema and no tenderness.   Lymphadenopathy:     she has no cervical adenopathy.   Neurological: she is alert and oriented to person, place, and time.     No tremor on outstretched hands.  Skin: Skin is warm and dry. No erythema.   Psychiatric: She has a normal mood and affect. Her behavior is normal. Thought content normal.      Lab Results   Component Value Date    TSH 1.60 03/04/2023            Assessment and Plan:  Cindy Randolph was seen today for new patient.    Diagnoses and all orders for this visit:    Postoperative hypothyroidism  Total thyroidectomy at the age of 42 for reported thyroid  cancer that was slow-growing.  Currently taking synthroid 100 mcg daily   Most recent TSH at goal-- continue current dose   She does take a biotin supplement-- we discussed holding this for 3 days leading up to lab draws     Age-related osteoporosis without current pathological fracture  Lowest T score  -2.6 in November 2022, before starting letrozole.  Zoledronic acid x 2 ordered by oncology   She does not want to take further doses   Pending repeat DEXA (already ordered by oncology)     Malignant neoplasm of central portion of left breast in female, estrogen receptor positive (HCC)  Following with Dr. Donneta Romberg  She has undergone chemotherapy and radiation therapy    RTC 1 year or sooner PRN     Rod Can, PA-C                          Total Time Today was 30 minutes in the following activities: Preparing to see the patient, Obtaining and/or reviewing separately obtained history, Performing a medically appropriate examination and/or evaluation, Counseling and educating the patient/family/caregiver, Ordering medications, tests, or procedures, Documenting clinical information in the electronic or other health record, Independently interpreting results (not separately reported) and communicating results to the patient/family/caregiver, and Care coordination (not separately reported)

## 2023-03-09 ENCOUNTER — Encounter: Admit: 2023-03-09 | Discharge: 2023-03-09 | Payer: MEDICARE

## 2023-03-10 ENCOUNTER — Encounter: Admit: 2023-03-10 | Discharge: 2023-03-10 | Payer: MEDICARE

## 2023-03-10 ENCOUNTER — Ambulatory Visit: Admit: 2023-03-10 | Discharge: 2023-03-11 | Payer: MEDICARE

## 2023-03-10 NOTE — Procedures
Electrodiagnostic Laboratory Report    Impression:    1. There is no electrodiagnostic evidence of a left or right L2-S1 radiculopathy in the muscles tested. There is some acute finding (1+ sharp waves) in right gastrocnemius muscle of undetermined significance.   2. There is no evidence of a left or right tibial, peroneal or sural mononeuropathy.     Clinical Correlation:  Follow up with referring provider.     Thank you for allowing me to perform electrodiagnostic testing on your patients.  A full EMG/NCS report will be scanned into [facility], including waveforms.  If you have any further questions or comments, please do not hesitate to call.    Rica Koyanagi, DO  Physical Medicine and Rehabilitation

## 2023-03-10 NOTE — Research Notes
Clinical Research Note: [id number] - Hypofractionated radiation therapy for patients with breast cancer receiving regional nodal irradiation  HSC: [id number]  Today's Visit: 18 month post radiation, EOT    *Late entry    18 month follow up, participant saw [facility], NP. Physical, Karnofsky and lymphedema were all assessed.    The following AEs were reported during this interview:   Adverse  Event Grade Attrib to:    Expected Action Taken Start Date Stop Date Outcome   hypothyroidism 2     [CONMED: levothyroxine 112 mcg Baseline Ongoing Ongoing   hypertension 2    baseline ongoing ongoing   Arthralgia (back, neck) 2   CONMED: NSAIDs 1980 ongoing ongoing   Depression 1   CONMED: lexapro 15 mg baseline ongoing ongoing   Gastroesophageal reflux disease 2    baseline ongoing ongoing   Dysphagia  2 probably yes [CONMED: Magic Mouthwash, OTC Tylenol  06/29/2021 08/05/2022 resolved   headache 1 unrelated no [CONMED: Tylenol 06/29/2021 08/05/2022 resolved   Dermatitis radiation 1 definitely yes [CONMED: SSD cream, 2.5% hydrocortisone cream 06/30/2021 08/05/2022 resolved   fatigue 1 probably yes none 06/16/2021 08/05/2022 resolved   Chest pain 2 unrelated no CONMED: aspirin 81 mg, isosorbide mononitrate ER 30 mg (IMDUR) 07/16/2022 07/18/2022 resolved   Gastrointestinal disorders- other: Hiatal hernia 2 unrelated no CONMED: metoprolol succinate XL 25 mg,  07/16/2022 ongoing ongoing   Joint Range of motion left shoulder 1 probably no Physical therapy 08/05/2022 02/03/2023 resolved   Skin and subcutaneous disorders, others- tightness 1 Probably yes Physical therapy 07/30/2021 ongoing ongoing   Dyspnea  1 possible no none 07/16/2022 ongoing ongoing      1.Unrelated  2.Unlikely  3.Possible  4.Probably  5.Definitely       S. Catlin Aycock  [phone]

## 2023-03-11 ENCOUNTER — Encounter: Admit: 2023-03-11 | Discharge: 2023-03-11 | Payer: MEDICARE

## 2023-03-11 DIAGNOSIS — M5416 Radiculopathy, lumbar region: Secondary | ICD-10-CM

## 2023-03-11 DIAGNOSIS — M545 Low back pain, unspecified: Secondary | ICD-10-CM

## 2023-03-11 MED FILL — RXAMB NALTREXONE 1 MG ORAL CAPSULE (BATCHED COMPOUND): 33 days supply | Qty: 0.09 | Fill #1 | Status: CP

## 2023-03-15 ENCOUNTER — Ambulatory Visit: Admit: 2023-03-15 | Discharge: 2023-03-15 | Payer: MEDICARE

## 2023-03-15 ENCOUNTER — Encounter: Admit: 2023-03-15 | Discharge: 2023-03-15 | Payer: MEDICARE

## 2023-03-15 DIAGNOSIS — R931 Abnormal findings on diagnostic imaging of heart and coronary circulation: Secondary | ICD-10-CM

## 2023-03-15 DIAGNOSIS — E782 Mixed hyperlipidemia: Secondary | ICD-10-CM

## 2023-03-15 DIAGNOSIS — I77810 Thoracic aortic ectasia: Secondary | ICD-10-CM

## 2023-03-15 DIAGNOSIS — I10 Essential (primary) hypertension: Secondary | ICD-10-CM

## 2023-03-15 DIAGNOSIS — M549 Dorsalgia, unspecified: Secondary | ICD-10-CM

## 2023-03-15 DIAGNOSIS — Z8249 Family history of ischemic heart disease and other diseases of the circulatory system: Secondary | ICD-10-CM

## 2023-03-15 DIAGNOSIS — E785 Hyperlipidemia, unspecified: Secondary | ICD-10-CM

## 2023-03-15 DIAGNOSIS — M2550 Pain in unspecified joint: Secondary | ICD-10-CM

## 2023-03-15 DIAGNOSIS — F419 Anxiety disorder, unspecified: Secondary | ICD-10-CM

## 2023-03-15 DIAGNOSIS — Z85850 Personal history of malignant neoplasm of thyroid: Secondary | ICD-10-CM

## 2023-03-15 DIAGNOSIS — M810 Age-related osteoporosis without current pathological fracture: Secondary | ICD-10-CM

## 2023-03-15 DIAGNOSIS — M5134 Other intervertebral disc degeneration, thoracic region: Secondary | ICD-10-CM

## 2023-03-15 DIAGNOSIS — E7800 Pure hypercholesterolemia, unspecified: Secondary | ICD-10-CM

## 2023-03-15 DIAGNOSIS — E039 Hypothyroidism, unspecified: Secondary | ICD-10-CM

## 2023-03-15 DIAGNOSIS — C801 Malignant (primary) neoplasm, unspecified: Secondary | ICD-10-CM

## 2023-03-15 DIAGNOSIS — B999 Unspecified infectious disease: Secondary | ICD-10-CM

## 2023-03-15 DIAGNOSIS — W19XXXA Unspecified fall, initial encounter: Secondary | ICD-10-CM

## 2023-03-15 DIAGNOSIS — J302 Other seasonal allergic rhinitis: Secondary | ICD-10-CM

## 2023-03-15 DIAGNOSIS — R011 Cardiac murmur, unspecified: Secondary | ICD-10-CM

## 2023-03-15 DIAGNOSIS — S83209A Unspecified tear of unspecified meniscus, current injury, unspecified knee, initial encounter: Secondary | ICD-10-CM

## 2023-03-15 DIAGNOSIS — Z9013 Acquired absence of bilateral breasts and nipples: Secondary | ICD-10-CM

## 2023-03-15 DIAGNOSIS — D539 Nutritional anemia, unspecified: Secondary | ICD-10-CM

## 2023-03-15 DIAGNOSIS — C50919 Malignant neoplasm of unspecified site of unspecified female breast: Secondary | ICD-10-CM

## 2023-03-15 DIAGNOSIS — C73 Malignant neoplasm of thyroid gland: Secondary | ICD-10-CM

## 2023-03-15 DIAGNOSIS — F32A Depression: Secondary | ICD-10-CM

## 2023-03-15 DIAGNOSIS — K219 Gastro-esophageal reflux disease without esophagitis: Secondary | ICD-10-CM

## 2023-03-15 DIAGNOSIS — Z923 Personal history of irradiation: Secondary | ICD-10-CM

## 2023-03-15 DIAGNOSIS — F3289 Other specified depressive episodes: Secondary | ICD-10-CM

## 2023-03-15 DIAGNOSIS — R519 Generalized headaches: Secondary | ICD-10-CM

## 2023-03-15 DIAGNOSIS — M5030 Other cervical disc degeneration, unspecified cervical region: Secondary | ICD-10-CM

## 2023-03-15 DIAGNOSIS — R32 Unspecified urinary incontinence: Secondary | ICD-10-CM

## 2023-03-15 DIAGNOSIS — M51369 Degenerative disc disease, lumbar: Secondary | ICD-10-CM

## 2023-03-15 DIAGNOSIS — E890 Postprocedural hypothyroidism: Secondary | ICD-10-CM

## 2023-03-15 DIAGNOSIS — E079 Disorder of thyroid, unspecified: Secondary | ICD-10-CM

## 2023-03-15 DIAGNOSIS — G629 Polyneuropathy, unspecified: Secondary | ICD-10-CM

## 2023-03-15 DIAGNOSIS — IMO0002 Ulcer: Secondary | ICD-10-CM

## 2023-03-15 DIAGNOSIS — Z789 Other specified health status: Secondary | ICD-10-CM

## 2023-03-15 DIAGNOSIS — M4800 Spinal stenosis, site unspecified: Secondary | ICD-10-CM

## 2023-03-15 NOTE — Progress Notes
Date of Service: 03/15/2023  [facility] Cardio-Oncology Clinic Visit  Oncology: Dr. Joni Reining  Cindy Randolph is a 62 y.o. female.       HPI       Cindy Randolph returns for a follow-up cardio-oncology clinic evaluation.  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  She received ddAC through November 2022 with Taxol recommendation deferred by patient. radiation with Dr. Bosie Helper completed in February 2023.  She had trouble tolerating aromatase inhibition    I saw her initially in March 2022 for exercise intolerance that I thought was possibly due to deconditioning and there was no evidence of LV dysfunction valvular disease and low likelihood of ischemia.  She had no evidence of ischemia on a treadmill test and I recommended she try to improve her exercise capacity to conditioning program descending coronary artery treating her for angina.    Cindy Randolph returns today reporting she has had a good exercise program and has improved her functional status with reduction in her exertional dyspnea was initially troubling her when I saw her..She states that she is free from exertional chest pain or pressure that limits activity.  She is having no PND, orthopnea,  edema, tachypalpitations, syncope, near syncope, or postural lightheadedness.  She has had no  focal motor defects, speech defects or cognitive defects that would suggest TIA. She is not limiting her activity in order to avoid chest discomfort or shortness of breath with exertion.    With her history of breast cancer and radiation we need to be attended to risk of coronary artery disease.  In March 06, 2022 CAC score at [facility] showed overall score of 136 with left LAD 109 and circumflex 27 left main 0.  This score I will note not extremely high player in the 80th percentile of risk for women below 60.      She was started on aspirin because of the CAC score over 100.  She was started on rosuvastatin and Zetia but stopped the rosuvastatin due to myalgias.  Now she has returned to the 10 mg rosuvastatin dose daily without Zetia.  She is aware of the change in lipid profile seen between May 9 when she was on rosuvastatin and Zetia with LDL 48 and reading of November 19, 2022 when the LDL had more than doubled at 114.     Latest Reference Range & Units 09/30/22 08:40 11/19/22 09:18   Cholesterol <200 MG/DL 401 027   Triglycerides <150 MG/DL 253 94   HDL >66 MG/DL 53 56   LDL <440 mg/dL 48 347 (H)     She admits that her dosing plan to a stop Zetia and try to rosuvastatin along with her own plan not endorsed by me when she developed this.  She is willing to makeshift based on my recommendation given the readings of the LDL that she knows are concerning.      Of note is that she has considerable reflux disease and is being seen by Te[facility] and GI and is scheduled for hiatal hernia repair by Dr. Mal Amabile surgeon at [facility] based on recommendation by Dr. Lockie Pares.       Vitals:    03/15/23 1552   BP: 122/82   BP Source: Arm, Right Upper   Pulse: 73   SpO2: 97%   O2 Device: CPAP/BiPAP   PainSc: Zero  Weight: 72.1 kg (159 lb)   Height: 160 cm (5' 3)     Body mass index is 28.17 kg/m?Marland Kitchen     Past Medical History  Patient Active Problem List    Diagnosis Date Noted    Chronic pelvic pain in female 02/10/2023    Obstructive sleep apnea 02/10/2023    Deformity of right hand 11/19/2022    Insomnia 09/29/2022    Memory changes 08/19/2022    At risk for cardiomyopathy 08/10/2022     Non metastatic ER+, HER2 Neg L breast CA. dd AC from 10/3 to 04/06/2021, 240 Mmg/M2 adria. declined taxol  Left completion ALND on 05/14/2021.   6/14 lymph nodes positive.  finished hypofractionated radiation with Dr. Haskel Khan on 07/01/21.   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       Radiation-induced pulmonary fibrosis (HCC) 06/22/2022    Recurrent major depression in full remission (HCC) 06/22/2022     Was previously on lexapro which she tolerated well.      Migraine with aura and without status migrainosus, not intractable 06/22/2022    Chronic pain of multiple joints 06/22/2022    Elevated coronary artery calcium score 06/17/2022     03/06/2022- CT CAC Score [place] : CAC= 136,  80th percentile rank for females age 3-60. Left main: 0 ,LAD: 109 ,LCX: 27 ,RCA: 0         Hiatal hernia with gastroesophageal reflux 06/17/2022     04/01/2022- Per OV Dr. Maisie Fus       Menopausal osteoporosis 07/13/2021    Excess skin of breast 01/15/2021    Malignant neoplasm of left breast in female, estrogen receptor positive (HCC) 12/22/2020     DIAGNOSIS:  Left grade 1 ILC (ER  96%, PR 10%, HER2 0, Ki-67 3%) at 12:00 dx 11/2020     HISTORY:  Cindy Randolph is a female who presented to the [facility] on 12/24/2020 at age 59 for left breast cancer.  She reports noticing Left breast intermittent discomfort over the last 2 months.  She had Screening mammogram at [facility] on 11/25/2020 which identified a focal asymmetry in the left breast.  She returned for left diagnostic mammgoram and ultrasound on 12/04/2020 which showed an ill-defined hypoechoic area at 12:00 7 cm FTN measuring 1.7 cm.   She underwent Left ultrasound guided biopsy on 12/10/2020 which revealed grade 1 hormone positive, HER2 negative ILC with associated LCIS. She has no breast complaints.  She proceeded with Bilateral total mastectomy/Left SLNB with oncoplastic flat closure on 01/22/2021.  Final surgical pathology revealed multifocal grade 1 ILC that merge to measuring 7.4 cm with associated LCIS, clear margins and 5/5 lymph nodes. The right breast is benign.   Adjuvant chemotherapy and cALND were recommended.  She completed adjuvant chemotherapy of dd AC from 10/3 to 04/06/2021, taxol was recommended but she declined.  She returned for Left completion ALND on 05/14/2021.  Final surgical pathology revealed 6/14 lymph nodes positive. She finished radiation with Dr.  Savioz on 07/01/21.    BREAST IMAGING:  Mammogram:    - Screening mammogram 11/25/2020 ([facility]) revealed scattered fibroglandular tissue density.  Benign appearing microcalcification. Unchanged right upper central anterior breast low density circumscribed masses dating back to 2013.  Focal asymmetry located within the left posterior central breast, 12:00, 7.3 cm FTN most conspicuous on MLO.  Recommend spot compression.    - Left diagnostic mammogram 12/04/2020 ([facility]) revealed irregular density at 12:00 left breast persists.  Ultrasound recommended.    Ultrasound:    Left targeted ultrasound 12/04/2020 ([facility]) revealed an ill-defined area which was slightly hypoechoic at 12:00, 7 cm FTN.  This was indeterminate and biopsy was recommended.   MRI:    - Breast MRI 12/26/2020 ([place]) The breast tissue is scattered areas of fibroglandular tissue.   There is mild background parenchymal enhancement. Left breast: There are multiple faint discontiguous irregular enhancing masses and discontiguous intervening nonmass enhancement involving predominantly the upper and inner left breast from anterior to posterior depth measuring in aggregate 6.3 cm AP by 3.6 cm transverse by 6.4 cm craniocaudal. Several areas demonstrate persistent enhancement kinetics. The artifact from tissue marker clips (ribbon and heart) at the site of biopsy demonstrating malignancy are seen within the upper left breast at the 12:00 position, middle depth (image 80). A representative discontiguous irregular enhancing mass is seen within the inner left breast at the 9:00 position, middle depth as on image 111, which demonstrates a mammographic correlate on outside CC tomosynthesis slice 18. Anterior extent of abnormal enhancement extends to the base of the left nipple. Posterior extent of abnormal enhancement is approximately 2.1 cm anterior to the underlying pectoralis muscle. There are greater than 5 small round morphologically abnormal and asymmetric level 1 left axillary lymph nodes. No suspicious level 2 or level 3 lymph nodes are seen. No suspicious internal mammary lymph nodes are seen. Right breast: No suspicious mass or nonmass enhancement is seen within the right breast. No suspicious right axillary or internal mammary lymph nodes are identified. Incidental Findings: None     REPRODUCTIVE HEALTH:  Age at first Menarche:  44  Age at First Live Birth:  38  Age at Menopause:  54  Gravida:  8  Para: 8  Breastfeeding:  yes    PROCEDURE:  Bilateral Total mastectomy/Left SLNB oncoplastic flat closure 01/22/2021 ([facility])  2. Left ALND 05/14/2021 [facility])  PATHOLOGY: multifocal grade 1 ILC that merge to measuring 7.4 cm with associated LCIS, clear margins and 5/5 lymph nodes. The right breast is benign.   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  No family history of ovarian or prostate cancer.   MEDICAL ONCOLOGY:    Dr. Neil Crouch  transferred to Dr. Donneta Romberg Adjuvant chemotherapy:  ddACx 4 completed 04/06/2021, Taxol recommended but declined;   Present therapy:  Letrozole/Verzenio  REFERRED BY:  Dr. Erskine Emery      Formatting of this note might be different from the original. Formatting of this note might be different from the original. DIAGNOSIS:  Left grade 1 ILC (ER 91-100, PR 11-20, HER 1+, Ki 67 2-5%) at 12:00 dx 11/2020 HISTORY:  Ms. Guisti is a female who presented to the [facility] on 12/24/2020 at age 53 for left breast cancer.  She reports noticing Left breast intermittent discomfort over the last 2 months.  She had Screening mammogram at [facility] on 11/25/2020 which identified a focal asymmetry in the left breast.  She  returned for left diagnostic mammgoram and ultrasound on 12/04/2020 which showed an ill-defined hypoechoic area at 12:00 7 cm FTN measuring 1.7 cm.   She underwent Left ultrasound guided biopsy on 12/10/2020 which revealed grade 1 hormone positive, HER2 negative ILC with associated LCIS. She has no breast complaints. BREAST IMAGING: Mammogram:   - Screening mammogram 11/25/2020 ([facility]) revealed scattered fibroglandular tissue density.  Benign appearing microcalcification. Unchanged right upper central anterior breast low density circumscribed masses dating back to 2013.  Focal asymmetry located within the left posterior central breast, 12:00, 7.3 cm FTN most conspicuous on MLO.  Recommend spot compression.   - Left diagnostic mammogram 12/04/2020 ([facility]) revealed irregular density at 12:00 left breast persists.  Ultrasound recommended.  Ultrasound:  Left targeted ultrasound 12/04/2020 ([facility]) revealed an ill-defined area which was slightly hypoechoic at 12:00, 7 cm FTN.  This was indeterminate and biopsy was recommended. MRI:   - Breast MRI 12/26/2020 ([place]) .... REPRODUCTIVE HEALTH: Age at first Menarche:  81 Age at First Live Birth:  89 Age at Menopause:  30 Gravida:  8 Para: 8 Breastfeeding:  yes PROCEDURE:  pending PERTINENT PMH:  Thyroid Cancer (1980- thyroidectomy), HTN FAMILY HISTORY:  Mother- Breast cancer (65).  No family history of ovarian or prostate cancer. PHYSICAL EXAM on PRESENTATION:   MEDICAL ONCOLOGY:    Dr. Neil Crouch REFERRED BY:  Dr. Erskine Emery      Hypermobility syndrome 05/24/2014    Degenerative disc disease, lumbar 02/21/2014    Primary hypertension 11/13/2007     Hypertension with pregnancy initially but sustained after 8th pregnancy.            Review of Systems   Cardiovascular:  Positive for irregular heartbeat and leg swelling.       Physical Exam  General: Patient in no distress, looks generally healthy. Skin warm and dry.    Mucous membranes moist.  Eyes: Sclera non icteric,Pupils equal and round    Carotids: no bruits    Thyroid not enlarged.  Neck veins: CVP <6 normal, no V wave, no HJR     Respiratory: Breathing comfortably. Lungs clear to percussion & auscultation. No rales, rhonchi or wheezing   Cardiac: Regular rhythm. LV impulse not palpable. Normal S1 & S2, Fourth heart sound, no rub or S3. No murmur  Abdomen: soft, non-tender, no masses,bruits,hepatic or aortic enlargement. + bowel sounds.   Femoral arteries: Good pulses, no bruits.  Legs/feet: Normal PT pulses, no edema.   Motor: Normal muscle strength. Cognitive: Pleasant demeanor. Good insight. No depression     Cardiovascular Studies   EKG shows sinus rhythm 66 With 1 APC.    The 10-year ASCVD risk score (Arnett DK, et al., 2019) is: 4.2%    Values used to calculate the score:      Age: 5 years      Sex: Female      Is Non-Hispanic African American: No      Diabetic: No      Tobacco smoker: No      Systolic Blood Pressure: 122 mmHg      Is BP treated: Yes      HDL Cholesterol: 56 MG/DL      Total Cholesterol: 187 MG/DL      Cardiovascular Health Factors  Vitals BP Readings from Last 3 Encounters:   03/15/23 122/82   03/10/23 131/89   03/08/23 118/87     Wt Readings from Last 3 Encounters:   03/15/23 72.1 kg (159 lb)   03/10/23 70.3  kg (155 lb)   03/08/23 70.3 kg (155 lb)     BMI Readings from Last 3 Encounters:   03/15/23 28.17 kg/m?   03/10/23 28.35 kg/m?   03/08/23 28.35 kg/m?      Smoking Social History     Tobacco Use   Smoking Status Never    Passive exposure: Past   Smokeless Tobacco Never   Tobacco Comments    Significant second hand smoke from parents 91-40 years old      Lipid Profile Cholesterol   Date Value Ref Range Status   11/19/2022 187 <200 MG/DL Final     HDL   Date Value Ref Range Status   11/19/2022 56 >40 MG/DL Final     LDL   Date Value Ref Range Status   11/19/2022 114 (H) <100 mg/dL Final     Triglycerides   Date Value Ref Range Status   11/19/2022 94 <150 MG/DL Final      Blood Sugar No results found for: HGBA1C  Glucose   Date Value Ref Range Status   03/04/2023 88 70 - 100 MG/DL Final   16/02/9603 98 70 - 100 MG/DL Final   54/01/8118 90 70 - 100 MG/DL Final          Problems Addressed Today  Encounter Diagnoses   Name Primary?    Elevated coronary artery calcium score Yes    Primary hypertension     Ascending aorta dilatation (HCC)        Assessment and Plan    It is good to see that since his complaints about exercise intolerance have resolved with establishing a regular conditioning program.  She is recently had upper respiratory illness that cut back on her exercise program and she is noted corresponding decline in her stamina but she is confident this will return when she feels good enough to resume her exercise program.    I have recommended that she resume Zetia 10 mg daily as this is innocuous drug and does not contribute to myalgias for more than 1 patient in thousands.  That she is back on the prior dose of rosuvastatin without myalgias.  I told her that she might at some point developed myalgias but she should attribute these to the rosuvastatin pause for a few days and cut the dose back to 5 mg daily and continue.  She needs to advise [place] if this happens.  Her LDL reduction below 50 is very satisfactory result and should minimize her risk of progressive coronary disease associated with her  coronary artery calcification score that puts her in the 80th percentile risk for women her age.    I recommended another lipid profile in mid November before the holidays to reflect effect of Zetia added to the previously resumed rosuvastatin.    Her weight is satisfactory without major adverse effect on her health.    Her blood pressures are consistently satisfactory.    She asked me about taking calcium supplements I told her this is outside my scope of practice but minor only comment about calcium supplements is if she does take them she should take the 80s and relatively low doses with food and not taking extremely high once daily dose that could lead to a potentially deleterious surge in her circulating calcium level and possibly to coronary artery calcification.  I told her this 3 has not been proven but is a reasonable consideration that merits her weight excessive once a day calcium supplements.    As  long as everything goes well and her exercise capacity remains satisfactory I do not think she needs to be seen again for a year in cardio oncology clinic.    40 minutes were spent  today in the care of this patient and completion of this encounter.  The time was spent reviewing records,  interviewing patient, doing exam, developing diagnosis, creating treatment plan written in patient oriented  terminology  for the AVS, explaining it to the patient and entering further information in the EMR.     NB: The free text in this document was generated through Dragon(TM) software with editing and proofreading  done by the author of this document by me. Some errors may persist. If there are questions about content in this document please contact me  Marissa Nestle MD  The written information I provided the patient at the conclusion of today's encounter is as follows:    Patient Instructions   Overall things look good Cindy Randolph.  I am glad that when you started to just exercise for fitness without worried about hurting your heart things went better.  You you know without me telling you it is that the older you get the longer it takes to get in shape and the faster you get out of shape when you stop.  You just need to be patient with yourself resume activity when you get knocked down by a cold or something else and be is steadily on the path to improvement.    You had terrific results with Crestor/rosuvastatin 10 and Zetia 10 every day.  The Zetia is basically innocuous.  The muscle aches come from the statin.  When he resumed the Crestor and the muscle aches did not come back that makes me wonder whether the muscle aches were actually from something else.  If the muscle aches do resume give yourself a few days off and then resume the Crestor at 5 mg a day rather than 10.  The Zetia doubles the power of the Crestor so stay with the Zetia.  I have really never been convinced that Zetia harms anyone but some people swear it does.  I think for the most part you are safe with the Zetia at 10 and you will find that either Crestor 10 or Crestor 5 is tolerable.  Either way get a lipid profile after 90 days on the combination.  I hope the LDL is back down toward 48 but it should be far under 100.    Your weight looks good your blood pressures are always terrific.  Your determination to stay exercising is good.  I am glad that we did not find something wrong on the treadmill that we needed to do something about.  I think your arteries are wide open they just are calcified and working on closing.  We need to get your cholesterol down so the progression to severe blockage slows down or stops.    I do not make recommendations on the calcium.  Bone density is something that Dr. Donneta Romberg and your primary care focus on.  I can just tell you that we are a lot more attentive to vitamin D and calcium and now he is seeing far fewer women bend over like it?  Then we used to because we are doing these things.    There is a theory that  oral calcium could be contributing to coronary calcium avoid but it is less likely if people taking supplements take lower doses more times in a  day and taking a huge dose all at once which gives a huge spike in the circulating calcium level for a while.    Resume the Zetia.  Zetia kicks in faster than the Crestor.  Wanted to get a another lipid profile near the end of November.  I really prefer to check cholesterol profile before Thanksgiving and then not again until after the Super Bowl.  See what your cholesterol looks like in mid November hopefully will be in the range of 50 and be far below 100 .    My Chart is the best way to communicate with [place] but if you prefer, call in if you have problems or questions.   Marissa Nestle, MD            Current Medications (including today's revisions)   acetaminophen (TYLENOL) 325 mg tablet Take two tablets by mouth every 6 hours. Take scheduled for 3 days after surgery, then as needed. Do not exceed 4,000mg  in a 24 hour period.    amLODIPine (NORVASC) 5 mg tablet Take one tablet by mouth daily.    aspirin 81 mg chewable tablet Chew one tablet by mouth daily.    biotin 1 mg cap Take one capsule by mouth daily.    CALCIUM PO Take 600 mg by mouth daily.    CHOLEcalciferoL (vitamin D3) 1,000 units tablet Take one tablet by mouth daily. Takes 5000 units    clobetasoL (TEMOVATE) 0.05 % topical ointment Apply  topically to affected area twice daily. Apply to areas of rash on hands twice daily; do NOT use on face/groin/underarms, use up to 2 weeks per month    diclofenac sodium (VOLTAREN) 1 % topical gel Apply four g topically to affected area daily as needed.    ezetimibe (ZETIA) 10 mg tablet Take one tablet by mouth daily.    Fish,Bora,Flax Oils-OM3,6,9 #1 (TRIPLE OMEGA) 400-400-400 mg capsule Take one capsule by mouth daily.    metoprolol succinate XL (TOPROL XL) 25 mg extended release tablet Take one tablet by mouth at bedtime daily.    Miscellaneous Medical Supply misc DME requirements, rx to include the following typed information:    NPI: [id number]  Doctor's typed name: Assunta Curtis, MD  ICD 10: G47.33  Length of need: 99 months  Start date:02/14/2023  Signature date: 02/14/2023  Detailed equipment: CPAP machine with any accessories.    naltrexone 1 mg oral capsule (BATCHED COMPOUND) Take 1 capsule by mouth daily for 1 week, THEN take 2 capsules by mouth daily x 1 week, THEN take 3 capsules daily x 1 week, THEN take 4 capsules daily thereafter.    omeprazole DR (PRILOSEC) 40 mg capsule Take one capsule by mouth daily.    other medication K Complete K1 & K2 as MK-4 & MK-7 : Take 1 softgel by mouth once daily    rosuvastatin (CRESTOR) 10 mg tablet Take one tablet by mouth daily.    Selenium 100 mcg tab Take one tablet by mouth daily.    SYNTHROID 100 mcg tablet Take one tablet by mouth daily 30 minutes before breakfast.    tiZANidine (ZANAFLEX) 2 mg tablet TAKE 1/2 TO 1 TABLET BY MOUTH THREE TIMES DAILY AS NEEDED    vitamins, B complex tab Take one tablet by mouth daily.    vitamins, multiple cap Take one capsule by mouth daily.    zinc sulfate 220 mg (50 mg elemental zinc) capsule Take one capsule by mouth daily.

## 2023-03-15 NOTE — Patient Instructions
Overall things look good Cindy Randolph.  I am glad that when you started to just exercise for fitness without worried about hurting your heart things went better.  You you know without me telling you it is that the older you get the longer it takes to get in shape and the faster you get out of shape when you stop.  You just need to be patient with yourself resume activity when you get knocked down by a cold or something else and be is steadily on the path to improvement.    You had terrific results with Crestor/rosuvastatin 10 and Zetia 10 every day.  The Zetia is basically innocuous.  The muscle aches come from the statin.  When he resumed the Crestor and the muscle aches did not come back that makes me wonder whether the muscle aches were actually from something else.  If the muscle aches do resume give yourself a few days off and then resume the Crestor at 5 mg a day rather than 10.  The Zetia doubles the power of the Crestor so stay with the Zetia.  I have really never been convinced that Zetia harms anyone but some people swear it does.  I think for the most part you are safe with the Zetia at 10 and you will find that either Crestor 10 or Crestor 5 is tolerable.  Either way get a lipid profile after 90 days on the combination.  I hope the LDL is back down toward 48 but it should be far under 100.    Your weight looks good your blood pressures are always terrific.  Your determination to stay exercising is good.  I am glad that we did not find something wrong on the treadmill that we needed to do something about.  I think your arteries are wide open they just are calcified and working on closing.  We need to get your cholesterol down so the progression to severe blockage slows down or stops.    I do not make recommendations on the calcium.  Bone density is something that Dr. Donneta Romberg and your primary care focus on.  I can just tell you that we are a lot more attentive to vitamin D and calcium and now he is seeing far fewer women bend over like it?  Then we used to because we are doing these things.    There is a theory that  oral calcium could be contributing to coronary calcium avoid but it is less likely if people taking supplements take lower doses more times in a day and taking a huge dose all at once which gives a huge spike in the circulating calcium level for a while.    Resume the Zetia.  Zetia kicks in faster than the Crestor.  Wanted to get a another lipid profile near the end of November.  I really prefer to check cholesterol profile before Thanksgiving and then not again until after the Super Bowl.  See what your cholesterol looks like in mid November hopefully will be in the range of 50 and be far below 100 .    My Chart is the best way to communicate with [place] but if you prefer, call in if you have problems or questions.   Cindy Nestle, MD

## 2023-03-21 ENCOUNTER — Encounter: Admit: 2023-03-21 | Discharge: 2023-03-21 | Payer: MEDICARE

## 2023-03-21 ENCOUNTER — Ambulatory Visit: Admit: 2023-03-21 | Discharge: 2023-03-22 | Payer: MEDICARE

## 2023-03-21 ENCOUNTER — Ambulatory Visit: Admit: 2023-03-21 | Discharge: 2023-03-21 | Payer: MEDICARE

## 2023-03-22 ENCOUNTER — Encounter: Admit: 2023-03-22 | Discharge: 2023-03-22 | Payer: MEDICARE

## 2023-03-22 ENCOUNTER — Ambulatory Visit: Admit: 2023-03-22 | Discharge: 2023-03-23 | Payer: MEDICARE

## 2023-03-22 DIAGNOSIS — M48062 Spinal stenosis, lumbar region with neurogenic claudication: Secondary | ICD-10-CM

## 2023-03-22 NOTE — Progress Notes
[facility]  Subjective     Chief Complaint:   Chief Complaint   Patient presents with    Lower Back - Pain    Left Hip - Pain    Right Hip - Pain    Neck - Pain    New Patient       HPI: The patient, with a history of back pain and scoliosis, presents with ongoing low back and hip pain. The pain occasionally radiates down the legs. The patient has previously undergone facet joint injections and radiofrequency ablation (RFA), which provided relief for a few years. More recently, the patient has had a lumbar epidural and a cervical epidural injection, both of which only provided temporary relief. The patient's pain is exacerbated by standing and walking, and she often needs to use a shopping cart for support.       PRIOR INTERVENTIONS:  Spine surgery: No  Effective  ESI - decreasing efficacy  Lumbar RFA  Lumbar facet injections    Ineffective      TREATMENT FOR CONDITION DATE  % OF RELIEF   Physical Therapy          04/02/22   How long did you do physical therapy? (in weeks)    300       ? 0 (Error. Value could not be saved.)     Chiropractic Care            How many times did you see the chiropractor?              Injection            12/23/22     What type of spine injection have you had in the past?    Burning of nerve; Epidural steroid injection; Facet injection       ? 0 (Error. Value could not be saved.)     Surgery    No         What type of spine surgery have you had in the past?                - Anticoagulants:     aspirin  ,,No         ROS: A 10-point review of systems was negative except as noted above and below.    Past Medical History:  Past Medical History:   Diagnosis Date    Accidental fall 02/23/22    Knee gave out and fell down stairs    Acquired hypothyroidism     Anxiety 2016    from pain    Back pain     Breast cancer in female [facility]) 12/2020    Left    Cancer of thyroid (HCC) 1980    Degenerative disc disease, cervical 1994    Degenerative disc disease, lumbar 2010    Degenerative disc disease, thoracic 2010    Depression situational, transient    after divorce, after death of 2nd husband    Depressive disorder, not elsewhere classified 7/23    Now on Lexapro; it is effective    Essential hypertension began in 3rd trimester most pregnancies    remained after last pregnancy    Essential hypertension, benign as above    Family history of coronary artery disease in brother 06/17/2022    04/01/2022- Per OV Dr. Maisie Fus     Fibromyalgia 2016    Diagnosed by Dr. Balinda Quails 02/2023    Generalized headaches 1996-2010    probably hormonally  related    GERD (gastroesophageal reflux disease)     H/O total thyroidectomy 05/24/1978    Heart murmur at birth    Dr. Maisie Fus said he didn't hear it a few years ago    High cholesterol     History of bilateral mastectomy 06/22/2022    History of external beam radiation therapy 07/15/2021    History of thyroid cancer 06/22/2022    TSH 0.39 and pt not taking med regularly until about 2 weeks ago.  Will have her repeat TSH in 3 weeks and f/u after.  Needs to be between 0.1 and 0.3.  Will refer her back to Endo for f/u.    Incontinence 3/23    With coughing and sneezing and strong sudden mivemwnts    Infection     Joint pain 1994    Limb alert care status     No access on R-arm    Osteoporosis 03/2021    Bilat hips    Other and unspecified hyperlipidemia 11/23    Will see cardiologist early 2024    Other malignant neoplasm without specification of site thyroid, 1980    thyroidectomy    Peripheral neuropathy 2022    Probably earlier but worsened with chemo    Seasonal allergic reaction 2004    Had rast testing done. Allergic to cow dander.    Spinal stenosis 2017 to present    per MRIs    Thyroid disorder as noted    Torn meniscus 12/25/2020    Ulcer 1987    with divorce; resolved    Unspecified deficiency anemia 2009    from excessive bleeding post-partum; treated iron       Family History:  Family History   Problem Relation Name Age of Onset    Arthritis Paternal Grandmother Ted Mcalpine         multiple heberdens nodules and deformities    Back pain Paternal Grandmother Ted Mcalpine     Arthritis-osteo Paternal Grandmother Ted Mcalpine     Diabetes Paternal Grandmother Ted Mcalpine         Type 2 as older adult    Arthritis Mother Carmina Miller         wear and tear    Back pain Mother Carmina Miller     Hypertension Mother Carmina Miller     Joint Pain Mother Carmina Miller     Neck Pain Mother Carmina Miller     Cancer-Breast Mother Carmina Miller 64        was on HRT for 10-15 years    Cancer Mother Carmina Miller         Breast CA, post menopausal, estrogen-sensitive    Miscarriages Mother Carmina Miller         3-5 and a stillbirth    Basal Cell Carcinoma Brother Onalee Hua (Brother)     Back pain Brother Onalee Hua (Brother)         Has had multiple laminectomy surgeries    Hypertension Brother Onalee Hua (Brother)     Asthma Brother Onalee Hua (Brother)         Worst when a child    Diabetes Brother Onalee Hua (Brother)         Non-insulin dependent, 2022    Basal Cell Carcinoma Brother Chip     Back pain Brother Chip         Arthritis    Hypertension Brother Chip     Diabetes Brother Chip         Non-insulin  dependent, 2023    Basal Cell Carcinoma Brother Isaias Cowman     Back pain Brother Isaias Cowman         Arthritis    Hypertension Brother Isaias Cowman     Heart problem Brother Isaias Cowman         Stent in LAD, positive apo(a)    Diabetes Brother Isaias Cowman         Non insulin dependent, 2023    Diabetes Father Alinda Dooms         Type 2    Heart problem Father Alinda Dooms         CABG 3    Heart Disease Father Alinda Dooms         Occult MI in early 58s; later found with CAD; subsequent Triple CABG. In hos 60s started amiodarone    Hypertension Father Alinda Dooms     Alcohol abuse Father Alinda Dooms     Early Death Father Alinda Dooms         Age 11, complications diabetes and heart disease    Diabetes Maternal Grandfather Elam Dutch         Type 2 as older adult    Hypertension Maternal Grandfather Elam Dutch         I remember he was on a severe salt-restricted diet    Birth Defect Daughter Rhoderick Moody         PRS, congenital diaphragmatic hernia, malrotation, grey matter heterotopia    Stroke Maternal Uncle Jillyn Hidden         In his 29s    Thyroid Disease Maternal Uncle Jillyn Hidden     Diabetes Paternal Grandfather Grandpa     Birth Defect Nephew          craniosynostosis    Thyroid Disease Maternal Uncle Bill Breiner     Cancer-Breast Maternal Great-Aunt  21    Cancer Other Karl Luke         Mother?s maternal aunt       Social History:  Lives in [place] [id number]  Social History     Socioeconomic History    Marital status: Married    Number of children: 8   Occupational History    Occupation: Retired Engineer, civil (consulting)     Comment: Worked in [employer] and [employer]; also worked in the ambulatory setting   Tobacco Use    Smoking status: Never     Passive exposure: Past    Smokeless tobacco: Never    Tobacco comments:     Significant second hand smoke from parents 39-49 years old   Vaping Use    Vaping status: Never Used   Substance and Sexual Activity    Alcohol use: Not Currently     Comment: Prior to 2020, socially, one drink once a week at most    Drug use: Never     Comment: in late teens, early twenties, a few times and not since    Sexual activity: Not Currently     Partners: Male     Birth control/protection: Post-menopausal, None       Allergies:  Allergies   Allergen Reactions    Mango ANAPHYLAXIS    Other [Unclassified Drug] ANAPHYLAXIS     DUCK Meat and Eggs     Imdur [Isosorbide Mononitrate] CHEST TIGHTNESS    Iodinated Contrast Media RASH     She developed rash on back and neck after hospitalization 06/2022 where she underwent heart cath. Unsure if contrast was cause  of rash, but it developed shortly after procedure.    Oxycodone NAUSEA ONLY     Prefers tramadol    Sudafed [Pseudoephedrine Hcl] PALPITATIONS       Medications:    Current Outpatient Medications:     acetaminophen (TYLENOL) 325 mg tablet, Take two tablets by mouth every 6 hours. Take scheduled for 3 days after surgery, then as needed. Do not exceed 4,000mg  in a 24 hour period., Disp: 40 tablet, Rfl: 0    amLODIPine (NORVASC) 5 mg tablet, Take one tablet by mouth daily., Disp: 90 tablet, Rfl: 3    aspirin 81 mg chewable tablet, Chew one tablet by mouth daily., Disp: 90 tablet, Rfl: 0    biotin 1 mg cap, Take one capsule by mouth daily., Disp: , Rfl:     CALCIUM PO, Take 600 mg by mouth daily., Disp: , Rfl:     CHOLEcalciferoL (vitamin D3) 1,000 units tablet, Take one tablet by mouth daily. Takes 5000 units, Disp: , Rfl:     clobetasoL (TEMOVATE) 0.05 % topical ointment, Apply  topically to affected area twice daily. Apply to areas of rash on hands twice daily; do NOT use on face/groin/underarms, use up to 2 weeks per month, Disp: 60 g, Rfl: 3    diclofenac sodium (VOLTAREN) 1 % topical gel, Apply four g topically to affected area daily as needed., Disp: , Rfl:     ezetimibe (ZETIA) 10 mg tablet, Take one tablet by mouth daily., Disp: 90 tablet, Rfl: 3    Fish,Bora,Flax Oils-OM3,6,9 #1 (TRIPLE OMEGA) 400-400-400 mg capsule, Take one capsule by mouth daily., Disp: , Rfl:     metoprolol succinate XL (TOPROL XL) 25 mg extended release tablet, Take one tablet by mouth at bedtime daily., Disp: 90 tablet, Rfl: 3    Miscellaneous Medical Supply misc, DME requirements, rx to include the following typed information:  NPI: [id number] Doctor's typed name: Assunta Curtis, MD ICD 10: G47.33 Length of need: 99 months Start date:02/14/2023 Signature date: 02/14/2023 Detailed equipment: CPAP machine with any accessories., Disp: 1 each, Rfl: 0    naltrexone 1 mg oral capsule (BATCHED COMPOUND), Take 1 capsule by mouth daily for 1 week, THEN take 2 capsules by mouth daily x 1 week, THEN take 3 capsules daily x 1 week, THEN take 4 capsules daily thereafter., Disp: 90 capsule, Rfl: 0    omeprazole DR (PRILOSEC) 40 mg capsule, Take one capsule by mouth daily., Disp: 90 capsule, Rfl: 1    other medication, K Complete K1 & K2 as MK-4 & MK-7 : Take 1 softgel by mouth once daily, Disp: , Rfl:     rosuvastatin (CRESTOR) 10 mg tablet, Take one tablet by mouth daily., Disp: 90 tablet, Rfl: 3    Selenium 100 mcg tab, Take one tablet by mouth daily., Disp: , Rfl:     SYNTHROID 100 mcg tablet, Take one tablet by mouth daily 30 minutes before breakfast., Disp: 90 tablet, Rfl: 3    tiZANidine (ZANAFLEX) 2 mg tablet, TAKE 1/2 TO 1 TABLET BY MOUTH THREE TIMES DAILY AS NEEDED, Disp: , Rfl:     vitamins, B complex tab, Take one tablet by mouth daily., Disp: , Rfl:     vitamins, multiple cap, Take one capsule by mouth daily., Disp: , Rfl:     zinc sulfate 220 mg (50 mg elemental zinc) capsule, Take one capsule by mouth daily., Disp: , Rfl:     Oswestry Total Score:: 54  Total: 0 Total  Score (03/22/2023  8:15 AM)  Opioid Risk Level: Low (03/22/2023  8:15 AM)      Physical examination:   BP 137/86  - Pulse 63  - Ht 160 cm (5' 3)  - Wt 72.7 kg (160 lb 3.2 oz)  - SpO2 100%  - BMI 28.38 kg/m?   Pain Score: Five    General: Alert, cooperative, no acute distress  HEENT: Normocephalic, atraumatic  Neck: Supple  Lungs: Unlabored respirations  Heart: Regular rate  Skin: Warm and dry to touch  Abdomen: Nondistended  Neurological: Alert and oriented x3.    Lumbar spine:  Appearance: No lesions or deformity  Lumbar tenderness: Yes  Pain with extension: Yes  Pain with lateral flexion: Bilateral  Sensation to light touch: Intact and equal in the bilateral lower extremities  Strength: 5/5 bilaterally in the flexors and extensors of the bilateral lower extremities  Straight leg raise: Negative bilaterally  Positive shopping cart sign    MRI C-SPINE WO CONTRAST    Narrative  MRI cervical spine    HISTORY:    Cervical stenosis of spine. Neck pain with radicular symptoms into arms left greater than right, last MRI cervical spine in 2020. History of breast cancer.    TECHNIQUE:    Multiplanar, multisequence MRI images were obtained through the cervical spine without contrast.    FINDINGS:    Comparison is made with plain films of the cervical spine from 05/06/2022 and a previous MRI of the cervical spine from 08/10/2018.    Images through the lower portion of the brain demonstrate a partially empty sellar configuration with a thin volume pituitary. There is also a punctate hypointensity on T1, T2, and STIR imaging within the left cerebellum (image 3 of series 3). Cerebellar tonsils are normal in position. The foramen magnum is widely patent.    The cervical vertebral body heights are maintained. There is smooth reversal of the cervical lordosis centered at the C4-5 level. There is trace degenerative retrolisthesis at C4-5, C5-6, and C6-7 where there is also marked loss of disc height.    There are multilevel degenerative endplate signal changes. There is no aggressive or destructive geographic marrow replacing lesion.    There is no paraspinal mass    The cervical cord and upper thoracic cord is normal in signal and caliber.    The level by level description is as follows:    C2-C3: No spinal or foraminal stenosis    C3-C4: No spinal or foraminal stenosis    C4-C5: Loss of disc height. Posterior disc osteophyte complex and uncovertebral joint enlargement. Moderate central spinal stenosis. Mild bilateral foraminal stenosis    C5-6: Posterior disc osteophyte complex and uncovertebral joint enlargement. Moderate to marked central spinal stenosis. Marked right and mild left foraminal stenosis    C6-7: Posterior disc osteophyte complex and uncovertebral joint enlargement. Moderate central spinal stenosis. Marked right and moderate left foraminal stenosis    C7-T1: Posterior disc osteophyte complex. Uncovertebral joint enlargement. Mild spinal stenosis. Marked right foraminal stenosis.    Impression  1. Chronic cervical spondylosis with marked multilevel disc degeneration greatest at C4-5, C5-6, and C6-7.  2. Multilevel central spinal stenosis, greatest at C5-6 where it is moderate to marked  3. Multilevel foraminal stenosis, greater on the right where there are multiple levels of marked narrowing  4. No abnormal cord signal  5. No evidence of cervical metastatic disease.  6. Punctate left cerebellar hypointensity on all pulse sequences. This is nonspecific and could represent  calcification, a tiny area of prior hemorrhage, or a tiny cavernoma.      Finalized by Marily Memos, M.D. on 06/05/2022 11:25 AM. Dictated by Marily Memos, M.D. on 06/05/2022 11:09 AM.    MRI L-SPINE WO CONTRAST    Narrative  MRI lumbar spine    HISTORY:    Lumbar radiculopathy    TECHNIQUE:    Multiplanar, multisequence MRI images were obtained through the lumbar spine without contrast.    FINDINGS:    Comparison is made with plain films from 08/02/2022 and a previous CT of the abdomen and pelvis from 02/10/2021.    For the purposes of this dictation, the most inferior well-formed disc space is considered L5-S1.    There is marked L3-L4 disc degeneration and grade 1 anterolisthesis at this level. The vertebral body heights are maintained. There is slight retrolisthesis of L4 on L5. There is mild left convex lumbar curvature within the lateral apex at the L3 level.    There are degenerative marrow endplate signal changes greatest at L3-L4. There are no aggressive or destructive geographic marrow replacing lesions.    There is no paraspinal mass    The conus extends to the L1 level. The lower thoracic cord and conus appear unremarkable.    The level by level description is as follows:    T12-L1: Mild facet arthropathy. No significant spinal or foraminal stenosis.    L1-L2: Mild facet arthropathy. Minimal disc bulge. No significant spinal or foraminal stenosis.    L2-L3: Mild disc bulge. Facet arthropathy and ligamentum flavum thickening mild right lateral recess and right foraminal stenosis.    L3-L4: Grade 1 anterolisthesis with marked loss of disc height and signal. Uncovered posterior disc bulge. Marked facet arthropathy and ligamentum flavum thickening. Severe spinal stenosis. Severe lateral recess stenosis and right foraminal stenosis. Moderate left lateral recess stenosis    L4-L5: Slight retrolisthesis. Posterior disc bulge. Facet arthropathy and ligamentum flavum thickening. Moderate central spinal stenosis. Moderate to severe bilateral foraminal stenosis.    L5-S1: Mild posterior disc osteophyte complex and facet arthropathy. No significant central spinal stenosis. Mild caudal bilateral foraminal stenosis.    Impression  1. Mild left convex lumbar curvature. Grade 1 anterolisthesis at L3-4 and slight retrolisthesis at L4-5-2.  2. At L3-L4, disc degeneration, anterolisthesis, and facet arthropathy with ligamentum flavum thickening results in severe spinal stenosis, lateral recess stenosis, and right foraminal stenosis. Moderate left foraminal stenosis.  3. At L4-L5 there is moderate central spinal stenosis and moderate to severe bilateral foraminal stenosis as discussed  4. Mild right lateral recess and right foraminal stenosis at L2-3. Mild bilateral foraminal stenosis at L5-S1.      Finalized by Marily Memos, M.D. on 12/23/2022 10:26 AM. Dictated by Marily Memos, M.D. on 12/23/2022 10:01 AM.    Last Cr and LFT's:  Creatinine   Date Value Ref Range Status   03/04/2023 0.93 0.4 - 1.00 MG/DL Final     AST (SGOT)   Date Value Ref Range Status   03/04/2023 25 7 - 40 U/L Final     ALT (SGPT)   Date Value Ref Range Status   03/04/2023 24 7 - 56 U/L Final     Alk Phosphatase   Date Value Ref Range Status   03/04/2023 46 25 - 110 U/L Final     Total Bilirubin   Date Value Ref Range Status   03/04/2023 0.4 0.2 - 1.3 MG/DL Final          Assessment:  Cindy Randolph is a 62 y.o. female who presents for evaluation of pain. Based on history, physical exam, and imaging, the pain complaints are most likely due to:    1. Lumbar stenosis with neurogenic claudication  MINIMALLY INVASIVE LUMBAR DECOMPRESSION (MILD)          She has had an adequate trial of rest, exercise, multimodal treatment, and the passage of time without improvement of symptoms. The pain has a significant impact on ability to perform ADLs and quality of life.    Plan:  Lumbar Spinal Stenosis  Severe stenosis at L3,4 with symptoms of neurogenic claudication. Failed conservative treatments including physical therapy, medications, and epidural injections. Has seen Dr. Jean Rosenthal but is not interested in larger scale spine operation at this time.  -Plan for L3-4 mild procedure to decompress the area and alleviate symptoms.    Hip Pain  New onset, to be addressed by Dr. Noralyn Pick.  -Continue with Dr. Randol Kern plan for hip injections.    Cervical Pain  Previous cervical cortisone injection provided relief for a few months.  -Consider future cervical cortisone injection if symptoms recur.    General Health Maintenance  History of frequent cortisone injections in multiple locations.  -Monitor for potential overuse of cortisone injections.     Risks/benefits of all pharmacologic and interventional treatments discussed and questions answered.

## 2023-03-22 NOTE — Patient Instructions
You have been scheduled by [facility], to help decrease the spinal stenosis by by removing part of the bone enough to open up the spinal canal to decrease pain and help you stand longer and walk farther. We ask that you arrive 30 minutes before the procedure to the [facility] check-in desk.      Pre-procedure Instructions  Shower thoroughly with an antibacterial soap, hibiclens the day before and the morning of the procedure and do your best to scrub your back as well.  Do not eat anything after midnight and don?t drink anything for 6 hours prior to your scheduled procedure.  You will receive an IV antibiotic and a mild sedation; therefore you must have a driver.   If you have had a recent infection/fever please notify is prior day of procedure.    Discharge Instructions    There should be minimal drainage and no swelling or redness at the injection sites. You should not experience a severe headache. You should not run a fever over 101 degrees F. If any of these occur, please call to report this occurrence to a nurse.  You may experience soreness at the injection site. Ice can be applied at 20 minute intervals. Avoid application of direct heat, hot showers or hot tubs today.  Avoid strenuous activity today. You may resume your regular activities and exercise tomorrow.  Patients taking a daily blood thinner can resume their regular dose next day.  It is important that you take all medications ordered by your pain physician. Taking medication as ordered is an important part of your pain care plan.  If you cannot continue the medication plan, please notify the physician.  If you are getting sedation for your procedure you will need a driver to take you home.  If you are taking a blood thinner talk to the nurse about when to stop it.      Understanding Minimally Invasive Lumbar Diskectomy  Lumbar diskectomy is a type of surgery to fix a disk in the lower back. Minimally invasive surgery uses 2 or more small cuts (incisions) instead of 1 large cut. This may lead to less pain after surgery. It can also lead to faster recovery.   Parts of the spine  Your backbone (spinal column) is made up of a chain of bones called the vertebrae. Your spinal cord runs through the spinal column. The bones help protect the cord from injury. Disks sit between each vertebra. The disks give cushioning and support. Large nerves called nerve roots lead from the spinal cord through small holes between the bones. These holes are called foramen. These nerve roots send and receive signals to and from the body. The signals are sent to and from your brain through the spinal cord.   The outer wall of 1 of these disks may dry out and weaken with age or injury. When this happens, the soft, inner part of the disk bulges out. This is called a herniated or bulging disk. This bulging disk can press on the spinal cord or the nerve roots. This can cause symptoms such as pain, tingling, or weakness in a nearby part of the body.   Why minimally invasive lumbar diskectomy is done  You may need this surgery if you have a herniated disk in your lower back that is causing symptoms. The symptoms may include weakness, pain, or tingling in the back area and in one of your legs.   Lumbar diskectomy can?t be used to treat all cases   of back pain. And not everyone with a herniated disk needs this surgery. Your doctor might advise the surgery if you have tried other treatments but still have severe symptoms. Other treatments to try first include physical therapy and anti-inflammatory medicines.   How minimally invasive lumbar diskectomy is done  The surgery is done by a neurosurgeon or an orthopedic surgeon and a trained medical team. The surgeon will use a special type of X-ray to view the surgery. The doctor will make a small incision on your back in the area that needs to be treated. The surgeon will put a tool called a tubular retractor into this incision. This will expose the part of the spine to be treated. The surgeon will then pass small tools through this retractor. This may include a tiny camera and a light. Your doctor will remove the herniated part of the disk using small tools. Other repairs will be done as needed.   Risks of minimally invasive lumbar diskectomy  Every surgery has risks. Risks for this surgery include:  Infection  Excess bleeding  Blood clots  Injury to nearby nerves  Reaction to anesthetic medicines  Only short-term (temporary) relief  Need for another surgery  Not everyone improves with surgery. Your risks may vary depending on your age and your general health. Talk with your doctor about the risks that most apply to you.     Having Minimally Invasive Lumbar Diskectomy  Lumbar diskectomy is a type of surgery to fix a disk in the lower back. Minimally invasive surgery uses one or more small cuts (incisions) instead of one large incision. This may lead to less pain after surgery, and faster recovery.   What to tell your healthcare provider  Tell him or her about all the medicines you take. This includes over-the-counter medicines such as ibuprofen. It also includes vitamins, herbs, and other supplements. And tell your healthcare provider if you:   Have had any recent changes in your health, such as an infection or fever  Are sensitive or allergic to any medicines, latex, tape, or anesthesia medicines (local and general)  Are pregnant or think you may be pregnant  Tests before your surgery  Before your surgery, you may need imaging tests such as MRI.  Getting ready for your surgery  Talk with your healthcare provider how to get ready for your surgery. You may need to stop taking some medicines before the procedure, such as blood thinners and aspirin. If you smoke, you may need to stop before your surgery. Smoking can delay healing. Talk with your healthcare provider if you need help to stop smoking.   Also, be sure to:  Ask a family member or friend to take you home from the hospital. You can?t drive yourself.  Follow any directions you are given for not eating or drinking before surgery.  Follow all other instructions from your healthcare provider.  You will be asked to sign a consent form that gives your permission to do the procedure. Read the form carefully. Ask questions if something is not clear.   On the day of your surgery  The surgery is done by an orthopedic surgeon or neurosurgeon and a trained medical team. There are several choices for the surgery. Your surgeon can help explain the details of your procedure. It may take about an hour. Here is an example of what you might expect:   You may have a type of anesthesia that numbs part of your body. You?ll also be   given sedation. This will make you relaxed but awake during surgery. Or you may be given general anesthesia. This prevents pain and makes you sleep through the surgery.  A healthcare provider will carefully watch your vital signs during the surgery. These include your heart rate and blood pressure.  During the procedure, the surgeon will use X-rays to view the lumbar spine.  The surgeon will make a small incision on your back in the area of the affected disk. He or she will put a tubular retractor into this incision. This will expose the part of the spine to be treated.  The surgeon will then pass small tools through this retractor. This may include a tiny camera and a light.  The surgeon will use the small tools to remove ligaments, possibly bone, and the herniated part of the disk. He or she will make other repairs as needed.  When the repairs are done, the surgeon will remove the tools and retractor. He or she will close the incision with stitches, glue, or staples. A small bandage is put on the wound.  After your surgery            You will likely go home the same day. You will need to stay for a couple of hours after the procedure. Make sure you have someone who can drive you home.   Recovering at home  The procedure may cause slightly more pain for a while, but you can take pain medicines to relieve the pain. Usually this goes away quickly. Your pain should become less than it was before your surgery.   Your doctor will give you instructions about how you can use your back. You may need to limit lifting or bending. You may need to wear a back brace for a limited time after the procedure. Most people can go back to work within a week or so.   Follow-up care  Make sure to follow all your doctor?s instructions and keep your follow-up appointments. You may need physical therapy (PT) after surgery to help strengthen your back.   When to call your healthcare provider  Call your healthcare provider right away if you have any of these:  Fever of 100.4?F (38.0?C) or higher, or as advised by your provider  A large amount of fluid leaking from the incision  Symptoms that don?t get better  Pain that is getting worse  New symptoms  [employer] last reviewed this educational content on 09/21/2020

## 2023-04-04 ENCOUNTER — Encounter: Admit: 2023-04-04 | Discharge: 2023-04-04 | Payer: MEDICARE

## 2023-04-08 ENCOUNTER — Ambulatory Visit: Admit: 2023-04-08 | Discharge: 2023-04-09 | Payer: MEDICARE

## 2023-04-08 ENCOUNTER — Ambulatory Visit: Admit: 2023-04-08 | Discharge: 2023-04-08 | Payer: MEDICARE

## 2023-04-08 ENCOUNTER — Encounter: Admit: 2023-04-08 | Discharge: 2023-04-08 | Payer: MEDICARE

## 2023-04-08 DIAGNOSIS — M48062 Spinal stenosis, lumbar region with neurogenic claudication: Secondary | ICD-10-CM

## 2023-04-08 MED ORDER — LIDOCAINE (PF) 10 MG/ML (1 %) IJ SOLN
10 mL | Freq: Once | INTRAMUSCULAR | 0 refills | Status: AC
Start: 2023-04-08 — End: ?

## 2023-04-08 MED ORDER — CEFAZOLIN INJ 1GM IVP
2 g | Freq: Once | INTRAVENOUS | 0 refills | Status: CP
Start: 2023-04-08 — End: ?

## 2023-04-08 MED ORDER — IOHEXOL 300 MG IODINE/ML IV SOLN
1 mL | Freq: Once | 0 refills | Status: AC
Start: 2023-04-08 — End: ?

## 2023-04-08 MED ORDER — FENTANYL CITRATE (PF) 50 MCG/ML IJ SOLN
50-100 ug | INTRAVENOUS | 0 refills | Status: AC | PRN
Start: 2023-04-08 — End: ?

## 2023-04-08 MED ORDER — MIDAZOLAM 1 MG/ML IJ SOLN
1-2 mg | INTRAVENOUS | 0 refills | Status: AC | PRN
Start: 2023-04-08 — End: ?

## 2023-04-11 ENCOUNTER — Encounter: Admit: 2023-04-11 | Discharge: 2023-04-11 | Payer: MEDICARE

## 2023-04-14 ENCOUNTER — Encounter: Admit: 2023-04-14 | Discharge: 2023-04-14 | Payer: MEDICARE

## 2023-04-19 ENCOUNTER — Encounter: Admit: 2023-04-19 | Discharge: 2023-04-19 | Payer: MEDICARE

## 2023-04-25 ENCOUNTER — Encounter: Admit: 2023-04-25 | Discharge: 2023-04-25 | Payer: MEDICARE

## 2023-04-26 ENCOUNTER — Encounter: Admit: 2023-04-26 | Discharge: 2023-04-26 | Payer: MEDICARE

## 2023-04-27 NOTE — Patient Instructions
 Thank you for coming to see [place] today.   Please call our office or send a message through MyChart if you have any questions or concerns.                                                                                                                 [facility]    [facility]  [address] [place], [place] [address]      [facility]  [address]  [place], [place] [address]      Dr. Joni Reining  [phone] (Nurses- Camie Kane K[place] and [place])  [phone] (Fax)      [email or web address]

## 2023-05-02 ENCOUNTER — Encounter: Admit: 2023-05-02 | Discharge: 2023-05-02 | Payer: MEDICARE

## 2023-05-02 DIAGNOSIS — C50112 Malignant neoplasm of central portion of left female breast: Secondary | ICD-10-CM

## 2023-05-02 DIAGNOSIS — Z9189 Other specified personal risk factors, not elsewhere classified: Secondary | ICD-10-CM

## 2023-05-02 DIAGNOSIS — Z79811 Long term (current) use of aromatase inhibitors: Secondary | ICD-10-CM

## 2023-05-02 DIAGNOSIS — M810 Age-related osteoporosis without current pathological fracture: Secondary | ICD-10-CM

## 2023-05-02 NOTE — Progress Notes
Name: Cindy Randolph          MRN: [id number]      DOB: 12-27-1960      AGE: 62 y.o.   DATE OF SERVICE: 05/02/2023    Subjective:             Reason for Visit:  Follow Up      Cindy Randolph is a 62 y.o. female.      Cancer Staging   Malignant neoplasm of left breast in female, estrogen receptor positive (HCC)  Staging form: Breast, AJCC 8th Edition  - Clinical stage from 12/22/2020: Stage IA (cT1c, cN0, cM0, G1, ER+, PR+, HER2-) - Signed by Massie Kluver, MD on 12/22/2020  - Pathologic stage from 02/02/2021: Stage IIIA (pT3, pN3, cM0, G1, ER+, PR+, HER2-) - Signed by Massie Kluver, MD on 05/27/2021      History of Present Illness  Cindy Randolph is a 62 y.o. woman with a diagnosis of left hormone positive breast cancer in July 2022.    She presented for a routine mammogram in July 2022 which showed benign-appearing microcalcifications and an unchanged low-density circumscribed mass in the right upper upper central breast.  There was also a focal asymmetry in the left breast.  She had a diagnostic left mammogram on 12/04/2020 which showed an irregular density at 12:00 in the left breast and on target ultrasound there was an ill-defined area with slightly hypoechoic at 12:00 7 cm from the nipple.  She underwent ultrasound-guided biopsy on 12/10/2020 which showed grade 1 invasive lobular carcinoma ER 91 to 100%, PR 11 to 20%, HER2 1+ (negative by IHC), Ki-67 2 to 5%.  She then had an MRI on 12/26/2020 which showed multiple faint masses in the left breast with non-mass enhancement extending in the upper and inner quadrant.     On 01/22/2021 she underwent bilateral mastectomy with final pathology showing left breast invasive lobular carcinoma, grade 1, multifocal with connections that merged together for a total extent of 7.4 cm, 5 out of 5 lymph nodes were positive for carcinoma.  Markers were ER 96%, PR 10%, HER2 0, Ki-67 3%.  Right breast was benign.    She had staging scans 02/10/2021 which were negative for metastatic disease.  She then received adjuvant chemotherapy with dose dense AC from 02/23/2021 until 04/06/2021.  She was scheduled to start paclitaxel on 04/20/2021 but declined due to concern for neuropathy.     She underwent left axillary lymph node dissection on 05/14/2021 with final pathology showing carcinoma in 6 of 14 lymph nodes with the largest metastatic focus measuring 1 cm.  This is for a total of 11 out of 19 lymph nodes positive for carcinoma.    She completed adjuvant PMRT on 07/01/2021 and then started letrozole.    Cindy Randolph is here for follow up. She is not on any aromatase inhibitors at this time. She feels she is enjoying life better off of them.    She is established with a urogynecologist and doing pelvic floor PT which has been helpful. Also having follow up for ongoing arthritis and low back pain and think she is seeing some improvement.               Review of Systems   Constitutional:  Negative for activity change, chills, diaphoresis, fatigue, fever and unexpected weight change.   HENT:  Negative for mouth sores, nosebleeds and sinus pressure.    Eyes:  Negative for pain and  visual disturbance.   Respiratory:  Negative for cough, shortness of breath and wheezing.    Cardiovascular:  Negative for chest pain and palpitations.   Gastrointestinal:  Negative for abdominal distention, abdominal pain, constipation, diarrhea and vomiting.   Genitourinary:  Negative for menstrual problem and vaginal bleeding.   Musculoskeletal:  Positive for arthralgias (tolerable ) and back pain (stable). Negative for myalgias and neck pain.   Skin:  Negative for rash and wound.   Neurological:  Positive for headaches (mild). Negative for dizziness, weakness, light-headedness and numbness.   Hematological:  Negative for adenopathy. Does not bruise/bleed easily.   Psychiatric/Behavioral:  Negative for sleep disturbance. The patient is not nervous/anxious.          Objective:          acetaminophen (TYLENOL) 325 mg tablet Take two tablets by mouth every 6 hours. Take scheduled for 3 days after surgery, then as needed. Do not exceed 4,000mg  in a 24 hour period.    amLODIPine (NORVASC) 5 mg tablet Take one tablet by mouth daily.    aspirin 81 mg chewable tablet Chew one tablet by mouth daily.    biotin 1 mg cap Take one capsule by mouth daily.    CALCIUM PO Take 600 mg by mouth daily.    CHOLEcalciferoL (vitamin D3) 1,000 units tablet Take one tablet by mouth daily. Takes 5000 units    clobetasoL (TEMOVATE) 0.05 % topical ointment Apply  topically to affected area twice daily. Apply to areas of rash on hands twice daily; do NOT use on face/groin/underarms, use up to 2 weeks per month    diclofenac sodium (VOLTAREN) 1 % topical gel Apply four g topically to affected area daily as needed.    ezetimibe (ZETIA) 10 mg tablet Take one tablet by mouth daily. (Patient not taking: Reported on 05/02/2023)    Fish,Bora,Flax Oils-OM3,6,9 #1 (TRIPLE OMEGA) 400-400-400 mg capsule Take one capsule by mouth daily.    HYDROcodone/acetaminophen (NORCO) 5/325 mg tablet Take one tablet by mouth every 4 hours as needed for Pain.  Max 8 tabs/day    metoprolol succinate XL (TOPROL XL) 25 mg extended release tablet Take one tablet by mouth at bedtime daily.    Miscellaneous Medical Supply misc DME requirements, rx to include the following typed information:    NPI: [id number]  Doctor's typed name: Assunta Curtis, MD  ICD 10: G47.33  Length of need: 99 months  Start date:02/14/2023  Signature date: 02/14/2023  Detailed equipment: CPAP machine with any accessories.    naltrexone 1 mg oral capsule (BATCHED COMPOUND) Take 1 capsule by mouth daily for 1 week, THEN take 2 capsules by mouth daily x 1 week, THEN take 3 capsules daily x 1 week, THEN take 4 capsules daily thereafter.    omeprazole DR (PRILOSEC) 40 mg capsule Take one capsule by mouth daily.    other medication K Complete K1 & K2 as MK-4 & MK-7 : Take 1 softgel by mouth once daily    rosuvastatin (CRESTOR) 10 mg tablet Take one tablet by mouth daily.    Selenium 100 mcg tab Take one tablet by mouth daily.    SYNTHROID 100 mcg tablet Take one tablet by mouth daily 30 minutes before breakfast.    tiZANidine (ZANAFLEX) 2 mg tablet TAKE 1/2 TO 1 TABLET BY MOUTH THREE TIMES DAILY AS NEEDED    vitamins, B complex tab Take one tablet by mouth daily.    vitamins, multiple cap Take one capsule by  mouth daily.    zinc sulfate 220 mg (50 mg elemental zinc) capsule Take one capsule by mouth daily.     Vitals:    05/02/23 1256   BP: (!) 122/92   BP Source: Arm, Right Upper   Pulse: 72   Temp: 36.7 ?C (98.1 ?F)   Resp: 16   SpO2: 97%   TempSrc: Temporal   PainSc: Five   Weight: 72.2 kg (159 lb 3.2 oz)     Body mass index is 28.2 kg/m?Marland Kitchen     Pain Score: Five  Pain Loc: Generalized (migraine and back pain)    Fatigue Scale: 3    Pain Addressed:  Current regimen working to control pain. Recent fall.    Patient Evaluated for a Clinical Trial: No treatment clinical trial available for this patient.     [place] Cooperative Oncology Group performance status is 0, Fully active, able to carry on all pre-disease performance without restriction.        Physical Exam  Constitutional:       Appearance: She is well-developed.   HENT:      Head: Normocephalic and atraumatic.   Eyes:      General: No scleral icterus.     Conjunctiva/sclera: Conjunctivae normal.   Cardiovascular:      Rate and Rhythm: Normal rate.   Pulmonary:      Effort: Pulmonary effort is normal. No respiratory distress.   Chest:   Breasts:     Right: No mass, skin change or tenderness.      Left: No mass, skin change or tenderness.       Abdominal:      Tenderness: There is no abdominal tenderness.   Musculoskeletal:         General: No tenderness.      Cervical back: Normal range of motion and neck supple.   Lymphadenopathy:      Upper Body:      Right upper body: No supraclavicular or axillary adenopathy.      Left upper body: No supraclavicular or axillary adenopathy.   Skin:     General: Skin is warm and dry.      Findings: No rash.   Neurological:      Mental Status: She is alert and oriented to person, place, and time.      Sensory: No sensory deficit.      Motor: No weakness.   Psychiatric:         Behavior: Behavior normal.         Thought Content: Thought content normal.            CBC w diff    Lab Results   Component Value Date/Time    WBC 4.2 (L) 03/04/2023 09:17 AM    RBC 4.07 03/04/2023 09:17 AM    HGB 13.3 03/04/2023 09:17 AM    HCT 38.6 03/04/2023 09:17 AM    MCV 95.0 03/04/2023 09:17 AM    MCH 32.7 03/04/2023 09:17 AM    MCHC 34.4 03/04/2023 09:17 AM    RDW 16.3 (H) 03/04/2023 09:17 AM    PLTCT 167 03/04/2023 09:17 AM    MPV 8.6 03/04/2023 09:17 AM    Lab Results   Component Value Date/Time    NEUT 63 03/04/2023 09:17 AM    ANC 2.69 03/04/2023 09:17 AM    LYMA 15 (L) 03/04/2023 09:17 AM    ALC 0.62 (L) 03/04/2023 09:17 AM    MONA 19 (H) 03/04/2023 09:17 AM  AMC 0.79 03/04/2023 09:17 AM    EOSA 2 03/04/2023 09:17 AM    AEC 0.09 03/04/2023 09:17 AM    BASA 1 03/04/2023 09:17 AM    ABC 0.03 03/04/2023 09:17 AM        Comprehensive Metabolic Profile    Lab Results   Component Value Date/Time    NA 135 (L) 03/04/2023 09:17 AM    K 3.9 03/04/2023 09:17 AM    CL 99 03/04/2023 09:17 AM    CO2 28 03/04/2023 09:17 AM    GAP 8 03/04/2023 09:17 AM    BUN 11 03/04/2023 09:17 AM    CR 0.93 03/04/2023 09:17 AM    GLU 88 03/04/2023 09:17 AM    Lab Results   Component Value Date/Time    CA 9.0 03/04/2023 09:17 AM    PO4 3.0 03/04/2023 09:17 AM    ALBUMIN 4.7 03/04/2023 09:17 AM    TOTPROT 7.5 03/04/2023 09:17 AM    ALKPHOS 46 03/04/2023 09:17 AM    AST 25 03/04/2023 09:17 AM    ALT 24 03/04/2023 09:17 AM    TOTBILI 0.4 03/04/2023 09:17 AM                 Assessment and Plan:  Cindy Randolph is a 62 y.o. postmenopausal woman with a history of Stage IIIA (pT3pN3cN0) hormone positive invasive lobular carcinoma.     She completed 4 cycles of adjuvant AC, declined Taxol. She completed radiation 07/01/21.    Long term plan included at least 5 years of endocrine therapy, with plan for extended therapy and adjuvant CDK4/6 inhibitor. She started letrozole 06/2021. She did not tolerate Letrozole (joint arthralgia). Changed to Anastrozole 01/11/22, did not tolerate due to joint arthralgia. Switched to [facility] 06/16/22. Cindy Randolph stopped her AI 09/04/22. Abemaciclib started 10/25/21. Did not tolerate 100 mg BID dose. Discontinued 12/07/21. Restarted at 50mg  BID 01/21/22. Drug Holiday started 04/06/22 due to side effected. Discontinued 05/03/22, patient requested due to intolerable side effects. She understands the risk of recurrence and role of adjuvant endocrine therapy in reducing risk and declines therapy as she feels quality of life is improved without these medications.    Since she is no longer on aromatase inhibitor can defer management of bone density to PCP and endocrinologist. Cindy Randolph experienced side effects with zometa and wishes to discontinue.    CT chest for follow up nodules: stable nodules, slight increase in right axillary lymph node immediately posterior to two surgical clips. Surgical biopsy performed 03/24/22; which was negative for malignancy.     Full scans for body pain and headaches done 12/31/2022 and 01/07/23 with no evidence of malignancy.    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    Anxiety/depression. Patient tolerating Lexapro 20 mg. Continue therapy.      RTC in 6 months.                  Joni Reining, MD  Breast Medical Oncology  Associate Professor of Internal Medicine, Division of Medical Oncology  [facility]

## 2023-05-02 NOTE — Progress Notes
Lymphedema Prevention Bioimpedance Spectroscopy (BIS) Monitoring    Notified patient via MyChart result was normal.  Provided clinic contact information for any questions or concerns.     Reviewed BIS testing results from today.  Results:  LEFT  Current: 6.7  Baseline: 2.0  Change from Baseline: 4.7    WNL less than 3 standard deviation increase from baseline.

## 2023-05-03 ENCOUNTER — Encounter: Admit: 2023-05-03 | Discharge: 2023-05-03 | Payer: MEDICARE

## 2023-05-08 ENCOUNTER — Encounter: Admit: 2023-05-08 | Discharge: 2023-05-08 | Payer: MEDICARE

## 2023-05-08 ENCOUNTER — Emergency Department: Admit: 2023-05-08 | Discharge: 2023-05-08 | Disposition: A | Discharge status: Home / Self Care | Payer: MEDICARE

## 2023-05-08 ENCOUNTER — Emergency Department: Admit: 2023-05-08 | Discharge: 2023-05-08 | Payer: MEDICARE

## 2023-05-09 ENCOUNTER — Encounter: Admit: 2023-05-09 | Discharge: 2023-05-09 | Payer: MEDICARE

## 2023-05-09 ENCOUNTER — Observation Stay: Admit: 2023-05-09 | Discharge: 2023-05-10 | Payer: MEDICARE

## 2023-05-10 ENCOUNTER — Encounter: Admit: 2023-05-10 | Discharge: 2023-05-10 | Payer: MEDICARE

## 2023-05-10 ENCOUNTER — Emergency Department: Admit: 2023-05-10 | Discharge: 2023-05-10 | Payer: MEDICARE

## 2023-05-10 MED FILL — LOPERAMIDE 2 MG PO CAP: 2 mg | ORAL | 8 days supply | Qty: 30 | Fill #1 | Status: CP

## 2023-05-10 NOTE — Telephone Encounter
ED Discharge Follow Up  Reached patient: No. Patient back in ED on 05/09/23 and currently admitted per chart.   Patient Date of Birth: 14-Mar-1961  Admission Information  Hospital Name : [facility]  ED Admission Date: 05/08/23   ED Discharge Date: 05/08/23   Admission Diagnosis: Abdominal pain  Discharge Diagnosis: Generalized abdominal pain, Diarrhea, unspecified type  Hospital Services: Unplanned  Today's call is 2 (calendar) days post discharge    Medication Reconciliation  Changes to pre-ED visit medications? Yes    START taking:  dicyclomine (BENTYL)  famotidine (PEPCID)  ondansetron (ZOFRAN ODT)    Were new prescriptions filled? N/A - did not call     Meds reviewed and reconciled? Yes, same as AVS  Current Outpatient Medications   Medication Instructions    acetaminophen (TYLENOL) 650 mg, Oral, EVERY  6 HOURS, Take scheduled for 3 days after surgery, then as needed. Do not exceed 4,000mg  in a 24 hour period.    amLODIPine (NORVASC) 5 mg, Oral, DAILY    aspirin 81 mg, Oral, DAILY    biotin 1 mg, DAILY    CALCIUM PO 600 mg, DAILY    CHOLEcalciferoL (vitamin D3) 1,000 units tablet 1 tablet, DAILY    clobetasoL (TEMOVATE) 0.05 % topical ointment Topical, TWICE DAILY, Apply to areas of rash on hands twice daily; do NOT use on face/groin/underarms, use up to 2 weeks per month    diclofenac sodium (VOLTAREN) 4 g, DAILY  PRN    dicyclomine (BENTYL) 20 mg, Oral, EVERY  6 HOURS    ezetimibe (ZETIA) 10 mg, Oral, DAILY    famotidine (PEPCID) 20 mg, Oral, TWICE DAILY    Fish,Bora,Flax Oils-OM3,6,9 #1 (TRIPLE OMEGA) 400-400-400 mg capsule 1 capsule, DAILY    HYDROcodone/acetaminophen (NORCO) 5/325 mg tablet 1 tablet, Oral, EVERY  4 HOURS PRN, Max 8 tabs/day    metoprolol succinate XL (TOPROL XL) 25 mg, Oral, AT BEDTIME DAILY    [employer] misc DME requirements, rx to include the following typed information:    NPI: [id number]  Doctor's typed name: Assunta Curtis, MD  ICD 10: G47.33  Length of need: 99 months  Start date:02/14/2023  Signature date: 02/14/2023  Detailed equipment: CPAP machine with any accessories.    naltrexone 1 mg oral capsule (BATCHED COMPOUND) Take 1 capsule by mouth daily for 1 week, THEN take 2 capsules by mouth daily x 1 week, THEN take 3 capsules daily x 1 week, THEN take 4 capsules daily thereafter.    omeprazole DR (PRILOSEC) 40 mg, Oral, DAILY    ondansetron (ZOFRAN ODT) 4 mg, Oral, EVERY  8 HOURS PRN, Place on tongue to dissolve.    other medication K Complete K1 & K2 as MK-4 & MK-7 : Take 1 softgel by mouth once daily    rosuvastatin (CRESTOR) 10 mg, Oral, DAILY    Selenium 100 mcg tab 1 tablet, DAILY    SYNTHROID 100 mcg, Oral, DAILY BEFORE BREAKFAST    tiZANidine (ZANAFLEX) 2 mg tablet TAKE 1/2 TO 1 TABLET BY MOUTH THREE TIMES DAILY AS NEEDED    vitamins, B complex tab 1 tablet, DAILY    vitamins, multiple cap 1 capsule, DAILY    zinc sulfate 220 mg, DAILY      Scheduling Follow-up Appointment  Upcoming appointments:   Future Appointments   Date Time Provider Department Center   05/12/2023  9:30 AM Sowder, Kathi Simpers, MD [facility] SPINE   05/23/2023 11:00 AM Lewayne Bunting,  Avie Echevaria, MD [facility] IM   06/03/2023  9:15 AM McAllaster, Lynita Lombard, MD [facility] Surgery   06/23/2023  9:15 AM Rudean Haskell, MD [facility] Surgery   07/25/2023 10:00 AM Drucilla Schmidt, MBBS [facility] IM   08/16/2023 11:20 AM Lewayne Bunting, Avie Echevaria, MD [facility] IM   10/31/2023 10:00 AM Floydene Flock, APRN-NP [facility] [place] Exam   11/24/2023  9:15 AM Oletta Lamas, MD [facility] OB[facility]GYN   02/09/2024 10:00 AM Felipa Emory, APRN-NP [facility]     When was patient?s last PCP visit: 02/10/2023  PCP primary location: [facility]  PCP appointment scheduled? No, routine appt on 05/23/23  Specialist appointment scheduled? Yes, with Bariatrics, Pulmonology, Oncology    Is assistance with transportation needed? No  MyChart message sent? Active in MyChart. MyChart message sent.  Artera text sent? No    ED Communication   Did patient call clinic prior to going to ED? No  Reason patient went to ED: Unable to obtain    Marthann Schiller, RN

## 2023-05-11 ENCOUNTER — Encounter: Admit: 2023-05-11 | Discharge: 2023-05-11 | Payer: MEDICARE

## 2023-05-12 ENCOUNTER — Encounter: Admit: 2023-05-12 | Discharge: 2023-05-12 | Payer: MEDICARE

## 2023-05-12 ENCOUNTER — Ambulatory Visit: Admit: 2023-05-12 | Discharge: 2023-05-12 | Payer: MEDICARE

## 2023-05-12 DIAGNOSIS — R197 Diarrhea, unspecified: Secondary | ICD-10-CM

## 2023-05-12 NOTE — Progress Notes
C.Difficile specimen was collected on 12/15- results were negative  Dr. Erline Levine ordered Zofran 4mg  q8hrs PRN on 05/08/2023

## 2023-05-16 ENCOUNTER — Encounter: Admit: 2023-05-16 | Discharge: 2023-05-16 | Payer: MEDICARE

## 2023-05-18 ENCOUNTER — Encounter: Admit: 2023-05-18 | Discharge: 2023-05-18 | Payer: MEDICARE

## 2023-05-20 ENCOUNTER — Encounter: Admit: 2023-05-20 | Discharge: 2023-05-20 | Payer: MEDICARE

## 2023-05-23 ENCOUNTER — Ambulatory Visit: Admit: 2023-05-23 | Discharge: 2023-05-23 | Payer: MEDICARE

## 2023-05-23 ENCOUNTER — Encounter: Admit: 2023-05-23 | Discharge: 2023-05-23 | Payer: MEDICARE

## 2023-05-23 DIAGNOSIS — M51369 Degeneration of intervertebral disc of lumbar region, unspecified whether pain present: Secondary | ICD-10-CM

## 2023-05-23 DIAGNOSIS — R32 Unspecified urinary incontinence: Secondary | ICD-10-CM

## 2023-05-23 DIAGNOSIS — M2550 Pain in unspecified joint: Secondary | ICD-10-CM

## 2023-05-23 DIAGNOSIS — R252 Cramp and spasm: Secondary | ICD-10-CM

## 2023-05-23 DIAGNOSIS — G43109 Migraine with aura, not intractable, without status migrainosus: Secondary | ICD-10-CM

## 2023-05-23 DIAGNOSIS — G4733 Obstructive sleep apnea (adult) (pediatric): Secondary | ICD-10-CM

## 2023-05-23 MED ORDER — SUMATRIPTAN SUCCINATE 25 MG PO TAB
ORAL_TABLET | SUBCUTANEOUS | 0 refills | 30.00000 days | Status: AC
Start: 2023-05-23 — End: ?

## 2023-05-23 MED ORDER — TRAMADOL 25 MG PO TAB
25 mg | ORAL_TABLET | ORAL | 0 refills | Status: AC | PRN
Start: 2023-05-23 — End: ?
  Filled 2023-06-16: qty 15, 5d supply, fill #1

## 2023-05-23 MED ORDER — TRAMADOL 50 MG PO TAB
50 mg | ORAL_TABLET | ORAL | 0 refills | Status: CN | PRN
Start: 2023-05-23 — End: ?

## 2023-05-23 NOTE — Patient Instructions
 Routine Clinic Information:    Make Checking in faster by using MyChart ahead of time by using the pre check in and complete the questionnaires. This will make your next visit Check in faster.      Please don't hesitate to call if you have any problems or questions.   My nurse, Shonia Skilling, RN, can be reached at [phone].  If she does not answer, please leave a voicemail as she is probably rooming other patients. Please leave your name, the spelling of your last name, date of birth, phone number they can call you back and a description of why you are calling. You may also message [place] in MyChart.    Our fax number is [phone].     Medication refills:  Please use the MyChart Refill request or contact your pharmacy directly to request medication refills.  Please allow at least 3 business days for refill requests  .  Lab work:  The main lab is on the 1st floor of the [facility].  [address], [address] [place], [place] [address]  LAB HOURS  Mon 7 a.m. - 6 p.m.  Tues 7 a.m. - 6 p.m.  Wed 7 a.m. - 6 p.m.  Thur 7 a.m. - 6 p.m.  Fri 7 a.m. - 6 p.m.  Sat 7 a.m. - noon  Sun Closed -  It is walk in only, they do not take appointments:  Other lab locations are available; please see [email or web address]    Test results:  You will receive your test results at your appointment, by MyChart (our patient portal), or via phone or letter.   We prefer to use MyChart as much as possible.  If you are expecting results and have not heard from my office within 2 weeks of your testing, please send a MyChart message or call my office.    As a part of the CARES act, starting 08/23/2019, some results will be released to mychart automatically.  With these changes you may see your results before I do.  Critical lab results will be addressed immediately, but otherwise please  give me 72 hours to view and respond to your results before reaching out with questions. Radiology is on the 2nd floor of the [address], among several other locations.  Please contact the radiology department to schedule at [phone].    Scheduling is available directly through MyChart, or via phone at [phone].  Same day and urgent care appointments are available.    We offer same day appointments for your acute health concerns. These appointments are on a first come, first serve basis. Please call [phone] if you would like to make an appointment.   Appointment reminders may be received over the phone and by text.  Communication preferences can be managed in MyChart to ensure you receive important appointment notifications.    Support for many chronic illnesses is available through [employer]: [email or web address] or [phone].    We offer Integrated Behavioral Health services, nutrition support, and pharmacist support services free of charge.  To schedule an appointment with a specialist and/or testing please call the central scheduling number at [phone].     You may see my nurse practitioner, Knute Neu, for urgent needs or if I am unavailable.  We are working as a team to provide better continuity and access to our patients.      If you ever have emergency symptoms of chest pain, shortness of breath or uncontrolled or unexplained pain, please go to  your closest emergency room.  You can give [place] an update after you have addressed any emergency.        For urgent issues after business hours/weekends/holidays call [phone] and request for the outpatient internal medicine physician to be paged.

## 2023-05-23 NOTE — Assessment & Plan Note
Likely due to dehydration and low potassium  Labs ordered  Recommended potassium rich foods

## 2023-05-23 NOTE — Assessment & Plan Note
Currently uses CPAP and is benefiting from its use  Has appointment to establish with pulmonology/sleep medicine

## 2023-05-23 NOTE — Assessment & Plan Note
Recent procedure performed on December 9th, 2024.  -Follows with outside uro-gyn (Dr Scharlene Corn), will try to obtain records  -Pelvic floor therapy referral placed per patient's request

## 2023-05-23 NOTE — Progress Notes
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 on CT  2)Update Dr. Jacqulynn Cadet on two recent procedures: MILD and BULKAMID  3)Document use of CPAP (will f/u thereafter with pulmonology with new patient appointment in Spring of 2025  4) Update re: generally worsening sciatica (I?m seeing Dr. Hal Hope 05/26/22 re: options)  5)Review that I have 06/15/23 cTIF surgery  6) Discuss paused low-dose Naltrexone because of recent procedures.  Please describe the issue(s) and history with the issue (location, severity, duration, symptoms, etc.).:  1) stomach pain, diarrhea, fever caused dehydration and 2 ER visits. Improved with IV fluids on second visit. Stayed overnight for fluids and observation. CT done per Hx CA protocol  2) MILD with Dr. Shelia Media successful resulting in decreased low back pain; however, sacroiliac pain and sciatica markedly worse. Will discuss options with Dr. Hal Hope and request a more targeted MRI, if possible. Bulkamid UROGyn procedure at [facility] with Dr. Scharlene Corn successful! No stress incontinence now.  3) Using CPAP. [employer] said just needed MD documentation within 90 days of starting CPAP. I?ll see [place] pulmonologist in March per earliest referral.  4) see #1 above  5) Just want to discuss Dr. Velta Addison thoughts re: all my recent events in relation to upcoming 1/22 cTIF procedure.  6) Low dose Naltrexone paused, knowing I?d need opioid for pain after last two procedures. With upcoming painful cTIF, and current sciatic pain will continue hold. Request refill of Tramadol.  What has been done so far to take care of the issue(s)?:  1) CT, labs, hydration, probiotic foods  2)successful MILD and BULKAMID with follow-ups completed  3)Using CPAP successfully  4) Tylenol, Ibuprofen, Tramadol and Tizanidine (pelvic floor pain persists; may need a [place] pelvic floor PT referral as the therapist I was seeing moved). Will see Dr. Hal Hope for sciatica options. Hoping for an MRI as I fear worsening disc disease and/or bone CA mets.  5) On OR schedule with Dr. Leilani Merl and Dr. Burnis Medin 1/22 for cTIF  6) Using 1/2 tab 25mg  Tramadol for pain over 6/7. Only one tab left from last Rx from September.  What are your goals for this visit?:  Dr. Jacqulynn Cadet to address above and help me think through my complex problem list and direct my primary care plan.    Cindy Randolph is a 62 y.o. female.  Tailbone Pain     Has history of left breast cancer s/p mastectomy in 2022, cervical spine radiculopathy, lumbar degenerative disease, osteoporosis, migraine, depression, OSA on CPAP and GERD with hiatal hernia.     Underwent fluoroscopy guided percutaneous bilateral direct lumbar decompression on 04/08/2023.    The patient, with a history of hiatal hernia and lumbar decompression, presents with multiple concerns. She underwent a lumbar decompression (MILD) procedure last month. She reports that the lumbar decompression was successful, but since then, she has experienced increased sacroiliac pain and sciatica. The patient is unsure if the increased pain is due to the procedure or arthritis. She plans to consult with her doctor about this issue.    The patient also underwent a procedure for stress incontinence, which she reports was successful. However, she experienced severe diarrhea for three days following the procedure, which she suspects may have been due to the use of IV cephalosporin during the procedure.  The diarrhea has since resolved.    The patient reports an increase in the frequency of migraines, from once every other year to twice a month. She has been using sumatriptan, which she finds effective. She also mentions experiencing palpitations, which occur randomly and last for a few seconds. She has had four episodes in the past week. Evaluated in the past with cardiology was unremarkable.    The patient is currently taking multiple medications including omeprazole, rosuvastatin, Zetia, tizanidine, and sumatriptan. She expresses concern about the increased use of Tylenol for pain management and the potential impact on her liver.             Objective:          acetaminophen (TYLENOL) 325 mg tablet Take two tablets by mouth every 6 hours. Take scheduled for 3 days after surgery, then as needed. Do not exceed 4,000mg  in a 24 hour period.    amLODIPine (NORVASC) 5 mg tablet Take one tablet by mouth daily.    aspirin 81 mg chewable tablet Chew one tablet by mouth daily.    biotin 1 mg cap Take one capsule by mouth daily.    calcium carbonate (OS-CAL) 1250 mg (500 mg elemental calcium) tablet Take one tablet by mouth daily.    CHOLEcalciferoL (vitamin D3) 5000 unit tablet Take one tablet by mouth daily.    clobetasoL (TEMOVATE) 0.05 % topical ointment Apply  topically to affected area twice daily. Apply to areas of rash on hands twice daily; do NOT use on face/groin/underarms, use up to 2 weeks per month    diclofenac sodium (VOLTAREN) 1 % topical gel Apply four g topically to affected area daily as needed.    ezetimibe (ZETIA) 10 mg tablet Take one tablet by mouth daily.    Fish,Bora,Flax Oils-OM3,6,9 #1 (TRIPLE OMEGA) 400-400-400 mg capsule Take one capsule by mouth at bedtime daily.    loperamide (IMODIUM A-D) 2 mg capsule Take one capsule by mouth four times daily as needed for Diarrhea. Indications: diarrhea    metoprolol succinate XL (TOPROL XL) 25 mg extended release tablet Take one tablet by mouth at bedtime daily.    [employer] misc DME requirements, rx to include the following typed information:    NPI: [id number]  Doctor's typed name: Assunta Curtis, MD  ICD 10: G47.33  Length of need: 99 months  Start date:02/14/2023  Signature date: 02/14/2023  Detailed equipment: CPAP machine with any accessories. naltrexone 1 mg oral capsule (BATCHED COMPOUND) Take 1 capsule by mouth daily for 1 week, THEN take 2 capsules by mouth daily x 1 week, THEN take 3 capsules daily x 1 week, THEN take 4 capsules daily thereafter. (Patient not taking: Reported on 05/23/2023)    omeprazole DR (PRILOSEC) 40 mg capsule Take one capsule by mouth daily.    ondansetron (ZOFRAN ODT) 4 mg rapid dissolve tablet Dissolve one tablet by mouth every 8 hours as needed for Nausea for up to 21 doses. Place on tongue to dissolve.    other medication K Complete K1 & K2 as MK-4 & MK-7 : Take 1 softgel by mouth once daily    rosuvastatin (CRESTOR) 10 mg tablet Take one tablet by mouth daily.    Selenium 100 mcg tab Take one tablet by mouth daily.    SUMAtriptan succinate (IMITREX) 25 mg tablet Take one tablet by mouth at onset of headache. May repeat after 2 hours if needed. Max of 200 mg in 24 hours.  SYNTHROID 100 mcg tablet Take one tablet by mouth daily 30 minutes before breakfast.    tiZANidine (ZANAFLEX) 2 mg tablet TAKE 1/2 TO 1 TABLET BY MOUTH THREE TIMES DAILY AS NEEDED    traMADoL 25 mg tablet Take one tablet by mouth every 8 hours as needed.    vit A/vit C/vit E/zinc/copper (PRESERVISION AREDS PO) Take 1 tablet by mouth daily.    vitamins, B complex tab Take one tablet by mouth daily.    vitamins, multiple cap Take one capsule by mouth daily.     Vitals:    05/23/23 1059   BP: 136/84   BP Source: Arm, Right Upper   Pulse: 77   Temp: 36.6 ?C (97.9 ?F)   Resp: 16   SpO2: 97%   TempSrc: Oral   PainSc: Five   Weight: 72.6 kg (160 lb 1.6 oz)   Height: 157.5 cm (5' 2)     Body mass index is 29.28 kg/m?Marland Kitchen     Physical Exam  Vitals reviewed.   HENT:      Head: Normocephalic and atraumatic.   Pulmonary:      Effort: Pulmonary effort is normal.   Skin:     General: Skin is warm and dry.   Neurological:      Mental Status: She is alert and oriented to person, place, and time.   Psychiatric:         Mood and Affect: Mood normal.         Behavior: Behavior normal.         BP Readings from Last 5 Encounters:   05/23/23 136/84   05/12/23 (!) 134/94   05/10/23 99/73   05/08/23 (!) 148/86   05/02/23 (!) 122/92        Wt Readings from Last 5 Encounters:   05/23/23 72.6 kg (160 lb 1.6 oz)   05/12/23 70.3 kg (155 lb)   05/10/23 71.3 kg (157 lb 3 oz)   05/08/23 70.3 kg (155 lb)   05/02/23 72.2 kg (159 lb 3.2 oz)        Health Maintenance   Topic Date Due    RSV VACCINE (60 YEARS AND OLDER) (1 - Risk 60-74 years 1-dose series) Never done    COVID-19 VACCINE (3 - 2024-25 season) 01/23/2023    MEDICARE ANNUAL WELLNESS VISIT  02/10/2024    CERVICAL CANCER SCREENING  10/03/2024    DTAP/TDAP VACCINES (3 - Td or Tdap) 11/21/2025    COLORECTAL CANCER SCREENING  10/15/2031    SHINGLES RECOMBINANT VACCINE  Completed    HIV SCREENING  Completed    PNEUMOCOCCAL VACCINE AGE 42 AND OVER  Completed    HEPATITIS C SCREENING  Completed    DEPRESSION SCREENING  Completed    INFLUENZA VACCINE  Completed    HPV VACCINES  Aged Out    BREAST CANCER SCREENING  Discontinued               Assessment and Plan:    Problem   Urinary Incontinence   Muscle Cramps   Obstructive Sleep Apnea   Migraine With Aura and Without Status Migrainosus, Not Intractable   Chronic Pain of Multiple Joints   Degenerative Disc Disease, Lumbar       Problem List Items Addressed This Visit          GENITOURINARY AND REPRODUCTIVE    Urinary incontinence     Recent procedure performed on December 9th, 2024.  -Follows with outside uro-gyn (Dr Scharlene Corn), will  try to obtain records  -Pelvic floor therapy referral placed per patient's request         Relevant Orders    AMB REFERRAL TO PELVIC FLOOR REHAB       MUSCULOSKELETAL AND INJURIES    Chronic pain of multiple joints - Primary     Persistent joint pain in hands, more than baseline. No other joint swelling noted.  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 Radial deviation of the second digit at the DIP joint.   Patient occasionally taking Tramadol for pain management, refilled.  -Had side effects affecting sleep with gabapentin and lyrica         Relevant Medications    traMADoL 25 mg tablet       NEURO    Degenerative disc disease, lumbar     Increased sciatica and sacroiliac pain following minimally invasive lumbar decompression. Possible post-operative swelling or arthritis contributing to pain.  -Follow-up with her spine specialist for further assessment         Migraine with aura and without status migrainosus, not intractable     Increase in frequency of migraines from once every other year to twice a month. Recent normal CT head.  -Continue Sumatriptan as needed.  -Consider neurology referral and imaging if headache characteristics change or frequency increases significantly.         Relevant Medications    SUMAtriptan succinate (IMITREX) 25 mg tablet       SLEEP    Obstructive sleep apnea     Currently uses CPAP and is benefiting from its use  Has appointment to establish with pulmonology/sleep medicine            SYMPTOMS AND SIGNS    Muscle cramps     Likely due to dehydration and low potassium  Labs ordered  Recommended potassium rich foods         Relevant Orders    CREATINE KINASE-CPK    BASIC METABOLIC PANEL    MAGNESIUM        Patient Instructions   Routine Clinic Information:    Make Checking in faster by using MyChart ahead of time by using the pre check in and complete the questionnaires. This will make your next visit Check in faster.      Please don't hesitate to call if you have any problems or questions.   My nurse, Raquel, RN, can be reached at [phone].  If she does not answer, please leave a voicemail as she is probably rooming other patients. Please leave your name, the spelling of your last name, date of birth, phone number they can call you back and a description of why you are calling. You may also message [place] in MyChart.    Our fax number is [phone]. Medication refills:  Please use the MyChart Refill request or contact your pharmacy directly to request medication refills.  Please allow at least 3 business days for refill requests  .  Lab work:  The main lab is on the 1st floor of the [facility].  [address], [address] [place], [place] [address]  LAB HOURS  Mon 7 a.m. - 6 p.m.  Tues 7 a.m. - 6 p.m.  Wed 7 a.m. - 6 p.m.  Thur 7 a.m. - 6 p.m.  Fri 7 a.m. - 6 p.m.  Sat 7 a.m. - noon  Sun Closed -  It is walk in only,  they do not take appointments:  Other lab locations are available; please see [email or web address]    Test results:  You will receive your test results at your appointment, by MyChart (our patient portal), or via phone or letter.   We prefer to use MyChart as much as possible.  If you are expecting results and have not heard from my office within 2 weeks of your testing, please send a MyChart message or call my office.    As a part of the CARES act, starting 08/23/2019, some results will be released to mychart automatically.  With these changes you may see your results before I do.  Critical lab results will be addressed immediately, but otherwise please  give me 72 hours to view and respond to your results before reaching out with questions.      Radiology is on the 2nd floor of the [address], among several other locations.  Please contact the radiology department to schedule at [phone].    Scheduling is available directly through MyChart, or via phone at [phone].  Same day and urgent care appointments are available.    We offer same day appointments for your acute health concerns. These appointments are on a first come, first serve basis. Please call [phone] if you would like to make an appointment.   Appointment reminders may be received over the phone and by text.  Communication preferences can be managed in MyChart to ensure you receive important appointment notifications.    Support for many chronic illnesses is available through [employer]: [email or web address][employer] or [phone].    We offer Integrated Behavioral Health services, nutrition support, and pharmacist support services free of charge.  To schedule an appointment with a specialist and/or testing please call the central scheduling number at [phone].     You may see my nurse practitioner, Knute Neu, for urgent needs or if I am unavailable.  We are working as a team to provide better continuity and access to our patients.      If you ever have emergency symptoms of chest pain, shortness of breath or uncontrolled or unexplained pain, please go to your closest emergency room.  You can give [place] an update after you have addressed any emergency.        For urgent issues after business hours/weekends/holidays call [phone] and request for the outpatient internal medicine physician to be paged.      Return in about 4 months (around 09/21/2023) for Follow-up [CODE 226] - 20 min visit.

## 2023-05-23 NOTE — Assessment & Plan Note
Increase in frequency of migraines from once every other year to twice a month. Recent normal CT head.  -Continue Sumatriptan as needed.  -Consider neurology referral and imaging if headache characteristics change or frequency increases significantly.

## 2023-05-23 NOTE — Assessment & Plan Note
Persistent joint pain in hands, more than baseline. No other joint swelling noted.  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 Radial deviation of the second digit at the DIP joint.   Patient occasionally taking Tramadol for pain management, refilled.  -Had side effects affecting sleep with gabapentin and lyrica

## 2023-05-23 NOTE — Assessment & Plan Note
Increased sciatica and sacroiliac pain following minimally invasive lumbar decompression. Possible post-operative swelling or arthritis contributing to pain.  -Follow-up with her spine specialist for further assessment

## 2023-05-24 ENCOUNTER — Encounter: Admit: 2023-05-24 | Discharge: 2023-05-24 | Payer: MEDICARE

## 2023-05-24 ENCOUNTER — Ambulatory Visit: Admit: 2023-05-24 | Discharge: 2023-05-25 | Payer: MEDICARE

## 2023-05-24 DIAGNOSIS — G8929 Other chronic pain: Secondary | ICD-10-CM

## 2023-05-26 ENCOUNTER — Encounter: Admit: 2023-05-26 | Discharge: 2023-05-26 | Payer: MEDICARE

## 2023-05-26 ENCOUNTER — Ambulatory Visit: Admit: 2023-05-26 | Discharge: 2023-05-27 | Payer: MEDICARE

## 2023-05-27 ENCOUNTER — Encounter: Admit: 2023-05-27 | Discharge: 2023-05-27 | Payer: MEDICARE

## 2023-05-27 ENCOUNTER — Ambulatory Visit: Admit: 2023-05-27 | Discharge: 2023-05-27 | Payer: MEDICARE

## 2023-05-27 NOTE — Progress Notes
SPINE CLINIC NOTE       SUBJECTIVE:   [name] with past medical history of breast cancer, coronary artery disease, HTN, GERD, fibromyalgia, hypothyroidism, hyperlipidemia returns for clinic for low back pain.  She was last seen in our clinic 03/21/2023, at the time her pain was mostly on the hips secondary to OA and we scheduled for for bilateral intra-articular hip injection with steroid.  She is decided to defer bilateral reticular hip injections as her pain is not significant.  On her last visit we also reviewed her most recent MRI that showed severe central canal stenosis at the L3-L4 and we referred her to Dr. Fredna Dow for a MILD procedure. The L3-L4 MILD procedure was performed on 04/08/2023 she which she obtained some improved funtctionality and movement. However she now is describing the new onset of left lumbar radicular pain.  She has an upcoming hiatal hernia repair in Jan/16. Denies loss of bowel/bladder control, recent fevers/chills, or loss of unexpected weight.      Cindy Randolph has a past medical history of Accidental fall (02/23/22) (Knee gave out and fell down stairs), Acquired hypothyroidism, Allergy, Anxiety (2016) (from pain), Aortic aneurysm (HCC) (Moderately dilated ascending aorta at 4.2 cm), Back pain, Breast cancer in female [facility]) (12/2020) (s/p surgery and radiation), Coronary artery disease (Moderate stenosis of mid LAD), Degenerative disc disease, cervical (1994), Degenerative disc disease, lumbar (2010), Degenerative disc disease, thoracic (2010), Depressive disorder, not elsewhere classified (7/23) (Now on Lexapro; it is effective), Essential hypertension (began in 3rd trimester most pregnancies) (remained after last pregnancy), Family history of coronary artery disease in brother (06/17/2022) (04/01/2022- Per OV Dr. Maisie Fus ), Fibromyalgia (2016) (Diagnosed by Dr. Balinda Quails 02/2023), GERD (gastroesophageal reflux disease), H/O total thyroidectomy (05/24/1978), Heart murmur (at birth) (Dr. Maisie Fus said he didn't hear it a few years ago), Hiatal hernia, High cholesterol, History of bilateral mastectomy (06/22/2022), History of external beam radiation therapy (07/15/2021), History of thyroid cancer (06/22/2022) (TSH 0.39 and pt not taking med regularly until about 2 weeks ago.  Will have her repeat TSH in 3 weeks and f/u after.  Needs to be between 0.1 and 0.3.  Will refer her back to Endo for f/u.), Hypothyroid (1980) (thyroidectomy), Incontinence (3/23) (With coughing and sneezing and strong sudden mivemwnts), Infection, Joint pain (1994), Limb alert care status (No access on R-arm), Migraine, Mild mitral and aortic regurgitation, Osteoporosis (03/2021) (Bilat hips), Other and unspecified hyperlipidemia (11/23) (Will see cardiologist early 2024), Peripheral neuropathy (2022) (Probably earlier but worsened with chemo), PUD (peptic ulcer disease), Seasonal allergic reaction (2004) (Had rast testing done. Allergic to cow dander.), Spinal headache (Spring, 2020) (After RFA; resolved.), Spinal stenosis (2017 to present) (per MRIs), Torn meniscus (12/25/2020), Ulcer (1987) (with divorce; resolved), and Unspecified deficiency anemia (2009) (from excessive bleeding post-partum; treated iron).    She has a past surgical history that includes arthroplasty (2001) (L ACL); knee surgery (L ACL as above); wrist fracture surgery (1993) (Baker's thumb with fixation); thyroidectomy; colonoscopy (2017) (normal); knee arthroscopy (Right, 01/08/2021) (ARTHROSCOPY KNEE WITH PARTIAL LATERAL MENISCECTOMY AND LEFT KNEE INJECTION. performed by Vopat, Lowry Ram, MD at [facility] OR); mastectomy (Bilateral, 01/22/2021) (BILATERAL TOTAL MASTECTOMIES performed by Massie Kluver, MD at [facility] OR); lymph node biopsy (Left, 01/22/2021) (LEFT AXILLARY SENTINEL LYMPH NODE BIOPSY performed by Massie Kluver, MD at [facility] OR); WOUND REPAIR (Bilateral, 01/22/2021) (BILATERAL CHEST FLAT CLOSURE performed by Stevenson Clinch, MD at [facility] OR); WOUND REPAIR (Bilateral, 01/22/2021) (BILATERAL CHEST FLAT  CLOSURE x 8 performed by de Daine Gip, MD at [facility] OR); LESION EXCISION (Right, 01/22/2021) (EXCISION BENIGN LESION 0.5 CM OR LESS - TORSO performed by de Daine Gip, MD at [facility] OR); Tunneled venous port placement (Right, 02/19/2021); lymphadenectomy (Left, 05/14/2021) (Left Completion Axillary Lymph Node Dissection performed by Massie Kluver, MD at [facility] OR); TUNNELED VENOUS PORT REMOVAL (Right, 05/14/2021) (REMOVAL TUNNELED CENTRAL VENOUS ACCESS DEVICE INCLUDING PORT/ PUMP performed by Massie Kluver, MD at [facility] OR); Colonoscopy (N/A, 10/14/2021) (COLONOSCOPY DIAGNOSTIC WITH SPECIMEN COLLECTION BY BRUSHING/ WASHING - FLEXIBLE performed by Benetta Spar, MD at [facility] OR); echocardiogram procedure; Upper gastrointestinal endoscopy (N/A, 06/17/2022) (ESOPHAGOGASTRODUODENOSCOPY WITH SPECIMEN COLLECTION BY BRUSHING/ WASHING performed by Remigio Eisenmenger, MD at [facility] ENDO); Esophagus dilation (N/A, 06/17/2022) (ESOPHAGOGASTRODUODENOSCOPY WITH DILATION ESOPHAGUS WITH BALLOON 30 MM OR GREATER - FLEXIBLE performed by Remigio Eisenmenger, MD at [facility] ENDO); fracture surgery (1993) (ORIF L 1st metacarpal); acl reconstruction (06/03/1999); carpal tunnel release (1996) (With second and other oregbnancies); Breast surgery (01/24/21) (Bilat mastectomy); lymph node dissection (12/22) (Left); Upper gastrointestinal endoscopy (N/A, 02/08/2023) (ESOPHAGOGASTRODUODENOSCOPY WITH BIOPSY - FLEXIBLE performed by Jolee Ewing, MD at [facility] ENDO); Upper gastrointestinal endoscopy (N/A, 02/08/2023) (ESOPHAGOGASTRODUODENOSCOPY WITH DILATION GASTRIC/ DUODENAL STRICTURE - FLEXIBLE performed by Jolee Ewing, MD at [facility] ENDO); esophageal motility study (N/A, 02/08/2023) (ESOPHAGEAL MOTILITY STUDY performed by Jolee Ewing, MD at [facility] ENDO); Spine surgery (03/2023) (MILD at [facility], Dr. Shelia Media); and umbilical arterial cath - bedside (February, 2024 at [facility]    Cindy Randolph's family history includes Alcohol abuse in her father; Arthritis in her mother and paternal grandmother; Arthritis-osteo in her paternal grandmother; Asthma in her brother; Back pain in her brother, brother, brother, mother, and paternal grandmother; Basal Cell Carcinoma in her brother, brother, and brother; Birth Defect in her daughter and nephew; Cancer in her brother, mother, and another family member; Physiological scientist (age of onset: 86) in her mother; Physiological scientist (age of onset: 63) in her maternal great-aunt; Diabetes in her brother, brother, brother, father, maternal grandfather, paternal grandfather, and paternal grandmother; Early Death in her father; Heart Disease in her father; Heart problem in her brother and father; Hypertension in her brother, brother, brother, father, maternal grandfather, and mother; Joint Pain in her mother; Miscarriages in her mother; Neck Pain in her mother; Stroke in her maternal uncle; Thyroid Disease in her maternal uncle and maternal uncle.          No annotated images are attached to the encounter.    Review of Systems    Current Outpatient Medications:     acetaminophen (TYLENOL) 325 mg tablet, Take two tablets by mouth every 6 hours. Take scheduled for 3 days after surgery, then as needed. Do not exceed 4,000mg  in a 24 hour period., Disp: 40 tablet, Rfl: 0    amLODIPine (NORVASC) 5 mg tablet, Take one tablet by mouth daily. (Patient taking differently: Take one tablet by mouth at bedtime daily.), Disp: 90 tablet, Rfl: 3    aspirin 81 mg chewable tablet, Chew one tablet by mouth daily., Disp: 90 tablet, Rfl: 0    biotin 1 mg cap, Take one capsule by mouth daily., Disp: , Rfl:     calcium carbonate (OS-CAL) 1250 mg (500 mg elemental calcium) tablet, Take one tablet by mouth daily., Disp: , Rfl:     CHOLEcalciferoL (vitamin D3) 5000 unit tablet, Take one tablet by mouth daily., Disp: , Rfl:     clobetasoL (TEMOVATE) 0.05 % topical ointment, Apply  topically to affected  area twice daily. Apply to areas of rash on hands twice daily; do NOT use on face/groin/underarms, use up to 2 weeks per month, Disp: 60 g, Rfl: 3    diclofenac sodium (VOLTAREN) 1 % topical gel, Apply four g topically to affected area daily as needed., Disp: , Rfl:     ezetimibe (ZETIA) 10 mg tablet, Take one tablet by mouth daily. (Patient taking differently: Take one tablet by mouth at bedtime daily.), Disp: 90 tablet, Rfl: 3    Fish,Bora,Flax Oils-OM3,6,9 #1 (TRIPLE OMEGA) 400-400-400 mg capsule, Take one capsule by mouth at bedtime daily., Disp: , Rfl:     metoprolol succinate XL (TOPROL XL) 25 mg extended release tablet, Take one tablet by mouth at bedtime daily., Disp: 90 tablet, Rfl: 3    Miscellaneous Medical Supply misc, DME requirements, rx to include the following typed information:  NPI: [id number] Doctor's typed name: Assunta Curtis, MD ICD 10: G47.33 Length of need: 99 months Start date:02/14/2023 Signature date: 02/14/2023 Detailed equipment: CPAP machine with any accessories., Disp: 1 each, Rfl: 0    naltrexone 1 mg oral capsule (BATCHED COMPOUND), Take 1 capsule by mouth daily for 1 week, THEN take 2 capsules by mouth daily x 1 week, THEN take 3 capsules daily x 1 week, THEN take 4 capsules daily thereafter., Disp: 90 capsule, Rfl: 0    omeprazole DR (PRILOSEC) 40 mg capsule, Take one capsule by mouth daily., Disp: 90 capsule, Rfl: 1    ondansetron (ZOFRAN ODT) 4 mg rapid dissolve tablet, Dissolve one tablet by mouth every 8 hours as needed for Nausea for up to 21 doses. Place on tongue to dissolve., Disp: 15 tablet, Rfl: 0    other medication, Take one Dose by mouth daily. K Complete K1 & K2 as MK-4 & MK-7 : Take 1 softgel by mouth once daily, Disp: , Rfl:     rosuvastatin (CRESTOR) 10 mg tablet, Take one tablet by mouth daily. (Patient taking differently: Take one tablet by mouth at bedtime daily.), Disp: 90 tablet, Rfl: 3    Selenium 100 mcg tab, Take one tablet by mouth daily., Disp: , Rfl:     SUMAtriptan succinate (IMITREX) 25 mg tablet, Take one tablet by mouth at onset of headache. May repeat after 2 hours if needed. Max of 200 mg in 24 hours., Disp: 9 tablet, Rfl: 0    SYNTHROID 100 mcg tablet, Take one tablet by mouth daily 30 minutes before breakfast., Disp: 90 tablet, Rfl: 3    tiZANidine (ZANAFLEX) 2 mg tablet, Take one tablet by mouth twice daily as needed., Disp: , Rfl:     traMADoL 25 mg tablet, Take one tablet by mouth every 8 hours as needed. (Patient taking differently: Take 12.5 mg by mouth every 8 hours as needed.), Disp: 30 tablet, Rfl: 0    vit A/vit C/vit E/zinc/copper (PRESERVISION AREDS PO), Take 1 tablet by mouth daily., Disp: , Rfl:     vitamins, B complex tab, Take one tablet by mouth daily., Disp: , Rfl:     vitamins, multiple cap, Take one capsule by mouth daily., Disp: , Rfl:   Allergies   Allergen Reactions    Mango ANAPHYLAXIS    Other [Unclassified Drug] ANAPHYLAXIS     DUCK Meat and Eggs     Imdur [Isosorbide Mononitrate] CHEST TIGHTNESS and RASH    Oxycodone NAUSEA ONLY     Prefers tramadol    Sudafed [Pseudoephedrine Hcl] PALPITATIONS     Physical Exam  Vitals:    05/27/23 0841   BP: 125/83   Pulse: 79   SpO2: 99%   PainSc: Five   Weight: 72.6 kg (160 lb)   Height: 157.5 cm (5' 2)     Oswestry Total Score:: (Patient-Rptd) 52  Pain Score: Five  Body mass index is 29.26 kg/m?.    Gen: Alert & Oriented X 3  HEENT: EOMI  Neck: Supple, no elevated JVP  Heart: Extremities well perfused  Lungs: non labored breathing  Abdomen: Soft, non-tender, non-distended  Skin: no gross lesions appreciated  Ext: purposeful movement of extremities       -LOWER  EXTREMITIES-  MS:   Muscle Strength Test Root Right Left   Thigh Flexion L2 5 5   Knee Extension L3 5 5   Ankle Dorsiflexion L4 4 5   Great Toe Extension L5 5 5   Plantar Flexion S1 5 5   Knee Flexion S2 5 5   Sensation: L2-S2 dermatomes intact to light touch    MSK  -Low Back-  Inspection: No sign of muscular atrophy or scoliosis  Palpation: Non-tender to palpation over spinous process, facets, para spinal musculature, or PSIS  Special maneuver:  - Straight leg raise (negative) bilateral  - Slump test  (negative) bilateral        DIAGNOSTICS  CHEST SINGLE VIEW  Narrative: CHEST SINGLE VIEW    Clinical history: chest pain    Technique: Portable AP view of the chest.    Comparison: Chest x-ray from 07/16/2022.    Findings:    Heart size is normal with stable mediastinal contours. No significant pulmonary venous congestion.    No pleural effusion, pneumothorax, or focal consolidation.    Multilevel degenerative changes in thoracic spine, greatest at the mid thoracic spine. No obvious destructive osseous lesion or acute bony abnormality appreciated.    Redemonstration of multiple bilateral surgical clips, likely from prior bilateral mastectomy and left axillary node dissection.  Impression: No acute finding.     Finalized by Sherolyn Buba, M.D. on 05/09/2023 8:42 PM. Dictated by Sherolyn Buba, M.D. on 05/09/2023 8:40 PM.          Assessment:  The pain complaints are most likely due to:  1. Left lumbar radiculitis      Cindy Randolph is a 63 y.o. female who  has a past medical history of Accidental fall (02/23/22), Acquired hypothyroidism, Allergy, Anxiety (2016), Aortic aneurysm (HCC), Back pain, Breast cancer in female [facility]) (12/2020), Coronary artery disease, Degenerative disc disease, cervical (1994), Degenerative disc disease, lumbar (2010), Degenerative disc disease, thoracic (2010), Depressive disorder, not elsewhere classified (7/23), Essential hypertension (began in 3rd trimester most pregnancies), Family history of coronary artery disease in brother (06/17/2022), Fibromyalgia (2016), GERD (gastroesophageal reflux disease), H/O total thyroidectomy (05/24/1978), Heart murmur (at birth), Hiatal hernia, High cholesterol, History of bilateral mastectomy (06/22/2022), History of external beam radiation therapy (07/15/2021), History of thyroid cancer (06/22/2022), Hypothyroid (1980), Incontinence (3/23), Infection, Joint pain (1994), Limb alert care status, Migraine, Mild mitral and aortic regurgitation, Osteoporosis (03/2021), Other and unspecified hyperlipidemia (11/23), Peripheral neuropathy (2022), PUD (peptic ulcer disease), Seasonal allergic reaction (2004), Spinal headache ([place], 2020), Spinal stenosis (2017 to present), Torn meniscus (12/25/2020), Ulcer (1987), and Unspecified deficiency anemia (2009).  [name] who presents for evaluation of low back pain and leg      Lifestyle Modifications:   -Recommend activity as tolerated.  Avoid provocative maneuvers. Keep spine in neutral position.  Therapies/HEP:  -Continue PT/HEP  as tolerated.     Interventions:   -Will schedule for left-sided L3-L4 and L4-L5 TFESI at first available appointment.   Medications:   -Continue current pharmacologic regimen.   Imaging:  -None today.          Pt was seen and discussed with Dr. Sheppard Plumber [facility] Paulette Blanch, MD  Interventional Spine and Musculoskeletal Medicine Fellow  Physical Medicine & Rehabilitation Dept.    ATTESTATION    I personally performed the key portions of the E/M visit, discussed case with fellow and concur with fellow documentation of history, physical exam, assessment, and treatment plan unless otherwise noted.    Staff name:  Lizbeth Bark, MD Date:  05/27/2023

## 2023-05-28 ENCOUNTER — Encounter: Admit: 2023-05-28 | Discharge: 2023-05-28 | Payer: MEDICARE

## 2023-05-28 DIAGNOSIS — M5416 Radiculopathy, lumbar region: Secondary | ICD-10-CM

## 2023-05-30 ENCOUNTER — Encounter: Admit: 2023-05-30 | Discharge: 2023-05-30 | Payer: MEDICARE

## 2023-05-30 DIAGNOSIS — R931 Abnormal findings on diagnostic imaging of heart and coronary circulation: Secondary | ICD-10-CM

## 2023-05-30 DIAGNOSIS — E785 Hyperlipidemia, unspecified: Secondary | ICD-10-CM

## 2023-05-30 DIAGNOSIS — R52 Pain, unspecified: Secondary | ICD-10-CM

## 2023-05-30 DIAGNOSIS — I25119 Atherosclerotic heart disease of native coronary artery with unspecified angina pectoris: Secondary | ICD-10-CM

## 2023-06-01 ENCOUNTER — Encounter: Admit: 2023-06-01 | Discharge: 2023-06-01 | Payer: MEDICARE

## 2023-06-01 ENCOUNTER — Ambulatory Visit: Admit: 2023-06-01 | Discharge: 2023-06-01 | Payer: MEDICARE

## 2023-06-01 MED ORDER — CALCIPOTRIENE 0.005 % TP CREA
TOPICAL | 1 refills | Status: AC
Start: 2023-06-01 — End: ?

## 2023-06-01 NOTE — Patient Instructions
Thank you for choosing [facility]. I am happy that you are allowing me to participate in your care.     A summary of the recommendations made at today's visit is below:  Shave Biopsy Site Care    - Please keep bandage on for 24 hours  - You may remove bandage after 24 hours and clean with gentle soap (i.e. Dove, Cetaphil) and water  - Keep covered with vaseline and a bandage until healed, approximately 2-3 weeks  - Please call us if you develop swelling, pain, redness at biopsy site       General recommendations  Applying creams/ointments  If you were prescribed a topical medication, a small amount will suffice. A line of cream/ointment placed on your fingertip to the first joint (where the fingertip bends) should be sufficient to cover a surface area of the size of the palm of your hand. You do not need a thick layer the prescription cream/ointment.     Your rash may return after application of the prescription topical medicine. Repeat the course of treatment as discussed in your visit with me. Please let me know if you don't get enough relief, the rash spreads, or if new or worsening symptoms develop.    Gentle skin care for everyday  - Bathe with lukewarm water, 5-10 minutes. Dry thoroughly afterward by patting yourself with a towel. Avoid heavy rubbing  - Use a mild soap, such as Dove, Cetaphil, or any fragrance-free product  - Apply a moisturizer of your choice as much as you can (there's no limit like in prescription topical medications), but at least 2 times a day (even if no bath is taken) and after every bath.  After the bath, pat dry and apply the moisturizer right away. Use a thick moisturizer that says cream or ointment (not lotion that comes in a pump bottle - this is not as moisturizing, but does work). The stickier the better (like Vaseline or Aquaphor)! However, find one that you like and will use.  - For your face, you may use a gentle cleanser (such as CeraVe or Cetaphil just as examples) or plain water, followed by a non-comedogenic (meaning it won't clog pores) moisturizer with sunscreen SPF 30 or higher. A mineral sunscreen with zinc, titanium, or iron oxide are preferable      Sun protection is KEY to success in taking care of your skin!  Using sunscreens and sun avoidance will decrease your risk of developing skin cancers like melanoma, as well as prevent developing signs of aging like wrinkles, redness, and brown/dark spots.  - Avoid the sun at peak hours of the day (10 AM - 4 PM)   - Wear sunscreen SPF 30 or above. It should be reapplied every 2 hours if possible, or when you wipe off with a towel after swimming  - Use sunprotective clothing (shirts with long sleeves, pants, and wide-brimmed cowboy style hat). Baseball caps don't work well enough!    ** FOR MOISTURIZERS AND SUNSCREENS: I RECOMMEND ANY PRODUCT THAT YOU LIKE AND WILL USE CONSISTENTLY -- ANY BRAND/PRICE IS ACCEPTABLE **    Moles and other brown spots   - Common melanocytic nevi, or moles, tend to be less than 6 mm in diameter (size of a pencil eraser) and symmetric with even pigmentation, round or oval shape, regular outline, and sharp, non-fuzzy border. They can stick out from the skin.  - Flaky, seemingly stuck-on brown bumps are usually benign/non-cancerous keratoses, not moles. You will likely  get more of these with time.  - Bright red smooth bumps that do not bleed are usually benign blood vessel lesions (cherry angiomas), not moles.  - It is normal to get new moles until the age of 41-66 years old.    Please contact me if you notice the following:  - A mole that differs from others (an ugly duckling that has an odd shape, uneven or uncertain border, different colors), or one that changes, bleeds, or itches.  - Any growth that has one or more of the following features:  A - Asymmetry. (Concerning if spot is not symmetric--one half is unlike the other half)  B - Border. (Irregular, notched, or poorly defined border are concerning)  C - Color. (Multiple colors [shades of tan, brown or black, or is sometimes white, red, or blue] or changes in color are concerning)  D - Diameter. (Larger than 6mm, i.e., a pencil eraser)  E - Evolution. (An evolving or changing spot, which looks different from others on your body. If there's a new itch, tenderness, or bleeding develop, these are concerning changes too.  - An uneven, new, or large (greater than 3mm) brown or black streak underneath a fingernail or toenail, which may or may not spread onto the skin next to the nail  - A sore that just won't heal after about 4 weeks    Cost of prescriptions  We do everything that we can to get your prescriptions approved through your insurance company. Unfortunately, our efforts may not always work. I understand this is frustrating. Some options to help make your prescription medications more affordable include GoodRx (https://www.goodrx.com/) and [facility] (https://costplusdrugs.com/medications/categories/hair-&-skin-health/). Please let me know if your prescription is too expensive at your regular pharmacy and we can try one of those options. Even if it is covered by your insurance, your pharmacy may not communicate with [place].          Contact info  Routine - Please don't hesitate to let me know if you have additional questions or concerns before your next visit. I recommend directing your inquiries through MyChart. However, if you do not have MyChart access or prefer calling, the phone number is [phone], Option 4.   Urgent - For urgent, after-hours/weekend issues that are not emergencies, please call the [facility] Operator at [phone]. Ask to have paged the dermatologist on call. You will receive a call back from the dermatologist.  Emergency - Please call 911 or go to the nearest Emergency Department for all medical emergencies.         Zada Finders, MD MSc FAAD  Assistant Professor - Dermatology  Department of Internal Medicine  [facility]

## 2023-06-02 ENCOUNTER — Encounter: Admit: 2023-06-02 | Discharge: 2023-06-02 | Payer: MEDICARE

## 2023-06-02 DIAGNOSIS — D489 Neoplasm of uncertain behavior, unspecified: Secondary | ICD-10-CM

## 2023-06-02 DIAGNOSIS — L570 Actinic keratosis: Secondary | ICD-10-CM

## 2023-06-03 ENCOUNTER — Ambulatory Visit: Admit: 2023-06-03 | Discharge: 2023-06-03 | Payer: MEDICARE

## 2023-06-03 ENCOUNTER — Encounter: Admit: 2023-06-03 | Discharge: 2023-06-03 | Payer: MEDICARE

## 2023-06-03 DIAGNOSIS — K449 Diaphragmatic hernia without obstruction or gangrene: Secondary | ICD-10-CM

## 2023-06-03 NOTE — Progress Notes
Department of Metabolic, Bariatric, and Minimally Invasive Surgery         Reason for Visit:  Evaluate for potential antireflux surgery    HPI:  Cindy Randolph is a 63 y.o. female who presents with hiatal hernia and GERD.  Referred by Dr. Burnis Medin for cTIF evaluation.  Had recent procedure for spinal stenosis as well as urogyn procedure- has recovered well aside from GI side effects/diarrhea after procedures.        Current evaluation and work up has included:  96hr pH testing:  DeMeester score 23.5, SAP >95% heartburn  EGD: 2cm hiatal hernia, LA Grade B esophagitis  Esophagram:  small hiatal hernia  Manometry:          PMH:  Past Medical History:    Accidental fall    Acquired hypothyroidism    Allergy    Anxiety    Aortic aneurysm (HCC)    Back pain    Breast cancer in female [facility])    Coronary artery disease    Degenerative disc disease, cervical    Degenerative disc disease, lumbar    Degenerative disc disease, thoracic    Depressive disorder, not elsewhere classified    Essential hypertension    Family history of coronary artery disease in brother    Fibromyalgia    GERD (gastroesophageal reflux disease)    H/O total thyroidectomy    Heart murmur    Hiatal hernia    High cholesterol    History of bilateral mastectomy    History of external beam radiation therapy    History of thyroid cancer    Hypothyroid    Incontinence    Infection    Joint pain    Limb alert care status    Migraine    Mild mitral and aortic regurgitation    Osteoporosis    Other and unspecified hyperlipidemia    Peripheral neuropathy    PUD (peptic ulcer disease)    Seasonal allergic reaction    Spinal headache    Spinal stenosis    Torn meniscus    Ulcer    Unspecified deficiency anemia       PSH:  Surgical History:   Procedure Laterality Date    HX WRIST FRACTURE SURGERY  1993    Baker's thumb with fixation    ARTHROPLASTY  2001    L ACL    COLONOSCOPY  2017    normal    ARTHROSCOPY KNEE WITH PARTIAL LATERAL MENISCECTOMY AND LEFT KNEE INJECTION. Right 01/08/2021    Performed by Tanja Port, MD at [facility] OR    BILATERAL TOTAL MASTECTOMIES Bilateral 01/22/2021    Performed by Massie Kluver, MD at [facility] OR    INTRAOPERATIVE SENTINEL LYMPH NODE IDENTIFICATION WITH/ WITHOUT NON-RADIOACTIVE DYE INJECTION Left 01/22/2021    Performed by Massie Kluver, MD at [facility] OR    INJECTION RADIOACTIVE TRACER FOR SENTINEL NODE IDENTIFICATION Left 01/22/2021    Performed by Massie Kluver, MD at [facility] OR    LEFT AXILLARY SENTINEL LYMPH NODE BIOPSY Left 01/22/2021    Performed by Massie Kluver, MD at [facility] OR    BILATERAL CHEST FLAT CLOSURE Bilateral 01/22/2021    Performed by Stevenson Clinch, MD at [facility] OR    BILATERAL CHEST FLAT CLOSURE x 8 Bilateral 01/22/2021    Performed by Stevenson Clinch, MD at [facility] OR    EXCISION BENIGN LESION 0.5 CM OR LESS - TORSO Right 01/22/2021  Performed by Stevenson Clinch, MD at [facility] OR    TUNNELED VENOUS PORT PLACEMENT Right 02/19/2021    Placement of port-a-cath - 59F Right 02/19/2021    Performed by Freund, Alecia Lemming., MD at [facility] OR    FLUOROSCOPIC GUIDANCE CENTRAL VENOUS ACCESS DEVICE PLACEMENT/ REPLACEMENT/ REMOVAL N/A 02/19/2021    Performed by Flo Shanks Alecia Lemming., MD at [facility] OR    Left Completion Axillary Lymph Node Dissection Left 05/14/2021    Performed by Massie Kluver, MD at [facility] OR    REMOVAL TUNNELED CENTRAL VENOUS ACCESS DEVICE INCLUDING PORT/ PUMP Right 05/14/2021    Performed by Massie Kluver, MD at [facility] OR    COLONOSCOPY DIAGNOSTIC WITH SPECIMEN COLLECTION BY BRUSHING/ WASHING - FLEXIBLE N/A 10/14/2021    Performed by Benetta Spar, MD at [facility] OR    ESOPHAGOGASTRODUODENOSCOPY WITH SPECIMEN COLLECTION BY BRUSHING/ WASHING N/A 06/17/2022    Performed by Remigio Eisenmenger, MD at [facility] ENDO    ESOPHAGOGASTRODUODENOSCOPY WITH DILATION ESOPHAGUS WITH BALLOON 30 MM OR GREATER - FLEXIBLE N/A 06/17/2022    Performed by Remigio Eisenmenger, MD at [facility] ENDO    ANGIOGRAPHY CORONARY ARTERY WITH LEFT HEART CATHETERIZATION N/A 07/16/2022    Performed by Harley Alto, MD at [facility] CATH LAB    PERCUTANEOUS CORONARY STENT PLACEMENT WITH ANGIOPLASTY N/A 07/16/2022    Performed by Harley Alto, MD at [facility] CATH LAB    ESOPHAGEAL MOTILITY STUDY N/A 02/08/2023    Performed by Jolee Ewing, MD at [facility] ENDO    ESOPHAGOGASTRODUODENOSCOPY WITH BIOPSY - FLEXIBLE N/A 02/08/2023    Performed by Jolee Ewing, MD at [facility] ENDO    GASTROESOPHAGEAL REFLUX TEST WITH MUCOSAL ATTACHED TELEMETRY PH ELECTE N/A 02/08/2023    Performed by Jolee Ewing, MD at [facility] ENDO    ESOPHAGOGASTRODUODENOSCOPY WITH DILATION GASTRIC/ DUODENAL STRICTURE - FLEXIBLE N/A 02/08/2023    Performed by Jolee Ewing, MD at [facility] ENDO    ACL RECONSTRUCTION  06/03/1999    BREAST SURGERY  01/24/21    Bilat mastectomy    ECHOCARDIOGRAM PROCEDURE      FRACTURE SURGERY  1993    ORIF L 1st metacarpal    HX CARPAL TUNNEL RELEASE  1996    With second and other oregbnancies    KNEE SURGERY  L ACL as above    LYMPH NODE DISSECTION  12/22    Left    SPINE SURGERY  03/2023    MILD at , Dr. Shelia Media    THYROIDECTOMY      UMBILICAL ARTERIAL CATH - BEDSIDE  February, 2024 at [facility]         Current Medications:    Current Outpatient Medications:     acetaminophen (TYLENOL) 325 mg tablet, Take two tablets by mouth every 6 hours. Take scheduled for 3 days after surgery, then as needed. Do not exceed 4,000mg  in a 24 hour period., Disp: 40 tablet, Rfl: 0    amLODIPine (NORVASC) 5 mg tablet, Take one tablet by mouth daily. (Patient taking differently: Take one tablet by mouth at bedtime daily.), Disp: 90 tablet, Rfl: 3    aspirin 81 mg chewable tablet, Chew one tablet by mouth daily., Disp: 90 tablet, Rfl: 0    biotin 1 mg cap, Take one capsule by mouth daily., Disp: , Rfl:     calcipotriene (DOVONEX) 0.005 % topical cream, Apply twice daily to forehead and temple for 4 days., Disp: 60 g, Rfl: 1    calcium  carbonate (OS-CAL) 1250 mg (500 mg elemental calcium) tablet, Take one tablet by mouth daily., Disp: , Rfl:     CHOLEcalciferoL (vitamin D3) 5000 unit tablet, Take one tablet by mouth daily., Disp: , Rfl:     clobetasoL (TEMOVATE) 0.05 % topical ointment, Apply  topically to affected area twice daily. Apply to areas of rash on hands twice daily; do NOT use on face/groin/underarms, use up to 2 weeks per month, Disp: 60 g, Rfl: 3    diclofenac sodium (VOLTAREN) 1 % topical gel, Apply four g topically to affected area daily as needed., Disp: , Rfl:     ezetimibe (ZETIA) 10 mg tablet, Take one tablet by mouth daily. (Patient taking differently: Take one tablet by mouth at bedtime daily.), Disp: 90 tablet, Rfl: 3    Fish,Bora,Flax Oils-OM3,6,9 #1 (TRIPLE OMEGA) 400-400-400 mg capsule, Take one capsule by mouth at bedtime daily., Disp: , Rfl:     metoprolol succinate XL (TOPROL XL) 25 mg extended release tablet, Take one tablet by mouth at bedtime daily., Disp: 90 tablet, Rfl: 3    Miscellaneous Medical Supply misc, DME requirements, rx to include the following typed information:  NPI: [id number] Doctor's typed name: Assunta Curtis, MD ICD 10: G47.33 Length of need: 99 months Start date:02/14/2023 Signature date: 02/14/2023 Detailed equipment: CPAP machine with any accessories., Disp: 1 each, Rfl: 0    naltrexone 1 mg oral capsule (BATCHED COMPOUND), Take 1 capsule by mouth daily for 1 week, THEN take 2 capsules by mouth daily x 1 week, THEN take 3 capsules daily x 1 week, THEN take 4 capsules daily thereafter., Disp: 90 capsule, Rfl: 0    omeprazole DR (PRILOSEC) 40 mg capsule, Take one capsule by mouth daily., Disp: 90 capsule, Rfl: 1    ondansetron (ZOFRAN ODT) 4 mg rapid dissolve tablet, Dissolve one tablet by mouth every 8 hours as needed for Nausea for up to 21 doses. Place on tongue to dissolve., Disp: 15 tablet, Rfl: 0    other medication, Take one Dose by mouth daily. K Complete K1 & K2 as MK-4 & MK-7 : Take 1 softgel by mouth once daily, Disp: , Rfl:     rosuvastatin (CRESTOR) 10 mg tablet, Take one tablet by mouth daily. (Patient taking differently: Take one tablet by mouth at bedtime daily.), Disp: 90 tablet, Rfl: 3    Selenium 100 mcg tab, Take one tablet by mouth daily., Disp: , Rfl:     SUMAtriptan succinate (IMITREX) 25 mg tablet, Take one tablet by mouth at onset of headache. May repeat after 2 hours if needed. Max of 200 mg in 24 hours., Disp: 9 tablet, Rfl: 0    SYNTHROID 100 mcg tablet, Take one tablet by mouth daily 30 minutes before breakfast., Disp: 90 tablet, Rfl: 3    tiZANidine (ZANAFLEX) 2 mg tablet, Take one tablet by mouth twice daily as needed., Disp: , Rfl:     traMADoL 25 mg tablet, Take one tablet by mouth every 8 hours as needed. (Patient taking differently: Take 12.5 mg by mouth every 8 hours as needed.), Disp: 30 tablet, Rfl: 0    vit A/vit C/vit E/zinc/copper (PRESERVISION AREDS PO), Take 1 tablet by mouth daily., Disp: , Rfl:     vitamins, B complex tab, Take one tablet by mouth daily., Disp: , Rfl:     vitamins, multiple cap, Take one capsule by mouth daily., Disp: , Rfl:     Allergies:  Allergies   Allergen Reactions  Mango ANAPHYLAXIS    Other [Unclassified Drug] ANAPHYLAXIS     DUCK Meat and Eggs     Imdur [Isosorbide Mononitrate] CHEST TIGHTNESS and RASH    Oxycodone NAUSEA ONLY     Prefers tramadol    Sudafed [Pseudoephedrine Hcl] PALPITATIONS       SHx:  Social History     Tobacco Use    Smoking status: Never     Passive exposure: Past    Smokeless tobacco: Never    Tobacco comments:     Significant second hand smoke from parents 30-26 years old   Vaping Use    Vaping status: Never Used   Substance Use Topics    Alcohol use: Not Currently     Comment: Prior to 2020, socially, one drink once a week at most    Drug use: Never     Comment: in late teens, early twenties, a few times and not since       FHx:  Family History   Problem Relation Name Age of Onset    Arthritis Paternal Grandmother Ted Mcalpine         multiple heberdens nodules and deformities    Back pain Paternal Grandmother Ted Mcalpine     Arthritis-osteo Paternal Grandmother Ted Mcalpine     Diabetes Paternal Grandmother Ted Mcalpine         Type 2 as older adult    Arthritis Mother Carmina Miller         wear and tear    Back pain Mother Carmina Miller     Hypertension Mother Carmina Miller     Joint Pain Mother Carmina Miller     Neck Pain Mother Carmina Miller     Cancer-Breast Mother Carmina Miller 64        was on HRT for 10-15 years    Cancer Mother Carmina Miller         Breast CA, post menopausal, estrogen-sensitive    Miscarriages Mother Carmina Miller         3-5 and a stillbirth    Basal Cell Carcinoma Brother Onalee Hua (Brother)     Back pain Brother Onalee Hua (Brother)         Has had multiple laminectomy surgeries    Hypertension Brother Onalee Hua (Brother)     Asthma Brother Onalee Hua (Brother)         Worst when a child    Diabetes Brother Onalee Hua (Brother)         Non-insulin dependent, 2022    Basal Cell Carcinoma Brother Chip     Back pain Brother Chip         Arthritis    Hypertension Brother Chip     Diabetes Brother Chip         Non-insulin dependent, 2023    Basal Cell Carcinoma Brother Isaias Cowman     Back pain Brother Isaias Cowman         Arthritis    Hypertension Brother Isaias Cowman     Heart problem Brother Isaias Cowman         Stent in LAD, positive apo(a)    Diabetes Brother Isaias Cowman         Non insulin dependent, 2023    Diabetes Father Alinda Dooms         Type 2    Heart problem Father Alinda Dooms         CABG 3    Heart Disease Father Alinda Dooms         Occult  MI in early 23s; later found with CAD; subsequent Triple CABG. In hos 60s started amiodarone    Hypertension Father Alinda Dooms     Alcohol abuse Father Alinda Dooms     Early Death Father Alinda Dooms         Age 36, complications diabetes and heart disease    Diabetes Maternal Grandfather Elam Dutch         Type 2 as older adult    Hypertension Maternal Grandfather Elam Dutch         I remember he was on a severe salt-restricted diet Birth Defect Daughter Rhoderick Moody         PRS, congenital diaphragmatic hernia, malrotation, grey matter heterotopia    Stroke Maternal Uncle Jillyn Hidden         In his 41s    Thyroid Disease Maternal Uncle Jillyn Hidden     Diabetes Paternal Grandfather Grandpa     Birth Defect Nephew          craniosynostosis    Thyroid Disease Maternal Uncle Bill Breiner     Cancer-Breast Maternal Great-Aunt  70    Cancer Other Karl Luke         Mother?s maternal aunt    Cancer Brother Onalee Hua (Brother)         Prostate, Stage 2       ROS:  Review of Systems    PE:  Vitals:    06/03/23 0854   BP: (!) 144/89   Pulse: 88   Temp: 36.6 ?C (97.9 ?F)   SpO2: 99%   PainSc: Four   Weight: 73.2 kg (161 lb 6.4 oz)   Height: 157.5 cm (5' 2)         73.2 kg (161 lb 6.4 oz)  Body mass index is 29.52 kg/m?Marland Kitchen  GENERAL:  Alert and oriented x 3, not in acute distress  HEENT:  EOMI, no scleral icterus  NECK: Supple, no lymphadenopathy, no bruit  CHEST:  ctab  HEART: Regular rate and rhythm  ABDOMEN:  Soft, non-tender, non-distended, no hernias appreciated  EXTREMITIES:  No cyanosis or clubbing   NEURO: CN II-XII grossly intact, no focal deficits      Lab/Radiology/Other Diagnostic Tests:  Hemoglobin   Date Value Ref Range Status   05/10/2023 12.0 12.0 - 15.0 g/dL Final   09/60/4540 98.1 12.0 - 15.0 GM/DL Final     Hematocrit   Date Value Ref Range Status   05/10/2023 35.1 (L) 36.0 - 45.0 % Final   03/04/2023 38.6 36 - 45 % Final     Platelet Count   Date Value Ref Range Status   05/10/2023 185 150 - 400 10*3/uL Final   03/04/2023 167 150 - 400 K/UL Final     Neutrophils   Date Value Ref Range Status   05/09/2023 40 (L) 41 - 77 % Final   03/04/2023 63 41 - 77 % Final     Absolute Neutrophil Count   Date Value Ref Range Status   05/09/2023 1.70 (L) 1.80 - 7.00 10*3/uL Final   03/04/2023 2.69 1.8 - 7.0 K/UL Final     Lymphocytes   Date Value Ref Range Status   04/20/2021 11 (L) 24 - 44 % Final     Absolute Lymph Count   Date Value Ref Range Status 05/09/2023 1.40 1.00 - 4.80 10*3/uL Final   03/04/2023 0.62 (L) 1.0 - 4.8 K/UL Final     Monocytes   Date Value Ref Range  Status   05/09/2023 21 (H) 4 - 12 % Final   03/04/2023 19 (H) 4 - 12 % Final     Absolute Monocyte Count   Date Value Ref Range Status   05/09/2023 0.90 (H) 0.00 - 0.80 10*3/uL Final   03/04/2023 0.79 0 - 0.80 K/UL Final     Eosinophils   Date Value Ref Range Status   05/09/2023 4 0 - 5 % Final   03/04/2023 2 0 - 5 % Final     Absolute Basophil Count   Date Value Ref Range Status   05/09/2023 0.00 0.00 - 0.20 10*3/uL Final   03/04/2023 0.03 0 - 0.20 K/UL Final     Basophil   Date Value Ref Range Status   04/20/2021 2 0 - 2 % Final     MCV   Date Value Ref Range Status   05/10/2023 94.5 80.0 - 100.0 fL Final   03/04/2023 95.0 80 - 100 FL Final     MCH   Date Value Ref Range Status   05/10/2023 32.4 26.0 - 34.0 pg Final   03/04/2023 32.7 26 - 34 PG Final     MCHC   Date Value Ref Range Status   05/10/2023 34.3 32.0 - 36.0 g/dL Final   40/98/1191 47.8 32.0 - 36.0 G/DL Final     MPV   Date Value Ref Range Status   05/10/2023 7.6 7.0 - 11.0 fL Final   03/04/2023 8.6 7 - 11 FL Final     RDW   Date Value Ref Range Status   05/10/2023 13.4 11.0 - 15.0 % Final   03/04/2023 16.3 (H) 11 - 15 % Final     Sodium   Date Value Ref Range Status   05/23/2023 137 137 - 147 mmol/L Final   03/04/2023 135 (L) 137 - 147 MMOL/L Final     Potassium   Date Value Ref Range Status   05/23/2023 3.9 3.5 - 5.1 mmol/L Final   03/04/2023 3.9 3.5 - 5.1 MMOL/L Final     Chloride   Date Value Ref Range Status   05/23/2023 99 98 - 110 mmol/L Final   03/04/2023 99 98 - 110 MMOL/L Final     CO2   Date Value Ref Range Status   05/23/2023 29 21 - 30 mmol/L Final   03/04/2023 28 21 - 30 MMOL/L Final     Anion Gap   Date Value Ref Range Status   05/23/2023 9 3 - 12 Final   03/04/2023 8 3 - 12 Final     Blood Urea Nitrogen   Date Value Ref Range Status   05/23/2023 11 7 - 25 mg/dL Final   29/56/2130 11 7 - 25 MG/DL Final     Creatinine Date Value Ref Range Status   05/23/2023 0.76 0.40 - 1.00 mg/dL Final   86/57/8469 6.29 0.4 - 1.00 MG/DL Final     Glucose   Date Value Ref Range Status   05/23/2023 122 (H) 70 - 100 mg/dL Final   52/84/1324 88 70 - 100 MG/DL Final     Calcium   Date Value Ref Range Status   05/23/2023 9.8 8.5 - 10.6 mg/dL Final   40/02/2724 9.0 8.5 - 10.6 MG/DL Final     Albumin   Date Value Ref Range Status   05/10/2023 3.9 3.5 - 5.0 g/dL Final   36/64/4034 4.7 3.5 - 5.0 G/DL Final     Magnesium   Date Value Ref Range  Status   05/23/2023 2.0 1.6 - 2.6 mg/dL Final   16/02/9603 2.2 1.6 - 2.6 mg/dL Final     Total Bilirubin   Date Value Ref Range Status   05/10/2023 0.3 0.2 - 1.3 mg/dL Final   54/01/8118 0.4 0.2 - 1.3 MG/DL Final     Phosphorus   Date Value Ref Range Status   03/04/2023 3.0 2.0 - 4.5 MG/DL Final     AST (SGOT)   Date Value Ref Range Status   03/04/2023 25 7 - 40 U/L Final     AST   Date Value Ref Range Status   05/10/2023 23 7 - 40 U/L Final     ALT (SGPT)   Date Value Ref Range Status   03/04/2023 24 7 - 56 U/L Final     ALT   Date Value Ref Range Status   05/10/2023 19 7 - 56 U/L Final     Alk Phosphatase   Date Value Ref Range Status   05/10/2023 44 25 - 110 U/L Final   03/04/2023 46 25 - 110 U/L Final     Cholesterol   Date Value Ref Range Status   04/25/2023 165 <200 mg/dL Final   14/78/2956 213 <200 MG/DL Final     Triglycerides   Date Value Ref Range Status   04/25/2023 134 <150 mg/dL Final   08/65/7846 94 <962 MG/DL Final     HDL   Date Value Ref Range Status   04/25/2023 59 >40 mg/dL Final   95/28/4132 56 >44 MG/DL Final     LDL   Date Value Ref Range Status   04/25/2023 93 <100 mg/dL Final   05/26/7251 664 (H) <100 mg/dL Final     VLDL   Date Value Ref Range Status   04/25/2023 26.8 mg/dL Final   40/34/7425 19 MG/DL Final     TSH   Date Value Ref Range Status   05/09/2023 13.66 (H) 0.35 - 5.00 ?IU/mL Final   03/04/2023 1.60 0.35 - 5.00 MCU/ML Final     Hemoglobin A1C   Date Value Ref Range Status 05/10/2023 5.6 4.0 - 5.7 % Final     Comment:     The ADA recommends that most patients with type 1 and type 2 diabetes maintain an A1c level <7%.       Assessment:  Cindy Randolph is a 63 y.o. with  hiatal hernia and GERD with desire to discontinue PPI therapy     Plan:  After discussing surgical options she would like to proceed with  cTIF procedure (Laparoscopic hiatal hernia repair and Transoral incisional fundoplication)    The risks of surgery including, but not limited to, bleeding, infection, trocar related injury, damage to adjacent structures including the spleen, vagal nerves or esophagus, recurrence of hiatal hernia, dyspepsia or gas bloat, and possible need for future procedure were all discussed.  We discussed the post-fundoplication diet. She states understanding and wishes to proceed.           Bufford Lope, MD                                                                            06/03/23

## 2023-06-06 ENCOUNTER — Encounter: Admit: 2023-06-06 | Discharge: 2023-06-06 | Payer: MEDICARE

## 2023-06-06 ENCOUNTER — Ambulatory Visit: Admit: 2023-06-06 | Discharge: 2023-06-07 | Payer: MEDICARE

## 2023-06-07 ENCOUNTER — Encounter: Admit: 2023-06-07 | Discharge: 2023-06-07 | Payer: MEDICARE

## 2023-06-07 DIAGNOSIS — C44319 Basal cell carcinoma of skin of other parts of face: Secondary | ICD-10-CM

## 2023-06-08 ENCOUNTER — Ambulatory Visit: Admit: 2023-06-08 | Discharge: 2023-06-09 | Payer: MEDICARE

## 2023-06-08 ENCOUNTER — Encounter: Admit: 2023-06-08 | Discharge: 2023-06-08 | Payer: MEDICARE

## 2023-06-09 ENCOUNTER — Encounter: Admit: 2023-06-09 | Discharge: 2023-06-09 | Payer: MEDICARE

## 2023-06-12 ENCOUNTER — Encounter: Admit: 2023-06-12 | Discharge: 2023-06-12 | Payer: MEDICARE

## 2023-06-14 ENCOUNTER — Ambulatory Visit: Admit: 2023-06-14 | Discharge: 2023-06-15 | Payer: MEDICARE

## 2023-06-14 ENCOUNTER — Encounter: Admit: 2023-06-14 | Discharge: 2023-06-14 | Payer: MEDICARE

## 2023-06-15 ENCOUNTER — Encounter: Admit: 2023-06-15 | Discharge: 2023-06-15 | Payer: MEDICARE

## 2023-06-15 ENCOUNTER — Encounter: Admit: 2023-06-15 | Discharge: 2023-06-16 | Payer: MEDICARE

## 2023-06-16 ENCOUNTER — Encounter: Admit: 2023-06-16 | Discharge: 2023-06-16 | Payer: MEDICARE

## 2023-06-16 MED FILL — LIDOCAINE HCL 2 % MM SOLN: 2 % | ORAL | 5 days supply | Qty: 100 | Fill #1 | Status: CP

## 2023-06-16 MED FILL — PROMETHAZINE 12.5 MG RE SUPP: 12.5 mg | RECTAL | 3 days supply | Qty: 12 | Fill #1 | Status: CP

## 2023-06-16 MED FILL — AMOXICILLIN-POT CLAVULANATE 600-42.9 MG/5 ML PO SUSR: 600-42.95 mg/5 mL | ORAL | 9 days supply | Qty: 125 | Fill #1 | Status: CP

## 2023-06-16 MED FILL — SUCRALFATE 100 MG/ML PO SUSP: 100 mg/mL | ORAL | 12 days supply | Qty: 473 | Fill #1 | Status: CP

## 2023-06-16 MED FILL — ONDANSETRON 4 MG PO TBDI: 4 mg | ORAL | 7 days supply | Qty: 25 | Fill #1 | Status: CP

## 2023-06-17 ENCOUNTER — Encounter: Admit: 2023-06-17 | Discharge: 2023-06-17 | Payer: MEDICARE

## 2023-06-20 ENCOUNTER — Encounter: Admit: 2023-06-20 | Discharge: 2023-06-20 | Payer: MEDICARE

## 2023-06-21 ENCOUNTER — Encounter: Admit: 2023-06-21 | Discharge: 2023-06-21 | Payer: MEDICARE

## 2023-06-21 NOTE — Telephone Encounter
LOV with PCP: 05/23/23  Future Appointments   Date Time Provider Department Center   06/23/2023  9:15 AM Rudean Haskell, MD [facility] Surgery   06/29/2023  1:00 PM Bonney Aid, APRN-NP [id number] CVM Exam   07/05/2023  8:15 AM Liberty Handy, PhD [facility] Psychiatry   07/05/2023  9:15 AM NEURO PSYCHOMETRICIAN [facility] Psychiatry   07/07/2023 12:30 PM Lizbeth Bark, MD [facility] None   07/20/2023  8:00 AM Glyn Ade, MD [facility] IM   07/21/2023  9:30 AM Coughenour, Marjean Donna [facility]   07/25/2023 10:00 AM Drucilla Schmidt, MBBS [facility] IM   09/26/2023 12:40 PM Assunta Curtis, MD [facility] IM   10/31/2023 10:00 AM Floydene Flock, APRN-NP [facility] [place] Exam   11/24/2023  9:15 AM Oletta Lamas, MD [facility] OB/GYN   11/29/2023 11:00 AM Zada Finders, MD RVFDERM IM   02/09/2024 10:00 AM Felipa Emory, APRN-NP [facility] [place] Radiati

## 2023-06-23 ENCOUNTER — Ambulatory Visit: Admit: 2023-06-23 | Discharge: 2023-06-24 | Payer: MEDICARE

## 2023-06-23 ENCOUNTER — Encounter: Admit: 2023-06-23 | Discharge: 2023-06-23 | Payer: MEDICARE

## 2023-06-23 DIAGNOSIS — K449 Diaphragmatic hernia without obstruction or gangrene: Secondary | ICD-10-CM

## 2023-06-23 NOTE — Progress Notes
CC: Initial post-op appointment status post c-TIF with hiatal hernia repair    Subjective:   Franceska Strahm is a 63 y.o. status post above.  Patient is recovering without significant issue.  She is tolerating fluids.  She has tried to advance to pudding texture, but still having some issues with that.     PE:   Vitals:    06/23/23 0908   BP: 122/78   Pulse: 78   Temp: 36.3 ?C (97.3 ?F)   SpO2: 94%   PainSc: Four   Weight: 70 kg (154 lb 6.4 oz)   Height: 157.5 cm (5' 2)     Body mass index is 28.24 kg/m?Marland Kitchen      Abdomen - soft, nondistended, incision sites c/d/i, no evidence of hernia    PATHOLOGY REPORT   Date Value Ref Range Status   03/24/2022   Final    [facility]  [email or web address]    Department of Pathology and Laboratory Medicine  [address], [place], [place] [address]  Surgical Pathology Office:  [phone]  Fax:  [phone]  SURGICAL PATHOLOGY REPORT    NAME: JANNIE, DOYLE. SURG PATH #: [id number] MR #: [id number] SPECIMEN  CLASS: SI BILLING #: [id number] ALT ID #:  LOCATION: [id number] DATE OF  PROCEDURE: 03/24/2022 AGE:  61 SEX: F DATE RECEIVED: 03/24/2022 DOB:  06-10-1960  TIME RECEIVED:  09:57 PHYSICIAN: Odie Sera, MD DATE OF  REPORT: 03/26/2022 COPY TO: Ferrel Logan, DO DATE OF PRINTING: 03/26/2022         ########################################################################  Final Diagnosis:    A. Lymph node, right axilla, biopsy:    Fragments of lymph node, negative for carcinoma. See comment.    Comment:  Immunohistochemical stain for pancytokeratin (performed on block A1) is  negative. Deeper levels were utilized in evaluation, supporting above  diagnosis.    Pursuant to the Quality Assurance Program at the [facility] Pathology Department, selected slides from this case have been  concurrently reviewed by the following pathologist: Dr. Milbert Coulter who agrees  with the final diagnosis.       Attestation:  By this signature, I attest that I have personally formulated the final  interpretation expressed in this report and that the above diagnosis is  based upon my examination of the slides and/or other material indicated in  this report.    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         Gross Description:  A. Received in formalin labeled right axilla is a 1.4 x 0.8 x 0.2 cm  aggregate of cylindrical yellow-tan, bloodstained, fatty tissue fragments.  The specimen is entirely submitted in A1. The breast biopsy is removed  from the patient at 08:22 on 03/24/2022, placed in formalin at 08:27 on  03/24/2022, and not removed from formalin until 23:40 on 03/24/2022. (jm)    jm/03/24/2022           If immunohistochemical stains and/or in situ hybridization are cited in  this report, the performance characteristics were determined by the  Department of Pathology and Laboratory Medicine of the  [facility]) in compliance with [employer]. Some of these tests rely on the use of analyte specific  reagents and are subject to specific labeling requirements by the FDA.  The stains are performed on formalin-fixed, paraffin-embedded tissue,  unless otherwise stated. Known positive and negative control tissues  demonstrate appropriate staining. Results should be interpreted with  caution given the likelihood of false negativity on decalcified specimens.  This testing was developed by the Department of Pathology and Laboratory  Medicine of the [facility]. It has not been cleared or approved  by the FDA.  The FDA has determined that such clearance or approval is not  necessary.    Testing performed by the [facility] at [address], [place], [place] [address]. CLIA # [id number].     ]    Body mass index is 28.24 kg/m?.      A/P:  63 y.o. female s/p laparoscopic hiatal hernia with c-TIF    Patient is recovering as anticipated.  I have discussed continuing to follow the guidelines for diet advancement.  She has upcoming appointment with Dr. Burnis Medin.  She will continue with lifting restrictions for a total of 6 weeks.  Patient may follow-up as needed.

## 2023-06-27 ENCOUNTER — Encounter: Admit: 2023-06-27 | Discharge: 2023-06-27 | Payer: MEDICARE

## 2023-06-27 ENCOUNTER — Ambulatory Visit: Admit: 2023-06-27 | Discharge: 2023-06-27 | Payer: MEDICARE

## 2023-06-27 DIAGNOSIS — L738 Other specified follicular disorders: Secondary | ICD-10-CM

## 2023-06-27 DIAGNOSIS — L821 Other seborrheic keratosis: Secondary | ICD-10-CM

## 2023-06-27 DIAGNOSIS — L570 Actinic keratosis: Secondary | ICD-10-CM

## 2023-06-27 DIAGNOSIS — R1084 Generalized abdominal pain: Secondary | ICD-10-CM

## 2023-06-27 NOTE — Progress Notes
Date of Service: 06/27/2023     Dermatology Problem List:   # Dyshidrotic Eczema  - Clobetasol 0.5% ointment    # AK   - LN2    # Personal history of skin cancer: Yes  NMSC  Cancer type Location Date of bx Accession # Procedure type/date   [facility] Left cheek 06/01/2023 [id number]  Pending MMS Dr. Lowell Guitar 07/20/2023            # FH NMSC - mother    # History of Breast Cancer S/p Radiation and Chemotherapy     Subjective:             Cindy Randolph is a 63 y.o. female.    History of Present Illness  Return  PATIENT referred by [facility]. Last seen 05/2023 with Dr. Cecille Rubin    Today here for spots of concern:    raised/bumpy growth(s)  Location: left cheek under biopsied BCC  Duration: few weeks  Associated local and/or systemic symptoms: none  Palliative factors: none  Provocative factors: none  Prior evaluation: none  Prior investigation: none  Previous treatment: none  Current treatment: none     The left temple scaly area has still persisted after cryotherapy. No growth    Not yet started the chemo cream to forehead and temples.       Review of Systems   Constitutional:  Negative for appetite change, diaphoresis, fatigue, fever and unexpected weight change.   HENT:  Negative for congestion, mouth sores and sore throat.    Eyes:  Negative for pain, redness, itching and visual disturbance.   Respiratory:  Negative for cough and shortness of breath.    Cardiovascular:  Negative for palpitations and leg swelling.   Gastrointestinal:  Negative for abdominal pain, blood in stool, diarrhea, nausea and vomiting.   Genitourinary:  Negative for difficulty urinating and hematuria.   Musculoskeletal:  Negative for arthralgias and myalgias.   Neurological:  Negative for dizziness, seizures and facial asymmetry.   Hematological:  Does not bruise/bleed easily.   Psychiatric/Behavioral:  Negative for confusion and dysphoric mood. The patient is not nervous/anxious.      Objective:          acetaminophen (TYLENOL) 160 mg/5 mL oral suspension Take 10.2 mL by mouth every 6 hours as needed. Indications: pain    acetaminophen (TYLENOL) 325 mg tablet Take two tablets by mouth every 6 hours. Take scheduled for 3 days after surgery, then as needed. Do not exceed 4,000mg  in a 24 hour period.    amLODIPine (NORVASC) 5 mg tablet Take one tablet by mouth daily.    aspirin 81 mg chewable tablet Chew one tablet by mouth daily.    biotin 1 mg cap Take one capsule by mouth daily.    calcipotriene (DOVONEX) 0.005 % topical cream Apply twice daily to forehead and temple for 4 days.    calcium carbonate (OS-CAL) 1250 mg (500 mg elemental calcium) tablet Take one tablet by mouth daily.    CHOLEcalciferoL (vitamin D3) 5000 unit tablet Take one tablet by mouth daily.    clobetasoL (TEMOVATE) 0.05 % topical ointment Apply  topically to affected area twice daily. Apply to areas of rash on hands twice daily; do NOT use on face/groin/underarms, use up to 2 weeks per month    diclofenac sodium (VOLTAREN) 1 % topical gel Apply four g topically to affected area daily as needed.    ezetimibe (ZETIA) 10 mg tablet Take one tablet by mouth daily.  Fish,Bora,Flax Oils-OM3,6,9 #1 (TRIPLE OMEGA) 400-400-400 mg capsule Take one capsule by mouth at bedtime daily.    lidocaine hcl viscous (LIDOCAINE VISCOUS) 2 % solution Swish and Swallow 5 mL by mouth as directed four times daily as needed. Indications: suppression of the gag reflex    metoprolol succinate XL (TOPROL XL) 25 mg extended release tablet Take one tablet by mouth at bedtime daily.    Miscellaneous [employer] misc DME requirements, rx to include the following typed information:    NPI: [id number]  Doctor's typed name: Assunta Curtis, MD  ICD 10: G47.33  Length of need: 99 months  Start date:02/14/2023  Signature date: 02/14/2023  Detailed equipment: CPAP machine with any accessories.    naltrexone 1 mg oral capsule (BATCHED COMPOUND) Take 1 capsule by mouth daily for 1 week, THEN take 2 capsules by mouth daily x 1 week, THEN take 3 capsules daily x 1 week, THEN take 4 capsules daily thereafter.    omeprazole DR (PRILOSEC) 40 mg capsule Take one capsule by mouth daily. Open the capsule in apple juice for 2 months, then stop.  Indications: gastroesophageal reflux disease    ondansetron (ZOFRAN ODT) 4 mg rapid dissolve tablet Dissolve one tablet by mouth every 6 hours as needed for Nausea. Place on tongue to dissolve.  Indications: prevent nausea and vomiting after surgery    other medication Take one Dose by mouth daily. K Complete K1 & K2 as MK-4 & MK-7 : Take 1 softgel by mouth once daily    polyethylene glycol 3350 (MIRALAX) 17 g packet Take one packet by mouth daily. Increase as needed in order to have 1-2 soft bowel movements per day without straining.  Indications: constipation    promethazine (PROMETHEGAN) 12.5 mg rectal suppository Insert or Apply one suppository to rectal area as directed every 6 hours as needed for Nausea. Indications: prevent nausea and vomiting after surgery    rosuvastatin (CRESTOR) 10 mg tablet Take one tablet by mouth daily.    Selenium 100 mcg tab Take one tablet by mouth daily.    simethicone (MYLICON) 40 mg/0.6 mL drops Take 0.6 mL by mouth every 6 hours as needed. Indications: For gas pain    sodium,potassium,mag sulfates (SUPREP BOWEL PREP KIT) 17.5-3.13-1.6 gram oral solution Evening prior: mix 1 bottle with 16 ounces of water and drink orally, followed by 32 additional ounces of water.  Day of: mix 2nd bottle and drink orally, followed by 32 ounces of water. Complete the bowel preparation at least 2 hrs before procedure.    sucralfate (CARAFATE) 100 mg/mL oral suspension Take 10 mL by mouth every 6 hours. Take for 4 weeks following surgery.  Indications: stress ulcer prevention    SUMAtriptan succinate (IMITREX) 25 mg tablet Take one tablet by mouth at onset of headache. May repeat after 2 hours if needed. Max of 200 mg in 24 hours.    SYNTHROID 100 mcg tablet Take one tablet by mouth daily 30 minutes before breakfast.    tiZANidine (ZANAFLEX) 2 mg tablet Take one tablet by mouth twice daily as needed.    traMADoL (ULTRAM) 50 mg tablet Take one-half tablet to one tablet by mouth every 8 hours as needed for Pain. Indications: pain    vit A/vit C/vit E/zinc/copper (PRESERVISION AREDS PO) Take 1 tablet by mouth daily.    vitamins, B complex tab Take one tablet by mouth daily.    vitamins, multiple cap Take one capsule by mouth daily.  There were no vitals filed for this visit.    There is no height or weight on file to calculate BMI.     Physical Exam    Areas Examined (all normal unless noted below):  Head/Face    Pertinent findings include:    - left cheek, yellowish, tan papules with central umbilication on the forehead and cheeks  - left temple erythematous macule with overlying gritty yellow scale   - left chest tan/brown, waxy, stuck-on papule with comedo like openings, milia like cysts, and fingerprint/network like structures       Assessment and Plan:    # Seborrheic keratosis  # Sebaceous hyperplasia  - Benign diagnosis, etiology, expected course/progression discussed with patient, who verbalized understanding.  - This author recommends monitoring/observation at this time. Patient agreed, but will return if changes noted or symptoms arise. she verbalized understanding and is in agreement with this recommendation/plan.  - Counseled on appropriate sun avoidance by limiting outdoor activities between 10AM-4PM, applying sunscreen with SPF 30 or above (with reapplication if wiped off or per instructions on bottle), and using a physical blockade by protective clothing. The ABCDEs of melanoma was discussed.  - Patient's questions/concerns addressed    # Actinic Keratosis  -Discussed diagnosis, etiology, and recommended treatment options  -Discussed risks and benefits of LN2 including expected blistering/scabbing with possibility of depigmentation and scarring.  -LN2 -f/t/f x 1 lesions  -Counseled to call for reevaluation if lesions do not resolve  -sunprotection advised  -After discussion, patient opted for topical therapy  - HOLD (Rx) efudex 5%-calcipotriene (DOVONEX) 0.005 % topical cream; Apply twice daily to forehead and temple for 4 days.  Dispense: 60 g; Refill: 1. Patient has had resolution after cryotherapy from last visit and has upcoming MMS      RTC 6 months FBSE, sooner as needed    In the presence of Zada Finders, MD,  I have taken down these notes, Alisha, Scribe. 06/27/2023 12:56 PM                      Zada Finders, MD MSc FAAD  Assistant Professor - Division of Dermatology  Department of Internal Medicine  [facility]

## 2023-06-27 NOTE — Telephone Encounter
Sorry to hear that you're experiencing pain.   I suggest to start on liquid diet and have a abd x-ray first.

## 2023-06-27 NOTE — Patient Instructions
 Thank you for choosing [facility]. I am happy that you are allowing me to participate in your care.     A summary of the recommendations made at today's visit is below:  CRYOTHERAPY INSTRUCTIONS    What should I expect after cryosurgery?  2 -- 24 hours:  Redness and swelling occurs almost immediately; a firm blister may form that may contain blood or clear fluid.    1 -- 2 days:  The blister (if present) may break and a clear to yellowish drainage and/or crust may develop.  Some bleeding may occur.  The site should not be noticeably painful.  3 -- 10 days:  Crusting and drainage will continue, but the swelling will decrease.  The redness will begin resolving.  10 days - 3 weeks:  Healing will continue, with residual redness.  3 - 6 weeks:  Complete healing.    How do I care for the treated area?  Wash the treated areas daily. Allow soapy water to run over the areas, but do not scrub.  Should a scab or crust form, allow it to fall off on its own - do not remove or ?pick? at it.  The crust is protective to the new, healthy tissue growing underneath.    Application of ointment and a bandage may make you feel more comfortable, but is not necessary. If you would like to use an ointment to the cryotherapy site, we recommend Vaseline (petroleum jelly) or Aquaphor/Eucerin healing ointment     Will there be pain?  Pain is typically minimal. If you experience pain following the procedure, and your other medical conditions allow, Tylenol may be used.      If pain is severe, contact your physician.     Long term cryotherapy care  The cryotherapy site will be more sun sensitive than your surrounding skin.  Keep it covered, and remember to apply sun screen every day to all your sun exposed skin.  A scar may remain which is lighter or pinker than your normal skin. Your body will continue to improve your scar for up to one year, however a light colored scar may remain.  Infection following cryotherapy is rare. However, if you are worried by the appearance the treated area, contact your doctor.     General recommendations  Applying creams/ointments  If you were prescribed a topical medication, a small amount will suffice. A line of cream/ointment placed on your fingertip to the first joint (where the fingertip bends) should be sufficient to cover a surface area of the size of the palm of your hand. You do not need a thick layer the prescription cream/ointment.     Your rash may return after application of the prescription topical medicine. Repeat the course of treatment as discussed in your visit with me. Please let me know if you don't get enough relief, the rash spreads, or if new or worsening symptoms develop.    Gentle skin care for everyday  - Bathe with lukewarm water, 5-10 minutes. Dry thoroughly afterward by patting yourself with a towel. Avoid heavy rubbing  - Use a mild soap, such as Dove, Cetaphil, or any fragrance-free product  - Apply a moisturizer of your choice as much as you can (there's no limit like in prescription topical medications), but at least 2 times a day (even if no bath is taken) and after every bath.  After the bath, pat dry and apply the moisturizer right away. Use a thick moisturizer that says cream  or ointment (not lotion that comes in a pump bottle - this is not as moisturizing, but does work). The stickier the better (like Vaseline or Aquaphor)! However, find one that you like and will use.  - For your face, you may use a gentle cleanser (such as CeraVe or Cetaphil just as examples) or plain water, followed by a non-comedogenic (meaning it won't clog pores) moisturizer with sunscreen SPF 30 or higher. A mineral sunscreen with zinc, titanium, or iron oxide are preferable      Sun protection is KEY to success in taking care of your skin!  Using sunscreens and sun avoidance will decrease your risk of developing skin cancers like melanoma, as well as prevent developing signs of aging like wrinkles, redness, and brown/dark spots.  - Avoid the sun at peak hours of the day (10 AM - 4 PM)   - Wear sunscreen SPF 30 or above. It should be reapplied every 2 hours if possible, or when you wipe off with a towel after swimming  - Use sunprotective clothing (shirts with long sleeves, pants, and wide-brimmed cowboy style hat). Baseball caps don't work well enough!    ** FOR MOISTURIZERS AND SUNSCREENS: I RECOMMEND ANY PRODUCT THAT YOU LIKE AND WILL USE CONSISTENTLY -- ANY BRAND/PRICE IS ACCEPTABLE **    Moles and other brown spots   - Common melanocytic nevi, or moles, tend to be less than 6 mm in diameter (size of a pencil eraser) and symmetric with even pigmentation, round or oval shape, regular outline, and sharp, non-fuzzy border. They can stick out from the skin.  - Flaky, seemingly stuck-on brown bumps are usually benign/non-cancerous keratoses, not moles. You will likely get more of these with time.  - Bright red smooth bumps that do not bleed are usually benign blood vessel lesions (cherry angiomas), not moles.  - It is normal to get new moles until the age of 40-98 years old.    Please contact me if you notice the following:  - A mole that differs from others (an ugly duckling that has an odd shape, uneven or uncertain border, different colors), or one that changes, bleeds, or itches.  - Any growth that has one or more of the following features:  A - Asymmetry. (Concerning if spot is not symmetric--one half is unlike the other half)  B - Border. (Irregular, notched, or poorly defined border are concerning)  C - Color. (Multiple colors [shades of tan, brown or black, or is sometimes white, red, or blue] or changes in color are concerning)  D - Diameter. (Larger than 6mm, i.e., a pencil eraser)  E - Evolution. (An evolving or changing spot, which looks different from others on your body. If there's a new itch, tenderness, or bleeding develop, these are concerning changes too.  - An uneven, new, or large (greater than 3mm) brown or black streak underneath a fingernail or toenail, which may or may not spread onto the skin next to the nail  - A sore that just won't heal after about 4 weeks    Cost of prescriptions  We do everything that we can to get your prescriptions approved through your insurance company. Unfortunately, our efforts may not always work. I understand this is frustrating. Some options to help make your prescription medications more affordable include GoodRx (https://www.goodrx.com/) and [facility] (https://costplusdrugs.com/medications/categories/hair-&-skin-health/). Please let me know if your prescription is too expensive at your regular pharmacy and we can try one of those  options. Even if it is covered by your insurance, your pharmacy may not communicate with [place].          Contact info  Routine - Please don't hesitate to let me know if you have additional questions or concerns before your next visit. I recommend directing your inquiries through MyChart. However, if you do not have MyChart access or prefer calling, the phone number is [phone], Option 4.   Urgent - For urgent, after-hours/weekend issues that are not emergencies, please call the [facility] Operator at [phone]. Ask to have paged the dermatologist on call. You will receive a call back from the dermatologist.  Emergency - Please call 911 or go to the nearest Emergency Department for all medical emergencies.         Zada Finders, MD MSc FAAD  Assistant Professor - Dermatology  Department of Internal Medicine  [facility]

## 2023-06-28 ENCOUNTER — Encounter: Admit: 2023-06-28 | Discharge: 2023-06-28 | Payer: MEDICARE

## 2023-06-28 ENCOUNTER — Ambulatory Visit: Admit: 2023-06-28 | Discharge: 2023-06-29 | Payer: MEDICARE

## 2023-06-29 ENCOUNTER — Encounter: Admit: 2023-06-29 | Discharge: 2023-06-29 | Payer: MEDICARE

## 2023-06-29 ENCOUNTER — Ambulatory Visit: Admit: 2023-06-29 | Discharge: 2023-06-30 | Payer: MEDICARE

## 2023-06-29 DIAGNOSIS — Z8249 Family history of ischemic heart disease and other diseases of the circulatory system: Secondary | ICD-10-CM

## 2023-06-29 DIAGNOSIS — R931 Abnormal findings on diagnostic imaging of heart and coronary circulation: Secondary | ICD-10-CM

## 2023-06-29 DIAGNOSIS — Z79899 Other long term (current) drug therapy: Secondary | ICD-10-CM

## 2023-06-29 DIAGNOSIS — E7841 Elevated Lipoprotein(a): Secondary | ICD-10-CM

## 2023-06-29 DIAGNOSIS — Z789 Other specified health status: Secondary | ICD-10-CM

## 2023-06-29 DIAGNOSIS — E785 Hyperlipidemia, unspecified: Secondary | ICD-10-CM

## 2023-06-29 DIAGNOSIS — I10 Essential (primary) hypertension: Secondary | ICD-10-CM

## 2023-06-29 MED ORDER — PITAVASTATIN CALCIUM 2 MG PO TAB
2 mg | ORAL_TABLET | Freq: Every day | ORAL | 11 refills | 34.00000 days | Status: AC
Start: 2023-06-29 — End: ?

## 2023-06-29 NOTE — Patient Instructions
START taking Livalo 2 mg daily     [facility] Forest would like for you to have fasting labs drawn tomorrow, and again in 2 months after starting the Livalo. If you have your labs drawn at a [facility] facility, you do not need to take orders with you. If you would like to have your labs drawn at an outside facility, you will need hard copies of your lab orders or we can fax them to the lab for you.     FOLLOW UP: PLEASE SCHEDULE BEFORE LEAVING    Please call scheduling at [phone]  to schedule your 4 month follow up appointment with Talbert Forest.     NOTE:  Please arrive 20 minutes before appointment time to allow adequate time for registration and rooming.    In order to provide you the best care possible we ask that you follow up as below:    For questions: please call [facility] nursing line at [phone] Monday - Friday 8-5 only.         Please leave a detailed message with your name, date of birth, and reason for your call.  A nurse will return your call as soon as possible.     For all medication refills: please contact your pharmacy.      Scheduling: Please call [phone]    It was truly our pleasure seeing you today. Thank you for choosing [facility] for your heart health!    Instructions given by Greig Castilla, RN

## 2023-06-29 NOTE — Progress Notes
Date of Service: 06/29/2023    Cindy Randolph is a 63 y.o. female.       HPI     She is a pleasant 63 year old female being seen for risk factor reduction.  She is followed by Dr. Hale Bogus.  She has a history of breast cancer status post bilateral mastectomy, elevated CT calcium score, family history of premature coronary artery disease, elevated LP(a), hypertension and dyslipidemia.  She has not tolerated rosuvastatin even in very low doses she had significant myalgias and elevated CK level.    She is tolerating Zetia.  LDL remains above goal.    Recent hernia repair.  Not currently exercising.  She has been on a full liquid diet.    She has not had any anginal symptoms.  She denies orthopnea or PND.    Blood pressure has been optimally controlled.  She is on amlodipine and metoprolol.  She takes both of these in the evening time.  She has noticed that sometimes midafternoon blood pressures are higher than morning blood pressures.    Father had MI in his 66s and bypass surgery.  She has 2 brothers with coronary disease and elevated LP(a).  1 brother  had coronary stent placement.       Vitals:    06/29/23 1247   BP: (!) 127/94   BP Source: Arm, Right Upper   Pulse: 75   O2 Device: CPAP/BiPAP  Comment: CPAP   O2 Liter Flow: Comment: Unknown   PainSc: Five   Weight: 71 kg (156 lb 8 oz)   Height: 158.8 cm (5' 2.5)     Body mass index is 28.17 kg/m?Marland Kitchen     Past Medical History  Patient Active Problem List    Diagnosis Date Noted    Statin intolerance 06/29/2023    Family history of coronary artery disease 06/29/2023    Elevated lipoprotein(a) 06/29/2023    Hiatal hernia 06/15/2023    Urinary incontinence 05/23/2023    Muscle cramps 05/23/2023    Diarrhea of presumed infectious origin 05/09/2023    Chronic pelvic pain in female 02/10/2023    Obstructive sleep apnea 02/10/2023    Deformity of right hand 11/19/2022    Insomnia 09/29/2022    Memory changes 08/19/2022    Mixed hyperlipidemia 08/10/2022    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       Radiation-induced pulmonary fibrosis (HCC) 06/22/2022    Recurrent major depression in full remission (HCC) 06/22/2022     Was previously on lexapro which she tolerated well.      Migraine with aura and without status migrainosus, not intractable 06/22/2022    Chronic pain of multiple joints 06/22/2022    Elevated coronary artery calcium score 06/17/2022     03/06/2022- CT CAC Score [facility] : CAC= 136,  80th percentile rank for females age 18-60. Left main: 0 ,LAD: 109 ,LCX: 27 ,RCA: 0         Hiatal hernia with gastroesophageal reflux 06/17/2022     04/01/2022- Per OV Dr. Maisie Fus  Menopausal osteoporosis 07/13/2021    Excess skin of breast 01/15/2021    Malignant neoplasm of left breast in female, estrogen receptor positive (HCC) 12/22/2020     DIAGNOSIS:  Left grade 1 ILC (ER  96%, PR 10%, HER2 0, Ki-67 3%) at 12:00 dx 11/2020     HISTORY:  Ms. Crumpler is a female who presented to the [facility] on 12/24/2020 at age 40 for left breast cancer.  She reports noticing Left breast intermittent discomfort over the last 2 months.  She had Screening mammogram at [facility] on 11/25/2020 which identified a focal asymmetry in the left breast.  She returned for left diagnostic mammgoram and ultrasound on 12/04/2020 which showed an ill-defined hypoechoic area at 12:00 7 cm FTN measuring 1.7 cm.   She underwent Left ultrasound guided biopsy on 12/10/2020 which revealed grade 1 hormone positive, HER2 negative ILC with associated LCIS. She has no breast complaints.  She proceeded with Bilateral total mastectomy/Left SLNB with oncoplastic flat closure on 01/22/2021.  Final surgical pathology revealed multifocal grade 1 ILC that merge to measuring 7.4 cm with associated LCIS, clear margins and 5/5 lymph nodes. The right breast is benign.   Adjuvant chemotherapy and cALND were recommended.  She completed adjuvant chemotherapy of dd AC from 10/3 to 04/06/2021, taxol was recommended but she declined.  She returned for Left completion ALND on 05/14/2021.  Final surgical pathology revealed 6/14 lymph nodes positive. She finished radiation with Dr. Haskel Khan on 07/01/21.    BREAST IMAGING:  Mammogram:    - Screening mammogram 11/25/2020 ([facility]) revealed scattered fibroglandular tissue density.  Benign appearing microcalcification. Unchanged right upper central anterior breast low density circumscribed masses dating back to 2013.  Focal asymmetry located within the left posterior central breast, 12:00, 7.3 cm FTN most conspicuous on MLO.  Recommend spot compression.    - Left diagnostic mammogram 12/04/2020 ([facility]) revealed irregular density at 12:00 left breast persists.  Ultrasound recommended.    Ultrasound:    Left targeted ultrasound 12/04/2020 ([facility]) revealed an ill-defined area which was slightly hypoechoic at 12:00, 7 cm FTN.  This was indeterminate and biopsy was recommended.   MRI:    - Breast MRI 12/26/2020 ([place]) The breast tissue is scattered areas of fibroglandular tissue.   There is mild background parenchymal enhancement. Left breast: There are multiple faint discontiguous irregular enhancing masses and discontiguous intervening nonmass enhancement involving predominantly the upper and inner left breast from anterior to posterior depth measuring in aggregate 6.3 cm AP by 3.6 cm transverse by 6.4 cm craniocaudal. Several areas demonstrate persistent enhancement kinetics. The artifact from tissue marker clips (ribbon and heart) at the site of biopsy demonstrating malignancy are seen within the upper left breast at the 12:00 position, middle depth (image 80). A representative discontiguous irregular enhancing mass is seen within the inner left breast at the 9:00 position, middle depth as on image 111, which demonstrates a mammographic correlate on outside CC tomosynthesis slice 18. Anterior extent of abnormal enhancement extends to the base of the left nipple. Posterior extent of abnormal enhancement is approximately 2.1 cm anterior to the underlying pectoralis muscle. There are greater than 5 small round morphologically abnormal and asymmetric level 1 left axillary lymph nodes. No suspicious level 2 or level 3 lymph nodes are seen. No suspicious internal mammary lymph nodes are seen. Right breast: No suspicious mass or nonmass enhancement is seen within the right breast. No suspicious right axillary or internal mammary lymph nodes are identified. Incidental Findings:  None     REPRODUCTIVE HEALTH:  Age at first Menarche:  59  Age at First Live Birth:  33  Age at Menopause:  56  Gravida:  8  Para: 8  Breastfeeding:  yes    PROCEDURE:  Bilateral Total mastectomy/Left SLNB oncoplastic flat closure 01/22/2021 ([facility])  2. Left ALND 05/14/2021 [facility])  PATHOLOGY: multifocal grade 1 ILC that merge to measuring 7.4 cm with associated LCIS, clear margins and 5/5 lymph nodes. The right breast is benign.   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  No family history of ovarian or prostate cancer.   MEDICAL ONCOLOGY:    Dr. Neil Crouch  transferred to Dr. Donneta Romberg Adjuvant chemotherapy:  ddACx 4 completed 04/06/2021, Taxol recommended but declined;   Present therapy:  Letrozole/Verzenio  REFERRED BY:  Dr. Erskine Emery      Formatting of this note might be different from the original. Formatting of this note might be different from the original. DIAGNOSIS:  Left grade 1 ILC (ER 91-100, PR 11-20, HER 1+, Ki 67 2-5%) at 12:00 dx 11/2020 HISTORY:  Ms. Polio is a female who presented to the [facility] on 12/24/2020 at age 62 for left breast cancer.  She reports noticing Left breast intermittent discomfort over the last 2 months.  She had Screening mammogram at [facility] on 11/25/2020 which identified a focal asymmetry in the left breast.  She returned for left diagnostic mammgoram and ultrasound on 12/04/2020 which showed an ill-defined hypoechoic area at 12:00 7 cm FTN measuring 1.7 cm.   She underwent Left ultrasound guided biopsy on 12/10/2020 which revealed grade 1 hormone positive, HER2 negative ILC with associated LCIS. She has no breast complaints. BREAST IMAGING: Mammogram:   - Screening mammogram 11/25/2020 ([facility]) revealed scattered fibroglandular tissue density.  Benign appearing microcalcification. Unchanged right upper central anterior breast low density circumscribed masses dating back to 2013.  Focal asymmetry located within the left posterior central breast, 12:00, 7.3 cm FTN most conspicuous on MLO.  Recommend spot compression.   - Left diagnostic mammogram 12/04/2020 ([facility]) revealed irregular density at 12:00 left breast persists.  Ultrasound recommended.  Ultrasound:  Left targeted ultrasound 12/04/2020 ([facility]) revealed an ill-defined area which was slightly hypoechoic at 12:00, 7 cm FTN.  This was indeterminate and biopsy was recommended. MRI:   - Breast MRI 12/26/2020 ([place]) .... REPRODUCTIVE HEALTH: Age at first Menarche:  31 Age at First Live Birth:  10 Age at Menopause:  49 Gravida:  8 Para: 8 Breastfeeding:  yes PROCEDURE:  pending PERTINENT PMH:  Thyroid Cancer (1980- thyroidectomy), HTN FAMILY HISTORY:  Mother- Breast cancer (65).  No family history of ovarian or prostate cancer. PHYSICAL EXAM on PRESENTATION:   MEDICAL ONCOLOGY:    Dr. Neil Crouch REFERRED BY:  Dr. Erskine Emery      Hypermobility syndrome 05/24/2014    Degenerative disc disease, lumbar 02/21/2014    Primary hypertension 11/13/2007     Hypertension with pregnancy initially but sustained after 8th pregnancy.            Review of Systems   Constitutional: Negative.   HENT: Negative.     Eyes: Negative.    Cardiovascular: Negative.    Respiratory: Negative.     Endocrine: Negative.    Hematologic/Lymphatic: Negative.    Skin: Negative.    Musculoskeletal: Negative.    Gastrointestinal: Negative.    Genitourinary: Negative.    Neurological: Negative.  Psychiatric/Behavioral: Negative.     Allergic/Immunologic: Negative.        Physical Exam   Vitals reviewed.  Constitutional: She appears well-developed. No distress.   HENT:   Head: Normocephalic.   Cardiovascular: Normal rate.   Pulmonary/Chest: Effort normal. No respiratory distress.   Abdominal: Normal appearance.   Musculoskeletal:         General: Normal range of motion.      Cervical back: Normal range of motion.   Neurological: She is alert and oriented to person, place, and time.   Skin: Skin is warm and dry.   Psychiatric: Her behavior is normal. Judgment and thought content normal.         Cardiovascular Studies      Cardiovascular Health Factors  Vitals BP Readings from Last 3 Encounters:   06/29/23 (!) 127/94   06/23/23 122/78   06/16/23 98/66     Wt Readings from Last 3 Encounters:   06/29/23 71 kg (156 lb 8 oz)   06/27/23 69.9 kg (154 lb)   06/23/23 70 kg (154 lb 6.4 oz)     BMI Readings from Last 3 Encounters:   06/29/23 28.17 kg/m?   06/27/23 28.17 kg/m?   06/23/23 28.24 kg/m?      Smoking Social History     Tobacco Use   Smoking Status Never    Passive exposure: Past   Smokeless Tobacco Never   Tobacco Comments    Significant second hand smoke from parents 28-28 years old      Lipid Profile Cholesterol   Date Value Ref Range Status   04/25/2023 165 <200 mg/dL Final     HDL   Date Value Ref Range Status   04/25/2023 59 >40 mg/dL Final     LDL   Date Value Ref Range Status   04/25/2023 93 <100 mg/dL Final     Triglycerides   Date Value Ref Range Status 04/25/2023 134 <150 mg/dL Final      Blood Sugar Hemoglobin A1C   Date Value Ref Range Status   05/10/2023 5.6 4.0 - 5.7 % Final     Comment:     The ADA recommends that most patients with type 1 and type 2 diabetes maintain an A1c level <7%.     Glucose   Date Value Ref Range Status   06/16/2023 100 70 - 100 mg/dL Final   36/64/4034 96 70 - 100 mg/dL Final   74/25/9563 875 (H) 70 - 100 mg/dL Final          Problems Addressed Today  Encounter Diagnoses   Name Primary?    Elevated coronary artery calcium score Yes    Dyslipidemia, goal LDL below 70     Statin intolerance     Primary hypertension     Encounter for long-term (current) use of high-risk medication     Family history of coronary artery disease     Elevated lipoprotein(a)        Assessment and Plan     Elevated CT calcium score.  Elevated LP(a).  Family history premature coronary artery disease.  Will continue with risk factor reduction.  She is currently without anginal symptoms.  She will continue to follow with Dr. Hale Bogus as planned    Dyslipidemia with statin intolerance.  Untreated LDL 138.  APO B187.  LDL goal less than 65.  She did not tolerate even a low-dose of 5 mg of rosuvastatin.  She had elevated CK levels.  Symptoms have resolved with  discontinuing statin.  She remains on Zetia.  LDL checked recently remained elevated at 93.  HDL triglycerides are optimal.  We discussed Livalo 2 mg.  Will check CK and lipid in 2 months.  She will discontinue if she does not tolerate.  She may need PCSK9 inhibitor.  That was discussed today as well as Leqvio.  Surgery continue with heart healthy nutrition.    Hypertension is optimally controlled.  Consider switching amlodipine to a.m. continue with metoprolol at night    A1c 5.6 when checked in December    BMI of 28 remains unchanged continue with heart healthy nutrition balanced meals and portion control and exercise    25-hydroxy vitamin D is 52 is optimal         Current Medications (including today's revisions)   acetaminophen (TYLENOL) 160 mg/5 mL oral suspension Take 10.2 mL by mouth every 6 hours as needed. Indications: pain    acetaminophen (TYLENOL) 325 mg tablet Take two tablets by mouth every 6 hours. Take scheduled for 3 days after surgery, then as needed. Do not exceed 4,000mg  in a 24 hour period.    amLODIPine (NORVASC) 5 mg tablet Take one tablet by mouth daily.    aspirin 81 mg chewable tablet Chew one tablet by mouth daily.    biotin 1 mg cap Take one capsule by mouth daily.    calcipotriene (DOVONEX) 0.005 % topical cream Apply twice daily to forehead and temple for 4 days.    calcium carbonate (OS-CAL) 1250 mg (500 mg elemental calcium) tablet Take one tablet by mouth daily.    CHOLEcalciferoL (vitamin D3) 5000 unit tablet Take one tablet by mouth daily.    clobetasoL (TEMOVATE) 0.05 % topical ointment Apply  topically to affected area twice daily. Apply to areas of rash on hands twice daily; do NOT use on face/groin/underarms, use up to 2 weeks per month    diclofenac sodium (VOLTAREN) 1 % topical gel Apply four g topically to affected area daily as needed.    ezetimibe (ZETIA) 10 mg tablet Take one tablet by mouth daily.    Fish,Bora,Flax Oils-OM3,6,9 #1 (TRIPLE OMEGA) 400-400-400 mg capsule Take one capsule by mouth at bedtime daily.    lidocaine hcl viscous (LIDOCAINE VISCOUS) 2 % solution Swish and Swallow 5 mL by mouth as directed four times daily as needed. Indications: suppression of the gag reflex    metoprolol succinate XL (TOPROL XL) 25 mg extended release tablet Take one tablet by mouth at bedtime daily.    [facility] [employer] misc DME requirements, rx to include the following typed information:    NPI: [id number]  Doctor's typed name: Assunta Curtis, MD  ICD 10: G47.33  Length of need: 99 months  Start date:02/14/2023  Signature date: 02/14/2023  Detailed equipment: CPAP machine with any accessories.    naltrexone 1 mg oral capsule (BATCHED COMPOUND) Take 1 capsule by mouth daily for 1 week, THEN take 2 capsules by mouth daily x 1 week, THEN take 3 capsules daily x 1 week, THEN take 4 capsules daily thereafter.    omeprazole DR (PRILOSEC) 40 mg capsule Take one capsule by mouth daily. Open the capsule in apple juice for 2 months, then stop.  Indications: gastroesophageal reflux disease    ondansetron (ZOFRAN ODT) 4 mg rapid dissolve tablet Dissolve one tablet by mouth every 6 hours as needed for Nausea. Place on tongue to dissolve.  Indications: prevent nausea and vomiting after surgery    other medication Take  one Dose by mouth daily. K Complete K1 & K2 as MK-4 & MK-7 : Take 1 softgel by mouth once daily    peg 3350-electrolytes (GOLYTELY) 236-22.74-6.74 -5.86 gram oral solution Mix as directed on package.Drink Slowly over 24 hours, you can eat and drink normally.    pitavastatin calcium (LIVALO) 2 mg tablet Take one tablet by mouth daily.    polyethylene glycol 3350 (MIRALAX) 17 g packet Take one packet by mouth daily. Increase as needed in order to have 1-2 soft bowel movements per day without straining.  Indications: constipation    promethazine (PROMETHEGAN) 12.5 mg rectal suppository Insert or Apply one suppository to rectal area as directed every 6 hours as needed for Nausea. Indications: prevent nausea and vomiting after surgery    rosuvastatin (CRESTOR) 10 mg tablet Take one tablet by mouth daily.    Selenium 100 mcg tab Take one tablet by mouth daily.    simethicone (MYLICON) 40 mg/0.6 mL drops Take 0.6 mL by mouth every 6 hours as needed. Indications: For gas pain    sodium,potassium,mag sulfates (SUPREP BOWEL PREP KIT) 17.5-3.13-1.6 gram oral solution Evening prior: mix 1 bottle with 16 ounces of water and drink orally, followed by 32 additional ounces of water.  Day of: mix 2nd bottle and drink orally, followed by 32 ounces of water. Complete the bowel preparation at least 2 hrs before procedure. (Patient not taking: Reported on 06/29/2023)    sucralfate (CARAFATE) 100 mg/mL oral suspension Take 10 mL by mouth every 6 hours. Take for 4 weeks following surgery.  Indications: stress ulcer prevention    SUMAtriptan succinate (IMITREX) 25 mg tablet Take one tablet by mouth at onset of headache. May repeat after 2 hours if needed. Max of 200 mg in 24 hours.    SYNTHROID 100 mcg tablet Take one tablet by mouth daily 30 minutes before breakfast.    tiZANidine (ZANAFLEX) 2 mg tablet Take one tablet by mouth twice daily as needed.    traMADoL (ULTRAM) 50 mg tablet Take one-half tablet to one tablet by mouth every 8 hours as needed for Pain. Indications: pain    vit A/vit C/vit E/zinc/copper (PRESERVISION AREDS PO) Take 1 tablet by mouth daily.    vitamins, B complex tab Take one tablet by mouth daily.    vitamins, multiple cap Take one capsule by mouth daily.

## 2023-06-30 ENCOUNTER — Encounter: Admit: 2023-06-30 | Discharge: 2023-06-30 | Payer: MEDICARE

## 2023-06-30 ENCOUNTER — Ambulatory Visit: Admit: 2023-06-30 | Discharge: 2023-06-30 | Payer: MEDICARE

## 2023-06-30 ENCOUNTER — Ambulatory Visit: Admit: 2023-06-30 | Discharge: 2023-07-01 | Payer: MEDICARE

## 2023-06-30 MED ORDER — METOPROLOL SUCCINATE 25 MG PO TB24
25 mg | ORAL_TABLET | Freq: Every evening | ORAL | 3 refills | 90.00000 days | Status: AC
Start: 2023-06-30 — End: ?

## 2023-07-01 ENCOUNTER — Ambulatory Visit: Admit: 2023-07-01 | Discharge: 2023-07-02 | Payer: MEDICARE

## 2023-07-01 ENCOUNTER — Encounter: Admit: 2023-07-01 | Discharge: 2023-07-01 | Payer: MEDICARE

## 2023-07-04 ENCOUNTER — Encounter: Admit: 2023-07-04 | Discharge: 2023-07-04 | Payer: MEDICARE

## 2023-07-04 MED FILL — SUCRALFATE 100 MG/ML PO SUSP: 100 mg/mL | ORAL | 12 days supply | Qty: 473 | Fill #2 | Status: CP

## 2023-07-04 NOTE — Telephone Encounter
 Fax from [employer] with [facility] [employer] phone [phone] FAX [phone] asking for office visit notes from April 05, 2023 to present.    05/23/23 OV notes faxed to them. Vivi Barrack, RN

## 2023-07-05 ENCOUNTER — Encounter: Admit: 2023-07-05 | Discharge: 2023-07-05 | Payer: MEDICARE

## 2023-07-05 ENCOUNTER — Ambulatory Visit: Admit: 2023-07-05 | Discharge: 2023-07-06 | Payer: MEDICARE

## 2023-07-05 DIAGNOSIS — K143 Hypertrophy of tongue papillae: Secondary | ICD-10-CM

## 2023-07-05 DIAGNOSIS — K5900 Constipation, unspecified: Secondary | ICD-10-CM

## 2023-07-05 DIAGNOSIS — M25519 Pain in unspecified shoulder: Secondary | ICD-10-CM

## 2023-07-05 DIAGNOSIS — R140 Abdominal distension (gaseous): Secondary | ICD-10-CM

## 2023-07-05 DIAGNOSIS — K21 Gastroesophageal reflux disease with esophagitis without hemorrhage: Secondary | ICD-10-CM

## 2023-07-05 DIAGNOSIS — R0789 Other chest pain: Secondary | ICD-10-CM

## 2023-07-05 DIAGNOSIS — K449 Diaphragmatic hernia without obstruction or gangrene: Secondary | ICD-10-CM

## 2023-07-05 NOTE — Progress Notes
 Telehealth Visit Note    Date of Service: 07/05/2023    Subjective:           Cindy Randolph is a 63 y.o. female.    History of Present Illness    Cindy Randolph is a pleasant 63 y.o. female patient of Dr. Burnis Medin with history of GERD s/p cTIF 05/2023.     She presents today for follow up. She had cTIF a few weeks ago. She states she was much better at 1-week post-op but is doing worse at weeks 2-3 which she finds frustrating. She is following modified diet and feels like she is advancing even slower than the recommendations. She was having sharp, stabbing shoulder pains that were intermittent but severe. This pain occurs more when she feels more burpy and when she drinks something cold. She states that the shoulder pains are occurring less frequently at this point. She has esophagram and CT scheduled in March. She also noticed increased acid reflux over the past 4-5 days. This is worse overnight. She can be awake for hours overnight due to burning and gnawing pain around the site of cTIF. She has remained on omeprazole 40mg  twice daily due to this ongoing pain. She takes sucralfate 4 times daily and this does seem to help with burning pain. She has some discomfort about midway down her esophagus and noticed a coating on her tongue. She is concerned for thrush and has started Nystatin. She has tramadol prescription for arthritis and she has been using this due to ongoing pain but her prescription ran out. She denies concerns with constipation. She completed bowel cleanse and takes Miralax daily. Her stools are soft and easy to pass.     She is concerned that this pain is ongoing and limiting her activity. She is worried about deconditioning. She has not been able to walk as much as she usually does.                    Objective:          acetaminophen (TYLENOL) 160 mg/5 mL oral suspension Take 10.2 mL by mouth every 6 hours as needed. Indications: pain    acetaminophen (TYLENOL) 325 mg tablet Take two tablets by mouth every 6 hours. Take scheduled for 3 days after surgery, then as needed. Do not exceed 4,000mg  in a 24 hour period.    amLODIPine (NORVASC) 5 mg tablet Take one tablet by mouth daily.    aspirin 81 mg chewable tablet Chew one tablet by mouth daily.    biotin 1 mg cap Take one capsule by mouth daily.    calcipotriene (DOVONEX) 0.005 % topical cream Apply twice daily to forehead and temple for 4 days.    calcium carbonate (OS-CAL) 1250 mg (500 mg elemental calcium) tablet Take one tablet by mouth daily.    CHOLEcalciferoL (vitamin D3) 5000 unit tablet Take one tablet by mouth daily.    clobetasoL (TEMOVATE) 0.05 % topical ointment Apply  topically to affected area twice daily. Apply to areas of rash on hands twice daily; do NOT use on face/groin/underarms, use up to 2 weeks per month    diclofenac sodium (VOLTAREN) 1 % topical gel Apply four g topically to affected area daily as needed.    ezetimibe (ZETIA) 10 mg tablet Take one tablet by mouth daily.    Fish,Bora,Flax Oils-OM3,6,9 #1 (TRIPLE OMEGA) 400-400-400 mg capsule Take one capsule by mouth at bedtime daily.    lidocaine hcl viscous (LIDOCAINE VISCOUS)  2 % solution Swish and Swallow 5 mL by mouth as directed four times daily as needed. Indications: suppression of the gag reflex    metoprolol succinate XL (TOPROL XL) 25 mg extended release tablet Take one tablet by mouth at bedtime daily.    Miscellaneous Medical Supply misc DME requirements, rx to include the following typed information:    NPI: [id number]  Doctor's typed name: Assunta Curtis, MD  ICD 10: G47.33  Length of need: 99 months  Start date:02/14/2023  Signature date: 02/14/2023  Detailed equipment: CPAP machine with any accessories.    naltrexone 1 mg oral capsule (BATCHED COMPOUND) Take 1 capsule by mouth daily for 1 week, THEN take 2 capsules by mouth daily x 1 week, THEN take 3 capsules daily x 1 week, THEN take 4 capsules daily thereafter.    nystatin (MYCOSTATIN) 100,000 units/mL oral suspension Take 5 mL by mouth four times daily for 7 days.    omeprazole DR (PRILOSEC) 40 mg capsule Take one capsule by mouth daily. Open the capsule in apple juice for 2 months, then stop.  Indications: gastroesophageal reflux disease    ondansetron (ZOFRAN ODT) 4 mg rapid dissolve tablet Dissolve one tablet by mouth every 6 hours as needed for Nausea. Place on tongue to dissolve.  Indications: prevent nausea and vomiting after surgery    other medication Take one Dose by mouth daily. K Complete K1 & K2 as MK-4 & MK-7 : Take 1 softgel by mouth once daily    peg 3350-electrolytes (GOLYTELY) 236-22.74-6.74 -5.86 gram oral solution Mix as directed on package.Drink Slowly over 24 hours, you can eat and drink normally.    pitavastatin calcium (LIVALO) 2 mg tablet Take one tablet by mouth daily.    polyethylene glycol 3350 (MIRALAX) 17 g packet Take one packet by mouth daily. Increase as needed in order to have 1-2 soft bowel movements per day without straining.  Indications: constipation    promethazine (PROMETHEGAN) 12.5 mg rectal suppository Insert or Apply one suppository to rectal area as directed every 6 hours as needed for Nausea. Indications: prevent nausea and vomiting after surgery    rosuvastatin (CRESTOR) 10 mg tablet Take one tablet by mouth daily.    Selenium 100 mcg tab Take one tablet by mouth daily.    simethicone (MYLICON) 40 mg/0.6 mL drops Take 0.6 mL by mouth every 6 hours as needed. Indications: For gas pain    sodium,potassium,mag sulfates (SUPREP BOWEL PREP KIT) 17.5-3.13-1.6 gram oral solution Evening prior: mix 1 bottle with 16 ounces of water and drink orally, followed by 32 additional ounces of water.  Day of: mix 2nd bottle and drink orally, followed by 32 ounces of water. Complete the bowel preparation at least 2 hrs before procedure.    sucralfate (CARAFATE) 100 mg/mL oral suspension Take 10 mL by mouth every 6 hours. Take for 4 weeks following surgery.  Indications: stress ulcer prevention    SUMAtriptan succinate (IMITREX) 25 mg tablet Take one tablet by mouth at onset of headache. May repeat after 2 hours if needed. Max of 200 mg in 24 hours.    SYNTHROID 100 mcg tablet Take one tablet by mouth daily 30 minutes before breakfast.    tiZANidine (ZANAFLEX) 2 mg tablet Take one tablet by mouth twice daily as needed.    traMADoL (ULTRAM) 50 mg tablet Take one-half tablet to one tablet by mouth every 8 hours as needed for Pain. Indications: pain    vit A/vit C/vit E/zinc/copper (PRESERVISION  AREDS PO) Take 1 tablet by mouth daily.    vitamins, B complex tab Take one tablet by mouth daily.    vitamins, multiple cap Take one capsule by mouth daily.          Telehealth Patient Reported Vitals       Row Name 07/05/23 0949 07/05/23 0945             Weight: 67.1 kg (148 lb) 67.1 kg (148 lb)       Height: 157.5 cm (5' 2) 157.5 cm (5' 2)       Pain Score: Three Three       Pain Location: ABDOMEN ABDOMEN                     Telehealth Body Mass Index: [id number] at 07/05/2023 10:07 AM    Physical Exam  Constitutional:       Appearance: Normal appearance. She is not ill-appearing.   Neurological:      Mental Status: She is alert.   Psychiatric:         Attention and Perception: Attention normal.         Mood and Affect: Mood normal.         Speech: Speech normal.         Behavior: Behavior normal. Behavior is cooperative.         Limited physical exam due to the nature of virtual visit.        Assessment and Plan:    1. Gastroesophageal reflux disease with esophagitis without hemorrhage (Primary)  - Continue omeprazole 40mg  BID    2. Hiatal hernia  - s/p cTIF 05/2023  - Esophagram and CT abd as scheduled 07/2023 to evaluate ongoing post-op pain. OK to cancel procedures if pain resolves at that time  - Continue to gradually advance diet as directed/tolerated     3. Burning chest pain  - Continue sucralfate 10mL (1g) QID PRN    4. Gassiness  - Continue Gas-X up to 125mg  QID PRN  - Continue to walk as much as tolerable    5. Acute shoulder pain, unspecified laterality  - Improving at this time. Discussed that this could be referred pain from gas.     6. Tongue coating  - Continue Nystatin as prescribed     7. Constipation, unspecified constipation type  - Continue Miralax once daily             RTC 3 months with Dr. Burnis Medin, [facility] ok           30 minutes spent on this patient's encounter with counseling and coordination of care taking >50% of the visit.

## 2023-07-05 NOTE — Patient Instructions
 Patient Instructions:    1. Gastroesophageal reflux disease with esophagitis without hemorrhage (Primary)  - Continue omeprazole 40mg  twice daily     2. Hiatal hernia  - s/p cTIF 05/2023  - Esophagram and CT abd as scheduled 07/2023 to evaluate ongoing post-op pain. OK to cancel procedures if pain resolves at that time  - Continue to gradually advance diet as directed/tolerated      3. Burning chest pain  - Continue sucralfate 10mL (1g) four times daily as needed     4. Gassiness  - Continue Gas-X up to 125mg  four times daily as needed  - Continue to walk as much as tolerable     5. Acute shoulder pain, unspecified laterality  - Improving at this time. Discussed that this could be referred pain from gas.      6. Tongue coating  - Continue Nystatin as prescribed      7. Constipation, unspecified constipation type  - Continue Miralax once daily    Please contact my nurse via MyChart or at [phone] with any questions. Please follow up in clinic in 3 months. If you do not get scheduled immediately after your in-person visit or do not get a call to schedule a few days after your telehealth visit, please call [phone] option 1 to schedule. The appointment schedule is only out for 6 months so if your follow up is closer to a year please call as you reach that 6 month mark or anytime thereafter.     General Instructions:  To have a medication refilled:  Please use the MyChart Refill request or contact your pharmacy directly to request medication refills.  Please allow 72 hours.   If you have internet access/ smartphone please contact me through MyChart. This is our preferred method of contact and the most efficient way to contact your healthcare team.  Medical [facility] Building Lab is on the 1st floor. It is open from 7 am-6pm Tuesday-Friday and 6:30am-7pm on Mondays, and 7 am - Noon on Saturdays    [facility] is located on the 2nd floor and is open 8 am-5 pm Monday-Friday   [facility] location lab is located next to the check out desk and is open from 8 AM to 4:45 PM Monday through Friday.   Radiology is on the 2nd floor of the Medical Office Building and the 2nd Floor of [facility]. Radiology Scheduling can be reached at [phone]  To Schedule office visits:  Call [phone].  Option 1.   To Schedule an appointment with the dietician please call [phone]. Please visit [email or web address] for help finding a dietician in your area.   If you are being referred to our pelvic floor rehab provider please call [phone] to schedule your first appointment. You will need to call them, they will not call you.    For procedure scheduling questions at the [facility] or [place] please call [phone] option 3; for a procedure at [facility] please call [phone].  To schedule your appointment with pre-anesthesia clinic please call [phone]. Please make sure when you call you have your procedure appointment already made.  Support for many chronic illnesses is available through [employer]: [email or web address] or [phone].  For urgent questions on nights, weekends or holidays, call the Operator at [phone], and ask for the doctor on call for Gastroenterology. Call 911 for any emergencies.  If you need help with accessing your MyChart please call our help desk at [phone]  Are you a  medicare patient? Are you in the in the donut hole? Are you a medicare patient having trouble getting your medication? Please call [phone] or you can go to Medicare.gov    As part of the CARES act, starting April 1st some results are released to you automatically. Your provider will continue to send you a detailed result note on any labs that they order, but with these changes you may see your results before they do. Critical lab results will be addressed immediately, but otherwise please give your provider 72 hours (3 business days) to view and respond to your results before reaching out with any questions. Depending on your questions, they may ask you to schedule a telehealth or telephone visit to discuss further. This visit may be billed to your insurance depending on time and complexity.

## 2023-07-07 ENCOUNTER — Encounter: Admit: 2023-07-07 | Discharge: 2023-07-07 | Payer: MEDICARE

## 2023-07-07 ENCOUNTER — Ambulatory Visit: Admit: 2023-07-07 | Discharge: 2023-07-07 | Payer: MEDICARE

## 2023-07-07 ENCOUNTER — Ambulatory Visit: Admit: 2023-07-07 | Discharge: 2023-07-08 | Payer: MEDICARE

## 2023-07-07 DIAGNOSIS — M5416 Radiculopathy, lumbar region: Secondary | ICD-10-CM

## 2023-07-07 MED ORDER — DEXAMETHASONE SODIUM PHOS (PF) 10 MG/ML IJ EPIDURAL SOLN
10 mg | Freq: Once | EPIDURAL | 0 refills | Status: CP
Start: 2023-07-07 — End: ?

## 2023-07-07 MED ORDER — LIDOCAINE (PF) 10 MG/ML (1 %) IJ SOLN
2 mL | Freq: Once | INTRAMUSCULAR | 0 refills | Status: CP
Start: 2023-07-07 — End: ?

## 2023-07-07 MED ORDER — IOHEXOL 300 MG IODINE/ML IV SOLN
1 mL | Freq: Once | 0 refills | Status: CP
Start: 2023-07-07 — End: ?

## 2023-07-07 NOTE — Progress Notes
 SPINE CENTER  INTERVENTIONAL PAIN PROCEDURE HISTORY AND PHYSICAL    No chief complaint on file.      HISTORY OF PRESENT ILLNESS:  Cindy Randolph is a 63 y.o. year old female who presents for injection.  Denies fevers, chills, or recent hospitalizations.  Patient denies currently taking blood thinning medications.       Past Medical History:    Accidental fall    Acquired hypothyroidism    Actinic keratosis    Adverse drug reaction    Allergy    Anxiety    Aortic aneurysm (HCC)    Ashkenazi Jewish ancestry requiring population-specific genetic screening    Back pain    Breast cancer in female [facility])    Cancer of thyroid (HCC)    Chest pain    Coronary artery disease    Coronary atherosclerosis    Degenerative disc disease, cervical    Degenerative disc disease, lumbar    Degenerative disc disease, thoracic    Depressive disorder, not elsewhere classified    Dizziness    Esophageal stricture    Essential hypertension    Family history of coronary artery disease in brother    Fibromyalgia    Genetic testing    GERD (gastroesophageal reflux disease)    H/O total thyroidectomy    Hashimoto's thyroiditis    Heart murmur    Hiatal hernia    High cholesterol    History of bilateral mastectomy    History of external beam radiation therapy    History of thyroid cancer    Hormone replacement therapy    Hx antineoplastic chemo    Hyperparathyroidism (HCC)    Hyperthyroidism    Hyponatremia    Hypothyroid    Incontinence    Infection    Joint pain    Limb alert care status    Low vitamin D level    Migraine    Mild mitral and aortic regurgitation    Osteoporosis    Other and unspecified hyperlipidemia    Other malignant neoplasm without specification of site    Peripheral neuropathy    Personal history of irradiation    Postmenopausal    PUD (peptic ulcer disease)    Seasonal allergic reaction    Sleep apnea    Spinal headache    Spinal stenosis    Thyroid nodule    Torn meniscus    Ulcer    Unspecified deficiency anemia Surgical History:   Procedure Laterality Date    HX WRIST FRACTURE SURGERY  1993    Baker's thumb with fixation    ARTHROPLASTY  2001    L ACL    COLONOSCOPY  2017    normal    ARTHROSCOPY KNEE WITH PARTIAL LATERAL MENISCECTOMY AND LEFT KNEE INJECTION. Right 01/08/2021    Performed by Tanja Port, MD at [facility] OR    BILATERAL TOTAL MASTECTOMIES Bilateral 01/22/2021    Performed by Massie Kluver, MD at [facility] OR    INTRAOPERATIVE SENTINEL LYMPH NODE IDENTIFICATION WITH/ WITHOUT NON-RADIOACTIVE DYE INJECTION Left 01/22/2021    Performed by Massie Kluver, MD at [facility] OR    INJECTION RADIOACTIVE TRACER FOR SENTINEL NODE IDENTIFICATION Left 01/22/2021    Performed by Massie Kluver, MD at [facility] OR    LEFT AXILLARY SENTINEL LYMPH NODE BIOPSY Left 01/22/2021    Performed by Massie Kluver, MD at [facility] OR    BILATERAL CHEST FLAT CLOSURE Bilateral 01/22/2021    Performed by Gailen Shelter  M, MD at [facility] OR    BILATERAL CHEST FLAT CLOSURE x 8 Bilateral 01/22/2021    Performed by Stevenson Clinch, MD at [facility] OR    EXCISION BENIGN LESION 0.5 CM OR LESS - TORSO Right 01/22/2021    Performed by Stevenson Clinch, MD at [facility] OR    TUNNELED VENOUS PORT PLACEMENT Right 02/19/2021    For chemo; Removed a few months later    Placement of port-a-cath - 28F Right 02/19/2021    Performed by Freund, Alecia Lemming., MD at [facility]    FLUOROSCOPIC GUIDANCE CENTRAL VENOUS ACCESS DEVICE PLACEMENT/ REPLACEMENT/ REMOVAL N/A 02/19/2021    Performed by Carloyn Manner., MD at [facility] OR    Left Completion Axillary Lymph Node Dissection Left 05/14/2021    Performed by Massie Kluver, MD at [facility] OR    REMOVAL TUNNELED CENTRAL VENOUS ACCESS DEVICE INCLUDING PORT/ PUMP Right 05/14/2021    Performed by Massie Kluver, MD at [facility] OR    COLONOSCOPY DIAGNOSTIC WITH SPECIMEN COLLECTION BY BRUSHING/ WASHING - FLEXIBLE N/A 10/14/2021    Performed by Benetta Spar, MD at [facility]    ESOPHAGOGASTRODUODENOSCOPY WITH SPECIMEN COLLECTION BY BRUSHING/ WASHING N/A 06/17/2022    Performed by Remigio Eisenmenger, MD at [facility] ENDO    ESOPHAGOGASTRODUODENOSCOPY WITH DILATION ESOPHAGUS WITH BALLOON 30 MM OR GREATER - FLEXIBLE N/A 06/17/2022    Performed by Remigio Eisenmenger, MD at [facility] ENDO    ANGIOGRAPHY CORONARY ARTERY WITH LEFT HEART CATHETERIZATION N/A 07/16/2022    Performed by Harley Alto, MD at [facility] CATH LAB    PERCUTANEOUS CORONARY STENT PLACEMENT WITH ANGIOPLASTY N/A 07/16/2022    Performed by Harley Alto, MD at [facility] CATH LAB    ESOPHAGEAL MOTILITY STUDY N/A 02/08/2023    Performed by Jolee Ewing, MD at [facility] ENDO    ESOPHAGOGASTRODUODENOSCOPY WITH BIOPSY - FLEXIBLE N/A 02/08/2023    Performed by Jolee Ewing, MD at [facility] ENDO    GASTROESOPHAGEAL REFLUX TEST WITH MUCOSAL ATTACHED TELEMETRY PH ELECTE N/A 02/08/2023    Performed by Jolee Ewing, MD at [facility] ENDO    ESOPHAGOGASTRODUODENOSCOPY WITH DILATION GASTRIC/ DUODENAL STRICTURE - FLEXIBLE N/A 02/08/2023    Performed by Jolee Ewing, MD at [facility] ENDO    LAPAROSCOPIC REPAIR PARAESOPHAGEAL HERNIA WITHOUT FUNDOPLASTY N/A 06/15/2023    Performed by Bufford Lope, MD at [facility] OR    LAPAROSCOPIC REPAIR PARAESOPHAGEAL HERNIA WITH FUNDOPLASTYUSING TIF DEVICE N/A 06/15/2023    Performed by Jolee Ewing, MD at [facility] OR    ESOPHAGOGASTRODUODENOSCOPY WITH SPECIMEN COLLECTION BY BRUSHING/ WASHING N/A 06/15/2023    Performed by Jolee Ewing, MD at [facility] OR    ACL RECONSTRUCTION  06/03/1999    BREAST CYST ASPIRATION  Dec 2000    Neg for cancer, left breast cyst    BREAST SURGERY  01/22/2021    Bilat mastectomy    CARDIAC CATHERIZATION  07/16/2022    Dr. Atha Starks, no stent    CARDIOVASCULAR STRESS TEST  Feb 2024    At [facility]    ECHOCARDIOGRAM PROCEDURE  2022    See oncology history    ELECTROCARDIOGRAM  06/17/22    Most recent 12 lead done post egd    EVENT MONITOR  February 2024    FRACTURE SURGERY  1993    ORIF L 1st metacarpal    HX BREAST BIOPSY  2022    See [facility] records positive for lobular    HX CARPAL  TUNNEL RELEASE  1996    With second and other oregbnancies    HX EPISIOTOMY  1994    Healed well    HX MASTECTOMY  09/03/222    Bilateral; cancer in left breast    HX THYROIDECTOMY  1980    Parathyroids not removed    KNEE SURGERY  L ACL as above    LYMPH NODE DISSECTION  12/29/222    Left    PORTACATH PLACEMENT  02/05/2021    Date approximate; for chemo, right. Removed 04/2021    SPINE SURGERY  03/2023    MILD at [place], Dr. Shelia Media    THYROIDECTOMY  1980    UMBILICAL ARTERIAL CATH - BEDSIDE  February, 2024 at [place]       family history includes Alcohol abuse in her father; Arthritis in her mother and paternal grandmother; Arthritis-osteo in her paternal grandmother; Asthma in her brother; Back pain in her brother, brother, brother, mother, and paternal grandmother; Basal Cell Carcinoma in her brother, brother, and brother; Birth Defect in her daughter and nephew; Cancer in her brother, mother, and other family members; Cancer-Breast (age of onset: 109) in her mother; Essie Christine (age of onset: 80) in her maternal great-aunt; Cancer-Skin in her brother; Coronary Artery Disease in her father; Diabetes in her brother, brother, brother, father, maternal grandfather, paternal grandfather, and paternal grandmother; Early Death in her father; Heart Disease in her father; Heart problem in her brother and father; High Cholesterol in her brother; Hypertension in her brother, brother, brother, father, maternal grandfather, and mother; Joint Pain in her mother; Miscarriage in her daughter and mother; Miscarriages in her mother; Neck Pain in her mother; Stroke in her brother and maternal uncle; Sudden Cardiac Death in her father; Thyroid Disease in her maternal uncle, maternal uncle and another family member.    Social History     Socioeconomic History    Marital status: Married    Number of children: 8   Occupational History    Occupation: Retired Engineer, civil (consulting)     Comment: Worked in Multimedia programmer; also worked in the ambulatory setting   Tobacco Use    Smoking status: Never     Passive exposure: Past    Smokeless tobacco: Never    Tobacco comments:     Significant second hand smoke from parents 79-51 years old   Vaping Use    Vaping status: Never Used   Substance and Sexual Activity    Alcohol use: Not Currently     Comment: Prior to 2020, socially, one drink once a week at most    Drug use: Never     Comment: in late teens, early twenties, a few times and not since    Sexual activity: Not Currently     Partners: Male     Birth control/protection: Post-menopausal, None       Allergies   Allergen Reactions    Mango ANAPHYLAXIS    Other [Unclassified Drug] ANAPHYLAXIS     DUCK Meat and Eggs     Imdur [Isosorbide Mononitrate] CHEST TIGHTNESS and RASH    Oxycodone NAUSEA ONLY     Prefers tramadol    Sudafed [Pseudoephedrine Hcl] PALPITATIONS       There were no vitals filed for this visit.          REVIEW OF SYSTEMS: 10 point ROS obtained and negative except back pain      PHYSICAL EXAM:  General: 63 y.o. female appears stated age, in no acute  distress  HEENT: Normocephalic, atraumatic  Neck: No thyroidmegaly  Cardiovascular: Well perfused  Pulmonary: Unlabored respirations  Extremities: No cyanosis, clubbing, or edema  Skin: No lesions seen on exposed skin  Psychiatric:  Appropriate mood and affect  Musculoskeletal: No atrophy.   Neurologic: Antigravity strength in all extremities. CN II -XII grossly intact.  Alert and oriented x 3.           IMPRESSION:    1. Left lumbar radiculitis         PLAN: Lumbar Transforaminal Epidural Steroid Injection LEFT L3-4, L4-5

## 2023-07-07 NOTE — Procedures
 Attending Surgeon: Lizbeth Bark, MD    Anesthesia: Local    Pre-Procedure Diagnosis:   1. Left lumbar radiculitis        Post-Procedure Diagnosis:   1. Left lumbar radiculitis        Transforaminal Lumbar/Sacral  Procedure: transforaminal epidural    Laterality: left   on 07/07/2023 12:30 PM  Location: lumbar - L4-5 and L3-4      Consent:   Consent obtained: verbal and written  Consent given by: patient  Risks discussed: no change or worsening in pain, weakness, nerve damage, swelling, seizure, infection, bruising, reaction to medication, bleeding, pneumo thorax and allergic reaction  Alternatives discussed: alternative treatment  Discussed with patient the purpose of the treatment/procedure, other ways of treating my condition, including no treatment/ procedure and the risks and benefits of the alternatives. Patient has decided to proceed with treatment/procedure.        Universal Protocol:  Relevant documents: relevant documents present and verified  Test results: test results available and properly labeled  Imaging studies: imaging studies available  Required items: required blood products, implants, devices, and special equipment available  Site marked: the operative site was marked  Patient identity confirmed: Patient identify confirmed verbally with patient.        Time out: Immediately prior to procedure a time out was called to verify the correct patient, procedure, equipment, support staff and site/side marked as required      Procedures Details:   Indications: pain   Prep: chlorhexidine  Patient position: prone  Number of Levels: 2  Approach: paramedian  Guidance: fluoroscopy  Contrast: Procedure confirmed with contrast under live fluoroscopy.  Needle and Epidural Catheter: tuohy  Needle size: 25 G  Injection procedure: Negative aspiration for blood  Patient tolerance: Patient tolerated the procedure well with no immediate complications. Pressure was applied, and hemostasis was accomplished.  Comments: Indications:Patient presents with a diagnosis of radiculopathy. The patient's history and physical exam were reviewed. The risks, benefits and alternatives to the procedure were discussed, and all questions were answered to the patient's satisfaction. The patient agreed to proceed, and written informed consent was obtained.     Procedure in Detail: IV was started? No    The patient was brought into the procedure room and placed in the prone position on the fluoroscopy table. Standard monitors were placed, and vital signs were observed throughout the procedure. The area of the lumbar spine was prepped with Chloroprep and draped in a sterile manner. ?    The left L4-L5 vertebral bodies were identified with AP fluoroscopy. An oblique view to the right was obtained to better visualize the inferior junction of the pedicle and transverse process. The 6 o'clock position of the pedicle was marked and identified. The skin and subcutaneous tissues in the area were anesthetized with 1% lidocaine. A 25-gauge, 3.5 inch needle was directed toward the targeted point under fluoroscopy until bone was contacted. The needle was then walked inferiorly until the neural foramen was entered. A lateral fluoroscopic view was then used to place the needle tip at the 10 o'clock position of the foramen.     The left L3-L4 vertebral bodies were identified with AP fluoroscopy. An oblique view to the right was obtained to better visualize the inferior junction of the pedicle and transverse process. The 6 o'clock position of the pedicle was marked and identified. The skin and subcutaneous tissues in the area were anesthetized with 1% lidocaine. A 25-gauge, 3.5 inch needle was directed toward  the targeted point under fluoroscopy until bone was contacted. The needle was then walked inferiorly until the neural foramen was entered. A lateral fluoroscopic view was then used to place the needle tip at the 10 o'clock position of the foramen.     Negative aspiration was confirmed, and 1ml of contrast was injected at each level. Appropriate neurograms were observed under live AP fluoroscopy with no noted vascular or intrathecal uptake. Then, after negative aspiration, a solution consisting of 4 mL 1% lidocaine and ?10 (steroid) mg dexamethasone with 2.5 mL of solution easily injected at each level. The needles were removed with a 1% lidocaine flush. The patient's back was cleaned and a bandage was placed over the needle insertion points.    The same procedure was performed on the opposite side? No    Disposition: The patient tolerated the procedure well, and there were no apparent complications. Vital signs remained stable througtout the procedure. The patient was taken to the recovery area where discharge instructions for the procedure were given.     Estimated Blood Loss: minimal    Specimens: none    Complications: none      This patient's clinical history, exam, AND imaging support radiculopathy AND there is a significant impact on quality of life and function AND the pain has been present for at least 4 weeks AND they have failed to improve with noninvasive conservative care.  This patient had at least 50% pain relief for at least 3 months with the last epidural injection.    This patient's pain is so severe it results in a significant degree of disability. Prior ESI has provided at least a 50% improvement in pain and function for at least 3 months. The patient's Primary Care Physician has been notified of the continuation of this procedure and prolonged repeat steroid use. The patient is not a surgical candidate.  Estimated blood loss: none or minimal  Specimens: none  Patient tolerated the procedure well with no immediate complications. Pressure was applied, and hemostasis was accomplished.  Administrations This Visit       dexamethasone PF (DECADRON) epidural injection 10 mg       Admin Date  07/07/2023 Action  Given Dose  10 mg Route  Epidural Documented By  Ledora Bottcher, RN              iohexoL (OMNIPAQUE-300) 300 mg/mL injection 1 mL       Admin Date  07/07/2023 Action  Given Dose  1 mL Route  SEE ADMIN INSTRUCTIONS Documented By  Ledora Bottcher, RN              lidocaine PF 1% (10 mg/mL) injection 2 mL       Admin Date  07/07/2023 Action  Given Dose  2 mL Route  Injection Documented By  Ledora Bottcher, RN

## 2023-07-07 NOTE — Discharge Instructions - Supplementary Instructions
GENERAL POST PROCEDURE INSTRUCTIONS  Physician: _________________________________  Procedure Completed Today:  Joint Injection (hip, knee, shoulder)  Cervical Epidural Steroid Injection  Cervical Transforaminal Steroid Injection  Trigger Point Injection  Caudal Epidural Steroid Injection  Piriformis Injection  Pudendal Nerve Block  Other _____________________ Thoracic Epidural Steroid Injection  Lumbar Epidural Steroid Injection  Lumbar Transforaminal Steroid Injection  Facet Joint Injection  Celiac Nerve Block  Sacrococcygeal  Sacroiliac Joint Injection   Important information following your procedure today:  You may drive today     If you had sedation, you may NOT drive today  Rest at home for the next 6 hours.  You may then begin to resume your normal activities.  DO NOT drive any vehicle, operate any power tools, drink alcohol, make any major decisions, or sign any legal documents for the next 12 hours.  Pain relief may not be immediate. It is possible you may even experience an increase in pain during the first 24-48 hours followed by a gradual decrease of your pain.  Though the procedure is generally safe, and complications are rare, we do ask that you be aware of any of the following:  Any swelling, persistent redness, new bleeding or drainage from the site of the injection.  You should not experience a severe headache.  You should not run a fever over 101oF.  New onset of sharp, severe back and or neck pain.  New onset of upper or lower extremity numbness or weakness.  New difficulty controlling bowel or bladder function after injection.  New shortness of breath.  ** If any of these occur, please call to report this occurrence to the nurse of Dr. Noralyn Pick at [phone]. If you are calling after 4:00 p.m. or on weekends or holidays, please call [phone] and ask to have the resident physician on call for the physician paged or go to your local emergency room.  You may experience soreness at the injection site. Ice can be applied at 20-minute intervals for the first 24 hours. The following day you may alternate ice with heat if you are experiencing muscle tightness, otherwise continue with ice. Ice works best at decreasing pain. Avoid application of direct heat, hot showers or hot tubs today.  Avoid strenuous activity today. You many resume your regular activities and exercise tomorrow.  Patients with diabetes may see an elevation in blood sugars for 7-10 days after the injection. It is important to pay close attention to your diet, check your blood sugars daily and report extreme elevations to the physician that manages your diabetes.  Patients taking daily blood thinners can resume their regular dose this evening.  It is important that you take all medications ordered by your pain physician. Taking medications as ordered is an important part of your pain care plan. If you cannot continue the medication plan, please notify the physician.    Possible side effects to steroids that may occur:  Flushing or redness of the face  Irritability  Fluid retention  Change in women's menses  Minor headache    If you are unable to keep your upcoming appointment, please notify the [facility] scheduler at [phone] at least 24 hours in advance. If you have questions for the surgery center, call [facility] at [phone].

## 2023-07-08 ENCOUNTER — Encounter: Admit: 2023-07-08 | Discharge: 2023-07-08 | Payer: MEDICARE

## 2023-07-11 ENCOUNTER — Encounter: Admit: 2023-07-11 | Discharge: 2023-07-11 | Payer: MEDICARE

## 2023-07-11 NOTE — Telephone Encounter
 MOHS SURGERY PRE-OP PHONE CONSULTATION:    Preoperative phone consultation completed for upcoming Mohs surgery with Dr. Lowell Guitar on 07/20/2023.    I called the patient and reviewed with the patient what Mohs surgery is and what the day of the procedure/days following the procedure will look like. Informed the patient that he/she may drive to and from the procedure and can eat breakfast prior and take all medications as prescribed. Instructed to bring book/magazine or something to work on or entertain themselves as procedure can take several hours (advised they should plan to be in our office all day (ie: 8 to 10 hours). Okay to bring family member or friend. I answered all patient questions and concerns. Left patient with direct nurse line for patient to call back if any future questions or concerns arise.    Diagnosis: Basal cell carcinoma (nodular)  Site: Left cheek    Coordinated closure: No  If coordinated closure needed: specialty, physician, and date of closure: N/A  History of Mohs previously: No  Bleeding disorder: No  Use of blood thinners: Yes - ASA 81 mg, NSAIDs as needed/pt states she has been continuing to hold both her ASA and NSAIDs d/t another recent/previous surgery she had completed.   Pacemaker/Defibrillator: No  History of joint replacement in the last 2 years and/or heart valve replacement: No  Are all allergies on file: Yes  Are all medications on file: Yes  Does patient live in SNF: No  Is the patient able to sign consent for him/herself: Yes  Accommodations needed: No  If accommodations needed, type of accommodations: N/A  Additional notes: None     CBC w diff    Lab Results   Component Value Date/Time    WBC 7.10 07/01/2023 12:47 PM    RBC 4.07 07/01/2023 12:47 PM    HGB 12.5 07/01/2023 12:47 PM    HCT 38.9 07/01/2023 12:47 PM    MCV 95.5 07/01/2023 12:47 PM    MCH 30.8 07/01/2023 12:47 PM    MCHC 32.2 07/01/2023 12:47 PM    RDW 13.3 07/01/2023 12:47 PM    PLTCT 243 07/01/2023 12:47 PM    MPV 9.1 07/01/2023 12:47 PM    Lab Results   Component Value Date/Time    NEUT 58 06/14/2023 12:03 PM    ANC 3.60 06/14/2023 12:03 PM    LYMA 24 06/14/2023 12:03 PM    ALC 1.50 06/14/2023 12:03 PM    MONA 13 (H) 06/14/2023 12:03 PM    AMC 0.80 06/14/2023 12:03 PM    EOSA 4 06/14/2023 12:03 PM    AEC 0.20 06/14/2023 12:03 PM    BASA 1 06/14/2023 12:03 PM    ABC 0.00 06/14/2023 12:03 PM        Theodis Blaze, RN

## 2023-07-12 ENCOUNTER — Ambulatory Visit: Admit: 2023-07-12 | Discharge: 2023-07-13 | Payer: MEDICARE

## 2023-07-12 ENCOUNTER — Encounter: Admit: 2023-07-12 | Discharge: 2023-07-12 | Payer: MEDICARE

## 2023-07-13 ENCOUNTER — Encounter: Admit: 2023-07-13 | Discharge: 2023-07-13 | Payer: MEDICARE

## 2023-07-13 NOTE — Telephone Encounter
 Kristy from [employer] is asking for Appt notes from 05/23/23 for insurance to continue paying for CPAP supplies. Fax [phone]. Call back  [facility] [phone] prompt for respiratory, then LVM for Kristy  Done. Vivi Barrack, RN

## 2023-07-14 ENCOUNTER — Encounter: Admit: 2023-07-14 | Discharge: 2023-07-14 | Payer: MEDICARE

## 2023-07-14 ENCOUNTER — Ambulatory Visit: Admit: 2023-07-14 | Discharge: 2023-07-15 | Payer: MEDICARE

## 2023-07-14 ENCOUNTER — Ambulatory Visit: Admit: 2023-07-14 | Discharge: 2023-07-14 | Payer: MEDICARE

## 2023-07-14 DIAGNOSIS — M5416 Radiculopathy, lumbar region: Secondary | ICD-10-CM

## 2023-07-14 DIAGNOSIS — M48061 Spinal stenosis, lumbar region without neurogenic claudication: Secondary | ICD-10-CM

## 2023-07-14 DIAGNOSIS — M5136 Lumbar discogenic pain syndrome: Secondary | ICD-10-CM

## 2023-07-14 MED ORDER — LIDOCAINE (PF) 10 MG/ML (1 %) IJ SOLN
2 mL | Freq: Once | INTRAMUSCULAR | 0 refills | Status: CP
Start: 2023-07-14 — End: ?

## 2023-07-14 MED ORDER — IOHEXOL 300 MG IODINE/ML IV SOLN
1 mL | Freq: Once | 0 refills | Status: CP
Start: 2023-07-14 — End: ?

## 2023-07-14 MED ORDER — DEXAMETHASONE SODIUM PHOS (PF) 10 MG/ML IJ EPIDURAL SOLN
10 mg | Freq: Once | EPIDURAL | 0 refills | Status: CP
Start: 2023-07-14 — End: ?

## 2023-07-14 NOTE — Discharge Instructions - Supplementary Instructions
 GENERAL POST PROCEDURE INSTRUCTIONS  Physician: _________________________________  Procedure Completed Today:  Joint Injection (hip, knee, shoulder)  Cervical Epidural Steroid Injection  Cervical Transforaminal Steroid Injection  Trigger Point Injection  Caudal Epidural Steroid Injection  Piriformis Injection  Pudendal Nerve Block  Other _____________________ Thoracic Epidural Steroid Injection  Lumbar Epidural Steroid Injection  Lumbar Transforaminal Steroid Injection  Facet Joint Injection  Celiac Nerve Block  Sacrococcygeal  Sacroiliac Joint Injection   Important information following your procedure today:  You may drive today     If you had sedation, you may NOT drive today  Rest at home for the next 6 hours.  You may then begin to resume your normal activities.  DO NOT drive any vehicle, operate any power tools, drink alcohol, make any major decisions, or sign any legal documents for the next 12 hours.  Pain relief may not be immediate. It is possible you may even experience an increase in pain during the first 24-48 hours followed by a gradual decrease of your pain.  Though the procedure is generally safe, and complications are rare, we do ask that you be aware of any of the following:  Any swelling, persistent redness, new bleeding or drainage from the site of the injection.  You should not experience a severe headache.  You should not run a fever over 101oF.  New onset of sharp, severe back and or neck pain.  New onset of upper or lower extremity numbness or weakness.  New difficulty controlling bowel or bladder function after injection.  New shortness of breath.  ** If any of these occur, please call to report this occurrence to the nurse of Dr. Noralyn Pick at [phone]. If you are calling after 4:00 p.m. or on weekends or holidays, please call [phone] and ask to have the resident physician on call for the physician paged or go to your local emergency room.  You may experience soreness at the injection site. Ice can be applied at 20-minute intervals for the first 24 hours. The following day you may alternate ice with heat if you are experiencing muscle tightness, otherwise continue with ice. Ice works best at decreasing pain. Avoid application of direct heat, hot showers or hot tubs today.  Avoid strenuous activity today. You many resume your regular activities and exercise tomorrow.  Patients with diabetes may see an elevation in blood sugars for 7-10 days after the injection. It is important to pay close attention to your diet, check your blood sugars daily and report extreme elevations to the physician that manages your diabetes.  Patients taking daily blood thinners can resume their regular dose this evening.  It is important that you take all medications ordered by your pain physician. Taking medications as ordered is an important part of your pain care plan. If you cannot continue the medication plan, please notify the physician.    Possible side effects to steroids that may occur:  Flushing or redness of the face  Irritability  Fluid retention  Change in women's menses  Minor headache    If you are unable to keep your upcoming appointment, please notify the [facility] scheduler at [phone] at least 24 hours in advance. If you have questions for the surgery center, call [facility] at [phone].

## 2023-07-14 NOTE — Procedures
 Attending Surgeon: Lizbeth Bark, MD    Anesthesia: Local    Pre-Procedure Diagnosis:   1. Neuroforaminal stenosis of lumbar spine    2. Left lumbar radiculitis    3. Lumbar discogenic pain syndrome        Post-Procedure Diagnosis:   1. Neuroforaminal stenosis of lumbar spine    2. Left lumbar radiculitis    3. Lumbar discogenic pain syndrome        Transforaminal Lumbar/Sacral  Procedure: transforaminal epidural    Laterality: right   on 07/14/2023 2:15 PM  Location: lumbar - L4-5 and L3-4      Consent:   Consent obtained: verbal and written  Consent given by: Cindy Randolph  Risks discussed: no change or worsening in pain, weakness, nerve damage, swelling, seizure, infection, bruising, reaction to medication, bleeding, pneumo thorax and allergic reaction  Alternatives discussed: alternative treatment  Discussed with Cindy Randolph the purpose of the treatment/procedure, other ways of treating my condition, including no treatment/ procedure and the risks and benefits of the alternatives. Cindy Randolph has decided to proceed with treatment/procedure.        Universal Protocol:  Relevant documents: relevant documents present and verified  Test results: test results available and properly labeled  Imaging studies: imaging studies available  Required items: required blood products, implants, devices, and special equipment available  Site marked: the operative site was marked  Cindy Randolph identity confirmed: Cindy Randolph identify confirmed verbally with Cindy Randolph.        Time out: Immediately prior to procedure a time out was called to verify the correct Cindy Randolph, procedure, equipment, support staff and site/side marked as required      Procedures Details:   Indications: pain   Prep: chlorhexidine  Cindy Randolph position: prone  Number of Levels: 2  Approach: paramedian  Guidance: fluoroscopy  Contrast: Procedure confirmed with contrast under live fluoroscopy.  Needle and Epidural Catheter: tuohy  Needle size: 25 G  Injection procedure: Negative aspiration for blood  Cindy Randolph tolerance: Cindy Randolph tolerated the procedure well with no immediate complications. Pressure was applied, and hemostasis was accomplished.  Comments: Indications:Cindy Randolph presents with a diagnosis of radiculopathy. The Cindy Randolph's history and physical exam were reviewed. The risks, benefits and alternatives to the procedure were discussed, and all questions were answered to the Cindy Randolph's satisfaction. The Cindy Randolph agreed to proceed, and written informed consent was obtained.     Procedure in Detail: IV was started? No    The Cindy Randolph was brought into the procedure room and placed in the prone position on the fluoroscopy table. Standard monitors were placed, and vital signs were observed throughout the procedure. The area of the lumbar spine was prepped with Chloroprep and draped in a sterile manner. ?    The right L4-L5 vertebral bodies were identified with AP fluoroscopy. An oblique view to the right was obtained to better visualize the inferior junction of the pedicle and transverse process. The 6 o'clock position of the pedicle was marked and identified. The skin and subcutaneous tissues in the area were anesthetized with 1% lidocaine. A 25-gauge, 3.5 inch needle was directed toward the targeted point under fluoroscopy until bone was contacted. The needle was then walked inferiorly until the neural foramen was entered. A lateral fluoroscopic view was then used to place the needle tip at the 10 o'clock position of the foramen.     The right L3-L4 vertebral bodies were identified with AP fluoroscopy. An oblique view to the right was obtained to better visualize the inferior junction of the pedicle and  transverse process. The 6 o'clock position of the pedicle was marked and identified. The skin and subcutaneous tissues in the area were anesthetized with 1% lidocaine. A 25-gauge, 3.5 inch needle was directed toward the targeted point under fluoroscopy until bone was contacted. The needle was then walked inferiorly until the neural foramen was entered. A lateral fluoroscopic view was then used to place the needle tip at the 10 o'clock position of the foramen.     Negative aspiration was confirmed, and 1ml of contrast was injected at each level. Appropriate neurograms were observed under live AP fluoroscopy with no noted vascular or intrathecal uptake. Then, after negative aspiration, a solution consisting of 4 mL 1% lidocaine and ?10 (steroid) mg dexamethasone with 2.5 mL of solution easily injected at each level. The needles were removed with a 1% lidocaine flush. The Cindy Randolph's back was cleaned and a bandage was placed over the needle insertion points.    The same procedure was performed on the opposite side? No    Disposition: The Cindy Randolph tolerated the procedure well, and there were no apparent complications. Vital signs remained stable througtout the procedure. The Cindy Randolph was taken to the recovery area where discharge instructions for the procedure were given.     Estimated Blood Loss: minimal    Specimens: none    Complications: none      This Cindy Randolph's clinical history, exam, AND imaging support radiculopathy AND there is a significant impact on quality of life and function AND the pain has been present for at least 4 weeks AND they have failed to improve with noninvasive conservative care.    This Cindy Randolph failed to have improvement of their pain with epidural injection. Given the severity of their pain, a repeat injection is being performed at least 14 days later with a different Approach.    Estimated blood loss: none or minimal  Specimens: none  Cindy Randolph tolerated the procedure well with no immediate complications. Pressure was applied, and hemostasis was accomplished.  Administrations This Visit       dexamethasone PF (DECADRON) epidural injection 10 mg       Admin Date  07/14/2023 Action  Given Dose  10 mg Route  Epidural Documented By  Lendon Ka, RN              iohexoL (OMNIPAQUE-300) 300 mg/mL injection 1 mL       Admin Date  07/14/2023 Action  Given Dose  1 mL Route  SEE ADMIN INSTRUCTIONS Documented By  Lendon Ka, RN              lidocaine PF 1% (10 mg/mL) injection 2 mL       Admin Date  07/14/2023 Action  Given Dose  2 mL Route  Injection Documented By  Lendon Ka, RN

## 2023-07-14 NOTE — Progress Notes
 SPINE CENTER  INTERVENTIONAL PAIN PROCEDURE HISTORY AND PHYSICAL    No chief complaint on file.      HISTORY OF PRESENT ILLNESS:  Cindy Randolph is a 63 y.o. year old female who presents for injection.  Denies fevers, chills, or recent hospitalizations.  Patient denies currently taking blood thinning medications.       Past Medical History:    Accidental fall    Acquired hypothyroidism    Actinic keratosis    Adverse drug reaction    Allergy    Anxiety    Aortic aneurysm (HCC)    Ashkenazi Jewish ancestry requiring population-specific genetic screening    Back pain    Breast cancer in female [facility])    Cancer of thyroid (HCC)    Chest pain    Coronary artery disease    Coronary atherosclerosis    Degenerative disc disease, cervical    Degenerative disc disease, lumbar    Degenerative disc disease, thoracic    Depressive disorder, not elsewhere classified    Dizziness    Esophageal stricture    Essential hypertension    Family history of coronary artery disease in brother    Fibromyalgia    Genetic testing    GERD (gastroesophageal reflux disease)    H/O total thyroidectomy    Hashimoto's thyroiditis    Heart murmur    Hiatal hernia    High cholesterol    History of bilateral mastectomy    History of external beam radiation therapy    History of thyroid cancer    Hormone replacement therapy    Hx antineoplastic chemo    Hyperparathyroidism (HCC)    Hyperthyroidism    Hyponatremia    Hypothyroid    Incontinence    Infection    Joint pain    Limb alert care status    Low vitamin D level    Migraine    Mild mitral and aortic regurgitation    Osteoporosis    Other and unspecified hyperlipidemia    Other malignant neoplasm without specification of site    Peripheral neuropathy    Personal history of irradiation    Postmenopausal    PUD (peptic ulcer disease)    Seasonal allergic reaction    Sleep apnea    Spinal headache    Spinal stenosis    Thyroid nodule    Torn meniscus    Ulcer    Unspecified deficiency anemia Surgical History:   Procedure Laterality Date    HX WRIST FRACTURE SURGERY  1993    Baker's thumb with fixation    ARTHROPLASTY  2001    L ACL    COLONOSCOPY  2017    normal    ARTHROSCOPY KNEE WITH PARTIAL LATERAL MENISCECTOMY AND LEFT KNEE INJECTION. Right 01/08/2021    Performed by Tanja Port, MD at [facility]    BILATERAL TOTAL MASTECTOMIES Bilateral 01/22/2021    Performed by Massie Kluver, MD at [facility] OR    INTRAOPERATIVE SENTINEL LYMPH NODE IDENTIFICATION WITH/ WITHOUT NON-RADIOACTIVE DYE INJECTION Left 01/22/2021    Performed by Massie Kluver, MD at [facility]    INJECTION RADIOACTIVE TRACER FOR SENTINEL NODE IDENTIFICATION Left 01/22/2021    Performed by Massie Kluver, MD at [facility]    LEFT AXILLARY SENTINEL LYMPH NODE BIOPSY Left 01/22/2021    Performed by Massie Kluver, MD at [facility]    BILATERAL CHEST FLAT CLOSURE Bilateral 01/22/2021    Performed by Gailen Shelter  M, MD at [facility] OR    BILATERAL CHEST FLAT CLOSURE x 8 Bilateral 01/22/2021    Performed by Stevenson Clinch, MD at [facility]    EXCISION BENIGN LESION 0.5 CM OR LESS - TORSO Right 01/22/2021    Performed by Stevenson Clinch, MD at [facility]    TUNNELED VENOUS PORT PLACEMENT Right 02/19/2021    For chemo; Removed a few months later    Placement of port-a-cath - 7F Right 02/19/2021    Performed by Freund, Alecia Lemming., MD at [facility]    FLUOROSCOPIC GUIDANCE CENTRAL VENOUS ACCESS DEVICE PLACEMENT/ REPLACEMENT/ REMOVAL N/A 02/19/2021    Performed by Carloyn Manner., MD at [facility] OR    Left Completion Axillary Lymph Node Dissection Left 05/14/2021    Performed by Massie Kluver, MD at [facility]    REMOVAL TUNNELED CENTRAL VENOUS ACCESS DEVICE INCLUDING PORT/ PUMP Right 05/14/2021    Performed by Massie Kluver, MD at [facility]    COLONOSCOPY DIAGNOSTIC WITH SPECIMEN COLLECTION BY BRUSHING/ WASHING - FLEXIBLE N/A 10/14/2021    Performed by Benetta Spar, MD at [facility]    ESOPHAGOGASTRODUODENOSCOPY WITH SPECIMEN COLLECTION BY BRUSHING/ WASHING N/A 06/17/2022    Performed by Remigio Eisenmenger, MD at [facility] ENDO    ESOPHAGOGASTRODUODENOSCOPY WITH DILATION ESOPHAGUS WITH BALLOON 30 MM OR GREATER - FLEXIBLE N/A 06/17/2022    Performed by Remigio Eisenmenger, MD at [facility] ENDO    ANGIOGRAPHY CORONARY ARTERY WITH LEFT HEART CATHETERIZATION N/A 07/16/2022    Performed by Harley Alto, MD at [facility] CATH LAB    PERCUTANEOUS CORONARY STENT PLACEMENT WITH ANGIOPLASTY N/A 07/16/2022    Performed by Harley Alto, MD at [facility] CATH LAB    ESOPHAGEAL MOTILITY STUDY N/A 02/08/2023    Performed by Jolee Ewing, MD at [facility] ENDO    ESOPHAGOGASTRODUODENOSCOPY WITH BIOPSY - FLEXIBLE N/A 02/08/2023    Performed by Jolee Ewing, MD at [facility] ENDO    GASTROESOPHAGEAL REFLUX TEST WITH MUCOSAL ATTACHED TELEMETRY PH ELECTE N/A 02/08/2023    Performed by Jolee Ewing, MD at [facility] ENDO    ESOPHAGOGASTRODUODENOSCOPY WITH DILATION GASTRIC/ DUODENAL STRICTURE - FLEXIBLE N/A 02/08/2023    Performed by Jolee Ewing, MD at [facility] ENDO    LAPAROSCOPIC REPAIR PARAESOPHAGEAL HERNIA WITHOUT FUNDOPLASTY N/A 06/15/2023    Performed by Bufford Lope, MD at [facility] OR    LAPAROSCOPIC REPAIR PARAESOPHAGEAL HERNIA WITH FUNDOPLASTYUSING TIF DEVICE N/A 06/15/2023    Performed by Jolee Ewing, MD at [facility] OR    ESOPHAGOGASTRODUODENOSCOPY WITH SPECIMEN COLLECTION BY BRUSHING/ WASHING N/A 06/15/2023    Performed by Jolee Ewing, MD at [facility] OR    ACL RECONSTRUCTION  06/03/1999    BREAST CYST ASPIRATION  Dec 2000    Neg for cancer, left breast cyst    BREAST SURGERY  01/22/2021    Bilat mastectomy    CARDIAC CATHERIZATION  07/16/2022    Dr. Atha Starks, no stent    CARDIOVASCULAR STRESS TEST  Feb 2024    At [facility]    ECHOCARDIOGRAM PROCEDURE  2022    See oncology history    ELECTROCARDIOGRAM  06/17/22    Most recent 12 lead done post egd    EVENT MONITOR  February 2024    FRACTURE SURGERY  1993    ORIF L 1st metacarpal    HX BREAST BIOPSY  2022    See  records positive for lobular    HX CARPAL  TUNNEL RELEASE  1996    With second and other oregbnancies    HX EPISIOTOMY  1994    Healed well    HX MASTECTOMY  09/03/222    Bilateral; cancer in left breast    HX THYROIDECTOMY  1980    Parathyroids not removed    KNEE SURGERY  L ACL as above    LYMPH NODE DISSECTION  12/29/222    Left    PORTACATH PLACEMENT  02/05/2021    Date approximate; for chemo, right. Removed 04/2021    SPINE SURGERY  03/2023    MILD at [place], Dr. Shelia Media    THYROIDECTOMY  1980    UMBILICAL ARTERIAL CATH - BEDSIDE  February, 2024 at [place]       family history includes Alcohol abuse in her father; Arthritis in her mother and paternal grandmother; Arthritis-osteo in her paternal grandmother; Asthma in her brother; Back pain in her brother, brother, brother, mother, and paternal grandmother; Basal Cell Carcinoma in her brother, brother, and brother; Birth Defect in her daughter and nephew; Cancer in her brother, mother, and other family members; Cancer-Breast (age of onset: 24) in her mother; Essie Christine (age of onset: 53) in her maternal great-aunt; Cancer-Skin in her brother; Coronary Artery Disease in her father; Diabetes in her brother, brother, brother, father, maternal grandfather, paternal grandfather, and paternal grandmother; Early Death in her father; Heart Disease in her father; Heart problem in her brother and father; High Cholesterol in her brother; Hypertension in her brother, brother, brother, father, maternal grandfather, and mother; Joint Pain in her mother; Miscarriage in her daughter and mother; Miscarriages in her mother; Neck Pain in her mother; Stroke in her brother and maternal uncle; Sudden Cardiac Death in her father; Thyroid Disease in her maternal uncle, maternal uncle and another family member.    Social History     Socioeconomic History    Marital status: Married    Number of children: 8   Occupational History    Occupation: Retired Engineer, civil (consulting)     Comment: Worked in Multimedia programmer; also worked in the ambulatory setting   Tobacco Use    Smoking status: Never     Passive exposure: Past    Smokeless tobacco: Never    Tobacco comments:     Significant second hand smoke from parents 24-28 years old   Vaping Use    Vaping status: Never Used   Substance and Sexual Activity    Alcohol use: Not Currently     Comment: Prior to 2020, socially, one drink once a week at most    Drug use: Never     Comment: in late teens, early twenties, a few times and not since    Sexual activity: Not Currently     Partners: Male     Birth control/protection: Post-menopausal, None       Allergies   Allergen Reactions    Mango ANAPHYLAXIS    Other [Unclassified Drug] ANAPHYLAXIS     DUCK Meat and Eggs     Imdur [Isosorbide Mononitrate] CHEST TIGHTNESS and RASH    Oxycodone NAUSEA ONLY     Prefers tramadol    Sudafed [Pseudoephedrine Hcl] PALPITATIONS       There were no vitals filed for this visit.     Oswestry Total Score:: (Patient-Rptd) 48    REVIEW OF SYSTEMS: 10 point ROS obtained and negative except back pain      PHYSICAL EXAM:  General: 63 y.o. female appears stated age,  in no acute distress  HEENT: Normocephalic, atraumatic  Neck: No thyroidmegaly  Cardiovascular: Well perfused  Pulmonary: Unlabored respirations  Extremities: No cyanosis, clubbing, or edema  Skin: No lesions seen on exposed skin  Psychiatric:  Appropriate mood and affect  Musculoskeletal: No atrophy.   Neurologic: Antigravity strength in all extremities. CN II -XII grossly intact.  Alert and oriented x 3.           IMPRESSION:    1. Left lumbar radiculitis    2. Lumbar discogenic pain syndrome    3. Neuroforaminal stenosis of lumbar spine         PLAN: Lumbar Transforaminal Epidural Steroid Injection RIGHT L3-4, L4-5

## 2023-07-18 ENCOUNTER — Encounter: Admit: 2023-07-18 | Discharge: 2023-07-18 | Payer: MEDICARE

## 2023-07-19 ENCOUNTER — Encounter: Admit: 2023-07-19 | Discharge: 2023-07-19 | Payer: MEDICARE

## 2023-07-19 MED ORDER — DIAZEPAM 2 MG PO TAB
ORAL_TABLET | ORAL | 0 refills | 7.00000 days | Status: AC
Start: 2023-07-19 — End: ?

## 2023-07-20 ENCOUNTER — Encounter: Admit: 2023-07-20 | Discharge: 2023-07-20 | Payer: MEDICARE

## 2023-07-20 ENCOUNTER — Ambulatory Visit: Admit: 2023-07-20 | Discharge: 2023-07-20 | Payer: MEDICARE

## 2023-07-20 MED ORDER — LIDOCAINE-EPINEPHRINE 1 %-1:100,000 IJ SOLN
5 mL | Freq: Once | 0 refills | Status: CP
Start: 2023-07-20 — End: ?
  Administered 2023-07-20: 16:00:00 5 mL

## 2023-07-20 NOTE — Procedures
 Mohs Micrographic Surgery Operative Note    Patient Name: Cindy Randolph  Date of Birth: June 16, 1960    Procedure: Mohs micrographic surgery  Surgeon and Pathologist: Gloriann Loan, MD    Assistant Surgeon(s): [place] Montoya, MD  Referring MD: Zada Finders  Date of Service: 07/20/2023    A detailed discussion of the diagnosis, prognosis, and treatment options including Mohs micrographic surgery, wide local excision, radiation, destruction, and chemotherapy was performed. Based on my medical judgment, Mohs surgery is medically necessary because it is the most appropriate treatment for this cancer compared to other treatments. The rationale for Mohs surgery was explained to the patient and patient opted for Mohs surgery. The risks, benefits, and alternatives to treatment were discussed in detail. Specifically, the risks of pain, infection, swelling, bleeding, bruising, scarring, altered appearance, prolonged wound healing, incomplete removal, allergy to anesthesia, nerve injury, functional impairment, and recurrence discussed. Patient also informed that the skin cancer removal will result in a defect in the skin and soft tissue that may require repair such as a primary closure, flap, graft, or closure in the operating room in the future +/- hospitalization if needed. If the surgery reveals advanced disease, further resection in the operating room or adjuvant treatment with radiation and/or chemotherapy at a later date may be recommended.  All questions answered. Written and verbal informed consent obtained.    Prior to the procedure, a time-out was performed in which the patient's identity was verified and treatment site(s) were clearly identified and cleaned with alcohol prep pads, marked with surgical marker, and confirmed by the patient and surgeons.  All components of time-out protocol completed. Gloriann Loan, MD operated in two distinct and integrated capacities as the surgeon and pathologist.      no antibiotic prophylaxis necessary    Lesion A  Mohs Case Number:  Site:  Pre Op Dx:  Post Op Dx:  Tumor Recurrence Status:  PreOp SIze:  PostOp Size:  Number of Stages:  Repair Type(s):  Final Wound Length (cm)/ Area of Flap or Graft (cm2):  Anesthesia: local infiltration  Skin Prep:   [id number]  Left cheek   Basal cell carcinoma (nodular)  Basal cell carcinoma (nodular)  Primary  0.7 x 0.6 cm  1.1 x 0.8 cm  1  Primary (complex) repair  3.4 cm  Lidocaine 1.0% soln w epinephrine    Chlorhexidine        Total Anesthesia for stage(s) and/or repair (mL): 5 cc     Estimated Blood loss:  Minimal  Complications:  None    Indications for Mohs Micrographic Surgery:    The patient has a biopsy- proven Primary Basal cell carcinoma (nodular)  located on the left cheek     Removal of the patient's tumor is complicated by the following clinical features:    Ill defined margins, Involvement of sensitive cosmetic/functional structure, Suspected deep tissue invasion of tumor requiring margin evaluation, and Limited skin laxity    Stage 1:      The patient was reclined on the surgery table. The site was prepped with antiseptic, infiltrated with local anesthesia, and draped. The surgical field was again prepped with antiseptic. All visible tumor was completely debulked using a curette and/or scalpel. An excision was made 2-15mm around the debulking defect following standard Mohs technique. Hemostasis was achieved with spot electrocautery. Pressure dressing was applied. Tissue was carefully divided into number specimen(s) indicated below, which was oriented, color coded using ink, and mapped. The specimen was then given  to the technician for frozen sectioning, mounting, and staining using the Mohs protocol. Frozen section analysis showed residual tumor in number of specimens as indicated below.   Number of Specimen(s):  Number of positive specimen(s): 1  0 Microscopic examination of tissue revealed margins clear of tumor.     Repair Note: Complex Repair    Repair Type:  complex repair  Dermal and subcutaneous suture:  4-0 monocryl  Epidermal suture:  6-0 prolene  Estimated blood loss:  < 5mL  Complications:  None.  Patient was discharged in stable condition.    Indications:  This patient was left with a skin and soft tissue defect following Mohs surgery.  A variety of closure modalities were discussed with the patient and it was decided that a complex repair would best preserve normal anatomical and functional relationships.  Complex repair was performed for the following indications: extensive undermining was required, the inelastic skin made closure difficult, redundant tissue cones were required to avoid a deformity, to avoid disruption of free anatomic margins, to reduce tension, to reduce risk of skin necrosis, infection, and dehiscence, and to optimize functional and cosmetic results.  After discussing the risks including pain, bleeding, bruising, swelling, infection, scarring, altered appearance, contour deformity, necrosis, wound dehiscence, functional deficit, nerve damage and need for revision, informed consent was obtained and the patient underwent the procedure as follows:    Procedure:  The patient was reclined on the surgery table.  The surgical defect and surrounding skin were cleansed with antiseptic, infiltrated with local anesthesia, re-cleansed with antiseptic, and then prepped with sterile drapes.  Extensive wide undermining was performed with #15 blade and/or iris scissors. The undermined distance was greater than the greatest width of the defect along at least one entire length of the repair.  Extensive undermining to twice the width of the defect was performed to avoid distortion of the margin.   Defect skin edges were de-beveled.  Redundant tissue cones were removed in order to facilitate reconstruction in the patient's natural skin tension lines and to obtain maximum functional and cosmetic results. Hemostasis was achieved with spot electrocautery.  The dermis and subcutaneous tissue were closed with buried vertical mattress sutures.  The epidermis was carefully approximated using simple running sutures.  White petrolatum and pressure dressing applied.  Verbal and written wound care instructions were given.  RTC for suture removal and/or post-op check as directed. The patient was encouraged to follow up with the referring dermatologist.      Post-operative Plan:  Written and verbal wound care instructions reviewed  Advised long-term follow-up with referring dermatologist for skin cancer surveillance  Sun protection reviewed  Scar management reviewed    RTC as directed for suture removal and/or wound check

## 2023-07-21 ENCOUNTER — Encounter: Admit: 2023-07-21 | Discharge: 2023-07-21 | Payer: MEDICARE

## 2023-07-21 ENCOUNTER — Encounter: Admit: 2023-07-21 | Discharge: 2023-07-22 | Payer: MEDICARE

## 2023-07-21 DIAGNOSIS — C44319 Basal cell carcinoma of skin of other parts of face: Secondary | ICD-10-CM

## 2023-07-22 ENCOUNTER — Encounter: Admit: 2023-07-22 | Discharge: 2023-07-22 | Payer: MEDICARE

## 2023-07-23 ENCOUNTER — Ambulatory Visit: Admit: 2023-07-23 | Discharge: 2023-07-24 | Payer: MEDICARE

## 2023-07-23 ENCOUNTER — Encounter: Admit: 2023-07-23 | Discharge: 2023-07-23 | Payer: MEDICARE

## 2023-07-23 DIAGNOSIS — J029 Acute pharyngitis, unspecified: Secondary | ICD-10-CM

## 2023-07-23 DIAGNOSIS — J019 Acute sinusitis, unspecified: Secondary | ICD-10-CM

## 2023-07-23 MED ORDER — AMOXICILLIN-POT CLAVULANATE 400-57 MG/5 ML PO SUSR
800 mg | Freq: Two times a day (BID) | ORAL | 0 refills | 7.00000 days | Status: AC
Start: 2023-07-23 — End: ?

## 2023-07-23 NOTE — Patient Instructions
Causes of Sinusitis  Mucus helps keep your sinuses clean. But mucus may build up in the sinuses because of colds, allergies, or blockages. These things get in the way of the natural drainage of mucus. This may lead to sinusitis. Sinusitis means sinus inflammation and infection.   Acute sinusitis may come on suddenly. It often happens right after an upper respiratory infection, such as a cold. Viruses cause most acute sinus infections. But bacteria may also be the cause.  Chronic sinusitis is ongoing swelling of the sinus lining. Health experts don't fully understand what causes this.  Colds and other infections  A cold or flu may cause your sinus and nasal linings to swell. Sinus openings can become blocked. This causes mucus to back up. This backed-up mucus becomes an ideal place for bacteria to grow. Thick, yellow, or discolored mucus is one sign of infection.     Allergic reactions  You may be sensitive to certain substances. This causes the release of histamine in the body. Histamine makes your sinus and nasal linings swell. Long-term swelling clogs your sinuses. It prevents the tiny hairs (cilia) in the nasal lining from sweeping away mucus. Allergy symptoms can continue over time. But they?re less severe than with colds.   Blockages  A polyp is a sac of swollen tissue. It can be the result of an allergy or infection. It may block the opening where most of your sinuses drain (middle meatus). It may even grow large enough to block your nose.  A deviated septum is when the thin wall inside your nose is pushed to one side. It's often the result of injury. This can block your middle meatus.   People with chronic nasal problems or allergies are more likely to get acute sinusitis. Sinusitis is also more common if you have a weakened immune system, such as with HIV. You're also more likely to get sinusitis if you have cystic fibrosis or another condition that causes your body to make extra mucus.

## 2023-07-23 NOTE — Progress Notes
 Date of Service: 07/23/2023    Cindy Randolph is a 63 y.o. female.  DOB: November 06, 1960  MRN: [id number]     Subjective:             Chief Complaint   Patient presents with    Congestion     Pt complains of a sore throat, sinus congestion, cough, headache, and a low grade fever for 8 weeks now. Pt claims her symptoms are on and off.        History of Present Illness    Patient is a 63 year old female here for an urgent care visit.  She reports 8 week intermittent symptoms of sore throat, sinus congestion, headache, cough, and sinus pressure/pain.  The sinus pressure and pain is her worst symptom.  She reports thick green nasal drainage.  She is currently on Tylenol and Advil for, due to a recent Mohs procedure.  She is also using saline nasal spray, steroid nasal spray, Mucinex and a humidifier for symptoms. She would like a strep test.          Review of Systems   HENT:  Positive for sinus pressure and sinus pain.    Respiratory:  Positive for cough.    Neurological:  Positive for headaches.         Objective:          acetaminophen (TYLENOL) 160 mg/5 mL oral suspension Take 10.2 mL by mouth every 6 hours as needed. Indications: pain    acetaminophen (TYLENOL) 325 mg tablet Take two tablets by mouth every 6 hours. Take scheduled for 3 days after surgery, then as needed. Do not exceed 4,000mg  in a 24 hour period.    amLODIPine (NORVASC) 5 mg tablet Take one tablet by mouth daily.    amoxicillin/K clavulanate (AUGMENTIN) 400 mg/5 mL oral suspension Take 10 mL by mouth twice daily for 7 days. Take with food.    aspirin 81 mg chewable tablet Chew one tablet by mouth daily.    biotin 1 mg cap Take one capsule by mouth daily.    calcipotriene (DOVONEX) 0.005 % topical cream Apply twice daily to forehead and temple for 4 days.    calcium carbonate (OS-CAL) 1250 mg (500 mg elemental calcium) tablet Take one tablet by mouth daily.    CHOLEcalciferoL (vitamin D3) 5000 unit tablet Take one tablet by mouth daily. clobetasoL (TEMOVATE) 0.05 % topical ointment Apply  topically to affected area twice daily. Apply to areas of rash on hands twice daily; do NOT use on face/groin/underarms, use up to 2 weeks per month    diazePAM (VALIUM) 2 mg tablet Please bring to mohs appointment, must have a driver, also please do not take until consent is signed    diclofenac sodium (VOLTAREN) 1 % topical gel Apply four g topically to affected area daily as needed.    ezetimibe (ZETIA) 10 mg tablet Take one tablet by mouth daily.    Fish,Bora,Flax Oils-OM3,6,9 #1 (TRIPLE OMEGA) 400-400-400 mg capsule Take one capsule by mouth at bedtime daily.    lidocaine hcl viscous (LIDOCAINE VISCOUS) 2 % solution Swish and Swallow 5 mL by mouth as directed four times daily as needed. Indications: suppression of the gag reflex    metoprolol succinate XL (TOPROL XL) 25 mg extended release tablet Take one tablet by mouth at bedtime daily.    Miscellaneous [facility] [employer] misc DME requirements, rx to include the following typed information:    NPI: [id number]  Doctor's typed name: Lewayne Bunting,  Avie Echevaria, MD  ICD 10: G47.33  Length of need: 99 months  Start date:02/14/2023  Signature date: 02/14/2023  Detailed equipment: CPAP machine with any accessories.    naltrexone 1 mg oral capsule (BATCHED COMPOUND) Take 1 capsule by mouth daily for 1 week, THEN take 2 capsules by mouth daily x 1 week, THEN take 3 capsules daily x 1 week, THEN take 4 capsules daily thereafter.    omeprazole DR (PRILOSEC) 40 mg capsule Take one capsule by mouth daily. Open the capsule in apple juice for 2 months, then stop.  Indications: gastroesophageal reflux disease    ondansetron (ZOFRAN ODT) 4 mg rapid dissolve tablet Dissolve one tablet by mouth every 6 hours as needed for Nausea. Place on tongue to dissolve.  Indications: prevent nausea and vomiting after surgery    other medication Take one Dose by mouth daily. K Complete K1 & K2 as MK-4 & MK-7 : Take 1 softgel by mouth once daily    peg 3350-electrolytes (GOLYTELY) 236-22.74-6.74 -5.86 gram oral solution Mix as directed on package.Drink Slowly over 24 hours, you can eat and drink normally.    polyethylene glycol 3350 (MIRALAX) 17 g packet Take one packet by mouth daily. Increase as needed in order to have 1-2 soft bowel movements per day without straining.  Indications: constipation    promethazine (PROMETHEGAN) 12.5 mg rectal suppository Insert or Apply one suppository to rectal area as directed every 6 hours as needed for Nausea. Indications: prevent nausea and vomiting after surgery    rosuvastatin (CRESTOR) 10 mg tablet Take one tablet by mouth daily.    Selenium 100 mcg tab Take one tablet by mouth daily.    simethicone (MYLICON) 40 mg/0.6 mL drops Take 0.6 mL by mouth every 6 hours as needed. Indications: For gas pain    sodium,potassium,mag sulfates (SUPREP BOWEL PREP KIT) 17.5-3.13-1.6 gram oral solution Evening prior: mix 1 bottle with 16 ounces of water and drink orally, followed by 32 additional ounces of water.  Day of: mix 2nd bottle and drink orally, followed by 32 ounces of water. Complete the bowel preparation at least 2 hrs before procedure.    sucralfate (CARAFATE) 100 mg/mL oral suspension Take 10 mL by mouth every 6 hours. Take for 4 weeks following surgery.  Indications: stress ulcer prevention    SUMAtriptan succinate (IMITREX) 25 mg tablet Take one tablet by mouth at onset of headache. May repeat after 2 hours if needed. Max of 200 mg in 24 hours.    SYNTHROID 100 mcg tablet Take one tablet by mouth daily 30 minutes before breakfast.    tiZANidine (ZANAFLEX) 2 mg tablet Take one tablet by mouth twice daily as needed.    traMADoL (ULTRAM) 50 mg tablet Take one-half tablet to one tablet by mouth every 8 hours as needed for Pain. Indications: pain    vit A/vit C/vit E/zinc/copper (PRESERVISION AREDS PO) Take 1 tablet by mouth daily.    vitamins, B complex tab Take one tablet by mouth daily.    vitamins, multiple cap Take one capsule by mouth daily.     Vitals:    07/23/23 1224   BP: 124/89   BP Source: Arm, Right Upper   Pulse: 87   Temp: 98.2 ?F (36.8 ?C)   Resp: 18   SpO2: 98%   TempSrc: Temporal   PainSc: Four   Weight: 70.2 kg (154 lb 12.8 oz)   Height: 157.5 cm (5' 2)     Body mass index is 28.31  kg/m?.     Physical Exam  Vitals and nursing note reviewed.   Constitutional:       General: She is not in acute distress.     Appearance: She is well-developed. She is not ill-appearing, toxic-appearing or diaphoretic.   HENT:      Head: Normocephalic.      Right Ear: Tympanic membrane, ear canal and external ear normal.      Left Ear: Tympanic membrane, ear canal and external ear normal.      Nose:      Right Sinus: Maxillary sinus tenderness present.      Mouth/Throat:      Mouth: Mucous membranes are moist.      Pharynx: Uvula midline.      Tonsils: No tonsillar exudate. 0 on the right. 0 on the left.   Eyes:      General: Lids are normal.      Extraocular Movements: Extraocular movements intact.      Conjunctiva/sclera: Conjunctivae normal.   Cardiovascular:      Rate and Rhythm: Normal rate and regular rhythm.      Heart sounds: Normal heart sounds, S1 normal and S2 normal.   Pulmonary:      Effort: Pulmonary effort is normal. No accessory muscle usage, prolonged expiration or respiratory distress.      Breath sounds: Normal breath sounds.   Musculoskeletal:      Cervical back: Neck supple.   Lymphadenopathy:      Cervical: No cervical adenopathy.   Skin:     General: Skin is warm and dry.   Neurological:      Mental Status: She is alert and oriented to person, place, and time.   Psychiatric:         Attention and Perception: Attention normal.         Mood and Affect: Mood normal.         Speech: Speech normal.         Behavior: Behavior normal.         Thought Content: Thought content normal.         Cognition and Memory: Cognition normal.         Judgment: Judgment normal.            Assessment and Plan:  Ellender Hose. Donnita Farina was seen today for congestion.    Diagnoses and all orders for this visit:    Acute bacterial sinusitis  -     amoxicillin/K clavulanate (AUGMENTIN) 400 mg/5 mL oral suspension; Take 10 mL by mouth twice daily for 7 days. Take with food.    Sore throat  -     POC STREP A PCR       Results for orders placed or performed in visit on 07/23/23 (from the past 2 weeks)   POC STREP A PCR   Result Value Ref Range    Strep A, PCR POC Negative Negative    Strep A, PCR QC POC Pass      *Note: Due to a large number of results and/or encounters for the requested time period, some results have not been displayed. A complete set of results can be found in Results Review.           Advised f/u with with PCP or ENT if symptoms persist. She may also try daily allergy medications such as claritin or zyrtec.     Marland Kitchen  Patient Instructions   Causes of Sinusitis  Mucus helps keep your  sinuses clean. But mucus may build up in the sinuses because of colds, allergies, or blockages. These things get in the way of the natural drainage of mucus. This may lead to sinusitis. Sinusitis means sinus inflammation and infection.   Acute sinusitis may come on suddenly. It often happens right after an upper respiratory infection, such as a cold. Viruses cause most acute sinus infections. But bacteria may also be the cause.  Chronic sinusitis is ongoing swelling of the sinus lining. Health experts don't fully understand what causes this.  Colds and other infections  A cold or flu may cause your sinus and nasal linings to swell. Sinus openings can become blocked. This causes mucus to back up. This backed-up mucus becomes an ideal place for bacteria to grow. Thick, yellow, or discolored mucus is one sign of infection.     Allergic reactions  You may be sensitive to certain substances. This causes the release of histamine in the body. Histamine makes your sinus and nasal linings swell. Long-term swelling clogs your sinuses. It prevents the tiny hairs (cilia) in the nasal lining from sweeping away mucus. Allergy symptoms can continue over time. But they?re less severe than with colds.   Blockages  A polyp is a sac of swollen tissue. It can be the result of an allergy or infection. It may block the opening where most of your sinuses drain (middle meatus). It may even grow large enough to block your nose.  A deviated septum is when the thin wall inside your nose is pushed to one side. It's often the result of injury. This can block your middle meatus.   People with chronic nasal problems or allergies are more likely to get acute sinusitis. Sinusitis is also more common if you have a weakened immune system, such as with HIV. You're also more likely to get sinusitis if you have cystic fibrosis or another condition that causes your body to make extra mucus.

## 2023-07-24 ENCOUNTER — Encounter: Admit: 2023-07-24 | Discharge: 2023-07-24 | Payer: MEDICARE

## 2023-07-24 NOTE — Telephone Encounter
 Pre-visit planning encounter.     Here for sleep eval    Last SS: 01/04/23 split night scanned under Outside records [facility]  7.2     Mychart message sent to patient requesting she bring CPAP for download.

## 2023-07-25 ENCOUNTER — Encounter: Admit: 2023-07-25 | Discharge: 2023-07-25 | Payer: MEDICARE

## 2023-07-25 ENCOUNTER — Ambulatory Visit: Admit: 2023-07-25 | Discharge: 2023-07-25 | Payer: MEDICARE

## 2023-07-25 DIAGNOSIS — G2581 Restless legs syndrome: Secondary | ICD-10-CM

## 2023-07-25 DIAGNOSIS — J701 Chronic and other pulmonary manifestations due to radiation: Secondary | ICD-10-CM

## 2023-07-25 DIAGNOSIS — G4700 Insomnia, unspecified: Secondary | ICD-10-CM

## 2023-07-25 MED ORDER — TRAZODONE 50 MG PO TAB
50 mg | ORAL_TABLET | Freq: Every evening | ORAL | 0 refills | Status: AC | PRN
Start: 2023-07-25 — End: ?

## 2023-07-25 NOTE — Assessment & Plan Note
 General behavioral modifications:  Go to bed when sleepy. Keep a fixed wake time.   Dim lights 1-2 hours prior to bedtime.  Bright light in the AM, either using morning light or a light box in the winters. Eg. Happy Light on [employer].   Bed is for sleep and sex only.   Avoid daytime naps.   Avoid caffeine, alcohol or cigarettes close to bedtime.   If awakens in the middle of the night and unable to fall asleep within 20 minutes, go to a cool dim corner of house and read a boring book. Go back to bed when sleepy.     We discussed the 3P model of insomnia.   Discussed sleep society guidelines recommend CBT-I as first line.   This is a good option for highly motivated patients.   It consists of 6 sessions chiefly comprising of behavioral strategies to re-orient the relationship of patient with bed and sleep.   Telehealth referral/options provided on AVS.   Once optimized, patient can consider self weaning sleep aid.     We will start her on a small dose of Trazodone, starting 50 mg, as needed. Did discuss common side effects, including next sedation and increased risk of falls.

## 2023-07-25 NOTE — Assessment & Plan Note
 Discussed the role of iron as primary metabolite controlling leg movements in brain.   Will start off with ordering iron studies.   Can replete with oral iron if ferritin <75 ng/mL.

## 2023-07-25 NOTE — Progress Notes
 Date of Service: 07/25/2023    Subjective:             Cindy Randolph is a 63 y.o. female.    Has history of left breast cancer s/p mastectomy in 2022, cervical spine radiculopathy, lumbar degenerative disease, osteoporosis, migraine, depression, OSA on CPAP and GERD with hiatal hernia.     History of Present Illness    Chief Complaint/Reason for Referral:    Referring Provider: Lewayne Randolph, Cindy Echevaria, MD  [address]  [place],  [place] [address]     She reports being a poor sleeper, especially since her pregnancy.   Bedtime: 9 pm  Sleep onset latency: 10-15 minutes.   Awakenings: 6 times or so, minutes to hours.   Around 2 times a week or so, she will be up around 2 hours or so, unable to go back to sleep.   Wake time: 6:30 am  Naps: Not usually  Witnessed snoring? Witnessed apneas? Wake up gasping/choking? None on CPAP  She does report dry mouth and morning headaches.   When she slept on her back, she would feel her soft palate close.     She does report an itchy, creepy crawly sensation of her legs that keeps her up at night.   Prior to January, it was happening 4-5 times/night.   A few nights, it has been happening every night.   Usually lasts 10-15 minutes or so.   She has tried Gabapentin and Lyrica in the past (this was more so for fibromyalgia), but this gave her nightmares.     She additionally reports concerns about fibrosis from radiation therapy and lower lobe atelectasis.   Her breast was diagnosed in February, 2022. Mastectomy in September, 2022. Chemotherapy in Fall, 2022. She reports receiving high dose radiation therapy in February, 2023.     Last sleep study was a split night study on 01/04/2023.   This was at [facility].   AHI 3% 12.9  AHI 4% 7.2  Was titrated to CPAP of 6 cm H2O.     *See clarification to intake above.        07/18/2023     8:58 AM   Sleep New Patient Intake   Reason for visit:  Other   If Other selected, please specify: Sleep, but also I have fibrosis from radiation therapy, lower lobe atelectasis from ?Marland Kitchen Non smoker but theough early life parents were heavy smokers.   Do you have a sleep complaint? Yes   If yes, describe your sleep complaint. Sleep apnea, but poor sleep for 30 year, since first child.   Have you ever had a sleep study in the past? Yes   If yes, what month and year was the last sleep study you had? Sept 2024   Are you currently using any sort of treatment for your sleep condition? Yes   If yes, select all that apply:  Positive airway pressure device (CPAP, BiPAP)   Do you work a night, evening, split, rotating or variable schedule shift? No   During the past month, at what time do you usually go to bed? 9 pm   Once in bed, how long does it take you to fall asleep? 10-15 min   Once asleep, how many times do you wake up during the night? 6   How long does it take for you to fall back to sleep? Minites to hours   During the past month, at what time do you  usually wake up? 6:30am   Considering only the time that you spend asleep, how many hours of sleep do you get? 6   Please select any sleep aids used at bedtime: Noise machines   If sleep aid(s) selected, describe:  Fan   Do you consume alcohol at bedtime? No   Do you consume caffeinated beverages up to 6 hours prior to bedtime? No   Do you use marijuana at bedtime? No   Do you feel a strong, often uncontrollable urge to move your legs, accompanied with unpleasant or uncomfortable sensation in your legs or feet? Yes (answer the following questions)   Does moving (e.g. walking) or rubbing your legs bring relief to the discomfort? Yes   Are these symptoms worse when you are resting? Yes   Are these symptoms worse later in the day or at night? Night   Sudden muscle weakness in response to emotions such as laughter, anger or surprise Never   Inability to move while falling asleep or when waking up Never   Hallucinations or dreamlike images when falling asleep or waking up Rarely   Dream during daytime naps Frequently   Act out your dreams while asleep Rarely   Nightmares Sometimes   Talk in your sleep Never   Sleep walking as an adult Never   Heartburn or acid reflux at night Frequently   Chronic pain interferes with your sleep Frequently   Wake up to urinate at night Sometimes   Grind your teeth in your sleep Sometimes   Bedwetting (enuresis) Never   Leg kicks Rarely   Morning headaches Rarely   In the past month, my overall sleep quality was:  Poor   In the last month:My sleep was restless. Somewhat   In the last month:I felt lousy when I woke up.. A little bit   In the last month:I had difficulty falling asleep. Not at all   In the last month:I woke up too early and could not fall back asleep. Quite a bit   In the last month:I had trouble staying asleep. Quite a bit   In the last month:I had difficulty waking up. Not at all   In the last month:Cindy Randolph AMB R SLEEP NEW PT FELT TIRED Very much   In the last month:I was sleepy during the daytime. Quite a bit   In the last month:I had a hard time concentrating because of poor sleep. Quite a bit   In the last month:I felt irritable because of poor sleep. Quite a bit   During a usual week, how many times do you nap for 5 minutes or more? 5   What is the chance that you would doze off or fall asleep (not just ?feel tired?) while driving? No chance             07/18/2023     8:59 AM 02/16/2023     6:43 AM   Epworth Sleepiness Scale   Sitting and reading 0 1   Watching TV 2 1   Sitting inactive in a public place (e.g. a theater or a meeting) 1 1   As a passenger in a car for an hour without a break 1 2   Lying down to rest in the afternoon when circumstances permit 3 3   Sitting and talking to someone 0 0   Sitting quietly after a lunch without alcohol 1 0   In a car, while stopped for a few minutes in traffic 0 0  Epworth Sleepiness Scale Score 8  8       Patient-reported         STOP-BANG Score: (Patient-Rptd) 4  If score > 3 there is a high probability that they have OSA.     Insomnia Severity Index Total Score: (Patient-Rptd) 15       Past Medical History:    Accidental fall    Acquired hypothyroidism    Actinic keratosis    Adverse drug reaction    Allergy    Anxiety    Aortic aneurysm ()    Ashkenazi Jewish ancestry requiring population-specific genetic screening    Back pain    Breast cancer in female [facility])    Cancer of thyroid (HCC)    Chest pain    Coronary artery disease    Coronary atherosclerosis    Degenerative disc disease, cervical    Degenerative disc disease, lumbar    Degenerative disc disease, thoracic    Depressive disorder, not elsewhere classified    Dizziness    Esophageal stricture    Essential hypertension    Family history of coronary artery disease in brother    Fibromyalgia    Genetic testing    GERD (gastroesophageal reflux disease)    H/O total thyroidectomy    Hashimoto's thyroiditis    Heart murmur    Hiatal hernia    High cholesterol    History of bilateral mastectomy    History of external beam radiation therapy    History of thyroid cancer    Hormone replacement therapy    Hx antineoplastic chemo    Hyperparathyroidism ()    Hyperthyroidism    Hyponatremia    Hypothyroid    Incontinence    Infection    Inflammatory arthritis    Joint pain    Limb alert care status    Low vitamin D level    Migraine    Mild mitral and aortic regurgitation    Obstructive sleep apnea    Osteoporosis    Other and unspecified hyperlipidemia    Other malignant neoplasm without specification of site    Peripheral neuropathy    Personal history of irradiation    Postmenopausal    PUD (peptic ulcer disease)    Pulmonary fibrosis (HCC)    Seasonal allergic reaction    Sleep apnea    Spinal headache    Spinal stenosis    Thyroid nodule    Torn meniscus    Ulcer    Unspecified deficiency anemia      Sleep Family History: Not diagnosed.   Social History: She denies any smoking, drinking or recreational drug use. Previously worked as a Engineer, civil (consulting), retired in 2020. Objective:         acetaminophen (TYLENOL) 160 mg/5 mL oral suspension Take 10.2 mL by mouth every 6 hours as needed. Indications: pain    acetaminophen (TYLENOL) 325 mg tablet Take two tablets by mouth every 6 hours. Take scheduled for 3 days after surgery, then as needed. Do not exceed 4,000mg  in a 24 hour period.    amLODIPine (NORVASC) 5 mg tablet Take one tablet by mouth daily.    amoxicillin/K clavulanate (AUGMENTIN) 400 mg/5 mL oral suspension Take 10 mL by mouth twice daily for 7 days. Take with food.    aspirin 81 mg chewable tablet Chew one tablet by mouth daily.    biotin 1 mg cap Take one capsule by mouth daily.    calcipotriene (DOVONEX) 0.005 % topical cream Apply twice  daily to forehead and temple for 4 days.    calcium carbonate (OS-CAL) 1250 mg (500 mg elemental calcium) tablet Take one tablet by mouth daily.    CHOLEcalciferoL (vitamin D3) 5000 unit tablet Take one tablet by mouth daily.    clobetasoL (TEMOVATE) 0.05 % topical ointment Apply  topically to affected area twice daily. Apply to areas of rash on hands twice daily; do NOT use on face/groin/underarms, use up to 2 weeks per month    diazePAM (VALIUM) 2 mg tablet Please bring to mohs appointment, must have a driver, also please do not take until consent is signed    diclofenac sodium (VOLTAREN) 1 % topical gel Apply four g topically to affected area daily as needed.    ezetimibe (ZETIA) 10 mg tablet Take one tablet by mouth daily.    Fish,Bora,Flax Oils-OM3,6,9 #1 (TRIPLE OMEGA) 400-400-400 mg capsule Take one capsule by mouth at bedtime daily.    lidocaine hcl viscous (LIDOCAINE VISCOUS) 2 % solution Swish and Swallow 5 mL by mouth as directed four times daily as needed. Indications: suppression of the gag reflex    metoprolol succinate XL (TOPROL XL) 25 mg extended release tablet Take one tablet by mouth at bedtime daily.    Miscellaneous Medical Supply misc DME requirements, rx to include the following typed information:    NPI: [id number]  Doctor's typed name: Assunta Curtis, MD  ICD 10: G47.33  Length of need: 99 months  Start date:02/14/2023  Signature date: 02/14/2023  Detailed equipment: CPAP machine with any accessories.    naltrexone 1 mg oral capsule (BATCHED COMPOUND) Take 1 capsule by mouth daily for 1 week, THEN take 2 capsules by mouth daily x 1 week, THEN take 3 capsules daily x 1 week, THEN take 4 capsules daily thereafter.    omeprazole DR (PRILOSEC) 40 mg capsule Take one capsule by mouth daily. Open the capsule in apple juice for 2 months, then stop.  Indications: gastroesophageal reflux disease    ondansetron (ZOFRAN ODT) 4 mg rapid dissolve tablet Dissolve one tablet by mouth every 6 hours as needed for Nausea. Place on tongue to dissolve.  Indications: prevent nausea and vomiting after surgery    other medication Take one Dose by mouth daily. K Complete K1 & K2 as MK-4 & MK-7 : Take 1 softgel by mouth once daily    peg 3350-electrolytes (GOLYTELY) 236-22.74-6.74 -5.86 gram oral solution Mix as directed on package.Drink Slowly over 24 hours, you can eat and drink normally.    polyethylene glycol 3350 (MIRALAX) 17 g packet Take one packet by mouth daily. Increase as needed in order to have 1-2 soft bowel movements per day without straining.  Indications: constipation    promethazine (PROMETHEGAN) 12.5 mg rectal suppository Insert or Apply one suppository to rectal area as directed every 6 hours as needed for Nausea. Indications: prevent nausea and vomiting after surgery    rosuvastatin (CRESTOR) 10 mg tablet Take one tablet by mouth daily.    Selenium 100 mcg tab Take one tablet by mouth daily.    simethicone (MYLICON) 40 mg/0.6 mL drops Take 0.6 mL by mouth every 6 hours as needed. Indications: For gas pain    sodium,potassium,mag sulfates (SUPREP BOWEL PREP KIT) 17.5-3.13-1.6 gram oral solution Evening prior: mix 1 bottle with 16 ounces of water and drink orally, followed by 32 additional ounces of water.  Day of: mix 2nd bottle and drink orally, followed by 32 ounces of water. Complete the bowel preparation at  least 2 hrs before procedure.    sucralfate (CARAFATE) 100 mg/mL oral suspension Take 10 mL by mouth every 6 hours. Take for 4 weeks following surgery.  Indications: stress ulcer prevention    SUMAtriptan succinate (IMITREX) 25 mg tablet Take one tablet by mouth at onset of headache. May repeat after 2 hours if needed. Max of 200 mg in 24 hours.    SYNTHROID 100 mcg tablet Take one tablet by mouth daily 30 minutes before breakfast.    tiZANidine (ZANAFLEX) 2 mg tablet Take one tablet by mouth twice daily as needed.    traMADoL (ULTRAM) 50 mg tablet Take one-half tablet to one tablet by mouth every 8 hours as needed for Pain. Indications: pain    traZODone (DESYREL) 50 mg tablet Take one tablet by mouth at bedtime as needed for Sleep.    vit A/vit C/vit E/zinc/copper (PRESERVISION AREDS PO) Take 1 tablet by mouth daily.    vitamins, B complex tab Take one tablet by mouth daily.    vitamins, multiple cap Take one capsule by mouth daily.     Vitals:    07/25/23 0946   BP: 125/73   Pulse: 94   Temp: 36.5 ?C (97.7 ?F)   SpO2: 96%   TempSrc: Skin   Weight: 69.9 kg (154 lb)   Height: 158.8 cm (5' 2.5)  Comment: per pt     Body mass index is 27.72 kg/m?Marland Kitchen     Physical Exam  Vitals and nursing note reviewed.   Constitutional:       Appearance: Normal appearance.   HENT:      Mouth/Throat:      Mouth: Mucous membranes are moist.      Comments: Mallampati 3  High arched palate  Cardiovascular:      Rate and Rhythm: Normal rate and regular rhythm.      Heart sounds: No murmur heard.  Pulmonary:      Effort: Pulmonary effort is normal. No respiratory distress.      Breath sounds: Normal breath sounds. No wheezing.   Musculoskeletal:      Cervical back: Normal range of motion and neck supple.   Neurological:      Mental Status: She is alert and oriented to person, place, and time.   Psychiatric: Mood and Affect: Mood normal.              Assessment and Plan:    Problem   Restless Leg Syndrome    She does report an itchy, creepy crawly sensation of her legs that keeps her up at night.   Prior to January, it was happening 4-5 times/night.   A few nights, it has been happening every night.   Usually lasts 10-15 minutes or so.   She has tried Gabapentin and Lyrica in the past (this was more so for fibromyalgia), but this gave her nightmares.      Iron Metabolism Disorder   Osa (Obstructive Sleep Apnea)    Last sleep study was a split night study on 01/04/2023.   This was at [facility].   AHI 3% 12.9  AHI 4% 7.2  Was titrated to CPAP of 6 cm H2O.      Insomnia   Radiation-Induced Pulmonary Fibrosis ()        OSA (obstructive sleep apnea)  Patient is well controlled on these settings and experiences symptomatic benefit.   Compliance could be better but hampered by sinus issues.   We advised patient to use Flonase/Zyrtec  to see if she gets any benefit.   Will go up on humidifier settings and tube temp.   She was advised to use CPAP during any sleep periods.   Provided education/educational materials on CPAP hygiene and need for follow-up visits.       Insomnia  General behavioral modifications:  Go to bed when sleepy. Keep a fixed wake time.   Dim lights 1-2 hours prior to bedtime.  Bright light in the AM, either using morning light or a light box in the winters. Eg. Happy Light on [employer].   Bed is for sleep and sex only.   Avoid daytime naps.   Avoid caffeine, alcohol or cigarettes close to bedtime.   If awakens in the middle of the night and unable to fall asleep within 20 minutes, go to a cool dim corner of house and read a boring book. Go back to bed when sleepy.     We discussed the 3P model of insomnia.   Discussed sleep society guidelines recommend CBT-I as first line.   This is a good option for highly motivated patients.   It consists of 6 sessions chiefly comprising of behavioral strategies to re-orient the relationship of patient with bed and sleep.   Telehealth referral/options provided on AVS.   Once optimized, patient can consider self weaning sleep aid.     We will start her on a small dose of Trazodone, starting 50 mg, as needed. Did discuss common side effects, including next sedation and increased risk of falls.      Radiation-induced pulmonary fibrosis ()  Chronic stable condition  Will continue to monitor    Restless leg syndrome  Discussed the role of iron as primary metabolite controlling leg movements in brain.   Will start off with ordering iron studies.   Can replete with oral iron if ferritin <75 ng/mL.            RTC 3 months    Seen and discussed with Dr. Nechama Guard, MBBS  Sleep Medicine Fellow, [facility]  NPI: [id number]

## 2023-07-25 NOTE — Assessment & Plan Note
Chronic stable condition  Will continue to monitor

## 2023-07-26 ENCOUNTER — Encounter: Admit: 2023-07-26 | Discharge: 2023-07-26 | Payer: MEDICARE

## 2023-07-26 ENCOUNTER — Ambulatory Visit: Admit: 2023-07-26 | Discharge: 2023-07-27 | Payer: MEDICARE

## 2023-07-26 DIAGNOSIS — G4733 Obstructive sleep apnea (adult) (pediatric): Secondary | ICD-10-CM

## 2023-07-26 MED ORDER — EZETIMIBE 10 MG PO TAB
10 mg | ORAL_TABLET | Freq: Every day | ORAL | 3 refills | Status: AC
Start: 2023-07-26 — End: ?

## 2023-07-27 ENCOUNTER — Encounter: Admit: 2023-07-27 | Discharge: 2023-07-27 | Payer: MEDICARE

## 2023-07-29 ENCOUNTER — Encounter: Admit: 2023-07-29 | Discharge: 2023-07-29 | Payer: MEDICARE

## 2023-07-30 ENCOUNTER — Encounter: Admit: 2023-07-30 | Discharge: 2023-07-30 | Payer: MEDICARE

## 2023-08-02 ENCOUNTER — Encounter: Admit: 2023-08-02 | Discharge: 2023-08-02 | Payer: MEDICARE

## 2023-08-04 ENCOUNTER — Encounter: Admit: 2023-08-04 | Discharge: 2023-08-04 | Payer: MEDICARE

## 2023-08-13 ENCOUNTER — Encounter: Admit: 2023-08-13 | Discharge: 2023-08-13 | Payer: MEDICARE

## 2023-08-14 ENCOUNTER — Encounter: Admit: 2023-08-14 | Discharge: 2023-08-14 | Payer: MEDICARE

## 2023-08-15 ENCOUNTER — Encounter: Admit: 2023-08-15 | Discharge: 2023-08-15 | Payer: MEDICARE

## 2023-08-16 ENCOUNTER — Encounter: Admit: 2023-08-16 | Discharge: 2023-08-16 | Payer: MEDICARE

## 2023-08-18 ENCOUNTER — Encounter: Admit: 2023-08-18 | Discharge: 2023-08-18 | Payer: MEDICARE

## 2023-08-18 ENCOUNTER — Ambulatory Visit: Admit: 2023-08-18 | Discharge: 2023-08-19 | Payer: MEDICARE

## 2023-08-18 DIAGNOSIS — M5412 Radiculopathy, cervical region: Secondary | ICD-10-CM

## 2023-08-18 DIAGNOSIS — Z92241 Personal history of systemic steroid therapy: Secondary | ICD-10-CM

## 2023-08-18 NOTE — Progress Notes
 [facility]INIC [facility]       SUBJECTIVE: 63 year old female presenting in follow-up after last visit on 07/14/2023.  At that time we did transforaminal epidural steroid injection on her right side, few days before that we did transforaminal epidural steroid injections on her left side of her lumbar spine, this was all after having a mild procedure done 04/08/2023 by Dr. Fredna Dow at L3-4.  She reports significant relief after the epidural steroid injections.  She is being able to be much more active and is feeling better.  Unfortunately she thinks she might of overdone it and it kind of flared some of the pain up.  Most of the pain is just going down the right leg now.  She is also experiencing neck pain.  This pain starts in her neck and radiates down bilateral arms.  We done a cervical epidural steroid injection in the past with almost 100% relief.  She is also experiencing rib pain at the very bottom of her rib cage.  She feels like the very bottom rib is painful to touch going all the way around to her abdomen and stomach.  She has been prescribed physical therapy for that and is currently working with them.         Review of Systems    Current Outpatient Medications:     acetaminophen (TYLENOL) 160 mg/5 mL oral suspension, Take 10.2 mL by mouth every 6 hours as needed. Indications: pain, Disp: 150 mL, Rfl: 0    acetaminophen (TYLENOL) 325 mg tablet, Take two tablets by mouth every 6 hours. Take scheduled for 3 days after surgery, then as needed. Do not exceed 4,000mg  in a 24 hour period., Disp: 40 tablet, Rfl: 0    amLODIPine (NORVASC) 5 mg tablet, Take one tablet by mouth daily., Disp: 90 tablet, Rfl: 3    aspirin 81 mg chewable tablet, Chew one tablet by mouth daily., Disp: 90 tablet, Rfl: 0    biotin 1 mg cap, Take one capsule by mouth daily., Disp: , Rfl:     calcipotriene (DOVONEX) 0.005 % topical cream, Apply twice daily to forehead and temple for 4 days., Disp: 60 g, Rfl: 1    calcium carbonate (OS-CAL) 1250 mg (500 mg elemental calcium) tablet, Take one tablet by mouth daily., Disp: , Rfl:     CHOLEcalciferoL (vitamin D3) 5000 unit tablet, Take one tablet by mouth daily., Disp: , Rfl:     clobetasoL (TEMOVATE) 0.05 % topical ointment, Apply  topically to affected area twice daily. Apply to areas of rash on hands twice daily; do NOT use on face/groin/underarms, use up to 2 weeks per month, Disp: 60 g, Rfl: 3    diazePAM (VALIUM) 2 mg tablet, Please bring to mohs appointment, must have a driver, also please do not take until consent is signed, Disp: 1 tablet, Rfl: 0    diclofenac sodium (VOLTAREN) 1 % topical gel, Apply four g topically to affected area daily as needed., Disp: , Rfl:     ezetimibe (ZETIA) 10 mg tablet, Take one tablet by mouth daily., Disp: 90 tablet, Rfl: 3    Fish,Bora,Flax Oils-OM3,6,9 #1 (TRIPLE OMEGA) 400-400-400 mg capsule, Take one capsule by mouth at bedtime daily., Disp: , Rfl:     lidocaine hcl viscous (LIDOCAINE VISCOUS) 2 % solution, Swish and Swallow 5 mL by mouth as directed four times daily as needed. Indications: suppression of the gag reflex, Disp: 100 mL, Rfl: 0    metoprolol succinate XL (TOPROL XL)  25 mg extended release tablet, Take one tablet by mouth at bedtime daily., Disp: 90 tablet, Rfl: 3    Miscellaneous Medical Supply misc, DME requirements, rx to include the following typed information:  NPI: [id number] Doctor's typed name: Assunta Curtis, MD ICD 10: G47.33 Length of need: 99 months Start date:02/14/2023 Signature date: 02/14/2023 Detailed equipment: CPAP machine with any accessories., Disp: 1 each, Rfl: 0    naltrexone 1 mg oral capsule (BATCHED COMPOUND), Take 1 capsule by mouth daily for 1 week, THEN take 2 capsules by mouth daily x 1 week, THEN take 3 capsules daily x 1 week, THEN take 4 capsules daily thereafter., Disp: 90 capsule, Rfl: 0    omeprazole DR (PRILOSEC) 40 mg capsule, Take one capsule by mouth daily. Open the capsule in apple juice for 2 months, then stop.  Indications: gastroesophageal reflux disease, Disp: , Rfl:     ondansetron (ZOFRAN ODT) 4 mg rapid dissolve tablet, Dissolve one tablet by mouth every 6 hours as needed for Nausea. Place on tongue to dissolve.  Indications: prevent nausea and vomiting after surgery, Disp: 25 tablet, Rfl: 0    other medication, Take one Dose by mouth daily. K Complete K1 & K2 as MK-4 & MK-7 : Take 1 softgel by mouth once daily, Disp: , Rfl:     peg 3350-electrolytes (GOLYTELY) 236-22.74-6.74 -5.86 gram oral solution, Mix as directed on package.Drink Slowly over 24 hours, you can eat and drink normally., Disp: 4000 mL, Rfl: 0    polyethylene glycol 3350 (MIRALAX) 17 g packet, Take one packet by mouth daily. Increase as needed in order to have 1-2 soft bowel movements per day without straining.  Indications: constipation, Disp: 24 packet, Rfl: 0    promethazine (PROMETHEGAN) 12.5 mg rectal suppository, Insert or Apply one suppository to rectal area as directed every 6 hours as needed for Nausea. Indications: prevent nausea and vomiting after surgery, Disp: 12 each, Rfl: 0    rosuvastatin (CRESTOR) 10 mg tablet, Take one tablet by mouth daily., Disp: 90 tablet, Rfl: 3    Selenium 100 mcg tab, Take one tablet by mouth daily., Disp: , Rfl:     simethicone (MYLICON) 40 mg/0.6 mL drops, Take 0.6 mL by mouth every 6 hours as needed. Indications: For gas pain, Disp: 30 mL, Rfl: 0    sodium,potassium,mag sulfates (SUPREP BOWEL PREP KIT) 17.5-3.13-1.6 gram oral solution, Evening prior: mix 1 bottle with 16 ounces of water and drink orally, followed by 32 additional ounces of water.  Day of: mix 2nd bottle and drink orally, followed by 32 ounces of water. Complete the bowel preparation at least 2 hrs before procedure., Disp: 354 mL, Rfl: 0    sucralfate (CARAFATE) 100 mg/mL oral suspension, Take 10 mL by mouth every 6 hours. Take for 4 weeks following surgery.  Indications: stress ulcer prevention, Disp: 473 mL, Rfl: 1 SUMAtriptan succinate (IMITREX) 25 mg tablet, Take one tablet by mouth at onset of headache. May repeat after 2 hours if needed. Max of 200 mg in 24 hours., Disp: 9 tablet, Rfl: 0    SYNTHROID 100 mcg tablet, Take one tablet by mouth daily 30 minutes before breakfast., Disp: 90 tablet, Rfl: 3    tiZANidine (ZANAFLEX) 2 mg tablet, Take one tablet by mouth twice daily as needed., Disp: , Rfl:     traMADoL (ULTRAM) 50 mg tablet, Take one-half tablet to one tablet by mouth every 8 hours as needed for Pain. Indications: pain, Disp: 15 tablet, Rfl:  0    traZODone (DESYREL) 50 mg tablet, Take one tablet by mouth at bedtime as needed for Sleep., Disp: 30 tablet, Rfl: 0    vit A/vit C/vit E/zinc/copper (PRESERVISION AREDS PO), Take 1 tablet by mouth daily., Disp: , Rfl:     vitamins, B complex tab, Take one tablet by mouth daily., Disp: , Rfl:     vitamins, multiple cap, Take one capsule by mouth daily., Disp: , Rfl:   Allergies   Allergen Reactions    Mango ANAPHYLAXIS    Other [Unclassified Drug] ANAPHYLAXIS     DUCK Meat and Eggs     Imdur [Isosorbide Mononitrate] CHEST TIGHTNESS and RASH    Iodinated Contrast Media RASH and ITCHING     04/2023 pt developed throat itching after IV Contrast in CT, itching subsided after 5 minutes without intervention.   She developed rash on back and neck after hospitalization 06/2022 where she underwent heart cath. Unsure if contrast was   cause of rash, but it developed shortly after procedure.    Oxycodone NAUSEA ONLY     Prefers tramadol    Sudafed [Pseudoephedrine Hcl] PALPITATIONS     Physical Exam  There were no vitals filed for this visit.     Oswestry Total Score:: (Patient-Rptd) 50     There is no height or weight on file to calculate BMI.  Physical Exam    Gen: comfortable, NAD    HEENT: NCAT, anicteric sclera    Card: Extremities warm, well-perfused, cap refill <2sec    Pulm: no distress, not cyanotic    Abd: soft, non-distended    Skin: Skin is warm and dry.  Psychiatric: normal mood and affect. Behavior is normal.     Neuro    CNII-XII grossly normal    Mental status appropriate     MSK:    Inspection: grossly symmetric, no obvious deformity, no erythema           IMPRESSION:  1. Lumbar radiculopathy    2. S/P epidural steroid injection    3. Cervical radiculopathy          PLAN: Plan is to schedule repeat epidural steroid injections, we will do a cervical epidural steroid injection at C7-T1 and a lumbar epidural steroid injection at L4-5 within the same week to counted as 1 steroid exposure.  She was encouraged to continue conservative management for her mid back and rib pain.  If she is not getting significant relief from that we can consider either thoracic epidural or nerve block of the intercostal nerves.

## 2023-08-19 ENCOUNTER — Encounter: Admit: 2023-08-19 | Discharge: 2023-08-19

## 2023-08-19 ENCOUNTER — Ambulatory Visit: Admit: 2023-08-19 | Discharge: 2023-08-20

## 2023-08-19 ENCOUNTER — Ambulatory Visit: Admit: 2023-08-19 | Discharge: 2023-08-19

## 2023-08-19 DIAGNOSIS — N3001 Acute cystitis with hematuria: Secondary | ICD-10-CM

## 2023-08-19 DIAGNOSIS — M5416 Radiculopathy, lumbar region: Secondary | ICD-10-CM

## 2023-08-19 DIAGNOSIS — R399 Unspecified symptoms and signs involving the genitourinary system: Secondary | ICD-10-CM

## 2023-08-19 MED ORDER — PHENAZOPYRIDINE 200 MG PO TAB
200 mg | ORAL_TABLET | Freq: Three times a day (TID) | ORAL | 0 refills | Status: AC | PRN
Start: 2023-08-19 — End: ?

## 2023-08-19 MED ORDER — NITROFURANTOIN MONOHYD/M-CRYST 100 MG PO CAP
100 mg | ORAL_CAPSULE | Freq: Two times a day (BID) | ORAL | 0 refills | 7.00000 days | Status: AC
Start: 2023-08-19 — End: ?

## 2023-08-19 NOTE — Progress Notes
 Telehealth Visit Note    Date of Service: 08/19/2023    Subjective:           Cindy Randolph is a 63 y.o. female.    History of Present Illness:  Cindy Randolph is here today for evaluation and treatment of urinary urgency, frequency, bladder pain, and blood in her urine that started today.  She is currently doing pelvic floor therapy and knows how to deal with spasms and everything.  She states that when she went to the bathroom this morning she was having a Randolph of bladder spasms.  She states that when she wiped she had some blood on the toliet paper and some in her urine as well.  She doesn't have a fever at this time and denies any flank pain or CVA tenderness.  She states that she is having urinary urgency and frequency and can't sit long because of she feels like her pelvic floor symptoms are acting up because of this.  She has had UTIs in the past and states that this feels similar.  The last UTI she had was a year ago or so when they were trying to figure out her pelvic floor issues.                Objective:          acetaminophen (TYLENOL) 160 mg/5 mL oral suspension Take 10.2 mL by mouth every 6 hours as needed. Indications: pain    acetaminophen (TYLENOL) 325 mg tablet Take two tablets by mouth every 6 hours. Take scheduled for 3 days after surgery, then as needed. Do not exceed 4,000mg  in a 24 hour period.    amLODIPine (NORVASC) 5 mg tablet Take one tablet by mouth daily.    aspirin 81 mg chewable tablet Chew one tablet by mouth daily.    biotin 1 mg cap Take one capsule by mouth daily.    calcipotriene (DOVONEX) 0.005 % topical cream Apply twice daily to forehead and temple for 4 days.    calcium carbonate (OS-CAL) 1250 mg (500 mg elemental calcium) tablet Take one tablet by mouth daily.    CHOLEcalciferoL (vitamin D3) 5000 unit tablet Take one tablet by mouth daily.    clobetasoL (TEMOVATE) 0.05 % topical ointment Apply  topically to affected area twice daily. Apply to areas of rash on hands twice daily; do NOT use on face/groin/underarms, use up to 2 weeks per month    diazePAM (VALIUM) 2 mg tablet Please bring to mohs appointment, must have a driver, also please do not take until consent is signed    diclofenac sodium (VOLTAREN) 1 % topical gel Apply four g topically to affected area daily as needed.    ezetimibe (ZETIA) 10 mg tablet Take one tablet by mouth daily.    Fish,Bora,Flax Oils-OM3,6,9 #1 (TRIPLE OMEGA) 400-400-400 mg capsule Take one capsule by mouth at bedtime daily.    lidocaine hcl viscous (LIDOCAINE VISCOUS) 2 % solution Swish and Swallow 5 mL by mouth as directed four times daily as needed. Indications: suppression of the gag reflex    metoprolol succinate XL (TOPROL XL) 25 mg extended release tablet Take one tablet by mouth at bedtime daily.    Miscellaneous Medical Supply misc DME requirements, rx to include the following typed information:    NPI: [id number]  Doctor's typed name: Assunta Curtis, MD  ICD 10: G47.33  Length of need: 99 months  Start date:02/14/2023  Signature date: 02/14/2023  Detailed equipment: CPAP machine  with any accessories.    naltrexone 1 mg oral capsule (BATCHED COMPOUND) Take 1 capsule by mouth daily for 1 week, THEN take 2 capsules by mouth daily x 1 week, THEN take 3 capsules daily x 1 week, THEN take 4 capsules daily thereafter.    omeprazole DR (PRILOSEC) 40 mg capsule Take one capsule by mouth daily. Open the capsule in apple juice for 2 months, then stop.  Indications: gastroesophageal reflux disease    ondansetron (ZOFRAN ODT) 4 mg rapid dissolve tablet Dissolve one tablet by mouth every 6 hours as needed for Nausea. Place on tongue to dissolve.  Indications: prevent nausea and vomiting after surgery    other medication Take one Dose by mouth daily. K Complete K1 & K2 as MK-4 & MK-7 : Take 1 softgel by mouth once daily    peg 3350-electrolytes (GOLYTELY) 236-22.74-6.74 -5.86 gram oral solution Mix as directed on package.Drink Slowly over 24 hours, you can eat and drink normally.    polyethylene glycol 3350 (MIRALAX) 17 g packet Take one packet by mouth daily. Increase as needed in order to have 1-2 soft bowel movements per day without straining.  Indications: constipation    promethazine (PROMETHEGAN) 12.5 mg rectal suppository Insert or Apply one suppository to rectal area as directed every 6 hours as needed for Nausea. Indications: prevent nausea and vomiting after surgery    rosuvastatin (CRESTOR) 10 mg tablet Take one tablet by mouth daily.    Selenium 100 mcg tab Take one tablet by mouth daily.    simethicone (MYLICON) 40 mg/0.6 mL drops Take 0.6 mL by mouth every 6 hours as needed. Indications: For gas pain    sodium,potassium,mag sulfates (SUPREP BOWEL PREP KIT) 17.5-3.13-1.6 gram oral solution Evening prior: mix 1 bottle with 16 ounces of water and drink orally, followed by 32 additional ounces of water.  Day of: mix 2nd bottle and drink orally, followed by 32 ounces of water. Complete the bowel preparation at least 2 hrs before procedure.    sucralfate (CARAFATE) 100 mg/mL oral suspension Take 10 mL by mouth every 6 hours. Take for 4 weeks following surgery.  Indications: stress ulcer prevention    SUMAtriptan succinate (IMITREX) 25 mg tablet Take one tablet by mouth at onset of headache. May repeat after 2 hours if needed. Max of 200 mg in 24 hours.    SYNTHROID 100 mcg tablet Take one tablet by mouth daily 30 minutes before breakfast.    tiZANidine (ZANAFLEX) 2 mg tablet Take one tablet by mouth twice daily as needed.    traMADoL (ULTRAM) 50 mg tablet Take one-half tablet to one tablet by mouth every 8 hours as needed for Pain. Indications: pain    traZODone (DESYREL) 50 mg tablet Take one tablet by mouth at bedtime as needed for Sleep.    vit A/vit C/vit E/zinc/copper (PRESERVISION AREDS PO) Take 1 tablet by mouth daily.    vitamins, B complex tab Take one tablet by mouth daily.    vitamins, multiple cap Take one capsule by mouth daily. Telehealth Body Mass Index: [phone] at 08/19/2023  9:39 AM    Physical Exam  Constitutional:       Appearance: Normal appearance.   HENT:      Head: Normocephalic.      Mouth/Throat:      Lips: Pink.   Eyes:      General: Lids are normal.   Pulmonary:      Effort: Pulmonary effort is normal.   Neurological:  General: No focal deficit present.      Mental Status: She is alert and oriented to person, place, and time.   Psychiatric:         Attention and Perception: Attention and perception normal.         Mood and Affect: Mood and affect normal.         Speech: Speech normal.         Behavior: Behavior normal. Behavior is cooperative.         Thought Content: Thought content normal.         Cognition and Memory: Cognition and memory normal.         Judgment: Judgment normal.              Assessment and Plan:  1. Acute cystitis with hematuria        2. Urinary symptom or sign  URINALYSIS DIPSTICK    CULTURE-URINE W/SENSITIVITY        Encounter Medications   Medications    nitrofurantoin monohyd/m-cryst (MACROBID) 100 mg capsule     Sig: Take one capsule by mouth every 12 hours for 7 days. Take with food.  Indications: genitourinary tract infections     Dispense:  14 capsule     Refill:  0     Collaborating / Supervising Provider:   RING, HOPE A N [[id number]]    phenazopyridine (PYRIDIUM) 200 mg tablet     Sig: Take one tablet by mouth three times daily as needed for Pain. Take after meals for up to 2 days.     Dispense:  6 tablet     Refill:  0     Collaborating / Supervising Provider:   RING, HOPE A N [[id number]]     Patient Instructions   You were given a prescription for Macrobid to take twice a day for 7 days and Pyridium to take every 8 hours as needed for urinary symptoms.  Please make sure to drink plenty of water to help flush out the infection.     Orders were placed today for a urine test and culture to be done.  Please go to one of our out patient labs as soon as you can to leave a urine sample for testing before starting the antibiotics.  We will notify you of your results and if a change of treatment is needed.     Follow up with your primary care provider or to an in person urgent care if your symptoms worsen, fail to improve, or you have any other urgent/emergent concerns.    Please check MyChart for your results which may take up to 72 hours.   We will send a MyChart results message but attempt to call you if you still need to sign up for MyChart.   Information to sign up to MyChart is on your After Visit Summary (AVS)  The MyChart help number is [phone], and the nurse triage number is [phone].     Laboratory: [facility]   [address]   [place], [place] [address]     [phone]     Hours   Monday  7:30 a.m. - 5 p.m.    Tuesday  7:30 a.m. - 5 p.m.    Wednesday  7:30 a.m. - 5 p.m.    Thursday  7:30 a.m. - 5 p.m.    Friday  7:30 a.m. - 5 p.m.    Saturday  Closed    Sunday  Closed  Laboratory: [facility]   [address] [place], [place] [address]     [phone]     Hours   Monday  8:00 AM - 4:30 PM    Tuesday  8:00 AM - 4:30 PM    Wednesday  8:00 AM - 4:30 PM    Thursday  8:00 AM - 4:30 PM    Friday  8:00 AM - 4:30 PM    Saturday  Closed    Sunday  Closed        Laboratory: [place]   [address]   [place], [place] [address]     [phone]     Hours   Monday  7:00 AM - 5:30 PM    Tuesday  7:00 AM - 5:30 PM    Wednesday  7:00 AM - 5:30 PM    Thursday  7:00 AM - 5:30 PM    Friday  7:00 AM - 5:30 PM    Saturday  7:00 AM - 12:00 PM    Sunday  7:00 AM - 12:00 PM        [facility]: [facility]   [address]   [place], [place] [address]     [phone]     Hours   Monday  7:00 AM - 6:00 PM    Tuesday  7:00 AM - 6:00 PM    Wednesday  7:00 AM - 6:00 PM    Thursday  7:00 AM - 6:00 PM    Friday  7:00 AM - 6:00 PM    Saturday  7:00 AM - 12:00 PM    Sunday  Closed Laboratory: [facility]   [address]   [place], [place] [address]     [phone]     Hours   Monday  8:00 AM - 4:30 PM    Tuesday  8:00 AM - 4:30 PM    Wednesday  8:00 AM - 4:30 PM    Thursday  8:00 AM - 4:30 PM    Friday  8:00 AM - 4:30 PM    Saturday  Closed    Sunday  Closed        Laboratory: [facility]   [address]  [place], [place] [address]    [phone]    Hours   Monday  7:00 a.m. - 5 p.m.    Tuesday  7:00 a.m. - 5 p.m.    Wednesday  7:00 a.m. - 5 p.m.    Thursday  7:00 a.m. - 5 p.m.    Friday  7:00 a.m. - 5 p.m.    Saturday  Closed    Sunday  Closed        Laboratory: [facility]  [address]  [place], [place] [address]    [phone]    Hours   Monday  7:00 a.m. - 5 p.m.    Tuesday  7:00 a.m. - 5 p.m.    Wednesday  7:00 a.m. - 5 p.m.    Thursday  7:00 a.m. - 5 p.m.    Friday  7:00 a.m. - 5 p.m.    Saturday  Closed    Sunday  Closed

## 2023-08-19 NOTE — Patient Instructions
 You were given a prescription for Macrobid to take twice a day for 7 days and Pyridium to take every 8 hours as needed for urinary symptoms.  Please make sure to drink plenty of water to help flush out the infection.     Orders were placed today for a urine test and culture to be done.  Please go to one of our out patient labs as soon as you can to leave a urine sample for testing before starting the antibiotics.  We will notify you of your results and if a change of treatment is needed.     Follow up with your primary care provider or to an in person urgent care if your symptoms worsen, fail to improve, or you have any other urgent/emergent concerns.    Please check MyChart for your results which may take up to 72 hours.   We will send a MyChart results message but attempt to call you if you still need to sign up for MyChart.   Information to sign up to MyChart is on your After Visit Summary (AVS)  The MyChart help number is [phone], and the nurse triage number is [phone].     Laboratory: [facility]   [address]   [place], [place] [address]     [phone]     Hours   Monday  7:30 a.m. - 5 p.m.    Tuesday  7:30 a.m. - 5 p.m.    Wednesday  7:30 a.m. - 5 p.m.    Thursday  7:30 a.m. - 5 p.m.    Friday  7:30 a.m. - 5 p.m.    Saturday  Closed    Sunday  Closed        Laboratory: [facility]   [address] [place] [address]     [phone]     Hours   Monday  8:00 AM - 4:30 PM    Tuesday  8:00 AM - 4:30 PM    Wednesday  8:00 AM - 4:30 PM    Thursday  8:00 AM - 4:30 PM    Friday  8:00 AM - 4:30 PM    Saturday  Closed    Sunday  Closed        Laboratory: [place], [facility]   [address]   [place], [place] [address]     [phone]     Hours   Monday  7:00 AM - 5:30 PM    Tuesday  7:00 AM - 5:30 PM    Wednesday  7:00 AM - 5:30 PM    Thursday  7:00 AM - 5:30 PM    Friday  7:00 AM - 5:30 PM    Saturday  7:00 AM - 12:00 PM Sunday  7:00 AM - 12:00 PM        Laboratory: [facility]   [address]   [place], [place] [address]     [phone]     Hours   Monday  7:00 AM - 6:00 PM    Tuesday  7:00 AM - 6:00 PM    Wednesday  7:00 AM - 6:00 PM    Thursday  7:00 AM - 6:00 PM    Friday  7:00 AM - 6:00 PM    Saturday  7:00 AM - 12:00 PM    Sunday  Closed        Laboratory: [facility]  [address]   [place], [place] [address]     [phone]     Hours   Monday  8:00 AM - 4:30 PM    Tuesday  8:00 AM - 4:30 PM    Wednesday  8:00 AM - 4:30 PM    Thursday  8:00 AM - 4:30 PM    Friday  8:00 AM - 4:30 PM    Saturday  Closed    Sunday  Closed        Laboratory: [facility]   [address]  [place], [place] [address]    [phone]    Hours   Monday  7:00 a.m. - 5 p.m.    Tuesday  7:00 a.m. - 5 p.m.    Wednesday  7:00 a.m. - 5 p.m.    Thursday  7:00 a.m. - 5 p.m.    Friday  7:00 a.m. - 5 p.m.    Saturday  Closed    Sunday  Closed        Laboratory: [facility]  [address]  [place], [place] [address]    [phone]    Hours   Monday  7:00 a.m. - 5 p.m.    Tuesday  7:00 a.m. - 5 p.m.    Wednesday  7:00 a.m. - 5 p.m.    Thursday  7:00 a.m. - 5 p.m.    Friday  7:00 a.m. - 5 p.m.    Saturday  Closed    Sunday  Closed

## 2023-08-20 ENCOUNTER — Emergency Department: Admit: 2023-08-20 | Discharge: 2023-08-21 | Disposition: A | Discharge status: Home / Self Care

## 2023-08-21 ENCOUNTER — Encounter: Admit: 2023-08-21 | Discharge: 2023-08-21

## 2023-08-22 ENCOUNTER — Encounter: Admit: 2023-08-22 | Discharge: 2023-08-22

## 2023-08-22 DIAGNOSIS — G4700 Insomnia, unspecified: Secondary | ICD-10-CM

## 2023-08-22 MED ORDER — TRAZODONE 50 MG PO TAB
50 mg | ORAL_TABLET | Freq: Every evening | ORAL | 2 refills | Status: AC | PRN
Start: 2023-08-22 — End: ?

## 2023-08-22 NOTE — Telephone Encounter
 Request received for refill on patient's Trazodone.  Last office visit with Dr. Rebekah Chesterfield.    Last refilled on 07/25/23 with #30 and 0 refills.    Routed to Provider for review and advise.

## 2023-08-22 NOTE — Telephone Encounter
 Okay to refill?

## 2023-08-22 NOTE — Telephone Encounter
 Called pt and LVM to notify that her Leqvio SOB came back and it will be covered at 100%. Advised that we can get her scheduled for her first injection as a nurse visit if she would like, and to let us know her availability. Direct callback number provided. [facility] sent as well.

## 2023-08-22 NOTE — Telephone Encounter
 Spoke with pt regarding her first Leqvio injection. She states that she just started abx and they are not making her feel the best, so is wondering if she can do her injection next week. Advised that is a good idea, and scheduled for a nurse visit on 4/10 at [facility]. Pt verbalizes understanding and is aware of DTL.

## 2023-08-23 ENCOUNTER — Encounter: Admit: 2023-08-23 | Discharge: 2023-08-23

## 2023-08-23 MED FILL — RXAMB NALTREXONE 1 MG ORAL CAPSULE (COMPOUND): ORAL | 30 days supply | Qty: 0.09 | Fill #1 | Status: AC

## 2023-08-23 NOTE — Telephone Encounter
 Marcell Barlow [name] to [facility] (supporting [facility] Lewayne Bunting, MD)         08/22/23  9:07 PM  Hi Raquel and Dr. Jacqulynn Cadet,  Last Friday morning I experienced blood in urine (rusty-color with a small bit of red tissue) and severe bladder spasm. I took 3mg  tizandine (which helped) and had a teleheath Urgent care appointment. Please see my chart. I had a urinalysis (dip) and culture, then started Macrobid and Pyridium. The next day the NP messaged me to stop the macrobid and go to the ER or Urgent care because the culture showed no growth. I went to [place], they did not ask for another specimen to do a microscopic analysis. They started me on Keflex.   My concern is there is still transient bladder soreness, and should I even be taking an antibiotic if it is not bacterial? Could I have kidney stones or stones in my bladder? Gout? Should we check uric acid in blood? Should we do an ultrasound? If this is not a bacterial URI then what else should be differentially diagnosed? What if, despite the recent CT, there is undetected cancer?  Meanwhile, I have continued discomfort. It is disheartening.  Thank you for your thoughts on this,  [name]

## 2023-08-23 NOTE — Telephone Encounter
 ED Discharge Follow Up  Reached patient: Yes, Direct Clinic Contactwith PCP clinic 08/23/23, TOC short note completed   Patient Date of Birth: 05/23/1961  Admission Information  Hospital Name : [facility]  ED Admission Date: 08/20/23   ED Discharge Date: 08/20/23   Admission Diagnosis:  Blood in Urine  Discharge Diagnosis: Acute cystitis with hematuria   Hospital Services: Unplanned  Today's call is 3 (calendar) days post discharge    Medication Reconciliation  Changes to pre-ED visit medications? Yes  START taking:  cephalexin [facility])  Were new prescriptions filled? N/A  Meds reviewed and reconciled? Yes    Current Outpatient Medications   Medication Instructions    acetaminophen (TYLENOL) 650 mg, Oral, EVERY  6 HOURS, Take scheduled for 3 days after surgery, then as needed. Do not exceed 4,000mg  in a 24 hour period.    amLODIPine (NORVASC) 5 mg, Oral, DAILY    aspirin 81 mg, Oral, DAILY    biotin 1 mg, DAILY    calcipotriene (DOVONEX) 0.005 % topical cream Apply twice daily to forehead and temple for 4 days.    calcium carbonate (OS-CAL) 1,250 mg, DAILY    cephalexin (KEFLEX) 500 mg, Oral, EVERY 12 HOURS    CHILDREN'S PAIN-FEVER RELIEF 325 mg, Oral, EVERY  6 HOURS PRN    CHOLEcalciferoL (vitamin D3) 5000 unit tablet 1 tablet, DAILY    clobetasoL (TEMOVATE) 0.05 % topical ointment Topical, TWICE DAILY, Apply to areas of rash on hands twice daily; do NOT use on face/groin/underarms, use up to 2 weeks per month    diazePAM (VALIUM) 2 mg tablet Please bring to mohs appointment, must have a driver, also please do not take until consent is signed    diclofenac sodium (VOLTAREN) 4 g, DAILY  PRN    ezetimibe (ZETIA) 10 mg, Oral, DAILY    Fish,Bora,Flax Oils-OM3,6,9 #1 (TRIPLE OMEGA) 400-400-400 mg capsule 1 capsule, AT BEDTIME DAILY    INFANTS GAS RELIEF 40 mg, Oral, EVERY  6 HOURS PRN    lidocaine hcl viscous (LIDOCAINE VISCOUS) 2 % solution 5 mL, Swish & Swallow, FOUR TIMES DAILY PRN metoprolol succinate XL (TOPROL XL) 25 mg, Oral, AT BEDTIME DAILY    Miscellaneous Medical Supply misc DME requirements, rx to include the following typed information:    NPI: [id number]  Doctor's typed name: Assunta Curtis, MD  ICD 10: G47.33  Length of need: 99 months  Start date:02/14/2023  Signature date: 02/14/2023  Detailed equipment: CPAP machine with any accessories.    naltrexone 1 mg oral capsule (COMPOUND) 0.003 g, Oral, DAILY    nitrofurantoin monohyd/m-cryst (MACROBID) 100 mg capsule 100 mg, Oral, EVERY 12 HOURS, Take with food.    omeprazole DR (PRILOSEC) 40 mg, Oral, DAILY, Open the capsule in apple juice for 2 months, then stop.    ondansetron (ZOFRAN ODT) 4 mg, Oral, EVERY  6 HOURS PRN, Place on tongue to dissolve.    other medication 1 Dose, DAILY    peg 3350-electrolytes (GOLYTELY) 236-22.74-6.74 -5.86 gram oral solution Mix as directed on package.Drink Slowly over 24 hours, you can eat and drink normally.    phenazopyridine (PYRIDIUM) 200 mg, Oral, THREE TIMES DAILY PRN, Take after meals for up to 2 days.    polyethylene glycol 3350 (MIRALAX) 17 g, Oral, DAILY, Increase as needed in order to have 1-2 soft bowel movements per day without straining.    promethazine (PROMETHEGAN) 12.5 mg, Rectal, EVERY  6 HOURS PRN    rosuvastatin (  CRESTOR) 10 mg, Oral, DAILY    Selenium 100 mcg tab 1 tablet, DAILY    sodium,potassium,mag sulfates (SUPREP BOWEL PREP KIT) 17.5-3.13-1.6 gram oral solution Evening prior: mix 1 bottle with 16 ounces of water and drink orally, followed by 32 additional ounces of water.  Day of: mix 2nd bottle and drink orally, followed by 32 ounces of water. Complete the bowel preparation at least 2 hrs before procedure.    sucralfate (CARAFATE) 1 g, Oral, EVERY  6 HOURS, Take for 4 weeks following surgery.    SUMAtriptan succinate (IMITREX) 25 mg tablet Take one tablet by mouth at onset of headache. May repeat after 2 hours if needed. Max of 200 mg in 24 hours. SYNTHROID 100 mcg, Oral, DAILY BEFORE BREAKFAST    tiZANidine (ZANAFLEX) 2 mg tablet Take one tablet by mouth twice daily as needed.    traZODone (DESYREL) 50 mg, Oral, AT BEDTIME PRN    vit A/vit C/vit E/zinc/copper (PRESERVISION AREDS PO) 1 tablet, DAILY    vitamins, B complex tab 1 tablet, DAILY    vitamins, multiple cap 1 capsule, DAILY        Scheduling Follow-up Appointment  Upcoming appointments:   Future Appointments   Date Time Provider Department Center   08/24/2023  9:30 AM Renata Caprice CPKPT [facility]   08/25/2023  8:15 AM Liberty Handy, PhD [facility] Psychiatry   08/25/2023  9:15 AM NEURO PSYCHOMETRICIAN MPAPSYCH Psychiatry   08/29/2023  2:15 PM Zada Finders, MD RVFDERM IM   09/01/2023 12:00 PM [place] NURSE CVMCLOP CVM Exam   09/13/2023 10:40 AM Arn Medal, MD OCARHEUM [facility]   09/21/2023 11:30 AM de Daine Gip, MD CMPPLSUR Plastic Surg   09/26/2023 12:40 PM Lewayne Bunting, Avie Echevaria, MD MPGENMED IM   09/28/2023  9:20 AM Mariel Aloe, MD CMPBENT ENT   10/10/2023  8:00 AM Lizbeth Bark, MD ASCICCPP None   10/13/2023 12:30 PM Lizbeth Bark, MD ASCICCPP None   10/13/2023  1:15 PM Oletta Lamas, MD QVUROBGY OB/GYN   10/31/2023 10:00 AM Floydene Flock, APRN-NP CCC2 [place] Exam   11/03/2023  9:30 AM Glyn Ade, MD MPAPDERM IM   11/08/2023 10:40 AM Drucilla Schmidt, MBBS MPAPULM IM   11/10/2023  9:30 AM Bonney Aid, APRN-NP MPB5CVM CVM Exam   12/05/2023  9:40 AM Jolee Ewing, MD QVAGASTR IM   02/09/2024 10:00 AM Felipa Emory, APRN-NP [facility] [place] [facility]     When was patient?s last PCP visit: 05/23/2023  PCP primary location: [facility]  PCP appointment scheduled? Yes, Date: 09/26/23   Specialist appointment scheduled? Yes, with psychiatry  08/25/23  Is assistance with transportation needed?  Unable to assess  MyChart message sent? Active in MyChart. No message sent.   Artera text sent? No      ED Communication   Did patient call clinic prior to going to ED? No  Reason patient went to ED: Unable to obtain    Micah Noel, RN

## 2023-08-23 NOTE — Telephone Encounter
 Person Calling (Patient, Partner, Friend, etc.): Pt  Permission to Communicate on File N/A  Maurine Mowbray / [birth year]    Chief Complaint   Patient presents with    Bladder Pain     GM IM. Went to ED and UC 3/29, blood in urine, negative culture        Onset of Symptoms: Pt has been seen in ED and UC, started tx for UTI then received call to dc Macrobid. Then was started on Keflex.    Summary: Pt has concerns that she needs add'l testing... microscopic? Sono? Pt would like to be very sure there are no stones or cancer brewing.    Disposition: Pt was reminded to drink plenty of water. , avoid OJ, stimulant for bladder.      Following Disposition: Please contact pt, would like Dr Velta Addison input please.

## 2023-08-24 ENCOUNTER — Encounter: Admit: 2023-08-24 | Discharge: 2023-08-24

## 2023-08-25 ENCOUNTER — Ambulatory Visit: Admit: 2023-08-25 | Discharge: 2023-08-25

## 2023-08-25 ENCOUNTER — Encounter: Admit: 2023-08-25 | Discharge: 2023-08-25

## 2023-08-25 ENCOUNTER — Ambulatory Visit: Admit: 2023-08-25 | Discharge: 2023-08-26

## 2023-08-26 ENCOUNTER — Encounter: Admit: 2023-08-26 | Discharge: 2023-08-26

## 2023-08-26 ENCOUNTER — Ambulatory Visit: Admit: 2023-08-26 | Discharge: 2023-08-27

## 2023-08-29 ENCOUNTER — Ambulatory Visit: Admit: 2023-08-29 | Discharge: 2023-08-30

## 2023-08-29 ENCOUNTER — Encounter: Admit: 2023-08-29 | Discharge: 2023-08-29

## 2023-08-29 DIAGNOSIS — L578 Other skin changes due to chronic exposure to nonionizing radiation: Secondary | ICD-10-CM

## 2023-08-29 DIAGNOSIS — L570 Actinic keratosis: Secondary | ICD-10-CM

## 2023-08-29 DIAGNOSIS — L814 Other melanin hyperpigmentation: Secondary | ICD-10-CM

## 2023-08-29 DIAGNOSIS — D229 Melanocytic nevi, unspecified: Secondary | ICD-10-CM

## 2023-08-29 DIAGNOSIS — L821 Other seborrheic keratosis: Secondary | ICD-10-CM

## 2023-08-29 NOTE — Patient Instructions
 Thank you for choosing [facility]. I am happy that you are allowing me to participate in your care.     A summary of the recommendations made at today's visit is below:  WOUND CARE INSTRUCTIONS    You have recently undergone a biopsy, excision, or scrape/burn procedure of a skin lesion.  The following are instructions on wound care and answers to commonly asked questions:      What supplies do I need?  It is recommended that you have or get the following:  Plain Vaseline or Aquaphor/Eucerin Healing Ointment. You DO NOT need an antibiotic ointment, rubbing alcohol, hydrogen peroxide, or other medicated/prescription topical medication.  A non-stick dressing (such as TELFA), or other bandage.       How do I care for the site?   Keep the surgical dressing DRY AND IN PLACE for the first 24 hours. If you need to bathe or shower, please cover with a plastic bag or wrap.     After 24 hours, remove the bandage and gently cleanse the wound with warm soap and water.  If the original dressing sticks to the wound, soak dressing in water before attempting removal. A cotton swab or clean gauze can be used to clean the surface. DO NOT SCRUB VIGOROUSLY--simply allowing the warm soapy water to run over the site will suffice.     Apply a thin layer of Vaseline (petroleum jelly) or Aquaphor/Eucerin Healing Ointment and cover with TELFA or other non-stick dressing.  Hold in place with medical tape or other bandage.    The bandage does not have to be changed as long as the site is clean, bandage is intact, and wound is covered by ointment.  However, it is fine to reapply the ointment and bandage whenever necessary, about every 1-2 days.  The site should remain covered until the wound is completely healed (about 7-10 days). This will help ensure the optimal healing time, minimize the appearance of the scar, help prevent infection or other complication, and allow sutures (if used) to be removed with ease and no/minimal discomfort.      What if the site starts to bleed?  First, you should remove all bandages.      Sit or lie down and apply firm pressure for 30 minutes with a clean, dry bandage.  Watch the clock and do not remove the bandage to check the site during the 30 minute.     If bleeding continues, repeat.  If the site continues to bleed after the second 30 minutes of pressure, contact your physician. If it is after standard office hours, call the [facility] Operator [phone]) and ask to have the on call Dermatology physician paged.        Will there be pain?  Pain is typically minimal.  If you do have pain, and your other medical conditions allow, Tylenol or Ibuprofen is recommended if you are able to take them based on your health history.  You may alternate with both if you are able to take them based on your health history. Please do not exceed the daily limit based on the dosing instructions on the bottle or as advised by your other health care providers if applicable.    If medically possible, avoid all medications that contain aspirin for at least 1 week (Bufferin, Anacin, Excedrin, and BC Powder all contain aspirin).      IMPORTANT: If the pain is severe and does not improve with time (24-48 hours), contact your physician.  Do I need to have sutures removed?  If sutures were used to close the skin, you will need to have the sutures removed. You can return to the Dermatology Clinic and have these removed by a nurse.  Typically, sutures on the face are removed after 5 to 7 days.  Sutures on the trunk and extremities are removed between 10 and 14 days.        Is the site infected?  It is very uncommon to get an infection following a skin procedure.  Redness is expected near the site. This is typically due to inflammation as your body attempts to repair the area.  Sometimes an allergy will develop to the adhesive in the bandage that will result in redness and itching.      IMPORTANT: If your wound is becoming more painful with time, the redness around the area is spreading, there is a discharge, and/or you develop fevers, you should contact your physician IMMEDIATELY.      When will the results be available?  Your biopsy will be sent to an expert dermatopathologist for evaluation.  This usually takes about one week, but may be faster.  However, depending on the complexity, it can take 2 weeks or longer.      IMPORTANT: If you have not received notification of results by MyChart or telephone in 2 weeks, please contact the office by sending a MyChart message or calling.      If the lesion is benign/non-cancerous, you will receive a message or letter with a copy of the pathology report. If there are abnormal findings, you will be notified of the results and subsequent plan by a phone call from our practice or via MyChart.  Should any further treatment be needed, you will be notified at that time.      If you are referred for further treatment to another provider, a copy of the pathology report will be forwarded to their office.  When you schedule your appointment with the surgeon, please notify our office with the name of the surgeon and your appointment date and time.      Will I have a scar?  The skin heals by forming a scar, which is usually permanent.  There are no exceptions. On rare occasions, it may take 4-6 weeks before the wound heals entirely, and 6-12 months for the scar to flatten and the redness to fade (all of this takes longest on trunk and legs).  If you are concerned about the appearance of your scar at any point, please have [place] re-examine you.       Please don't hesitate to let me know if you have additional questions or concerns. My goal is for you to have the best healing process, outcome, and overall care as possible!        CRYOTHERAPY INSTRUCTIONS    What should I expect after cryosurgery?  2 -- 24 hours:  Redness and swelling occurs almost immediately; a firm blister may form that may contain blood or clear fluid.    1 -- 2 days:  The blister (if present) may break and a clear to yellowish drainage and/or crust may develop.  Some bleeding may occur.  The site should not be noticeably painful.  3 -- 10 days:  Crusting and drainage will continue, but the swelling will decrease.  The redness will begin resolving.  10 days - 3 weeks:  Healing will continue, with residual redness.  3 - 6 weeks:  Complete healing.  How do I care for the treated area?  Wash the treated areas daily. Allow soapy water to run over the areas, but do not scrub.  Should a scab or crust form, allow it to fall off on its own - do not remove or ?pick? at it.  The crust is protective to the new, healthy tissue growing underneath.    Application of ointment and a bandage may make you feel more comfortable, but is not necessary. If you would like to use an ointment to the cryotherapy site, we recommend Vaseline (petroleum jelly) or Aquaphor/Eucerin healing ointment     Will there be pain?  Pain is typically minimal. If you experience pain following the procedure, and your other medical conditions allow, Tylenol may be used.      If pain is severe, contact your physician.     Long term cryotherapy care  The cryotherapy site will be more sun sensitive than your surrounding skin.  Keep it covered, and remember to apply sun screen every day to all your sun exposed skin.  A scar may remain which is lighter or pinker than your normal skin. Your body will continue to improve your scar for up to one year, however a light colored scar may remain.  Infection following cryotherapy is rare. However, if you are worried by the appearance the treated area, contact your doctor.     General recommendations  Applying creams/ointments  If you were prescribed a topical medication, a small amount will suffice. A line of cream/ointment placed on your fingertip to the first joint (where the fingertip bends) should be sufficient to cover a surface area of the size of the palm of your hand. You do not need a thick layer the prescription cream/ointment.     Your rash may return after application of the prescription topical medicine. Repeat the course of treatment as discussed in your visit with me. Please let me know if you don't get enough relief, the rash spreads, or if new or worsening symptoms develop.    Gentle skin care for everyday  - Bathe with lukewarm water, 5-10 minutes. Dry thoroughly afterward by patting yourself with a towel. Avoid heavy rubbing  - Use a mild soap, such as Dove, Cetaphil, or any fragrance-free product  - Apply a moisturizer of your choice as much as you can (there's no limit like in prescription topical medications), but at least 2 times a day (even if no bath is taken) and after every bath.  After the bath, pat dry and apply the moisturizer right away. Use a thick moisturizer that says cream or ointment (not lotion that comes in a pump bottle - this is not as moisturizing, but does work). The stickier the better (like Vaseline or Aquaphor)! However, find one that you like and will use.  - For your face, you may use a gentle cleanser (such as CeraVe or Cetaphil just as examples) or plain water, followed by a non-comedogenic (meaning it won't clog pores) moisturizer with sunscreen SPF 30 or higher. A mineral sunscreen with zinc, titanium, or iron oxide are preferable      Sun protection is KEY to success in taking care of your skin!  Using sunscreens and sun avoidance will decrease your risk of developing skin cancers like melanoma, as well as prevent developing signs of aging like wrinkles, redness, and brown/dark spots.  - Avoid the sun at peak hours of the day (10 AM - 4 PM)   - Wear sunscreen  SPF 30 or above. It should be reapplied every 2 hours if possible, or when you wipe off with a towel after swimming  - Use sunprotective clothing (shirts with long sleeves, pants, and wide-brimmed cowboy style hat). Baseball caps don't work well enough!    ** FOR MOISTURIZERS AND SUNSCREENS: I RECOMMEND ANY PRODUCT THAT YOU LIKE AND WILL USE CONSISTENTLY -- ANY BRAND/PRICE IS ACCEPTABLE **    Moles and other brown spots   - Common melanocytic nevi, or moles, tend to be less than 6 mm in diameter (size of a pencil eraser) and symmetric with even pigmentation, round or oval shape, regular outline, and sharp, non-fuzzy border. They can stick out from the skin.  - Flaky, seemingly stuck-on brown bumps are usually benign/non-cancerous keratoses, not moles. You will likely get more of these with time.  - Bright red smooth bumps that do not bleed are usually benign blood vessel lesions (cherry angiomas), not moles.  - It is normal to get new moles until the age of 34-34 years old.    Please contact me if you notice the following:  - A mole that differs from others (an ugly duckling that has an odd shape, uneven or uncertain border, different colors), or one that changes, bleeds, or itches.  - Any growth that has one or more of the following features:  A - Asymmetry. (Concerning if spot is not symmetric--one half is unlike the other half)  B - Border. (Irregular, notched, or poorly defined border are concerning)  C - Color. (Multiple colors [shades of tan, brown or black, or is sometimes white, red, or blue] or changes in color are concerning)  D - Diameter. (Larger than 6mm, i.e., a pencil eraser)  E - Evolution. (An evolving or changing spot, which looks different from others on your body. If there's a new itch, tenderness, or bleeding develop, these are concerning changes too.  - An uneven, new, or large (greater than 3mm) brown or black streak underneath a fingernail or toenail, which may or may not spread onto the skin next to the nail  - A sore that just won't heal after about 4 weeks    Cost of prescriptions  We do everything that we can to get your prescriptions approved through your insurance company. Unfortunately, our efforts may not always work. I understand this is frustrating. Some options to help make your prescription medications more affordable include GoodRx (https://www.goodrx.com/) and [facility] (https://costplusdrugs.com/medications/categories/hair-&-skin-health/). Please let me know if your prescription is too expensive at your regular pharmacy and we can try one of those options. Even if it is covered by your insurance, your pharmacy may not communicate with [place].          Contact info  Routine - Please don't hesitate to let me know if you have additional questions or concerns before your next visit. I recommend directing your inquiries through MyChart. However, if you do not have MyChart access or prefer calling, the phone number is [phone], Option 4.   Urgent - For urgent, after-hours/weekend issues that are not emergencies, please call the [facility] Operator at [phone]. Ask to have paged the dermatologist on call. You will receive a call back from the dermatologist.  Emergency - Please call 911 or go to the nearest Emergency Department for all medical emergencies.         Zada Finders, MD MSc FAAD  Assistant Professor - Dermatology  Department of Internal Medicine  [employer]  [facility]

## 2023-08-29 NOTE — Progress Notes
 Date of Service: 08/29/2023     Dermatology Problem List:   # Dyshidrotic Eczema  - Clobetasol 0.5% ointment    # AK   - LN2    # Personal history of skin cancer: Yes  NMSC  Cancer type Location Date of bx Accession # Procedure type/date   [facility] Left cheek 06/01/2023 [id number]  MMS Dr. Lowell Guitar 07/20/2023            # FH NMSC - mother    # History of Breast Cancer S/p Radiation and Chemotherapy     Subjective:             Cindy Randolph is a 63 y.o. female.    History of Present Illness  Return  PATIENT. Last seen 06/2023 with Dr. Cecille Rubin when the following was addressed:     raised/bumpy growth(s)  Location: left cheek under biopsied BCC  Duration: few weeks  Associated local and/or systemic symptoms: none  Palliative factors: none  Provocative factors: none  Prior evaluation: none  Prior investigation: none  Previous treatment: none  Current treatment: none     The left temple scaly area has still persisted after cryotherapy. No growth    Not yet started the chemo cream to forehead and temples.    Today, she presents with the following concerns:     lumps or bumps  Location: right cheek   Duration: weeks  Associated local and/or systemic symptoms: unsightliness, pain, non-healing  Palliative factors: none  Provocative factors: none  Prior evaluation: none  Previous treatment: none  Current treatment: none    She reports pulling of the skin with eye movement on left s/p MMS (tolerated well). Will ask plastic surgeon for second opinion re: options.       Review of Systems   Constitutional:  Negative for appetite change, diaphoresis, fatigue, fever and unexpected weight change.   HENT:  Negative for congestion, mouth sores and sore throat.    Eyes:  Negative for pain, redness, itching and visual disturbance.   Respiratory:  Negative for cough and shortness of breath.    Cardiovascular:  Negative for palpitations and leg swelling.   Gastrointestinal:  Negative for abdominal pain, blood in stool, diarrhea, nausea and vomiting.   Genitourinary:  Negative for difficulty urinating and hematuria.   Musculoskeletal:  Negative for arthralgias and myalgias.   Neurological:  Negative for dizziness, seizures and facial asymmetry.   Hematological:  Does not bruise/bleed easily.   Psychiatric/Behavioral:  Negative for confusion and dysphoric mood. The patient is not nervous/anxious.      Objective:          acetaminophen (TYLENOL) 160 mg/5 mL oral suspension Take 10.2 mL by mouth every 6 hours as needed. Indications: pain    acetaminophen (TYLENOL) 325 mg tablet Take two tablets by mouth every 6 hours. Take scheduled for 3 days after surgery, then as needed. Do not exceed 4,000mg  in a 24 hour period.    amLODIPine (NORVASC) 5 mg tablet Take one tablet by mouth daily.    aspirin 81 mg chewable tablet Chew one tablet by mouth daily.    biotin 1 mg cap Take one capsule by mouth daily.    calcipotriene (DOVONEX) 0.005 % topical cream Apply twice daily to forehead and temple for 4 days.    calcium carbonate (OS-CAL) 1250 mg (500 mg elemental calcium) tablet Take one tablet by mouth daily.    CHOLEcalciferoL (vitamin D3) 5000 unit tablet Take one tablet by mouth  daily.    clobetasoL (TEMOVATE) 0.05 % topical ointment Apply  topically to affected area twice daily. Apply to areas of rash on hands twice daily; do NOT use on face/groin/underarms, use up to 2 weeks per month    diazePAM (VALIUM) 2 mg tablet Please bring to mohs appointment, must have a driver, also please do not take until consent is signed    diclofenac sodium (VOLTAREN) 1 % topical gel Apply four g topically to affected area daily as needed.    ezetimibe (ZETIA) 10 mg tablet Take one tablet by mouth daily.    Fish,Bora,Flax Oils-OM3,6,9 #1 (TRIPLE OMEGA) 400-400-400 mg capsule Take one capsule by mouth at bedtime daily.    lidocaine hcl viscous (LIDOCAINE VISCOUS) 2 % solution Swish and Swallow 5 mL by mouth as directed four times daily as needed. Indications: suppression of the gag reflex    metoprolol succinate XL (TOPROL XL) 25 mg extended release tablet Take one tablet by mouth at bedtime daily.    Miscellaneous Medical Supply misc DME requirements, rx to include the following typed information:    NPI: [id number]  Doctor's typed name: Oswaldo Blessing, MD  ICD 10: G47.33  Length of need: 99 months  Start date:02/14/2023  Signature date: 02/14/2023  Detailed equipment: CPAP machine with any accessories.    naltrexone 1 mg oral capsule (COMPOUND) Take three capsules by mouth daily.    omeprazole DR (PRILOSEC) 40 mg capsule Take one capsule by mouth daily. Open the capsule in apple juice for 2 months, then stop.  Indications: gastroesophageal reflux disease    ondansetron (ZOFRAN ODT) 4 mg rapid dissolve tablet Dissolve one tablet by mouth every 6 hours as needed for Nausea. Place on tongue to dissolve.  Indications: prevent nausea and vomiting after surgery    other medication Take one Dose by mouth daily. K Complete K1 & K2 as MK-4 & MK-7 : Take 1 softgel by mouth once daily    phenazopyridine (PYRIDIUM) 200 mg tablet Take one tablet by mouth three times daily as needed for Pain. Take after meals for up to 2 days.    polyethylene glycol 3350 (MIRALAX) 17 g packet Take one packet by mouth daily. Increase as needed in order to have 1-2 soft bowel movements per day without straining.  Indications: constipation    promethazine (PROMETHEGAN) 12.5 mg rectal suppository Insert or Apply one suppository to rectal area as directed every 6 hours as needed for Nausea. Indications: prevent nausea and vomiting after surgery    rosuvastatin (CRESTOR) 10 mg tablet Take one tablet by mouth daily.    Selenium 100 mcg tab Take one tablet by mouth daily.    simethicone (MYLICON) 40 mg/0.6 mL drops Take 0.6 mL by mouth every 6 hours as needed. Indications: For gas pain    sodium,potassium,mag sulfates (SUPREP BOWEL PREP KIT) 17.5-3.13-1.6 gram oral solution Evening prior: mix 1 bottle with 16 ounces of water and drink orally, followed by 32 additional ounces of water.  Day of: mix 2nd bottle and drink orally, followed by 32 ounces of water. Complete the bowel preparation at least 2 hrs before procedure.    sucralfate (CARAFATE) 100 mg/mL oral suspension Take 10 mL by mouth every 6 hours. Take for 4 weeks following surgery.  Indications: stress ulcer prevention    SUMAtriptan succinate (IMITREX) 25 mg tablet Take one tablet by mouth at onset of headache. May repeat after 2 hours if needed. Max of 200 mg in 24 hours.  SYNTHROID 100 mcg tablet Take one tablet by mouth daily 30 minutes before breakfast.    tiZANidine (ZANAFLEX) 2 mg tablet Take one tablet by mouth twice daily as needed.    traZODone (DESYREL) 50 mg tablet TAKE 1 TABLET BY MOUTH AT BEDTIME AS NEEDED FOR SLEEP    vit A/vit C/vit E/zinc/copper (PRESERVISION AREDS PO) Take 1 tablet by mouth daily.    vitamins, B complex tab Take one tablet by mouth daily.    vitamins, multiple cap Take one capsule by mouth daily.     Vitals:    08/29/23 1444   PainSc: Zero   Weight: 70.3 kg (155 lb)   Height: 158.8 cm (5' 2.5)       Body mass index is 27.9 kg/m?Marland Kitchen     Physical Exam    Areas Examined (all normal unless noted below):  Head/Face    Pertinent findings include:    - right cheek, pearly, erythematous papule, with arborizing vessels and milky white and pink structures/areas (shave)    - left cheek, with geometric, white, firm, hypertrophic plaque(s) without repigmentation or nodularity    - face yellowish, tan papules with central umbilication on the forehead and cheeks    - Head/neck with brown, symmetric macules and papules with regular pigment network on dermoscopy  - Head/neck with tan and brown, waxy, stuck-on, thin papules  - Head/neck with photo-distributed tan feathery macules         Assessment and Plan:    # Neoplasm of uncertain behavior of the skin (NMSC vs HAK vs ISK vs other) of the Right cheek     The patient has a nonspecific eruption/growth of uncertain significance at this time. This author recommends biopsy, which may be helpful in better delineating the underlying process and determining whether additional investigation and treatment may be needed. The patient agrees to proceed with biopsy, as outlined below.    Differential diagnosis: as above    Tangential biopsy procedure note  Location:    Right Cheek   Time out was performed and the patient's identity was confirmed by name and solicited date of birth prior to the procedure. Written informed consent was obtained from the patient, including but not limited to discussion of the risks of pain, bleeding, infection, recurrence, further treatment, and scarring. Photographs were obtained and the site was cleaned with an alcohol swab and infiltrated with 1% lidocaine with epinephrine.      Hemostasis was achieved with aluminum chloride. The wound was dressed with petrolatum and a bandage.  The specimen was submitted for histopathologic review.  The patient was instructed in wound care.  No complications were encountered.      Unique, patient-specific risk factor to consider regarding the procedure: none    The patient will be contacted with results of the biopsy once available. Further recommendations per pathology results.    # Actinic keratosis  Recommend treatment of lesions noted on exam today suspicious for actinic keratosis as they represent precancer and possible evolution to nonmelanoma skin cancer. Therapeutic options discussed; will proceed with empiric treatment as below. However, patient will contact this author if the lesions fail to resolve or worsen, changes unsatisfactorily, or symptoms arise. she verbalized understanding and is in agreement with this recommendation/plan.  -LN2 -f/t/f x 1 lesions at LV on 01/25    Procedure note (cryotherapy):  Location(s): central forehead x1, glabella x2    Number of lesions treated: 3  Lesions treated with liquid nitrogen today x  2  cycles after verbal consent was obtained from the patient including discussion of the risks including but not limited to scar, post-inflammatory pigment changes, infection, blister, pain, recurrence, and further treatment.  Wound care instructions discussed.     Unique, patient-specific risk factor to consider regarding the procedure: none      The anticipated therapeutic course was reviewed in detail  The patient understands to contact us  with any questions or concerns during the process    In reserve: repeat treatment or further investigation, such as biopsy    Counseled on appropriate sun avoidance by limiting outdoor activities between 10AM-4PM, applying sunscreen with SPF 30 or above (with reapplication if wiped off or per instructions on bottle), and using a physical blockade by protective clothing. The ABCDEs of melanoma was discussed.      # Melanocytic nevus  # Seborrheic keratosis  # Lentigo  # Diffuse photodamage (chronic, not at goal)  # Sebaceous hyperplasia  - Benign diagnosis, etiology, expected course/progression discussed with patient, who verbalized understanding.  - This author recommends monitoring/observation at this time. Patient agreed, but will return if changes noted or symptoms arise. she verbalized understanding and is in agreement with this recommendation/plan.  - Counseled on appropriate sun avoidance by limiting outdoor activities between 10AM-4PM, applying sunscreen with SPF 30 or above (with reapplication if wiped off or per instructions on bottle), and using a physical blockade by protective clothing. The ABCDEs of melanoma was discussed.  - Patient's questions/concerns addressed  - Start Rx: Tretinoin 0.025% topical cream next visit per patient preference for photodamage    RTC 1-3 months FBSE, sooner as needed    In the presence of Anselmo Bast, MD,  I have taken down these notes, Alisha, Scribe. 08/29/2023 2:48 PM              Anselmo Bast, MD MSc FAAD  Assistant Professor - Division of Dermatology  Department of Internal Medicine  [facility]

## 2023-08-30 ENCOUNTER — Encounter: Admit: 2023-08-30 | Discharge: 2023-08-30

## 2023-08-30 DIAGNOSIS — D485 Neoplasm of uncertain behavior of skin: Secondary | ICD-10-CM

## 2023-09-01 ENCOUNTER — Ambulatory Visit: Admit: 2023-09-01 | Discharge: 2023-09-02

## 2023-09-01 ENCOUNTER — Encounter: Admit: 2023-09-01 | Discharge: 2023-09-01

## 2023-09-01 MED ORDER — LEQVIO 284 MG/1.5 ML SC SYRG
284 mg | SUBCUTANEOUS | 1 refills | Status: AC
Start: 2023-09-01 — End: ?

## 2023-09-01 MED ORDER — INCLISIRAN 284 MG/1.5 ML SC SYRG
284 mg | Freq: Once | SUBCUTANEOUS | 0 refills | Status: CP
Start: 2023-09-01 — End: ?
  Administered 2023-09-01: 17:00:00 284 mg via SUBCUTANEOUS

## 2023-09-01 NOTE — Progress Notes
 Patient name and date of birth verified.     Injection administered in Right upper arm without complication. Pt tolerated injection well.     All questions answered. Patient verbalized understanding.

## 2023-09-05 ENCOUNTER — Encounter: Admit: 2023-09-05 | Discharge: 2023-09-05 | Payer: MEDICARE

## 2023-09-06 ENCOUNTER — Encounter: Admit: 2023-09-06 | Discharge: 2023-09-06 | Payer: MEDICARE

## 2023-09-07 ENCOUNTER — Encounter: Admit: 2023-09-07 | Discharge: 2023-09-07 | Payer: MEDICARE

## 2023-09-12 ENCOUNTER — Encounter: Admit: 2023-09-12 | Discharge: 2023-09-12 | Payer: MEDICARE

## 2023-09-13 ENCOUNTER — Encounter: Admit: 2023-09-13 | Discharge: 2023-09-13 | Payer: MEDICARE

## 2023-09-13 MED ORDER — AMLODIPINE 5 MG PO TAB
5 mg | ORAL_TABLET | Freq: Every day | ORAL | 3 refills | 90.00000 days | Status: AC
Start: 2023-09-13 — End: ?

## 2023-09-14 ENCOUNTER — Encounter: Admit: 2023-09-14 | Discharge: 2023-09-14 | Payer: MEDICARE

## 2023-09-14 ENCOUNTER — Ambulatory Visit: Admit: 2023-09-14 | Discharge: 2023-09-15 | Payer: MEDICARE

## 2023-09-14 DIAGNOSIS — C44319 Basal cell carcinoma of skin of other parts of face: Secondary | ICD-10-CM

## 2023-09-14 MED ORDER — LIDOCAINE-EPINEPHRINE 1 %-1:100,000 IJ SOLN
6 mL | Freq: Once | 0 refills | Status: CP
Start: 2023-09-14 — End: ?
  Administered 2023-09-14: 20:00:00 6 mL

## 2023-09-14 NOTE — Procedures
 Mohs Micrographic Surgery Operative Note    Patient Name: Cindy Randolph  Date of Birth: May 22, 1961    Procedure: Mohs micrographic surgery  Surgeon and Pathologist: Alverta Avers, MD    Referring MD: Anselmo Bast  Date of Service: 09/14/2023    A detailed discussion of the diagnosis, prognosis, and treatment options including Mohs micrographic surgery, wide local excision, radiation, destruction, and chemotherapy was performed. Based on my medical judgment, Mohs surgery is medically necessary because it is the most appropriate treatment for this cancer compared to other treatments. The rationale for Mohs surgery was explained to the patient and patient opted for Mohs surgery. The risks, benefits, and alternatives to treatment were discussed in detail. Specifically, the risks of pain, infection, swelling, bleeding, bruising, scarring, altered appearance, prolonged wound healing, incomplete removal, allergy to anesthesia, nerve injury, functional impairment, and recurrence discussed. Patient also informed that the skin cancer removal will result in a defect in the skin and soft tissue that may require repair such as a primary closure, flap, graft, or closure in the operating room in the future +/- hospitalization if needed. If the surgery reveals advanced disease, further resection in the operating room or adjuvant treatment with radiation and/or chemotherapy at a later date may be recommended.  All questions answered. Written and verbal informed consent obtained.    Prior to the procedure, a time-out was performed in which the patient's identity was verified and treatment site(s) were clearly identified and cleaned with alcohol prep pads, marked with surgical marker, and confirmed by the patient and surgeons.  All components of time-out protocol completed. Alverta Avers, MD operated in two distinct and integrated capacities as the surgeon and pathologist.          Lesion A  Mohs Case Number:  Site:  Pre Op Dx:  Post Op Dx:  Tumor Recurrence Status:  PreOp SIze:  PostOp Size:  Number of Stages:  Repair Type(s):  Final Wound Length (cm)/ Area of Flap or Graft (cm2):  Anesthesia: local infiltration  Skin Prep:   [id number]  Right cheek   Basal cell carcinoma (nodular)  Basal cell carcinoma (nodular)  Primary  0.8 x 1.2 cm  1.1 x 1.6 cm  1  Primary (complex) repair  3.5 cm  Lidocaine  1.0% soln w epinephrine     Chlorhexidine        Total Anesthesia for stage(s) and/or repair (mL): 6 cc     Estimated Blood loss:  Minimal  Complications:  None    Indications for Mohs Micrographic Surgery:    The patient has a biopsy- proven Primary Basal cell carcinoma (nodular)  located on the right cheek    Removal of the patient's tumor is complicated by the following clinical features:    Ill defined margins, Involvement of sensitive cosmetic/functional structure, and Limited skin laxity    Stage 1:      The patient was reclined on the surgery table. The site was prepped with antiseptic, infiltrated with local anesthesia, and draped. The surgical field was again prepped with antiseptic. All visible tumor was completely debulked using a curette and/or scalpel. An excision was made 2-25mm around the debulking defect following standard Mohs technique. Hemostasis was achieved with spot electrocautery. Pressure dressing was applied. Tissue was carefully divided into number specimen(s) indicated below, which was oriented, color coded using ink, and mapped. The specimen was then given to the technician for frozen sectioning, mounting, and staining using the Mohs protocol. Frozen section analysis showed residual tumor  in number of specimens as indicated below.   Number of Specimen(s):  Number of positive specimen(s): 1  0 Microscopic examination of tissue revealed margins clear of tumor.     Repair Note: Complex Repair    Repair Type:  complex repair  Dermal and subcutaneous suture:  4-0 monocryl  Epidermal suture:  5-0 prolene  Estimated blood loss:  < 5mL  Complications:  None.  Patient was discharged in stable condition.    Indications:  This patient was left with a skin and soft tissue defect following Mohs surgery.  A variety of closure modalities were discussed with the patient and it was decided that a complex repair would best preserve normal anatomical and functional relationships.  Complex repair was performed for the following indications: extensive undermining was required, the inelastic skin made closure difficult, redundant tissue cones were required to avoid a deformity, to avoid disruption of free anatomic margins, to reduce tension, to reduce risk of skin necrosis, infection, and dehiscence, and to optimize functional and cosmetic results.  After discussing the risks including pain, bleeding, bruising, swelling, infection, scarring, altered appearance, contour deformity, necrosis, wound dehiscence, functional deficit, nerve damage and need for revision, informed consent was obtained and the patient underwent the procedure as follows:    Procedure:  The patient was reclined on the surgery table.  The surgical defect and surrounding skin were cleansed with antiseptic, infiltrated with local anesthesia, re-cleansed with antiseptic, and then prepped with sterile drapes.  Extensive wide undermining was performed with #15 blade and/or iris scissors. The undermined distance was greater than the greatest width of the defect along at least one entire length of the repair.  Extensive undermining to twice the width of the defect was performed to avoid distortion of the margin.   Defect skin edges were de-beveled.  Redundant tissue cones were removed in order to facilitate reconstruction in the patient's natural skin tension lines and to obtain maximum functional and cosmetic results. Hemostasis was achieved with spot electrocautery.  The dermis and subcutaneous tissue were closed with buried vertical mattress sutures.  The epidermis was carefully approximated using simple running sutures.  White petrolatum  and pressure dressing applied.  Verbal and written wound care instructions were given.  RTC for suture removal and/or post-op check as directed. The patient was encouraged to follow up with the referring dermatologist.      Post-operative Plan:  Written and verbal wound care instructions reviewed  Advised long-term follow-up with referring dermatologist for skin cancer surveillance  Sun protection reviewed  Scar management reviewed    RTC as directed for suture removal and/or wound check

## 2023-09-16 ENCOUNTER — Encounter: Admit: 2023-09-16 | Discharge: 2023-09-16 | Payer: MEDICARE

## 2023-09-16 DIAGNOSIS — Z7189 Other specified counseling: Secondary | ICD-10-CM

## 2023-09-16 DIAGNOSIS — Z719 Counseling, unspecified: Secondary | ICD-10-CM

## 2023-09-16 NOTE — Progress Notes
 BP Readings from Last 3 Encounters:   09/14/23 112/83   09/13/23 (!) 118/92   08/20/23 (!) 147/109      Target BP: 130/80 (or 130/80 if not in notes--confirm with PCP)    What was your age (approximately) when you were first diagnosed with high blood pressure? NA  Do you think you had it for some time prior to that or were there new symptoms that took you to the doctor? (If yes then what symptoms?) NA  How often do you check your own blood pressure and how do you check it?  Has not been checking BP's  once or twice a month  If the last two, why do you not check it more often? N/A   Can you describe how to take your BP (positioning of cuff on arm, wrist, size of cuff, sitting, etc) She has a cuff at home  What blood pressure medications/dose are you taking? Amlodipine 5 mg in the am and Metoprolol 25 mg at HS  How long have you been taking the current medication(s) you take for BP? NA  Do you feel you have any side effects from these medications? NA  If yes, schedule appt with PCP, NP or pharmacy.  What pharmacy are you using? [facility] (For patient ease, encourage mail order pharmacy such as [place], [place], or insurance preferred mail order).   Do you usually get a 90 day supply of BP medications? yes  Are there cost issues with getting 90 days? Need to assess  How do you feel if you accidentally forget to take your blood pressure medication? no different or I've not noticed a difference   If you forget to take your meds in the AM what do you do? NA  If you sometimes forget, do you have a reminder set on your phone (if available)?  NA  Do or did your parents have high blood pressure?  Dad had MI in his 80's    If yes, did they take medications for it or have complications from high blood pressure such as a heart attack or stroke? NA  Which of these risks do you know of and/or have discussed with your doctor regarding not taking your blood pressure medications as indicated? (Heart attack, stroke, vision loss, heart failure, kidney disease or failure, erectile dysfunction) not yet assessed  Patient scheduled with PCP/NP every other month x 3 mo. No, Please explain: Cindy Randolph was on the road at the time of this call need to get her scheduled with 3 appointments and was agreeable to this and willing to try CCM    Education:Discussed symptoms she may encounter with having high BP, talked about how and when to take BP's, discussed starting a BP log to bring with her to doctor appointments      Future Appointments   Date Time Provider Department Center   09/21/2023  1:00 PM [facility] IM   09/26/2023 12:40 PM Donnalee Gab, Zilphia Hilt, MD MPGENMED IM   09/28/2023  9:20 AM Roylene Corn, MD [facility] ENT   10/10/2023  8:00 AM Armen Land, MD ASC[facility]CPP None   10/13/2023  1:15 PM Huggins, Melissa E, MD Q[facility]UR[facility] OB/GYN   10/13/2023  3:45 PM Armen Land, MD [facility] None   10/27/2023  1:00 PM April Knack, PhD [facility] Psychiatry   10/31/2023 10:00 AM Arlin Benes, APRN-NP [facility] [place] Exam   11/02/2023  8:45 AM Oswald Bless, Reche Canales, MD RVFDERM IM  11/03/2023  9:30 AM Jibbe, Kirk Peper, MD MPAPDERM IM   11/08/2023 10:40 AM Elroy Hallmark, MBBS MPAPULM IM   12/06/2023  2:00 PM Davene Ernst, APRN-NP CVMCLOP CVM Exam   01/13/2024 10:40 AM Emelie Hanger, MD [facility]   01/30/2024  9:40 AM Dearl Fabry, MD QVAGASTR IM   02/09/2024 10:00 AM Artis Binder, APRN-NP [facility] [place] [facility]   03/27/2024  3:30 PM Ansel Kingdom, MD CVMCLOP CVM Exam

## 2023-09-21 ENCOUNTER — Encounter: Admit: 2023-09-21 | Discharge: 2023-09-21 | Payer: MEDICARE

## 2023-09-21 ENCOUNTER — Ambulatory Visit: Admit: 2023-09-21 | Discharge: 2023-09-22 | Payer: MEDICARE

## 2023-09-21 DIAGNOSIS — I10 Essential (primary) hypertension: Secondary | ICD-10-CM

## 2023-09-21 NOTE — Progress Notes
 BP Readings from Last 3 Encounters:   09/14/23 112/83   09/13/23 (!) 118/92   08/20/23 (!) 147/109      Target BP: 130/80 (or 130/80 if not in notes--confirm with PCP)    What was your age (approximately) when you were first diagnosed with high blood pressure? 32 gestation HTN actual diagnosis in 2009  Do you think you had it for some time prior to that or were there new symptoms that took you to the doctor? (If yes then what symptoms?) With pregnancy   How often do you check your own blood pressure and how do you check it? at home with my own machine  2 or 3 times a week  If the last two, why do you not check it more often? other NA    Can you describe how to take your BP (positioning of cuff on arm, wrist, size of cuff, sitting, etc) With BP cuff at home  What blood pressure medications/dose are you taking? Amlodipine 5 mg and  Metoprolol 25 mg at HS  How long have you been taking the current medication(s) you take for BP? When first starting seeing Dr. Alexia Angelucci a year ago  Do you feel you have any side effects from these medications? no  If yes, schedule appt with PCP, NP or pharmacy.  What pharmacy are you using? [facility] (For patient ease, encourage mail order pharmacy such as [place], [place], or insurance preferred mail order).   Do you usually get a 90 day supply of BP medications? yes  Are there cost issues with getting 90 days? No  How do you feel if you accidentally forget to take your blood pressure medication? other Pressure in head not a headache, heart feels like it working harder    If you forget to take your meds in the AM what do you do? Takes the medication  If you sometimes forget, do you have a reminder set on your phone (if available)?  It's rare that she forgets to take her medicaitons  Do or did your parents have high blood pressure? Yes   If yes, did they take medications for it or have complications from high blood pressure such as a heart attack or stroke? Yes  Which of these risks do you know of and/or have discussed with your doctor regarding not taking your blood pressure medications as indicated? (Heart attack, stroke, vision loss, heart failure, kidney disease or failure, erectile dysfunction) heart attack, stroke, vision loss , heart failure , and kidney disease or failure   Patient scheduled with PCP/NP every other month x 3 mo. Yes 09/26/23, 11/01/23, 12/06/23     Future Appointments   Date Time Provider Department Center   09/21/2023  1:00 PM [facility] IM   09/26/2023 12:40 PM Donnalee Gab, Zilphia Hilt, MD MPGENMED IM   09/28/2023  9:20 AM Roylene Corn, MD [facility] ENT   10/10/2023  8:00 AM Armen Land, MD ASCICCPP None   10/13/2023  1:15 PM Huggins, Melissa E, MD QVUROBGY OB/GYN   10/13/2023  3:45 PM Armen Land, MD [facility] None   10/27/2023  1:00 PM April Knack, PhD [facility] Psychiatry   10/31/2023 10:00 AM Arlin Benes, APRN-NP CCC2 [place] Exam   11/01/2023  1:00 PM Donnalee Gab, Zilphia Hilt, MD [facility] IM   11/02/2023  8:45 AM Anselmo Bast, MD RVFDERM IM   11/03/2023  9:30 AM Lenoria Raid, MD MPAPDERM IM   11/08/2023  10:40 AM Elroy Hallmark, MBBS MPAPULM IM   12/06/2023  2:00 PM Trent Frizzle CVMCLOP CVM Exam   01/13/2024 10:40 AM Emelie Hanger, MD [facility]   01/30/2024  9:40 AM Dearl Fabry, MD QVAGASTR IM   02/09/2024 10:00 AM Artis Binder, APRN-NP [facility]   03/27/2024  3:30 PM Ansel Kingdom, MD CVMCLOP CVM Exam

## 2023-09-21 NOTE — Progress Notes
 General Risk Score 8  HCC Score 0.428  Program criteria met - HTN-CP list, High GRS score  Problem List (Diagnosis)HTN    Patient was identified using dual identification of name and date of birth with repeat back    Patient Reports:    1.CP flow chart completed, 3 appointments for CP scheduled 5/5, 6/10, 7/15. 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    Care Plan:    1.3 appointments scheduled --5/5, 6/10, 7/15  2.RPM referral --place 09/21/23  3.Support and provide any resources needed  4. Instructed patient to call care manager any time with questions or concerns.   5. Routing note to PCP    LOV:  05/23/23      Upcoming appointments include:    Future Appointments   Date Time Provider Department Center   09/21/2023  1:00 PM [facility] IM   09/26/2023 12:40 PM Donnalee Gab, Zilphia Hilt, MD [facility] IM   09/28/2023  9:20 AM Roylene Corn, MD [facility] ENT   10/10/2023  8:00 AM Armen Land, MD [facility] None   10/13/2023  1:15 PM Huggins, Melissa E, MD [facility]UR[facility] OB/GYN   10/13/2023  3:45 PM Armen Land, MD [facility] None   10/27/2023  1:00 PM April Knack, PhD [facility] Psychiatry   10/31/2023 10:00 AM Arlin Benes, APRN-NP CCC2 [place] Exam   11/01/2023  1:00 PM Donnalee Gab, Zilphia Hilt, MD [facility] IM   11/02/2023  8:45 AM Anselmo Bast, MD RVFDERM IM   11/03/2023  9:30 AM Lenoria Raid, MD MPAPDERM IM   11/08/2023 10:40 AM Elroy Hallmark, MBBS MPAPULM IM   12/06/2023  2:00 PM Davene Ernst, APRN-NP CVMCLOP CVM Exam 01/13/2024 10:40 AM Emelie Hanger, MD [facility]   01/30/2024  9:40 AM Dearl Fabry, MD Q[facility]STR IM   02/09/2024 10:00 AM Artis Binder, APRN-NP [facility] [place] Radiati   03/27/2024  3:30 PM Ansel Kingdom, MD CVMCLOP CVM Exam     Next review Suzzette Eth of plan (approximate):  10/21/23/PRN

## 2023-09-21 NOTE — Progress Notes
 POST-OP VISIT NOTE    CC: Suture removal and wound check     HPI: Cindy Randolph is a 63 y.o. female  s/p Mohs surgery with complex repair on the right cheek.  Patient concerns:  none    ROS: Patient denies any other skin complaints    EXAM:  General: NAD, well appearing, well nourished, Mood/affect are pleasant and appropriate  Skin: Examination of right cheek reveals well healing surgical site.  ?  A/P:   # Well-healing postoperative wound   - Continue gentle wound care, vaseline BID x 1 more week  - Natural course of wound healing and scar management reviewed. Informed patient it will take 2-3 months for scar to flatten.  - Patient informed to call the office if any issues were to arise in the future. Sunscreen/sun avoidance reinforced. Recommend photoprotection with at least SPF 30+ sunscreen.  - Patient instructed to continue to follow-up with primary dermatologist for routine skin screenings.    RTC PRN

## 2023-09-22 ENCOUNTER — Encounter: Admit: 2023-09-22 | Discharge: 2023-09-22 | Payer: MEDICARE

## 2023-09-23 ENCOUNTER — Encounter: Admit: 2023-09-23 | Discharge: 2023-09-23 | Payer: MEDICARE

## 2023-09-23 ENCOUNTER — Ambulatory Visit: Admit: 2023-09-23 | Discharge: 2023-09-24 | Payer: MEDICARE

## 2023-09-24 ENCOUNTER — Encounter: Admit: 2023-09-24 | Discharge: 2023-09-24 | Payer: MEDICARE

## 2023-09-26 ENCOUNTER — Encounter: Admit: 2023-09-26 | Discharge: 2023-09-26 | Payer: MEDICARE

## 2023-09-26 ENCOUNTER — Ambulatory Visit: Admit: 2023-09-26 | Discharge: 2023-09-27 | Payer: MEDICARE

## 2023-09-27 ENCOUNTER — Encounter: Admit: 2023-09-27 | Discharge: 2023-09-27 | Payer: MEDICARE

## 2023-09-28 ENCOUNTER — Encounter: Admit: 2023-09-28 | Discharge: 2023-09-28 | Payer: MEDICARE

## 2023-09-28 ENCOUNTER — Ambulatory Visit: Admit: 2023-09-28 | Discharge: 2023-09-29 | Payer: MEDICARE

## 2023-09-29 ENCOUNTER — Encounter: Admit: 2023-09-29 | Discharge: 2023-09-29 | Payer: MEDICARE

## 2023-10-02 ENCOUNTER — Encounter: Admit: 2023-10-02 | Discharge: 2023-10-02 | Payer: MEDICARE

## 2023-10-02 MED FILL — RXAMB NALTREXONE 4.5 MG CAPSULE (COMPOUND): ORAL | 90 days supply | Qty: 0.41 | Fill #1 | Status: CP

## 2023-10-03 ENCOUNTER — Encounter: Admit: 2023-10-03 | Discharge: 2023-10-03 | Payer: MEDICARE

## 2023-10-04 ENCOUNTER — Encounter: Admit: 2023-10-04 | Discharge: 2023-10-04 | Payer: MEDICARE

## 2023-10-05 ENCOUNTER — Encounter: Admit: 2023-10-05 | Discharge: 2023-10-05 | Payer: MEDICARE

## 2023-10-05 DIAGNOSIS — I10 Essential (primary) hypertension: Secondary | ICD-10-CM

## 2023-10-05 NOTE — Telephone Encounter
 Remote Patient Monitoring Enrollment      Patient has been identified as a candidate for the Remote Patient Monitoring program. They will be provided with a equipment to monitor their blood pressure.      Does patient consent? Yes      Verified insurance? Yes    Changes to coverage anticipated in next 90 days? No    Verbal Consent obtained including acknowledgement of copayment responsibility.

## 2023-10-12 ENCOUNTER — Encounter: Admit: 2023-10-12 | Discharge: 2023-10-12 | Payer: MEDICARE

## 2023-10-12 DIAGNOSIS — I10 Essential (primary) hypertension: Secondary | ICD-10-CM

## 2023-10-12 NOTE — Progress Notes
 Remote Patient Monitoring Call Attempt      Attempted call to patient for follow up of Remote Patient Monitoring program, left voice mail.    Attempted welcome call. Left voice message and call back number.    Remote Patient Monitoring          10/08/2023 10/09/2023 10/10/2023 10/11/2023 10/12/2023   BP   Systolic BP 113 mmHg  124 mmHg  134 mmHg  127 mmHg 147 mmHg   Systolic BP Pt Entered   154 mmHg 117 mmHg     Diastolic BP 72 mmHg  83 mmHg  85 mmHg  84 mmHg 99 mmHg   Diastolic BP Pt Entered   92 mmHg 76 mmHg     Heart Rate   Heart Rate  74 bpm  74 bpm  81 bpm  80 bpm  74 bpm   Heart Rate Pt entered   75 bpm 82 bpm     SPO2   SPO2 95 %  96 %  96 %  94 % 95 %   SPO2 Pt Entered  98 %      Weight   Weight 162 Lb 158.8 Lb 158.4 Lb 158 Lb 157 Lb      Details          More abnormal values are hidden. Newest values shown. Go to activity for more data.    More values are hidden. Newest values shown. Go to activity for more data.                Home Monitoring Minutes (this encounter): 2

## 2023-10-13 ENCOUNTER — Ambulatory Visit: Admit: 2023-10-13 | Discharge: 2023-10-14 | Payer: MEDICARE

## 2023-10-13 ENCOUNTER — Encounter: Admit: 2023-10-13 | Discharge: 2023-10-13 | Payer: MEDICARE

## 2023-10-14 ENCOUNTER — Encounter: Admit: 2023-10-14 | Discharge: 2023-10-14 | Payer: MEDICARE

## 2023-10-18 ENCOUNTER — Encounter: Admit: 2023-10-18 | Discharge: 2023-10-18 | Payer: MEDICARE

## 2023-10-18 MED ORDER — METOPROLOL SUCCINATE 50 MG PO TB24
50 mg | ORAL_TABLET | Freq: Every evening | ORAL | 3 refills | 90.00000 days | Status: AC
Start: 2023-10-18 — End: ?

## 2023-10-18 MED ORDER — METOPROLOL SUCCINATE 50 MG PO TB24
50 mg | ORAL_TABLET | Freq: Every evening | ORAL | 3 refills | 90.00000 days | Status: DC
Start: 2023-10-18 — End: 2023-10-18

## 2023-10-19 ENCOUNTER — Encounter: Admit: 2023-10-19 | Discharge: 2023-10-19 | Payer: MEDICARE

## 2023-10-19 DIAGNOSIS — N760 Acute vaginitis: Secondary | ICD-10-CM

## 2023-10-21 ENCOUNTER — Encounter: Admit: 2023-10-21 | Discharge: 2023-10-21 | Payer: MEDICARE

## 2023-10-21 DIAGNOSIS — I10 Essential (primary) hypertension: Secondary | ICD-10-CM

## 2023-10-21 NOTE — Progress Notes
 RPM Monthly Monitoring Minutes    Patient submitted RPM data 13 days this month accounting for 13 minutes of monitoring. This time is in addition to time charted in individual encounters.    Home Monitoring Minutes (this encounter): 13

## 2023-10-27 ENCOUNTER — Ambulatory Visit: Admit: 2023-10-27 | Discharge: 2023-10-28 | Payer: MEDICARE

## 2023-10-27 ENCOUNTER — Encounter: Admit: 2023-10-27 | Discharge: 2023-10-27 | Payer: MEDICARE

## 2023-10-27 DIAGNOSIS — F330 Major depressive disorder, recurrent, mild: Secondary | ICD-10-CM

## 2023-10-28 ENCOUNTER — Encounter: Admit: 2023-10-28 | Discharge: 2023-10-28 | Payer: MEDICARE

## 2023-10-31 ENCOUNTER — Encounter: Admit: 2023-10-31 | Discharge: 2023-10-31 | Payer: MEDICARE

## 2023-10-31 DIAGNOSIS — I10 Essential (primary) hypertension: Secondary | ICD-10-CM

## 2023-10-31 NOTE — Progress Notes
 Remote Patient Monitoring Data Review      RPM data reviewed. No alerts indicating vital signs out of protocol ranges. No need to call patient at this time.    On 5/27, PCP increased patient's metoprolol dose to 50mg  HS. Her blood pressure average over the last 7 days through RPM is 133/91, HR 72.    Patient has follow up with PCP tomorrow, 6/10.     Remote Patient Monitoring          10/26/2023 10/27/2023 10/28/2023 10/29/2023 10/30/2023 10/31/2023   BP   Systolic BP  141 mmHg  145 mmHg  161 mmHg 127 mmHg  106 mmHg 111 mmHg   Diastolic BP  86 mmHg  88 mmHg  97 mmHg 98 mmHg  81 mmHg 87 mmHg   Heart Rate   Heart Rate  74 bpm  80 bpm  68 bpm  70 bpm  80 bpm 79 bpm   Heart Rate Pt entered     79 bpm    SPO2   SPO2  96 %  98 %  94 % 95 % 93 % 95 %   SPO2 Pt Entered     98 %    Weight   Weight 159.8 Lb 160.4 Lb 159.4 Lb 158.8 Lb  157.8 Lb   Weight Pt Entered     159.4 Lb       Details          More abnormal values are hidden. Newest values shown. Go to activity for more data.    More values are hidden. Newest values shown. Go to activity for more data.

## 2023-11-01 ENCOUNTER — Encounter: Admit: 2023-11-01 | Discharge: 2023-11-01 | Payer: MEDICARE

## 2023-11-01 ENCOUNTER — Ambulatory Visit: Admit: 2023-11-01 | Discharge: 2023-11-02 | Payer: MEDICARE

## 2023-11-01 DIAGNOSIS — I10 Essential (primary) hypertension: Secondary | ICD-10-CM

## 2023-11-01 DIAGNOSIS — M2550 Pain in unspecified joint: Secondary | ICD-10-CM

## 2023-11-01 MED ORDER — BUSPIRONE 5 MG PO TAB
5 mg | ORAL_TABLET | Freq: Two times a day (BID) | ORAL | 0 refills | 30.00000 days | Status: AC
Start: 2023-11-01 — End: ?

## 2023-11-01 NOTE — Progress Notes
 11/01/2023     Subjective:       Patient Reported Other  What topic(s) would you like to cover during your appointment?:  Update  Dr. Rosalee Collins opinions on:   Ambien fail (nightmares, next-day fog)  BP meds (I know I have a cardiologist, but I respect Internist holistic view)  Pain (had to stop LDN 4.5)  Please describe the issue(s) and history with the issue (location, severity, duration, symptoms, etc.).:  Ambien as above. Yuck. An FP friend suggested low dose of clonazepam. Very helpful for him and his wife in their 60s.  Trying Metoprolol 50 mg pm; I was surprised before increase that my BPs were high in the morning.  Pain: after increase of LDN to 3mg  I noticed anhedonia. It got even worse with 4.5mg . So I stopped LDN and general arthritis got significantly worse. Anhedonia went away.   I got some clear capsules and am breaking up my LDN dose in half to see if pain lessens. If it does, I?ll ask in a few months for a dose of 2 or 2.5mg .  What has been done so far to take care of the issue(s)?:  See above  What are your goals for this visit?:  Update of above. BP check    Cindy Randolph is a 63 y.o. female.  Follow Up     Has history of left breast cancer s/p mastectomy in 2022, cervical spine radiculopathy, lumbar degenerative disease, osteoporosis, migraine, depression, OSA on CPAP and GERD with hiatal hernia.     The patient presents for a routine follow-up regarding hypertension management.    She is currently taking 50 mg of metoprolol at night and 5 mg of amlodipine in the morning. A missed dose of amlodipine recently led to elevated blood pressure readings. Her average blood pressure is 133/91 mmHg, with a recent morning reading of 142/98 mmHg.    She uses a CPAP machine for sleep apnea, which causes nasal discomfort, dryness, and morning headaches despite using humidity. She has difficulty sleeping, often waking up during the night due to various factors including nocturia and disturbances from pets. Previous trials of Ambien and trazodone for sleep were ineffective.    She takes low-dose naltrexone for pain management, adjusting the dose to approximately 2 mg due to anhedonia at higher doses. Anxiety is better managed now, with past use of duloxetine causing nightmares. She is ok considering Buspar for anxiety management.             Objective:          acetaminophen (TYLENOL) 325 mg tablet Take two tablets by mouth every 6 hours. Take scheduled for 3 days after surgery, then as needed. Do not exceed 4,000mg  in a 24 hour period.    amLODIPine (NORVASC) 5 mg tablet Take one tablet by mouth daily.    aspirin 81 mg chewable tablet Chew one tablet by mouth daily.    biotin 1 mg cap Take one capsule by mouth daily.    busPIRone (BUSPAR) 5 mg tablet Take one tablet by mouth twice daily.    calcium carbonate (OS-CAL) 1250 mg (500 mg elemental calcium) tablet Take one tablet by mouth daily.    CHOLEcalciferoL (vitamin D3) 5000 unit tablet Take one tablet by mouth daily.    clobetasoL (TEMOVATE) 0.05 % topical ointment Apply  topically to affected area twice daily. Apply to areas of rash on hands twice daily; do NOT use on face/groin/underarms, use up to 2 weeks per month  diclofenac sodium (VOLTAREN) 1 % topical gel Apply four g topically to affected area daily as needed.    ezetimibe (ZETIA) 10 mg tablet Take one tablet by mouth daily.    ferrous sulfate (SLOW RELEASE IRON) 142 mg (45 mg iron) ER tablet Take one tablet by mouth daily.    Fish,Bora,Flax Oils-OM3,6,9 #1 (TRIPLE OMEGA) 400-400-400 mg capsule Take one capsule by mouth at bedtime daily.    inclisiran (LEQVIO) 284 mg/1.5 mL injection syringe Inject 1.5 mL under the skin every 180 days. Initially then at 3 months and then every 6 months    metoprolol succinate XL (TOPROL XL) 50 mg extended release tablet Take one tablet by mouth at bedtime daily.    Miscellaneous Medical Supply misc DME requirements, rx to include the following typed information:    NPI: [id number]  Doctor's typed name: Oswaldo Blessing, MD  ICD 10: G47.33  Length of need: 99 months  Start date:02/14/2023  Signature date: 02/14/2023  Detailed equipment: CPAP machine with any accessories.    naltrexone 4.5 MG oral capsule (COMPOUND) Take one capsule by mouth daily.    other medication Take one Dose by mouth daily. K Complete K1 & K2 as MK-4 & MK-7 : Take 1 softgel by mouth once daily    Selenium 100 mcg tab Take one tablet by mouth daily.    SUMAtriptan succinate (IMITREX) 25 mg tablet Take one tablet by mouth at onset of headache. May repeat after 2 hours if needed. Max of 200 mg in 24 hours.    SYNTHROID 100 mcg tablet Take one tablet by mouth daily 30 minutes before breakfast.    tiZANidine (ZANAFLEX) 2 mg tablet Take one tablet by mouth twice daily as needed.    vit A/vit C/vit E/zinc/copper (PRESERVISION AREDS PO) Take 1 tablet by mouth daily.    vitamins, B complex tab Take one tablet by mouth daily.    vitamins, multiple cap Take one capsule by mouth daily.     Vitals:    11/01/23 1314   BP: 116/89   Pulse: 66   Temp: 36.9 ?C (98.4 ?F)   Resp: 16   SpO2: 99%   PainSc: Five   Weight: 72.6 kg (160 lb)   Height: 157.5 cm (5' 2)     Body mass index is 29.26 kg/m?Aaron Aas     Physical Exam  Vitals reviewed.   HENT:      Head: Normocephalic and atraumatic.   Pulmonary:      Effort: Pulmonary effort is normal.   Skin:     General: Skin is warm and dry.   Neurological:      Mental Status: She is alert and oriented to person, place, and time.   Psychiatric:         Mood and Affect: Mood normal.         Behavior: Behavior normal.         BP Readings from Last 5 Encounters:   11/01/23 116/89   10/31/23 (!) 122/92   10/13/23 129/82   09/28/23 123/87   09/26/23 114/75        Wt Readings from Last 5 Encounters:   11/01/23 72.6 kg (160 lb)   10/31/23 72.3 kg (159 lb 6.4 oz)   10/13/23 72.1 kg (159 lb)   09/28/23 72.9 kg (160 lb 12.8 oz)   09/26/23 71.7 kg (158 lb 1.6 oz) Health Maintenance   Topic Date Due    RSV VACCINE (60 years and Older/Pregnant Women) (  1 - Risk 60-74 years 1-dose series) Never done    COVID-19 VACCINE (3 - 2024-25 season) 01/23/2023    MEDICARE ANNUAL WELLNESS VISIT  02/10/2024    DTAP/TDAP VACCINES (3 - Td or Tdap) 11/21/2025    CERVICAL CANCER SCREENING  10/12/2028    COLORECTAL CANCER SCREENING  10/15/2031    SHINGLES RECOMBINANT VACCINE  Completed    HIV SCREENING  Completed    PNEUMOCOCCAL VACCINE AGE 30 AND OVER  Completed    HEPATITIS C SCREENING  Completed    DEPRESSION SCREENING  Completed    INFLUENZA VACCINE  Completed    HPV VACCINES  Aged Out    MENINGOCOCCAL B VACCINE  Aged Out    BREAST CANCER SCREENING  Discontinued               Assessment and Plan:    Problem   Anxiety   Chronic Pain of Multiple Joints    X-ray right hand on 11/11/2022 showed 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 Radial deviation of the second digit at the DIP joint.      Primary Hypertension    Hypertension with pregnancy initially but sustained after 8th pregnancy.          Problem List Items Addressed This Visit          CARDIAC AND VASCULATURE    Primary hypertension - Primary    Blood pressure fluctuations with recent readings of 142/98 mmHg and average 133/91 mmHg.   Follows with cardiology  - Continue Toprol XL 50 mg and amlodipine 5 mg  - Participating in blood pressure monitoring program            MENTAL HEALTH    Anxiety    Buspar suggested for anxiety and potential sleep benefits. Was on lexapro in the past. Had nightmares with duloxetine.  - Started Buspar 5 mg twice a day.  - Monitor anxiety levels and adjust dosage as needed.         Relevant Medications    busPIRone (BUSPAR) 5 mg tablet       MUSCULOSKELETAL AND INJURIES    Chronic pain of multiple joints    Had side effects affecting sleep with gabapentin and lyrica.  Low-dose naltrexone effective for pain management. Reduced to 2.5 mg daily.  - Continue low-dose naltrexone at approximately 2.5 mg daily             There are no Patient Instructions on file for this visit.     Return in about 3 months (around 02/01/2024) for [CODE 4017] - Medicare AWV.

## 2023-11-01 NOTE — Assessment & Plan Note
 Had side effects affecting sleep with gabapentin and lyrica.  Low-dose naltrexone effective for pain management. Reduced to 2.5 mg daily.  - Continue low-dose naltrexone at approximately 2.5 mg daily

## 2023-11-01 NOTE — Assessment & Plan Note
 Buspar suggested for anxiety and potential sleep benefits. Was on lexapro in the past. Had nightmares with duloxetine.  - Started Buspar 5 mg twice a day.  - Monitor anxiety levels and adjust dosage as needed.

## 2023-11-01 NOTE — Assessment & Plan Note
 Blood pressure fluctuations with recent readings of 142/98 mmHg and average 133/91 mmHg.   Follows with cardiology  - Continue Toprol XL 50 mg and amlodipine 5 mg  - Participating in blood pressure monitoring program

## 2023-11-02 ENCOUNTER — Encounter: Admit: 2023-11-02 | Discharge: 2023-11-02 | Payer: MEDICARE

## 2023-11-02 ENCOUNTER — Ambulatory Visit: Admit: 2023-11-02 | Discharge: 2023-11-03 | Payer: MEDICARE

## 2023-11-02 DIAGNOSIS — F419 Anxiety disorder, unspecified: Secondary | ICD-10-CM

## 2023-11-02 DIAGNOSIS — Z08 Encounter for follow-up examination after completed treatment for malignant neoplasm: Secondary | ICD-10-CM

## 2023-11-02 DIAGNOSIS — L820 Inflamed seborrheic keratosis: Secondary | ICD-10-CM

## 2023-11-02 DIAGNOSIS — L814 Other melanin hyperpigmentation: Secondary | ICD-10-CM

## 2023-11-02 DIAGNOSIS — L821 Other seborrheic keratosis: Secondary | ICD-10-CM

## 2023-11-02 DIAGNOSIS — L918 Other hypertrophic disorders of the skin: Secondary | ICD-10-CM

## 2023-11-02 DIAGNOSIS — R208 Other disturbances of skin sensation: Secondary | ICD-10-CM

## 2023-11-02 DIAGNOSIS — D229 Melanocytic nevi, unspecified: Secondary | ICD-10-CM

## 2023-11-02 DIAGNOSIS — L578 Other skin changes due to chronic exposure to nonionizing radiation: Secondary | ICD-10-CM

## 2023-11-02 MED ORDER — TRETINOIN 0.025 % TP CREA
Freq: Every evening | TOPICAL | 11 refills | 30.00000 days | Status: AC
Start: 2023-11-02 — End: ?

## 2023-11-02 NOTE — Patient Instructions
 Thank you for choosing [facility]. I am happy that you are allowing me to participate in your care.     A summary of the recommendations made at today's visit is below:  CRYOTHERAPY INSTRUCTIONS    What should I expect after cryosurgery?  2 -- 24 hours:  Redness and swelling occurs almost immediately; a firm blister may form that may contain blood or clear fluid.    1 -- 2 days:  The blister (if present) may break and a clear to yellowish drainage and/or crust may develop.  Some bleeding may occur.  The site should not be noticeably painful.  3 -- 10 days:  Crusting and drainage will continue, but the swelling will decrease.  The redness will begin resolving.  10 days - 3 weeks:  Healing will continue, with residual redness.  3 - 6 weeks:  Complete healing.    How do I care for the treated area?  Wash the treated areas daily. Allow soapy water to run over the areas, but do not scrub.  Should a scab or crust form, allow it to fall off on its own - do not remove or ?pick? at it.  The crust is protective to the new, healthy tissue growing underneath.    Application of ointment and a bandage may make you feel more comfortable, but is not necessary. If you would like to use an ointment to the cryotherapy site, we recommend Vaseline (petroleum jelly) or Aquaphor/Eucerin healing ointment     Will there be pain?  Pain is typically minimal. If you experience pain following the procedure, and your other medical conditions allow, Tylenol may be used.      If pain is severe, contact your physician.     Long term cryotherapy care  The cryotherapy site will be more sun sensitive than your surrounding skin.  Keep it covered, and remember to apply sun screen every day to all your sun exposed skin.  A scar may remain which is lighter or pinker than your normal skin. Your body will continue to improve your scar for up to one year, however a light colored scar may remain.  Infection following cryotherapy is rare. However, if you are worried by the appearance the treated area, contact your doctor.     General recommendations  Applying creams/ointments  If you were prescribed a topical medication, a small amount will suffice. A line of cream/ointment placed on your fingertip to the first joint (where the fingertip bends) should be sufficient to cover a surface area of the size of the palm of your hand. You do not need a thick layer the prescription cream/ointment.     Your rash may return after application of the prescription topical medicine. Repeat the course of treatment as discussed in your visit with me. Please let me know if you don't get enough relief, the rash spreads, or if new or worsening symptoms develop.    Gentle skin care for everyday  - Bathe with lukewarm water, 5-10 minutes. Dry thoroughly afterward by patting yourself with a towel. Avoid heavy rubbing  - Use a mild soap, such as Dove, Cetaphil, or any fragrance-free product  - Apply a moisturizer of your choice as much as you can (there's no limit like in prescription topical medications), but at least 2 times a day (even if no bath is taken) and after every bath.  After the bath, pat dry and apply the moisturizer right away. Use a thick moisturizer that says cream  or ointment (not lotion that comes in a pump bottle - this is not as moisturizing, but does work). The stickier the better (like Vaseline or Aquaphor)! However, find one that you like and will use.  - For your face, you may use a gentle cleanser (such as CeraVe or Cetaphil just as examples) or plain water, followed by a non-comedogenic (meaning it won't clog pores) moisturizer with sunscreen SPF 30 or higher. A mineral sunscreen with zinc, titanium, or iron oxide are preferable      Sun protection is KEY to success in taking care of your skin!  Using sunscreens and sun avoidance will decrease your risk of developing skin cancers like melanoma, as well as prevent developing signs of aging like wrinkles, redness, and brown/dark spots.  - Avoid the sun at peak hours of the day (10 AM - 4 PM)   - Wear sunscreen SPF 30 or above. It should be reapplied every 2 hours if possible, or when you wipe off with a towel after swimming  - Use sunprotective clothing (shirts with long sleeves, pants, and wide-brimmed cowboy style hat). Baseball caps don't work well enough!    ** FOR MOISTURIZERS AND SUNSCREENS: I RECOMMEND ANY PRODUCT THAT YOU LIKE AND WILL USE CONSISTENTLY -- ANY BRAND/PRICE IS ACCEPTABLE **    Moles and other brown spots   - Common melanocytic nevi, or moles, tend to be less than 6 mm in diameter (size of a pencil eraser) and symmetric with even pigmentation, round or oval shape, regular outline, and sharp, non-fuzzy border. They can stick out from the skin.  - Flaky, seemingly stuck-on brown bumps are usually benign/non-cancerous keratoses, not moles. You will likely get more of these with time.  - Bright red smooth bumps that do not bleed are usually benign blood vessel lesions (cherry angiomas), not moles.  - It is normal to get new moles until the age of 40-98 years old.    Please contact me if you notice the following:  - A mole that differs from others (an ugly duckling that has an odd shape, uneven or uncertain border, different colors), or one that changes, bleeds, or itches.  - Any growth that has one or more of the following features:  A - Asymmetry. (Concerning if spot is not symmetric--one half is unlike the other half)  B - Border. (Irregular, notched, or poorly defined border are concerning)  C - Color. (Multiple colors [shades of tan, brown or black, or is sometimes white, red, or blue] or changes in color are concerning)  D - Diameter. (Larger than 6mm, i.e., a pencil eraser)  E - Evolution. (An evolving or changing spot, which looks different from others on your body. If there's a new itch, tenderness, or bleeding develop, these are concerning changes too.  - An uneven, new, or large (greater than 3mm) brown or black streak underneath a fingernail or toenail, which may or may not spread onto the skin next to the nail  - A sore that just won't heal after about 4 weeks    Cost of prescriptions  We do everything that we can to get your prescriptions approved through your insurance company. Unfortunately, our efforts may not always work. I understand this is frustrating. Some options to help make your prescription medications more affordable include GoodRx (https://www.goodrx.com/) and [facility] (https://costplusdrugs.com/medications/categories/hair-&-skin-health/). Please let me know if your prescription is too expensive at your regular pharmacy and we can try one of those  options. Even if it is covered by your insurance, your pharmacy may not communicate with [place].          Contact info  Routine - Please don't hesitate to let me know if you have additional questions or concerns before your next visit. I recommend directing your inquiries through MyChart. However, if you do not have MyChart access or prefer calling, the phone number is [phone], Option 4.   Urgent - For urgent, after-hours/weekend issues that are not emergencies, please call the [facility] Operator at [phone]. Ask to have paged the dermatologist on call. You will receive a call back from the dermatologist.  Emergency - Please call 911 or go to the nearest Emergency Department for all medical emergencies.         Zada Finders, MD MSc FAAD  Assistant Professor - Dermatology  Department of Internal Medicine  [facility]

## 2023-11-02 NOTE — Progress Notes
 Date of Service: 11/02/2023     Dermatology Problem List:   # Dyshidrotic Eczema  - Clobetasol 0.5% ointment    # Photodamage  - tretinoin 0.025% cream    # AK   - LN2    # Personal history of skin cancer: Yes  NMSC  Cancer type Location Date of bx Accession # Procedure type/date   [facility] Left cheek 06/01/2023 [id number]  MMS Dr. Ruven Coy 07/20/2023   [facility] Right Cheek 08/29/23  MMS Dr. Ruven Coy 09/14/23     # FH NMSC - mother    # History of Breast Cancer S/p Radiation and Chemotherapy     Subjective:             Cindy Randolph is a 63 y.o. female.    History of Present Illness  Return  PATIENT. Last seen 08/2023 with Dr. Oswald Bless when the following was addressed:     # Actinic keratosis  -LN2 -f/t/f x 3 lesions at LV on 04/25    # History of basal cell carcinoma  - most recently right cheek, s/p mohs with Dr. Ruven Coy on 09/14/23    # Melanocytic nevus  # Seborrheic keratosis  # Lentigo  # Diffuse photodamage (chronic, not at goal)  # Sebaceous hyperplasia  - Benign diagnosis, etiology, expected course/progression discussed with patient, who verbalized understanding.    She reports pulling of the skin with eye movement on left s/p MMS (tolerated well). Will ask plastic surgeon for second opinion re: options.    Today, she presents with the following concerns:     Itching, with bump  Location: left shoulder  Duration: several weeks ago  Associated local and/or systemic symptoms: itching, reports possible sunburn or reaction to sunscreen from recent trip   Palliative factors: none  Provocative factors: none  Prior evaluation: none  Previous treatment: none  Current treatment: none       Review of Systems   Constitutional:  Negative for appetite change, diaphoresis, fatigue, fever and unexpected weight change.   HENT:  Negative for congestion, mouth sores and sore throat.    Eyes:  Negative for pain, redness, itching and visual disturbance.   Respiratory:  Negative for cough and shortness of breath.    Cardiovascular:  Negative for palpitations and leg swelling.   Gastrointestinal:  Negative for abdominal pain, blood in stool, diarrhea, nausea and vomiting.   Genitourinary:  Negative for difficulty urinating and hematuria.   Musculoskeletal:  Negative for arthralgias and myalgias.   Neurological:  Negative for dizziness, seizures and facial asymmetry.   Hematological:  Does not bruise/bleed easily.   Psychiatric/Behavioral:  Negative for confusion and dysphoric mood. The patient is not nervous/anxious.      Objective:          acetaminophen (TYLENOL) 325 mg tablet Take two tablets by mouth every 6 hours. Take scheduled for 3 days after surgery, then as needed. Do not exceed 4,000mg  in a 24 hour period.    amLODIPine (NORVASC) 5 mg tablet Take one tablet by mouth daily.    aspirin 81 mg chewable tablet Chew one tablet by mouth daily.    biotin 1 mg cap Take one capsule by mouth daily.    busPIRone (BUSPAR) 5 mg tablet Take one tablet by mouth twice daily.    calcium carbonate (OS-CAL) 1250 mg (500 mg elemental calcium) tablet Take one tablet by mouth daily.    CHOLEcalciferoL (vitamin D3) 5000 unit tablet Take one tablet by mouth daily.  clobetasoL (TEMOVATE) 0.05 % topical ointment Apply  topically to affected area twice daily. Apply to areas of rash on hands twice daily; do NOT use on face/groin/underarms, use up to 2 weeks per month    diclofenac sodium (VOLTAREN) 1 % topical gel Apply four g topically to affected area daily as needed.    ezetimibe (ZETIA) 10 mg tablet Take one tablet by mouth daily.    ferrous sulfate (SLOW RELEASE IRON) 142 mg (45 mg iron) ER tablet Take one tablet by mouth daily.    Fish,Bora,Flax Oils-OM3,6,9 #1 (TRIPLE OMEGA) 400-400-400 mg capsule Take one capsule by mouth at bedtime daily.    inclisiran (LEQVIO) 284 mg/1.5 mL injection syringe Inject 1.5 mL under the skin every 180 days. Initially then at 3 months and then every 6 months    metoprolol succinate XL (TOPROL XL) 50 mg extended release tablet Take one tablet by mouth at bedtime daily.    Miscellaneous [employer] misc DME requirements, rx to include the following typed information:    NPI: [id number]  Doctor's typed name: Oswaldo Blessing, MD  ICD 10: G47.33  Length of need: 99 months  Start date:02/14/2023  Signature date: 02/14/2023  Detailed equipment: CPAP machine with any accessories.    naltrexone 4.5 MG oral capsule (COMPOUND) Take one capsule by mouth daily.    other medication Take one Dose by mouth daily. K Complete K1 & K2 as MK-4 & MK-7 : Take 1 softgel by mouth once daily    Selenium 100 mcg tab Take one tablet by mouth daily.    SUMAtriptan succinate (IMITREX) 25 mg tablet Take one tablet by mouth at onset of headache. May repeat after 2 hours if needed. Max of 200 mg in 24 hours.    SYNTHROID 100 mcg tablet Take one tablet by mouth daily 30 minutes before breakfast.    tiZANidine (ZANAFLEX) 2 mg tablet Take one tablet by mouth twice daily as needed.    vit A/vit C/vit E/zinc/copper (PRESERVISION AREDS PO) Take 1 tablet by mouth daily.    vitamins, B complex tab Take one tablet by mouth daily.    vitamins, multiple cap Take one capsule by mouth daily.     Vitals:    11/02/23 0839   PainSc: Six   Weight: 72.1 kg (159 lb)   Height: 158.8 cm (5' 2.5)       Body mass index is 28.62 kg/m?Cindy Randolph     Physical Exam    Areas Examined (all normal unless noted below):  Head/Face    Pertinent findings include:    - left shoulder, tan/brown, waxy, stuck-on papule with comedo like openings, milia like cysts, and fingerprint/network like structures  - right cheek, with geometric, white, firm, flat plaque(s) without repigmentation or nodularity  - right upper back, with fleshy pedunculated papule  - left cheek/nasal sidewall, with geometric, white, firm, hypertrophic plaque(s) without repigmentation or nodularity  - Head/neck with brown, symmetric macules and papules with regular pigment network on dermoscopy  - Head/neck with tan and brown, waxy, stuck-on, thin papules  - Head/neck with photo-distributed tan feathery macules         Assessment and Plan:    # Acrochordon, inflamed  # Disturbance of skin sensation  - This benign diagnosis was reviewed with the patient and reassurance was provided.    - Due to the symptomatic nature of the lesion (s), we will treat empirically with cryotherapy. Further investigation, such as biopsy, is in reserve if the  condition fails to resolve. However, I advised that if her condition worsens, does not improve, associated symptoms arise, or otherwise changes in any way, she should contact us  at her earliest convenience and by her method of choice before follow up as defined in today's visit. she verbalized understanding and is in agreement with this recommendation/plan.    Procedure: Cryotherapy, inflamed acrochordon  Location(s): right upper back x1  Number of lesions treated: 1  Lesions treated with liquid nitrogen today x 2-3 cycles after verbal consent was obtained from the patient including discussion of the risks including but not limited to scar, post-inflammatory pigment changes, infection, blister, pain, recurrence, and further treatment.  Wound care instructions discussed.    This Bolivar Bushman believes there is a medical necessity for treatment because the lesion has the following characteristics:  Bleeding no   Pain  no   Persistent or intense itching yes   Physical evidence of inflammation (i.e. erythema) yes   Lesion is in an atomic area subject to recurrent physical trauma, and there is documentation that such trauma occurred yes     # Inflamed/Irritated Seborrheic Keratosis   - Reassured that lesion(s) appear clinically benign and treatment is optional  - Liquid nitrogen applied for 2 freeze-to-thaw cycles   - Crusting and even blistering is expected, followed by resolution of the lesion(s)  -  Apply white petrolatum to speed healing     DESTRUCTION OF BENIGN LESIONS   Verbal consent obtained.  TYPE OF DESTRUCTION: Cryotherapy  DIAGNOSIS: Inflamed seborrheic keratosis  LOCATION OF LESIONS: left shoulder x1  NUMBER OF LESIONS TREATED: 1  Wound care instructions provided. Patient tolerated the procedure well.    # Actinic keratosis  Recommend treatment of lesions noted on exam today suspicious for actinic keratosis as they represent precancer and possible evolution to nonmelanoma skin cancer. Therapeutic options discussed; will proceed with empiric treatment as below. However, patient will contact this author if the lesions fail to resolve or worsen, changes unsatisfactorily, or symptoms arise. she verbalized understanding and is in agreement with this recommendation/plan.  -LN2 -f/t/f x 3 lesions at LV on 04/25    # History of basal cell carcinoma  - most recently right cheek, s/p mohs with Dr. Ruven Coy on 09/14/23  - No evidence of recurrence today; scars present without repigmentation or nodularity  - This author recommends regular monitoring/observation at this time. Patient agreed, but will return if changes noted or symptoms arise. she verbalized understanding and is in agreement with this recommendation/plan.  - Counseled on appropriate sun avoidance by limiting outdoor activities between 10AM-4PM, applying sunscreen with SPF 30 or above (with reapplication if wiped off or per instructions on bottle), and using a physical blockade by protective clothing. The ABCDEs of melanoma was discussed.  - Patient's questions/concerns addressed\    # Melanocytic nevus  # Seborrheic keratosis  # Lentigo  # Diffuse photodamage (chronic, not at goal)  # Sebaceous hyperplasia  - Benign diagnosis, etiology, expected course/progression discussed with patient, who verbalized understanding.  - This author recommends monitoring/observation at this time. Patient agreed, but will return if changes noted or symptoms arise. she verbalized understanding and is in agreement with this recommendation/plan.  - Counseled on appropriate sun avoidance by limiting outdoor activities between 10AM-4PM, applying sunscreen with SPF 30 or above (with reapplication if wiped off or per instructions on bottle), and using a physical blockade by protective clothing. The ABCDEs of melanoma was discussed.  - Patient's questions/concerns addressed  - Start Rx:  Tretinoin 0.025% topical cream nightly. Stop if too irritating. May sandwich with over-the-counter moisturizer     RTC 6 FBSE, sooner as needed    In the presence of Anselmo Bast, MD,  I have taken down these notes, Alisha, Scribe. 11/02/2023 8:40 AM            Anselmo Bast, MD MSc FAAD  Assistant Professor - Division of Dermatology  Department of Internal Medicine  [facility]  [facility]

## 2023-11-03 ENCOUNTER — Ambulatory Visit: Admit: 2023-11-03 | Discharge: 2023-11-03 | Payer: MEDICARE

## 2023-11-03 ENCOUNTER — Ambulatory Visit: Admit: 2023-11-03 | Discharge: 2023-11-04 | Payer: MEDICARE

## 2023-11-03 ENCOUNTER — Encounter: Admit: 2023-11-03 | Discharge: 2023-11-03 | Payer: MEDICARE

## 2023-11-03 DIAGNOSIS — Z4889 Encounter for other specified surgical aftercare: Secondary | ICD-10-CM

## 2023-11-03 DIAGNOSIS — M5416 Radiculopathy, lumbar region: Secondary | ICD-10-CM

## 2023-11-03 DIAGNOSIS — Z92241 Personal history of systemic steroid therapy: Secondary | ICD-10-CM

## 2023-11-03 MED ORDER — IOHEXOL 300 MG IODINE/ML IV SOLN
1 mL | Freq: Once | 0 refills | Status: CP
Start: 2023-11-03 — End: ?

## 2023-11-03 MED ORDER — TRIAMCINOLONE ACETONIDE 40 MG/ML IJ SUSP
80 mg | Freq: Once | EPIDURAL | 0 refills | Status: CP
Start: 2023-11-03 — End: ?

## 2023-11-03 MED ORDER — LIDOCAINE (PF) 10 MG/ML (1 %) IJ SOLN
2 mL | Freq: Once | INTRAMUSCULAR | 0 refills | Status: CP
Start: 2023-11-03 — End: ?

## 2023-11-03 MED ORDER — SODIUM CHLORIDE 0.9 % IJ SOLN
2 mL | Freq: Once | INTRAMUSCULAR | 0 refills | Status: CP
Start: 2023-11-03 — End: ?

## 2023-11-03 NOTE — Progress Notes
 SPINE CENTER  INTERVENTIONAL PAIN PROCEDURE HISTORY AND PHYSICAL    No chief complaint on file.      HISTORY OF PRESENT ILLNESS:  Cindy Randolph is a 63 y.o. year old female who presents for injection.  Denies fevers, chills, or recent hospitalizations.  Patient denies currently taking blood thinning medications.       Past Medical History:    Accidental fall    Acquired hypothyroidism    Actinic keratosis    Adverse drug reaction    Allergy    Anxiety    Aortic aneurysm    Ashkenazi Jewish ancestry requiring population-specific genetic screening    Back pain    Breast cancer in female (CMS-HCC)    Cancer of thyroid (CMS-HCC)    Chest pain    Coronary artery disease    Coronary atherosclerosis    Degenerative disc disease, cervical    Degenerative disc disease, lumbar    Degenerative disc disease, thoracic    Depressive disorder, not elsewhere classified    Dizziness    Esophageal stricture    Essential hypertension    Family history of coronary artery disease in brother    Fibromyalgia    Genetic testing    GERD (gastroesophageal reflux disease)    H/O total thyroidectomy    Hashimoto's thyroiditis    Heart murmur    Hiatal hernia    High cholesterol    History of bilateral mastectomy    History of blood in urine    History of external beam radiation therapy    History of thyroid cancer    Hormone replacement therapy    Hx antineoplastic chemo    Hyperparathyroidism    Hyperthyroidism    Hyponatremia    Hypothyroid    Incontinence    Infection    Inflammatory arthritis    Joint pain    Limb alert care status    Low vitamin D level    Migraine    Mild mitral and aortic regurgitation    Obstructive sleep apnea    Osteoporosis    Other and unspecified hyperlipidemia    Other malignant neoplasm without specification of site    Peripheral neuropathy    Personal history of irradiation    Postmenopausal    PUD (peptic ulcer disease)    Pulmonary fibrosis (CMS-HCC)    Seasonal allergic reaction    Sleep apnea Spinal headache    Spinal stenosis    Thyroid nodule    Torn meniscus    Ulcer    Unspecified deficiency anemia       Surgical History:   Procedure Laterality Date    HX WRIST FRACTURE SURGERY  1993    Baker's thumb with fixation    ARTHROPLASTY  2001    L ACL    COLONOSCOPY  2017    normal    ARTHROSCOPY KNEE WITH PARTIAL LATERAL MENISCECTOMY AND LEFT KNEE INJECTION. Right 01/08/2021    Performed by Velta Gianotti, MD at [facility]    BILATERAL TOTAL MASTECTOMIES Bilateral 01/22/2021    Performed by Levan Razor, MD at [facility]    INTRAOPERATIVE SENTINEL LYMPH NODE IDENTIFICATION WITH/ WITHOUT NON-RADIOACTIVE DYE INJECTION Left 01/22/2021    Performed by Levan Razor, MD at [facility]    INJECTION RADIOACTIVE TRACER FOR SENTINEL NODE IDENTIFICATION Left 01/22/2021    Performed by Levan Razor, MD at [facility]    LEFT AXILLARY SENTINEL LYMPH NODE BIOPSY Left 01/22/2021  Performed by Levan Razor, MD at [facility]    BILATERAL CHEST FLAT CLOSURE Bilateral 01/22/2021    Performed by Tomie Foyer, MD at [facility]    BILATERAL CHEST FLAT CLOSURE x 8 Bilateral 01/22/2021    Performed by Tomie Foyer, MD at [facility]    EXCISION BENIGN LESION 0.5 CM OR LESS - TORSO Right 01/22/2021    Performed by Tomie Foyer, MD at [facility]    TUNNELED VENOUS PORT PLACEMENT Right 02/19/2021    For chemo; Removed a few months later    Placement of port-a-cath - 22F Right 02/19/2021    Performed by Freund, Gill Lace., MD at [facility]    FLUOROSCOPIC GUIDANCE CENTRAL VENOUS ACCESS DEVICE PLACEMENT/ REPLACEMENT/ REMOVAL N/A 02/19/2021    Performed by Babetta Lesch., MD at [facility] OR    Left Completion Axillary Lymph Node Dissection Left 05/14/2021    Performed by Levan Razor, MD at [facility]    REMOVAL TUNNELED CENTRAL VENOUS ACCESS DEVICE INCLUDING PORT/ PUMP Right 05/14/2021    Performed by Levan Razor, MD at [facility]    COLONOSCOPY DIAGNOSTIC WITH SPECIMEN COLLECTION BY BRUSHING/ WASHING - FLEXIBLE N/A 10/14/2021    Performed by Fredna Jasper, MD at [facility]    ESOPHAGOGASTRODUODENOSCOPY WITH SPECIMEN COLLECTION BY BRUSHING/ WASHING N/A 06/17/2022    Performed by Aleda Hurl, MD at [facility] ENDO    ESOPHAGOGASTRODUODENOSCOPY WITH DILATION ESOPHAGUS WITH BALLOON 30 MM OR GREATER - FLEXIBLE N/A 06/17/2022    Performed by Aleda Hurl, MD at [facility] ENDO    ANGIOGRAPHY CORONARY ARTERY WITH LEFT HEART CATHETERIZATION N/A 07/16/2022    Performed by Adriana Hopping, MD at [facility] CATH LAB    PERCUTANEOUS CORONARY STENT PLACEMENT WITH ANGIOPLASTY N/A 07/16/2022    Performed by Adriana Hopping, MD at [facility] CATH LAB    ESOPHAGEAL MOTILITY STUDY N/A 02/08/2023    Performed by Dearl Fabry, MD at [facility] ENDO    ESOPHAGOGASTRODUODENOSCOPY WITH BIOPSY - FLEXIBLE N/A 02/08/2023    Performed by Dearl Fabry, MD at [facility] ENDO    GASTROESOPHAGEAL REFLUX TEST WITH MUCOSAL ATTACHED TELEMETRY PH ELECTE N/A 02/08/2023    Performed by Dearl Fabry, MD at [facility] ENDO    ESOPHAGOGASTRODUODENOSCOPY WITH DILATION GASTRIC/ DUODENAL STRICTURE - FLEXIBLE N/A 02/08/2023    Performed by Dearl Fabry, MD at [facility] ENDO    LAPAROSCOPIC REPAIR PARAESOPHAGEAL HERNIA WITHOUT FUNDOPLASTY N/A 06/15/2023    Performed by Teddie Favre, MD at [facility] OR    LAPAROSCOPIC REPAIR PARAESOPHAGEAL HERNIA WITH FUNDOPLASTYUSING TIF DEVICE N/A 06/15/2023    Performed by Dearl Fabry, MD at [facility] OR    ESOPHAGOGASTRODUODENOSCOPY WITH SPECIMEN COLLECTION BY BRUSHING/ WASHING N/A 06/15/2023    Performed by Dearl Fabry, MD at [facility] OR    ACL RECONSTRUCTION  06/03/1999    BLADDER SURGERY  Nov 2024    [place] procedure by non-[facility] doc    BREAST CYST ASPIRATION  Dec 2000    Neg for cancer, left breast cyst    BREAST SURGERY  01/22/2021    Bilat mastectomy    CARDIAC CATHERIZATION  07/16/2022    Dr. Adrian Alba, no stent    CARDIOVASCULAR STRESS TEST  Feb 2024    At [facility]    ECHOCARDIOGRAM PROCEDURE  2022    See oncology history    ELECTROCARDIOGRAM  06/17/22    Most recent 12 lead done post egd  ESOPHAGUS SURGERY      Dilation    EVENT MONITOR  February 2024    FRACTURE SURGERY  1993    ORIF L 1st metacarpal    HX BREAST BIOPSY  2022    See [place] records positive for lobular    HX CARPAL TUNNEL RELEASE  1996    With second and other oregbnancies    HX EPISIOTOMY  1994    Healed well    HX HEART CATHETERIZATION  Feb 2024 dx    No stents. (2) 50% blockages LAD    HX MASTECTOMY  09/03/222    Bilateral; cancer in left breast    HX THYROIDECTOMY  1980    Parathyroids not removed    KNEE SURGERY  L ACL as above    LAPAROSCOPY  Bladder April 2025    Knee, bladder    LYMPH NODE DISSECTION  12/29/222    Left    PORTACATH PLACEMENT  02/05/2021    Date approximate; for chemo, right. Removed 04/2021    PR LAPAROSCOPY SURG RPR INITIAL INGUINAL HERNIA  Jun 14, 2023    Esophageal    SPINE SURGERY  03/2023    MILD at [place], Dr. Ellouise Haagensen    SURGERY  [place] 2025    MOS procedure x2 face    THYROIDECTOMY  1980    UMBILICAL ARTERIAL CATH - BEDSIDE  February, 2024 at [place]    Mild CAD       family history includes Alcohol abuse in her father; Arthritis in her mother and paternal grandmother; Arthritis-osteo in her paternal grandmother; Asthma in her brother; Back pain in her brother, brother, brother, mother, and paternal grandmother; Basal Cell Carcinoma in her brother, brother, and brother; Birth Defect in her daughter and nephew; Cancer in her brother, mother, and other family members; Cancer-Breast (age of onset: 20) in her mother; Kandra Orn (age of onset: 30) in her maternal great-aunt; Cancer-Skin in her brother; Coronary Artery Disease in her father; Diabetes in her brother, brother, brother, father, maternal grandfather, paternal grandfather, and paternal grandmother; Early Death in her father; Growth/Development Disorders in her daughter; Heart Attack in her father; Heart Disease in her father; Heart problem in her brother and father; High Cholesterol in her brother; Hypertension in her brother, brother, brother, father, maternal grandfather, and mother; Joint Pain in her mother; Miscarriage in her daughter and mother; Miscarriages in her mother; Neck Pain in her mother; Stroke in her brother and maternal uncle; Sudden Cardiac Death in her father; Thyroid Disease in her maternal uncle, maternal uncle and another family member.    Social History     Socioeconomic History    Marital status: Married    Number of children: 8   Occupational History    Occupation: Retired Engineer, civil (consulting)     Comment: Worked in Multimedia programmer; also worked in the ambulatory setting   Tobacco Use    Smoking status: Never     Passive exposure: Past    Smokeless tobacco: Never    Tobacco comments:     Significant second hand smoke from parents 77-49 years old   Vaping Use    Vaping status: Never Used   Substance and Sexual Activity    Alcohol use: Not Currently     Comment: Prior to 2020, socially, one drink once a week at most    Drug use: Never     Comment: in late teens, early twenties, a few times and not since    Sexual  activity: Not Currently     Partners: Male     Birth control/protection: Post-menopausal, None       Allergies   Allergen Reactions    Mango ANAPHYLAXIS    Other [Unclassified Drug] ANAPHYLAXIS     DUCK Meat and Eggs     Imdur [Isosorbide Mononitrate] CHEST TIGHTNESS and RASH    Iodinated Contrast Media RASH and ITCHING     04/2023 pt developed throat itching after IV Contrast in CT, itching subsided after 5 minutes without intervention.   She developed rash on back and neck after hospitalization 06/2022 where she underwent heart cath. Unsure if contrast was   cause of rash, but it developed shortly after procedure.    Oxycodone NAUSEA ONLY     Prefers tramadol    Sudafed [Pseudoephedrine Hcl] PALPITATIONS       There were no vitals filed for this visit.          REVIEW OF SYSTEMS: 10 point ROS obtained and negative except low back pain      PHYSICAL EXAM:  General: 63 y.o. female appears stated age, in no acute distress  HEENT: Normocephalic, atraumatic  Neck: No thyroidmegaly  Cardiovascular: Well perfused  Pulmonary: Unlabored respirations  Extremities: No cyanosis, clubbing, or edema  Skin: No lesions seen on exposed skin  Psychiatric:  Appropriate mood and affect  Musculoskeletal: No atrophy.   Neurologic: Antigravity strength in all extremities. CN II -XII grossly intact.  Alert and oriented x 3.           IMPRESSION:    1. Lumbar radiculopathy         PLAN: Lumbar Epidural Steroid Injection L4-L5

## 2023-11-03 NOTE — Procedures
 Attending Surgeon: Armen Land, MD    Anesthesia: Local      Epidural Steroid Injection Lumbar/Caudal  Procedure: epidural - interlaminar    Laterality: n/a   on 11/03/2023 3:45 PM  Location: lumbar ESI with imaging - L4-5      Consent:   Consent obtained: verbal and written  Consent given by: patient  Risks discussed: bleeding, bruising, infection, weakness and no change or worsening in pain    Discussed with patient the purpose of the treatment/procedure, other ways of treating my condition, including no treatment/ procedure and the risks and benefits of the alternatives. Patient has decided to proceed with treatment/procedure.        Universal Protocol:  Relevant documents: relevant documents present and verified  Test results: test results available and properly labeled  Imaging studies: imaging studies available  Required items: required blood products, implants, devices, and special equipment available  Site marked: the operative site was marked  Patient identity confirmed: Patient identify confirmed verbally with patient.        Time out: Immediately prior to procedure a time out was called to verify the correct patient, procedure, equipment, support staff and site/side marked as required      Procedures Details:   Indications: pain   Prep: chlorhexidine  Number of Levels: 1  Approach: left paramedian  Needle size: 18 G  Patient tolerance: Patient tolerated the procedure well with no immediate complications. Pressure was applied, and hemostasis was accomplished.  Comments: DESCRIPTION OF PROCEDURE:  The procedure risks and benefits were explained to the patient and informed consent was obtained.  The patient was positioned prone on the fluoroscopy table with a pillow under the abdomen to help reduce lumbar lordosis.  Blood pressure cuff and oxygen saturation monitors were attached and the patient was monitored throughout the entire procedure.  The L4 vertebral level was identified with the use of fluoroscopy in the AP view.  The skin was prepped using Chlorhexadine and draped in aseptic fashion.  The skin and subcutaneous tissue were anesthetized using 3 mL of 1 percent lidocaine with a 27-gauge needle.  A 3.5 inch 22-gauge Tuohy needle was slowly advanced using AP view towards the midline L4-L5 epidural space.  The latter part of the needle advancement was guided with fluoroscopy in the lateral view.  The epidural space was identified using loss of resistance technique.  After negative aspiration, 1 mL of Isovue contrast dye was injected.  After epidural spread was seen, a solution containing 80 mg of triamcinolone and 2 mL of normal saline was injected in increments.  The stylet was reinserted and the needle was then removed.     After the procedure, the patient's blood pressure, heart rate, oxygen saturation, and VAS were recorded in the chart. There were no complications.  The patient tolerated the procedure well and was brought to recovery room for observation in stable condition and discharged with written discharge instructions.     PLAN OF CARE:  The patient is to followup in 6 weeks.    The patient was advised to contact the [facility] for any of the following    1. Fever, chills, or night sweats.  2. New onset of severe sharp pain.  3. Any new upper or lower extremity weakness or numbness.  4. Any questions regarding the procedure.  This patient's clinical history, exam, AND imaging support radiculopathy AND there is a significant impact on quality of life and function AND the pain has been  present for at least 4 weeks AND they have failed to improve with noninvasive conservative care.    This patient failed to have improvement of their pain with epidural injection. Given the severity of their pain, a repeat injection is being performed at least 14 days later with a different Approach.      Estimated blood loss: none or minimal  Specimens: none  Patient tolerated the procedure well with no immediate complications. Pressure was applied, and hemostasis was accomplished.  Administrations This Visit       iohexoL (OMNIPAQUE-300) 300 mg/mL injection 1 mL       Admin Date  11/03/2023 Action  Given Dose  1 mL Route  SEE ADMIN INSTRUCTIONS Documented By  Carlette Cheers, RN              lidocaine PF 1% (10 mg/mL) injection 2 mL       Admin Date  11/03/2023 Action  Given Dose  2 mL Route  Injection Documented By  Carlette Cheers, RN              sodium chloride PF 0.9% injection 2 mL       Admin Date  11/03/2023 Action  Given Dose  2 mL Route  Injection Documented By  Carlette Cheers, RN              triamcinolone acetonide [facility]) injection 80 mg       Admin Date  11/03/2023 Action  Given Dose  80 mg Route  Epidural Documented By  Carlette Cheers, RN

## 2023-11-03 NOTE — Discharge Instructions - Supplementary Instructions
 GENERAL POST PROCEDURE INSTRUCTIONS  Physician: _________________________________  Procedure Completed Today:  Joint Injection (hip, knee, shoulder)  Cervical Epidural Steroid Injection  Cervical Transforaminal Steroid Injection  Trigger Point Injection  Caudal Epidural Steroid Injection  Piriformis Injection  Pudendal Nerve Block  Other _____________________ Thoracic Epidural Steroid Injection  Lumbar Epidural Steroid Injection  Lumbar Transforaminal Steroid Injection  Facet Joint Injection  Celiac Nerve Block  Sacrococcygeal  Sacroiliac Joint Injection   Important information following your procedure today:  You may drive today     If you had sedation, you may NOT drive today  Rest at home for the next 6 hours.  You may then begin to resume your normal activities.  DO NOT drive any vehicle, operate any power tools, drink alcohol, make any major decisions, or sign any legal documents for the next 12 hours.  Pain relief may not be immediate. It is possible you may even experience an increase in pain during the first 24-48 hours followed by a gradual decrease of your pain.  Though the procedure is generally safe, and complications are rare, we do ask that you be aware of any of the following:  Any swelling, persistent redness, new bleeding or drainage from the site of the injection.  You should not experience a severe headache.  You should not run a fever over 101oF.  New onset of sharp, severe back and or neck pain.  New onset of upper or lower extremity numbness or weakness.  New difficulty controlling bowel or bladder function after injection.  New shortness of breath.  ** If any of these occur, please call to report this occurrence to the nurse of Dr. Noralyn Pick at [phone]. If you are calling after 4:00 p.m. or on weekends or holidays, please call [phone] and ask to have the resident physician on call for the physician paged or go to your local emergency room.  You may experience soreness at the injection site. Ice can be applied at 20-minute intervals for the first 24 hours. The following day you may alternate ice with heat if you are experiencing muscle tightness, otherwise continue with ice. Ice works best at decreasing pain. Avoid application of direct heat, hot showers or hot tubs today.  Avoid strenuous activity today. You many resume your regular activities and exercise tomorrow.  Patients with diabetes may see an elevation in blood sugars for 7-10 days after the injection. It is important to pay close attention to your diet, check your blood sugars daily and report extreme elevations to the physician that manages your diabetes.  Patients taking daily blood thinners can resume their regular dose this evening.  It is important that you take all medications ordered by your pain physician. Taking medications as ordered is an important part of your pain care plan. If you cannot continue the medication plan, please notify the physician.    Possible side effects to steroids that may occur:  Flushing or redness of the face  Irritability  Fluid retention  Change in women's menses  Minor headache    If you are unable to keep your upcoming appointment, please notify the [facility] scheduler at [phone] at least 24 hours in advance. If you have questions for the surgery center, call [facility] at [phone].

## 2023-11-03 NOTE — Progress Notes
 DERMATOLOGIC SURGERY F/U NOTE    CC: Scar evaluation    HPI: Cindy Randolph is a 63 y.o. female who presents for follow-up of scar s/p Mohs surgery on the r and l cheek and . Pt reports no issues today .    ROS: Patient denies any other skin complaints    PE:   Constitutional: Patient is well appearing in no acute distress.  Mood/affect are pleasant and appropriate. Alert and oriented to person, place, time.    - well-healing surgical scars    A/P:  Scar s/p Mohs micrographic surgery  -  Well-healing scar  -  reviewed natural course of wound healing  - reviewed lasers for improved cosmetic appearance  -  Patient informed to call the office if any issues were to arise in the future.

## 2023-11-04 ENCOUNTER — Encounter: Admit: 2023-11-04 | Discharge: 2023-11-04 | Payer: MEDICARE

## 2023-11-08 ENCOUNTER — Encounter: Admit: 2023-11-08 | Discharge: 2023-11-08 | Payer: MEDICARE

## 2023-11-08 ENCOUNTER — Ambulatory Visit: Admit: 2023-11-08 | Discharge: 2023-11-08 | Payer: MEDICARE

## 2023-11-08 DIAGNOSIS — G4700 Insomnia, unspecified: Secondary | ICD-10-CM

## 2023-11-08 DIAGNOSIS — G4733 Obstructive sleep apnea (adult) (pediatric): Secondary | ICD-10-CM

## 2023-11-08 DIAGNOSIS — G2581 Restless legs syndrome: Secondary | ICD-10-CM

## 2023-11-08 MED ORDER — ACETAMINOPHEN 500 MG PO TAB
500 mg | Freq: Once | ORAL | 0 refills
Start: 2023-11-08 — End: ?

## 2023-11-08 MED ORDER — IRON DEXTRAN IVPB
975 mg | Freq: Once | INTRAVENOUS | 0 refills
Start: 2023-11-08 — End: ?

## 2023-11-08 MED ORDER — IRON DEXTRAN 50 MG/ML IV TEST DOSE
25 mg | Freq: Once | INTRAVENOUS | 0 refills
Start: 2023-11-08 — End: ?

## 2023-11-08 MED ORDER — DIPHENHYDRAMINE HCL 25 MG PO CAP
25 mg | Freq: Once | ORAL | 0 refills
Start: 2023-11-08 — End: ?

## 2023-11-08 NOTE — Progress Notes
 Date of Service: 11/08/2023    Subjective:             Cindy Randolph is a 63 y.o. female.    History of Present Illness    OSA: CPAP compliance has slipped a bit. Has been waking up with dry mouth and bad headaches. Tried Flonase and Zyrtec but dried her out a little. She has recently seen ENT and has undergone a lip biopsy. When she initially starts CPAP, she feels herself reaching for air. She is motivated to use the machine. Feels there has been some difference in the mask as well.     Insomnia: Tried Trazodone 50 mg, prescribed the last visit. Finds little benefit. Felt she encountered nightmares and some next day fogginess. Her PCP is interested in try buspirone for her. Has noticed an improvement in her anxiety.   Bedtime: 9 pm  Sleep onset latency: 10-15 minutes.   Multiple awakenings in the night. Has been limiting fluid intake and getting pets the heck out.  She does not remember many of these awakenings. Will remember 3-4 of these awakenings.   Feels lucky if she can get around 4 hours straight.   She aims to get up around 5-6 am or so.   She has tried the CBT-I coach app. Has tried to incorporate some of them into her daily schedule.     Restless leg syndrome: 1/week in the last month.  Has been taking oral iron supplements. Feels legs are better. She did report constipation which caused some pelvic/core issues.            Objective:         acetaminophen (TYLENOL) 325 mg tablet Take two tablets by mouth every 6 hours. Take scheduled for 3 days after surgery, then as needed. Do not exceed 4,000mg  in a 24 hour period.    amLODIPine (NORVASC) 5 mg tablet Take one tablet by mouth daily.    aspirin 81 mg chewable tablet Chew one tablet by mouth daily.    biotin 1 mg cap Take one capsule by mouth daily.    busPIRone (BUSPAR) 5 mg tablet Take one tablet by mouth twice daily.    calcium carbonate (OS-CAL) 1250 mg (500 mg elemental calcium) tablet Take one tablet by mouth daily.    CHOLEcalciferoL (vitamin D3) 5000 unit tablet Take one tablet by mouth daily.    clobetasoL (TEMOVATE) 0.05 % topical ointment Apply  topically to affected area twice daily. Apply to areas of rash on hands twice daily; do NOT use on face/groin/underarms, use up to 2 weeks per month    diclofenac sodium (VOLTAREN) 1 % topical gel Apply four g topically to affected area daily as needed.    ezetimibe (ZETIA) 10 mg tablet Take one tablet by mouth daily.    ferrous sulfate (SLOW RELEASE IRON) 142 mg (45 mg iron) ER tablet Take one tablet by mouth daily.    Fish,Bora,Flax Oils-OM3,6,9 #1 (TRIPLE OMEGA) 400-400-400 mg capsule Take one capsule by mouth at bedtime daily.    inclisiran (LEQVIO) 284 mg/1.5 mL injection syringe Inject 1.5 mL under the skin every 180 days. Initially then at 3 months and then every 6 months    metoprolol succinate XL (TOPROL XL) 50 mg extended release tablet Take one tablet by mouth at bedtime daily.    Miscellaneous Medical Supply misc DME requirements, rx to include the following typed information:    NPI: [id number]  Doctor's typed name: [name],  MD  ICD 10: G47.33  Length of need: 99 months  Start date:02/14/2023  Signature date: 02/14/2023  Detailed equipment: CPAP machine with any accessories.    naltrexone 4.5 MG oral capsule (COMPOUND) Take one capsule by mouth daily.    other medication Take one Dose by mouth daily. K Complete K1 & K2 as MK-4 & MK-7 : Take 1 softgel by mouth once daily    Selenium 100 mcg tab Take one tablet by mouth daily.    SUMAtriptan succinate (IMITREX) 25 mg tablet Take one tablet by mouth at onset of headache. May repeat after 2 hours if needed. Max of 200 mg in 24 hours.    SYNTHROID 100 mcg tablet Take one tablet by mouth daily 30 minutes before breakfast.    tiZANidine (ZANAFLEX) 2 mg tablet Take one tablet by mouth twice daily as needed.    tretinoin (RETIN-A) 0.025 % topical cream Apply  topically to affected area at bedtime daily.    vit A/vit C/vit E/zinc/copper (PRESERVISION AREDS PO) Take 1 tablet by mouth daily.    vitamins, B complex tab Take one tablet by mouth daily.    vitamins, multiple cap Take one capsule by mouth daily.     Vitals:    11/08/23 1045   BP: 127/85   BP Source: Arm, Right Upper   Pulse: 69   Temp: 36.7 ?C (98 ?F)   Resp: 16   SpO2: 97%   TempSrc: Oral   Weight: 71.7 kg (158 lb)   Height: 158.8 cm (5' 2.5)     Body mass index is 28.44 kg/m?[name]     Physical Exam  Vitals and nursing note reviewed.   Constitutional:       Appearance: Normal appearance.   HENT:      Mouth/Throat:      Mouth: Mucous membranes are moist.   Neurological:      Mental Status: She is alert and oriented to person, place, and time.   Psychiatric:         Mood and Affect: Mood normal.          Assessment and Plan:    Problem   Restless Leg Syndrome    She does report an itchy, creepy crawly sensation of her legs that keeps her up at night.   Prior to January, it was happening 4-5 times/night.   A few nights, it has been happening every night.   Usually lasts 10-15 minutes or so.   She has tried Gabapentin and Lyrica in the past (this was more so for fibromyalgia), but this gave her nightmares.      Osa (Obstructive Sleep Apnea)    Last sleep study was a split night study on 01/04/2023.   This was at [facility].   AHI 3% 12.9  AHI 4% 7.2  Was titrated to CPAP of 6 cm H2O.      Insomnia        OSA (obstructive sleep apnea)  Compliance download discussed with the patient.   Noticeable decrease in compliance, patient reports that this is associated with dryness issues.   We will go up on the humidity settings.  Encouraged the patient to keep an ambient humidifier in the room.   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 Weight loss, positional therapy or [facility] would be reasonable options.  AVS options provided.   She reports a sensation of needing to  pull more air. CPAP turns on.  We will switch off ramp.    Restless leg syndrome  Last iron studies showed iron deficiency [ferritin was 58]  Currently on oral iron supplementation, did notice improvement in restless leg symptoms but had constipation as an undesirable side effect.   We will repeat iron studies, if ferritin less than 75 ng/mL, she would be a good candidate for IV iron replacement.   We hope that treating her restless leg syndrome helps with some of the middle of the night awakenings.     Insomnia  Discussed sleep society guidelines recommend CBT-I as first line.   This is a good option for highly motivated patients.   It consists of 4-6 sessions chiefly comprising of behavioral strategies to re-orient the relationship of patient with bed and sleep.   Telehealth referral/options provided on AVS.     Seen and discussed with Dr. Jackquline Marden Russel, MBBS  Sleep Medicine Fellow, [facility]  NPI: [id number]

## 2023-11-08 NOTE — Telephone Encounter
 Pt notified of below results.  See Mychart encounter dated 11/08/23.Order placed.

## 2023-11-08 NOTE — Assessment & Plan Note
 Discussed sleep society guidelines recommend CBT-I as first line.   This is a good option for highly motivated patients.   It consists of 4-6 sessions chiefly comprising of behavioral strategies to re-orient the relationship of patient with bed and sleep.   Telehealth referral/options provided on AVS.

## 2023-11-08 NOTE — Telephone Encounter
-----   Message from Marden Russel, MBBS sent at 11/08/2023  3:51 PM CDT -----  Iron levels show continued iron deficiency. She is a candidate for IV iron replacement given undesirable side effects to oral iron. Please order iron dextran, thank you!      ----- Message -----  From: Leverne Malm, Background User  Sent: 11/08/2023   3:40 PM CDT  To: Marden Russel, MBBS

## 2023-11-08 NOTE — Assessment & Plan Note
 Last iron studies showed iron deficiency [ferritin was 58]  Currently on oral iron supplementation, did notice improvement in restless leg symptoms but had constipation as an undesirable side effect.   We will repeat iron studies, if ferritin less than 75 ng/mL, she would be a good candidate for IV iron replacement.   We hope that treating her restless leg syndrome helps with some of the middle of the night awakenings.

## 2023-11-09 ENCOUNTER — Ambulatory Visit: Admit: 2023-11-09 | Discharge: 2023-11-09 | Payer: MEDICARE

## 2023-11-09 ENCOUNTER — Encounter: Admit: 2023-11-09 | Discharge: 2023-11-09 | Payer: MEDICARE

## 2023-11-10 ENCOUNTER — Encounter: Admit: 2023-11-10 | Discharge: 2023-11-10 | Payer: MEDICARE

## 2023-11-11 ENCOUNTER — Ambulatory Visit: Admit: 2023-11-11 | Discharge: 2023-11-11 | Payer: MEDICARE

## 2023-11-11 ENCOUNTER — Encounter: Admit: 2023-11-11 | Discharge: 2023-11-11 | Payer: MEDICARE

## 2023-11-11 ENCOUNTER — Ambulatory Visit: Admit: 2023-11-11 | Discharge: 2023-11-12 | Payer: MEDICARE

## 2023-11-11 DIAGNOSIS — M79641 Pain in right hand: Secondary | ICD-10-CM

## 2023-11-11 DIAGNOSIS — M180 Bilateral primary osteoarthritis of first carpometacarpal joints: Secondary | ICD-10-CM

## 2023-11-11 MED ORDER — TRIAMCINOLONE ACETONIDE 40 MG/ML IJ SUSP
40 mg | Freq: Once | INTRAMUSCULAR | 0 refills | Status: CP | PRN
Start: 2023-11-11 — End: ?

## 2023-11-11 MED ORDER — LIDOCAINE (PF) 10 MG/ML (1 %) IJ SOLN
1 mL | Freq: Once | INTRAMUSCULAR | 0 refills | Status: CP | PRN
Start: 2023-11-11 — End: ?

## 2023-11-11 NOTE — Patient Instructions
 It was a pleasure seeing you today. Please contact us if you have any further questions, we are happy to assist.     For nursing/medical/casting or splinting questions, please send a message through MyChart. This is our preferred method of contact and the most efficient way to contact your healthcare team. If you do not have internet access/smartphone you can call my Clinical Nurse Coordinator at [phone]. Please do not leave multiple voicemail's for [place]. Leaving multiple voicemail's delays [place] getting back to you quicker.    NOTE: MyChart messages and phone calls received on weekends, on holidays, and after 4 pm on weekdays will NOT be seen until the following business day.     - For medication refills, please ask your pharmacy to send an electronic request.  Please allow at least 3 business days for medication refills.    - To cancel, change, or schedule a clinic appointment: Call  Scheduling at [phone].  - For any radiology concerns or scheduling, please call [phone].     Any insurance, disability or Family Medical Leave Act [facility]) forms can be faxed to [phone].  Be sure to include your name and date of birth on the form. We will complete and fax the forms where they need to go as soon as we are able. For any questions about disability or FMLA paperwork, please contact Dr. Darden Dates administrative assistant, Karna Christmas at [phone].    Do not hesitate to contact our office with any further questions.Thank you and have a great day!    Philomena Course, MD   [facility]   Orthopedic & Sports Medicine  Office: [phone]  Fax: [phone]

## 2023-11-11 NOTE — Progress Notes
 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  We started conservative care at that time with steroid injections as well as MetaGrip bracing.  She says the injections were somewhat effective although afterward she developed some radiating pain up her radial forearms bilaterally.  She has not had return of pain symptoms although the symptoms seem to be more distal in the area of the MP joints bilaterally.  She denies any sensory disturbances.  She did not find the braces to be terribly helpful.  Cindy Randolph is a 63 y.o. female.     Review of Systems      Objective:          acetaminophen  (TYLENOL ) 325 mg tablet Take two tablets by mouth every 6 hours. Take scheduled for 3 days after surgery, then as needed. Do not exceed 4,000mg  in a 24 hour period.    amLODIPine  (NORVASC ) 5 mg tablet Take one tablet by mouth daily.    aspirin  81 mg chewable tablet Chew one tablet by mouth daily.    biotin 1 mg cap Take one capsule by mouth daily.    busPIRone  (BUSPAR ) 5 mg tablet Take one tablet by mouth twice daily.    calcium  carbonate (OS-CAL) 1250 mg (500 mg elemental calcium ) tablet Take one tablet by mouth daily.    CHOLEcalciferoL (vitamin D3) 5000 unit tablet Take one tablet by mouth daily.    clobetasoL  (TEMOVATE ) 0.05 % topical ointment Apply  topically to affected area twice daily. Apply to areas of rash on hands twice daily; do NOT use on face/groin/underarms, use up to 2 weeks per month    diclofenac  sodium (VOLTAREN ) 1 % topical gel Apply four g topically to affected area daily as needed.    ezetimibe  (ZETIA ) 10 mg tablet Take one tablet by mouth daily.    ferrous sulfate (SLOW RELEASE IRON ) 142 mg (45 mg iron ) ER tablet Take one tablet by mouth daily.    Fish,Bora,Flax Oils-OM3,6,9 #1 (TRIPLE OMEGA) 400-400-400 mg capsule Take one capsule by mouth at bedtime daily.    inclisiran (LEQVIO ) 284 mg/1.5 mL injection syringe Inject 1.5 mL under the skin every 180 days. Initially then at 3 months and then every 6 months    metoprolol  succinate XL (TOPROL  XL) 50 mg extended release tablet Take one tablet by mouth at bedtime daily.    Miscellaneous Medical Supply misc DME requirements, rx to include the following typed information:    NPI: [id number]  Doctor's typed name: [name], MD  ICD 10: G47.33  Length of need: 99 months  Start date:02/14/2023  Signature date: 02/14/2023  Detailed equipment: CPAP machine with any accessories.    naltrexone  4.5 MG oral capsule (COMPOUND) Take one capsule by mouth daily.    other medication Take one Dose by mouth daily. K Complete K1 & K2 as MK-4 & MK-7 : Take 1 softgel by mouth once daily    Selenium 100 mcg tab Take one tablet by mouth daily.    SUMAtriptan  succinate (IMITREX ) 25 mg tablet Take one tablet by mouth at onset of headache. May repeat after 2 hours if needed. Max of 200 mg in 24 hours.    SYNTHROID  100 mcg  tablet Take one tablet by mouth daily 30 minutes before breakfast.    tiZANidine (ZANAFLEX) 2 mg tablet Take one tablet by mouth twice daily as needed.    tretinoin  (RETIN-A ) 0.025 % topical cream Apply  topically to affected area at bedtime daily.    vit A/vit C/vit E/zinc/copper (PRESERVISION AREDS PO) Take 1 tablet by mouth daily.    vitamins, B complex tab Take one tablet by mouth daily.    vitamins, multiple cap Take one capsule by mouth daily.     Vitals:    11/11/23 0757   PainSc: Four   Weight: 71.7 kg (158 lb)   Height: 160 cm (5' 3)     Body mass index is 27.99 kg/m?[name]     Physical Exam  Constitutional:       General: She is not in acute distress.     Appearance: Normal appearance.   Neurological:      Mental Status: She is oriented to person, place, and time.   Psychiatric:         Behavior: Behavior normal.       Ortho Exam  Bilateral hands: The skin is intact, there is a well-healed scar over the left thumb CMC joint consistent with prior fracture surgery.  There is swelling and enlargement of the bilateral thumb CMC joints with associated tenderness and a positive CMC grind test.  She does not have any pain with range of motion of her MP joints of the thumb and no tenderness in that area.  There is no tenderness over the radial styloid.  She has otherwise full range of motion of the wrist and digits.  There is intact sensation and brisk cap refill to all digits.       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  I counseled her that further conservative care is reasonable with another round of steroid injections.  She could also consider surgical treatment with [facility] arthroplasty which I did describe to her.  She would like to try more injections.  She is welcome to follow-up as needed.    Small Joint Drain/Inject: R thumb CMC on 11/11/2023 8:10 AM    Consent:   Consent obtained: verbal  Consent given by: patient  Risks discussed: skin discoloration, bleeding, damage to surrounding structures, hyperglycemia, infection, pain, soft tissue reaction, subcutaneous fat atrophy, tendon rupture, vasovagal reaction and arrhythmia  Alternatives discussed: alternative treatment, delayed treatment and no treatment  Discussed with patient the purpose of the treatment/procedure, other ways of treating my condition, including no treatment/ procedure and the risks and benefits of the alternatives. Patient has decided to proceed with treatment/procedure.        Universal Protocol:  Relevant documents: relevant documents present and verified  Patient identity confirmed: Patient identify confirmed verbally with patient.          Procedures Details:  Procedure Peformed: Injection Only  Indications:  Details:Prep: alcohol   25 G needle, dorsal approachMedications: 1 mL lidocaine  PF 1% (10 mg/mL); 40 mg triamcinolone  acetonide 40 mg/mL  Outcome: tolerated well, no immediate complications                             Donnice Boehringer, MD  Associate Professor  Orthopedic Hand Surgery

## 2023-11-11 NOTE — Progress Notes
 Small Joint Drain/Inject: L thumb CMC on 11/11/2023 8:10 AM    Consent:   Consent obtained: verbal  Consent given by: patient  Risks discussed: skin discoloration, bleeding, damage to surrounding structures, hyperglycemia, infection, pain, soft tissue reaction, subcutaneous fat atrophy, tendon rupture, vasovagal reaction and arrhythmia  Alternatives discussed: alternative treatment, delayed treatment and no treatment  Discussed with patient the purpose of the treatment/procedure, other ways of treating my condition, including no treatment/ procedure and the risks and benefits of the alternatives. Patient has decided to proceed with treatment/procedure.        Universal Protocol:  Relevant documents: relevant documents present and verified  Patient identity confirmed: Patient identify confirmed verbally with patient.          Procedures Details:  Procedure Peformed: Injection Only  Indications: pain  Details:Prep: alcohol   25 G needle, dorsal approachMedications: 1 mL lidocaine  PF 1% (10 mg/mL); 40 mg triamcinolone  acetonide 40 mg/mL  Outcome: tolerated well, no immediate complications

## 2023-11-15 ENCOUNTER — Encounter: Admit: 2023-11-15 | Discharge: 2023-11-15 | Payer: MEDICARE

## 2023-11-15 ENCOUNTER — Ambulatory Visit: Admit: 2023-11-15 | Discharge: 2023-11-16 | Payer: MEDICARE

## 2023-11-15 DIAGNOSIS — I10 Essential (primary) hypertension: Secondary | ICD-10-CM

## 2023-11-15 NOTE — Progress Notes
 Remote Patient Monitoring Data Review      RPM data reviewed. No alerts indicating vital signs out of protocol ranges. Called patient at this time to discuss recent readings and any concerns.    Patient's 30 day BP average is 133/88, HR 72. No symptoms reported. Patient has follow up with her cardiologist's office on 7/15. RN made plans to check in again with patient in the week before this appointment.    RN placed ticket with support to help patient with equipment repairing to BT per her request.    Remote Patient Monitoring          11/10/2023 11/11/2023 11/12/2023 11/13/2023 11/14/2023 11/15/2023   BP   Systolic BP Pt Entered 133 mmHg  123 mmHg  122 mmHg  122 mmHg  120 mmHg 126 mmHg   Diastolic BP Pt Entered 88 mmHg  82 mmHg  75 mmHg  78 mmHg  84 mmHg 80 mmHg   Heart Rate   Heart Rate Pt entered 77 bpm  71 bpm  80 bpm  72 bpm  71 bpm  66 bpm   SPO2   SPO2 Pt Entered 97 %  97 %  97 %  97 %  98 % 97 %   Weight   Weight Pt Entered 157.2 Lb 157.8 Lb 157.2 Lb 157.4 Lb 157.4 Lb 156.8 Lb      Details          More abnormal values are hidden. Newest values shown. Go to activity for more data.    More values are hidden. Newest values shown. Go to activity for more data.                Home Monitoring Minutes (this encounter): 6

## 2023-11-16 ENCOUNTER — Encounter: Admit: 2023-11-16 | Discharge: 2023-11-16 | Payer: MEDICARE

## 2023-11-16 DIAGNOSIS — M170 Bilateral primary osteoarthritis of knee: Secondary | ICD-10-CM

## 2023-11-17 ENCOUNTER — Encounter: Admit: 2023-11-17 | Discharge: 2023-11-17 | Payer: MEDICARE

## 2023-11-17 ENCOUNTER — Ambulatory Visit: Admit: 2023-11-17 | Discharge: 2023-11-18 | Payer: MEDICARE

## 2023-11-17 DIAGNOSIS — F419 Anxiety disorder, unspecified: Secondary | ICD-10-CM

## 2023-11-17 MED ORDER — MISCELLANEOUS MEDICAL SUPPLY MISC MISC
0 refills
Start: 2023-11-17 — End: ?

## 2023-11-17 MED ORDER — MISCELLANEOUS MEDICAL SUPPLY MISC MISC
0 refills | 1.00000 days | Status: AC
Start: 2023-11-17 — End: ?

## 2023-11-17 MED ORDER — BUSPIRONE 10 MG PO TAB
10 mg | ORAL_TABLET | Freq: Two times a day (BID) | ORAL | 1 refills | 30.00000 days | Status: AC
Start: 2023-11-17 — End: ?

## 2023-11-21 ENCOUNTER — Encounter: Admit: 2023-11-21 | Discharge: 2023-11-21 | Payer: MEDICARE

## 2023-11-21 DIAGNOSIS — I10 Essential (primary) hypertension: Principal | ICD-10-CM

## 2023-11-21 NOTE — Progress Notes
 RPM Monthly Monitoring Minutes    Patient submitted RPM data 25 days this month accounting for 25 minutes of monitoring. This time is in addition to time charted in individual encounters.    Home Monitoring Minutes (this encounter): 25

## 2023-11-23 ENCOUNTER — Encounter: Admit: 2023-11-23 | Discharge: 2023-11-23 | Payer: MEDICARE

## 2023-11-23 ENCOUNTER — Ambulatory Visit: Admit: 2023-11-23 | Discharge: 2023-11-23 | Payer: MEDICARE

## 2023-11-23 DIAGNOSIS — I10 Essential (primary) hypertension: Principal | ICD-10-CM

## 2023-11-23 MED ORDER — LIDOCAINE (PF) 100 MG/5 ML (2 %) IV SYRG
INTRAVENOUS | 0 refills | Status: DC
Start: 2023-11-23 — End: 2023-11-23

## 2023-11-23 MED ORDER — PROPOFOL 10 MG/ML IV EMUL 50 ML (INFUSION)(AM)(OR)
INTRAVENOUS | 0 refills | Status: DC
Start: 2023-11-23 — End: 2023-11-23

## 2023-11-23 NOTE — Anesthesia Post-Procedure Evaluation
 Post-Anesthesia Evaluation    Name: Cindy Randolph      MRN: [id number]     DOB: [birth year]     Age: 63 y.o.     Sex: female   __________________________________________________________________________     Procedure Information       Anesthesia Start Date/Time: 11/23/23 1145    Procedure: ESOPHAGOGASTRODUODENOSCOPY, WITH BALLOON DILATION OF LESS THAN 30 MILLIMETERS    Location: [facility] / [facility] OR    Surgeons: Riccardo Sane, MD            Post-Anesthesia Vitals  BP: 135/81 (07/02 1215)  Temp: 36.8 ?C (98.2 ?F) (07/02 1203)  Pulse: 66 (07/02 1215)  Respirations: 16 PER MINUTE (07/02 1215)  SpO2: 98 % (07/02 1215)  O2 Device: None (Room air) (07/02 1215)   Vitals Value Taken Time   BP 135/81 11/23/23 12:15   Temp 36.8 ?C (98.2 ?F) 11/23/23 12:03   Pulse 66 11/23/23 12:15   Respirations 16 PER MINUTE 11/23/23 12:15   SpO2 98 % 11/23/23 12:15   O2 Device None (Room air) 11/23/23 12:15   ABP     ART BP           Post Anesthesia Evaluation Note    Evaluation location: pre/post  Patient participation: recovered; patient participated in evaluation  Level of consciousness: alert    Pain score: 0  Pain management: adequate    Hydration: normovolemia  Temperature: 36.0?C - 38.4?C  Airway patency: adequate    Perioperative Events       Post-op nausea and vomiting: no PONV    Postoperative Status  Cardiovascular status: hemodynamically stable  Respiratory status: spontaneous ventilation        Perioperative Events  There were no known complications for this encounter.

## 2023-11-23 NOTE — Anesthesia Pre-Procedure Evaluation
 [facility] Anesthesia Pre-Procedure Evaluation    Name: Cindy Randolph      MRN: [id number]     DOB: Dec 29, 1960     Age: 63 y.o.     Sex: female   _________________________________________________________________________     Procedure Info:   Procedure Information       Date/Time: 11/23/23 1145    Procedure: ESOPHAGOGASTRODUODENOSCOPY, WITH BALLOON DILATION OF LESS THAN 30 MILLIMETERS    Location: [facility] OR    Surgeons: Riccardo Sane, MD            Physical Assessment  Vital Signs (last filed in past 24 hours):  Temp: 36.8 ?C (98.2 ?F) (07/02 1129)  Pulse: 75 (07/02 1129)  Respirations: 16 PER MINUTE (07/02 1129)  SpO2: 96 % (07/02 1129)  O2 Device: None (Room air) (07/02 1129)      Patient History   Allergies   Allergen Reactions    Mango ANAPHYLAXIS    Other [Unclassified Drug] ANAPHYLAXIS     DUCK Meat and Eggs     Imdur  [Isosorbide  Mononitrate] CHEST TIGHTNESS and RASH    Iodinated Contrast Media RASH and ITCHING     04/2023 pt developed throat itching after IV Contrast in CT, itching subsided after 5 minutes without intervention.   She developed rash on back and neck after hospitalization 06/2022 where she underwent heart cath. Unsure if contrast was   cause of rash, but it developed shortly after procedure.    Macrobid  [Nitrofurantoin  Monohyd/M-Cryst] SEE COMMENTS     Patient has pulmonary fibrosis so this antibiotic is not preferred unless this is the only medication available .     Oxycodone  NAUSEA ONLY     Prefers tramadol     Sudafed [Pseudoephedrine Hcl] PALPITATIONS        Current Medications    Medication Directions   acetaminophen  (TYLENOL ) 325 mg tablet Take two tablets by mouth every 6 hours. Take scheduled for 3 days after surgery, then as needed. Do not exceed 4,000mg  in a 24 hour period.   amLODIPine  (NORVASC ) 5 mg tablet Take one tablet by mouth daily.   aspirin  81 mg chewable tablet Chew one tablet by mouth daily.   biotin 1 mg cap Take one capsule by mouth daily.   busPIRone  (BUSPAR ) 10 mg tablet Take one tablet by mouth twice daily.   calcium  carbonate (OS-CAL) 1250 mg (500 mg elemental calcium ) tablet Take one tablet by mouth daily.   CHOLEcalciferoL (vitamin D3) 5000 unit tablet Take one tablet by mouth daily.   clobetasoL  (TEMOVATE ) 0.05 % topical ointment Apply  topically to affected area twice daily. Apply to areas of rash on hands twice daily; do NOT use on face/groin/underarms, use up to 2 weeks per month   diclofenac  sodium (VOLTAREN ) 1 % topical gel Apply four g topically to affected area daily as needed.   ezetimibe  (ZETIA ) 10 mg tablet Take one tablet by mouth daily.   ferrous sulfate (SLOW RELEASE IRON ) 142 mg (45 mg iron ) ER tablet Take one tablet by mouth daily.   Fish,Bora,Flax Oils-OM3,6,9 #1 (TRIPLE OMEGA) 400-400-400 mg capsule Take one capsule by mouth at bedtime daily.   inclisiran (LEQVIO ) 284 mg/1.5 mL injection syringe Inject 1.5 mL under the skin every 180 days. Initially then at 3 months and then every 6 months   metoprolol  succinate XL (TOPROL  XL) 50 mg extended release tablet Take one tablet by mouth at bedtime daily.   Miscellaneous Medical Supply misc DISCONTINUE CPAP MACHINE  AND SUPPLIES, PLEASE REACH OUT TO PATIENT TO HAVE THEM RETURN IT.   Miscellaneous Medical Supply misc DME requirements, rx to include the following typed information:    NPI: [id number]  Doctor's typed name: [name], MD  ICD 10: G47.33  Length of need: 99 months  Start date:02/14/2023  Signature date: 02/14/2023  Detailed equipment: CPAP machine with any accessories.   naltrexone  4.5 MG oral capsule (COMPOUND) Take one capsule by mouth daily.   other medication Take one Dose by mouth daily. K Complete K1 & K2 as MK-4 & MK-7 : Take 1 softgel by mouth once daily   Selenium 100 mcg tab Take one tablet by mouth daily.   SUMAtriptan  succinate (IMITREX ) 25 mg tablet Take one tablet by mouth at onset of headache. May repeat after 2 hours if needed. Max of 200 mg in 24 hours.   SYNTHROID  100 mcg tablet Take one tablet by mouth daily 30 minutes before breakfast.   tiZANidine (ZANAFLEX) 2 mg tablet Take one tablet by mouth twice daily as needed.   tretinoin  (RETIN-A ) 0.025 % topical cream Apply  topically to affected area at bedtime daily.   vit A/vit C/vit E/zinc/copper (PRESERVISION AREDS PO) Take 1 tablet by mouth daily.   vitamins, B complex tab Take one tablet by mouth daily.   vitamins, multiple cap Take one capsule by mouth daily.       Review of Systems/Medical History      Patient summary reviewed  Pertinent labs reviewed    PONV Screening: Non-smoker and Female sex    No history of anesthetic complications    No family history of anesthetic complications      Airway         TMJ (mild, denies locking)      Pulmonary             Obstructive Sleep Apnea          Interventions: CPAP; compliant      Pulmonary fibrosis         Cardiovascular         Exercise tolerance: >4 METS      Hypertension          Valvular problems/murmurs (Mild mitral and aortic regurgitation):          Coronary artery disease (Moderate stenosis of mid LAD)      Palpitations (chronic, at baseline)        Hyperlipidemia      No orthopnea      No indications/hx of PND      No dyspnea on exertion      No syncope      ASA 81mg /day   Pt follows with cardiology at [place], last OV 02/2023   Episode of CP 12/15- pt was evaluated in hospital and symptoms deemed likely 2/2 anxiety       Aortic aneurysm (Moderately dilated ascending aorta at 4.2 cm)      GI/Hepatic/Renal       Inflammatory bowel disease: dysphagia.      Hiatal hernia        GERD (s/p fundoplication; current dysphagia), well controlled      Peptic ulcer disease            Urinary incontinence      Neuro/Psych         Headaches (migraines)      Neuropathy (bilateral fingers, intermittent right toes)        Psychiatric history  Depression          Anxiety      Musculoskeletal         Back pain (Lumbosacral radiculopathy s/p ablations/epidurals) Arthritis:  osteo        Endocrine/Other           Hypothyroidism (acquired)        Autoimmune disease (fibromyalgia)      Malignancy (Left breast cancer s/p bilateral mastectomies 2022 and XRT. Thyroid cancer s/p thyroidectomy):    treated      Constitution - negative       Physical Exam    Airway Findings      Mallampati: II      TM distance: >3 FB      Neck ROM: full      Mouth opening: good      Upper Lip Bite Test: 1    Cardiovascular Findings:       Rhythm: regular      Rate: normal      No murmur, no carotid bruit, no peripheral edema    Pulmonary Findings:       Breath sounds clear to auscultation.    Abdominal Findings:       Not obese    Neurological Findings:       Alert and oriented x 3    Constitutional findings:       No acute distress       Previous Airway Procedure Notes Displaying the 3 most recent records      Date Mask Difficulty Techninque used for succesful ETT placement Blade Type Blade Size View with successful placement Number of attempts    06/15/23 0 - not attempted direct laryngoscopy Macintosh 3 grade I - full view of glottis 1            Diagnostic Tests  Hematology:   Lab Results   Component Value Date    HGB 12.5 07/01/2023    HCT 38.9 07/01/2023    PLTCT 243 07/01/2023    WBC 7.10 07/01/2023    NEUT 58 06/14/2023    ANC 3.60 06/14/2023    LYMPH 11 (L) 04/20/2021    ALC 1.50 06/14/2023    MONA 13 (H) 06/14/2023    AMC 0.80 06/14/2023    EOSA 4 06/14/2023    ABC 0.00 06/14/2023    BASOPHILS 2 04/20/2021    MCV 95.5 07/01/2023    MCH 30.8 07/01/2023    MCHC 32.2 07/01/2023    MPV 9.1 07/01/2023    RDW 13.3 07/01/2023         General Chemistry:   Lab Results   Component Value Date    NA 137 06/16/2023    K 4.3 06/16/2023    CL 100 06/16/2023    CO2 28 06/16/2023    GAP 9 06/16/2023    BUN 7 06/16/2023    CR 0.69 06/16/2023    GLU 100 06/16/2023    CA 9.2 06/16/2023    ALBUMIN 4.7 06/03/2023    MG 2.0 05/23/2023    TOTBILI 0.5 06/03/2023    PO4 3.0 03/04/2023      Coagulation:   Lab Results   Component Value Date    PT 12.3 07/17/2022    INR 1.1 07/17/2022     EKG 05/08/2023: NSR, minimal voltage criteria for LVH.      Echo 06/2022:   Small sized left ventricle.  Moderately increased LV wall thickness, consistent with concentric remodeling.  Isolated prominent basal  septal hypertrophy.   Normal left ventricular systolic function with estimated ejection fraction of 55% by biplane Simpson's method. No regional wall motion abnormalities.  Mild global longitudinal strain at -22%  Normal diastolic function with normal estimated left atrial pressure.  Normal sized right ventricle. Normal right ventricular systolic function.  Normal sized left atrium. Normal sized right atrium..   Mild mitral and aortic regurgitation  Moderately dilated ascending aorta at 4.2 cm.  No evidence of pulmonary hypertension with estimated PA systolic pressure of 24 mmHg  No pericardial effusion.    Cardiac cath 06/2021:   Moderate stenosis of mid LAD, which is a dual LAD system with 2 unequal branches that reached the apex of the left ventricle. The larger of the 2 branches was lumpy bumpy throughout the mid segment and may be mildly ectatic. The left circumflex is large and dominant and was disease free.   PAC Plan    Interview: Clinic Interview    ASA Score: 3    Anesthesia Options Discussed: General              Labs/Studies Ordered Prior to DOS: (1/7 and CK per PCP)  CMP          PAC RISK ASSESSMENTS (05/26/23):   *Duke Activity Status Index (DASI): 24.2, Calculated METS: 5.72 (score < 35 correlates with increased risk for death, MI, and moderate to severe complications, max score 58)  *Revised Cardiac Risk Index (RCRI): 1 points=0.9% (CAD) risk for major adverse cardiac events (myocardial infarction, pulmonary edema, cardiac arrest)          Alerts    Anesthesia Plan    ASA score: 3   Plan: MAC  NPO status: acceptable      Informed Consent  Anesthetic plan and risks discussed with patient.  Use of blood products discussed with patient  Blood Consent: consented      Plan discussed with: CRNA, surgeon/proceduralist and anesthesiologist.  Comments: (I discussed the risks/benefits of proceeding with Monitored Anesthesia Care including: 1)possible awareness during the procedure 2) change of anesthetic plan and conversion to a general anesthetic 3)the need to be awakened during key portions of the procedure by the surgeon.  The patient expressed understanding and will proceed with Monitored Anesthesia Care.  )

## 2023-11-24 ENCOUNTER — Emergency Department: Admit: 2023-11-24 | Discharge: 2023-11-25 | Disposition: A | Discharge status: Home / Self Care | Payer: MEDICARE

## 2023-11-24 ENCOUNTER — Encounter: Admit: 2023-11-24 | Discharge: 2023-11-24 | Payer: MEDICARE

## 2023-11-24 ENCOUNTER — Ambulatory Visit: Admit: 2023-11-24 | Discharge: 2023-11-25 | Payer: MEDICARE

## 2023-11-24 ENCOUNTER — Emergency Department: Admit: 2023-11-24 | Discharge: 2023-11-24 | Payer: MEDICARE

## 2023-11-24 MED ORDER — CEPHALEXIN 500 MG PO CAP
500 mg | ORAL_CAPSULE | Freq: Four times a day (QID) | ORAL | 0 refills | 7.00000 days | Status: AC
Start: 2023-11-24 — End: ?

## 2023-11-25 ENCOUNTER — Encounter: Admit: 2023-11-25 | Discharge: 2023-11-25 | Payer: MEDICARE

## 2023-11-28 ENCOUNTER — Encounter: Admit: 2023-11-28 | Discharge: 2023-11-28 | Payer: MEDICARE

## 2023-11-28 NOTE — Telephone Encounter
 ED Discharge Follow Up  Reached patient: Yes, patient was identified, for their safety, using dual identification of name and date of birth  Patient Date of Birth: Jul 30, 1960  Admission Information  Hospital Name : [facility]  ED Admission Date: 11/23/23   ED Discharge Date: 11/23/23   Admission Diagnosis: Bladder infection   Discharge Diagnosis: Pyelocystitis, Thoracic disc herniation  Hospital Services: Unplanned  Today's call is 5 (calendar) days post discharge    Discharge Instruction Review  Did patient receive and understand discharge instructions? Yes  Are there concerns regarding the patient?s ADL?s ? No    Medication Reconciliation  Changes to pre-ED visit medications? No    **Cindy Randolph was prescribed Keflex  by her urogyn doctor, which she has been taking since discharge from the ED.    Were new prescriptions filled? N/A  Meds reviewed and reconciled? Yes    Current Outpatient Medications   Medication Instructions    acetaminophen  (TYLENOL ) 650 mg, Oral, EVERY  6 HOURS, Take scheduled for 3 days after surgery, then as needed. Do not exceed 4,000mg  in a 24 hour period.    amLODIPine  (NORVASC ) 5 mg, Oral, DAILY    aspirin  81 mg, Oral, DAILY    biotin 1 mg, DAILY    busPIRone  (BUSPAR ) 10 mg, Oral, TWICE DAILY    calcium  carbonate (OS-CAL) 1,250 mg, DAILY    cephalexin  (KEFLEX ) 500 mg, Oral, FOUR TIMES DAILY    CHOLEcalciferoL (vitamin D3) 5000 unit tablet 1 tablet, DAILY    clobetasoL  (TEMOVATE ) 0.05 % topical ointment Topical, TWICE DAILY, Apply to areas of rash on hands twice daily; do NOT use on face/groin/underarms, use up to 2 weeks per month    diclofenac  sodium (VOLTAREN ) 4 g, DAILY  PRN    ezetimibe  (ZETIA ) 10 mg, Oral, DAILY    ferrous sulfate (SLOW RELEASE IRON ) 142 mg (45 mg iron ) ER tablet 1 tablet, Oral, DAILY    Fish,Bora,Flax Oils-OM3,6,9 #1 (TRIPLE OMEGA) 400-400-400 mg capsule 1 capsule, AT BEDTIME DAILY    LEQVIO  284 mg, Subcutaneous, EVERY 180 DAYS (6 MONTHS), Initially then at 3 months and then every 6 months    metoprolol  succinate XL (TOPROL  XL) 50 mg, Oral, AT BEDTIME DAILY    Miscellaneous Medical Supply misc DME requirements, rx to include the following typed information:    NPI: [id number]  Doctor's typed name: [name], MD  ICD 10: G47.33  Length of need: 99 months  Start date:02/14/2023  Signature date: 02/14/2023  Detailed equipment: CPAP machine with any accessories.    Miscellaneous Medical Supply misc DISCONTINUE CPAP MACHINE AND SUPPLIES, PLEASE REACH OUT TO PATIENT TO HAVE THEM RETURN IT.    naltrexone  4.5 MG oral capsule (COMPOUND) 0.0045 g, Oral, DAILY    other medication 1 Dose, DAILY    Selenium 100 mcg tab 1 tablet, DAILY    SUMAtriptan  succinate (IMITREX ) 25 mg tablet Take one tablet by mouth at onset of headache. May repeat after 2 hours if needed. Max of 200 mg in 24 hours.    SYNTHROID  100 mcg, Oral, DAILY 30MIN BEFORE BREAKFAST    tiZANidine (ZANAFLEX) 2 mg tablet Take one tablet by mouth twice daily as needed.    tretinoin  (RETIN-A ) 0.025 % topical cream Topical, AT BEDTIME DAILY    vit A/vit C/vit E/zinc/copper (PRESERVISION AREDS PO) 1 tablet, DAILY    vitamins, B complex tab 1 tablet, DAILY    vitamins, multiple cap 1 capsule, DAILY  Understanding Condition  Having any current symptoms? No-pain is better, denies fevers  Do you have a history of Heart Failure? No  Patient understands when to seek additional medical care? Yes  Is patient aware of [facility] locations? Yes  Urgent Care appropriate for this diagnosis? No  Other items discussed:           Scheduling Follow-up Appointment  Upcoming appointments:   Future Appointments   Date Time Provider Department Center   11/28/2023  9:15 AM SPRT - ICD2 ROOM 1 [facility]   11/28/2023  9:20 AM Vopat, Dorise MATSU, MD ICDSMC [facility]   11/29/2023  2:00 PM Huggins, Melissa E, MD MPUROGYN OB/GYN   12/02/2023  7:45 AM INF08 [facility] None   12/06/2023  2:00 PM Susa Orlean LABOR, APRN-NP CVMCLOP CVM Exam   01/18/2024 10:40 AM Genoveva Ruben SQUIBB, MD OCAR[facility]   01/30/2024  9:40 AM Ledora Catalina, MD QVAGASTR IM   02/09/2024 10:00 AM Mardy Elida NOVAK, APRN-NP UK[facility]OPRADTH [facility]   02/10/2024  9:00 AM Sherrod Chandler, Sherrod Glass, MD MPGENMED IM   03/13/2024  1:30 PM Zulema Gerard HERO, PA-C QVIMDBTS IM   03/27/2024  3:30 PM Fayette Carlin NOVAK, MD CVMCLOP CVM Exam   04/18/2024 10:15 AM CT - [facility] Rad   04/23/2024  1:40 PM Isidoro Tinnie BRAVO, MD CCC2 [place] Exam   04/30/2024  8:45 AM Boris Bard LABOR, MD ICE3DERM IM     When was patient?s last PCP visit: 11/01/2023  PCP primary location: [facility]  PCP appointment scheduled? Yes, Date: 02/10/24 -Will give this RN a call if appointment is needed.  Specialist appointment scheduled? Yes, with Urogyn 11/29/23  Is assistance with transportation needed? No  MyChart message sent? Active in MyChart. No message sent.   Artera text sent? No      ED Communication   Did patient call clinic prior to going to ED? Yes Referred to ED by clinic/provider  Reason patient went to ED: Referred by clinic/provider    Inoc[facility], RN

## 2023-11-28 NOTE — Progress Notes
 General Risk Score 8  HCC Score 0.428  Program criteria met -HTN CP list, High GRS score  Problem List (Diagnosis)HTN    Patient was identified using dual identification of name and date of birth with repeat back    Patient Reports:    1.Spoke with patient for follow up after ED discharge from having a bladder infection. She has not been doing daily BP monitoring since she has been dealing with other health issues such as bladder infection and back pain.      Treatment Goals and Expected Outcomes (patient centered)    1.Back to BP monitoring  2.Recovering from recent bladder/UTI--on antibiotics  3.Get MRI for back pain    Barriers to Care:    1.Back pain  2.High GRS score  3.Complex healthcare issues    Education:    1.Reminder to do daily BP monitoring, with her back pain and recent ED visit with a bladder infection BP monitoring daily has not been in the fore front of her mind    Care Plan:    1.Begin BP monitoring  2.Figure out her back pain  3.Continue to support and provide any resources needed  4. Instructed patient to call care manager any time with questions or concerns.   5. Routing note to PCP    LOV:  11/01/23      Upcoming appointments include:    Future Appointments   Date Time Provider Department Center   11/29/2023  2:00 PM Huggins, Melissa E, MD MPUROGYN OB/GYN   12/01/2023  8:30 AM Dow Ozell BROCKS, MD [facility]   12/02/2023  7:45 AM INF08 [facility] None   12/06/2023  2:00 PM Verbenec, Orlean LABOR, APRN-NP CVMCLOP CVM Exam   01/18/2024 10:40 AM Genoveva Ruben SQUIBB, MD OCARHEUM [facility]   01/30/2024  9:40 AM Ledora Catalina, MD QVAGASTR IM   02/09/2024 10:00 AM Mardy Elida NOVAK, APRN-NP [facility]OPRADTH [facility]   02/10/2024  9:00 AM Sherrod Chandler, Sherrod Glass, MD MPGENMED IM   03/13/2024  1:30 PM Zulema Gerard HERO, PA-C QVIMDBTS IM   03/27/2024  3:30 PM Fayette Carlin NOVAK, MD CVMCLOP CVM Exam   04/18/2024 10:15 AM CT - [facility] Rad   04/23/2024  1:40 PM Isidoro Tinnie BRAVO, MD CCC2 [place] Exam   04/30/2024 8:45 AM Boris, Bard LABOR, MD ICE3DERM IM     Next review jamee of plan (approximate):  12/29/23/PRN

## 2023-11-29 ENCOUNTER — Ambulatory Visit: Admit: 2023-11-29 | Discharge: 2023-11-30 | Payer: MEDICARE

## 2023-11-29 ENCOUNTER — Encounter: Admit: 2023-11-29 | Discharge: 2023-11-29 | Payer: MEDICARE

## 2023-11-29 DIAGNOSIS — N3946 Mixed incontinence: Secondary | ICD-10-CM

## 2023-11-29 NOTE — Progress Notes
 CONSULTATION:    CC: Cindy Randolph      Date of Service: 11/29/2023         History of Present Illness  Cindy Randolph is a 63 y.o. female with urinary incontinence.    Pt had UTI 11/24/23  + E. coli.  Treated in ED with discharged home with Keflex  500mg  1 PO QID x 7 days.  still on treatment.  Cancelled her Urodynamics test today, but wanted to review results to date.    She has stress incontinence. Leakage occurs with coughing, sneezing, and urgency, and she often wakes up with wet underpants. Bulkamed treatment initially resolved nighttime leakage, but symptoms have returned. She experiences urgency and leakage upon waking and incomplete bladder emptying with post-void dribbling.    She previously took estrogen receptor-positive drugs for breast cancer, which she believes may have contributed to her urinary symptoms, including increased susceptibility to UTIs and urgency.         _____________________________________________________________________________  REVIEWED RECORDS - UDS 04/25/23  _____________________________________________________________________________  UDS with Cindy Randolph 04/25/23  #1 UROFLOW: Void 269 mL  (PVR? 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  P: Bulkamid performed 05/03/2023.  Reports having a good result for several months.     It was thought that she could benefit from additional Bulkamid as it does not seem to be working as well as it initially did. She had this done by Cindy Randolph in the fall of 2024 .                  Objective:         acetaminophen  (TYLENOL ) 325 mg tablet Take two tablets by mouth every 6 hours. Take scheduled for 3 days after surgery, then as needed. Do not exceed 4,000mg  in a 24 hour period.    amLODIPine  (NORVASC ) 5 mg tablet Take one tablet by mouth daily.    aspirin  81 mg chewable tablet Chew one tablet by mouth daily.    biotin 1 mg cap Take one capsule by mouth daily.    busPIRone  (BUSPAR ) 10 mg tablet Take one tablet by mouth twice daily.    calcium  carbonate (OS-CAL) 1250 mg (500 mg elemental calcium ) tablet Take one tablet by mouth daily.    cephalexin  (KEFLEX ) 500 mg capsule Take one capsule by mouth four times daily for 7 days. Indications: genitourinary tract infections    CHOLEcalciferoL (vitamin D3) 5000 unit tablet Take one tablet by mouth daily.    diclofenac  sodium (VOLTAREN ) 1 % topical gel Apply four g topically to affected area daily as needed.    ezetimibe  (ZETIA ) 10 mg tablet Take one tablet by mouth daily.    Fish,Bora,Flax Oils-OM3,6,9 #1 (TRIPLE OMEGA) 400-400-400 mg capsule Take one capsule by mouth at bedtime daily.    inclisiran (LEQVIO ) 284 mg/1.5 mL injection syringe Inject 1.5 mL under the skin every 180 days. Initially then at 3 months and then every 6 months    metoprolol  succinate XL (TOPROL  XL) 50 mg extended release tablet Take one tablet by mouth  at bedtime daily.    Miscellaneous Medical Supply misc DME requirements, rx to include the following typed information:    NPI: [id number]  Doctor's typed name: Cindy Laraine Cindy Stephanie, MD  ICD 10: G47.33  Length of need: 99 months  Start date:02/14/2023  Signature date: 02/14/2023  Detailed equipment: CPAP machine with any accessories.    naltrexone  4.5 MG oral capsule (COMPOUND) Take one capsule by mouth daily.    other medication Take one Dose by mouth daily. K Complete K1 & K2 as MK-4 & MK-7 : Take 1 softgel by mouth once daily    Selenium 100 mcg tab Take one tablet by mouth daily.    SUMAtriptan  succinate (IMITREX ) 25 mg tablet Take one tablet by mouth at onset of headache. May repeat after 2 hours if needed. Max of 200 mg in 24 hours.    SYNTHROID  100 mcg tablet Take one tablet by mouth daily 30 minutes before breakfast.    tiZANidine (ZANAFLEX) 2 mg tablet Take one tablet by mouth twice daily as needed.    tretinoin  (RETIN-A ) 0.025 % topical cream Apply  topically to affected area at bedtime daily.    vit A/vit C/vit E/zinc/copper (PRESERVISION AREDS PO) Take 1 tablet by mouth daily.    vitamins, B complex tab Take one tablet by mouth daily.    vitamins, multiple cap Take one capsule by mouth daily.     Vitals:    11/29/23 1407   BP: 106/68   BP Source: Arm, Right Upper   Pulse: 80   Temp: 98.1 ?F (36.7 ?C)   Resp: 18   SpO2: 94%   TempSrc: Temporal   PainSc: Five   Weight: 69 kg (152 lb 3.2 oz)   Height: 158.8 cm (5' 2.5)     Body mass index is 27.39 kg/m?Cindy Randolph     Physical Exam         Assessment and Plan:  IMPRESSION/PLAN:  Cindy Randolph is a 64 yo s/p ER+ breast cancer treatment               - Unable to tolerate prolonged treatment ER blocking meds  SUI sx improved significantly s/p Bulkamid until episode of gross hematuria  Urinary urgency incontinence and incontinence in the absence of sensation primary sx now since episode of gross hematuria  Episode of gross hematuria with negative urine culture  Pelvic floor dysfunction hx - has seen 2 PFPT with good success  Reports occasional vaginitis fishy odor     Follow-up urine culture  Follow-Oneswab  Cystourethroscopy - evaluate Bulkamid urethra lumen and for chronic UTI  Urodynamic studies  Consultation        Review of Urodynamics PRIOR TO BULKAMID:   Adequate capacity and no urinary retention   + SUI with low pressure urethra   No DO (detrusor overactivity = bladder contractions associated with urgency incontinence)    Hx of SUI with low pressure urethra  Bulkamid previously effective but effects diminished.  Cystoscopy showed incomplete coaptation of proximal urethra    Chronic cystitis mild   localized to trigone  no infection markers    Overactive bladder with urgency and nocturnal leakage sx.   +Trabeculations with distended gutters  Cystoscopy showed trabeculated bladder muscle and possible diverticulum.   - Await results of upcoming Urodynamics  - Consider treatment options based on test results.  - Discuss potential repeat Bulkamid if stress incontinence is confirmed.  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  Treatment Options: Expectant management                     Behavior modification        limit caffeine       timed voids                  Kegals/Pelvic Floor PT                 OAB medications       Oxybutynin/ Ditropan, Oxytrol, Gelnique       Detrol/Tolterodine,  Toviaz        Vesicare/Solifinacin       Enablex/Darifenacin        Santura/Tropsium Chloride        Myrbetriq      _____________________________________       Chemodenervation                                                                  Injection of Botox into the bladder wall                                                      Neuromodulation                                                                  Posterior tibial nerve stimulation (PTNS)                                                                 Implant bladder pacemaker (Interstim)  ______________________________________________________________________  SUI  Treatment Options: Expectant management     Kegals / Pelvic floor PT     Pessary trial     Surgery (Gold Standard): Sling (polypropylene mesh vs fascia sling)               Procedure (lasts upto 7 years): Bulkamid                 40 minutes were spent on this patient's encounter with counseling and coordination of care taking >50% of the visit.

## 2023-11-30 DIAGNOSIS — R3989 Other symptoms and signs involving the genitourinary system: Principal | ICD-10-CM

## 2023-12-01 ENCOUNTER — Ambulatory Visit: Admit: 2023-12-01 | Discharge: 2023-12-01 | Payer: MEDICARE

## 2023-12-01 ENCOUNTER — Encounter: Admit: 2023-12-01 | Discharge: 2023-12-01 | Payer: MEDICARE

## 2023-12-01 DIAGNOSIS — M5414 Radiculopathy, thoracic region: Secondary | ICD-10-CM

## 2023-12-01 DIAGNOSIS — M5416 Radiculopathy, lumbar region: Principal | ICD-10-CM

## 2023-12-01 NOTE — Progress Notes
 SPINE CLINIC NOTE       SUBJECTIVE:       Cindy Randolph is 63 year old female past medical history of breast cancer, coronary artery disease, hypertension, GERD, fibromyalgia, hypothyroidism, hyperlipidemia who returns to our clinic.  She was last seen on 11/03/2023 where she received a L4-L5 LESI.  She mentions that injection completely relieved her pain and she is satisfied with results.  However on this visit she would like to address a new complaint which is thoracic pain.  She says that she has been having thoracic pain for some time now and does not attribute the pain to any inciting event.  She mentions that she feels a pain on her lower thoracic area and can sometimes radiate around and to the front of her rib cage in a ring-like distribution.  She mentions that during her last epidural steroid injection, she felt a pain around the T7 area in a ring-like distribution when she was lying on the procedure table.  She also visited the emergency room on 11/24/2023 for dysuria and was found to have a UTI for which she is currently receiving antibiotic treatment.  While she was in the ED, she had CT of the abdomen/pelvis and was told that she had a coincidental T10-T11 mild central canal stenosis.  She is here to discuss these results and see if the imaging result is associated with her thoracic back pain.  Further she stipulates that she has been having episodes of saddle anesthesia and will feel weakness in bilateral lower extremities at the end of the day.  She is concerned of the symptoms and mentions that she would like to speak to a spine surgeon.  In 2021 she saw Dr. Alyce Mace who recommended a fusion of an L3-L4 and L4-L5.         No annotated images are attached to the encounter.    Review of Systems    Current Outpatient Medications:     acetaminophen  (TYLENOL ) 325 mg tablet, Take two tablets by mouth every 6 hours. Take scheduled for 3 days after surgery, then as needed. Do not exceed 4,000mg  in a 24 hour period., Disp: 40 tablet, Rfl: 0    amLODIPine  (NORVASC ) 5 mg tablet, Take one tablet by mouth daily., Disp: 90 tablet, Rfl: 3    aspirin  81 mg chewable tablet, Chew one tablet by mouth daily., Disp: 90 tablet, Rfl: 0    biotin 1 mg cap, Take one capsule by mouth daily., Disp: , Rfl:     busPIRone  (BUSPAR ) 10 mg tablet, Take one tablet by mouth twice daily., Disp: 180 tablet, Rfl: 1    calcium  carbonate (OS-CAL) 1250 mg (500 mg elemental calcium ) tablet, Take one tablet by mouth daily., Disp: , Rfl:     cephalexin  (KEFLEX ) 500 mg capsule, Take one capsule by mouth four times daily for 7 days. Indications: genitourinary tract infections, Disp: 28 capsule, Rfl: 0    CHOLEcalciferoL (vitamin D3) 5000 unit tablet, Take one tablet by mouth daily., Disp: , Rfl:     diclofenac  sodium (VOLTAREN ) 1 % topical gel, Apply four g topically to affected area daily as needed., Disp: , Rfl:     ezetimibe  (ZETIA ) 10 mg tablet, Take one tablet by mouth daily., Disp: 90 tablet, Rfl: 3    Fish,Bora,Flax Oils-OM3,6,9 #1 (TRIPLE OMEGA) 400-400-400 mg capsule, Take one capsule by mouth at bedtime daily., Disp: , Rfl:     inclisiran (LEQVIO ) 284 mg/1.5 mL injection syringe, Inject 1.5 mL  under the skin every 180 days. Initially then at 3 months and then every 6 months, Disp: 1 mL, Rfl: 1    metoprolol  succinate XL (TOPROL  XL) 50 mg extended release tablet, Take one tablet by mouth at bedtime daily., Disp: 90 tablet, Rfl: 3    Miscellaneous Medical Supply misc, DME requirements, rx to include the following typed information:  NPI: [id number] Doctor's typed name: [name], MD ICD 10: G47.33 Length of need: 99 months Start date:02/14/2023 Signature date: 02/14/2023 Detailed equipment: CPAP machine with any accessories., Disp: 1 each, Rfl: 0    naltrexone  4.5 MG oral capsule (COMPOUND), Take one capsule by mouth daily., Disp: 90 capsule, Rfl: 1    other medication, Take one Dose by mouth daily. K Complete K1 & K2 as MK-4 & MK-7 : Take 1 softgel by mouth once daily, Disp: , Rfl:     Selenium 100 mcg tab, Take one tablet by mouth daily., Disp: , Rfl:     SUMAtriptan  succinate (IMITREX ) 25 mg tablet, Take one tablet by mouth at onset of headache. May repeat after 2 hours if needed. Max of 200 mg in 24 hours., Disp: 9 tablet, Rfl: 0    SYNTHROID  100 mcg tablet, Take one tablet by mouth daily 30 minutes before breakfast., Disp: 90 tablet, Rfl: 1    tiZANidine (ZANAFLEX) 2 mg tablet, Take one tablet by mouth twice daily as needed., Disp: , Rfl:     tretinoin  (RETIN-A ) 0.025 % topical cream, Apply  topically to affected area at bedtime daily., Disp: 45 g, Rfl: 11    vit A/vit C/vit E/zinc/copper (PRESERVISION AREDS PO), Take 1 tablet by mouth daily., Disp: , Rfl:     vitamins, B complex tab, Take one tablet by mouth daily., Disp: , Rfl:     vitamins, multiple cap, Take one capsule by mouth daily., Disp: , Rfl:   Allergies   Allergen Reactions    Mango ANAPHYLAXIS    Other [Unclassified Drug] ANAPHYLAXIS     DUCK Meat and Eggs     Imdur  [Isosorbide  Mononitrate] CHEST TIGHTNESS and RASH    Iodinated Contrast Media RASH and ITCHING     04/2023 pt developed throat itching after IV Contrast in CT, itching subsided after 5 minutes without intervention.   She developed rash on back and neck after hospitalization 06/2022 where she underwent heart cath. Unsure if contrast was   cause of rash, but it developed shortly after procedure.    Macrobid  [Nitrofurantoin  Monohyd/M-Cryst] SEE COMMENTS     Patient has pulmonary fibrosis so this antibiotic is not preferred unless this is the only medication available .     Oxycodone  NAUSEA ONLY     Prefers tramadol     Sudafed [Pseudoephedrine Hcl] PALPITATIONS     Physical Exam  Vitals:    12/01/23 0827   BP: 131/87   BP Source: Arm, Right Upper   Pulse: 71   SpO2: 98%   PainSc: Five   Weight: 73.2 kg (161 lb 6.4 oz)   Height: 157.5 cm (5' 2)     Oswestry Total Score:: (Patient-Rptd) 52  Pain Score: Five  Body mass index is 29.52 kg/m?.      Gen: Alert & Oriented X 3  HEENT: EOMI  Neck: Supple, no elevated JVP  Heart: Extremities well perfused  Lungs: non labored breathing  Abdomen: Soft, non-tender, non-distended  Skin: no gross lesions appreciated  Ext: purposeful movement of extremities   Psychiatric:  Appropriate mood and affect  Musculoskeletal: No atrophy.   Neurologic: Antigravity strength in all extremities. CN II -XII grossly intact.  Alert and oriented x 3.     -Neck-    UPPER EXTREMITIES  MS:   Muscle Strength Test Root Right Left   Shoulder Abduction C5 5 5   Elbow Flexion C5 5 5   Elbow Extension C7 5 5   Wrist Extension C6 5 5   Finger Flexion C8 5 5   Finger Abduction T1 5 5   Sensation: Sensation to crude touch slightly reduced on the right C6 dermatome; otherwise intact to crude to on C5-T2 dermatomes    DTR's 2 in bilateral biceps, triceps, brachioradialis   Upper Extremity Tone Normal       Inspection: Normal neck posture, cervical lordosis preserved. No signs atrophy or scoliosis.  Palpation: Non tender to palpation.  ROM: Intact with flexion, extension, side bending and rotation.    Special maneuvers:  - Spurling's (negative) bilaterally  - Facet loading (positive) on right side        -LOWER  EXTREMITIES-  MS:   Muscle Strength Test Root Right Left   Thigh Flexion L2 5 5   Knee Extension L3 5 5   Ankle Dorsiflexion L4 5 5   Great Toe Extension L5 5 5   Plantar Flexion S1 5 5   Knee Flexion S2 5 5   Sensation: L2-S2 dermatomes intact to light touch    DTR's 2 right patella, 2 left patella  2 right achilles, 2 left achilles       MSK  -Randolph Back-  Inspection: No sign of muscular atrophy or scoliosis  Palpation: Non-tender to palpation over spinous process, facets, para spinal musculature, or PSIS  Special maneuver:  - Facet loading (negative) bilaterally  - Straight leg raise (negative) bilaterally          DIAGNOSTICS  CT abdomen/pelvis from 11/24/2023 was personally reviewed by me.  There is evidence of a T10-T11 posterior central disc herniation.    CT ABD/PELV WO CONTRAST  Narrative: CT ABDOMEN AND PELVIS    Clinical Indication:  Right flank pain    Technique:  Multiple contiguous axial images were obtained through the abdomen and pelvis following the administration of IV contrast material. Post processing coronal and sagittal reconstruction images were made from the axial images.     IV contrast: Yes  Bowel contrast:  None    Comparison: CT abdomen and pelvis dated 09/20/2023. Chest CT dated 12/31/2022.    FINDINGS:    Lower Thorax: Stable subpleural nodule in the lateral aspect of the left lower lobe, likely benign. Minimal linear scarring/atelectasis in both lung bases. Tiny sliding hiatal hernia. No pleural effusion.    Liver and Biliary system: Liver is normal in size. Right renal cysts. Nondilated gallbladder, without radiopaque gallstones.     Spleen: Unremarkable.    Adrenal Glands and Kidneys: Adrenal glands are unremarkable. No hydronephrosis or nephrolithiasis.    Pancreas and Retroperitoneum: Unopacified pancreas is grossly unremarkable. No retroperitoneal lymphadenopathy or fluid collection.    Aorta and Major Vessels: Normal caliber abdominal aorta, with mild calcified atherosclerotic plaque.     Bowel, Mesentery, and Peritoneal space: GI tract is normal caliber. No ascites or pneumoperitoneum. Normal appendix.    Pelvis: Mildly distended urinary bladder is grossly unremarkable. Atrophic uterus. No pelvic lymphadenopathy.    Abdominal Wall and Osseous Structures: 6 nonrib-bearing lumbar vertebral bodies. Lumbar spondylosis. Mild grade 1 anterolisthesis of L3 on L4, likely degenerative. Posterior  central disc herniation at this T10-11 resulting in at least mild central canal stenosis (series 301 image 16). Tiny fat-containing umbilical hernia.  Impression: 1. No obstructive uropathy. No bowel obstruction, ascites, or inflammatory mass lesion. Normal appendix.    2. Posterior central disc herniation at T10-11 with at least mild central canal stenosis. If important to evaluate further, nonemergent MRI imaging of the spine may be considered.     Finalized by Fairy DOROTHA Hockey, MD on 11/24/2023 5:54 PM. Dictated by Fairy DOROTHA Hockey, MD on 11/24/2023 5:47 PM.          Assessment:  The pain complaints are most likely due to:  1. Lumbar radiculopathy    2. Thoracic radiculopathy        Cindy Randolph is a 63 y.o. female who presents for evaluation of thoracic and Randolph back pain likely secondary to thoracic and lumbar radiculopathy respectively.  Mentions that her pain is recently becoming progressively worse.  She further mentions that she has saddle anesthesia and bilateral lower extremity weakness at the end of the day which are concerning for myelopathy/nerve compression.  As such it is medically necessary to obtain both thoracic and lumbar MRI.    Lifestyle Modifications:   -Recommend activity as tolerated.  Avoid provocative maneuvers. Keep spine in neutral position.  Therapies/HEP:  -Continue HEP as tolerated.   Interventions:   - None today.  Medications:   -Continue current pharmacologic regimen.   Imaging:  - It is medically necessary to obtain thoracic and lumbar spine MRI to further assess for any pathologies that are causing her presentation  Referral:  -Referral to spine surgery        Pt was seen and discussed with Dr. Dow Baller [facility] Everitt Lonn Nett, MD  Interventional Spine and Musculoskeletal Medicine Fellow  Physical Medicine & Rehabilitation Dept.    ATTESTATION    I personally performed the key portions of the E/M visit, discussed case with fellow and concur with fellow documentation of history, physical exam, assessment, and treatment plan unless otherwise noted.    Staff name:  Ozell JAYSON Dow, MD Date:  12/01/2023

## 2023-12-02 ENCOUNTER — Ambulatory Visit: Admit: 2023-12-02 | Discharge: 2023-12-02 | Payer: MEDICARE

## 2023-12-02 ENCOUNTER — Encounter: Admit: 2023-12-02 | Discharge: 2023-12-02 | Payer: MEDICARE

## 2023-12-02 MED ORDER — IRON DEXTRAN IVPB
975 mg | Freq: Once | INTRAVENOUS | 0 refills | Status: CN
Start: 2023-12-02 — End: ?

## 2023-12-02 MED ORDER — IRON DEXTRAN 50 MG/ML IV TEST DOSE
25 mg | Freq: Once | INTRAVENOUS | 0 refills | Status: CN
Start: 2023-12-02 — End: ?

## 2023-12-02 MED ORDER — ACETAMINOPHEN 500 MG PO TAB
500 mg | Freq: Once | ORAL | 0 refills | Status: CP
Start: 2023-12-02 — End: ?
  Administered 2023-12-02: 14:00:00 500 mg via ORAL

## 2023-12-02 MED ORDER — IRON DEXTRAN 50 MG/ML IV TEST DOSE
25 mg | Freq: Once | INTRAVENOUS | 0 refills | Status: CP
Start: 2023-12-02 — End: ?
  Administered 2023-12-02: 13:00:00 25 mg via INTRAVENOUS

## 2023-12-02 MED ORDER — ACETAMINOPHEN 500 MG PO TAB
500 mg | Freq: Once | ORAL | 0 refills | Status: CN
Start: 2023-12-02 — End: ?

## 2023-12-02 MED ORDER — DIPHENHYDRAMINE HCL 25 MG PO CAP
25 mg | Freq: Once | ORAL | 0 refills | Status: CP
Start: 2023-12-02 — End: ?
  Administered 2023-12-02: 14:00:00 25 mg via ORAL

## 2023-12-02 MED ORDER — DIPHENHYDRAMINE HCL 25 MG PO CAP
25 mg | Freq: Once | ORAL | 0 refills | Status: CN
Start: 2023-12-02 — End: ?

## 2023-12-02 MED ORDER — IRON DEXTRAN IVPB
975 mg | Freq: Once | INTRAVENOUS | 0 refills | Status: CP
Start: 2023-12-02 — End: ?
  Administered 2023-12-02 (×2): 975 mg via INTRAVENOUS

## 2023-12-02 NOTE — Progress Notes
 Pt tolerated Infed  test dose, 1 hour observation time and main dose of Infed  without any sign of a reaction.

## 2023-12-03 DIAGNOSIS — E611 Iron deficiency: Principal | ICD-10-CM

## 2023-12-05 ENCOUNTER — Encounter: Admit: 2023-12-05 | Discharge: 2023-12-05 | Payer: MEDICARE

## 2023-12-05 DIAGNOSIS — I10 Essential (primary) hypertension: Principal | ICD-10-CM

## 2023-12-05 NOTE — Progress Notes
 Patient has appointment with Cardiologist tomorrow, 7/15. RN will send NP [place]ebenec RPM data.    30 day BP average on RPM 130/85, HR 70.    Remote Patient Monitoring          11/30/2023 12/01/2023    05:32 12/02/2023 12/03/2023 12/04/2023 12/05/2023   BP   Systolic BP 119 mmHg  122 mmHg 136 mmHg 116 mmHg 152 mmHg   Systolic BP Pt Entered      140 mmHg   Diastolic BP 70 mmHg  68 mmHg 85 mmHg 78 mmHg 89 mmHg   Diastolic BP Pt Entered      88 mmHg   Heart Rate   Heart Rate  81 bpm   74 bpm  71 bpm  66 bpm  67 bpm   Heart Rate Pt entered      66 bpm   SPO2   SPO2 93 %  91 % 92 % 94 % 95 %   SPO2 Pt Entered      95 %   Weight   Weight  158.6 Lb 158.8 Lb 160.8 Lb 160.8 Lb    Weight Pt Entered 159.2 Lb           Details          More values are hidden. Newest values shown. Go to activity for more data.

## 2023-12-06 ENCOUNTER — Ambulatory Visit: Admit: 2023-12-06 | Discharge: 2023-12-07 | Payer: MEDICARE

## 2023-12-06 ENCOUNTER — Encounter: Admit: 2023-12-06 | Discharge: 2023-12-06 | Payer: MEDICARE

## 2023-12-06 DIAGNOSIS — Z8249 Family history of ischemic heart disease and other diseases of the circulatory system: Secondary | ICD-10-CM

## 2023-12-06 DIAGNOSIS — E7841 Elevated Lipoprotein(a): Secondary | ICD-10-CM

## 2023-12-06 DIAGNOSIS — Z789 Other specified health status: Principal | ICD-10-CM

## 2023-12-06 DIAGNOSIS — I10 Essential (primary) hypertension: Secondary | ICD-10-CM

## 2023-12-06 DIAGNOSIS — R931 Abnormal findings on diagnostic imaging of heart and coronary circulation: Secondary | ICD-10-CM

## 2023-12-06 DIAGNOSIS — I77810 Thoracic aortic ectasia: Secondary | ICD-10-CM

## 2023-12-06 DIAGNOSIS — Z131 Encounter for screening for diabetes mellitus: Secondary | ICD-10-CM

## 2023-12-06 DIAGNOSIS — E782 Mixed hyperlipidemia: Secondary | ICD-10-CM

## 2023-12-06 DIAGNOSIS — Z79899 Other long term (current) drug therapy: Secondary | ICD-10-CM

## 2023-12-06 NOTE — Patient Instructions
 Cindy Randolph  would like for you to have fasting labs drawn soon. If you have your labs drawn at a [facility] facility, you do not need to take orders with you. If you would like to have your labs drawn at an outside facility, you will need hard copies of your lab orders or we can fax them to the lab for you.     FOLLOW UP APPOINTMENT:     Cindy Randolph would like to see you back in 6 months. Please call [phone] to cancel or reschedule your visit.     In order to provide you the best care possible we ask that you follow up as below:    For questions: please call Cindy Randolph [facility] nursing line at [phone] Monday - Friday 8-5 only.         Please leave a detailed message with your name, date of birth, and reason for your call.  A nurse will return your call as soon as possible.     For all medication refills: please contact your pharmacy.      Scheduling: Please call [phone]    It was truly our pleasure seeing you today. Thank you for choosing [facility] for your heart health!    Instructions given by Jaleeya Mcnelly, RN

## 2023-12-06 NOTE — Progress Notes
 Date of Service: 12/06/2023    Haliey Romberg is a 63 y.o. female.       HPI     She is a pleasant 63 year old female being seen for risk factor reduction.  She is followed by Dr. Fayette.  She has a history of breast cancer status post bilateral mastectomy, elevated CT calcium  score, family history of premature coronary artery disease, elevated LP(a), hypertension and dyslipidemia.  She has not tolerated rosuvastatin  even in very low doses she had significant myalgias and elevated CK level.    She is tolerating Zetia .  LDL remained above goal.     She had recent iron  transfusion and reports she is not feeling very well today.  Exercise has declined.  She is doing some gardening.     She has not had any anginal symptoms.  She denies orthopnea or PND.     Blood pressure has been optimally controlled.  She is on amlodipine  and metoprolol .  She is monitoring at home    Father had MI in his 53s and bypass surgery.  She has 2 brothers with coronary disease and elevated LP(a).  1 brother  had coronary stent placement.         Vitals:    12/06/23 1348   BP: 124/84   BP Source: Arm, Right Upper   Pulse: 73   SpO2: 99%   PainSc: Six   Weight: 73.3 kg (161 lb 9.6 oz)   Height: 157.5 cm (5' 2)     Body mass index is 29.56 kg/m?[name]     Past Medical History  Patient Active Problem List    Diagnosis Date Noted    Complex care coordination 09/16/2023     Priority: High     Class: Acute    Anxiety 11/01/2023    Iron  deficiency 09/27/2023    Bilateral temporomandibular joint pain 09/26/2023    Restless leg syndrome 07/25/2023     She does report an itchy, creepy crawly sensation of her legs that keeps her up at night.   Prior to January, it was happening 4-5 times/night.   A few nights, it has been happening every night.   Usually lasts 10-15 minutes or so.   She has tried Gabapentin  and Lyrica  in the past (this was more so for fibromyalgia), but this gave her nightmares.       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   This was at [facility].   AHI 3% 12.9  AHI 4% 7.2  Was titrated to CPAP of 6 cm H2O.       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  Radiation-induced pulmonary fibrosis 06/22/2022    Recurrent major depression in full remission 06/22/2022     Was previously on lexapro  which she tolerated well.      Migraine with aura and without status migrainosus, not intractable 06/22/2022    Chronic pain of multiple joints 06/22/2022     X-ray right hand on 11/11/2022 showed severe degenerative arthritis at the first [facility], STT, and second DIP joints. Radial deviation of the second digit at the DIP joint.       Elevated coronary artery calcium  score 06/17/2022     03/06/2022- CT CAC Score [place] : CAC= 136,  80th percentile rank for females age 8-60. Left main: 0 ,LAD: 109 ,LCX: 27 ,RCA: 0         Hiatal hernia with gastroesophageal reflux 06/17/2022     04/01/2022- Per OV Dr. Debby       Menopausal osteoporosis 07/13/2021    Excess skin of breast 01/15/2021    Malignant neoplasm of left breast in female, estrogen receptor positive (CMS-HCC) 12/22/2020     DIAGNOSIS:  Left grade 1 ILC (ER  96%, PR 10%, HER2 0, Ki-67 3%) at 12:00 dx 11/2020     HISTORY: Ms. Badalamenti is a female who presented to the [facility] on 12/24/2020 at age 78 for left breast cancer.  She reports noticing Left breast intermittent discomfort over the last 2 months.  She had Screening mammogram at [facility] on 11/25/2020 which identified a focal asymmetry in the left breast.  She returned for left diagnostic mammgoram and ultrasound on 12/04/2020 which showed an ill-defined hypoechoic area at 12:00 7 cm FTN measuring 1.7 cm.   She underwent Left ultrasound guided biopsy on 12/10/2020 which revealed grade 1 hormone positive, HER2 negative ILC with associated LCIS. She has no breast complaints.  She proceeded with Bilateral total mastectomy/Left SLNB with oncoplastic flat closure on 01/22/2021.  Final surgical pathology revealed multifocal grade 1 ILC that merge to measuring 7.4 cm with associated LCIS, clear margins and 5/5 lymph nodes. The right breast is benign.   Adjuvant chemotherapy and cALND were recommended.  She completed adjuvant chemotherapy of dd AC from 10/3 to 04/06/2021, taxol  was recommended but she declined.  She returned for Left completion ALND on 05/14/2021.  Final surgical pathology revealed 6/14 lymph nodes positive. She finished radiation with Dr. Buckner on 07/01/21.    BREAST IMAGING:  Mammogram:    - Screening mammogram 11/25/2020 ([facility]) revealed scattered fibroglandular tissue density.  Benign appearing microcalcification. Unchanged right upper central anterior breast low density circumscribed masses dating back to 2013.  Focal asymmetry located within the left posterior central breast, 12:00, 7.3 cm FTN most conspicuous on MLO.  Recommend spot compression.    - Left diagnostic mammogram 12/04/2020 ([facility]) revealed irregular density at 12:00 left breast persists.  Ultrasound recommended.    Ultrasound:    Left targeted ultrasound 12/04/2020 ([facility]) revealed an ill-defined area which was slightly hypoechoic at 12:00, 7 cm FTN.  This was indeterminate and biopsy was recommended.   MRI:    - Breast MRI 12/26/2020 ([place]) The breast tissue is scattered areas of fibroglandular tissue.   There is mild background parenchymal enhancement. Left breast: There are multiple faint discontiguous irregular enhancing masses and discontiguous intervening nonmass enhancement involving predominantly the upper and inner left breast from anterior to posterior depth measuring in aggregate 6.3 cm AP by 3.6 cm transverse by 6.4 cm craniocaudal. Several areas demonstrate persistent enhancement kinetics. The artifact from tissue marker  clips (ribbon and heart) at the site of biopsy demonstrating malignancy are seen within the upper left breast at the 12:00 position, middle depth (image 80). A representative discontiguous irregular enhancing mass is seen within the inner left breast at the 9:00 position, middle depth as on image 111, which demonstrates a mammographic correlate on outside CC tomosynthesis slice 18. Anterior extent of abnormal enhancement extends to the base of the left nipple. Posterior extent of abnormal enhancement is approximately 2.1 cm anterior to the underlying pectoralis muscle. There are greater than 5 small round morphologically abnormal and asymmetric level 1 left axillary lymph nodes. No suspicious level 2 or level 3 lymph nodes are seen. No suspicious internal mammary lymph nodes are seen. Right breast: No suspicious mass or nonmass enhancement is seen within the right breast. No suspicious right axillary or internal mammary lymph nodes are identified. Incidental Findings: None     REPRODUCTIVE HEALTH:  Age at first Menarche:  89  Age at First Live Birth:  66  Age at Menopause:  23  Gravida:  8  Para: 8  Breastfeeding:  yes    PROCEDURE:  Bilateral Total mastectomy/Left SLNB oncoplastic flat closure 01/22/2021 ([facility])  2. Left ALND 05/14/2021 [facility])  PATHOLOGY: multifocal grade 1 ILC that merge to measuring 7.4 cm with associated LCIS, clear margins and 5/5 lymph nodes. The right breast is benign.   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  No family history of ovarian or prostate cancer.   MEDICAL ONCOLOGY:    Dr. Kelby  transferred to Dr. Isidoro Adjuvant chemotherapy:  ddACx 4 completed 04/06/2021, Taxol  recommended but declined;   Present therapy:  Letrozole /Verzenio   REFERRED BY:  Dr. Bernardino Ned      Formatting of this note might be different from the original. Formatting of this note might be different from the original. DIAGNOSIS:  Left grade 1 ILC (ER 91-100, PR 11-20, HER 1+, Ki 67 2-5%) at 12:00 dx 11/2020 HISTORY:  Ms. Ainley is a female who presented to the [facility] on 12/24/2020 at age 55 for left breast cancer.  She reports noticing Left breast intermittent discomfort over the last 2 months.  She had Screening mammogram at [facility] on 11/25/2020 which identified a focal asymmetry in the left breast.  She returned for left diagnostic mammgoram and ultrasound on 12/04/2020 which showed an ill-defined hypoechoic area at 12:00 7 cm FTN measuring 1.7 cm.   She underwent Left ultrasound guided biopsy on 12/10/2020 which revealed grade 1 hormone positive, HER2 negative ILC with associated LCIS. She has no breast complaints. BREAST IMAGING: Mammogram:   - Screening mammogram 11/25/2020 ([facility]) revealed scattered fibroglandular tissue density.  Benign appearing microcalcification. Unchanged right upper central anterior breast low density circumscribed masses dating back to 2013.  Focal asymmetry located within the left posterior central breast, 12:00, 7.3 cm FTN most conspicuous on MLO.  Recommend spot compression.   - Left diagnostic mammogram 12/04/2020 ([facility]) revealed irregular density at 12:00 left breast persists.  Ultrasound recommended.  Ultrasound:  Left targeted ultrasound 12/04/2020 ([facility]) revealed an ill-defined area which was slightly hypoechoic at 12:00, 7 cm FTN.  This was indeterminate and biopsy was recommended. MRI:   - Breast MRI 12/26/2020 ([place]) .... REPRODUCTIVE HEALTH: Age at first Menarche:  83 Age at First Live Birth:  27 Age at Menopause:  74 Gravida:  8 Para: 8 Breastfeeding:  yes PROCEDURE:  pending PERTINENT PMH:  Thyroid  Cancer (1980- thyroidectomy), HTN FAMILY HISTORY:  Mother- Breast cancer (65).  No family history of ovarian or prostate cancer. PHYSICAL EXAM on PRESENTATION:   MEDICAL ONCOLOGY:    Dr. Kelby REFERRED BY:  Dr. Bernardino Ned      Hypermobility syndrome 05/24/2014    Degenerative disc disease, lumbar 02/21/2014    Primary hypertension 11/13/2007     Hypertension with pregnancy initially but sustained after 8th pregnancy.            Review of Systems   Constitutional: Positive for malaise/fatigue.   HENT: Negative.     Eyes: Negative.    Cardiovascular:  Positive for chest pain.   Respiratory:  Positive for shortness of breath.    Endocrine: Negative.    Skin: Negative.    Musculoskeletal: Negative.    Gastrointestinal: Negative.    Genitourinary: Negative.    Neurological:  Positive for light-headedness.   Psychiatric/Behavioral: Negative.     Allergic/Immunologic: Negative.        Physical Exam   Vitals reviewed.  Constitutional: She appears well-developed. No distress.   HENT:   Head: Normocephalic.   Cardiovascular: Normal rate.   Pulmonary/Chest: Effort normal. No respiratory distress.   Abdominal: Normal appearance.   Musculoskeletal:         General: Normal range of motion.      Cervical back: Normal range of motion.   Neurological: She is alert and oriented to person, place, and time.   Skin: Skin is warm and dry.   Psychiatric: Her behavior is normal. Judgment and thought content normal.         Cardiovascular Studies      Cardiovascular Health Factors  Vitals BP Readings from Last 3 Encounters:   12/06/23 124/84   12/02/23 (!) 146/85   12/01/23 131/87     Wt Readings from Last 3 Encounters:   12/06/23 73.3 kg (161 lb 9.6 oz) 12/01/23 73.2 kg (161 lb 6.4 oz)   11/29/23 69 kg (152 lb 3.2 oz)     BMI Readings from Last 3 Encounters:   12/06/23 29.56 kg/m?   12/01/23 29.52 kg/m?   11/29/23 27.39 kg/m?      Smoking Social History     Tobacco Use   Smoking Status Never    Passive exposure: Past   Smokeless Tobacco Never   Tobacco Comments    Significant second hand smoke from parents 74-71 years old      Lipid Profile Cholesterol   Date Value Ref Range Status   08/26/2023 179 <200 mg/dL Final     HDL   Date Value Ref Range Status   08/26/2023 55 >40 mg/dL Final     LDL   Date Value Ref Range Status   08/26/2023 107 (H) <100 mg/dL Final     Triglycerides   Date Value Ref Range Status   08/26/2023 157 (H) <150 mg/dL Final      Blood Sugar Hemoglobin A1C   Date Value Ref Range Status   08/25/2023 5.6 4.0 - 5.7 % Final     Comment:     The ADA recommends that most patients with type 1 and type 2 diabetes maintain an A1c level <7%.     Glucose   Date Value Ref Range Status   11/24/2023 123 (H) 70 - 100 mg/dL Final   98/76/7974 899 70 - 100 mg/dL Final   98/89/7974 96 70 - 100 mg/dL Final     Glucose Fasting   Date Value Ref Range  Status   08/26/2023 101 (H) 70 - 100 mg/dL Final   97/93/7974 91 70 - 100 mg/dL Final          Problems Addressed Today  Encounter Diagnoses   Name Primary?    Statin intolerance Yes    Elevated coronary artery calcium  score     Elevated lipoprotein(a)     Mixed hyperlipidemia     Ascending aorta dilatation     Primary hypertension     Family history of coronary artery disease     Encounter for long-term (current) use of high-risk medication     Screening for diabetes mellitus        Assessment and Plan     Elevated CT calcium  score.  Elevated LP(a).  Family history premature coronary artery disease.  Will continue with risk factor reduction.  She is currently without anginal symptoms.  She will continue to follow with Dr. Fayette as planned.  Continue with ezetimibe  and Leqvio      Dyslipidemia with statin intolerance. Untreated LDL 138.  APO B187.  LDL goal less than 65.  She did not tolerate even a low-dose of 5 mg of rosuvastatin .  She had elevated CK levels.  Symptoms have resolved with discontinuing statin.  She remains on Zetia .  LDL at last check was 107.  She has had 1 Leqvio  injection.  Will update lipid panel.  She will come back in the next 1 to 2 weeks for her second Leqvio  injection I will plan on seeing her in 6 months for her third injection.    Hypertension is optimally controlled.       A1c 5.6 when checked in December     BMI of 29 remains slightly elevated continue with heart healthy nutrition balanced meals and portion control and exercise     25-hydroxy vitamin D is 52 is optimal         Current Medications (including today's revisions)   acetaminophen  (TYLENOL ) 325 mg tablet Take two tablets by mouth every 6 hours. Take scheduled for 3 days after surgery, then as needed. Do not exceed 4,000mg  in a 24 hour period. (Patient taking differently: Take two tablets by mouth as Needed. Take scheduled for 3 days after surgery, then as needed. Do not exceed 4,000mg  in a 24 hour period.)    amLODIPine  (NORVASC ) 5 mg tablet Take one tablet by mouth daily.    aspirin  81 mg chewable tablet Chew one tablet by mouth daily.    biotin 1 mg cap Take one capsule by mouth daily.    calcium  carbonate (OS-CAL) 1250 mg (500 mg elemental calcium ) tablet Take one tablet by mouth daily.    CHOLEcalciferoL (vitamin D3) 5000 unit tablet Take one tablet by mouth daily.    diclofenac  sodium (VOLTAREN ) 1 % topical gel Apply four g topically to affected area daily as needed.    ezetimibe  (ZETIA ) 10 mg tablet Take one tablet by mouth daily.    Fish,Bora,Flax Oils-OM3,6,9 #1 (TRIPLE OMEGA) 400-400-400 mg capsule Take one capsule by mouth at bedtime daily.    inclisiran (LEQVIO ) 284 mg/1.5 mL injection syringe Inject 1.5 mL under the skin every 180 days. Initially then at 3 months and then every 6 months    iron  dextran complex (INFED  IJ) Inject  to area(s) as directed once. (Patient not taking: Reported on 12/06/2023)    metoprolol  succinate XL (TOPROL  XL) 50 mg extended release tablet Take one tablet by mouth at bedtime daily.    Miscellaneous Medical Supply  misc DME requirements, rx to include the following typed information:    NPI: [id number]  Doctor's typed name: [name], MD  ICD 10: G47.33  Length of need: 99 months  Start date:02/14/2023  Signature date: 02/14/2023  Detailed equipment: CPAP machine with any accessories. (Patient not taking: Reported on 12/06/2023)    naltrexone  4.5 MG oral capsule (COMPOUND) Take one capsule by mouth daily.    other medication Take one Dose by mouth daily. K Complete K1 & K2 as MK-4 & MK-7 : Take 1 softgel by mouth once daily    Selenium 100 mcg tab Take one tablet by mouth daily.    SUMAtriptan  succinate (IMITREX ) 25 mg tablet Take one tablet by mouth at onset of headache. May repeat after 2 hours if needed. Max of 200 mg in 24 hours. (Patient taking differently: as Needed for Migraine symptoms. Take one tablet by mouth at onset of headache. May repeat after 2 hours if needed. Max of 200 mg in 24 hours.)    SYNTHROID  100 mcg tablet Take one tablet by mouth daily 30 minutes before breakfast.    tiZANidine (ZANAFLEX) 2 mg tablet Take one tablet by mouth twice daily as needed.    tretinoin  (RETIN-A ) 0.025 % topical cream Apply  topically to affected area at bedtime daily. (Patient not taking: Reported on 12/06/2023)    vit A/vit C/vit E/zinc/copper (PRESERVISION AREDS PO) Take 1 tablet by mouth daily.    vitamins, B complex tab Take one tablet by mouth daily.    vitamins, multiple cap Take one capsule by mouth daily.

## 2023-12-07 ENCOUNTER — Encounter: Admit: 2023-12-07 | Discharge: 2023-12-07 | Payer: MEDICARE

## 2023-12-07 ENCOUNTER — Ambulatory Visit: Admit: 2023-12-07 | Discharge: 2023-12-08 | Payer: MEDICARE

## 2023-12-08 ENCOUNTER — Encounter: Admit: 2023-12-08 | Discharge: 2023-12-08 | Payer: MEDICARE

## 2023-12-08 ENCOUNTER — Ambulatory Visit: Admit: 2023-12-08 | Discharge: 2023-12-08 | Payer: MEDICARE

## 2023-12-09 ENCOUNTER — Encounter: Admit: 2023-12-09 | Discharge: 2023-12-09 | Payer: MEDICARE

## 2023-12-09 ENCOUNTER — Inpatient Hospital Stay: Admit: 2023-12-09 | Discharge: 2023-12-09 | Payer: MEDICARE

## 2023-12-09 NOTE — Telephone Encounter
 Lvm informing pt surgery will be scheduled on Monday, 8/4 as discussed at appointment yesterday.  Asked for return call to confirm.

## 2023-12-10 ENCOUNTER — Encounter: Admit: 2023-12-10 | Discharge: 2023-12-10 | Payer: MEDICARE

## 2023-12-11 MED FILL — PRESURGERY KIT B: ORAL | 1 days supply | Qty: 1 | Fill #0 | Status: AC

## 2023-12-12 ENCOUNTER — Encounter: Admit: 2023-12-12 | Discharge: 2023-12-12 | Payer: MEDICARE

## 2023-12-13 ENCOUNTER — Encounter: Admit: 2023-12-13 | Discharge: 2023-12-13 | Payer: MEDICARE

## 2023-12-13 ENCOUNTER — Ambulatory Visit: Admit: 2023-12-13 | Discharge: 2023-12-14 | Payer: MEDICARE

## 2023-12-13 MED ORDER — INCLISIRAN 284 MG/1.5 ML SC SYRG
284 mg | Freq: Once | SUBCUTANEOUS | 0 refills | Status: CP
Start: 2023-12-13 — End: ?
  Administered 2023-12-13: 15:00:00 284 mg via SUBCUTANEOUS

## 2023-12-13 NOTE — Progress Notes
 Patient name and date of birth verified.     Injection administered in RUQ without complication. Pt tolerated injection well.     All questions answered. Patient verbalized understanding.

## 2023-12-15 ENCOUNTER — Encounter: Admit: 2023-12-15 | Discharge: 2023-12-15 | Payer: MEDICARE

## 2023-12-15 ENCOUNTER — Ambulatory Visit: Admit: 2023-12-15 | Discharge: 2023-12-15 | Payer: MEDICARE

## 2023-12-19 ENCOUNTER — Encounter: Admit: 2023-12-19 | Discharge: 2023-12-19 | Payer: MEDICARE

## 2023-12-19 ENCOUNTER — Ambulatory Visit: Admit: 2023-12-19 | Discharge: 2023-12-20 | Payer: MEDICARE

## 2023-12-19 DIAGNOSIS — M810 Age-related osteoporosis without current pathological fracture: Principal | ICD-10-CM

## 2023-12-20 ENCOUNTER — Encounter: Admit: 2023-12-20 | Discharge: 2023-12-20 | Payer: MEDICARE

## 2023-12-21 ENCOUNTER — Encounter: Admit: 2023-12-21 | Discharge: 2023-12-21 | Payer: MEDICARE

## 2023-12-21 DIAGNOSIS — I10 Essential (primary) hypertension: Principal | ICD-10-CM

## 2023-12-21 NOTE — Progress Notes
 Remote Patient Monitoring Call Attempt      Attempted call to patient for follow up of Remote Patient Monitoring program, left voice mail.    Patient's 30 day BP average is 125/81, HR 71. Gave call back number for questions or concerns.    Remote Patient Monitoring          12/16/2023 12/17/2023 12/18/2023    10:00 12/19/2023 12/20/2023    05:53 12/21/2023    10:21   BP   Systolic BP 116 mmHg 121 mmHg  129 mmHg     Diastolic BP 80 mmHg 77 mmHg  87 mmHg     Heart Rate   Heart Rate  79 bpm  83 bpm   68 bpm     SPO2   SPO2 93 % 96 %  97 %     Weight   Weight 158.4 Lb   159.2 Lb 158.6 Lb    Weight Pt Entered  158.4 Lb 158 Lb   159.2 Lb      Details          More values are hidden. Newest values shown. Go to activity for more data.              Home Monitoring Minutes (this encounter): 2

## 2023-12-26 ENCOUNTER — Encounter: Admit: 2023-12-26 | Discharge: 2023-12-26 | Payer: MEDICARE

## 2023-12-26 DIAGNOSIS — I10 Essential (primary) hypertension: Principal | ICD-10-CM

## 2023-12-27 ENCOUNTER — Encounter: Admit: 2023-12-27 | Discharge: 2023-12-27 | Payer: MEDICARE

## 2023-12-27 ENCOUNTER — Ambulatory Visit: Admit: 2023-12-27 | Discharge: 2023-12-28 | Payer: MEDICARE

## 2023-12-27 MED ORDER — ROMOSOZUMAB-AQQG 105 MG/1.17 ML SC SYRG
210 mg | Freq: Once | SUBCUTANEOUS | 0 refills
Start: 2023-12-27 — End: ?

## 2023-12-27 NOTE — Progress Notes
 SPINE CENTER HISTORY AND PHYSICAL    No chief complaint on file.         HISTORY OF PRESENT ILLNESS:  [name] referred by Dr. Fernando for evaluation of her bone health.  She has been previously treated on Reclast  for osteoporosis but had to discontinue it because of severe joint and bone pain.  Dr. Alisa surgical plan includes a L3-4 interbody fusion along with a posterior instrumentation and fusion.  She has no history of Paget's disease.  Patient has not had any problem with her teeth or dental her dental condition is quite good.    PHYSICAL EXAM:  This a pleasant female in no acute distress.  Mood and affect within normal limits.  Stands balanced.  No pain to palpation in the low back.  Motor strength is 5 out of 5 in bilateral lower extremities.    RADIOGRAPHIC EVALUATION:  She has spondylolisthesis at L3-4 with a's symmetric collapse.    IMPRESSION:  Osteoporosis with a T-score of -2.8.  Her FRAX score shows a 25% risk of fragility fracture and a 3.5% risk of hip fracture    PLAN:  Discussed findings with the patient.  Given the fact that she failed a trial of Reclast  and continues to have osteoporosis I would recommend Evenity  for her.  I will place that order.            Vitamin D (25-OH) Total   Date/Time Value Ref Range Status   09/13/2023 11:31 AM 71 30 - 80 ng/mL Final       PTH Hormone   Date Value Ref Range Status   12/19/2023 36.3 10.0 - 65.0 pg/mL Final   09/14/2021 48.4 10 - 65 PG/ML Final     Calcium    Date Value Ref Range Status   11/24/2023 8.8 8.5 - 10.6 mg/dL Final        TSH   Date Value Ref Range Status   09/23/2023 1.87 0.35 - 5.00 ?IU/mL Final   03/04/2023 1.60 0.35 - 5.00 MCU/ML Final        T4-Free   Date Value Ref Range Status   05/09/2023 0.8 0.6 - 1.6 ng/dL Final   90/88/7975 0.8 0.6 - 1.6 NG/DL Final       Phosphorus   Date Value Ref Range Status   12/19/2023 3.6 2.0 - 4.5 mg/dL Final       Magnesium    Date Value Ref Range Status   12/19/2023 2.3 1.6 - 2.6 mg/dL Final       Lab Results   Component Value Date    CR 0.88 11/24/2023        Lab Results   Component Value Date    ALKPHOS 51 11/24/2023       Lab Results   Component Value Date    ALBUMIN 4.5 11/24/2023       Lab Results   Component Value Date    TOTPROT 7.1 12/19/2023       Lab Results   Component Value Date    OBSCA 1.18 12/19/2023    IONIZED CALCIUM     No results found for: UCR24    No results found for: UCA24    No results found for: LWJ75    Lab Results   Component Value Date    UCRRAN 182 03/04/2023       BONE DENSITY SPINE/HIP 04/25/2023    Narrative  BONE DENSITOMETRY    CLINICAL INDICATION:  63 years old Female,  malignant neoplasm of central portion of left breast in female, estrogen receptor positive. Menopausal osteoporosis. Use of aromatase inhibitor.    COMPARISON: 04/22/2022    FINDINGS: DEXA scan of the lumbar spine and bilateral hips were performed with GE Lunar Prodigy Advance. FRAX score was calculated from patient reported risk factors and femoral neck bone density.    LUMBAR SPINE, L1-L4  Current: 1.123 g/cm2, T-score of -0.6  Previous: 1.099 g/cm2, T-score of -0.8  There are however hypertrophic degenerative changes of the lumbar spine which could artificially elevate the bone mineral density.    LEFT FEMORAL NECK  Current: 0.650 g/cm2, T-score of -2.8  Previous: 650 g/cm2, T-score of -2.8    LEFT TOTAL HIP  Current: 0.690 g/cm2, T-score of -2.5  Previous: 0.698 g/cm2, T-score of -2.5    RIGHT FEMORAL NECK  Current: 0.651 g/cm2, T-score of -2.8  Previous: 0.629 g/cm2, T-score of -2.9    RIGHT TOTAL HIP  Current: 0.691 g/cm2, T-score of -2.5  Previous: 0.685 g/cm2, T-score of -2.6    FRAX SCORE  Calculation of fracture risk using the FRAX model is not appropriate in certain settings.  It was not performed in this patient because the patient met one or more of the following conditions:  pre-menopausal status, men < age 38, normal bone density, osteoporotic patients, use of hormonal therapy within 1 year, use of anti-resorptive therapy within 2 years, or bilateral hip replacements.    WHO Criteria for Diagnosis of Osteoporosis (T-score)*  Normal (-1.0 and above)  Low bone mass, referred to as osteopenia (Between -1.0 and -2.5)  Osteoporosis (-2.5 and below)    *Based on region with lowest bone mineral density.    Impression  Statistically stable bone mineral density of lumbar spine and both hips. Persistent osteoporosis.    General comments regarding interpretation of bone mineral density measurements:    a) Consider FDA-approved medical therapies in the setting of 1) Hip or vertebral fracture, 2) Osteoporosis, and 3) low bone mass, referred to as osteopenia, with FRAX score of greater than or equal to 3% for hip fracture or greater than or equal to 20% for major osteoporotic fracture. Treatment may also be indicated based on clinical judgement and/or patient preference.    b) Interval between BMD testing should be determined according to each patient's clinical status: typically one year after initiation or change of therapy is appropriate, with longer intervals once therapeutic effect is established. In conditions with rapid bone loss, such as glucocorticoid therapy, testing more frequently is appropriate.      Finalized by Wendell Yap, M.D. on 04/25/2023 10:16 AM. Dictated by Wendell Yap, M.D. on 04/25/2023 10:14 AM.                            Past Medical History:    Accidental fall    Acquired hypothyroidism    Actinic keratosis    Adverse drug reaction    Allergy    Anxiety    Aortic aneurysm    Ashkenazi Jewish ancestry requiring population-specific genetic screening    Back pain    Breast cancer in female (CMS-HCC)    Cancer of thyroid (CMS-HCC)    Chest pain    Constipation    Coronary artery disease    Coronary atherosclerosis    Degenerative disc disease, cervical    Degenerative disc disease, lumbar    Degenerative disc disease, thoracic    Depressive disorder, not elsewhere classified  Dizziness    Esophageal stricture    Essential hypertension    Family history of coronary artery disease in brother    Fibromyalgia    Genetic testing    GERD (gastroesophageal reflux disease)    H/O total thyroidectomy    Hashimoto's thyroiditis    Heart murmur    Hiatal hernia    High cholesterol    History of bilateral mastectomy    History of blood in urine    History of external beam radiation therapy    History of thyroid cancer    Hormone replacement therapy    Hx antineoplastic chemo    Hyperparathyroidism    Hyperthyroidism    Hyponatremia    Hypothyroid    Incontinence    Infection    Inflammatory arthritis    Joint pain    Limb alert care status    Low vitamin D level    Migraine    Mild mitral and aortic regurgitation    Nerve injury    Obstructive sleep apnea    Osteoporosis    Other and unspecified hyperlipidemia    Other malignant neoplasm without specification of site    Peripheral neuropathy    Personal history of irradiation    Postmenopausal    PUD (peptic ulcer disease)    Pulmonary fibrosis (CMS-HCC)    Radiation-induced pulmonary fibrosis    Seasonal allergic reaction    Sleep apnea    Spinal headache    Spinal stenosis    Thyroid nodule    Torn meniscus    Ulcer    Unspecified deficiency anemia     Surgical History:   Procedure Laterality Date    HX WRIST FRACTURE SURGERY  1993    Baker's thumb with fixation    ARTHROPLASTY  2001    L ACL    COLONOSCOPY  2017    normal    ARTHROSCOPY KNEE WITH PARTIAL LATERAL MENISCECTOMY AND LEFT KNEE INJECTION. Right 01/08/2021    Performed by Niels Dorise MATSU, MD at [facility] OR    BILATERAL TOTAL MASTECTOMIES Bilateral 01/22/2021    Performed by Raejean Blane SAUNDERS, MD at [facility] OR    INTRAOPERATIVE SENTINEL LYMPH NODE IDENTIFICATION WITH/ WITHOUT NON-RADIOACTIVE DYE INJECTION Left 01/22/2021    Performed by Raejean Blane SAUNDERS, MD at [facility] OR    INJECTION RADIOACTIVE TRACER FOR SENTINEL NODE IDENTIFICATION Left 01/22/2021    Performed by Raejean Blane SAUNDERS, MD at [facility] OR    LEFT AXILLARY SENTINEL LYMPH NODE BIOPSY Left 01/22/2021    Performed by Raejean Blane SAUNDERS, MD at [facility] OR    BILATERAL CHEST FLAT CLOSURE Bilateral 01/22/2021    Performed by everitt Elin Rosaline CHRISTELLA, MD at [facility] OR    BILATERAL CHEST FLAT CLOSURE x 8 Bilateral 01/22/2021    Performed by everitt Elin Rosaline CHRISTELLA, MD at [facility] OR    EXCISION BENIGN LESION 0.5 CM OR LESS - TORSO Right 01/22/2021    Performed by everitt Elin Rosaline CHRISTELLA, MD at [facility] OR    TUNNELED VENOUS PORT PLACEMENT Right 02/19/2021    For chemo; Removed a few months later    Placement of port-a-cath - 73F Right 02/19/2021    Performed by Freund, Elsie LITTIE Raddle., MD at [facility]    FLUOROSCOPIC GUIDANCE CENTRAL VENOUS ACCESS DEVICE PLACEMENT/ REPLACEMENT/ REMOVAL N/A 02/19/2021    Performed by Claudean Elsie LITTIE Raddle., MD at [facility] OR    Left Completion Axillary Lymph Node Dissection Left 05/14/2021  Performed by Raejean Blane SAUNDERS, MD at [facility] OR    REMOVAL TUNNELED CENTRAL VENOUS ACCESS DEVICE INCLUDING PORT/ PUMP Right 05/14/2021    Performed by Raejean Blane SAUNDERS, MD at [facility] OR    COLONOSCOPY DIAGNOSTIC WITH SPECIMEN COLLECTION BY BRUSHING/ WASHING - FLEXIBLE N/A 10/14/2021    Performed by Gladis Morene HERO, MD at [facility]    ESOPHAGOGASTRODUODENOSCOPY WITH SPECIMEN COLLECTION BY BRUSHING/ WASHING N/A 06/17/2022    Performed by Chipper Neas, MD at [facility] ENDO    ESOPHAGOGASTRODUODENOSCOPY WITH DILATION ESOPHAGUS WITH BALLOON 30 MM OR GREATER - FLEXIBLE N/A 06/17/2022    Performed by Chipper Neas, MD at [facility] ENDO    ANGIOGRAPHY CORONARY ARTERY WITH LEFT HEART CATHETERIZATION N/A 07/16/2022    Performed by Charlanne Francois, MD at [facility] CATH LAB    PERCUTANEOUS CORONARY STENT PLACEMENT WITH ANGIOPLASTY N/A 07/16/2022    Performed by Charlanne Francois, MD at [facility] CATH LAB    ESOPHAGEAL MOTILITY STUDY N/A 02/08/2023    Performed by Ledora Catalina, MD at [facility] ENDO    ESOPHAGOGASTRODUODENOSCOPY WITH BIOPSY - FLEXIBLE N/A 02/08/2023    Performed by Ledora Catalina, MD at [facility] ENDO GASTROESOPHAGEAL REFLUX TEST WITH MUCOSAL ATTACHED TELEMETRY PH ELECTE N/A 02/08/2023    Performed by Ledora Catalina, MD at [facility] ENDO    ESOPHAGOGASTRODUODENOSCOPY WITH DILATION GASTRIC/ DUODENAL STRICTURE - FLEXIBLE N/A 02/08/2023    Performed by Ledora Catalina, MD at [facility] ENDO    LAPAROSCOPIC REPAIR PARAESOPHAGEAL HERNIA WITHOUT FUNDOPLASTY N/A 06/15/2023    Performed by Jacqulyn Delon BIRCH, MD at [facility] OR    LAPAROSCOPIC REPAIR PARAESOPHAGEAL HERNIA WITH FUNDOPLASTYUSING TIF DEVICE N/A 06/15/2023    Performed by Ledora Catalina, MD at [facility] OR    ESOPHAGOGASTRODUODENOSCOPY WITH SPECIMEN COLLECTION BY BRUSHING/ WASHING N/A 06/15/2023    Performed by Ledora Catalina, MD at [facility] OR    ESOPHAGOGASTRODUODENOSCOPY, WITH BALLOON DILATION OF LESS THAN 30 MILLIMETERS N/A 11/23/2023    Performed by Riccardo Sane, MD at [facility] OR    ACL RECONSTRUCTION  06/03/1999    BLADDER SURGERY  Nov 2024    [place] procedure by non-[facility] doc    BREAST CYST ASPIRATION  Dec 2000    Neg for cancer, left breast cyst    BREAST SURGERY  01/22/2021    Bilat mastectomy    CARDIAC CATHERIZATION  07/16/2022    Dr. Charlanne Neth, no stent    CARDIOVASCULAR STRESS TEST  Feb 2024    At [facility]    ECHOCARDIOGRAM PROCEDURE  2022    See oncology history    ELECTROCARDIOGRAM  06/17/22    Most recent 12 lead done post egd    ESOPHAGUS SURGERY      Dilation    EVENT MONITOR  February 2024    FRACTURE SURGERY  1993    ORIF L 1st metacarpal    HX BREAST BIOPSY  2022    See [place] records positive for lobular    HX CARPAL TUNNEL RELEASE  1996    With second and other oregbnancies    HX EPISIOTOMY  1994    Healed well    HX HEART CATHETERIZATION  Feb 2024 dx    No stents. (2) 50% blockages LAD    HX MASTECTOMY  09/03/222    Bilateral; cancer in left breast    HX THYROIDECTOMY  1980    Parathyroids not removed    KNEE SURGERY  L ACL as above    LAPAROSCOPY  Bladder April 2025  Knee, bladder    LYMPH NODE DISSECTION  12/29/222    Left    PORTACATH PLACEMENT 02/05/2021    Date approximate; for chemo, right. Removed 04/2021    PR LAPAROSCOPY SURG RPR INITIAL INGUINAL HERNIA  Jun 14, 2023    Esophageal    SPINE SURGERY  03/2023    MILD at [place], Dr. Velinda Cordia    SURGERY  [facility] 2025    MOS procedure x2 face    THYROIDECTOMY  1980    UMBILICAL ARTERIAL CATH - BEDSIDE  February, 2024 at [place]    Mild CAD     Allergies   Allergen Reactions    Mango ANAPHYLAXIS    Other [Unclassified Drug] ANAPHYLAXIS     DUCK Meat and Eggs     Imdur  [Isosorbide  Mononitrate] CHEST TIGHTNESS and RASH    Iodinated Contrast Media RASH and ITCHING     04/2023 pt developed throat itching after IV Contrast in CT, itching subsided after 5 minutes without intervention.   She developed rash on back and neck after hospitalization 06/2022 where she underwent heart cath. Unsure if contrast was   cause of rash, but it developed shortly after procedure.    Macrobid  [Nitrofurantoin  Monohyd/M-Cryst] SEE COMMENTS     Patient has pulmonary fibrosis so this antibiotic is not preferred unless this is the only medication available .     Oxycodone  NAUSEA ONLY     Prefers tramadol     Sudafed [Pseudoephedrine Hcl] PALPITATIONS       Current Outpatient Medications:     acetaminophen  (TYLENOL ) 325 mg tablet, Take two tablets by mouth every 6 hours. Take scheduled for 3 days after surgery, then as needed. Do not exceed 4,000mg  in a 24 hour period. (Patient taking differently: Take two tablets by mouth as Needed. Take scheduled for 3 days after surgery, then as needed. Do not exceed 4,000mg  in a 24 hour period.), Disp: 40 tablet, Rfl: 0    amLODIPine  (NORVASC ) 5 mg tablet, Take one tablet by mouth daily., Disp: 90 tablet, Rfl: 3    aspirin  81 mg chewable tablet, Chew one tablet by mouth daily., Disp: 90 tablet, Rfl: 0    biotin 1 mg cap, Take one capsule by mouth daily., Disp: , Rfl:     calcium  carbonate (OS-CAL) 1250 mg (500 mg elemental calcium ) tablet, Take one tablet by mouth daily., Disp: , Rfl: CHOLEcalciferoL (vitamin D3) 5000 unit tablet, Take one tablet by mouth daily., Disp: , Rfl:     diclofenac  sodium (VOLTAREN ) 1 % topical gel, Apply four g topically to affected area daily as needed., Disp: , Rfl:     ezetimibe  (ZETIA ) 10 mg tablet, Take one tablet by mouth daily., Disp: 90 tablet, Rfl: 3    Fish,Bora,Flax Oils-OM3,6,9 #1 (TRIPLE OMEGA) 400-400-400 mg capsule, Take one capsule by mouth at bedtime daily., Disp: , Rfl:     inclisiran (LEQVIO ) 284 mg/1.5 mL injection syringe, Inject 1.5 mL under the skin every 180 days. Initially then at 3 months and then every 6 months, Disp: 1 mL, Rfl: 1    iron  dextran complex (INFED  IJ), Inject  to area(s) as directed once., Disp: , Rfl:     metoprolol  succinate XL (TOPROL  XL) 50 mg extended release tablet, Take one tablet by mouth at bedtime daily., Disp: 90 tablet, Rfl: 3    Miscellaneous Medical Supply misc, DME requirements, rx to include the following typed information:  NPI: [id number] Doctor's typed name: Sherrod Chandler, Sherrod Glass, MD ICD 10:  G47.33 Length of need: 99 months Start date:02/14/2023 Signature date: 02/14/2023 Detailed equipment: CPAP machine with any accessories., Disp: 1 each, Rfl: 0    naltrexone  4.5 MG oral capsule (COMPOUND), Take one capsule by mouth daily., Disp: 90 capsule, Rfl: 1    other medication, Take one Dose by mouth daily. K Complete K1 & K2 as MK-4 & MK-7 : Take 1 softgel by mouth once daily, Disp: , Rfl:     pregabalin  (LYRICA ) 50 mg capsule, Take one capsule by mouth daily., Disp: 30 capsule, Rfl: 0    Selenium 100 mcg tab, Take one tablet by mouth daily., Disp: , Rfl:     SUMAtriptan  succinate (IMITREX ) 25 mg tablet, Take one tablet by mouth at onset of headache. May repeat after 2 hours if needed. Max of 200 mg in 24 hours. (Patient taking differently: as Needed for Migraine symptoms. Take one tablet by mouth at onset of headache. May repeat after 2 hours if needed. Max of 200 mg in 24 hours.), Disp: 9 tablet, Rfl: 0 SYNTHROID  100 mcg tablet, Take one tablet by mouth daily 30 minutes before breakfast., Disp: 90 tablet, Rfl: 1    tiZANidine (ZANAFLEX) 2 mg tablet, Take one tablet by mouth twice daily as needed., Disp: , Rfl:     tretinoin  (RETIN-A ) 0.025 % topical cream, Apply  topically to affected area at bedtime daily., Disp: 45 g, Rfl: 11    vit A/vit C/vit E/zinc/copper (PRESERVISION AREDS PO), Take 1 tablet by mouth daily., Disp: , Rfl:     vitamins, B complex tab, Take one tablet by mouth daily., Disp: , Rfl:     vitamins, multiple cap, Take one capsule by mouth daily., Disp: , Rfl:   family history includes Alcohol abuse in her father; Arthritis in her mother and paternal grandmother; Arthritis-osteo in her paternal grandmother; Asthma in her brother; Back pain in her brother, brother, brother, mother, and paternal grandmother; Basal Cell Carcinoma in her brother, brother, and brother; Birth Defect in her daughter and nephew; Cancer in her brother, mother, and other family members; Cancer-Breast (age of onset: 26) in her mother; Trent (age of onset: 14) in her maternal great-aunt; Cancer-Skin in her brother; Coronary Artery Disease in her father; Diabetes in her brother, brother, brother, father, maternal grandfather, paternal grandfather, and paternal grandmother; Early Death in her father; Growth/Development Disorders in her daughter; Heart Attack in her father; Heart Disease in her father; Heart problem in her brother and father; High Cholesterol in her brother; Hypertension in her brother, brother, brother, father, maternal grandfather, and mother; Joint Pain in her mother; Miscarriage in her daughter and mother; Miscarriages in her mother; Neck Pain in her mother; Scoliosis in her daughter; Stroke in her brother and maternal uncle; Sudden Cardiac Death in her father; Thyroid Disease in her maternal uncle, maternal uncle and another family member.  Social History     Socioeconomic History    Marital status: Married    Number of children: 8   Occupational History    Occupation: Retired Engineer, civil (consulting)     Comment: Worked in Multimedia programmer; also worked in the ambulatory setting   Tobacco Use    Smoking status: Never     Passive exposure: Past    Smokeless tobacco: Never    Tobacco comments:     Significant second hand smoke from parents 18-81 years old   Vaping Use    Vaping status: Never Used   Substance and Sexual Activity    Alcohol  use: Not Currently     Comment: Prior to 2020, socially, one drink once a week at most    Drug use: Yes     Types: Marijuana     Comment: Not since minor experimentation at college parties    Sexual activity: Not Currently     Partners: Male     Birth control/protection: Post-menopausal, None     Review of Systems  Vitals:    12/27/23 1359   BP: (P) 126/84   Pulse: (P) 72   SpO2: (P) 100%   PainSc: Five   Weight: (P) 72.6 kg (160 lb)   Height: (P) 160 cm (5' 3)        Pain Score: Five  Body mass index is 28.34 kg/m? (pended).

## 2023-12-28 ENCOUNTER — Encounter: Admit: 2023-12-28 | Discharge: 2023-12-28 | Payer: MEDICARE

## 2023-12-28 DIAGNOSIS — M810 Age-related osteoporosis without current pathological fracture: Principal | ICD-10-CM

## 2023-12-31 ENCOUNTER — Encounter: Admit: 2023-12-31 | Discharge: 2023-12-31 | Payer: MEDICARE

## 2024-01-02 ENCOUNTER — Encounter: Admit: 2024-01-02 | Discharge: 2024-01-02 | Payer: MEDICARE

## 2024-01-03 ENCOUNTER — Encounter: Admit: 2024-01-03 | Discharge: 2024-01-03 | Payer: MEDICARE

## 2024-01-06 ENCOUNTER — Encounter: Admit: 2024-01-06 | Discharge: 2024-01-06 | Payer: MEDICARE

## 2024-01-09 ENCOUNTER — Encounter: Admit: 2024-01-09 | Discharge: 2024-01-09 | Payer: MEDICARE

## 2024-01-09 DIAGNOSIS — I10 Essential (primary) hypertension: Principal | ICD-10-CM

## 2024-01-09 NOTE — Progress Notes
 Remote Patient Monitoring Completion            Patient disenrolled due to graduated. Verified correct address for equipment pick up. Patient educated that UPS will bring shipping label for correct return address.    Remote Patient Monitoring          01/04/2024 01/05/2024 01/06/2024    06:12 01/07/2024    09:42 01/08/2024 01/09/2024    05:56   BP   Systolic BP  124 mmHg 136 mmHg   119 mmHg    Diastolic BP  88 mmHg 88 mmHg   86 mmHg    Heart Rate   Heart Rate  68 bpm 65 bpm   69 bpm    Weight   Weight  161.8 Lb 160.2 Lb  160.2 Lb 159 Lb   Weight Pt Entered 161.8 Lb   160.4 Lb        Details          More values are hidden. Newest values shown. Go to activity for more data.                Home Monitoring Minutes (this encounter): 5

## 2024-01-10 ENCOUNTER — Encounter: Admit: 2024-01-10 | Discharge: 2024-01-10 | Payer: MEDICARE

## 2024-01-11 ENCOUNTER — Encounter: Admit: 2024-01-11 | Discharge: 2024-01-11 | Payer: MEDICARE

## 2024-01-13 ENCOUNTER — Ambulatory Visit: Admit: 2024-01-13 | Discharge: 2024-01-13 | Payer: MEDICARE

## 2024-01-13 ENCOUNTER — Encounter: Admit: 2024-01-13 | Discharge: 2024-01-13 | Payer: MEDICARE

## 2024-01-13 DIAGNOSIS — E611 Iron deficiency: Principal | ICD-10-CM

## 2024-01-13 MED ORDER — ROMOSOZUMAB-AQQG 105 MG/1.17 ML SC SYRG
210 mg | Freq: Once | SUBCUTANEOUS | 0 refills
Start: 2024-01-13 — End: ?

## 2024-01-13 MED ORDER — ROMOSOZUMAB-AQQG 105 MG/1.17 ML SC SYRG
210 mg | Freq: Once | SUBCUTANEOUS | 0 refills | Status: CP
Start: 2024-01-13 — End: ?
  Administered 2024-01-13: 14:00:00 210 mg via SUBCUTANEOUS

## 2024-01-13 NOTE — Progress Notes
 Patient received first Evenity  injection.

## 2024-01-17 ENCOUNTER — Encounter: Admit: 2024-01-17 | Discharge: 2024-01-17 | Payer: MEDICARE

## 2024-01-17 NOTE — Progress Notes
 Attempted to contact patient for routine ccm call left vm message requesting return call back.

## 2024-01-18 ENCOUNTER — Encounter: Admit: 2024-01-18 | Discharge: 2024-01-18 | Payer: MEDICARE

## 2024-01-18 NOTE — Progress Notes
 General Risk Score 7  HCC Score 0.348  Program criteria met - HTN, High GRS score  Problem List (Diagnosis)HTN    Cindy Randolph was at another appointment at the time of this call requesting call back in a couple of hours    Patient was identified using dual identification of name and date of birth with repeat back    Patient Reports:    1.Surgery has been placed on hold due to osteoporosis and starting new medications.    2.Graduated from [facility] program BP have been WNL    Treatment Goals and Expected Outcomes (patient centered)    1.Starting a couple of new medication Evinity  2.Lose weight      Barriers to Care:    1.High GRS score  2.Generalized joint and muscle pain  3.Surgery on hold due to starting new medication due to osteoporosis    Education:    1.Suggested Cindy Randolph try using the over the counter lidocaine  patches to see if that might be helpful with pain relief while she is beginning new medications    Care Plan:    1.Begin new medications for pain  2.Try lidocaine  patch OTC  3.Continue to support and provide any resources needed  4. Instructed patient to call care manager any time with questions or concerns.   5. Routing note to PCP    LOV:  11/01/23      Upcoming appointments include:    Future Appointments   Date Time Provider Department Center   01/18/2024 10:40 AM Genoveva Ruben SQUIBB, MD [facility]   01/30/2024  9:40 AM Ledora Catalina, MD [facility] IM   02/09/2024 10:00 AM Mardy Elida NOVAK, APRN-NP [facility]   02/13/2024  8:15 AM INF30 [facility] None   02/22/2024 11:20 AM [name], MD [facility] IM   03/13/2024  1:30 PM Zulema Gerard HERO, PA-C [id number] IM   03/27/2024  3:30 PM Fayette Carlin NOVAK, MD [facility] CVM Exam   04/18/2024 10:15 AM CT - [facility] Rad   04/23/2024  1:40 PM Isidoro Tinnie BRAVO, MD [facility] Exam   04/30/2024  8:45 AM Boris Bard LABOR, MD [id number] IM   06/19/2024 10:00 AM Susa Orlean LABOR, APRN-NP [facility] CVM Exam   06/26/2024  1:20 PM Ann Vicenta BROCKS, MD [facility] SPINE     Next review jamee of plan (approximate):  02/20/24/PRN

## 2024-01-20 ENCOUNTER — Encounter: Admit: 2024-01-20 | Discharge: 2024-01-20 | Payer: MEDICARE

## 2024-01-20 DIAGNOSIS — I10 Essential (primary) hypertension: Principal | ICD-10-CM

## 2024-01-20 NOTE — Progress Notes
 RPM Monthly Monitoring Minutes    Patient submitted RPM data 13 days this month accounting for 13 minutes of monitoring. This time is in addition to time charted in individual encounters.    Home Monitoring Minutes (this encounter): 13

## 2024-01-27 ENCOUNTER — Encounter: Admit: 2024-01-27 | Discharge: 2024-01-27 | Payer: MEDICARE

## 2024-01-27 DIAGNOSIS — I10 Essential (primary) hypertension: Principal | ICD-10-CM

## 2024-01-30 ENCOUNTER — Encounter: Admit: 2024-01-30 | Discharge: 2024-01-30 | Payer: MEDICARE

## 2024-01-30 ENCOUNTER — Ambulatory Visit: Admit: 2024-01-30 | Discharge: 2024-01-31 | Payer: MEDICARE

## 2024-01-30 DIAGNOSIS — K5904 Chronic idiopathic constipation: Principal | ICD-10-CM

## 2024-01-30 DIAGNOSIS — Z9889 Other specified postprocedural states: Secondary | ICD-10-CM

## 2024-01-30 DIAGNOSIS — R198 Other specified symptoms and signs involving the digestive system and abdomen: Secondary | ICD-10-CM

## 2024-01-30 NOTE — Patient Instructions
 Dr. Irl recommendations:      Return to clinic as needed.

## 2024-01-30 NOTE — Progress Notes
 Patient Name: Cindy Randolph             MRN: [id number]  Date of Service: 01/30/2024   Referring: Self, Referral  No address on file  PCP: Sherrod Laraine Sherrod Safwan           Assessment:      The patient is 63 y.o.. year-old female s/p cTIF for hiatal hernia and gastroesophageal reflux disease in Jan 2025 and EGD with esophageal dilation in 11/2023 reports no acid reflux and improved swallowing. Occasionally experiences sensation of food getting stuck, managed by smaller bites and thorough chewing. Discontinued omeprazole  and prilosec a month post-surgery.    The constipation is improved but has difficulty with consistent straining during bowel movements. Concern for hernia recurrence due to straining. Naltrexone  may contribute to constipation.       1.  Reflux: s/p cTIF and is off of PPI. Doing very well.  2. OP: was able to stop PPI post cTIF  3. Constipation: stopped miralax . Strains occasionally   4. Spinal stenosis: stopped tramadol  but is on naltorxene  4. Weight gain       Plan:   - Take Miralax  daily, starting with one cap per day.  - Adjust Miralax  dosage based on stool consistency, potentially increasing to two caps or decreasing to half a cap as needed.  - Place Miralax  next to morning coffee as a reminder to take it.  - Avoid straining during bowel movements to prevent hernia recurrence.  - Monitor for constipation related to naltrexone  use.    RTC as needed.       Jerry Market, MD  01/30/24     History of present illness:      Cindy Randolph is a pleasant 63 y.o. Caucasian female with PMH of left breast cancer s/p mastectomy in 2022, cervical spine radiculopathy, lumbar degenerative disease, osteoporosis, migraine, depression, OSA on CPAP and GERD with hiatal hernia s/p cTIF on June 18, 2023, followed by a dilation procedure in July 2025 for ongoing dysphagia, which alleviated dysphagia. She no longer experiences acid reflux and has been off omeprazole  for about a month after her surgery. Occasionally, she experiences a sensation of food getting 'a little stuck' when eating bread or meat, which she manages by taking smaller bites and chewing better.    She has gained some weight, which she attributes to eating better foods. There is improvement in her constipation, having one to two bowel movements a day, but she desires more ease in her bowel movements. She has not been taking Miralax  regularly.    She has a history of osteoporosis. She has spinal stenosis and occasionally takes ibuprofen and Tylenol  for pain, but has stopped taking tramadol . She is currently on low dose naltrexone , which she notes has an anti-inflammatory effect.    11/2023 EGD with dilation:    The esophagus was normal. A TTS dilator was passed through the scope.        Dilation with an 18-19-20 mm x 8 cm CRE balloon (to a maximum balloon        size of 20 mm) dilator was performed. The dilation site was examined and        showed no change.        The Z-line was found 40 cm from the incisors.        TIF procedure was found in the lower third of the esophagus.        The stomach was normal.  The examined duodenum was normal.     Vitals:    01/30/24 1007   BP: 123/75   Pulse: 67   Temp: 36.1 ?C (97 ?F)   Resp: 20   SpO2: 100%       Physical Exam:   GENERAL:   Alert and oriented x 3, not in acute distress, looking stated age.  HEAD:  Normocephalic, atraumatic.  EYES: PERRLA, EOM intact, no conjunctivitis or jaundice.  ENT:  Oropharynx is clear, no thrush or oral ulcer, no nasal discharge.  NECK:  Supple, no thyromegaly or bruit.  SKIN: No rash, ulcer, erythema, spider nevi or jaundice.  LYMPH NODES:  No lymphadenopathy in cervical, axillary or inguinal.  LUNGS:  Symmetric expansion, clear to auscultation bilaterally, no crackles or wheezing.  HEART: Regular rate and rhythm, no murmur or gallop.  ABDOMEN:  Soft, non-tender, non-distended, no costovertebral angle tenderness, no rebound or guarding, no hepatosplenomegaly, no shifting dullness, bowel sounds are normoactive in all four quadrants.  RECTAL EXAM:  Not performed.  EXTREMITIES:  No cyanosis, clubbing or edema.  NEURO:  Moves all 4 extremities without difficulty, gait is appropriate.  PSYCH:   Appropriate mood and behavior.

## 2024-01-31 ENCOUNTER — Encounter: Admit: 2024-01-31 | Discharge: 2024-01-31 | Payer: MEDICARE

## 2024-02-02 ENCOUNTER — Encounter: Admit: 2024-02-02 | Discharge: 2024-02-02 | Payer: MEDICARE

## 2024-02-03 ENCOUNTER — Encounter: Admit: 2024-02-03 | Discharge: 2024-02-03 | Payer: MEDICARE

## 2024-02-06 ENCOUNTER — Encounter: Admit: 2024-02-06 | Discharge: 2024-02-06 | Payer: MEDICARE

## 2024-02-08 ENCOUNTER — Encounter: Admit: 2024-02-08 | Discharge: 2024-02-08 | Payer: MEDICARE

## 2024-02-08 NOTE — Progress Notes
 Radiation Oncology Follow Up Note  Date: 02/09/2024       Cindy Randolph is a 63 y.o. female.     The primary encounter diagnosis was Malignant neoplasm of central portion of left breast in female, estrogen receptor positive . A diagnosis of Radiotherapy follow-up was also pertinent to this visit.  Staging:  Cancer Staging   Malignant neoplasm of left breast in female, estrogen receptor positive (CMS-HCC)  Staging form: Breast, AJCC 8th Edition  - Clinical stage from 12/22/2020: Stage IA (cT1c, cN0, cM0, G1, ER+, PR+, HER2-) - Signed by Raejean Blane SAUNDERS, MD on 12/22/2020  - Pathologic stage from 02/02/2021: Stage IIIA (pT3, pN3, cM0, G1, ER+, PR+, HER2-) - Signed by Raejean Blane SAUNDERS, MD on 05/27/2021      History of Present Illness  Treating Physicians: Drs. Raejean Mount     Diagnosis:   63 y.o. female with cT1cN0 ILC of left breast (ER+ HER2- with Ki-67 of 2-5%) status post bilateral mastectomy and L SLNB. Pathology showed 7.4 cm grade 1 ILC, resected with ideal margins, negative LVSI, 5/5 sentinel lymph nodes involved (largest metastatic deposit measuring 1.8 cm with positive ECE). She completed adjuvant ddAC then had left ALND for which pathology revealed 6/14 positive lymph nodes (total of 11/19 positive lymph nodes). Stage mpT3N3a. She was referred for a discussion regarding adjuvant radiotherapy.      Treatment History:   Left CW + Nodes (3D): 06/10/21 - 07/01/21  266 cGy in 16 fractions. 4256 cGy in total.       07/01/2021   Course ID C1-Left CW   Plan ID Lt [id number]   Prescribed Dose per Fraction 2.66   Fractions Treated to Date 16   Total Fractions on Plan 16   Reference Point ID Lt Sclav   Session Dosage Given 2.66   First Treatment Date 06-10-2021 02:17PM   Last Treatment Date 07-01-2021 10:36AM   Treatment Elapsed Days 21      Trial: IIT Hypofractionated radiation therapy for patients with breast cancer receiving regional nodal irradiation.     Interruptions: No     RX during treatment: SSD, Magic Mouthwash, and Tramadol       Patient underwent adjuvant radiotherapy to her left breast to 4256 cGy in 16 fractions using 3D conformal radiation treatment planning. She tolerated treatment well and had no treatment interruptions. As expected, she had odynophagia, mild erythema of treatment area without desquamation. She was prescribed Magic Mouthwash, SSD cream  and 2.5% hydrocortisone cream.      Subjective:          Cindy Randolph returns today for routine follow-up. She is unaccompanied to today's visit. Doing okay. Is dealing with a lot of orthopedic ailments. She does need a lumbar fusion for chronic back issues. She is receiving new treatment for osteoporosis in preparation for this. She also has chronic neck pain which contributes to mild, intermittent headaches. She experiences occasional shooting/creepy crawly pains in her left chest wall. No tightness of her her chest wall or shoulder. She occasionally experiences a sensation of not getting in enough air. She does not necessarily feel SOA or like she can't take a deep breath. This generally occurs after she has been exerting herself though starts after she has initially recovered. This sensation improves with rest though may take up to several hours. She denies significant cough, CP or fevers. No hx of asthma or other lung disease though notes significant second hand smoke exposure as a child. Energy levels are okay; seem  to have improved overall. She is trying to walk more; both for energy purposes and also to prepare for recovery from upcoming back surgery.            Review of Systems   Constitutional:  Positive for fatigue. Negative for fever.   HENT:  Negative for sore throat and trouble swallowing.    Eyes:  Negative for visual disturbance.   Respiratory:  Negative for cough, shortness of breath and wheezing.    Cardiovascular:  Negative for chest pain.   Musculoskeletal:  Positive for arthralgias, back pain and neck pain. Negative for myalgias.   Skin: Negative for color change.   Neurological:  Positive for headaches. Negative for dizziness, weakness and light-headedness.   Hematological:  Negative for adenopathy.   Psychiatric/Behavioral:  The patient is not nervous/anxious.    All other systems reviewed and are negative.      Objective:          acetaminophen  (TYLENOL ) 325 mg tablet Take two tablets by mouth every 6 hours. Take scheduled for 3 days after surgery, then as needed. Do not exceed 4,000mg  in a 24 hour period. (Patient taking differently: Take two tablets by mouth as Needed. Take scheduled for 3 days after surgery, then as needed. Do not exceed 4,000mg  in a 24 hour period.)    amLODIPine  (NORVASC ) 5 mg tablet Take one tablet by mouth daily.    aspirin  81 mg chewable tablet Chew one tablet by mouth daily.    biotin 1 mg cap Take one capsule by mouth daily.    calcium  carbonate (OS-CAL) 1250 mg (500 mg elemental calcium ) tablet Take one tablet by mouth daily.    CHOLEcalciferoL (vitamin D3) 5000 unit tablet Take one tablet by mouth daily.    cloNIDine HCL (CATAPRES) 0.1 mg tablet Take one tablet by mouth as Needed.    diclofenac  sodium (VOLTAREN ) 1 % topical gel Apply four g topically to affected area daily as needed.    ezetimibe  (ZETIA ) 10 mg tablet Take one tablet by mouth daily.    Fish,Bora,Flax Oils-OM3,6,9 #1 (TRIPLE OMEGA) 400-400-400 mg capsule Take one capsule by mouth at bedtime daily.    hydroxychloroquine (PLAQUENIL) 200 mg tablet Take with food. Take 200mg  daily for the first week, then take 400mg  daily for six days a week (Mon-Sat) and 200mg  daily for one day a week (Sundays) there after.    inclisiran (LEQVIO ) 284 mg/1.5 mL injection syringe Inject 1.5 mL under the skin every 180 days. Initially then at 3 months and then every 6 months    iron  dextran complex (INFED  IJ) Inject  to area(s) as directed once.    metoprolol  succinate XL (TOPROL  XL) 50 mg extended release tablet Take one tablet by mouth at bedtime daily.    Miscellaneous Medical Supply misc DME requirements, rx to include the following typed information:    NPI: [id number]  Doctor's typed name: [name], MD  ICD 10: G47.33  Length of need: 99 months  Start date:02/14/2023  Signature date: 02/14/2023  Detailed equipment: CPAP machine with any accessories.    naltrexone  4.5 MG oral capsule (COMPOUND) Take one capsule by mouth daily.    other medication Take one Dose by mouth daily. K Complete K1 & K2 as MK-4 & MK-7 : Take 1 softgel by mouth once daily    Selenium 100 mcg tab Take one tablet by mouth daily.    SUMAtriptan  succinate (IMITREX ) 25 mg tablet Take one tablet by mouth at  onset of headache. May repeat after 2 hours if needed. Max of 200 mg in 24 hours. (Patient taking differently: as Needed for Migraine symptoms. Take one tablet by mouth at onset of headache. May repeat after 2 hours if needed. Max of 200 mg in 24 hours.)    SYNTHROID  100 mcg tablet Take one tablet by mouth daily 30 minutes before breakfast.    tiZANidine (ZANAFLEX) 2 mg tablet Take one tablet by mouth twice daily as needed.    tretinoin  (RETIN-A ) 0.025 % topical cream Apply  topically to affected area at bedtime daily.    vit A/vit C/vit E/zinc/copper (PRESERVISION AREDS PO) Take 1 tablet by mouth daily.    vitamins, B complex tab Take one tablet by mouth daily.    vitamins, multiple cap Take one capsule by mouth daily.     Vitals:    02/09/24 0945   BP: 136/88   BP Source: Arm, Right Upper   Pulse: 73   Temp: 36.5 ?C (97.7 ?F)   Resp: 17   SpO2: 97%   PainSc: Two   Weight: 73 kg (161 lb)     Body mass index is 28.98 kg/m?[name]     Pain Score: Two        Fatigue Scale: 4    KARNOFSKY PERFORMANCE SCORE:  90% Able to carry on normal activity; minor signs of disease     Physical Exam  Vitals reviewed.   Constitutional:       General: She is not in acute distress.     Appearance: Normal appearance. She is well-developed. She is not ill-appearing.   HENT:      Head: Normocephalic and atraumatic. Eyes:      General: Lids are normal.      Extraocular Movements: Extraocular movements intact.      Conjunctiva/sclera: Conjunctivae normal.   Cardiovascular:      Rate and Rhythm: Normal rate and regular rhythm.      Heart sounds: Normal heart sounds.   Pulmonary:      Effort: Pulmonary effort is normal.      Breath sounds: Normal breath sounds.   Chest:   Breasts:     Right: Absent.      Left: Absent.      Comments: S/p bilateral mastectomy. Telangectasia of left anterior chest wall otherwise no notable abnormalities.    Musculoskeletal:      Cervical back: Neck supple.   Lymphadenopathy:      Cervical: No cervical adenopathy.      Upper Body:      Right upper body: No supraclavicular or axillary adenopathy.      Left upper body: No supraclavicular or axillary adenopathy.   Skin:     General: Skin is warm and dry.      Coloration: Skin is not pale.   Neurological:      Mental Status: She is alert and oriented to person, place, and time. Mental status is at baseline.      Cranial Nerves: Cranial nerves 2-12 are intact.      Sensory: Sensation is intact.      Motor: Motor function is intact.   Psychiatric:         Attention and Perception: Attention normal.         Mood and Affect: Mood and affect normal.         Speech: Speech normal.         Behavior: Behavior normal.  Cognition and Memory: Cognition and memory normal.            Laboratory:    Comprehensive Metabolic Profile    Lab Results   Component Value Date/Time    NA 139 11/24/2023 03:31 PM    K 3.9 11/24/2023 03:31 PM    CL 104 11/24/2023 03:31 PM    CO2 26 11/24/2023 03:31 PM    GAP 9 11/24/2023 03:31 PM    BUN 15 11/24/2023 03:31 PM    CR 0.88 11/24/2023 03:31 PM    GLU 123 (H) 11/24/2023 03:31 PM    Lab Results   Component Value Date/Time    CA 8.8 11/24/2023 03:31 PM    PO4 3.6 12/19/2023 02:09 PM    ALBUMIN 4.5 11/24/2023 03:31 PM    TOTPROT 7.1 12/19/2023 02:09 PM    ALKPHOS 51 11/24/2023 03:31 PM    AST 30 12/07/2023 08:19 AM    ALT 30 12/07/2023 08:19 AM    TOTBILI 0.3 11/24/2023 03:31 PM    GFR >60 11/24/2023 03:31 PM        CBC w diff    Lab Results   Component Value Date/Time    WBC 11.60 (H) 11/24/2023 03:31 PM    RBC 4.34 11/24/2023 03:31 PM    HGB 13.6 11/24/2023 03:31 PM    HCT 40.4 11/24/2023 03:31 PM    MCV 93.2 11/24/2023 03:31 PM    MCH 31.5 11/24/2023 03:31 PM    MCHC 33.8 11/24/2023 03:31 PM    RDW 14.3 11/24/2023 03:31 PM    PLTCT 180 11/24/2023 03:31 PM    MPV 8.0 11/24/2023 03:31 PM    Lab Results   Component Value Date/Time    NEUT 78.2 (H) 11/24/2023 03:31 PM    ANC 9.10 (H) 11/24/2023 03:31 PM    LYMA 11.9 (L) 11/24/2023 03:31 PM    ALC 1.40 11/24/2023 03:31 PM    MONA 7.9 11/24/2023 03:31 PM    AMC 0.90 (H) 11/24/2023 03:31 PM    EOSA 1.7 11/24/2023 03:31 PM    AEC 0.20 11/24/2023 03:31 PM    BASA 0.3 11/24/2023 03:31 PM    ABC 0.00 11/24/2023 03:31 PM        Imaging:   MRI C-SPINE EXTERNAL IMAGING  This order has been auto finalized and does not contain a result.       Path:  PATHOLOGY REPORT   Date Value Ref Range Status   03/24/2022   Final    [facility]  [email or web address]    Department of Pathology and Laboratory Medicine  [address] [place], [place] [address]  Surgical Pathology Office:  [phone]  Fax:  [phone]  SURGICAL PATHOLOGY REPORT    NAME: AUTRY, DROEGE. SURG PATH #: [id number] MR #: [id number] SPECIMEN  CLASS: SI BILLING #: [id number] ALT ID #:  LOCATION: [id number] DATE OF  PROCEDURE: 03/24/2022 AGE:  61 SEX: F DATE RECEIVED: 03/24/2022 DOB:  1960-09-03  TIME RECEIVED:  09:57 PHYSICIAN: TINNIE FORBES MOUNT, MD DATE OF  REPORT: 03/26/2022 COPY TO: CONCETTA KATHEE SHARPS, DO DATE OF PRINTING: 03/26/2022         ########################################################################  Final Diagnosis:    A. Lymph node, right axilla, biopsy:    Fragments of lymph node, negative for carcinoma. See comment.    Comment:  Immunohistochemical stain for pancytokeratin (performed on block A1) is  negative. Deeper levels were utilized in evaluation, supporting above  diagnosis.    Pursuant  to the Quality Assurance Program at the [facility] Pathology Department, selected slides from this case have been  concurrently reviewed by the following pathologist: Dr. Leonarda who agrees  with the final diagnosis.       Attestation:  By this signature, I attest that I have personally formulated the final  interpretation expressed in this report and that the above diagnosis is  based upon my examination of the slides and/or other material indicated in  this report.    +++Electronically Signed Out By FAE LOISE LAWLESS, MD on 03/26/2022+++  Jackolyn Hammans, DO, Resident                 pq/03/25/2022             ########################################################################  Material Received:  A: right axilla    History:  63 year old patient with a history of metastatic left invasive lobular  carcinoma status-post mastectomy.         Gross Description:  A. Received in formalin labeled right axilla is a 1.4 x 0.8 x 0.2 cm  aggregate of cylindrical yellow-tan, bloodstained, fatty tissue fragments.  The specimen is entirely submitted in A1. The breast biopsy is removed  from the patient at 08:22 on 03/24/2022, placed in formalin at 08:27 on  03/24/2022, and not removed from formalin until 23:40 on 03/24/2022. (jm)    jm/03/24/2022           If immunohistochemical stains and/or in situ hybridization are cited in  this report, the performance characteristics were determined by the  Department of Pathology and Laboratory Medicine of the [facility] of  [place]  [facility] Pathology Association) in compliance with CLIA'88  regulations. Some of these tests rely on the use of analyte specific  reagents and are subject to specific labeling requirements by the FDA.  The stains are performed on formalin-fixed, paraffin-embedded tissue,  unless otherwise stated. Known positive and negative control tissues  demonstrate appropriate staining. Results should be interpreted with  caution given the likelihood of false negativity on decalcified specimens.  This testing was developed by the Department of Pathology and Laboratory  Medicine of the [facility] of  . It has not been cleared or approved  by the FDA.  The FDA has determined that such clearance or approval is not  necessary.    Testing performed by the [facility] of   Health System at [address], [place], [place] [address]. CLIA # [id number].     ]        Assessment and Plan:  Cindy Randolph is a 63 y.o. female with cT1cN0 ILC of left breast (ER+ HER2- with Ki-67 of 2-5%) status post bilateral mastectomy and L SLNB. Pathology showed 7.4 cm grade 1 ILC, resected with ideal margins, negative LVSI, 5/5 sentinel lymph nodes involved (largest metastatic deposit measuring 1.8 cm with positive ECE). She completed adjuvant ddAC then had left ALND for which pathology revealed 6/14 positive lymph nodes (total of 11/19 positive lymph nodes). Stage mpT3N3a. She enrolled in the IIT Hypofractionation clinical trial and received adjuvant radiotherapy to her left breast to 4256 cGy in 16 fractions using 3D conformal radiation treatment planning during 06/10/21 - 07/01/21.      - Cindy Randolph was seen today for routine follow-up after completion of radiation therapy.  At this time, she is clinically stable with no clinical evidence of disease recurrence or progression.  She has no subacute toxicities from having undergone adjuvant radiation.  - Our plan will be for the  patient to return to our clinic in 1 year for continued follow-up.  She may contact us  in the interim if there are any questions or concerns requiring earlier assessment.  -The patient will keep their appointments with their other managing providers including medical oncology, Dr. Isidoro. She is no longer on an AI.            Total time for today's visit was 26 minutes. Visit time spent on the following: preparing to see the patient, obtaining and/or reviewing separately obtained history/information, performing a physical examination and/or evaluation, counseling and educating the patient/family/caregiver, referring to and communication with other health care professionals, documenting clinical information in the electronic or other health record and care coordination.     Elida Batter [facility] - Radiation Oncology  [address]  [place], [place] [address]    Collaborating Physician: Elveria Crazier, MD    Parts of this note were created using voice recognition software.  Please excuse any grammatical or typographical errors.

## 2024-02-09 ENCOUNTER — Ambulatory Visit: Admit: 2024-02-09 | Discharge: 2024-02-09 | Payer: MEDICARE

## 2024-02-09 ENCOUNTER — Encounter: Admit: 2024-02-09 | Discharge: 2024-02-09 | Payer: MEDICARE

## 2024-02-09 VITALS — BP 136/88 | HR 73 | Temp 97.70000°F | Resp 17 | Wt 161.0 lb

## 2024-02-09 DIAGNOSIS — C50112 Malignant neoplasm of central portion of left female breast: Principal | ICD-10-CM

## 2024-02-09 DIAGNOSIS — Z09 Encounter for follow-up examination after completed treatment for conditions other than malignant neoplasm: Secondary | ICD-10-CM

## 2024-02-10 ENCOUNTER — Encounter: Admit: 2024-02-10 | Discharge: 2024-02-10 | Payer: MEDICARE

## 2024-02-13 ENCOUNTER — Encounter: Admit: 2024-02-13 | Discharge: 2024-02-13 | Payer: MEDICARE

## 2024-02-16 ENCOUNTER — Encounter: Admit: 2024-02-16 | Discharge: 2024-02-16 | Payer: MEDICARE

## 2024-02-17 ENCOUNTER — Encounter: Admit: 2024-02-17 | Discharge: 2024-02-17 | Payer: MEDICARE

## 2024-02-17 ENCOUNTER — Ambulatory Visit: Admit: 2024-02-17 | Discharge: 2024-02-17 | Payer: MEDICARE

## 2024-02-17 VITALS — HR 68 | Temp 97.90000°F | Wt 161.0 lb

## 2024-02-17 DIAGNOSIS — E611 Iron deficiency: Principal | ICD-10-CM

## 2024-02-17 MED ORDER — ROMOSOZUMAB-AQQG 105 MG/1.17 ML SC SYRG
210 mg | Freq: Once | SUBCUTANEOUS | 0 refills
Start: 2024-02-17 — End: ?

## 2024-02-17 MED ORDER — ROMOSOZUMAB-AQQG 105 MG/1.17 ML SC SYRG
210 mg | Freq: Once | SUBCUTANEOUS | 0 refills | Status: CP
Start: 2024-02-17 — End: ?
  Administered 2024-02-17: 17:00:00 210 mg via SUBCUTANEOUS

## 2024-02-22 ENCOUNTER — Encounter: Admit: 2024-02-22 | Discharge: 2024-02-22 | Payer: MEDICARE

## 2024-03-01 ENCOUNTER — Encounter: Admit: 2024-03-01 | Discharge: 2024-03-01 | Payer: MEDICARE

## 2024-03-01 NOTE — Telephone Encounter
 Purple thumb, twice in a month  (Newest Message First)             Othel Earnie Levorn Sheril to [facility] (supporting [facility] Sherrod Chandler, MD) (Selected Message)        03/01/24  4:45 PM  Color more accurate in this attached photo--   Attachments   [email or web address]  Othel Earnie Levorn Sheril to [facility] (supporting [facility] Sherrod Chandler, MD)        03/01/24  4:42 PM  Hi Dr. Stephanie,   I have an upcoming appointment on 10/15. Just today, and about a month ago, my thumb was suddenly bruised and swollen at the distal phalanx. It took a week to resolve the first time; all I?d done was open a bottle. It resolved in about a week. Today I transplanted a few flowers with a small trowel and looked down to see it more swollen and bruised than the first time. 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 Should I get it xrayed? It doesn?t feel as tight as it did a couple hours ago. It doesn?t hurt anymore more than usual. 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 Since it?s the second time I?m stumped. I stopped my baby aspirin  a couple weeks ago when it happened the first time.  I am on Evenity  injections for osteoporosis.  Thank you,  [name]   Attachments   [id number]     [id number]     [id number]

## 2024-03-01 NOTE — Telephone Encounter
 Patient calling back because she saw in her MyChart that she needs to be seen within 3 days.  Patient triaged today by RN at [phone] and encounter sent to PCP and clinic nurse pool for further care coordination.  Told patient this information and she understands.  Advised patient to call back with any questions or concerns.

## 2024-03-01 NOTE — Telephone Encounter
 Reason for Conversation  No chief complaint on file.    Background   Pt got another bruise on underneath side of thumb from gardening today.  2 weeks ago the same thumb bruised easily from opening a bottle.  Pt states no pain, just arthritis type of pain.  Pt states she stopped taking her baby aspirin  2 weeks ago when first bruise appeared.  She is wondering if xray or labs are appropriate.  She has previously scheduled office visit with Dr. Sherrod Chandler on 10/15.  Pt requests further advisement on whether she should wait until that appointment or if PCP wants to make recommendations sooner.  Message routed to PCP and care team for further advisement.  Patient instructed if new, concerning or worsening symptoms occur to seek immediate evaluation at closest [facility].       Disposition   SEE PCP WITHIN 3 DAYS    Reason for Disposition      Less than 5 unxplained bruises now, NOT caused by an injury    1. APPEARANCE of BRUISE: Describe the bruise.       Small purple bruise  2. SIZE: How large is the bruise?         3. NUMBER: How many bruises are there?       1  4. LOCATION: Where is the bruise located?       Bottom of thumb  5. ONSET: How long ago did the bruise occur?       Today while doing some gardening  6. CAUSE: What do you think caused the bruise?      Using a garden tool but not injured by tool  7. MEDICAL HISTORY: Do you have any medical problems that can cause easy bruising or bleeding? (e.g., leukemia, liver disease, recent chemotherapy)      no  8. MEDICINES: Do you take any medicines which thin the blood such as: aspirin , apixaban, heparin , ibuprofen (NSAIDS), Plavix, or Coumadin?      Stopped baby aspirin  2 weeks ago  9. OTHER SYMPTOMS: Do you have any other symptoms?  (e.g., weakness, dizziness, pain, fever, nosebleed, blood in urine/stool)      Pt denies  10. PREGNANCY: Is there any chance you are pregnant? When was your last menstrual period?        N/a    No Additional Information on file.    Protocols Used     Bruises-A-[facility]

## 2024-03-02 ENCOUNTER — Encounter: Admit: 2024-03-02 | Discharge: 2024-03-02 | Payer: MEDICARE

## 2024-03-02 DIAGNOSIS — I10 Essential (primary) hypertension: Principal | ICD-10-CM

## 2024-03-02 NOTE — Telephone Encounter
 Ok to wait until appt next week     Agree if new, concerning or worsening symptoms occur to seek immediate evaluation at closest [facility].     MyChart message sent to patient. Lilia Cook, RN

## 2024-03-02 NOTE — Telephone Encounter
 Black and blue thumb, Photos this morning  (Newest Message First)             Othel Earnie Levorn Sheril to [facility] (supporting [facility] Sherrod Chandler, MD) (Selected Message)        03/02/24  8:52 AM  It is sore but not terribly. Definitely looks worse than it feels, but it?s just so weird. Maybe at least an xray at [place] could R/O spontaneous osteoporotic fx of the tip?  [name]   Attachments   [id number]     [id number]     [id number]

## 2024-03-05 ENCOUNTER — Encounter: Admit: 2024-03-05 | Discharge: 2024-03-05 | Payer: MEDICARE

## 2024-03-06 NOTE — Patient Instructions
 Routine Clinic Information:    Make Checking in faster by using MyChart ahead of time by using the pre check in and complete the questionnaires. This will make your next visit Check in faster.      Please don't hesitate to call if you have any problems or questions.   My nurse, Rayssa Atha, RN, can be reached at [phone].  If she does not answer, please leave a voicemail as she is probably rooming other patients. Please leave your name, the spelling of your last name, date of birth, phone number they can call you back and a description of why you are calling. You may also message us  in MyChart.    Our fax number is [phone].     Medication refills:  Please use the MyChart Refill request or contact your pharmacy directly to request medication refills.  Please allow at least 3 business days for refill requests  .  Lab work:  The main lab is on the 1st floor of the [facility].  [address], [address]  [place], [place] [address]  LAB HOURS  Mon 7 a.m. - 6 p.m.  Tues 7 a.m. - 6 p.m.  Wed 7 a.m. - 6 p.m.  Thur 7 a.m. - 6 p.m.  Fri 7 a.m. - 6 p.m.  Sat 7 a.m. - noon  Sun Closed -  It is walk in only, they do not take appointments:  Other lab locations are available; please see [email or web address]    Test results:  You will receive your test results at your appointment, by MyChart (our patient portal), or via phone or letter.   We prefer to use MyChart as much as possible.  If you are expecting results and have not heard from my office within 2 weeks of your testing, please send a MyChart message or call my office.    As a part of the CARES act, starting 08/23/2019, some results will be released to mychart automatically.  With these changes you may see your results before I do.  Critical lab results will be addressed immediately, but otherwise please  give me 72 hours to view and respond to your results before reaching out with questions. Radiology is on the 2nd floor of the [address], among several other locations.  Please contact the radiology department to schedule at [phone].    Scheduling is available directly through MyChart, or via phone at [phone].  Same day and urgent care appointments are available.    We offer same day appointments for your acute health concerns. These appointments are on a first come, first serve basis. Please call [phone] if you would like to make an appointment.   Appointment reminders may be received over the phone and by text.  Communication preferences can be managed in MyChart to ensure you receive important appointment notifications.    Support for many chronic illnesses is available through [employer]: [employer] or 5081742004.    We offer Integrated Behavioral Health services, nutrition support, and pharmacist support services free of charge.  To schedule an appointment with a specialist and/or testing please call the central scheduling number at [phone].     You may see my nurse practitioner, Tinnie Shock, for urgent needs or if I am unavailable.  We are working as a team to provide better continuity and access to our patients.      If you ever have emergency symptoms of chest pain, shortness of breath or uncontrolled or unexplained pain, please go to  your closest emergency room.  You can give us  an update after you have addressed any emergency.        For urgent issues after business hours/weekends/holidays call [phone] and request for the outpatient internal medicine physician to be paged.

## 2024-03-07 ENCOUNTER — Ambulatory Visit: Admit: 2024-03-07 | Discharge: 2024-03-08 | Payer: MEDICARE

## 2024-03-07 ENCOUNTER — Encounter: Admit: 2024-03-07 | Discharge: 2024-03-07 | Payer: MEDICARE

## 2024-03-07 VITALS — BP 130/95 | HR 72 | Temp 98.20000°F | Resp 16 | Ht 62.0 in | Wt 161.2 lb

## 2024-03-07 DIAGNOSIS — Z0000 Encounter for general adult medical examination without abnormal findings: Secondary | ICD-10-CM

## 2024-03-07 DIAGNOSIS — M810 Age-related osteoporosis without current pathological fracture: Secondary | ICD-10-CM

## 2024-03-07 DIAGNOSIS — M5441 Lumbago with sciatica, right side: Secondary | ICD-10-CM

## 2024-03-07 DIAGNOSIS — I10 Essential (primary) hypertension: Principal | ICD-10-CM

## 2024-03-07 NOTE — Assessment & Plan Note
 Hypertension managed with amlodipine  and Toprol  XL. Elevated BP due to missed dose. Concerns about amlodipine -related ankle swelling.  - Would like to hold off any medications for now  - Continue current antihypertensive regimen.

## 2024-03-07 NOTE — Progress Notes
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 2024    Spinal headache Spring, 2020    After RFA; resolved.    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 diagnosed    Not diagnosed--Unsure--had two concussions with no LOC: at 11 and 31, both from falls from horses    Ulcer 1987    with divorce; resolved    Unspecified deficiency anemia 2009    from excessive bleeding post-partum; treated iron    [2]   Past Surgical History:  Procedure Laterality Date    ACL RECONSTRUCTION  06/03/1999    ARTHROPLASTY  2001    L ACL    BLADDER SURGERY  Nov 2024    Bulkamid procedure by non-[facility] doc    BREAST CYST ASPIRATION  Dec 2000    Neg for cancer, left breast cyst    BREAST SURGERY  01/22/2021    Bilat mastectomy    CARDIAC CATHERIZATION  07/16/2022    Dr. Charlanne Neth, no stent    CARDIOVASCULAR STRESS TEST  Feb 2024    At [facility]    COLONOSCOPY  2017    normal    COLONOSCOPY N/A 10/14/2021    COLONOSCOPY DIAGNOSTIC WITH SPECIMEN COLLECTION BY BRUSHING/ WASHING - FLEXIBLE performed by Gladis Morene HERO, MD at [facility] OR    ECHOCARDIOGRAM PROCEDURE  2022    See oncology history    ELECTROCARDIOGRAM  06/17/22    Most recent 12 lead done post egd    ESOPHAGEAL DILATATION N/A 06/17/2022    ESOPHAGOGASTRODUODENOSCOPY WITH DILATION ESOPHAGUS WITH BALLOON 30 MM OR GREATER - FLEXIBLE performed by Chipper Neas, MD at [facility] ENDO    ESOPHAGEAL MOTILITY STUDY N/A 02/08/2023    ESOPHAGEAL MOTILITY STUDY performed by Ledora Catalina, MD at [facility] ENDO    ESOPHAGUS SURGERY  Summer 2025    Dilation    EVENT MONITOR  February 2024    FRACTURE SURGERY  1993    ORIF L 1st metacarpal    HERNIA REPAIR N/A 06/15/2023    LAPAROSCOPIC REPAIR PARAESOPHAGEAL HERNIA WITHOUT FUNDOPLASTY performed by Jacqulyn Delon BIRCH, MD at [facility] OR    HERNIA REPAIR N/A 06/15/2023    LAPAROSCOPIC REPAIR PARAESOPHAGEAL HERNIA WITH FUNDOPLASTYUSING TIF DEVICE performed by Ledora Catalina, MD at [facility] OR    HX BREAST BIOPSY  2022    See [place] records positive for lobular    HX CARPAL TUNNEL RELEASE  1996    With second and other oregbnancies    HX EPISIOTOMY  1994    Healed well    HX HEART CATHETERIZATION  Feb 2024 dx    No stents. (2) 50% blockages LAD    HX MASTECTOMY  09/03/222    Bilateral; cancer in left breast    HX SKIN BIOPSY  2025    found two  BCC on face; both removed by Mohs    HX SURGERY  September 2023    Bilateral mastectomy    HX THYROIDECTOMY  1980    Parathyroids not removed    HX WRIST FRACTURE SURGERY  1993    Baker's thumb with fixation    KNEE ARTHROSCOPY Right 01/08/2021    ARTHROSCOPY KNEE WITH PARTIAL LATERAL MENISCECTOMY AND LEFT KNEE INJECTION. performed by Vopat, Dorise MATSU, MD at [facility] OR    KNEE SURGERY  L ACL as above    LAPAROSCOPY  Bladder April 2025    Knee, bladder    LESION EXCISION Right 01/22/2021    EXCISION BENIGN LESION 0.5 CM OR LESS - TORSO performed by de Elin Rosaline HERO, MD at [facility] OR    LYMPH NODE BIOPSY Left 01/22/2021    LEFT AXILLARY SENTINEL LYMPH NODE BIOPSY performed by Raejean Blane SAUNDERS, MD at [facility] OR    LYMPH NODE DISSECTION  12/29/222    Left    LYMPHADENECTOMY Left 05/14/2021    Left Completion Axillary Lymph Node Dissection performed by Raejean Blane SAUNDERS, MD at [facility] OR    MASTECTOMY Bilateral 01/22/2021    BILATERAL TOTAL MASTECTOMIES performed by Raejean Blane SAUNDERS, MD at [facility] OR    PORTACATH PLACEMENT  02/05/2021    Date approximate; for chemo, right. Removed 04/2021    PR LAPAROSCOPY SURG RPR INITIAL INGUINAL HERNIA  Jun 14, 2023    Esophageal    SPINE SURGERY  03/2023    MILD at [place], Dr. Velinda Cordia    STOMACH SURGERY  Jan 2025    cTIF    SURGERY  Winter/[place] 2025    MOS procedure x2 face    THYROIDECTOMY  1980    TUNNELED VENOUS PORT PLACEMENT Right 02/19/2021    For chemo; Removed a few months later    TUNNELED VENOUS PORT REMOVAL Right 05/14/2021    REMOVAL TUNNELED CENTRAL VENOUS ACCESS DEVICE INCLUDING PORT/ PUMP performed by Raejean Blane SAUNDERS, MD at [facility] OR    UMBILICAL ARTERIAL CATH - BEDSIDE  February, 2024 at [place]    Mild CAD    UPPER GASTROINTESTINAL ENDOSCOPY N/A 06/17/2022    ESOPHAGOGASTRODUODENOSCOPY WITH SPECIMEN COLLECTION BY BRUSHING/ WASHING performed by Chipper Neas, MD at [facility] ENDO    UPPER GASTROINTESTINAL ENDOSCOPY N/A 02/08/2023    ESOPHAGOGASTRODUODENOSCOPY WITH BIOPSY - FLEXIBLE performed by Ledora Catalina, MD at [facility] ENDO    UPPER GASTROINTESTINAL ENDOSCOPY N/A 02/08/2023    ESOPHAGOGASTRODUODENOSCOPY WITH DILATION GASTRIC/ DUODENAL STRICTURE - FLEXIBLE performed by Ledora Catalina, MD at [facility] ENDO    UPPER GASTROINTESTINAL ENDOSCOPY N/A 06/15/2023    ESOPHAGOGASTRODUODENOSCOPY WITH SPECIMEN COLLECTION BY BRUSHING/ WASHING performed by Ledora Catalina, MD at [facility] OR    WOUND REPAIR Bilateral 01/22/2021    BILATERAL CHEST FLAT CLOSURE performed by everitt Elin Rosaline HERO, MD at [facility] OR    WOUND REPAIR Bilateral 01/22/2021    BILATERAL CHEST FLAT CLOSURE x 8 performed by de Elin Rosaline HERO, MD at [facility] OR   [3]   Family History  Problem Relation Age of Onset    Arthritis Paternal Grandmother         multiple heberdens nodules and deformities    Back pain Paternal Grandmother     Arthritis-osteo Paternal Grandmother     Diabetes Paternal Grandmother         Type 2 as older adult    Arthritis Mother         wear and tear  Back pain Mother     Hypertension Mother     Joint Pain Mother     Neck Pain Mother     Cancer-Breast Mother 66        was on HRT for 10-15 years    Cancer Mother         Breast CA, post menopausal, estrogen-sensitive    Miscarriages Mother         3-5 and a stillbirth    Basal Cell Carcinoma Brother     Back pain Brother         Has had multiple laminectomy surgeries    Hypertension Brother     Asthma Brother         Worst when a child    Diabetes Brother         Non-insulin dependent, 2022    Basal Cell Carcinoma Brother     Back pain Brother         Arthritis    Hypertension Brother     Diabetes Brother         Non-insulin dependent, 2023    Basal Cell Carcinoma Brother     Back pain Brother         Arthritis    Hypertension Brother     Heart problem Brother         Stent in LAD, positive apo(a)    Diabetes Brother         Non insulin dependent, 2023    Diabetes Father         Type 2    Heart problem Father         CABG 3    Heart Disease Father         Occult MI in early 74s; later found with CAD; subsequent Triple CABG. In hos 60s started amiodarone    Hypertension Father     Alcohol abuse Father     Early Death Father         Age 46, complications diabetes and heart disease    Diabetes Maternal Grandfather         Type 2 as older adult    Hypertension Maternal Grandfather         I remember he was on a severe salt-restricted diet    Birth Defect Daughter         PRS, congenital diaphragmatic hernia, malrotation, grey matter heterotopia    Stroke Maternal Uncle         In his 91s    Thyroid Disease Maternal Uncle     Diabetes Paternal Grandfather     Birth Defect Nephew         craniosynostosis    Thyroid Disease Maternal Uncle         Goiter    Cancer-Breast Maternal Great-Aunt 49    Cancer Other         Mother?s maternal aunt    Cancer Brother         Prostate, Stage 2    High Cholesterol Brother         High apolipoprotein little a    Coronary Artery Disease Father         Had triple CABG    Cancer-Skin Brother Removed    Cancer Other         Mother?s maternal aunt    Stroke Brother         Found in retrospect; micro    Thyroid Disease Other  Mother?s brother; Hx goiter    Miscarriage Mother         Multiple, and one stillbirth    Miscarriage Daughter         Has PCOS, and first pregnancy ended spontaneously at 8 weeks. Since then, 2 heathy pregnancies/deliveries/children    Sudden Cardiac Death Father         61; was recently put on amiodarone    Heart Attack Father         76    Growth/Development Disorders Daughter         Since birth    Scoliosis Daughter         Birth    ADD/ADHD Brother         6    Scoliosis Daughter     Dementia Maternal Grandmother         63    ADD/ADHD Son         5    Bipolar Disorder Son         56    ADD/ADHD Son         6    ADD/ADHD Son         4    ADD/ADHD Daughter         24   [4]   Allergies  Allergen Reactions    Mango ANAPHYLAXIS    Other [Unclassified Drug] ANAPHYLAXIS     DUCK Meat and Eggs     Imdur  [Isosorbide  Mononitrate] CHEST TIGHTNESS and RASH    Iodinated Contrast Media RASH and ITCHING     04/2023 pt developed throat itching after IV Contrast in CT, itching subsided after 5 minutes without intervention.   She developed rash on back and neck after hospitalization 06/2022 where she underwent heart cath. Unsure if contrast was   cause of rash, but it developed shortly after procedure.    Macrobid  [Nitrofurantoin  Monohyd/M-Cryst] SEE COMMENTS     Patient has pulmonary fibrosis so this antibiotic is not preferred unless this is the only medication available .     Oxycodone  NAUSEA ONLY     Prefers tramadol     Oxycodone -Acetaminophen  NAUSEA ONLY    Sudafed [Pseudoephedrine Hcl] PALPITATIONS

## 2024-03-07 NOTE — Assessment & Plan Note
 Managed with [facility]   Managed by oncology

## 2024-03-07 NOTE — Assessment & Plan Note
 Is scheduled for lumbar fusion at [facility]  Continue management per their recommendations

## 2024-03-08 ENCOUNTER — Encounter: Admit: 2024-03-08 | Discharge: 2024-03-08 | Payer: MEDICARE

## 2024-03-08 NOTE — Progress Notes
 Attempted to contact patient for routine ccm call left vm message requesting return call back.

## 2024-03-13 ENCOUNTER — Ambulatory Visit: Admit: 2024-03-13 | Discharge: 2024-03-13 | Payer: MEDICARE

## 2024-03-13 ENCOUNTER — Encounter: Admit: 2024-03-13 | Discharge: 2024-03-13 | Payer: MEDICARE

## 2024-03-13 VITALS — Ht 62.0 in | Wt 161.0 lb

## 2024-03-13 DIAGNOSIS — L570 Actinic keratosis: Principal | ICD-10-CM

## 2024-03-13 DIAGNOSIS — Z4889 Encounter for other specified surgical aftercare: Secondary | ICD-10-CM

## 2024-03-13 NOTE — Progress Notes
 DERMATOLOGIC SURGERY F/U NOTE    CC: Scar evaluation    HPI: Cindy Randolph is a 63 y.o. female who presents for follow-up of scar s/p Mohs surgery with complex repair on the left cheek 09/14/23. Pt reports bump in scar.    ROS: Patient denies any other skin complaints    PE:   Constitutional: Patient is well appearing in no acute distress.  Mood/affect are pleasant and appropriate. Alert and oriented to person, place, time.    - well-healing surgical scar   - x2 thin scaly papule of right perinasal cheek adjacent to scar x1 and right suprabrow x1    A/P:    # Scar s/p Mohs micrographic surgery  - Well-healing scar  - Reviewed natural course of wound healing  - Reviewed scar management. Recommend scar massage daily x 5-10 minutes to help finalize healing.  - Patient informed to call the office if any issues were to arise in the future. Recommend photoprotection with at least SPF 30+ sunscreen.  - Patient instructed to continue to follow-up with primary dermatologist for routine skin screenings.    #Actinic Keratosis  -Discussed diagnosis, etiology, and recommended treatment options  -Discussed risks and benefits of LN2 including expected blistering/scabbing with possibility of depigmentation and scarring.  -LN2 -f/t/f x 2 lesions  -Counseled to call for reevaluation if lesions do not resolve  -sunprotection advised    RTC PRN    Liquid Nitrogen Procedure Note    Risk and benefits of the above procedure including pain, dyspigmentation, scar, infection, recurrence were discussed with the patient (or legal guardian) in detail, who afterwards decided to proceed with the procedure.    Verbal informed consent given  Diagnosis: see progress note  Body site: see progress note  Number of lesions: see progress note  Cycle duration: 10 sec  Number or cycles: 2   Wound care instructions given: Yes  Complications:  None  Tolerated well:  Yes  Ambulated from room:  Yes  Duration of procedure: > 

## 2024-03-13 NOTE — Progress Notes
 ATTESTATION    I personally performed the key portions of the E/M visit, discussed case with resident and concur with resident documentation of history, physical exam, assessment, and treatment plan unless otherwise noted. I performed cryotherapy today .Patient informed that a blistering reaction is to be expected and that a hypopigmented scar may result. Patient tolerated the procedure well with no complications.     Staff name:  Hassel Medina MD Date:  03/13/2024

## 2024-03-15 ENCOUNTER — Encounter: Admit: 2024-03-15 | Discharge: 2024-03-15 | Payer: MEDICARE

## 2024-03-16 ENCOUNTER — Encounter: Admit: 2024-03-16 | Discharge: 2024-03-16 | Payer: MEDICARE

## 2024-03-19 ENCOUNTER — Encounter: Admit: 2024-03-19 | Discharge: 2024-03-19 | Payer: MEDICARE

## 2024-03-20 ENCOUNTER — Encounter: Admit: 2024-03-20 | Discharge: 2024-03-20 | Payer: MEDICARE

## 2024-03-20 DIAGNOSIS — 1 ERRONEOUS ENCOUNTER--DISREGARD: Principal | ICD-10-CM

## 2024-03-20 NOTE — Telephone Encounter [[id number]]
 This encounter was created in error. Please disregard.

## 2024-03-20 NOTE — Telephone Encounter [[id number]]
 Hospital Discharge Follow Up      Reached Patient: Yes, patient was identified, for their safety, using dual identification of name and date of birth  Patient Date of Birth: July 18, 1960     Admission Information:     Hospital Name: [facility]  Admission Date: 03/15/24    Discharge Date: 03/19/24    Admission Diagnosis: Spondylolisthesis of lumbar region (Primary Dx)   Discharge Diagnosis: Spondylolisthesis of lumbar region (Primary Dx)   Has there been a discharge within the last 30 days? No   Hospital Services: Planned  Today's call is 1 (business) days post discharge      Discharge Instruction Review   Did patient receive and understand discharge instructions? Yes    Home Health ordered? No  Caregiver assistance in the home? Yes   Are there concerns regarding the patient's ADL'S? No  Is patient a fall risk? No    Special diet? No  Discharge issues and concerns: No barriers      Medication Reconciliation    Changes to pre-hospital medications? Yes    Start Medications:  HYDROcodone -acetaminophen  (Norco) 5-325 MG tablet  Take 1 to 2 tablets by mouth every 4 (four) hours if needed for moderate pain or severe pain. Do not take Tramadol  while taking this medication.    Paused Medications:  BABY ASPIRIN  PO Wait to take this until your doctor or other care  provider tells you to start again. Take 1 tablet by mouth 1 (one) time each day.    Stopped Medications:  traMADol  50 MG tablet (Ultram )     Were new prescriptions filled? Yes  Meds reviewed and reconciled? Yes    Current Outpatient Medications   Medication Instructions    acetaminophen  (TYLENOL ) 650 mg, Oral, EVERY  6 HOURS, Take scheduled for 3 days after surgery, then as needed. Do not exceed 4,000mg  in a 24 hour period.    amLODIPine  (NORVASC ) 5 mg, Oral, DAILY    aspirin  81 mg, Oral, DAILY    biotin 1 mg, DAILY    calcium  carbonate (OS-CAL) 1,250 mg, DAILY    CHOLEcalciferoL (vitamin D3) 5000 unit tablet 1 tablet, DAILY    diclofenac  sodium (VOLTAREN ) 4 g, DAILY  PRN    ezetimibe  (ZETIA ) 10 mg, Oral, DAILY    Fish,Bora,Flax Oils-OM3,6,9 #1 (TRIPLE OMEGA) 400-400-400 mg capsule 1 capsule, AT BEDTIME DAILY    hydroxychloroquine (PLAQUENIL) 200 mg tablet TAKE 2 TABLETS 6 DAYS PER WEEK (MONDAY THROUGH SATURDAY), AND TAKE 1 TABLET ONCE WEEKLY (SUNDAY). TAKE WITH FOOD.    iron  dextran complex (INFED  IJ) ONCE    LEQVIO  284 mg, Subcutaneous, EVERY 180 DAYS (6 MONTHS), Initially then at 3 months and then every 6 months    metoprolol  succinate XL (TOPROL  XL) 50 mg, Oral, AT BEDTIME DAILY    Miscellaneous Medical Supply misc DME requirements, rx to include the following typed information:    NPI: [id number]  Doctor's typed name: [name], MD  ICD 10: G47.33  Length of need: 99 months  Start date:02/14/2023  Signature date: 02/14/2023  Detailed equipment: CPAP machine with any accessories.    naltrexone  4.5 MG oral capsule (COMPOUND) 0.0045 g, Oral, DAILY    other medication 1 Dose, DAILY    Selenium 100 mcg tab 1 tablet, DAILY    SUMAtriptan  succinate (IMITREX ) 25 mg tablet Take one tablet by mouth at onset of headache. May repeat after 2 hours if needed. Max of 200 mg in 24 hours.    SYNTHROID   100 mcg, Oral, DAILY 30MIN BEFORE BREAKFAST    tiZANidine (ZANAFLEX) 2 mg tablet Take one tablet by mouth twice daily as needed.    tretinoin  (RETIN-A ) 0.025 % topical cream Topical, AT BEDTIME DAILY    vit A/vit C/vit E/zinc/copper (PRESERVISION AREDS PO) 1 tablet, DAILY    vitamins, B complex tab 1 tablet, DAILY    vitamins, multiple cap 1 capsule, DAILY       Understanding Condition   Having any current symptoms? Yes, Pain/discomfort and Cindy Randolph reports that she is feeling sore, incision looks good and is approximate, no drainage noted.     Do you have a history of Heart Failure? No    Patient understands when to seek additional medical care? Yes   Other items discussed:            Scheduling Follow-up Appointment   Upcoming appointments:   Future Appointments Date Time Provider Department Center   03/27/2024  3:30 PM Fayette Carlin NOVAK, MD [facility] CVM Exam   04/13/2024 12:00 PM [id number] [facility] None   04/18/2024 10:15 AM CT - [facility] Rad   04/23/2024  1:40 PM Isidoro Tinnie BRAVO, MD [facility] [place] Exam   04/30/2024  8:45 AM Boris Bard LABOR, MD [id number] IM   05/11/2024  8:15 AM [id number] [facility] None   06/19/2024 10:00 AM Susa, Orlean LABOR, APRN-NP [facility] CVM Exam   06/26/2024  1:20 PM Ann Vicenta BROCKS, MD [facility] SPINE   07/20/2024  8:00 AM Genoveva Ruben SQUIBB, MD [facility]   09/05/2024  9:40 AM Sherrod Chandler, Sherrod Glass, MD [facility] IM   02/08/2025 10:00 AM Mardy Elida NOVAK, APRN-NP [facility] Radiati     Does the patient require 7 day follow up appointment? No  Hospital Follow-Up scheduled with PCP? No, Following up with [facility] neurosurgery  When was patient?s last PCP visit: 03/07/2024   PCP primary location: [facility]  Specialist appointment scheduled? Yes, with Neurosurgery at [facility] 03/29/24  Is assistance with transportation needed? No   MyChart message sent? Active in MyChart. No message sent.   Artera text sent? No    [facility], RN

## 2024-03-21 ENCOUNTER — Encounter: Admit: 2024-03-21 | Discharge: 2024-03-21 | Payer: MEDICARE

## 2024-03-27 ENCOUNTER — Encounter: Admit: 2024-03-27 | Discharge: 2024-03-27 | Payer: MEDICARE

## 2024-03-27 ENCOUNTER — Ambulatory Visit: Admit: 2024-03-27 | Discharge: 2024-03-28 | Payer: MEDICARE

## 2024-03-27 VITALS — BP 140/80 | HR 78 | Ht 63.0 in | Wt 161.0 lb

## 2024-03-27 DIAGNOSIS — I2510 Atherosclerotic heart disease of native coronary artery without angina pectoris: Secondary | ICD-10-CM

## 2024-03-27 DIAGNOSIS — Z789 Other specified health status: Secondary | ICD-10-CM

## 2024-03-27 DIAGNOSIS — E7841 Elevated Lipoprotein(a): Secondary | ICD-10-CM

## 2024-03-27 DIAGNOSIS — Z9189 Other specified personal risk factors, not elsewhere classified: Secondary | ICD-10-CM

## 2024-03-27 DIAGNOSIS — R931 Abnormal findings on diagnostic imaging of heart and coronary circulation: Secondary | ICD-10-CM

## 2024-03-27 DIAGNOSIS — E782 Mixed hyperlipidemia: Secondary | ICD-10-CM

## 2024-03-27 DIAGNOSIS — I10 Essential (primary) hypertension: Secondary | ICD-10-CM

## 2024-03-27 DIAGNOSIS — R0989 Other specified symptoms and signs involving the circulatory and respiratory systems: Principal | ICD-10-CM

## 2024-03-27 MED ORDER — METOPROLOL SUCCINATE 50 MG PO TB24
75 mg | ORAL_TABLET | Freq: Every evening | ORAL | 3 refills | 90.00000 days | Status: AC
Start: 2024-03-27 — End: ?

## 2024-03-27 NOTE — Progress Notes [1]
 Date of Service: 03/27/2024  [facility] Cardio-Oncology Clinic Visit  Oncology: Dr. Tinnie Mount  Cindy Randolph is a 63 y.o. female.       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  Last year I was only on 1 dose of Crestor  weekly added Zetia  to her primary prevention program because she had severe statin intolerance.[name]  She was concerned about weight loss and willing to consider a regular exercise program.  In the year since I have seen her she has had no new major adverse cardiovascular events.  She does report intermittent ankle swelling and from discussion with reading she is aware of what she calls amlodipine  ankles.  The swelling is intermittent and not severe but it is new and she wanted to bring it to my attention.    On October 23 she underwent lumbar stabilization surgery which was done at [facility] due to inability to get on the schedule on a timely basis at [facility]. She has had good results and no setbacks from the surgery.       She has a positive family history for premature coronary disease and knows that other family members have elevated lipoprotein a.  With issues about new medications for treating lipoprotein a elevation I referred her to Shirley Verbenec for her counseling about primary preventive techniques for her coronary disease prevention.  She is on the left wheel and tolerated tolerating it well.  Her lipoprotein came back elevated at 132.     Reviewing her LV function shows that her EF was 55% both before and at 1 year after the Adriamycin .    Her last lipid profile in July 2025 showed LDL 91.  She has had lower readings before such as 48 in May 2024 perhaps obtain before she had to reduce the statin dose.         Vitals:    03/27/24 1533   BP: (!) 140/80   BP Source: Arm, Right Upper   Pulse: 78   PainSc: Zero   Weight: 73 kg (161 lb)   Height: 160 cm (5' 3)     Body mass index is 28.52 kg/m?[name]     Past Medical History  Patient Active Problem List    Diagnosis Date Noted    Complex care coordination 09/16/2023     Priority: High     Class: Acute    Anxiety 11/01/2023    Iron  deficiency 09/27/2023    Bilateral temporomandibular joint pain 09/26/2023    Restless leg syndrome 07/25/2023     She does report an itchy, creepy crawly sensation of her legs that keeps her up at night.   Prior to January, it was happening 4-5 times/night.   A few nights, it has been happening every night.   Usually lasts 10-15 minutes or so.   She has tried Gabapentin  and Lyrica  in the past (this was more so for fibromyalgia), but this gave her nightmares.       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   This was at [facility].   AHI 3% 12.9  AHI 4% 7.2  Was titrated to CPAP of 6 cm H2O.       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       Radiation-induced pulmonary fibrosis 06/22/2022    Recurrent major depression in full remission 06/22/2022     Was previously on lexapro  which she tolerated well.      Migraine with aura and without status migrainosus, not intractable 06/22/2022    Chronic pain of multiple joints 06/22/2022     X-ray right hand on 11/11/2022 showed severe degenerative arthritis at the first [facility], STT, and second DIP joints. Radial deviation of the second digit at the DIP joint.       Elevated coronary artery calcium  score 06/17/2022     03/06/2022- CT CAC Score [place] : CAC= 136,  80th percentile rank for females age 60-60. Left main: 0 ,LAD: 109 ,LCX: 27 ,RCA: 0         Hiatal hernia with gastroesophageal reflux 06/17/2022     04/01/2022- Per OV Dr. Debby       Menopausal osteoporosis 07/13/2021    Excess skin of breast 01/15/2021    Malignant neoplasm of left breast in female, estrogen receptor positive (CMS-HCC) 12/22/2020     DIAGNOSIS:  Left grade 1 ILC (ER  96%, PR 10%, HER2 0, Ki-67 3%) at 12:00 dx 11/2020     HISTORY:  Ms. Cindy Randolph is a female who presented to the [facility] on 12/24/2020 at age 89 for left breast cancer.  She reports noticing Left breast intermittent discomfort over the last 2 months.  She had Screening mammogram at [facility] on 11/25/2020 which identified a focal asymmetry in the left breast.  She returned for left diagnostic mammgoram and ultrasound on 12/04/2020 which showed an ill-defined hypoechoic area at 12:00 7 cm FTN measuring 1.7 cm.   She underwent Left ultrasound guided biopsy on 12/10/2020 which revealed grade 1 hormone positive, HER2 negative ILC with associated LCIS. She has no breast complaints.  She proceeded with Bilateral total mastectomy/Left SLNB with oncoplastic flat closure on 01/22/2021.  Final surgical pathology revealed multifocal grade 1 ILC that merge to measuring 7.4 cm with associated LCIS, clear margins and 5/5 lymph nodes. The right breast is benign.   Adjuvant chemotherapy and cALND were recommended.  She completed adjuvant chemotherapy of dd AC from 10/3 to 04/06/2021, taxol  was recommended but she declined.  She returned for Left completion ALND on 05/14/2021.  Final surgical pathology revealed 6/14 lymph nodes positive. She finished radiation with Dr. Buckner on 07/01/21.    BREAST IMAGING:  Mammogram:    - Screening mammogram 11/25/2020 ([facility]) revealed scattered fibroglandular tissue density.  Benign appearing microcalcification. Unchanged right upper central anterior breast low density circumscribed masses dating back to 2013.  Focal asymmetry located within the left posterior central breast, 12:00, 7.3 cm  FTN most conspicuous on MLO.  Recommend spot compression.    - Left diagnostic mammogram 12/04/2020 ([facility]) revealed irregular density at 12:00 left breast persists.  Ultrasound recommended.    Ultrasound:    Left targeted ultrasound 12/04/2020 ([facility]) revealed an ill-defined area which was slightly hypoechoic at 12:00, 7 cm FTN.  This was indeterminate and biopsy was recommended.   MRI:    - Breast MRI 12/26/2020 ([place]) The breast tissue is scattered areas of fibroglandular tissue.   There is mild background parenchymal enhancement. Left breast: There are multiple faint discontiguous irregular enhancing masses and discontiguous intervening nonmass enhancement involving predominantly the upper and inner left breast from anterior to posterior depth measuring in aggregate 6.3 cm AP by 3.6 cm transverse by 6.4 cm craniocaudal. Several areas demonstrate persistent enhancement kinetics. The artifact from tissue marker clips (ribbon and heart) at the site of biopsy demonstrating malignancy are seen within the upper left breast at the 12:00 position, middle depth (image 80). A representative discontiguous irregular enhancing mass is seen within the inner left breast at the 9:00 position, middle depth as on image 111, which demonstrates a mammographic correlate on outside CC tomosynthesis slice 18. Anterior extent of abnormal enhancement extends to the base of the left nipple. Posterior extent of abnormal enhancement is approximately 2.1 cm anterior to the underlying pectoralis muscle. There are greater than 5 small round morphologically abnormal and asymmetric level 1 left axillary lymph nodes. No suspicious level 2 or level 3 lymph nodes are seen. No suspicious internal mammary lymph nodes are seen. Right breast: No suspicious mass or nonmass enhancement is seen within the right breast. No suspicious right axillary or internal mammary lymph nodes are identified. Incidental Findings: None     REPRODUCTIVE HEALTH:  Age at first Menarche:  72  Age at First Live Birth:  16  Age at Menopause:  13  Gravida:  8  Para: 8  Breastfeeding:  yes    PROCEDURE:  Bilateral Total mastectomy/Left SLNB oncoplastic flat closure 01/22/2021 ([facility])  2. Left ALND 05/14/2021 [facility])  PATHOLOGY: multifocal grade 1 ILC that merge to measuring 7.4 cm with associated LCIS, clear margins and 5/5 lymph nodes. The right breast is benign.   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  No family history of ovarian or prostate cancer.   MEDICAL ONCOLOGY:    Dr. Kelby  transferred to Dr. Isidoro Adjuvant chemotherapy:  ddACx 4 completed 04/06/2021, Taxol  recommended but declined;   Present therapy:  Letrozole /Verzenio   REFERRED BY:  Dr. Bernardino Ned      Formatting of this note might be different from the original. Formatting of this note might be different from the original. DIAGNOSIS:  Left grade 1 ILC (ER 91-100, PR 11-20, HER 1+, Ki 67 2-5%) at 12:00 dx 11/2020 HISTORY:  Ms. Cindy Randolph is a female who presented to the [facility] on 12/24/2020 at age 56 for left breast cancer.  She reports noticing Left breast intermittent discomfort over the last 2 months.  She had Screening mammogram at [facility] on 11/25/2020 which identified a focal asymmetry in the left breast.  She returned for left diagnostic mammgoram and ultrasound on 12/04/2020 which showed an ill-defined hypoechoic area at 12:00 7 cm FTN measuring 1.7 cm.   She underwent Left ultrasound guided biopsy on 12/10/2020 which revealed grade 1 hormone positive, HER2 negative ILC with associated LCIS. She has no breast complaints. BREAST IMAGING: Mammogram:   -  Screening mammogram 11/25/2020 ([facility]) revealed scattered fibroglandular tissue density.  Benign appearing microcalcification. Unchanged right upper central anterior breast low density circumscribed masses dating back to 2013.  Focal asymmetry located within the left posterior central breast, 12:00, 7.3 cm FTN most conspicuous on MLO.  Recommend spot compression.   - Left diagnostic mammogram 12/04/2020 ([facility]) revealed irregular density at 12:00 left breast persists.  Ultrasound recommended.  Ultrasound:  Left targeted ultrasound 12/04/2020 ([facility]) revealed an ill-defined area which was slightly hypoechoic at 12:00, 7 cm FTN.  This was indeterminate and biopsy was recommended. MRI:   - Breast MRI 12/26/2020 ([place]) .... REPRODUCTIVE HEALTH: Age at first Menarche:  28 Age at First Live Birth:  46 Age at Menopause:  69 Gravida:  8 Para: 8 Breastfeeding:  yes PROCEDURE:  pending PERTINENT PMH:  Thyroid Cancer (1980- thyroidectomy), HTN FAMILY HISTORY:  Mother- Breast cancer (65).  No family history of ovarian or prostate cancer. PHYSICAL EXAM on PRESENTATION:   MEDICAL ONCOLOGY:    Dr. Kelby REFERRED BY:  Dr. Bernardino Ned      Hypermobility syndrome 05/24/2014    Degenerative disc disease, lumbar 02/21/2014    Primary hypertension 11/13/2007     Hypertension with pregnancy initially but sustained after 8th pregnancy.            Review of Systems   Constitutional: Negative.   HENT: Negative.     Eyes: Negative.    Cardiovascular: Negative.    Respiratory: Negative.     Endocrine: Negative.    Hematologic/Lymphatic: Negative.    Skin: Negative.    Musculoskeletal: Negative.    Gastrointestinal: Negative.    Genitourinary: Negative.    Neurological: Negative.    Psychiatric/Behavioral: Negative.     Allergic/Immunologic: Negative.    All other systems reviewed and are negative.      Physical Exam  General: Patient in no distress, looks generally healthy. Skin warm and dry.    Mucous membranes moist.  Eyes: Sclera non icteric,Pupils equal and round    Carotids: no bruits    Thyroid not enlarged.  Neck veins: CVP <6 normal, no V wave, no HJR     Respiratory: Breathing comfortably. Lungs clear to percussion & auscultation. No rales, rhonchi or wheezing   Cardiac: Regular rhythm. LV impulse not palpable. Normal S1 & S2, Fourth heart sound, no rub or S3. No murmur  Abdomen: soft, non-tender, no masses,bruits,hepatic or aortic enlargement. + bowel sounds.   Femoral arteries: Good pulses, no bruits.  Legs/feet: Normal PT pulses, no edema.   Motor: Normal muscle strength. Cognitive: Pleasant demeanor. Good insight. No depression     Cardiovascular Studies      Cardiovascular Health Factors  Vitals BP Readings from Last 3 Encounters:   03/27/24 (!) 140/80   03/13/24 115/78   03/07/24 (!) 130/95     Wt Readings from Last 3 Encounters:   03/27/24 73 kg (161 lb)   03/13/24 73 kg (161 lb)   03/13/24 73 kg (161 lb)     BMI Readings from Last 3 Encounters:   03/27/24 28.52 kg/m?   03/13/24 29.45 kg/m?   03/13/24 29.45 kg/m?      Smoking Tobacco Use History[1]   Lipid Profile Cholesterol   Date Value Ref Range Status   12/07/2023 152 <200 mg/dL Final     HDL   Date Value Ref Range Status   12/07/2023 64 >40 mg/dL Final     LDL   Date Value Ref Range Status  12/07/2023 91 <100 mg/dL Final     Triglycerides   Date Value Ref Range Status   12/07/2023 97 <150 mg/dL Final      Blood Sugar Hemoglobin A1C   Date Value Ref Range Status   12/08/2023 5.5 4.0 - 5.7 % Final     Comment:     The ADA recommends that most patients with type 1 and type 2 diabetes maintain an A1c level <7%.     Glucose   Date Value Ref Range Status   11/24/2023 123 (H) 70 - 100 mg/dL Final   98/76/7974 899 70 - 100 mg/dL Final   98/89/7974 96 70 - 100 mg/dL Final     Glucose Fasting   Date Value Ref Range Status   12/07/2023 89 70 - 100 mg/dL Final   95/95/7974 898 (H) 70 - 100 mg/dL Final   97/93/7974 91 70 - 100 mg/dL Final          Problems Addressed Today  Encounter Diagnoses   Name Primary?    Cardiovascular symptoms Yes       Assessment and Plan       With her history of Adriamycin  treatment for her breast cancer she remains at increased lifetime risk for cardiomyopathy and heart failure.  This makes meticulous hypertension control imperative.  Review of her blood pressure flowsheet and her awareness of her other readings indicates that she is not adequately controlled with her current medical programming of metoprolol  50 and amlodipine  5.  She has ankle edema intermittently with the amlodipine  and would like to eradicate this if possible.  I am having her discontinue the amlodipine  and increase the metoprolol  to 75 mg single daily dose.  Her heart rate is likely sufficient to tolerate increased to 100 mg daily.  I have asked her to keep track of her blood pressure and let us  know if she is not running below 130/80 most of the time.  That turns out to be the case we will have her increase the Toprol -XL to 100 mg daily dose presuming she is not bradycardic with heart rates into the low mid to low 50s.    I have encouraged her to maintain a regular exercise program.    I have a discussed the importance of her keeping in touch with Shirley Verbenec about changes in availability of effective safe drugs that improved outcomes with elevated like a.  I was can come I am uncomfortable with considering bempedoic acid because it carries low incidence risk of a severe complication namely spontaneous Achilles tendon rupture.  Surely will let her know when there is a safe new drug out that will reduce lipoprotein a without current side effects that are severe and irreversible once they occur such as Achilles tendon rupture.    She had mentioned that she was not losing weight with her walking program I affirmed with her fact that exercise is not a weight loss program but that calorie restriction is.  I think if she focuses on calorie restriction to lose weight should be more successful and will not be dissatisfied with the outcomes from her walking.    I will see her again in a year sooner if needed.    30 minutes were spent  today in the care of this patient and completion of this encounter.  The time was spent reviewing records,  interviewing patient, doing exam, developing diagnosis, creating treatment plan written in patient oriented  terminology  for the AVS, explaining it  to the patient and entering further information in the EMR.      NB: The free text in this document was generated through Dragon(TM) software with editing and proofreading  done by the author of this document Dr. Carlin KATHEE Candy MD, [facility] principally at the point of care. Some errors may persist. If there are questions about content in this document please contact Dr. Candy.  The written information I provided the patient at the conclusion of today's encounter is as follows:  Patient Instructions   07/17/2022 :   Lipoprotein (a) 132(07/17/22)     You are on the right track with the Leqvio .  The lipoprotein a does not have a drug with superpowers like Leqvio  for lipoprotein a reduction.  Orlean is tracking that far better than I am so she is your outstandingly important clinician for the cholesterol control.    Blood pressure does not look quite ideally controlled.  The importance of blood pressure control is magnified if you have had Adriamycin  because Adriamycin  increases the risk of developing late heart failure so a little bit of blood pressure could do more harm for you with regard to heart function then someone without Adriamycin .  I had like you to bump the metoprolol  up to 75 mg daily from current dose 50 and stop the amlodipine .  I think the 25 mg of metoprolol  may be roughly equivalent or even a little bit better than the amlodipine  but we just have to wait and see.  We might go up to 100 mg metoprolol  if your blood pressure is not running consistently under 130/80.    Exercise is good for you 2-1/2 hours a week is medicine and reduces your heart disease risk send some cancer risks like colon cancer.  This is not weight loss therapy this is heart and health therapy.    Calorie restriction is the mainstay of weight loss.  Ozempic does not do magic it just makes food seem unattractive.    I do not think I need to see very often.  When we have adjusted your blood pressure medication I commonly see people sooner than a year.  As you have seen Kate Canes in the past but it would be good for you to check with her in about 4 months to see how things are going with the blood pressure and that she can reschedule me had an appropriate interval sometime in 2026.    Her markers  My Chart is the best way to communicate with us  but if you prefer, call in if you have problems or questions.   Carlin Candy, MD            Current Medications (including today's revisions)   acetaminophen  (TYLENOL ) 325 mg tablet Take two tablets by mouth every 6 hours. Take scheduled for 3 days after surgery, then as needed. Do not exceed 4,000mg  in a 24 hour period. (Patient taking differently: Take two tablets by mouth as Needed. Take scheduled for 3 days after surgery, then as needed. Do not exceed 4,000mg  in a 24 hour period.)    amLODIPine  (NORVASC ) 5 mg tablet Take one tablet by mouth daily.    aspirin  81 mg chewable tablet Chew one tablet by mouth daily.    biotin 1 mg cap Take one capsule by mouth daily.    calcium  carbonate (OS-CAL) 1250 mg (500 mg elemental calcium ) tablet Take one tablet by mouth daily.    CHOLEcalciferoL (vitamin D3) 5000 unit tablet Take one tablet  by mouth daily.    dexAMETHasone  (DECADRON ) 4 mg tablet Take one tablet by mouth twice daily. Take with food.    diclofenac  sodium (VOLTAREN ) 1 % topical gel Apply four g topically to affected area daily as needed.    ezetimibe  (ZETIA ) 10 mg tablet Take one tablet by mouth daily.    Fish,Bora,Flax Oils-OM3,6,9 #1 (TRIPLE OMEGA) 400-400-400 mg capsule Take one capsule by mouth at bedtime daily.    gabapentin  (NEURONTIN ) 100 mg capsule Take two capsules by mouth at bedtime daily.    HYDROcodone /acetaminophen  (NORCO) 5/325 mg tablet Take one tablet by mouth every 6 hours as needed for Pain.    inclisiran (LEQVIO ) 284 mg/1.5 mL injection syringe Inject 1.5 mL under the skin every 180 days. Initially then at 3 months and then every 6 months    iron  dextran complex (INFED  IJ) Inject  to area(s) as directed once.    methocarbamoL  (ROBAXIN ) 500 mg tablet Take one tablet by mouth three times daily.    metoprolol  succinate XL (TOPROL  XL) 50 mg extended release tablet Take one tablet by mouth at bedtime daily.    Miscellaneous Medical Supply misc DME requirements, rx to include the following typed information:    NPI: [id number]  Doctor's typed name: [name], MD  ICD 10: G47.33  Length of need: 99 months  Start date:02/14/2023  Signature date: 02/14/2023  Detailed equipment: CPAP machine with any accessories.    naltrexone  4.5 MG oral capsule (COMPOUND) Take one capsule by mouth daily. (Patient not taking: Reported on 03/27/2024)    other medication Take one Dose by mouth daily. K Complete K1 & K2 as MK-4 & MK-7 : Take 1 softgel by mouth once daily    Selenium 100 mcg tab Take one tablet by mouth daily.    SUMAtriptan  succinate (IMITREX ) 25 mg tablet Take one tablet by mouth at onset of headache. May repeat after 2 hours if needed. Max of 200 mg in 24 hours. (Patient taking differently: as Needed for Migraine symptoms. Take one tablet by mouth at onset of headache. May repeat after 2 hours if needed. Max of 200 mg in 24 hours.)    SYNTHROID  100 mcg tablet Take one tablet by mouth daily 30 minutes before breakfast.    tiZANidine (ZANAFLEX) 2 mg tablet Take one tablet by mouth twice daily as needed.    tretinoin  (RETIN-A ) 0.025 % topical cream Apply  topically to affected area at bedtime daily.    vit A/vit C/vit E/zinc/copper (PRESERVISION AREDS PO) Take 1 tablet by mouth daily.    vitamins, B complex tab Take one tablet by mouth daily.    vitamins, multiple cap Take one capsule by mouth daily.                 [1]   Social History  Tobacco Use   Smoking Status Never    Passive exposure: Past   Smokeless Tobacco Never   Tobacco Comments    Significant second hand smoke from parents [age] years old

## 2024-03-29 ENCOUNTER — Encounter: Admit: 2024-03-29 | Discharge: 2024-03-29 | Payer: MEDICARE

## 2024-04-04 ENCOUNTER — Encounter: Admit: 2024-04-04 | Discharge: 2024-04-04 | Payer: MEDICARE

## 2024-04-04 DIAGNOSIS — Z9189 Other specified personal risk factors, not elsewhere classified: Secondary | ICD-10-CM

## 2024-04-04 DIAGNOSIS — R931 Abnormal findings on diagnostic imaging of heart and coronary circulation: Principal | ICD-10-CM

## 2024-04-05 ENCOUNTER — Encounter: Admit: 2024-04-05 | Discharge: 2024-04-05 | Payer: MEDICARE

## 2024-04-05 ENCOUNTER — Ambulatory Visit: Admit: 2024-04-05 | Discharge: 2024-04-05 | Payer: MEDICARE

## 2024-04-05 VITALS — BP 134/89 | HR 66

## 2024-04-05 DIAGNOSIS — M1651 Unilateral post-traumatic osteoarthritis, right hip: Principal | ICD-10-CM

## 2024-04-05 DIAGNOSIS — M25851 Other specified joint disorders, right hip: Secondary | ICD-10-CM

## 2024-04-05 NOTE — Progress Notes [1]
 [facility] CLINIC NOTE       SUBJECTIVE: 63 year old female presenting in follow-up from last visit on 12/01/2023.  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  She saw Dr. Fernando, Dr. Fernando scheduled her for surgery but then noted that she has osteoporosis.  He wanted to get her bone health optimized, she ended up going to [facility] and found a neurosurgeon who would do the fusion.  She is had a L3-4 fusion.  The surgery was successful, she has great pain relief going down her legs, she no longer has any urinary symptoms.  There does not seem to be any complications with the surgery.  She is however having right groin pain.  We have been evaluating her hip in the past, she has been diagnosed with right hip osteoarthritis.  We got an x-ray in clinic today prior to her visit.  The x-ray did show femoral acetabular impingement, pincer deformity on her right hip.  She reports the pain is only really present when she takes a wrong step or has to abduct her right hip.  She does feel like it is a pinching sensation.  It is intermittent in nature but is very severe and is preventing her from walking which is important for her right now for her recovery from her lumbar fusion.         Review of Systems  Current Medications[1]  Allergies[2]  Physical Exam  There were no vitals filed for this visit.     Oswestry Total Score:: (Patient-Rptd) 46     There is no height or weight on file to calculate BMI.  Physical Exam    Gen: comfortable, NAD    HEENT: NCAT, anicteric sclera    Card: Extremities warm, well-perfused, cap refill <2sec    Pulm: no distress, not cyanotic    Abd: soft, non-distended    Skin: Skin is warm and dry.  Psychiatric: normal mood and affect. Behavior is normal.     Neuro    CNII-XII grossly normal    Mental status appropriate     MSK:    Inspection: grossly symmetric, no obvious deformity, no erythema    Maneuvers: FADIR positive IMPRESSION:  1. Post-traumatic osteoarthritis of right hip    2. Femoroacetabular impingement of right hip          PLAN: Plan is to schedule a right intra-articular hip injection under fluoroscopic guidance.  She was instructed to reach out to her spine surgeon to get approval for an intra-articular hip injection postop.  If they have a time restriction we will just get her scheduled for when they believe it is safe.              [1]   Current Outpatient Medications:     acetaminophen  (TYLENOL ) 325 mg tablet, Take two tablets by mouth every 6 hours. Take scheduled for 3 days after surgery, then as needed. Do not exceed 4,000mg  in a 24 hour period. (Patient taking differently: Take two tablets by mouth as Needed. Take scheduled for 3 days after surgery, then as needed. Do not exceed 4,000mg  in a 24 hour period.), Disp: 40 tablet, Rfl: 0    aspirin  81 mg chewable tablet, Chew one tablet by mouth daily., Disp: 90 tablet, Rfl: 0    biotin 1 mg cap, Take one capsule by mouth daily., Disp: , Rfl:     calcium  carbonate (OS-CAL) 1250 mg (500 mg  elemental calcium ) tablet, Take one tablet by mouth daily., Disp: , Rfl:     CHOLEcalciferoL (vitamin D3) 5000 unit tablet, Take one tablet by mouth daily., Disp: , Rfl:     dexAMETHasone  (DECADRON ) 4 mg tablet, Take one tablet by mouth twice daily. Take with food., Disp: , Rfl:     diclofenac  sodium (VOLTAREN ) 1 % topical gel, Apply four g topically to affected area daily as needed., Disp: , Rfl:     ezetimibe  (ZETIA ) 10 mg tablet, Take one tablet by mouth daily., Disp: 90 tablet, Rfl: 3    Fish,Bora,Flax Oils-OM3,6,9 #1 (TRIPLE OMEGA) 400-400-400 mg capsule, Take one capsule by mouth at bedtime daily., Disp: , Rfl:     gabapentin  (NEURONTIN ) 100 mg capsule, Take two capsules by mouth at bedtime daily., Disp: , Rfl:     HYDROcodone /acetaminophen  (NORCO) 5/325 mg tablet, Take one tablet by mouth every 6 hours as needed for Pain., Disp: , Rfl:     inclisiran (LEQVIO ) 284 mg/1.5 mL injection syringe, Inject 1.5 mL under the skin every 180 days. Initially then at 3 months and then every 6 months, Disp: 1 mL, Rfl: 1    iron  dextran complex (INFED  IJ), Inject  to area(s) as directed once., Disp: , Rfl:     methocarbamoL  (ROBAXIN ) 500 mg tablet, Take one tablet by mouth three times daily., Disp: , Rfl:     metoprolol  succinate XL (TOPROL  XL) 50 mg extended release tablet, Take 1.5 tablets by mouth at bedtime daily., Disp: 135 tablet, Rfl: 3    Miscellaneous Medical Supply misc, DME requirements, rx to include the following typed information:  NPI: [id number] Doctor's typed name: [name], MD ICD 10: G47.33 Length of need: 99 months Start date:02/14/2023 Signature date: 02/14/2023 Detailed equipment: CPAP machine with any accessories., Disp: 1 each, Rfl: 0    naltrexone  4.5 MG oral capsule (COMPOUND), Take one capsule by mouth daily. (Patient not taking: Reported on 03/27/2024), Disp: 90 capsule, Rfl: 1    other medication, Take one Dose by mouth daily. K Complete K1 & K2 as MK-4 & MK-7 : Take 1 softgel by mouth once daily, Disp: , Rfl:     Selenium 100 mcg tab, Take one tablet by mouth daily., Disp: , Rfl:     SUMAtriptan  succinate (IMITREX ) 25 mg tablet, Take one tablet by mouth at onset of headache. May repeat after 2 hours if needed. Max of 200 mg in 24 hours. (Patient taking differently: as Needed for Migraine symptoms. Take one tablet by mouth at onset of headache. May repeat after 2 hours if needed. Max of 200 mg in 24 hours.), Disp: 9 tablet, Rfl: 0    SYNTHROID  100 mcg tablet, Take one tablet by mouth daily 30 minutes before breakfast., Disp: 90 tablet, Rfl: 1    tiZANidine (ZANAFLEX) 2 mg tablet, Take one tablet by mouth twice daily as needed., Disp: , Rfl:     tretinoin  (RETIN-A ) 0.025 % topical cream, Apply  topically to affected area at bedtime daily., Disp: 45 g, Rfl: 11    vit A/vit C/vit E/zinc/copper (PRESERVISION AREDS PO), Take 1 tablet by mouth daily., Disp: , Rfl:     vitamins, B complex tab, Take one tablet by mouth daily., Disp: , Rfl:     vitamins, multiple cap, Take one capsule by mouth daily., Disp: , Rfl:   [2]   Allergies  Allergen Reactions    Mango ANAPHYLAXIS    Other [Unclassified Drug] ANAPHYLAXIS  DUCK Meat and Eggs     Imdur  [Isosorbide  Mononitrate] CHEST TIGHTNESS and RASH    Iodinated Contrast Media RASH and ITCHING     04/2023 pt developed throat itching after IV Contrast in CT, itching subsided after 5 minutes without intervention.   She developed rash on back and neck after hospitalization 06/2022 where she underwent heart cath. Unsure if contrast was   cause of rash, but it developed shortly after procedure.    Macrobid  [Nitrofurantoin  Monohyd/M-Cryst] SEE COMMENTS     Patient has pulmonary fibrosis so this antibiotic is not preferred unless this is the only medication available .     Oxycodone  NAUSEA ONLY     Prefers tramadol     Oxycodone -Acetaminophen  NAUSEA ONLY    Sudafed [Pseudoephedrine Hcl] PALPITATIONS

## 2024-04-06 ENCOUNTER — Encounter: Admit: 2024-04-06 | Discharge: 2024-04-06 | Payer: MEDICARE

## 2024-04-06 ENCOUNTER — Ambulatory Visit: Admit: 2024-04-06 | Discharge: 2024-04-07 | Payer: MEDICARE

## 2024-04-06 VITALS — Ht 63.0 in | Wt 161.0 lb

## 2024-04-06 DIAGNOSIS — S66911A Strain of unspecified muscle, fascia and tendon at wrist and hand level, right hand, initial encounter: Principal | ICD-10-CM

## 2024-04-06 NOTE — Patient Instructions [37]
 It was a pleasure seeing you today. Please contact us if you have any further questions, we are happy to assist.     For nursing/medical/casting or splinting questions, please send a message through MyChart. This is our preferred method of contact and the most efficient way to contact your healthcare team. If you do not have internet access/smartphone you can call my Clinical Nurse Coordinator at [phone]. Please do not leave multiple voicemail's for [place]. Leaving multiple voicemail's delays [place] getting back to you quicker.    NOTE: MyChart messages and phone calls received on weekends, on holidays, and after 4 pm on weekdays will NOT be seen until the following business day.     - For medication refills, please ask your pharmacy to send an electronic request.  Please allow at least 3 business days for medication refills.    - To cancel, change, or schedule a clinic appointment: Call  Scheduling at [phone].  - For any radiology concerns or scheduling, please call [phone].     Any insurance, disability or Family Medical Leave Act [facility]) forms can be faxed to [phone].  Be sure to include your name and date of birth on the form. We will complete and fax the forms where they need to go as soon as we are able. For any questions about disability or FMLA paperwork, please contact Dr. Darden Dates administrative assistant, Karna Christmas at [phone].    Do not hesitate to contact our office with any further questions.Thank you and have a great day!    Philomena Course, MD   [facility]   Orthopedic & Sports Medicine  Office: [phone]  Fax: [phone]

## 2024-04-06 NOTE — Progress Notes [1]
 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  Cindy Randolph was in the hospital recently for a back surgery and was pushing up and felt a painful pop around her right wrist and forearm.  Cindy Randolph had some bruising forming afterwards.  Cindy Randolph says things have improved overall with minimal discomfort around her forearm and her wrist.  Cindy Randolph denies any sensory disturbances.  Cindy Randolph is a 63 y.o. female.     Review of Systems      Objective:          acetaminophen  (TYLENOL ) 325 mg tablet Take two tablets by mouth every 6 hours. Take scheduled for 3 days after surgery, then as needed. Do not exceed 4,000mg  in a 24 hour period. (Patient taking differently: Take two tablets by mouth as Needed. Take scheduled for 3 days after surgery, then as needed. Do not exceed 4,000mg  in a 24 hour period.)    aspirin  81 mg chewable tablet Chew one tablet by mouth daily.    biotin 1 mg cap Take one capsule by mouth daily.    calcium  carbonate (OS-CAL) 1250 mg (500 mg elemental calcium ) tablet Take one tablet by mouth daily.    CHOLEcalciferoL (vitamin D3) 5000 unit tablet Take one tablet by mouth daily.    dexAMETHasone  (DECADRON ) 4 mg tablet Take one tablet by mouth twice daily. Take with food.    diclofenac  sodium (VOLTAREN ) 1 % topical gel Apply four g topically to affected area daily as needed.    ezetimibe  (ZETIA ) 10 mg tablet Take one tablet by mouth daily.    Fish,Bora,Flax Oils-OM3,6,9 #1 (TRIPLE OMEGA) 400-400-400 mg capsule Take one capsule by mouth at bedtime daily.    gabapentin  (NEURONTIN ) 100 mg capsule Take two capsules by mouth at bedtime daily.    HYDROcodone /acetaminophen  (NORCO) 5/325 mg tablet Take one tablet by mouth every 6 hours as needed for Pain.    inclisiran (LEQVIO ) 284 mg/1.5 mL injection syringe Inject 1.5 mL under the skin every 180 days. Initially then at 3 months and then every 6 months    iron  dextran complex (INFED  IJ) Inject  to area(s) as directed once.    methocarbamoL  (ROBAXIN ) 500 mg tablet Take one tablet by mouth three times daily.    metoprolol  succinate XL (TOPROL  XL) 50 mg extended release tablet Take 1.5 tablets by mouth at bedtime daily.    Miscellaneous Medical Supply misc DME requirements, rx to include the following typed information:    NPI: [id number]  Doctor's typed name: [name], MD  ICD 10: G47.33  Length of need: 99 months  Start date:02/14/2023  Signature date: 02/14/2023  Detailed equipment: CPAP machine with any accessories.    naltrexone  4.5 MG oral capsule (COMPOUND) Take one capsule by mouth daily. (Patient not taking: Reported on 03/27/2024)    other medication Take one Dose by mouth daily. K Complete K1 & K2 as MK-4 & MK-7 : Take 1 softgel by mouth once daily    Selenium 100 mcg tab Take one tablet by mouth daily.    SUMAtriptan  succinate (IMITREX ) 25 mg tablet Take one tablet by mouth at onset of headache. May repeat after 2 hours if needed. Max of 200  mg in 24 hours. (Patient taking differently: as Needed for Migraine symptoms. Take one tablet by mouth at onset of headache. May repeat after 2 hours if needed. Max of 200 mg in 24 hours.)    SYNTHROID  100 mcg tablet Take one tablet by mouth daily 30 minutes before breakfast.    tretinoin  (RETIN-A ) 0.025 % topical cream Apply  topically to affected area at bedtime daily.    vit A/vit C/vit E/zinc/copper (PRESERVISION AREDS PO) Take 1 tablet by mouth daily.    vitamins, B complex tab Take one tablet by mouth daily.    vitamins, multiple cap Take one capsule by mouth daily.     Vitals:    04/06/24 1336   PainSc: Four   Weight: 73 kg (161 lb)   Height: 160 cm (5' 3)     Body mass index is 28.52 kg/m?Cindy Randolph     Physical Exam  Constitutional:       General: Cindy Randolph is not in acute distress.     Appearance: Normal appearance. Neurological:      Mental Status: Cindy Randolph is oriented to person, place, and time.   Psychiatric:         Behavior: Behavior normal.       Ortho Exam  Right hand and wrist: The skin is intact, there are no wounds or scars.  There is some mild swelling along the volar radial aspect of the distal forearm with no tenderness.  Cindy Randolph has full range of motion of the wrist and digits.  Cindy Randolph has intact sensation and brisk cap refill to all digits.       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  Cindy Randolph is already getting better.  Cindy Randolph will continue to improve on her own without any formal treatment.  Various options could include therapy, wrist bracing and oral anti-inflammatories.  Cindy Randolph is already making great improvements without any of that at this point.  Cindy Randolph will continue to engage in normal activities.  Cindy Randolph is welcome to follow-up as needed.                       Donnice Boehringer, MD  Associate Professor  Orthopedic Hand Surgery

## 2024-04-09 ENCOUNTER — Encounter: Admit: 2024-04-09 | Discharge: 2024-04-09 | Payer: MEDICARE

## 2024-04-09 ENCOUNTER — Ambulatory Visit: Admit: 2024-04-09 | Discharge: 2024-04-09 | Payer: MEDICARE

## 2024-04-10 NOTE — Patient Instructions [37]
 Thank you for coming to see [place] today.   Please call our office or send a message through MyChart if you have any questions or concerns.                                                                                                                 [facility]    [place]  [address] [place], [place] [address]      [facility]  [address]  [place], [place] [address]      Dr. Joni Reining  [phone] (Nurse Camie Swisher & [place])  [phone] (Fax)      [email or web address]

## 2024-04-11 ENCOUNTER — Encounter: Admit: 2024-04-11 | Discharge: 2024-04-11 | Payer: MEDICARE

## 2024-04-16 ENCOUNTER — Ambulatory Visit: Admit: 2024-04-16 | Discharge: 2024-04-16 | Payer: MEDICARE

## 2024-04-16 ENCOUNTER — Encounter: Admit: 2024-04-16 | Discharge: 2024-04-16 | Payer: MEDICARE

## 2024-04-16 VITALS — BP 121/85 | HR 72 | Temp 97.90000°F | Resp 18 | Wt 160.0 lb

## 2024-04-16 MED ORDER — DENOSUMAB-BBDZ 60 MG/ML SC SYRG
60 mg | Freq: Once | SUBCUTANEOUS | 0 refills
Start: 2024-04-16 — End: ?

## 2024-04-16 MED ORDER — DENOSUMAB-BBDZ 60 MG/ML SC SYRG
60 mg | Freq: Once | SUBCUTANEOUS | 0 refills | Status: CP
Start: 2024-04-16 — End: ?
  Administered 2024-04-16: 22:00:00 60 mg via SUBCUTANEOUS

## 2024-04-16 NOTE — Progress Notes [1]
 Pt in clinic for jubbonti  injection, pt tolerated procedure without incident.  VSS and steady gait witnessed.  Pt provided printed AVS.  Vitals:    04/16/24 1610   BP: 121/85   BP Source: Arm, Right Upper   Pulse: 72   Temp: 36.6 ?C (97.9 ?F)   Resp: 18   SpO2: 97%   O2 Device: None (Room air)   TempSrc: Temporal   Weight: 72.6 kg (160 lb)

## 2024-04-17 DIAGNOSIS — M810 Age-related osteoporosis without current pathological fracture: Principal | ICD-10-CM

## 2024-04-18 ENCOUNTER — Encounter: Admit: 2024-04-18 | Discharge: 2024-04-18 | Payer: MEDICARE

## 2024-04-23 ENCOUNTER — Encounter: Admit: 2024-04-23 | Discharge: 2024-04-23 | Payer: MEDICARE

## 2024-04-23 VITALS — BP 127/83 | HR 70 | Temp 96.40000°F | Resp 16 | Wt 162.0 lb

## 2024-04-23 DIAGNOSIS — C50112 Malignant neoplasm of central portion of left female breast: Principal | ICD-10-CM

## 2024-04-23 DIAGNOSIS — Z08 Encounter for follow-up examination after completed treatment for malignant neoplasm: Secondary | ICD-10-CM

## 2024-04-23 NOTE — Progress Notes [1]
 Name: Cindy Randolph          MRN: [id number]      DOB: 24-Jul-1960      AGE: 63 y.o.   DATE OF SERVICE: 04/23/2024    Subjective:             Reason for Visit:  Follow Up      Latunya Kissick is a 63 y.o. female.      Cancer Staging   Malignant neoplasm of left breast in female, estrogen receptor positive (CMS-HCC)  Staging form: Breast, AJCC 8th Edition  - Clinical stage from 12/22/2020: Stage IA (cT1c, cN0, cM0, G1, ER+, PR+, HER2-) - Signed by Raejean Blane SAUNDERS, MD on 12/22/2020  - Pathologic stage from 02/02/2021: Stage IIIA (pT3, pN3, cM0, G1, ER+, PR+, HER2-) - Signed by Raejean Blane SAUNDERS, MD on 05/27/2021      History of Present Illness  Harue Pribble is a 63 y.o. woman with a diagnosis of left hormone positive breast cancer in July 2022.    She presented for a routine mammogram in July 2022 which showed benign-appearing microcalcifications and an unchanged low-density circumscribed mass in the right upper upper central breast.  There was also a focal asymmetry in the left breast.  She had a diagnostic left mammogram on 12/04/2020 which showed an irregular density at 12:00 in the left breast and on target ultrasound there was an ill-defined area with slightly hypoechoic at 12:00 7 cm from the nipple.  She underwent ultrasound-guided biopsy on 12/10/2020 which showed grade 1 invasive lobular carcinoma ER 91 to 100%, PR 11 to 20%, HER2 1+ (negative by IHC), Ki-67 2 to 5%.  She then had an MRI on 12/26/2020 which showed multiple faint masses in the left breast with non-mass enhancement extending in the upper and inner quadrant.     On 01/22/2021 she underwent bilateral mastectomy with final pathology showing left breast invasive lobular carcinoma, grade 1, multifocal with connections that merged together for a total extent of 7.4 cm, 5 out of 5 lymph nodes were positive for carcinoma.  Markers were ER 96%, PR 10%, HER2 0, Ki-67 3%.  Right breast was benign.    She had staging scans 02/10/2021 which were negative for metastatic disease.  She then received adjuvant chemotherapy with dose dense AC from 02/23/2021 until 04/06/2021.  She was scheduled to start paclitaxel  on 04/20/2021 but declined due to concern for neuropathy.     She underwent left axillary lymph node dissection on 05/14/2021 with final pathology showing carcinoma in 6 of 14 lymph nodes with the largest metastatic focus measuring 1 cm.  This is for a total of 11 out of 19 lymph nodes positive for carcinoma.    She completed adjuvant PMRT on 07/01/2021 and then started letrozole . She did not tolerate letrozole , anastrozole  or aromasin . She preferred to remain off of an AI due to quality of life. She also trialed CDK4/6 inhibitor but discontinued due to side effects.    Ms. Shaikh is here for follow up. She is not on any aromatase inhibitors at this time.      She had back surgery (L3/L4/L5 stenosis). She thinks this has helped some other symptoms. She continues to do steroid injections in the hip. She has been walking more. She also has a new LBBB on EKG. She is undergoing workup/management for this now.                Review of Systems  Constitutional:  Negative for activity change, chills, diaphoresis, fatigue, fever and unexpected weight change.   HENT:  Negative for mouth sores, nosebleeds and sinus pressure.    Eyes:  Negative for pain and visual disturbance.   Respiratory:  Negative for cough, shortness of breath and wheezing.    Cardiovascular:  Negative for chest pain and palpitations.   Gastrointestinal:  Negative for abdominal distention, abdominal pain, constipation, diarrhea and vomiting.   Genitourinary:  Negative for menstrual problem and vaginal bleeding.   Musculoskeletal:  Positive for arthralgias (tolerable ) and back pain (stable). Negative for myalgias and neck pain.   Skin:  Negative for rash and wound.   Neurological:  Negative for dizziness, weakness, light-headedness and numbness. Headaches: mild.  Hematological: Negative for adenopathy. Does not bruise/bleed easily.   Psychiatric/Behavioral:  Negative for sleep disturbance. The patient is not nervous/anxious.          Objective:          acetaminophen  (TYLENOL ) 325 mg tablet Take two tablets by mouth every 6 hours. Take scheduled for 3 days after surgery, then as needed. Do not exceed 4,000mg  in a 24 hour period. (Patient taking differently: Take two tablets by mouth as Needed. Take scheduled for 3 days after surgery, then as needed. Do not exceed 4,000mg  in a 24 hour period.)    aspirin  81 mg chewable tablet Chew one tablet by mouth daily.    biotin 1 mg cap Take one capsule by mouth daily.    calcium  carbonate (OS-CAL) 1250 mg (500 mg elemental calcium ) tablet Take one tablet by mouth daily.    CHOLEcalciferoL (vitamin D3) 5000 unit tablet Take one tablet by mouth daily.    dexAMETHasone  (DECADRON ) 4 mg tablet Take one tablet by mouth twice daily. Take with food.    diclofenac  sodium (VOLTAREN ) 1 % topical gel Apply four g topically to affected area daily as needed.    ezetimibe  (ZETIA ) 10 mg tablet Take one tablet by mouth daily.    Fish,Bora,Flax Oils-OM3,6,9 #1 (TRIPLE OMEGA) 400-400-400 mg capsule Take one capsule by mouth at bedtime daily.    gabapentin  (NEURONTIN ) 100 mg capsule Take two capsules by mouth at bedtime daily.    HYDROcodone /acetaminophen  (NORCO) 5/325 mg tablet Take one tablet by mouth every 6 hours as needed for Pain.    inclisiran (LEQVIO ) 284 mg/1.5 mL injection syringe Inject 1.5 mL under the skin every 180 days. Initially then at 3 months and then every 6 months    iron  dextran complex (INFED  IJ) Inject  to area(s) as directed once.    methocarbamoL  (ROBAXIN ) 500 mg tablet Take one tablet by mouth three times daily.    metoprolol  succinate XL (TOPROL  XL) 50 mg extended release tablet Take 1.5 tablets by mouth at bedtime daily.    Miscellaneous [employer] misc DME requirements, rx to include the following typed information:    NPI: [id number]  Doctor's typed name: [name], MD  ICD 10: G47.33  Length of need: 99 months  Start date:02/14/2023  Signature date: 02/14/2023  Detailed equipment: CPAP machine with any accessories.    naltrexone  4.5 MG oral capsule (COMPOUND) Take one capsule by mouth daily.    other medication Take one Dose by mouth daily. K Complete K1 & K2 as MK-4 & MK-7 : Take 1 softgel by mouth once daily    Selenium 100 mcg tab Take one tablet by mouth daily.    SUMAtriptan  succinate (IMITREX ) 25 mg tablet Take one tablet  by mouth at onset of headache. May repeat after 2 hours if needed. Max of 200 mg in 24 hours. (Patient taking differently: as Needed for Migraine symptoms. Take one tablet by mouth at onset of headache. May repeat after 2 hours if needed. Max of 200 mg in 24 hours.)    SYNTHROID  100 mcg tablet Take one tablet by mouth daily 30 minutes before breakfast.    tretinoin  (RETIN-A ) 0.025 % topical cream Apply  topically to affected area at bedtime daily.    vit A/vit C/vit E/zinc/copper (PRESERVISION AREDS PO) Take 1 tablet by mouth daily.    vitamins, B complex tab Take one tablet by mouth daily.    vitamins, multiple cap Take one capsule by mouth daily.     Vitals:    04/23/24 1343   BP: 127/83   BP Source: Arm, Right Upper   Pulse: 70   Temp: (!) 35.8 ?C (96.4 ?F)   Resp: 16   SpO2: 99%   TempSrc: Temporal   PainSc: Five   Weight: 73.5 kg (162 lb)       Body mass index is 28.7 kg/m?[name]     Pain Score: Five  Pain Loc: Back (arthritis)    Fatigue Scale: 5    Pain Addressed:  Current regimen working to control pain. Recent fall.    Patient Evaluated for a Clinical Trial: No treatment clinical trial available for this patient.     Eastern Cooperative Oncology Group performance status is 0, Fully active, able to carry on all pre-disease performance without restriction.        Physical Exam  Constitutional:       Appearance: She is well-developed.   HENT:      Head: Normocephalic and atraumatic. Eyes:      General: No scleral icterus.     Conjunctiva/sclera: Conjunctivae normal.   Cardiovascular:      Rate and Rhythm: Normal rate.   Pulmonary:      Effort: Pulmonary effort is normal. No respiratory distress.   Chest:   Breasts:     Right: No mass, skin change or tenderness.      Left: No mass, skin change or tenderness.       Abdominal:      Tenderness: There is no abdominal tenderness.   Musculoskeletal:         General: No tenderness.      Cervical back: Normal range of motion and neck supple.   Lymphadenopathy:      Upper Body:      Right upper body: No supraclavicular or axillary adenopathy.      Left upper body: No supraclavicular or axillary adenopathy.   Skin:     General: Skin is warm and dry.      Findings: No rash.   Neurological:      Mental Status: She is alert and oriented to person, place, and time.      Sensory: No sensory deficit.      Motor: No weakness.   Psychiatric:         Behavior: Behavior normal.         Thought Content: Thought content normal.            CBC w diff    Lab Results   Component Value Date/Time    WBC 11.60 (H) 11/24/2023 03:31 PM    RBC 4.34 11/24/2023 03:31 PM    HGB 13.6 11/24/2023 03:31 PM    HCT 40.4 11/24/2023 03:31 PM  MCV 93.2 11/24/2023 03:31 PM    MCH 31.5 11/24/2023 03:31 PM    MCHC 33.8 11/24/2023 03:31 PM    RDW 14.3 11/24/2023 03:31 PM    PLTCT 180 11/24/2023 03:31 PM    MPV 8.0 11/24/2023 03:31 PM    Lab Results   Component Value Date/Time    NEUT 78.2 (H) 11/24/2023 03:31 PM    ANC 9.10 (H) 11/24/2023 03:31 PM    LYMA 11.9 (L) 11/24/2023 03:31 PM    ALC 1.40 11/24/2023 03:31 PM    MONA 7.9 11/24/2023 03:31 PM    AMC 0.90 (H) 11/24/2023 03:31 PM    EOSA 1.7 11/24/2023 03:31 PM    AEC 0.20 11/24/2023 03:31 PM    BASA 0.3 11/24/2023 03:31 PM    ABC 0.00 11/24/2023 03:31 PM        Comprehensive Metabolic Profile    Lab Results   Component Value Date/Time    NA 139 11/24/2023 03:31 PM    K 3.9 11/24/2023 03:31 PM    CL 104 11/24/2023 03:31 PM    CO2 26 11/24/2023 03:31 PM    GAP 9 11/24/2023 03:31 PM    BUN 15 11/24/2023 03:31 PM    CR 0.88 11/24/2023 03:31 PM    GLU 123 (H) 11/24/2023 03:31 PM    Lab Results   Component Value Date/Time    CA 8.8 11/24/2023 03:31 PM    PO4 3.6 12/19/2023 02:09 PM    ALBUMIN 4.5 11/24/2023 03:31 PM    TOTPROT 7.1 12/19/2023 02:09 PM    ALKPHOS 51 11/24/2023 03:31 PM    AST 30 12/07/2023 08:19 AM    ALT 30 12/07/2023 08:19 AM    TOTBILI 0.3 11/24/2023 03:31 PM    GFR >60 11/24/2023 03:31 PM                 Assessment and Plan:  Lota Leamer is a 63 y.o. postmenopausal woman with a history of Stage IIIA (pT3pN3cN0) hormone positive invasive lobular carcinoma.     She completed 4 cycles of adjuvant AC, declined Taxol . She completed radiation 07/01/21.    Long term plan included at least 5 years of endocrine therapy, with plan for extended therapy and adjuvant CDK4/6 inhibitor. She is not on any adjuvant endocrine therapy. She started letrozole  06/2021. She did not tolerate Letrozole  (joint arthralgia). Changed to Anastrozole  01/11/22, did not tolerate due to joint arthralgia. Switched to Aromasin  06/16/22. Sheril stopped her AI 09/04/22. Abemaciclib  started 10/25/21. Did not tolerate 100 mg BID dose. Discontinued 12/07/21. Restarted at 50mg  BID 01/21/22. Drug holiday started 04/06/22 due to side effected. Discontinued 05/03/22, patient requested due to intolerable side effects. She understands the risk of recurrence and role of adjuvant endocrine therapy in reducing risk and declines therapy as she feels quality of life is improved without these medications.    Since she is no longer on aromatase inhibitor can defer management of bone density to PCP and endocrinologist. Sheril experienced side effects with zometa  and wishes to discontinue.    CT chest for follow up nodules: stable nodules, slight increase in right axillary lymph node immediately posterior to two surgical clips. Surgical biopsy performed 03/24/22; which was negative for malignancy.     Full scans for body pain and headaches done 12/31/2022 and 01/07/23 with no evidence of malignancy.    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    Anxiety/depression. Patient tolerating Lexapro  20 mg.  Continue therapy.      RTC in 6 months.                  Tinnie Mount, MD  Breast Medical Oncology  Associate Professor of Internal Medicine, Division of Medical Oncology  [facility]

## 2024-04-24 ENCOUNTER — Encounter: Admit: 2024-04-24 | Discharge: 2024-04-24 | Payer: MEDICARE

## 2024-04-24 ENCOUNTER — Ambulatory Visit: Admit: 2024-04-24 | Discharge: 2024-04-25 | Payer: MEDICARE

## 2024-04-25 ENCOUNTER — Encounter: Admit: 2024-04-25 | Discharge: 2024-04-25 | Payer: MEDICARE

## 2024-04-25 NOTE — Progress Notes [1]
 Attempted to contact patient for routine ccm call left vm message requesting return call back.

## 2024-04-26 ENCOUNTER — Ambulatory Visit: Admit: 2024-04-26 | Discharge: 2024-04-26 | Payer: MEDICARE

## 2024-04-26 ENCOUNTER — Encounter: Admit: 2024-04-26 | Discharge: 2024-04-26 | Payer: MEDICARE

## 2024-04-26 VITALS — BP 121/92 | HR 71 | Temp 97.30000°F | Ht 63.0 in | Wt 150.0 lb

## 2024-04-26 DIAGNOSIS — M1651 Unilateral post-traumatic osteoarthritis, right hip: Principal | ICD-10-CM

## 2024-04-26 DIAGNOSIS — M25851 Other specified joint disorders, right hip: Secondary | ICD-10-CM

## 2024-04-26 MED ORDER — BUPIVACAINE (PF) 0.5 % (5 MG/ML) IJ SOLN
5 mL | Freq: Once | INTRAMUSCULAR | 0 refills | Status: CP
Start: 2024-04-26 — End: ?

## 2024-04-26 MED ORDER — TRIAMCINOLONE ACETONIDE 40 MG/ML IJ SUSP
40 mg | Freq: Once | EPIDURAL | 0 refills | Status: CP
Start: 2024-04-26 — End: ?

## 2024-04-26 MED ORDER — LIDOCAINE (PF) 10 MG/ML (1 %) IJ SOLN
2 mL | Freq: Once | INTRAMUSCULAR | 0 refills | Status: CP
Start: 2024-04-26 — End: ?

## 2024-04-26 MED ORDER — GADOBENATE DIMEGLUMINE 529 MG/ML (0.1MMOL/0.2ML) IV SOLN
2.5 mL | Freq: Once | 0 refills | Status: CP
Start: 2024-04-26 — End: ?

## 2024-04-26 NOTE — Discharge Instr - Other Info [68]
 GENERAL POST PROCEDURE INSTRUCTIONS  Physician: _________________________________  Procedure Completed Today:  Joint Injection (hip, knee, shoulder)  Cervical Epidural Steroid Injection  Cervical Transforaminal Steroid Injection  Trigger Point Injection  Caudal Epidural Steroid Injection  Piriformis Injection  Pudendal Nerve Block  Other _____________________ Thoracic Epidural Steroid Injection  Lumbar Epidural Steroid Injection  Lumbar Transforaminal Steroid Injection  Facet Joint Injection  Celiac Nerve Block  Sacrococcygeal  Sacroiliac Joint Injection   Important information following your procedure today:  You may drive today     If you had sedation, you may NOT drive today  Rest at home for the next 6 hours.  You may then begin to resume your normal activities.  DO NOT drive any vehicle, operate any power tools, drink alcohol, make any major decisions, or sign any legal documents for the next 12 hours.  Pain relief may not be immediate. It is possible you may even experience an increase in pain during the first 24-48 hours followed by a gradual decrease of your pain.  Though the procedure is generally safe, and complications are rare, we do ask that you be aware of any of the following:  Any swelling, persistent redness, new bleeding or drainage from the site of the injection.  You should not experience a severe headache.  You should not run a fever over 101oF.  New onset of sharp, severe back and or neck pain.  New onset of upper or lower extremity numbness or weakness.  New difficulty controlling bowel or bladder function after injection.  New shortness of breath.  ** If any of these occur, please call to report this occurrence to the nurse of Dr. Noralyn Pick at [phone]. If you are calling after 4:00 p.m. or on weekends or holidays, please call [phone] and ask to have the resident physician on call for the physician paged or go to your local emergency room.  You may experience soreness at the injection site. Ice can be applied at 20-minute intervals for the first 24 hours. The following day you may alternate ice with heat if you are experiencing muscle tightness, otherwise continue with ice. Ice works best at decreasing pain. Avoid application of direct heat, hot showers or hot tubs today.  Avoid strenuous activity today. You many resume your regular activities and exercise tomorrow.  Patients with diabetes may see an elevation in blood sugars for 7-10 days after the injection. It is important to pay close attention to your diet, check your blood sugars daily and report extreme elevations to the physician that manages your diabetes.  Patients taking daily blood thinners can resume their regular dose this evening.  It is important that you take all medications ordered by your pain physician. Taking medications as ordered is an important part of your pain care plan. If you cannot continue the medication plan, please notify the physician.    Possible side effects to steroids that may occur:  Flushing or redness of the face  Irritability  Fluid retention  Change in women's menses  Minor headache    If you are unable to keep your upcoming appointment, please notify the [facility] scheduler at [phone] at least 24 hours in advance. If you have questions for the surgery center, call [facility] at [phone].

## 2024-04-26 NOTE — Procedures [3]
 Attending Surgeon: Ozell JAYSON Don, MD    Anesthesia: Local      Large Joint Drain/Inject (Procedure): R hip joint    Consent:   Consent obtained: verbal and written  Consent given by: patient  Risks discussed: bleeding, damage to surrounding structures, hyperglycemia, infection, pain and skin discoloration  Alternatives discussed: alternative treatment and no treatment  Discussed with patient the purpose of the treatment/procedure, other ways of treating my condition, including no treatment/ procedure and the risks and benefits of the alternatives. Patient has decided to proceed with treatment/procedure.        Universal Protocol:  Relevant documents: relevant documents present and verified  Test results: test results available and properly labeled  Imaging studies: imaging studies available  Required items: required blood products, implants, devices, and special equipment available  Site marked: the operative site was marked  Patient identity confirmed: Patient identify confirmed verbally with patient.        Time out: Immediately prior to procedure a time out was called to verify the correct patient, procedure, equipment, support staff and site/side marked as required      Procedures Details:  Procedure Peformed: Injection Only  Indications: pain  Details:Prep: 2% chlorhexidine   22 G needle, fluoroscopy-guided       lateral approachOutcome: tolerated well, no immediate complications  Comments: INTERVENTIONAL PAIN MANAGEMENT PROCEDURE REPORT    Intra-articular Hip Joint Injection    Date of Service: 04/26/2024    Procedure Title(s):    1.Right intra-articular hip joint injection   2. Intraoperative fluoroscopy     Attending Surgeon: Ozell JAYSON Don, MD    Anesthesia: Local                       Anxiolysis No           Procedural sedation No    Indications: Cindy Randolph is a 63 y.o. female with a diagnosis of hip OA. The patient's history and physical exam were reviewed. The risks, benefits and alternatives to the procedure were discussed, and all questions were answered to the patient's satisfaction. The patient agreed to proceed, and written informed consent was obtained.     Procedure in Detail: IV was started? No    The patient was brought into the procedure room and placed in the prone position on the fluoroscopy table. Standard monitors were placed, and vital signs were observed throughout the procedure. The area of the Right hip was prepped with Alcohol and draped in a sterile manner.     The right femoral neck was identified and marked under AP fluoroscopy. The skin and subcutaneous tissues were anesthetized with 1% lidocaine . A 22-gauge, 5-inch spinal needle was advanced until bone was contacted. Negative aspiration was confirmed and 1mL of contrast was injected and the intra-articular space was appropriately outlined.     Then, a solution consisting of 2 mL triamcinolone  (80mg /mL) and 2 mL 0.25% bupivacaine  was injected. The needle was removed with a lidocaine  flush.    The same procedure was then performed on the opposite side: No.    The patient's back was cleaned and a bandage was placed over the site of needle insertion.    Disposition: The patient tolerated the procedure well, and there were no apparent complications. Vital signs remained stable througtout the procedure. The patient was taken to the recovery area where discharge instructions for the procedure were given.     Estimated Blood Loss: minimal    Specimens: none  Complications: None        Estimated blood loss: none or minimal  Specimens: none  Patient tolerated the procedure well with no immediate complications. Pressure was applied, and hemostasis was accomplished.  Administrations This Visit       bupivacaine  PF (MARCAINE ) 0.5 % injection 5 mL       Admin Date  04/26/2024 Action  Given Dose  5 mL Route  Injection Documented By  Sharl Mom, RN              gadobenate dimeglumine  (MULTIHANCE ) injection 2.5 mL       Admin Date  04/26/2024 Action  Given Dose  2.5 mL Route  SEE ADMIN INSTRUCTIONS Documented By  Sharl Mom, RN              lidocaine  PF 1% (10 mg/mL) injection 2 mL       Admin Date  04/26/2024 Action  Given Dose  2 mL Route  Injection Documented By  Sharl Mom, RN              triamcinolone  acetonide (KENALOG -40) injection 40 mg       Admin Date  04/26/2024 Action  Given Dose  40 mg Route  Epidural Documented By  Sharl Mom, RN

## 2024-04-26 NOTE — Progress Notes [1]
 [facility]  INTERVENTIONAL PAIN PROCEDURE HISTORY AND PHYSICAL    No chief complaint on file.      HISTORY OF PRESENT ILLNESS:  Cindy Randolph is a 63 y.o. year old female who presents for injection.  Denies fevers, chills, or recent hospitalizations.  Patient denies currently taking blood thinning medications.       Past Medical History:    Accidental fall    Acquired hypothyroidism    Actinic keratosis    Adverse drug reaction    Allergy    Anxiety    Aortic aneurysm    Ashkenazi Jewish ancestry requiring population-specific genetic screening    Back pain    Breast cancer in female (CMS-HCC)    Cancer of skin    Cancer of thyroid (CMS-HCC)    Chest pain    Constipation    Coronary artery disease    Coronary atherosclerosis    Degenerative disc disease, cervical    Degenerative disc disease, lumbar    Degenerative disc disease, thoracic    Depressive disorder, not elsewhere classified    Dizziness    Esophageal stricture    Essential hypertension    Family history of coronary artery disease in brother    Fibromyalgia    Genetic testing    GERD (gastroesophageal reflux disease)    H/O total thyroidectomy    Hashimoto's thyroiditis    Heart murmur    Hiatal hernia    High cholesterol    History of bilateral mastectomy    History of blood in urine    History of external beam radiation therapy    History of thyroid cancer    Hormone replacement therapy    Hx antineoplastic chemo    Hyperparathyroidism    Hyperthyroidism    Hyponatremia    Hypothyroid    Incontinence    Infection    Inflammatory arthritis    Joint pain    Limb alert care status    Low vitamin D level    Migraine    Mild mitral and aortic regurgitation    Nerve injury    Obstructive sleep apnea    Osteoporosis    Other and unspecified hyperlipidemia    Other malignant neoplasm without specification of site    Peripheral neuropathy    Personal history of irradiation    Post-traumatic stress disorder    Postmenopausal    PUD (peptic ulcer disease) Pulmonary fibrosis (CMS-HCC)    Radiation-induced pulmonary fibrosis    Seasonal allergic reaction    Sleep apnea    Spinal headache    Spinal stenosis    Thyroid nodule    Torn meniscus    Traumatic brain injury (CMS-HCC)    Ulcer    Unspecified deficiency anemia       Surgical History:   Procedure Laterality Date    HX WRIST FRACTURE SURGERY  1993    Baker's thumb with fixation    ARTHROPLASTY  2001    L ACL    COLONOSCOPY  2017    normal    ARTHROSCOPY KNEE WITH PARTIAL LATERAL MENISCECTOMY AND LEFT KNEE INJECTION. Right 01/08/2021    Performed by Niels Dorise MATSU, MD at [facility]    BILATERAL TOTAL MASTECTOMIES Bilateral 01/22/2021    Performed by Raejean Blane SAUNDERS, MD at [facility] OR    INTRAOPERATIVE SENTINEL LYMPH NODE IDENTIFICATION WITH/ WITHOUT NON-RADIOACTIVE DYE INJECTION Left 01/22/2021    Performed by Raejean Blane SAUNDERS, MD at [facility] OR    INJECTION  RADIOACTIVE TRACER FOR SENTINEL NODE IDENTIFICATION Left 01/22/2021    Performed by Raejean Blane SAUNDERS, MD at [facility] OR    LEFT AXILLARY SENTINEL LYMPH NODE BIOPSY Left 01/22/2021    Performed by Raejean Blane SAUNDERS, MD at [facility]    BILATERAL CHEST FLAT CLOSURE Bilateral 01/22/2021    Performed by everitt Elin Rosaline CHRISTELLA, MD at [facility]    BILATERAL CHEST FLAT CLOSURE x 8 Bilateral 01/22/2021    Performed by everitt Elin Rosaline CHRISTELLA, MD at [facility]    EXCISION BENIGN LESION 0.5 CM OR LESS - TORSO Right 01/22/2021    Performed by everitt Elin Rosaline CHRISTELLA, MD at [facility] OR    TUNNELED VENOUS PORT PLACEMENT Right 02/19/2021    For chemo; Removed a few months later    Placement of port-a-cath - 23F Right 02/19/2021    Performed by Freund, Elsie LITTIE Raddle., MD at [facility]    FLUOROSCOPIC GUIDANCE CENTRAL VENOUS ACCESS DEVICE PLACEMENT/ REPLACEMENT/ REMOVAL N/A 02/19/2021    Performed by Claudean Elsie LITTIE Raddle., MD at [facility] OR    Left Completion Axillary Lymph Node Dissection Left 05/14/2021    Performed by Raejean Blane SAUNDERS, MD at [facility]    REMOVAL TUNNELED CENTRAL VENOUS ACCESS DEVICE INCLUDING PORT/ PUMP Right 05/14/2021    Performed by Raejean Blane SAUNDERS, MD at [facility]    COLONOSCOPY DIAGNOSTIC WITH SPECIMEN COLLECTION BY BRUSHING/ WASHING - FLEXIBLE N/A 10/14/2021    Performed by Gladis Morene CHRISTELLA, MD at [facility]    ESOPHAGOGASTRODUODENOSCOPY WITH SPECIMEN COLLECTION BY BRUSHING/ WASHING N/A 06/17/2022    Performed by Chipper Neas, MD at [facility] ENDO    ESOPHAGOGASTRODUODENOSCOPY WITH DILATION ESOPHAGUS WITH BALLOON 30 MM OR GREATER - FLEXIBLE N/A 06/17/2022    Performed by Chipper Neas, MD at [facility] ENDO    ANGIOGRAPHY CORONARY ARTERY WITH LEFT HEART CATHETERIZATION N/A 07/16/2022    Performed by Charlanne Francois, MD at [facility] CATH LAB    PERCUTANEOUS CORONARY STENT PLACEMENT WITH ANGIOPLASTY N/A 07/16/2022    Performed by Charlanne Francois, MD at [facility] CATH LAB    ESOPHAGEAL MOTILITY STUDY N/A 02/08/2023    Performed by Ledora Catalina, MD at [facility] ENDO    ESOPHAGOGASTRODUODENOSCOPY WITH BIOPSY - FLEXIBLE N/A 02/08/2023    Performed by Ledora Catalina, MD at [facility] ENDO    GASTROESOPHAGEAL REFLUX TEST WITH MUCOSAL ATTACHED TELEMETRY PH ELECTE N/A 02/08/2023    Performed by Ledora Catalina, MD at [facility] ENDO    ESOPHAGOGASTRODUODENOSCOPY WITH DILATION GASTRIC/ DUODENAL STRICTURE - FLEXIBLE N/A 02/08/2023    Performed by Ledora Catalina, MD at [facility] ENDO    LAPAROSCOPIC REPAIR PARAESOPHAGEAL HERNIA WITHOUT FUNDOPLASTY N/A 06/15/2023    Performed by Jacqulyn Delon BIRCH, MD at [facility] OR    LAPAROSCOPIC REPAIR PARAESOPHAGEAL HERNIA WITH FUNDOPLASTYUSING TIF DEVICE N/A 06/15/2023    Performed by Ledora Catalina, MD at [facility] OR    ESOPHAGOGASTRODUODENOSCOPY WITH SPECIMEN COLLECTION BY BRUSHING/ WASHING N/A 06/15/2023    Performed by Ledora Catalina, MD at [facility] OR    ESOPHAGOGASTRODUODENOSCOPY, WITH BALLOON DILATION OF LESS THAN 30 MILLIMETERS N/A 11/23/2023    Performed by Riccardo Sane, MD at [facility] OR    ACL RECONSTRUCTION  06/03/1999    BLADDER SURGERY  Nov 2024    [place]amid procedure by non-[facility] doc    BREAST CYST ASPIRATION  Dec 2000    Neg for cancer, left breast cyst    BREAST SURGERY  01/22/2021    Bilat mastectomy  CARDIAC CATHERIZATION  07/16/2022    Dr. Charlanne, [place], no stent    CARDIOVASCULAR STRESS TEST  Feb 2024    At [facility]    ECHOCARDIOGRAM PROCEDURE  2022    See oncology history    ELECTROCARDIOGRAM  06/17/22    Most recent 12 lead done post egd    ESOPHAGUS SURGERY  Summer 2025    Dilation    EVENT MONITOR  February 2024    FRACTURE SURGERY  1993    ORIF L 1st metacarpal    HX BREAST BIOPSY  2022    See [place] records positive for lobular    HX CARPAL TUNNEL RELEASE  1996    With second and other oregbnancies    HX EPISIOTOMY  1994    Healed well    HX FUSION PROCEDURE  03/15/2024    Posterior TLIF, O-arm assosted, L3-4 fusion, discectomy, laminectomy, bilat facetectomy    HX HEART CATHETERIZATION  Feb 2024 dx    No stents. (2) 50% blockages LAD    HX MASTECTOMY  09/03/222    Bilateral; cancer in left breast    HX SKIN BIOPSY  2025    found two BCC on face; both removed by Mohs    HX SURGERY  September 2023    Bilateral mastectomy    HX THYROIDECTOMY  1980    Parathyroids not removed    KNEE SURGERY  L ACL as above    L ACL    LAMINECTOMY  03/15/2024    See above    LAPAROSCOPY  Bladder April 2025    Knee, bladder    LYMPH NODE DISSECTION  12/29/222    Left    PORTACATH PLACEMENT  02/05/2021    Date approximate; for chemo, right. Removed 04/2021    PR LAPAROSCOPY SURG RPR INITIAL INGUINAL HERNIA  Jun 14, 2023    Esophageal    SPINE SURGERY  03/15/2024    MILD at [place], Dr. Velinda Cordia    STOMACH SURGERY  Jan 2025    cTIF    SURGERY  Winter/Spring 2025    MOS procedure x2 face    THYROIDECTOMY  1980    UMBILICAL ARTERIAL CATH - BEDSIDE  February, 2024 at [place]    Mild CAD       family history includes ADD/ADHD in her brother, daughter, son, son, and son; Alcohol abuse in her father; Arthritis in her mother and paternal grandmother; Arthritis-osteo in her paternal grandmother; Asthma in her brother; Back pain in her brother, brother, brother, mother, and paternal grandmother; Basal Cell Carcinoma in her brother, brother, and brother; Bipolar Disorder in her son; Birth Defect in her daughter and nephew; Cancer in her brother, mother, and other family members; Cancer-Breast (age of onset: 28) in her mother; Trent (age of onset: 50) in her maternal great-aunt; Cancer-Skin in her brother; Coronary Artery Disease in her father; Dementia in her maternal grandmother; Diabetes in her brother, brother, brother, father, maternal grandfather, paternal grandfather, and paternal grandmother; Early Death in her father; Growth/Development Disorders in her daughter; Heart Attack in her father; Heart Disease in her father; Heart problem in her brother and father; High Cholesterol in her brother; Hypertension in her brother, brother, brother, father, maternal grandfather, and mother; Joint Pain in her mother; Miscarriage in her daughter and mother; Miscarriages in her mother; Neck Pain in her mother; Scoliosis in her daughter and daughter; Stroke in her brother and maternal uncle; Sudden Cardiac Death in her father; Thyroid Disease in her  maternal uncle, maternal uncle and another family member.    Social History     Socioeconomic History    Marital status: Married    Number of children: 8   Occupational History    Occupation: Retired engineer, civil (consulting)     Comment: Worked in [employer]; also worked in the ambulatory setting   Tobacco Use    Smoking status: Never     Passive exposure: Past    Smokeless tobacco: Never    Tobacco comments:     Significant second hand smoke from parents 24-39 years old   Vaping Use    Vaping status: Never Used   Substance and Sexual Activity    Alcohol use: Not Currently     Comment: Prior to 2020, socially, one drink once a week at most    Drug use: Not Currently     Types: Marijuana     Comment: Not since minimal experimentation at college parties    Sexual activity: Not Currently     Partners: Male     Birth control/protection: Post-menopausal, None       Allergies[1]    Vitals:    04/26/24 1210   BP: (!) 121/92   BP Source: Arm, Right Upper   Pulse: 71   Temp: 36.3 ?C (97.3 ?F)   SpO2: 99%   O2 Device: None (Room air)   Weight: 68 kg (150 lb)   Height: 160 cm (5' 3)        Oswestry Total Score:: 48    REVIEW OF SYSTEMS: 10 point ROS obtained and negative except hip pain      PHYSICAL EXAM:  General: 63 y.o. female appears stated age, in no acute distress  HEENT: Normocephalic, atraumatic  Neck: No thyroidmegaly  Cardiovascular: Well perfused  Pulmonary: Unlabored respirations  Extremities: No cyanosis, clubbing, or edema  Skin: No lesions seen on exposed skin  Psychiatric:  Appropriate mood and affect  Musculoskeletal: No atrophy.   Neurologic: Antigravity strength in all extremities. CN II -XII grossly intact.  Alert and oriented x 3.           IMPRESSION:    1. Post-traumatic osteoarthritis of right hip    2. Femoroacetabular impingement of right hip         PLAN: Intraarticular hip injection right                 [1]   Allergies  Allergen Reactions    Mango ANAPHYLAXIS    Other [Unclassified Drug] ANAPHYLAXIS     DUCK Meat and Eggs     Imdur  [Isosorbide  Mononitrate] CHEST TIGHTNESS and RASH    Iodinated Contrast Media RASH and ITCHING     04/2023 pt developed throat itching after IV Contrast in CT, itching subsided after 5 minutes without intervention.   She developed rash on back and neck after hospitalization 06/2022 where she underwent heart cath. Unsure if contrast was   cause of rash, but it developed shortly after procedure.    Macrobid  [Nitrofurantoin  Monohyd/M-Cryst] SEE COMMENTS     Patient has pulmonary fibrosis so this antibiotic is not preferred unless this is the only medication available .     Oxycodone  NAUSEA ONLY     Prefers tramadol     Oxycodone -Acetaminophen  NAUSEA ONLY    Sudafed [Pseudoephedrine Hcl] PALPITATIONS

## 2024-04-30 ENCOUNTER — Encounter: Admit: 2024-04-30 | Discharge: 2024-04-30 | Payer: MEDICARE

## 2024-04-30 ENCOUNTER — Ambulatory Visit: Admit: 2024-04-30 | Discharge: 2024-05-01 | Payer: MEDICARE

## 2024-04-30 VITALS — Ht 63.0 in | Wt 160.0 lb

## 2024-04-30 DIAGNOSIS — Z08 Encounter for follow-up examination after completed treatment for malignant neoplasm: Secondary | ICD-10-CM

## 2024-04-30 DIAGNOSIS — D229 Melanocytic nevi, unspecified: Secondary | ICD-10-CM

## 2024-04-30 DIAGNOSIS — L570 Actinic keratosis: Principal | ICD-10-CM

## 2024-04-30 DIAGNOSIS — L814 Other melanin hyperpigmentation: Secondary | ICD-10-CM

## 2024-04-30 DIAGNOSIS — L578 Other skin changes due to chronic exposure to nonionizing radiation: Secondary | ICD-10-CM

## 2024-04-30 DIAGNOSIS — L821 Other seborrheic keratosis: Secondary | ICD-10-CM

## 2024-04-30 NOTE — Progress Notes [1]
 Date of Service: 04/30/2024     Dermatology Problem List:   # Dyshidrotic Eczema, previously used clobetasol  0.5% ointment    # Photodamage  - tretinoin  0.025% cream nightly    # AK   - LN2    # Personal history of skin cancer: Yes  NMSC  Cancer type Location Date of bx Accession # Procedure type/date   [facility] Left cheek 06/01/2023 [id number]  MMS Dr. Trixie 07/20/2023   [facility] Right Cheek 08/29/23  MMS Dr. Trixie 09/14/23     # FH NMSC - mother    # History of Breast Cancer S/p Radiation and Chemotherapy     # History of spinal fusion surgery    Subjective:             Cindy Randolph is a 63 y.o. female.    History of Present Illness  Return  PATIENT. Last seen 10/2023    Today, she presents with the following concerns:     -Red scaly bumps on left cheek and above left eyebrow, started within the past few months, in the same areas where she has gotten LN2 in past (left forehead)    Hasn't started tretinoin  yet because of back surgery. Plans to do so in wintertime.       Review of Systems   Constitutional:  Negative for appetite change, diaphoresis, fatigue, fever and unexpected weight change.   HENT:  Negative for congestion, mouth sores and sore throat.    Eyes:  Negative for pain, redness, itching and visual disturbance.   Respiratory:  Negative for cough and shortness of breath.    Cardiovascular:  Negative for palpitations and leg swelling.   Gastrointestinal:  Negative for abdominal pain, blood in stool, diarrhea, nausea and vomiting.   Genitourinary:  Negative for difficulty urinating and hematuria.   Musculoskeletal:  Negative for arthralgias and myalgias.   Neurological:  Negative for dizziness, seizures and facial asymmetry.   Hematological:  Does not bruise/bleed easily.   Psychiatric/Behavioral:  Negative for confusion and dysphoric mood. The patient is not nervous/anxious.      Objective:          acetaminophen  (TYLENOL ) 325 mg tablet Take two tablets by mouth every 6 hours. Take scheduled for 3 days after surgery, then as needed. Do not exceed 4,000mg  in a 24 hour period. (Patient taking differently: Take two tablets by mouth as Needed. Take scheduled for 3 days after surgery, then as needed. Do not exceed 4,000mg  in a 24 hour period.)    aspirin  81 mg chewable tablet Chew one tablet by mouth daily.    biotin 1 mg cap Take one capsule by mouth daily.    calcium  carbonate (OS-CAL) 1250 mg (500 mg elemental calcium ) tablet Take one tablet by mouth daily.    CHOLEcalciferoL (vitamin D3) 5000 unit tablet Take one tablet by mouth daily.    dexAMETHasone  (DECADRON ) 4 mg tablet Take one tablet by mouth twice daily. Take with food.    diclofenac  sodium (VOLTAREN ) 1 % topical gel Apply four g topically to affected area daily as needed.    ezetimibe  (ZETIA ) 10 mg tablet Take one tablet by mouth daily.    Fish,Bora,Flax Oils-OM3,6,9 #1 (TRIPLE OMEGA) 400-400-400 mg capsule Take one capsule by mouth at bedtime daily.    gabapentin  (NEURONTIN ) 100 mg capsule Take two capsules by mouth at bedtime daily.    HYDROcodone /acetaminophen  (NORCO) 5/325 mg tablet Take one tablet by mouth every 6 hours as needed for Pain.  inclisiran (LEQVIO ) 284 mg/1.5 mL injection syringe Inject 1.5 mL under the skin every 180 days. Initially then at 3 months and then every 6 months    iron  dextran complex (INFED  IJ) Inject  to area(s) as directed once.    methocarbamoL  (ROBAXIN ) 500 mg tablet Take one tablet by mouth three times daily.    metoprolol  succinate XL (TOPROL  XL) 50 mg extended release tablet Take 1.5 tablets by mouth at bedtime daily.    Miscellaneous Medical Supply misc DME requirements, rx to include the following typed information:    NPI: [id number]  Doctor's typed name: [name], MD  ICD 10: G47.33  Length of need: 99 months  Start date:02/14/2023  Signature date: 02/14/2023  Detailed equipment: CPAP machine with any accessories.    naltrexone  4.5 MG oral capsule (COMPOUND) Take one capsule by mouth daily. other medication Take one Dose by mouth daily. K Complete K1 & K2 as MK-4 & MK-7 : Take 1 softgel by mouth once daily    Selenium 100 mcg tab Take one tablet by mouth daily.    SUMAtriptan  succinate (IMITREX ) 25 mg tablet Take one tablet by mouth at onset of headache. May repeat after 2 hours if needed. Max of 200 mg in 24 hours. (Patient taking differently: as Needed for Migraine symptoms. Take one tablet by mouth at onset of headache. May repeat after 2 hours if needed. Max of 200 mg in 24 hours.)    SYNTHROID  100 mcg tablet Take one tablet by mouth daily 30 minutes before breakfast.    tretinoin  (RETIN-A ) 0.025 % topical cream Apply  topically to affected area at bedtime daily.    vit A/vit C/vit E/zinc/copper (PRESERVISION AREDS PO) Take 1 tablet by mouth daily.    vitamins, B complex tab Take one tablet by mouth daily.    vitamins, multiple cap Take one capsule by mouth daily.     Vitals:    04/30/24 0850   PainSc: Zero   Weight: 72.6 kg (160 lb)   Height: 160 cm (5' 3)         Body mass index is 28.34 kg/m?[name]     Physical Exam    Areas Examined (all normal unless noted below):  Head/Face    Pertinent findings include:    - left shoulder, tan/brown, waxy, stuck-on papule with comedo like openings, milia like cysts, and fingerprint/network like structures  - right cheek, with geometric, white, firm, flat plaque(s) without repigmentation or nodularity  - right upper back, with fleshy pedunculated papule  - left cheek/nasal sidewall, with geometric, white, firm, hypertrophic plaque(s) without repigmentation or nodularity  - Head/neck with brown, symmetric macules and papules with regular pigment network on dermoscopy  - Head/neck with tan and brown, waxy, stuck-on, thin papules  - Head/neck with photo-distributed tan feathery macules  - Scar formations in areas where Mohs performed on right and left cheek  - On left forehead: 3 gritty macules with surrounding erythema  - On left cheek: 1 gritty macule with surrounding erythema       Assessment and Plan:      # Actinic keratosis  Recommend treatment of lesions noted on exam today suspicious for actinic keratosis as they represent precancer and possible evolution to nonmelanoma skin cancer. Therapeutic options discussed; will proceed with empiric treatment as below. However, patient will contact this author if the lesions fail to resolve or worsen, changes unsatisfactorily, or symptoms arise. she verbalized understanding and is in agreement with this recommendation/plan.  -  LN2 -f/t/f x 3 lesions on 04/25, and today LN2- f/t/f x 3 lesions on left forehead and one lesion on left cheek      # History of basal cell carcinoma  - right cheek, s/p mohs with Dr. Trixie on 09/14/23  - left cheek s/p mohs with Dr. Trixie on 07/20/2023  - No evidence of recurrence today; scars present without repigmentation or nodularity  - This author recommends regular monitoring/observation at this time. Patient agreed, but will return if changes noted or symptoms arise. she verbalized understanding and is in agreement with this recommendation/plan.  - Counseled on appropriate sun avoidance by limiting outdoor activities between 10AM-4PM, applying sunscreen with SPF 30 or above (with reapplication if wiped off or per instructions on bottle), and using a physical blockade by protective clothing. The ABCDEs of melanoma was discussed.  - Patient's questions/concerns addressed\    # Melanocytic nevus  # Seborrheic keratosis  # Lentigo  # Diffuse photodamage (chronic, not at goal)  # Sebaceous hyperplasia  - Benign diagnosis, etiology, expected course/progression discussed with patient, who verbalized understanding.  - This author recommends monitoring/observation at this time. Patient agreed, but will return if changes noted or symptoms arise. she verbalized understanding and is in agreement with this recommendation/plan.  - Counseled on appropriate sun avoidance by limiting outdoor activities between 10AM-4PM, applying sunscreen with SPF 30 or above (with reapplication if wiped off or per instructions on bottle), and using a physical blockade by protective clothing. The ABCDEs of melanoma was discussed.  - Patient's questions/concerns addressed  - Start Rx: Tretinoin  0.025% topical cream nightly. Stop if too irritating. May sandwich with over-the-counter moisturizer     RTC 6 FBSE, sooner as needed    Liquid Nitrogen Procedure Note    Risk and benefits of the above procedure including pain, dyspigmentation, scar, infection, recurrence were discussed with the patient (or legal guardian) in detail, who afterwards decided to proceed with the procedure.    Verbal informed consent given  Diagnosis: AK  Body site: Left cheek and left forehead  Number of lesions: 3 lesions on left forehead, one on left cheek, 4 total  Cycle duration: 10 sec  Number or cycles: 2   Wound care instructions given: Yes  Complications:  None  Tolerated well:  Yes  Ambulated from room:  Yes  Duration of procedure: >          ATTESTATION    I personally performed or re-performed the history, physical exam and treatment for the E/M. I discussed the case with the Medical Student, and concur with the Medical Student documentation of history, physical exam and treatment plan unless otherwise noted.     Staff name:  Bard DELENA Cobble, MD Date: 04/30/2024     Bard RONAL Cobble, MD MSc FAAD  Assistant Professor - Division of Dermatology  Department of Internal Medicine  [facility]

## 2024-04-30 NOTE — Progress Notes [1]
 Date of Service: 04/30/2024     Dermatology Problem List:   # Dyshidrotic Eczema  - Clobetasol  0.5% ointment    # Photodamage  - tretinoin  0.025% cream    # AK   - LN2    # Personal history of skin cancer: Yes  NMSC  Cancer type Location Date of bx Accession # Procedure type/date   [facility] Left cheek 06/01/2023 [id number]  MMS Dr. Trixie 07/20/2023   [facility] Right Cheek 08/29/23  MMS Dr. Trixie 09/14/23     # FH NMSC - mother    # History of Breast Cancer S/p Radiation and Chemotherapy     Subjective:             Cindy Randolph is a 63 y.o. female.    History of Present Illness  Return  PATIENT. Last seen 08/2023 with Dr. Boris when the following was addressed:     # Actinic keratosis  -LN2 -f/t/f x 3 lesions at LV on 04/25    # History of basal cell carcinoma  - most recently right cheek, s/p mohs with Dr. Trixie on 09/14/23    # Melanocytic nevus  # Seborrheic keratosis  # Lentigo  # Diffuse photodamage (chronic, not at goal)  # Sebaceous hyperplasia  - Benign diagnosis, etiology, expected course/progression discussed with patient, who verbalized understanding.    She reports pulling of the skin with eye movement on left s/p MMS (tolerated well). Will ask plastic surgeon for second opinion re: options.    Today, she presents with the following concerns:     Itching, with bump  Location: left shoulder  Duration: several weeks ago  Associated local and/or systemic symptoms: itching, reports possible sunburn or reaction to sunscreen from recent trip   Palliative factors: none  Provocative factors: none  Prior evaluation: none  Previous treatment: none  Current treatment: none       Review of Systems   Constitutional:  Negative for appetite change, diaphoresis, fatigue, fever and unexpected weight change.   HENT:  Negative for congestion, mouth sores and sore throat.    Eyes:  Negative for pain, redness, itching and visual disturbance.   Respiratory:  Negative for cough and shortness of breath.    Cardiovascular:  Negative for palpitations and leg swelling.   Gastrointestinal:  Negative for abdominal pain, blood in stool, diarrhea, nausea and vomiting.   Genitourinary:  Negative for difficulty urinating and hematuria.   Musculoskeletal:  Negative for arthralgias and myalgias.   Neurological:  Negative for dizziness, seizures and facial asymmetry.   Hematological:  Does not bruise/bleed easily.   Psychiatric/Behavioral:  Negative for confusion and dysphoric mood. The patient is not nervous/anxious.      Objective:          acetaminophen  (TYLENOL ) 325 mg tablet Take two tablets by mouth every 6 hours. Take scheduled for 3 days after surgery, then as needed. Do not exceed 4,000mg  in a 24 hour period. (Patient taking differently: Take two tablets by mouth as Needed. Take scheduled for 3 days after surgery, then as needed. Do not exceed 4,000mg  in a 24 hour period.)    aspirin  81 mg chewable tablet Chew one tablet by mouth daily.    biotin 1 mg cap Take one capsule by mouth daily.    calcium  carbonate (OS-CAL) 1250 mg (500 mg elemental calcium ) tablet Take one tablet by mouth daily.    CHOLEcalciferoL (vitamin D3) 5000 unit tablet Take one tablet by mouth daily.  dexAMETHasone  (DECADRON ) 4 mg tablet Take one tablet by mouth twice daily. Take with food.    diclofenac  sodium (VOLTAREN ) 1 % topical gel Apply four g topically to affected area daily as needed.    ezetimibe  (ZETIA ) 10 mg tablet Take one tablet by mouth daily.    Fish,Bora,Flax Oils-OM3,6,9 #1 (TRIPLE OMEGA) 400-400-400 mg capsule Take one capsule by mouth at bedtime daily.    gabapentin  (NEURONTIN ) 100 mg capsule Take two capsules by mouth at bedtime daily.    HYDROcodone /acetaminophen  (NORCO) 5/325 mg tablet Take one tablet by mouth every 6 hours as needed for Pain.    inclisiran (LEQVIO ) 284 mg/1.5 mL injection syringe Inject 1.5 mL under the skin every 180 days. Initially then at 3 months and then every 6 months    iron  dextran complex (INFED  IJ) Inject  to area(s) as directed once.    methocarbamoL  (ROBAXIN ) 500 mg tablet Take one tablet by mouth three times daily.    metoprolol  succinate XL (TOPROL  XL) 50 mg extended release tablet Take 1.5 tablets by mouth at bedtime daily.    Miscellaneous Medical Supply misc DME requirements, rx to include the following typed information:    NPI: [id number]  Doctor's typed name: [name], MD  ICD 10: G47.33  Length of need: 99 months  Start date:02/14/2023  Signature date: 02/14/2023  Detailed equipment: CPAP machine with any accessories.    naltrexone  4.5 MG oral capsule (COMPOUND) Take one capsule by mouth daily.    other medication Take one Dose by mouth daily. K Complete K1 & K2 as MK-4 & MK-7 : Take 1 softgel by mouth once daily    Selenium 100 mcg tab Take one tablet by mouth daily.    SUMAtriptan  succinate (IMITREX ) 25 mg tablet Take one tablet by mouth at onset of headache. May repeat after 2 hours if needed. Max of 200 mg in 24 hours. (Patient taking differently: as Needed for Migraine symptoms. Take one tablet by mouth at onset of headache. May repeat after 2 hours if needed. Max of 200 mg in 24 hours.)    SYNTHROID  100 mcg tablet Take one tablet by mouth daily 30 minutes before breakfast.    tretinoin  (RETIN-A ) 0.025 % topical cream Apply  topically to affected area at bedtime daily.    vit A/vit C/vit E/zinc/copper (PRESERVISION AREDS PO) Take 1 tablet by mouth daily.    vitamins, B complex tab Take one tablet by mouth daily.    vitamins, multiple cap Take one capsule by mouth daily.     There were no vitals filed for this visit.      There is no height or weight on file to calculate BMI.     Physical Exam    Areas Examined (all normal unless noted below):  Head/Face    Pertinent findings include:    - left shoulder, tan/brown, waxy, stuck-on papule with comedo like openings, milia like cysts, and fingerprint/network like structures  - right cheek, with geometric, white, firm, flat plaque(s) without repigmentation or nodularity  - right upper back, with fleshy pedunculated papule  - left cheek/nasal sidewall, with geometric, white, firm, hypertrophic plaque(s) without repigmentation or nodularity  - Head/neck with brown, symmetric macules and papules with regular pigment network on dermoscopy  - Head/neck with tan and brown, waxy, stuck-on, thin papules  - Head/neck with photo-distributed tan feathery macules         Assessment and Plan:    # Acrochordon, inflamed  # Disturbance  of skin sensation  - This benign diagnosis was reviewed with the patient and reassurance was provided.    - Due to the symptomatic nature of the lesion (s), we will treat empirically with cryotherapy. Further investigation, such as biopsy, is in reserve if the condition fails to resolve. However, I advised that if her condition worsens, does not improve, associated symptoms arise, or otherwise changes in any way, she should contact us  at her earliest convenience and by her method of choice before follow up as defined in today's visit. she verbalized understanding and is in agreement with this recommendation/plan.    Procedure: Cryotherapy, inflamed acrochordon  Location(s): right upper back x1  Number of lesions treated: 1  Lesions treated with liquid nitrogen today x 2-3 cycles after verbal consent was obtained from the patient including discussion of the risks including but not limited to scar, post-inflammatory pigment changes, infection, blister, pain, recurrence, and further treatment.  Wound care instructions discussed.    This dino believes there is a medical necessity for treatment because the lesion has the following characteristics:  Bleeding no   Pain  no   Persistent or intense itching yes   Physical evidence of inflammation (i.e. erythema) yes   Lesion is in an atomic area subject to recurrent physical trauma, and there is documentation that such trauma occurred yes     # Inflamed/Irritated Seborrheic Keratosis   - Reassured that lesion(s) appear clinically benign and treatment is optional  - Liquid nitrogen applied for 2 freeze-to-thaw cycles   - Crusting and even blistering is expected, followed by resolution of the lesion(s)  -  Apply white petrolatum  to speed healing     DESTRUCTION OF BENIGN LESIONS   Verbal consent obtained.  TYPE OF DESTRUCTION: Cryotherapy  DIAGNOSIS: Inflamed seborrheic keratosis  LOCATION OF LESIONS: left shoulder x1  NUMBER OF LESIONS TREATED: 1  Wound care instructions provided. Patient tolerated the procedure well.    # Actinic keratosis  Recommend treatment of lesions noted on exam today suspicious for actinic keratosis as they represent precancer and possible evolution to nonmelanoma skin cancer. Therapeutic options discussed; will proceed with empiric treatment as below. However, patient will contact this author if the lesions fail to resolve or worsen, changes unsatisfactorily, or symptoms arise. she verbalized understanding and is in agreement with this recommendation/plan.  -LN2 -f/t/f x 3 lesions at LV on 04/25    # History of basal cell carcinoma  - most recently right cheek, s/p mohs with Dr. Trixie on 09/14/23  - No evidence of recurrence today; scars present without repigmentation or nodularity  - This author recommends regular monitoring/observation at this time. Patient agreed, but will return if changes noted or symptoms arise. she verbalized understanding and is in agreement with this recommendation/plan.  - Counseled on appropriate sun avoidance by limiting outdoor activities between 10AM-4PM, applying sunscreen with SPF 30 or above (with reapplication if wiped off or per instructions on bottle), and using a physical blockade by protective clothing. The ABCDEs of melanoma was discussed.  - Patient's questions/concerns addressed\    # Melanocytic nevus  # Seborrheic keratosis  # Lentigo  # Diffuse photodamage (chronic, not at goal)  # Sebaceous hyperplasia  - Benign diagnosis, etiology, expected course/progression discussed with patient, who verbalized understanding.  - This author recommends monitoring/observation at this time. Patient agreed, but will return if changes noted or symptoms arise. she verbalized understanding and is in agreement with this recommendation/plan.  - Counseled on appropriate sun avoidance  by limiting outdoor activities between 10AM-4PM, applying sunscreen with SPF 30 or above (with reapplication if wiped off or per instructions on bottle), and using a physical blockade by protective clothing. The ABCDEs of melanoma was discussed.  - Patient's questions/concerns addressed  - Start Rx: Tretinoin  0.025% topical cream nightly. Stop if too irritating. May sandwich with over-the-counter moisturizer     RTC 6 FBSE, sooner as needed    In the presence of Bard DELENA Cobble, MD,  I have taken down these notes, Alisha, Scribe. 04/30/2024 8:50 AM            Bard RONAL Cobble, MD MSc FAAD  Assistant Professor - Division of Dermatology  Department of Internal Medicine  [facility]

## 2024-04-30 NOTE — Patient Instructions [37]
 Thank you for choosing [facility]. I am happy that you are allowing me to participate in your care.     A summary of the recommendations made at today's visit is below:  CRYOTHERAPY INSTRUCTIONS    What should I expect after cryosurgery?  2 -- 24 hours:  Redness and swelling occurs almost immediately; a firm blister may form that may contain blood or clear fluid.    1 -- 2 days:  The blister (if present) may break and a clear to yellowish drainage and/or crust may develop.  Some bleeding may occur.  The site should not be noticeably painful.  3 -- 10 days:  Crusting and drainage will continue, but the swelling will decrease.  The redness will begin resolving.  10 days - 3 weeks:  Healing will continue, with residual redness.  3 - 6 weeks:  Complete healing.    How do I care for the treated area?  Wash the treated areas daily. Allow soapy water to run over the areas, but do not scrub.  Should a scab or crust form, allow it to fall off on its own - do not remove or ?pick? at it.  The crust is protective to the new, healthy tissue growing underneath.    Application of ointment and a bandage may make you feel more comfortable, but is not necessary. If you would like to use an ointment to the cryotherapy site, we recommend Vaseline (petroleum jelly) or Aquaphor/Eucerin healing ointment     Will there be pain?  Pain is typically minimal. If you experience pain following the procedure, and your other medical conditions allow, Tylenol may be used.      If pain is severe, contact your physician.     Long term cryotherapy care  The cryotherapy site will be more sun sensitive than your surrounding skin.  Keep it covered, and remember to apply sun screen every day to all your sun exposed skin.  A scar may remain which is lighter or pinker than your normal skin. Your body will continue to improve your scar for up to one year, however a light colored scar may remain.  Infection following cryotherapy is rare. However, if you are worried by the appearance the treated area, contact your doctor.     General recommendations  Applying creams/ointments  If you were prescribed a topical medication, a small amount will suffice. A line of cream/ointment placed on your fingertip to the first joint (where the fingertip bends) should be sufficient to cover a surface area of the size of the palm of your hand. You do not need a thick layer the prescription cream/ointment.     Your rash may return after application of the prescription topical medicine. Repeat the course of treatment as discussed in your visit with me. Please let me know if you don't get enough relief, the rash spreads, or if new or worsening symptoms develop.    Gentle skin care for everyday  - Bathe with lukewarm water, 5-10 minutes. Dry thoroughly afterward by patting yourself with a towel. Avoid heavy rubbing  - Use a mild soap, such as Dove, Cetaphil, or any fragrance-free product  - Apply a moisturizer of your choice as much as you can (there's no limit like in prescription topical medications), but at least 2 times a day (even if no bath is taken) and after every bath.  After the bath, pat dry and apply the moisturizer right away. Use a thick moisturizer that says cream  or ointment (not lotion that comes in a pump bottle - this is not as moisturizing, but does work). The stickier the better (like Vaseline or Aquaphor)! However, find one that you like and will use.  - For your face, you may use a gentle cleanser (such as CeraVe or Cetaphil just as examples) or plain water, followed by a non-comedogenic (meaning it won't clog pores) moisturizer with sunscreen SPF 30 or higher. A mineral sunscreen with zinc, titanium, or iron oxide are preferable      Sun protection is KEY to success in taking care of your skin!  Using sunscreens and sun avoidance will decrease your risk of developing skin cancers like melanoma, as well as prevent developing signs of aging like wrinkles, redness, and brown/dark spots.  - Avoid the sun at peak hours of the day (10 AM - 4 PM)   - Wear sunscreen SPF 30 or above. It should be reapplied every 2 hours if possible, or when you wipe off with a towel after swimming  - Use sunprotective clothing (shirts with long sleeves, pants, and wide-brimmed cowboy style hat). Baseball caps don't work well enough!    ** FOR MOISTURIZERS AND SUNSCREENS: I RECOMMEND ANY PRODUCT THAT YOU LIKE AND WILL USE CONSISTENTLY -- ANY BRAND/PRICE IS ACCEPTABLE **    Moles and other brown spots   - Common melanocytic nevi, or moles, tend to be less than 6 mm in diameter (size of a pencil eraser) and symmetric with even pigmentation, round or oval shape, regular outline, and sharp, non-fuzzy border. They can stick out from the skin.  - Flaky, seemingly stuck-on brown bumps are usually benign/non-cancerous keratoses, not moles. You will likely get more of these with time.  - Bright red smooth bumps that do not bleed are usually benign blood vessel lesions (cherry angiomas), not moles.  - It is normal to get new moles until the age of 40-98 years old.    Please contact me if you notice the following:  - A mole that differs from others (an ugly duckling that has an odd shape, uneven or uncertain border, different colors), or one that changes, bleeds, or itches.  - Any growth that has one or more of the following features:  A - Asymmetry. (Concerning if spot is not symmetric--one half is unlike the other half)  B - Border. (Irregular, notched, or poorly defined border are concerning)  C - Color. (Multiple colors [shades of tan, brown or black, or is sometimes white, red, or blue] or changes in color are concerning)  D - Diameter. (Larger than 6mm, i.e., a pencil eraser)  E - Evolution. (An evolving or changing spot, which looks different from others on your body. If there's a new itch, tenderness, or bleeding develop, these are concerning changes too.  - An uneven, new, or large (greater than 3mm) brown or black streak underneath a fingernail or toenail, which may or may not spread onto the skin next to the nail  - A sore that just won't heal after about 4 weeks    Cost of prescriptions  We do everything that we can to get your prescriptions approved through your insurance company. Unfortunately, our efforts may not always work. I understand this is frustrating. Some options to help make your prescription medications more affordable include GoodRx (https://www.goodrx.com/) and [facility] (https://costplusdrugs.com/medications/categories/hair-&-skin-health/). Please let me know if your prescription is too expensive at your regular pharmacy and we can try one of those  options. Even if it is covered by your insurance, your pharmacy may not communicate with [place].          Contact info  Routine - Please don't hesitate to let me know if you have additional questions or concerns before your next visit. I recommend directing your inquiries through MyChart. However, if you do not have MyChart access or prefer calling, the phone number is [phone], Option 4.   Urgent - For urgent, after-hours/weekend issues that are not emergencies, please call the [facility] Operator at [phone]. Ask to have paged the dermatologist on call. You will receive a call back from the dermatologist.  Emergency - Please call 911 or go to the nearest Emergency Department for all medical emergencies.         Zada Finders, MD MSc FAAD  Assistant Professor - Dermatology  Department of Internal Medicine  [facility]

## 2024-05-03 ENCOUNTER — Encounter: Admit: 2024-05-03 | Discharge: 2024-05-03 | Payer: MEDICARE

## 2024-05-03 DIAGNOSIS — G43109 Migraine with aura, not intractable, without status migrainosus: Principal | ICD-10-CM

## 2024-05-03 MED ORDER — SUMATRIPTAN SUCCINATE 25 MG PO TAB
ORAL_TABLET | SUBCUTANEOUS | 0 refills | 30.00000 days | Status: AC
Start: 2024-05-03 — End: ?

## 2024-05-03 NOTE — Telephone Encounter [36]
 SUMAtriptan  succinate (IMITREX ) 25 mg tablet         Sig: Take one tablet by mouth at onset of headache. May repeat after 2 hours if needed. Max of 200 mg in 24 hours.    Disp: 9 tablet    Refills: 0    Start: 05/03/2024    Class: Normal    For: Migraine with aura and without status migrainosus, not intractable    Last ordered: 11 months ago (05/23/2023) by Cindy Stephanie Cindy Laraine, MD    Neurology: Migraine Therapy - Sumatriptan  Passed12/03/2024 03:23 PM   Protocol Details Valid encounter within last 12 months    BP completed in the last 12 months    AST in normal range and within 360 days    ALT in normal range and within 360 days      To be filled at: [id number] - [place], [place] - [address] AT [facility] [address]           1. Received fax from pharmacy requesting new Rx for   Requested Prescriptions     Pending Prescriptions Disp Refills    SUMAtriptan  succinate (IMITREX ) 25 mg tablet 9 tablet 0     Sig: Take one tablet by mouth at onset of headache. May repeat after 2 hours if needed. Max of 200 mg in 24 hours.   .    2. Last Rx written: 05/23/23  (#9 x0) by PCP    3. Last Gen Med Visit: in person  on 03/07/24    4. Next Appt due: Provider recommended return date from last office visit: Return in about 6 months (around 09/05/2024) for Follow-up [CODE 226] - 20 min visit.    Future Appointments   Date Time Provider Department Center   06/19/2024 10:00 AM Susa Orlean DELENA ARDIA CVMCLOP CVM Exam   06/22/2024 10:30 AM Sherlyn Eleanor BRAVO, MD QVUROBGY OB/GYN   06/28/2024 10:30 AM Karan Kate SAUNDERS, APRN-NP [id number] CVM Exam   07/20/2024  8:00 AM Cindy Ruben SQUIBB, MD [facility]   09/05/2024  9:40 AM Cindy Randolph Cindy Stephanie, MD MPGENMED IM   10/16/2024  1:45 PM INF05 KCMOIT None   10/22/2024  1:30 PM Raford Edsel SAILOR, APRN-NP CCC2 [place] Exam   10/24/2024  8:00 AM Boris Bard DELENA, MD [id number] IM   02/08/2025 10:00 AM Cindy Elida NOVAK, APRN-NP [facility]   05/06/2025  1:00 PM Cindy Tinnie BRAVO, MD CCC2 [place] Exam       5. All protocol criteria met? Yes, last office visit assessment and plan reviewed.     Reason Protocol Failed: N/A    Courtesy Refills: N/A    Patient due for an office visit in 1,2, or 3 months 90 day supply, 0 refills   Patient due for an office visit in 4,5, or 6 months 90 day supply, 1 refill   Patient due for an office visit in 7,8, or 9 months 90 day supply, 2 refills   Patient due for an office visit in 10,11, or 12 months 90 day supply, 3 refills

## 2024-05-04 ENCOUNTER — Encounter: Admit: 2024-05-04 | Discharge: 2024-05-04 | Payer: MEDICARE

## 2024-05-08 ENCOUNTER — Encounter: Admit: 2024-05-08 | Discharge: 2024-05-08 | Payer: MEDICARE

## 2024-05-09 ENCOUNTER — Encounter: Admit: 2024-05-09 | Discharge: 2024-05-09 | Payer: MEDICARE

## 2024-05-09 MED ORDER — EZETIMIBE 10 MG PO TAB
10 mg | ORAL_TABLET | Freq: Every day | ORAL | 3 refills | 90.00000 days | Status: AC
Start: 2024-05-09 — End: ?

## 2024-05-23 ENCOUNTER — Encounter: Admit: 2024-05-23 | Discharge: 2024-05-23 | Payer: MEDICARE

## 2024-05-23 DIAGNOSIS — R109 Unspecified abdominal pain: Secondary | ICD-10-CM

## 2024-05-23 DIAGNOSIS — R630 Anorexia: Principal | ICD-10-CM

## 2024-05-23 NOTE — Progress Notes [1]
 Telehealth Visit Note    Computed Telehealth Body Mass Index unavailable. One or more values for this score either were not found within the given timeframe or did not fit some other criterion.  Physical Exam       Cindy Randolph is a 63 y.o. female.    Chief Complaint:  Chief Complaint   Patient presents with    Malaise       History of Present Illness:  HPI       Cindy Randolph is a 63 year old female who presents with persistent gastrointestinal symptoms following a recent GI bug.    Gastrointestinal symptoms  - Persistent gastrointestinal symptoms for two weeks following a GI illness  - Unable to consume substantial meals; diet limited to bland foods including eggs, plain yogurt, bland crackers, sugar cookies, and chicken broth  - Severe lower abdominal cramps occurring approximately two hours postprandially  - No diarrhea  - No fever since initial onset of symptoms  - Bowel movements normal, occurring once daily, with slight initial decrease in frequency  - No history of pancreatitis    Generalized weakness and fatigue  - Muscle weakness requiring frequent rest  - Overall feeling of weakness  - No engagement in physical activities such as walking due to cold weather    Medication history and opioid taper  - Recently tapered off hydrocodone  prescribed postoperatively for back surgery on October 23rd  - Initial regimen: two hydrocodone  every four hours for two weeks, then gradual dose reduction  - Son suggested possible opioid withdrawal, but medication was tapered slowly    Nausea and use of antiemetics  - Intermittent nausea  - Zofran  provided some relief, aiding sleep    Dietary modifications and probiotics  - Occasional use of probiotics  - Previously consumed Greek yogurt with berries regularly before surgery    Surgical history  - History of hiatal hernia surgery approximately one year ago, which resolved GERD symptoms  - Does not regularly take heartburn medication    Exposure history  - Friend experienced similar symptoms, suggesting possible viral etiology    Symptom trajectory  - No worsening of symptoms, but persistent lack of improvement  - Frustration with ongoing symptoms              Review of Systems:  Review of Systems    Allergies:  Allergies[1]        Social History:  Social History     Tobacco Use    Smoking status: Never     Passive exposure: Past    Smokeless tobacco: Never    Tobacco comments:     Significant second hand smoke from parents 76-54 years old   Vaping Use    Vaping status: Never Used   Substance Use Topics    Alcohol use: Not Currently     Comment: Prior to 2020, socially, one drink once a week at most    Drug use: Not Currently     Types: Marijuana     Comment: Not since minimal experimentation at college parties     Social History     Substance and Sexual Activity   Drug Use Not Currently    Types: Marijuana    Comment: Not since minimal experimentation at college parties             Family History:  Family History   Problem Relation Name Age of Onset    Arthritis Paternal Grandmother Blima Molt  multiple heberdens nodules and deformities    Back pain Paternal Grandmother Blima Molt     Arthritis-osteo Paternal Grandmother Blima Molt     Diabetes Paternal Grandmother Blima Molt         Type 2 as older adult    Arthritis Mother Vertell Pines         wear and tear    Back pain Mother Vertell Pines     Hypertension Mother Vertell Pines     Joint Pain Mother Vertell Pines     Neck Pain Mother Vertell Pines     Cancer-Breast Mother Vertell Pines 64        was on HRT for 10-15 years    Cancer Mother Vertell Pines         Breast CA, post menopausal, estrogen-sensitive    Miscarriages Mother Liz Greene         3-5 and a stillbirth    Basal Cell Carcinoma Brother Alm (Brother)     Back pain Brother Alm (Brother)         Has had multiple laminectomy surgeries    Hypertension Brother Alm (Brother)     Asthma Brother Alm (Brother)         Worst when a child Diabetes Brother Alm (Brother)         Non-insulin dependent, 2022    Basal Cell Carcinoma Brother Chip     Back pain Brother Chip         Arthritis    Hypertension Brother Chip     Diabetes Brother Chip         Non-insulin dependent, 2023    Basal Cell Carcinoma Brother Peggye     Back pain Brother Peggye         Arthritis    Hypertension Brother Peggye     Heart problem Brother Peggye         Stent in LAD, positive apo(a)    Diabetes Brother Peggye         Non insulin dependent, 2023    Diabetes Father Eveline Pines         Type 2    Heart problem Father Eveline Pines         CABG 3    Heart Disease Father Eveline Pines         Occult MI in early 95s; later found with CAD; subsequent Triple CABG. In hos 60s started amiodarone    Hypertension Father Eveline Pines     Alcohol abuse Father Eveline Pines     Early Death Father Eveline Pines         Age 66, complications diabetes and heart disease    Diabetes Maternal Grandfather Fairy Rob         Type 2 as older adult    Hypertension Maternal Grandfather Fairy Rob         I remember he was on a severe salt-restricted diet    Birth Defect Daughter Ronal Pries         PRS, congenital diaphragmatic hernia, malrotation, grey matter heterotopia    Stroke Maternal Uncle Norleen Rob         In his 35s    Thyroid Disease Maternal Uncle Norleen Rob     Diabetes Paternal Grandfather Grandpa     Birth Defect Nephew          craniosynostosis    Thyroid Disease Maternal Uncle Visual Merchandiser         Goiter  Cancer-Breast Maternal Great-Aunt  58    Cancer Other Gerre Sage         Mother?s maternal aunt    Cancer Brother Alm (Brother)         Prostate, Stage 2    High Cholesterol Brother Peggye         High apolipoprotein little a    Coronary Artery Disease Father Eveline Pines         Had triple CABG    Cancer-Skin Brother Chip         Removed    Cancer Other Almarie Sage         Mother?s maternal aunt    Stroke Brother          Found in retrospect; micro    Thyroid Disease Other Zell Rob         Mother?s brother; Hx goiter    Miscarriage Mother Vertell Pines         Multiple, and one stillbirth    Miscarriage Daughter Amelie Shannan Piety         Has PCOS, and first pregnancy ended spontaneously at 8 weeks. Since then, 2 heathy pregnancies/deliveries/children    Sudden Cardiac Death Father Eveline Pines         1995; was recently put on amiodarone    Heart Attack Father Eveline Pines         42    Growth/Development Disorders Daughter Ronal Pries         Since birth    Scoliosis Daughter Ronal Pries         Birth    ADD/ADHD Brother Alm (Brother)         6    Scoliosis Daughter Ronal Pries     Dementia Maternal Grandmother Amelie Rob         4    ADD/ADHD Son Max         5    Bipolar Disorder Son Gus         12    ADD/ADHD Son Gus         6    ADD/ADHD Gilmer Flores         4    ADD/ADHD Daughter Amelie Shannan Piety         24          acetaminophen  (TYLENOL ) 325 mg tablet Take two tablets by mouth every 6 hours. Take scheduled for 3 days after surgery, then as needed. Do not exceed 4,000mg  in a 24 hour period. (Patient taking differently: Take two tablets by mouth as Needed. Take scheduled for 3 days after surgery, then as needed. Do not exceed 4,000mg  in a 24 hour period.)    aspirin  81 mg chewable tablet Chew one tablet by mouth daily.    biotin 1 mg cap Take one capsule by mouth daily.    calcium  carbonate (OS-CAL) 1250 mg (500 mg elemental calcium ) tablet Take one tablet by mouth daily.    CHOLEcalciferoL (vitamin D3) 5000 unit tablet Take one tablet by mouth daily.    dexAMETHasone  (DECADRON ) 4 mg tablet Take one tablet by mouth twice daily. Take with food.    diclofenac  sodium (VOLTAREN ) 1 % topical gel Apply four g topically to affected area daily as needed.    ezetimibe  (ZETIA ) 10 mg tablet Take one tablet by mouth daily.    Fish,Bora,Flax Oils-OM3,6,9 #1 (TRIPLE OMEGA) 400-400-400 mg capsule Take one capsule by mouth at bedtime daily.    gabapentin  (  NEURONTIN ) 100 mg capsule Take two capsules by mouth at bedtime daily.    HYDROcodone /acetaminophen  (NORCO) 5/325 mg tablet Take one tablet by mouth every 6 hours as needed for Pain.    inclisiran (LEQVIO ) 284 mg/1.5 mL injection syringe Inject 1.5 mL under the skin every 180 days. Initially then at 3 months and then every 6 months    iron  dextran complex (INFED  IJ) Inject  to area(s) as directed once.    methocarbamoL  (ROBAXIN ) 500 mg tablet Take one tablet by mouth three times daily.    metoprolol  succinate XL (TOPROL  XL) 50 mg extended release tablet Take 1.5 tablets by mouth at bedtime daily.    Miscellaneous Medical [employer] misc DME requirements, rx to include the following typed information:    NPI: [id number]  Doctor's typed name: [name], MD  ICD 10: G47.33  Length of need: 99 months  Start date:02/14/2023  Signature date: 02/14/2023  Detailed equipment: CPAP machine with any accessories.    naltrexone  4.5 MG oral capsule (COMPOUND) Take one capsule by mouth daily.    other medication Take one Dose by mouth daily. K Complete K1 & K2 as MK-4 & MK-7 : Take 1 softgel by mouth once daily    Selenium 100 mcg tab Take one tablet by mouth daily.    SUMAtriptan  succinate (IMITREX ) 25 mg tablet Take one tablet by mouth at onset of headache. May repeat after 2 hours if needed. Max of 200 mg in 24 hours.    SYNTHROID  100 mcg tablet Take one tablet by mouth daily 30 minutes before breakfast.    tretinoin  (RETIN-A ) 0.025 % topical cream Apply  topically to affected area at bedtime daily.    vit A/vit C/vit E/zinc/copper (PRESERVISION AREDS PO) Take 1 tablet by mouth daily.    vitamins, B complex tab Take one tablet by mouth daily.    vitamins, multiple cap Take one capsule by mouth daily.         Physical Exam:  Physical Exam  Constitutional:       General: She is not in acute distress.     Appearance: Normal appearance. She is normal weight. She is not ill-appearing, toxic-appearing or diaphoretic.   HENT: Head: Normocephalic and atraumatic.   Pulmonary:      Effort: Pulmonary effort is normal.   Neurological:      Mental Status: She is alert and oriented to person, place, and time.   Psychiatric:         Mood and Affect: Mood normal.         Behavior: Behavior normal.         Thought Content: Thought content normal.         Judgment: Judgment normal.         Procedures        Assessment and Plan:    Clinical Impression:  Cindy Randolph is a 63 y.o. female with a history of recent back surgery (October) and prior hiatal hernia repair presents with two weeks of persistent anorexia, postprandial lower abdominal cramping, and generalized weakness following an acute gastrointestinal illness. She reports no diarrhea or fever since the initial episode, and her oral intake has been limited to bland foods. She recently completed a gradual opioid taper after her surgery. No current symptoms of GERD or pancreatitis were reported.    Differential diagnosis includes, but is not limited to:  - Prolonged post-infectious gastroenteritis: Persistent gastrointestinal symptoms following a presumed viral illness are consistent with  post-infectious gastroenteritis, especially given the absence of ongoing fever or diarrhea and the temporal association with a known GI bug exposure.  - Gut flora depletion: Recent illness, limited diet, and possible post-surgical immune suppression may have contributed to altered gut flora, potentially prolonging GI symptoms.  - Opioid withdrawal or post-op recovery effects: Recent opioid taper and major surgery may contribute to delayed GI recovery and generalized weakness, though the taper was gradual and completed five weeks ago.  - Gastroesophageal reflux disease (GERD) recurrence: Although the patient denies current heartburn and had prior surgical correction, empiric therapy for possible atypical reflux was considered due to persistent GI symptoms.    Prolonged post-infectious gastroenteritis with anorexia and abdominal cramping  - Initiated probiotic regimen  - Encouraged consumption of Greek yogurt with active cultures  - Advised intake of high-calorie, nutrient-dense foods, focusing on protein  - Considered trial of Tums or Protonix  for potential reflux symptoms  - Encouraged follow-up appointment with primary care provider  - Advised outdoor walks for vitamin D exposure and to stimulate peristalsis          Disposition/Follow up       1. Loss of appetite        2. Abdominal cramping            Medications:  No orders of the defined types were placed in this encounter.          Patient Instructions:    There are no Patient Instructions on file for this visit.      12 minutes spent on this patient's encounter with counseling and coordination of care taking >50% of the visit.       [1]   Allergies  Allergen Reactions    Mango ANAPHYLAXIS    Other [Unclassified Drug] ANAPHYLAXIS     DUCK Meat and Eggs     Imdur  [Isosorbide  Mononitrate] CHEST TIGHTNESS and RASH    Iodinated Contrast Media RASH and ITCHING     04/2023 pt developed throat itching after IV Contrast in CT, itching subsided after 5 minutes without intervention.   She developed rash on back and neck after hospitalization 06/2022 where she underwent heart cath. Unsure if contrast was   cause of rash, but it developed shortly after procedure.    Macrobid  [Nitrofurantoin  Monohyd/M-Cryst] SEE COMMENTS     Patient has pulmonary fibrosis so this antibiotic is not preferred unless this is the only medication available .     Oxycodone  NAUSEA ONLY     Prefers tramadol     Oxycodone -Acetaminophen  NAUSEA ONLY    Sudafed [Pseudoephedrine Hcl] PALPITATIONS

## 2024-05-28 ENCOUNTER — Encounter: Admit: 2024-05-28 | Discharge: 2024-05-28 | Payer: MEDICARE

## 2024-05-29 ENCOUNTER — Encounter: Admit: 2024-05-29 | Discharge: 2024-05-29 | Payer: MEDICARE

## 2024-05-29 ENCOUNTER — Ambulatory Visit: Admit: 2024-05-29 | Discharge: 2024-05-29 | Payer: MEDICARE

## 2024-05-29 VITALS — BP 122/80 | HR 74 | Temp 97.70000°F | Ht 62.6 in | Wt 159.2 lb

## 2024-05-29 DIAGNOSIS — E782 Mixed hyperlipidemia: Secondary | ICD-10-CM

## 2024-05-29 DIAGNOSIS — R5383 Other fatigue: Secondary | ICD-10-CM

## 2024-05-29 DIAGNOSIS — M5441 Lumbago with sciatica, right side: Secondary | ICD-10-CM

## 2024-05-29 MED ORDER — TRAMADOL 50 MG PO TAB
25 mg | ORAL_TABLET | Freq: Two times a day (BID) | ORAL | 0 refills | 7.00000 days | Status: AC | PRN
Start: 2024-05-29 — End: ?

## 2024-05-29 MED ORDER — ONDANSETRON 4 MG PO TBDI
4 mg | ORAL_TABLET | ORAL | 0 refills | 8.00000 days | Status: AC | PRN
Start: 2024-05-29 — End: ?

## 2024-05-29 NOTE — Assessment & Plan Note [38]
 Follows with cardiology  Discontinued statin (crestor ) due to concern about muscle pain.   Currently on Zetia  10 mg daily and inclisiran.  -Continue current regimen.

## 2024-05-29 NOTE — Assessment & Plan Note [38]
 Previously managed with hydrocodone  post surgery. Ibuprofen causes gastrointestinal upset, advised to take with food. - Prescribed tramadol  for severe pain as needed.  - Advised taking ibuprofen with food to minimize gastrointestinal upset.

## 2024-05-29 NOTE — Patient Instructions [37]
 Routine Clinic Information:    Make Checking in faster by using MyChart ahead of time by using the pre check in and complete the questionnaires. This will make your next visit Check in faster.      Please don't hesitate to call if you have any problems or questions.   My nurse, Rayssa Atha, RN, can be reached at [phone].  If she does not answer, please leave a voicemail as she is probably rooming other patients. Please leave your name, the spelling of your last name, date of birth, phone number they can call you back and a description of why you are calling. You may also message us  in MyChart.    Our fax number is [phone].     Medication refills:  Please use the MyChart Refill request or contact your pharmacy directly to request medication refills.  Please allow at least 3 business days for refill requests  .  Lab work:  The main lab is on the 1st floor of the [facility].  [address], [address]  [place], [place] [address]  LAB HOURS  Mon 7 a.m. - 6 p.m.  Tues 7 a.m. - 6 p.m.  Wed 7 a.m. - 6 p.m.  Thur 7 a.m. - 6 p.m.  Fri 7 a.m. - 6 p.m.  Sat 7 a.m. - noon  Sun Closed -  It is walk in only, they do not take appointments:  Other lab locations are available; please see [email or web address]    Test results:  You will receive your test results at your appointment, by MyChart (our patient portal), or via phone or letter.   We prefer to use MyChart as much as possible.  If you are expecting results and have not heard from my office within 2 weeks of your testing, please send a MyChart message or call my office.    As a part of the CARES act, starting 08/23/2019, some results will be released to mychart automatically.  With these changes you may see your results before I do.  Critical lab results will be addressed immediately, but otherwise please  give me 72 hours to view and respond to your results before reaching out with questions. Radiology is on the 2nd floor of the [address], among several other locations.  Please contact the radiology department to schedule at [phone].    Scheduling is available directly through MyChart, or via phone at [phone].  Same day and urgent care appointments are available.    We offer same day appointments for your acute health concerns. These appointments are on a first come, first serve basis. Please call [phone] if you would like to make an appointment.   Appointment reminders may be received over the phone and by text.  Communication preferences can be managed in MyChart to ensure you receive important appointment notifications.    Support for many chronic illnesses is available through [employer]: [employer] or 5081742004.    We offer Integrated Behavioral Health services, nutrition support, and pharmacist support services free of charge.  To schedule an appointment with a specialist and/or testing please call the central scheduling number at [phone].     You may see my nurse practitioner, Tinnie Shock, for urgent needs or if I am unavailable.  We are working as a team to provide better continuity and access to our patients.      If you ever have emergency symptoms of chest pain, shortness of breath or uncontrolled or unexplained pain, please go to  your closest emergency room.  You can give us  an update after you have addressed any emergency.        For urgent issues after business hours/weekends/holidays call [phone] and request for the outpatient internal medicine physician to be paged.

## 2024-05-29 NOTE — Assessment & Plan Note [38]
 Follows with endocrinology  Well managed with Levothyroxine  100mg  daily

## 2024-05-29 NOTE — Progress Notes [1]
 05/29/2024     Subjective:       Patient Reported Other  What topic(s) would you like to cover during your appointment?:  What flu I must have gotten or if I was just hit by multiple bugs while my immune system was weak.  Please describe the issue(s) and history with the issue (location, severity, duration, symptoms, etc.).:  Jan 16 in the evening, sudden N&V. So bad I pulled muscles. I RARELY let myself vomit and there was no holding this back. Low grade temp, muscle aches and malaise. No diarrhea. A week ago starting to really feel good after spinal fusion from 10/23. Sons visited from out of town and I probably overdid it. Also got 2 kittens; litterbox in my bathroom. Had cats all my life. 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 But this hasn?t been respiratory til recently a mild ST, cough, HA. Family has had URIs. Anyway, for three weeks I?ve had on and off Sx of fatigue, nausea, lower gut pain, muscle aches.  What has been done so far to take care of the issue(s)?:  Rest, fluids, electrolyter, probiotics, BRATTY diet with gradual increase to reg foods when tolerated.  What are your goals for this visit?:  To understand why it?s taking so long. The APRN from recent urgent care visit was surprised, but a friend and husband have had a similar illness for over a month.  Should I get a parasite study (new cats and litter box)? Should I have a chem panel to check electrolytes and LFTs? I?m really frustrated with this.    Cindy Randolph is a 64 y.o. female.  Follow Up, Abdominal Cramps, and Vomiting     Has history of left breast cancer s/p mastectomy in 2022, cervical spine radiculopathy, lumbar degenerative disease, osteoporosis, migraine, depression, OSA on CPAP and GERD with hiatal hernia.     Was seen on 05/23/2024 following a recent GI bug.  Has post GI symptoms for 2 weeks following a GI illness.  Unable to consume substantial meals with diet limited to bland foods.  Severe lower abdominal cramps occurring 2 hours postprandially.  No complaints of diarrhea or fevers.  Likely prolonged postinfectious gastroenteritis following a presumed viral illness.  Encourage initiation of probiotic regimen.  Consider trial of Tums or pantoprazole  for reflux symptoms.    Cindy Randolph is a 64 year old female who presents with persistent gastrointestinal symptoms following a viral infection.    About three weeks ago she had sudden-onset severe vomiting from a presumed viral gastroenteritis. Vomiting has resolved but she continues to have a rumbly, crampy stomach, intermittent nausea, muscle aches, headaches, and fatigue that feel more prolonged than prior illnesses.    She is now able to keep food down and has had no fevers. Abdominal cramps have improved and are manageable. Nausea occurs intermittently and improves with ondansetron , with a few doses remaining.    She had lumbar spine surgery on March 15, 2024, complicated by anemia and need for IV antibiotics. She recently stopped hydrocodone  after about six weeks of use. She now uses ibuprofen and Tylenol  for pain, but ibuprofen upsets her stomach. She occasionally uses 12.5 to 25 mg tramadol  for arthritis pain. She previously took low-dose naltrexone  2 mg before surgery.    During this illness she had two migraines, which she associates with feeling run down.            Objective:          acetaminophen  (  TYLENOL ) 325 mg tablet Take two tablets by mouth every 6 hours. Take scheduled for 3 days after surgery, then as needed. Do not exceed 4,000mg  in a 24 hour period. (Patient taking differently: Take two tablets by mouth as Needed. Take scheduled for 3 days after surgery, then as needed. Do not exceed 4,000mg  in a 24 hour period.)    aspirin  81 mg chewable tablet Chew one tablet by mouth daily.    biotin 1 mg cap Take one capsule by mouth daily.    calcium  carbonate (OS-CAL) 1250 mg (500 mg elemental calcium ) tablet Take one tablet by mouth daily. CHOLEcalciferoL (vitamin D3) 5000 unit tablet Take one tablet by mouth daily.    diclofenac  sodium (VOLTAREN ) 1 % topical gel Apply four g topically to affected area daily as needed.    ezetimibe  (ZETIA ) 10 mg tablet Take one tablet by mouth daily.    Fish,Bora,Flax Oils-OM3,6,9 #1 (TRIPLE OMEGA) 400-400-400 mg capsule Take one capsule by mouth at bedtime daily.    gabapentin  (NEURONTIN ) 100 mg capsule Take two capsules by mouth at bedtime daily.    HYDROcodone /acetaminophen  (NORCO) 5/325 mg tablet Take one tablet by mouth every 6 hours as needed for Pain.    inclisiran (LEQVIO ) 284 mg/1.5 mL injection syringe Inject 1.5 mL under the skin every 180 days. Initially then at 3 months and then every 6 months    iron  dextran complex (INFED  IJ) Inject  to area(s) as directed once.    methocarbamoL  (ROBAXIN ) 500 mg tablet Take one tablet by mouth three times daily.    metoprolol  succinate XL (TOPROL  XL) 50 mg extended release tablet Take 1.5 tablets by mouth at bedtime daily.    Miscellaneous [employer] misc DME requirements, rx to include the following typed information:    NPI: [id number]  Doctor's typed name: [name], MD  ICD 10: G47.33  Length of need: 99 months  Start date:02/14/2023  Signature date: 02/14/2023  Detailed equipment: CPAP machine with any accessories.    naltrexone  4.5 MG oral capsule (COMPOUND) Take one capsule by mouth daily.    ondansetron  (ZOFRAN  ODT) 4 mg rapid dissolve tablet Dissolve one tablet by mouth every 8 hours as needed for Nausea or Vomiting. Place on tongue to dissolve.    other medication Take one Dose by mouth daily. K Complete K1 & K2 as MK-4 & MK-7 : Take 1 softgel by mouth once daily    Selenium 100 mcg tab Take one tablet by mouth daily.    SUMAtriptan  succinate (IMITREX ) 25 mg tablet Take one tablet by mouth at onset of headache. May repeat after 2 hours if needed. Max of 200 mg in 24 hours.    SYNTHROID  100 mcg tablet Take one tablet by mouth daily 30 minutes before breakfast.    traMADoL  (ULTRAM ) 50 mg tablet Take one-half tablet by mouth every 12 hours as needed for Pain.    tretinoin  (RETIN-A ) 0.025 % topical cream Apply  topically to affected area at bedtime daily.    vit A/vit C/vit E/zinc/copper (PRESERVISION AREDS PO) Take 1 tablet by mouth daily.    vitamins, B complex tab Take one tablet by mouth daily.    vitamins, multiple cap Take one capsule by mouth daily.     Vitals:    05/29/24 1306   BP: 122/80   BP Source: Arm, Left Upper   Pulse: 74   Temp: 36.5 ?C (97.7 ?F)   SpO2: 95%   TempSrc: Oral   PainSc:  Zero   Weight: 72.2 kg (159 lb 3.2 oz)   Height: 159 cm (5' 2.6)     Body mass index is 28.56 kg/m?[name]     Physical Exam  Vitals reviewed.   HENT:      Head: Normocephalic and atraumatic.   Pulmonary:      Effort: Pulmonary effort is normal.   Abdominal:      General: There is no distension.      Palpations: Abdomen is soft. There is no mass.      Tenderness: There is no abdominal tenderness.   Skin:     General: Skin is warm and dry.   Neurological:      Mental Status: She is alert and oriented to person, place, and time.   Psychiatric:         Mood and Affect: Mood normal.         Behavior: Behavior normal.             Depression Screening:     Over the past 2 weeks, how often have you been bothered by any of the following problems?  Little interest or pleasure in doing things: (Patient-Rptd) Several days  Feeling down, depressed or hopeless: (Patient-Rptd) Several days   PHQ-2 Score: (Patient-Rptd) 2       Patient Health Questionnaire (PHQ)-9:   Trouble falling asleep, staying asleep, or sleeping too much: (Patient-Rptd) More than half the days  Feeling tired or having little energy: (Patient-Rptd) Nearly every day  Poor appetite or overeating: (Patient-Rptd) Nearly every day  Feeling bad about yourself - or that you're a failure or have let  yourself or your family down: (Patient-Rptd) Several days  Trouble concentrating on things, such as reading the newspaper or watching television: (Patient-Rptd) Not at all  Moving or speaking so slowly that other people could have noticed. Or, the opposite - being so fidgety or restless that you have been moving around a lot more than usual: (Patient-Rptd) Not at all  Thoughts that you would be better off dead or of hurting yourself in some way: (Patient-Rptd) Not at all    Patient Scores:  PHQ-2 Score: (Patient-Rptd) 2  PHQ-9 Score: (!) (Patient-Rptd) 11      If you checked off any problems,  If you checked off any problems, how difficult have these problems made it for you to do your work, take care of things at home, or get along with other people?: (Patient-Rptd) Somewhat difficult             BP Readings from Last 5 Encounters:   05/29/24 122/80   04/26/24 (!) 141/97   04/23/24 127/83   04/16/24 121/85   04/09/24 (!) 135/91        Wt Readings from Last 5 Encounters:   05/29/24 72.2 kg (159 lb 3.2 oz)   04/30/24 72.6 kg (160 lb)   04/26/24 68 kg (150 lb)   04/23/24 73.5 kg (162 lb)   04/16/24 72.6 kg (160 lb)        Health Maintenance   Topic Date Due    RSV VACCINE (50 years and Older/Pregnant Women) (1 - Risk 50-74 years 1-dose series) Never done    INFLUENZA VACCINE (1) 12/23/2023    COVID-19 VACCINE (1 - 2025-26 season) 05/29/2025 (Originally 01/23/2024)    MEDICARE ANNUAL WELLNESS VISIT  03/07/2025    DTAP/TDAP VACCINES (3 - Td or Tdap) 11/21/2025    CERVICAL CANCER SCREENING  10/12/2028    COLORECTAL CANCER SCREENING  10/15/2031    SHINGLES RECOMBINANT VACCINE  Completed    HIV SCREENING  Completed    PNEUMOCOCCAL VACCINE AGE 68 AND OVER  Completed    HEPATITIS C SCREENING  Completed    DEPRESSION SCREENING  Completed    HPV VACCINES  Aged Out    MENINGOCOCCAL B VACCINE  Aged Out    BREAST CANCER SCREENING  Discontinued               Assessment and Plan:    Problem   Mixed Hyperlipidemia   Chronic Low Back Pain With Bilateral Sciatica   Post-Surgical Hypothyroidism       Problem List Items Addressed This Visit CARDIAC AND VASCULATURE    Mixed hyperlipidemia    Follows with cardiology  Discontinued statin (crestor ) due to concern about muscle pain.   Currently on Zetia  10 mg daily and inclisiran.  -Continue current regimen.         Relevant Orders    LIPID PROFILE       ENDOCRINE AND METABOLIC    Post-surgical hypothyroidism    Follows with endocrinology  Well managed with Levothyroxine  100mg  daily         Relevant Orders    TSH WITH FREE T4 REFLEX       MUSCULOSKELETAL AND INJURIES    Chronic low back pain with bilateral sciatica    Previously managed with hydrocodone  post surgery. Ibuprofen causes gastrointestinal upset, advised to take with food. - Prescribed tramadol  for severe pain as needed.  - Advised taking ibuprofen with food to minimize gastrointestinal upset.         Relevant Medications    traMADoL  (ULTRAM ) 50 mg tablet     Other Visit Diagnoses         Acute gastroenteritis    -  Primary    Relevant Medications    ondansetron  (ZOFRAN  ODT) 4 mg rapid dissolve tablet    Other Relevant Orders    CBC AND DIFF (Completed)    COMPREHENSIVE METABOLIC PANEL      Fatigue, unspecified type        Relevant Orders    CBC AND DIFF (Completed)           Acute gastroenteritis  Recent viral gastroenteritis with nausea, vomiting, and abdominal cramping. Symptoms improved with dietary changes and probiotics. Occasional nausea persists, managed with ondansetron .  - Prescribed ondansetron  for nausea as needed.  - Advised continuation of dietary modifications including protein intake and probiotics.  - Ordered blood work to check electrolytes    Patient Instructions   Routine Clinic Information:    Make Checking in faster by using MyChart ahead of time by using the pre check in and complete the questionnaires. This will make your next visit Check in faster.      Please don't hesitate to call if you have any problems or questions.   My nurse, Raquel, RN, can be reached at [phone].  If she does not answer, please leave a voicemail as she is probably rooming other patients. Please leave your name, the spelling of your last name, date of birth, phone number they can call you back and a description of why you are calling. You may also message us  in MyChart.    Our fax number is [phone].     Medication refills:  Please use the MyChart Refill request or contact your pharmacy directly to request medication refills.  Please allow at least 3 business days for refill requests  .  Lab work:  The main lab is on the 1st floor of the [facility].  [address], [address]  [place], [place] [address]  LAB HOURS  Mon 7 a.m. - 6 p.m.  Tues 7 a.m. - 6 p.m.  Wed 7 a.m. - 6 p.m.  Thur 7 a.m. - 6 p.m.  Fri 7 a.m. - 6 p.m.  Sat 7 a.m. - noon  Sun Closed -  It is walk in only, they do not take appointments:  Other lab locations are available; please see [email or web address]    Test results:  You will receive your test results at your appointment, by MyChart (our patient portal), or via phone or letter.   We prefer to use MyChart as much as possible.  If you are expecting results and have not heard from my office within 2 weeks of your testing, please send a MyChart message or call my office.    As a part of the CARES act, starting 08/23/2019, some results will be released to mychart automatically.  With these changes you may see your results before I do.  Critical lab results will be addressed immediately, but otherwise please  give me 72 hours to view and respond to your results before reaching out with questions.      Radiology is on the 2nd floor of the [address], among several other locations.  Please contact the radiology department to schedule at [phone].    Scheduling is available directly through MyChart, or via phone at [phone].  Same day and urgent care appointments are available.    We offer same day appointments for your acute health concerns. These appointments are on a first come, first serve basis. Please call [phone] if you would like to make an appointment.   Appointment reminders may be received over the phone and by text.  Communication preferences can be managed in MyChart to ensure you receive important appointment notifications.    Support for many chronic illnesses is available through [employer]: [email or web address].no or [phone].    We offer Integrated Behavioral Health services, nutrition support, and pharmacist support services free of charge.  To schedule an appointment with a specialist and/or testing please call the central scheduling number at [phone].     You may see my nurse practitioner, Tinnie Shock, for urgent needs or if I am unavailable.  We are working as a team to provide better continuity and access to our patients.      If you ever have emergency symptoms of chest pain, shortness of breath or uncontrolled or unexplained pain, please go to your closest emergency room.  You can give us  an update after you have addressed any emergency.        For urgent issues after business hours/weekends/holidays call [phone] and request for the outpatient internal medicine physician to be paged.      Return in about 6 months (around 11/26/2024) for Follow-up [CODE 226] - 20 min visit.

## 2024-05-30 ENCOUNTER — Encounter: Admit: 2024-05-30 | Discharge: 2024-05-30 | Payer: MEDICARE

## 2024-05-30 DIAGNOSIS — E890 Postprocedural hypothyroidism: Secondary | ICD-10-CM

## 2024-05-30 DIAGNOSIS — K529 Noninfective gastroenteritis and colitis, unspecified: Principal | ICD-10-CM

## 2024-05-30 DIAGNOSIS — M5442 Lumbago with sciatica, left side: Secondary | ICD-10-CM

## 2024-05-30 DIAGNOSIS — G8929 Other chronic pain: Secondary | ICD-10-CM

## 2024-06-01 ENCOUNTER — Encounter: Admit: 2024-06-01 | Discharge: 2024-06-01 | Payer: MEDICARE

## 2024-06-18 ENCOUNTER — Encounter: Admit: 2024-06-18 | Discharge: 2024-06-18 | Payer: MEDICARE

## 2024-06-19 ENCOUNTER — Encounter: Admit: 2024-06-19 | Discharge: 2024-06-19 | Payer: MEDICARE

## 2024-06-19 ENCOUNTER — Ambulatory Visit: Admit: 2024-06-19 | Discharge: 2024-06-19 | Payer: MEDICARE

## 2024-06-19 VITALS — BP 120/81 | HR 72 | Ht 62.0 in | Wt 160.0 lb

## 2024-06-19 DIAGNOSIS — I10 Essential (primary) hypertension: Secondary | ICD-10-CM

## 2024-06-19 DIAGNOSIS — Z789 Other specified health status: Principal | ICD-10-CM

## 2024-06-19 DIAGNOSIS — E7841 Elevated Lipoprotein(a): Secondary | ICD-10-CM

## 2024-06-19 DIAGNOSIS — R931 Abnormal findings on diagnostic imaging of heart and coronary circulation: Secondary | ICD-10-CM

## 2024-06-19 MED ORDER — INCLISIRAN 284 MG/1.5 ML SC SYRG
284 mg | Freq: Once | SUBCUTANEOUS | 0 refills | Status: CP
Start: 2024-06-19 — End: ?
  Administered 2024-06-19: 17:00:00 284 mg via SUBCUTANEOUS

## 2024-06-19 NOTE — Progress Notes [1]
 Date of Service: 06/19/2024    Cindy [facility] is a 63 y.o. female.       HPI     She is a pleasant 64 year old female being seen for risk factor reduction.  She is followed by Dr. Fayette.  She has a history of breast cancer status post bilateral mastectomy, radiation, elevated CT calcium  score, family history of premature coronary artery disease, elevated LP(a), hypertension and dyslipidemia.  She has not tolerated rosuvastatin  even in very low doses she had significant myalgias and elevated CK level.    She tolerates Zetia .  LDL remained above goal.,  She started Leqvio .  LDL decreased to 91.  Most recently increased to 102 she is here for her third injection.      She has not had any anginal symptoms.  She denies orthopnea or PND.  EKG in November showed a new left bundle branch block.     Blood pressure has been optimally controlled.  She remains on metoprolol .  Amlodipine  was discontinued due to lower extremity swelling.      Father had MI in his 76s and bypass surgery.  She has 2 brothers with coronary disease and elevated LP(a).  1 brother  had coronary stent placement.          Objective   Vitals:    06/19/24 0957   BP: 120/81   BP Source: Arm, Right Upper   Pulse: 72   SpO2: 96%   O2 Device: None (Room air)   PainSc: Zero   Weight: 72.6 kg (160 lb)   Height: 157.5 cm (5' 2)     Body mass index is 29.26 kg/m?[facility]     Past Medical History  Patient Active Problem List    Diagnosis Date Noted    Complex care coordination 09/16/2023     Priority: High     Class: Acute    Anxiety 11/01/2023    Iron  deficiency 09/27/2023    Bilateral temporomandibular joint pain 09/26/2023    Restless leg syndrome 07/25/2023     She does report an itchy, creepy crawly sensation of her legs that keeps her up at night.   Prior to January, it was happening 4-5 times/night.   A few nights, it has been happening every night.   Usually lasts 10-15 minutes or so.   She has tried Gabapentin  and Lyrica  in the past (this was more so for fibromyalgia), but this gave her nightmares.       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   This was at [facility].   AHI 3% 12.9  AHI 4% 7.2  Was titrated to CPAP of 6 cm H2O.       Deformity of right hand 11/19/2022    Insomnia 09/29/2022    Memory changes 08/19/2022    At risk for cardiomyopathy 08/10/2022     dd AC from 10/3 to 04/06/2021, 240 Mmg/M2 adria.   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       Radiation-induced pulmonary fibrosis 06/22/2022    Recurrent major depression in full remission 06/22/2022     Was previously on lexapro  which she tolerated well.      Migraine with aura and without status migrainosus, not intractable 06/22/2022    Chronic pain of multiple joints 06/22/2022     X-ray right hand on 11/11/2022 showed severe degenerative arthritis at the first [facility], STT, and second DIP joints. Radial deviation of the second digit at the DIP joint.       Elevated coronary artery calcium  score 06/17/2022     03/06/2022- CT CAC Score [facility] : CAC= 136,  80th percentile rank for females age 110-60. Left main: 0 ,LAD: 109 ,LCX: 27 ,RCA: 0         Hiatal hernia with gastroesophageal reflux 06/17/2022     04/01/2022- Per OV Dr. Debby       Age-related osteoporosis without current pathological fracture 07/13/2021    Excess skin of breast 01/15/2021    Malignant neoplasm of left breast in female, estrogen receptor positive (CMS-HCC) 12/22/2020     DIAGNOSIS:  Left grade 1 ILC (ER  96%, PR 10%, HER2 0, Ki-67 3%) at 12:00 dx 11/2020     HISTORY:  Cindy [facility] is a female who presented to the [facility] on 12/24/2020 at age 43 for left breast cancer.  She reports noticing Left breast intermittent discomfort over the last 2 months.  She had Screening mammogram at [facility] on 11/25/2020 which identified a focal asymmetry in the left breast.  She returned for left diagnostic mammgoram and ultrasound on 12/04/2020 which showed an ill-defined hypoechoic area at 12:00 7 cm FTN measuring 1.7 cm.   She underwent Left ultrasound guided biopsy on 12/10/2020 which revealed grade 1 hormone positive, HER2 negative ILC with associated LCIS. She has no breast complaints.  She proceeded with Bilateral total mastectomy/Left SLNB with oncoplastic flat closure on 01/22/2021.  Final surgical pathology revealed multifocal grade 1 ILC that merge to measuring 7.4 cm with associated LCIS, clear margins and 5/5 lymph nodes. The right breast is benign.   Adjuvant chemotherapy and cALND were recommended.  She completed adjuvant chemotherapy of dd AC from 10/3 to 04/06/2021, taxol  was recommended but she declined.  She returned for Left completion ALND on 05/14/2021.  Final surgical pathology revealed 6/14 lymph nodes positive. She finished radiation with Dr. Buckner on 07/01/21.    BREAST IMAGING:  Mammogram:    - Screening mammogram 11/25/2020 ([facility]) revealed scattered fibroglandular tissue density.  Benign appearing microcalcification. Unchanged right upper central anterior breast low density circumscribed masses dating back to 2013.  Focal asymmetry located within the left posterior central breast, 12:00, 7.3 cm FTN most conspicuous on MLO.  Recommend spot compression.    - Left diagnostic mammogram 12/04/2020 ([facility]) revealed irregular density at 12:00 left breast persists.  Ultrasound recommended.    Ultrasound:    Left targeted ultrasound 12/04/2020 ([facility]) revealed an ill-defined area which was slightly hypoechoic at 12:00, 7 cm FTN.  This was indeterminate and biopsy was recommended.   MRI:    - Breast MRI 12/26/2020 () The breast tissue is scattered areas of fibroglandular tissue.   There is mild background  parenchymal enhancement. Left breast: There are multiple faint discontiguous irregular enhancing masses and discontiguous intervening nonmass enhancement involving predominantly the upper and inner left breast from anterior to posterior depth measuring in aggregate 6.3 cm AP by 3.6 cm transverse by 6.4 cm craniocaudal. Several areas demonstrate persistent enhancement kinetics. The artifact from tissue marker clips (ribbon and heart) at the site of biopsy demonstrating malignancy are seen within the upper left breast at the 12:00 position, middle depth (image 80). A representative discontiguous irregular enhancing mass is seen within the inner left breast at the 9:00 position, middle depth as on image 111, which demonstrates a mammographic correlate on outside CC tomosynthesis slice 18. Anterior extent of abnormal enhancement extends to the base of the left nipple. Posterior extent of abnormal enhancement is approximately 2.1 cm anterior to the underlying pectoralis muscle. There are greater than 5 small round morphologically abnormal and asymmetric level 1 left axillary lymph nodes. No suspicious level 2 or level 3 lymph nodes are seen. No suspicious internal mammary lymph nodes are seen. Right breast: No suspicious mass or nonmass enhancement is seen within the right breast. No suspicious right axillary or internal mammary lymph nodes are identified. Incidental Findings: None     REPRODUCTIVE HEALTH:  Age at first Menarche:  52  Age at First Live Birth:  25  Age at Menopause:  29  Gravida: 8  Para: 8  Breastfeeding:  yes    PROCEDURE:  Bilateral Total mastectomy/Left SLNB oncoplastic flat closure 01/22/2021 ([facility])  2. Left ALND 05/14/2021 [facility])  PATHOLOGY: multifocal grade 1 ILC that merge to measuring 7.4 cm with associated LCIS, clear margins and 5/5 lymph nodes. The right breast is benign.   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  No family history of ovarian or prostate cancer.   MEDICAL ONCOLOGY:    Dr. Kelby  transferred to Dr. Isidoro Adjuvant chemotherapy:  ddACx 4 completed 04/06/2021, Taxol  recommended but declined;   Present therapy:  Letrozole /Verzenio   REFERRED BY:  Dr. Bernardino Ned      Formatting of this note might be different from the original. Formatting of this note might be different from the original. DIAGNOSIS:  Left grade 1 ILC (ER 91-100, PR 11-20, HER 1+, Ki 67 2-5%) at 12:00 dx 11/2020 HISTORY:  Ms. Wheless is a female who presented to the [facility] on 12/24/2020 at age 63 for left breast cancer.  She reports noticing Left breast intermittent discomfort over the last 2 months.  She had Screening mammogram at [facility] on 11/25/2020 which identified a focal asymmetry in the left breast.  She returned for left diagnostic mammgoram and ultrasound on 12/04/2020 which showed an ill-defined hypoechoic area at 12:00 7 cm FTN measuring 1.7 cm.   She underwent Left ultrasound guided biopsy on 12/10/2020 which revealed grade 1 hormone positive, HER2 negative ILC with associated LCIS. She has no breast complaints. BREAST IMAGING: Mammogram:   - Screening mammogram 11/25/2020 ([facility]) revealed scattered fibroglandular tissue density.  Benign appearing microcalcification. Unchanged right upper central anterior breast low density circumscribed masses dating back to 2013.  Focal asymmetry located within the left posterior central breast, 12:00, 7.3 cm FTN most conspicuous on MLO.  Recommend spot compression.   - Left diagnostic mammogram 12/04/2020 ([facility]) revealed irregular density at 12:00 left breast persists.  Ultrasound recommended.  Ultrasound:  Left targeted ultrasound 12/04/2020 ([facility]) revealed an ill-defined area which was slightly hypoechoic at 12:00, 7  cm FTN.  This was indeterminate and biopsy was recommended. MRI:   - Breast MRI 12/26/2020 ([facility]) .... REPRODUCTIVE HEALTH: Age at first Menarche:  15 Age at First Live Birth:  76 Age at Menopause:  46 Gravida:  8 Para: 8 Breastfeeding:  yes PROCEDURE:  pending PERTINENT PMH:  Thyroid Cancer (1980- thyroidectomy), HTN FAMILY HISTORY:  Mother- Breast cancer (65).  No family history of ovarian or prostate cancer. PHYSICAL EXAM on PRESENTATION:   MEDICAL ONCOLOGY:    Dr. Kelby REFERRED BY:  Dr. Bernardino Ned      Hypermobility syndrome 05/24/2014    Degenerative disc disease, lumbar 02/21/2014    Primary hypertension 11/13/2007     Hypertension with pregnancy initially but sustained after 8th pregnancy.          Review of Systems   Constitutional: Negative.   HENT: Negative.     Eyes: Negative.    Cardiovascular:  Positive for palpitations.   Respiratory: Negative.     Endocrine: Negative.    Hematologic/Lymphatic: Negative.    Skin: Negative.    Musculoskeletal: Negative.    Gastrointestinal: Negative.    Genitourinary: Negative.    Neurological: Negative.    Psychiatric/Behavioral: Negative.     Allergic/Immunologic: Negative.        Physical Exam   Vitals reviewed.  Constitutional: She appears well-developed. No distress.   HENT:   Head: Normocephalic.   Cardiovascular: Normal rate and regular rhythm.   Pulmonary/Chest: Effort normal. No respiratory distress.   Abdominal: Normal appearance.   Musculoskeletal:         General: Normal range of motion.      Cervical back: Normal range of motion.   Neurological: She is alert and oriented to person, place, and time.   Skin: Skin is warm and dry.   Psychiatric: Her behavior is normal. Judgment and thought content normal.         Cardiovascular Studies    Cardiovascular Health Factors  Vitals BP Readings from Last 3 Encounters:   06/19/24 120/81   05/29/24 122/80   04/26/24 (!) 141/97     Wt Readings from Last 3 Encounters:   06/19/24 72.6 kg (160 lb)   05/29/24 72.2 kg (159 lb 3.2 oz)   04/30/24 72.6 kg (160 lb)     BMI Readings from Last 3 Encounters:   06/19/24 29.26 kg/m?   05/29/24 28.56 kg/m?   04/30/24 28.34 kg/m?      Smoking Tobacco Use History[1]   Lipid Profile Cholesterol   Date Value Ref Range Status   06/19/2024 179 <200 mg/dL Final     HDL   Date Value Ref Range Status   06/19/2024 63 >40 mg/dL Final     LDL   Date Value Ref Range Status   06/19/2024 102.04 (H) <100.00 mg/dL Final     Triglycerides   Date Value Ref Range Status   06/19/2024 126 <150 mg/dL Final      Blood Sugar Hemoglobin A1C   Date Value Ref Range Status   12/08/2023 5.5 4.0 - 5.7 % Final     Comment:     The ADA recommends that most patients with type 1 and type 2 diabetes maintain an A1c level <7%.     Glucose   Date Value Ref Range Status   05/29/2024 97 70 - 100 mg/dL Final   87/97/7974 897 (H) 70 - 100 mg/dL Final   92/96/7974 876 (H) 70 - 100 mg/dL Final     Glucose  Fasting   Date Value Ref Range Status   06/19/2024 92 70 - 100 mg/dL Final   92/83/7974 89 70 - 100 mg/dL Final   95/95/7974 898 (H) 70 - 100 mg/dL Final         Problems Addressed Today  Encounter Diagnoses   Name Primary?    Statin intolerance Yes    Elevated coronary artery calcium  score     Primary hypertension     Elevated lipoprotein(a)        Assessment and Plan     Elevated CT calcium  score.  Elevated LP(a).  Family history premature coronary artery disease.  Will continue with risk factor reduction.  She is currently without anginal symptoms.  She will continue to follow with Dr. Fayette as planned.  Continue with ezetimibe  and Leqvio      Dyslipidemia with statin intolerance.  Untreated LDL 138.  APO B187.  LDL goal less than 65.  She did not tolerate even a low-dose of 5 mg of rosuvastatin .  She had elevated CK levels.  Symptoms have resolved with discontinuing statin.  She remains on Zetia .  LDL decreased to 91 after her first Leqvio  injection.  Most recently 102.  She is here for her third injection.  Will update a lipid panel in 6 months.      Hypertension is optimally controlled.       A1c 5.6 when checked in December     BMI of 29 remains slightly elevated continue with heart healthy nutrition balanced meals and portion control and exercise     25-hydroxy vitamin D is 52 is optimal    Left bundle branch block on EKG in November.  She had an echocardiogram November 17 ejection fraction 56%.  Wall thickness mildly increased.  Preserved systolic function.  GLS slightly more impaired at -13% compared to prior study at -22%            Current Medications (including today's revisions)   acetaminophen  (TYLENOL ) 325 mg tablet Take two tablets by mouth every 6 hours. Take scheduled for 3 days after surgery, then as needed. Do not exceed 4,000mg  in a 24 hour period. (Patient taking differently: Take two tablets by mouth as Needed. Take scheduled for 3 days after surgery, then as needed. Do not exceed 4,000mg  in a 24 hour period.)    aspirin  81 mg chewable tablet Chew one tablet by mouth daily.    biotin 1 mg cap Take one capsule by mouth daily.    calcium  carbonate (OS-CAL) 1250 mg (500 mg elemental calcium ) tablet Take one tablet by mouth daily.    CHOLEcalciferoL (vitamin D3) 5000 unit tablet Take one tablet by mouth daily.    diclofenac  sodium (VOLTAREN ) 1 % topical gel Apply four g topically to affected area daily as needed.    ezetimibe  (ZETIA ) 10 mg tablet Take one tablet by mouth daily.    Fish,Bora,Flax Oils-OM3,6,9 #1 (TRIPLE OMEGA) 400-400-400 mg capsule Take one capsule by mouth at bedtime daily.    gabapentin  (NEURONTIN ) 100 mg capsule Take two capsules by mouth at bedtime daily.    inclisiran (LEQVIO ) 284 mg/1.5 mL injection syringe Inject 1.5 mL under the skin every 180 days. Initially then at 3 months and then every 6 months    metoprolol  succinate XL (TOPROL  XL) 50 mg extended release tablet Take 1.5 tablets by mouth at bedtime daily.    Miscellaneous Medical Supply misc DME requirements, rx to include the following typed information:    NPI: [id number]  Doctor's typed name: [name], MD  ICD 10: G47.33  Length of need: 99 months  Start date:02/14/2023  Signature date: 02/14/2023  Detailed equipment: CPAP machine with any accessories.    naltrexone  4.5 MG oral capsule (COMPOUND) Take one capsule by mouth daily.    ondansetron  (ZOFRAN  ODT) 4 mg rapid dissolve tablet Dissolve one tablet by mouth every 8 hours as needed for Nausea or Vomiting. Place on tongue to dissolve.    other medication Take one Dose by mouth daily. K Complete K1 & K2 as MK-4 & MK-7 : Take 1 softgel by mouth once daily    Selenium 100 mcg tab Take one tablet by mouth daily.    SUMAtriptan  succinate (IMITREX ) 25 mg tablet Take one tablet by mouth at onset of headache. May repeat after 2 hours if needed. Max of 200 mg in 24 hours.    SYNTHROID  100 mcg tablet Take one tablet by mouth daily 30 minutes before breakfast.    traMADoL  (ULTRAM ) 50 mg tablet Take one-half tablet by mouth every 12 hours as needed for Pain.    tretinoin  (RETIN-A ) 0.025 % topical cream Apply  topically to affected area at bedtime daily.    vit A/vit C/vit E/zinc/copper (PRESERVISION AREDS PO) Take 1 tablet by mouth daily.    vitamins, B complex tab Take one tablet by mouth daily.    vitamins, multiple cap Take one capsule by mouth daily.                 [1]   Social History  Tobacco Use   Smoking Status Never    Passive exposure: Past   Smokeless Tobacco Never   Tobacco Comments    Significant second hand smoke from parents 68-59 years old

## 2024-06-19 NOTE — Progress Notes [1]
Patient name and date of birth verified.     Injection administered in LLQ without complication. Pt tolerated injection well.     All questions answered. Patient verbalized understanding.

## 2024-06-26 ENCOUNTER — Encounter: Admit: 2024-06-26 | Discharge: 2024-06-26 | Payer: MEDICARE

## 2024-06-26 NOTE — Progress Notes [1]
 Attempted to contact patient for routine ccm call, left vm message requesting return call back.

## 2024-06-28 ENCOUNTER — Ambulatory Visit: Admit: 2024-06-28 | Discharge: 2024-06-28 | Payer: MEDICARE

## 2024-06-28 ENCOUNTER — Encounter: Admit: 2024-06-28 | Discharge: 2024-06-28 | Payer: MEDICARE
